# Patient Record
Sex: Male | Born: 1940 | Race: White | Hispanic: No | Smoking: Current every day smoker
Health system: Southern US, Community
[De-identification: ages and names within clinical notes are randomized; demographics above are authoritative.]

## PROBLEM LIST (undated history)

## (undated) DIAGNOSIS — Z9289 Personal history of other medical treatment: Secondary | ICD-10-CM

## (undated) DIAGNOSIS — G43909 Migraine, unspecified, not intractable, without status migrainosus: Secondary | ICD-10-CM

## (undated) DIAGNOSIS — Z9981 Dependence on supplemental oxygen: Secondary | ICD-10-CM

## (undated) DIAGNOSIS — I219 Acute myocardial infarction, unspecified: Secondary | ICD-10-CM

## (undated) DIAGNOSIS — G709 Myoneural disorder, unspecified: Secondary | ICD-10-CM

## (undated) DIAGNOSIS — G8929 Other chronic pain: Secondary | ICD-10-CM

## (undated) DIAGNOSIS — J189 Pneumonia, unspecified organism: Secondary | ICD-10-CM

## (undated) DIAGNOSIS — I10 Essential (primary) hypertension: Secondary | ICD-10-CM

## (undated) DIAGNOSIS — M545 Low back pain, unspecified: Secondary | ICD-10-CM

## (undated) DIAGNOSIS — I2510 Atherosclerotic heart disease of native coronary artery without angina pectoris: Secondary | ICD-10-CM

## (undated) DIAGNOSIS — T63691A Toxic effect of contact with other venomous marine animals, accidental (unintentional), initial encounter: Secondary | ICD-10-CM

## (undated) DIAGNOSIS — Z720 Tobacco use: Secondary | ICD-10-CM

## (undated) DIAGNOSIS — Z87442 Personal history of urinary calculi: Secondary | ICD-10-CM

## (undated) DIAGNOSIS — E7800 Pure hypercholesterolemia, unspecified: Secondary | ICD-10-CM

## (undated) DIAGNOSIS — J449 Chronic obstructive pulmonary disease, unspecified: Secondary | ICD-10-CM

## (undated) DIAGNOSIS — R918 Other nonspecific abnormal finding of lung field: Secondary | ICD-10-CM

## (undated) DIAGNOSIS — R0600 Dyspnea, unspecified: Secondary | ICD-10-CM

## (undated) DIAGNOSIS — C801 Malignant (primary) neoplasm, unspecified: Secondary | ICD-10-CM

## (undated) HISTORY — PX: COLONOSCOPY: SHX174

## (undated) HISTORY — PX: APPENDECTOMY: SHX54

## (undated) HISTORY — PX: CARDIAC CATHETERIZATION: SHX172

## (undated) HISTORY — PX: TOOTH EXTRACTION: SHX859

## (undated) HISTORY — PX: JOINT REPLACEMENT: SHX530

## (undated) HISTORY — PX: CATARACT EXTRACTION, BILATERAL: SHX1313

## (undated) HISTORY — PX: TOTAL KNEE ARTHROPLASTY: SHX125

## (undated) HISTORY — PX: TONSILLECTOMY: SUR1361

---
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
## 2015-05-21 ENCOUNTER — Ambulatory Visit

## 2015-05-21 NOTE — Telephone Encounter (Signed)
Phone Note -     Patient    Call back at [name] [phone]  Initial call taken by: [name],  May 21, 2015 10:09 AM  Initial Details of Call:  Patient called asking for a refill of his oxycodone. He would like to pick up the prescription on Thursday 04/27. PMP has been printed and giving to Dr. Vianne Bulls  The [place] on-line prescription monitoring program was reviewed prior to refilling this prescription and no inappropriate controlled subtances were dispensed.      Previous Appointment:  03/16/2015  Rusty Aus MD (BP ck)    02/28/2015  Rusty Aus MD (BP ck)    02/27/2015  Onnie Graham MD (.HPI)    10/23/2014  Onnie Graham MD (.HPI)        Next Appointment:  06/19/2015  [facility]  2:00 PM  06/29/2015  [name]  8:15 AM      Last BP:  128/68   (03/16/2015)    Last HGBA1C:      ()    BMP/CMP  Last BUN:     21    (02/27/2015),                Last EGFR:     65    (02/27/2015)  Last Creatinine:     1.1    (02/27/2015),      Last Sodium:   142    (02/27/2015)       Last Potassium:     4.5    (02/27/2015)    Last  Cholesterol:     141    (02/27/2015)     Last  LDL:     77    (02/27/2015),    Last  LDL Direct:     ()    Last  SGPT ALT:     16    (02/27/2015),  Last Albumin:     4.2    (02/27/2015)    CBC  Last WBC:     13.5    (12/05/2014)  Last Hgb:     14.6    (12/05/2014)  Last Platelet Count:     467    (12/05/2014)    Last Digoxin Level:        ()    Last TSH:       (),  Last TSH Ultra:   2.42    (06/25/2012)      Last Urine Drug Screen: on 02/27/2015.  Narcotic Medications:  OXYCODONE-ACETAMINOPHEN 7.5-325 MG TABS (OXYCODONE-ACETAMINOPHEN)  - Started: 01/24/2005    OXYCODONE-ACETAMINOPHEN 7.5-325 MG TABS (OXYCODONE-ACETAMINOPHEN)  - Started: 09/21/2006    OXYCODONE-ACETAMINOPHEN 7.5-325 MG TABS (OXYCODONE-ACETAMINOPHEN)  - Started: 07/06/2009    OXYCODONE-ACETAMINOPHEN 7.5-325 MG TABS (OXYCODONE-ACETAMINOPHEN)  -  Started: 09/12/2009    OXYCODONE-ACETAMINOPHEN 7.5-325 MG TABS (OXYCODONE-ACETAMINOPHEN)  - Started: 09/12/2009  - Stopped: 10/13/2013   OXYCODONE HCL 10 MG TABS (OXYCODONE HCL)  - Started: 10/13/2013    OXYCODONE HCL 10 MG TABS (OXYCODONE HCL)  - Started: 10/13/2013        Directives/Controlled Substance Agreement 03/25/2013 PATIENT WISHES TO BE DNR  06/24/2013 HEALTH CARE PROXY IS FRIEND SUSAN MACNULTY - NO PAPERWORK ON FILE  10/13/2013 CONTROLLED SUBSTANCE AGREEMENT ON FILE KD    PMP Appropriate __YES

## 2015-05-22 ENCOUNTER — Ambulatory Visit

## 2015-05-24 ENCOUNTER — Ambulatory Visit: Admitting: Internal Medicine

## 2015-05-28 ENCOUNTER — Ambulatory Visit

## 2015-06-12 ENCOUNTER — Ambulatory Visit

## 2015-06-18 ENCOUNTER — Ambulatory Visit

## 2015-06-18 NOTE — Telephone Encounter (Signed)
Phone Note -     Patient    Initial call taken by: Arther Dames,  Jun 18, 2015 10:52 AM  Initial Details of Call:  Patient calling to request refill on oxycodone-acetaminophen and is asking to pick up prescription on Thursday.      Follow-up #1  Details: The [place] on-line prescription monitoring program was reviewed prior to refilling this prescription and no inappropriate controlled subtances were dispensed.    By: Luvenia Redden LPN ~ Jun 18, 2015 11:21 AM

## 2015-06-19 ENCOUNTER — Ambulatory Visit

## 2015-06-19 NOTE — Telephone Encounter (Signed)
Phone Note -     Outgoing Call    Initial call taken by: Jacolyn Reedy,  Jun 19, 2015 2:48 PM  Call placed to: Patient  Detail: MISED APPT  Summary of Call: Pt had an appt today 06/19/15 with Dr. Janeann Forehand and did not show .[name] I callled and left a message and will follow up with a letter

## 2015-06-19 NOTE — Progress Notes (Signed)
[facility]   [address]   [place], [place] [address]  Office: [phone] Fax: [phone]                    06/19/2015    Isaiah White  [address]   [place], [place]  [address]      Dear [name]:    We had an appointment reserved for you today, and we were sorry  not to see you.    Since the doctor felt it was important to see you, please call our office  as soon as it is convenient so we may reschedule your appointment.      Sincerely,  Scientist, research (physical sciences) [facility]

## 2015-06-21 ENCOUNTER — Ambulatory Visit

## 2015-06-21 ENCOUNTER — Ambulatory Visit: Admitting: Internal Medicine

## 2015-06-28 ENCOUNTER — Ambulatory Visit

## 2015-06-28 ENCOUNTER — Ambulatory Visit: Admitting: Internal Medicine

## 2015-06-28 NOTE — Progress Notes (Signed)
he had cardiology consult for increase chest pain episodes  neg stress test and echo  i increase his cardizem for bp  Did you self refer to any specialists?    Age: 75 Years Old  Last Secure Message:  07/09/2011 - Secure Messaging: Email: [facility] OFFIE NOTE  Last PIN change:           Labs   No eye exam in record! LDL 77 on 01/31/2017HGBA1C =       Most Recent Visits     (Last rx/phone contact: 06/28/2015 )        03/16/2015  Rusty Aus MD (BP ck)    02/28/2015  Rusty Aus MD (BP ck)    02/27/2015  Onnie Graham MD (.HPI)    10/23/2014  Onnie Graham MD (.HPI)                                      Next Appointment(s) 06/29/2015  [facility]  8:15 AM      Immunizations  No flu vaccine this season      PCV13    03/25/2013      Td Booster 03/20/2008 Adacel/TDAP 10/25/2012  Last Flu  10/23/2014    Pneumovax  09/28/1995  12/18/2011    TD:   Adacel on 10/25/2012           Bone Density:  Date of Exam:    Colonoscopy: divertics. Date of Exam:  12/17/2006.  FIT Test:  Date:  .  Hemoccult: Negative Date:  06/24/2013.    Protocols Due  ZOSTAVAX.           Directives 03/25/2013 PATIENT WISHES TO BE DNR  06/24/2013 HEALTH CARE PROXY IS FRIEND SUSAN MACNULTY - NO PAPERWORK ON FILE  10/13/2013 CONTROLLED SUBSTANCE AGREEMENT ON FILE KD    The above represents the pre-visit preparations conducted for this patient.

## 2015-06-29 ENCOUNTER — Ambulatory Visit

## 2015-06-29 ENCOUNTER — Ambulatory Visit: Admitting: Internal Medicine

## 2015-06-29 LAB — HX COMPREHENSIVE METABOLIC PANELX
HX ALBUMIN: 3.9 g/dL (ref 3.2–4.9)
HX ALKALINE PHOSPHATASE: 130 U/L — ABNORMAL HIGH (ref 30.0–117.0)
HX ALT: 14 U/L (ref 0.0–40.0)
HX ANION GAP: 15 mmol/L (ref 9.0–19.0)
HX AST: 15 U/L (ref 0.0–37.0)
HX BICARBONATE: 24 mmol/L (ref 23.0–31.0)
HX BUN/CREAT RATIO: 13 (ref 12.0–20.0)
HX BUN: 14 mg/dL (ref 8.0–23.0)
HX CALCIUM: 9.3 mg/dL (ref 8.5–10.5)
HX CHLORIDE: 101 mmol/L (ref 98.0–110.0)
HX CREATININE: 1.1 mg/dL (ref 0.4–1.2)
HX GLOMERULAR FILTRATION RATE: 65
HX GLUCOSE: 110 mg/dL — ABNORMAL HIGH (ref 70.0–100.0)
HX HEMOLYSIS INDEX: 1 mg/dL (ref 0.0–50.0)
HX ICTERIC INDEX: 1 (ref 0.0–2.0)
HX LIPEMIC INDEX: 7 mg/dL (ref 0.0–40.0)
HX POTASSIUM: 4.8 mmol/L (ref 3.6–5.3)
HX SODIUM: 140 mmol/L (ref 137.0–146.0)
HX TOTAL BILIRUBIN: 0.4 mg/dL (ref 0.2–1.2)
HX TOTAL PROTEIN: 7.3 g/dL (ref 6.5–8.4)

## 2015-06-29 LAB — HX CBC W/DIFFX
HX ABSOLUTE BASOPHILS COUNT: 0.07 10*3/uL (ref 0.0–0.33)
HX ABSOLUTE EOSINOPHIL COUNT: 0.25 10*3/uL (ref 0.0–0.88)
HX ABSOLUTE LYMPHS COUNT: 3.04 10*3/uL (ref 0.99–4.84)
HX ABSOLUTE MONOCYTES COUNT: 0.76 10*3/uL (ref 0.18–1.21)
HX ABSOLUTE NEUTROPHIL CNT: 7.72 10*3/uL — ABNORMAL HIGH (ref 1.8–7.7)
HX BASOS: 0.6 %
HX EOS: 2.1 %
HX HEMATOCRIT: 43.4 % (ref 41.0–53.0)
HX HEMOGLOBIN: 13.8 g/dL (ref 13.5–17.5)
HX IMMATURE GRANULOCYTES: 0.5 % (ref 0.0–1.0)
HX LYMPHS: 25.5 %
HX MEAN CORP.HEMO.CONC.: 31.8 g/dL (ref 31.0–37.0)
HX MEAN CORPUSCULAR HEMOGLOBIN: 29.9 pg (ref 26.0–34.0)
HX MEAN CORPUSCULAR VOLUME: 93.9 fL (ref 80.0–100.0)
HX MEAN PLATELET VOLUME: 11 fL (ref 9.4–12.4)
HX MONOS: 6.4 %
HX PLATELET COUNT: 526 10*3/uL — ABNORMAL HIGH (ref 150.0–400.0)
HX POLYS: 64.9
HX RED BLOOD COUNT: 4.6 M/uL (ref 4.5–5.9)
HX RED CELL DISTRIBUTION WIDTH SD: 51.1 fL — ABNORMAL HIGH (ref 35.0–51.0)
HX WHITE BLOOD COUNT: 11.9 10*3/uL — ABNORMAL HIGH (ref 4.5–11.0)

## 2015-06-29 LAB — HX RTN. URINE WITH REFLEX
HX ASCORBIC ACID URINE: NEGATIVE
HX URINE BILE: NEGATIVE
HX URINE BLOOD: NEGATIVE
HX URINE ESTERASE: NEGATIVE
HX URINE GLUCOSE: NEGATIVE
HX URINE KETONES: NEGATIVE
HX URINE NITRITE: NEGATIVE
HX URINE PH: 6 (ref 5.0–8.0)
HX URINE PROTEIN: NEGATIVE
HX URINE SPECIFIC GRAVITY: 1.019 (ref 1.003–1.03)

## 2015-06-29 LAB — HX LIPID PANEL FASTINGX
HX CHD RISK ASSESMENT FACTORX: 3.1
HX CHOLESTEROL (LIPR): 134 mg/dL (ref <200)
HX HDL CHOLESTEROLX: 43 mg/dL (ref 35.0–55.0)
HX LDL CHOLESTEROLX: 73 mg/dL (ref <130)
HX NON HDL CHOLESTEROLX: 91 mg/dL (ref <130)
HX TRIGLYCERIDES: 92 mg/dL (ref <150)

## 2015-06-29 LAB — HX TOTAL IRON BINDING CAPACITYX
HX % SATURATION: 25 % (ref 20.0–50.0)
HX IRONX: 62 ug/dL (ref 45.0–160.0)
HX TOTAL IBC: 250 ug/dL (ref 228.0–428.0)
HX UNBOUND IRON BINDING CAPACITY: 188 ug/dL (ref 112.0–347.0)

## 2015-06-29 LAB — HX FERRITIN: HX FERRITIN: 87 ng/mL (ref 30.0–400.0)

## 2015-06-29 LAB — HX SED RATE: HX SED RATE: 61 mm/h — ABNORMAL HIGH (ref 0.0–20.0)

## 2015-06-29 NOTE — Progress Notes (Signed)
Primary Provider:  Onnie Graham MD      History of Present Illness:  the patient is here for annual comprehensive visit, including management and coordination of care of the problems below:    we reviewed self referrals to any new specialists: none.    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  so he reduced his cardizem back to 240mg .      Current Allergies:  1)  TAGAMET (Severe)  2)  * SPIRIVA (Moderate)  3)  * LOVASTATIN (Moderate)    Current Meds:    CARDIZEM CD 240 MG XR24H-CAP (DILTIAZEM HCL COATED BEADS) 1 tab q am for bp  METOPROLOL SUCCINATE 25 MG XR24H-TAB (METOPROLOL SUCCINATE) take one by mouth each day  SIMVASTATIN 10 MG TABS (SIMVASTATIN) 1 po once daily in evening for cholesterol  FERROUS SULFATE 325 MG TABS (FERROUS SULFATE) 1 by mouth daily;   buy "over the counter"  OXYCODONE HCL 10 MG TABS (OXYCODONE HCL) 1 tab every 8 hours for severe pain. partial fill is available.  GABAPENTIN 100 MG CAPS (GABAPENTIN) 2 tab two times daily to reduce pain  ISOSORBIDE MONONITRATE 20 MG TABS (ISOSORBIDE MONONITRATE) 1/2 tab in am and repeat after 8 hours two times daily  METOCLOPRAMIDE HCL 5 MG  TABS (METOCLOPRAMIDE HCL) 1 tab  before meals and at bedtime  OMEPRAZOLE 20 MG CPDR (OMEPRAZOLE) 1 tab two times daily for acid OTC  NORTRIPTYLINE HCL 10 MG CAPS (NORTRIPTYLINE HCL) 3 tab hs to reduce nerve pain from back  ASPIRIN 81 MG TBEC (ASPIRIN) take one by mouth daily  ONE-A-DAY MENS TABS (MULTIPLE VITAMIN)   NITROGLYCERIN 0.3 MG SUBL (NITROGLYCERIN) 1 po prn chest pain, repeat q5 minutes x2 for total of 3 doses prn  * CONTROLLED SUBSTANCE AGREEMENT ON FILE   FLUOCINONIDE 0.05 % CREA (FLUOCINONIDE) apply to rash on arms two times daily for 10 days    he drives does some errands most days.  he might be walking around in each store for ~ 15 minutes.  for total of 45 minutes of shopping  no other sustained walking.  limited by back pain and then short of breath.    no special diet.      Current Directives- Reviewed during today's visit  PATIENT WISHES TO BE DNR  HEALTH CARE PROXY IS FRIEND SUSAN MACNULTY - NO PAPERWORK ON FILE  CONTROLLED SUBSTANCE AGREEMENT ON FILE KD    Past Medical History  atypical chest pain most likely due to esophageal spasm.  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      Surgical History  bilat cataracts: 8/14  arthroscopic surgery left knee- dr Frederick Peers 12/09, 9/10, 9/11, 1/13  tonsils and appendix  several fatty growths removed from back and left arm by dr Freida Busman  4 epidural steroid injections 2006/2007  Social History  retired from remodeling carpentry  lives alone in apartment.   he has a lady friend at the lodge  rustproofer of autos.  no special diet  wife died unexpectedly Jan 26, 2023  he is estranged from his son  active at the [employer]    Risk Factors  Smoking Status:current daily smoker  Type: cigarettes   Smoking Comments: tobacco consumption depends on how much pain or how nervous he is  Amount: 1/2 pack a day  Counseled to quit/cut down: Yes  Passive smoke exposure:  Yes    Drug use: no  Alcohol use: previous  Alcohol Use Comments: he remains alcohol free since 1995    HIV high risk behavior: no  Family History MI in male age < 78: yes  Last Colonoscopy:   divertics. (12/17/2006 7:50:18 AM)        Review of Systems   General: rates energy as 8/10.  knee pain sometimes wakes him at night when he rolls over.    Eyes: Denies visual change or blurring, double vision. last eye exam < 2 years.    Ears/Nose/Throat: Denies ringing in the ears, earache.   Cardiovascular: Complains of shortness of breath. Denies palpitations. he gets chest pain with 20 minutes of activity.  he typically rests and goes away in 20-30 minutes.  rarely using NTG  no edema  Respiratory: Denies wheezing. morning cough with clear phlegm  Gastrointestinal: Denies nausea, vomiting, diarrhea, abdominal pain, abdominal bloating, constipation, mucous or blood in stools.   Genitourinary: Denies decreased urinary stream, urinary hesitancy. nocturia x 2  Musculoskeletal: knee brace helps him when he is walking.  due to instability  Skin: Denies rash.   Neurologic: Complains of parasthesias. Denies headaches, seizures, transient weakness, general weakness. numbness along outter aspect of left knee  he had 3 near falls in may due to knee instability.    Psychiatric: Denies anxiety, depression.   Endocrine: Denies flushing, polyuria, polydipsia.   Heme/Lymphatic: Denies easy bruising, unusual bleeding.     All other pertinent systems were reviewed and are negative   Vital Signs     Weight: 169.20 lb. Height: 72  in.    BMI: 23.03  BSA: 1.99    Wt chg: -8.80  Weight: 178 lbs   BMI: 24.23 on 03/16/2015  Temperature: 97.81 deg F.     Temp Site: oral    Pulse rate: 84    Pulse rhythm: regular  Pulse Ox (SpO2): 96  BP: 120/84, sitting left arm      Medications and Allergies Reviewed    Signed: Kristen D'Apolito LPN....June 29, 2015 8:07 AM  PHQ 2    Over the last 2 weeks, how often have you been bothered by any of the  following problems?  1. Little interest or pleasure in doing things:  0   - Not at all  2. Feeling down, depressed, or hopeless:  0   - Not at all        Physical Exam    General:      well developed, well nourished, in no acute distress.    Additional Exam:      Skin- some actinic changes on the forehead and hands.  TMs-normal bilaterally  Oropharynx-mild posterior injection without lesion, erythema or exudate  Neck-no bruit, adenopathy, or palpable thyroid abnormality.  Lungs-decreased breath sounds throughout, without wheeze, rales, or signs of consolidation.  Cor- heart rhythm is regular.  no  click, murmur, S3, or S4 appreciated.  Abdomen-normal bowel sounds present.  No mass, tenderness, or bruit appreciated.  Rectal-without palpable mass.  Stool is guaiac-negative.  Prostate-with no palpable enlargement.  There are no palpable masses.  Genital-there is no scrotal mass or hernia.  Extremities-there is no peripheral edema.  There is no discoloration.  Joints-there is mild deformity of both knees.  Neuro-there are 1+ DTRs in the upper extremities and I could not elicit DTRs in the lower extremities.  Patient is oriented x 3. there is some short-term memory deficits.  Psych-patient has appropriate mood and affect.  Patient answers all questions appropriately.    Test Results:     WBC:     13.5    (12/05/2014)  Hemoglobin:    14.6    (12/05/2014)  Hematocrit:    43.3    (12/05/2014)  PLatelets:    467    (12/05/2014)  TIBC:     252    (12/05/2014)  Ferritin:    96    (12/05/2014)  AST:     18    (02/27/2015)  ALT:     16    (02/27/2015)  LDL:     77    (02/27/2015)  HDL:     46    (02/27/2015)  Glucose:    97    (02/27/2015)  BUN:     21    (02/27/2015)  Creatinine:    1.1    (02/27/2015)  PSA:     0.56    (06/24/2013)       Assessment and Plan:        ~ CHEST PAIN, ATYPICAL (R07.89)   COPD (J44.9)   HIGH CHOLESTEROL (E78.70)   SYSTOLIC HYPERTENSION (I10)   THROMBOCYTOSIS (D47.3)   ABUSE, ALCOHOL, IN REMISSION  (F10.21)    Patient had workup several months ago with cardiology for an escalating pattern of exertional chest discomfort.  Pharmacologic stress test was unremarkable, and echocardiogram was normal.  His systolic blood pressure was elevated so cardiologist put him on a low dose of metoprolol succinate and after that he was noticing that he was feeling dizzy about an hour after his morning blood pressure meds so he self selected bback to Cardizem 240 mg a day.  Blood pressure is under good control at his visit today.  Lipids have been at goal with normal liver blood work.  Activities are limited first by back pain and then by some dyspnea on exertion.  He has some chronic thrombocytosis and had borderline iron stores so he has been on iron supplement with iron levels monitored every 6 months.  He remains on oxycodone for long-term management of chronic degenerative disc disease pain in the back and chronic pain in the left knee.  Alcohol abuse remains in remission.  No progress in smoking cessation or reduction and no motivation towards this goal.    He is requesting some help at home to manage his housekeeping as he did not meet some standard within the housing for cleanliness.  He is asking for a letter to say that he has medical conditions that limited his ability to keep his house kept up.  I am noticing a little bit of short-term memory issue having very little recollection her detail about visits to specialists including the cardiologist, ophthalmologist and hematologist.  I'm suspecting that there is some early dementia here.we contacted both his eye Dr. Larena Sox is dermatologist to try to get him updated appointments but apparently he  is in collections for nonpayment.  He promises to resolve this and schedule these appointments.  no exacerbations of COPD over this interval.    His lab work is up-to-date.  I made no specific changes to his regimen today  patient does not require any additional self managment  tools to manage chronic health conditions.  I would see him routinely in 3 months.    Problems Reviewed  Med Compliance and SE's: Pt is compliant with meds with no side effects   Patient/Caregiver understand instructions and plan.    Medications Removed Today:   CARDIZEM CD 360 MG XR24H-CAP (DILTIAZEM HCL COATED BEADS) 1 tab q am for bp    New/Revised  Medications Today:   CARDIZEM CD 240 MG XR24H-CAP (DILTIAZEM HCL COATED BEADS) 1 tab q am for bp        Patient Instructions    Return for follow-up in 3 month(s) follow-up 30.      THE STAFF WILL BE MAKING SOME APPOINTMENTS FOR YOU:  eye exam   [facility] dermatology- actinic keratoses    THE STAFF WILL BE BOOKING SOME TESTING FOR YOU:  labs today    WE HAVE MADE NO CHANGES IN YOUR MEDICATIONS:    WE DISCUSSED SOME STRATEGIES AND GOALS TO IMPROVE OR MAINTAIN YOUR OVERALL HEALTH:    The goals for lowering blood pressure are meant to reduce your future risk for stroke and heart disease. Your personal blood pressure goal is less than: 138/88.  is this goal being met: yes    the goal of treating cholesterol is to reduce your LDL or bad cholesterol to a level that has been shown to reduce risk of heart disease and stroke.   your LDL goal is < 100 mg/dl.  is this goal being met: yes        We have discussed some possible factors that may interfere with your full success with your goals: continued smoking

## 2015-06-29 NOTE — Telephone Encounter (Signed)
Phone Note -     Outgoing Call    Initial call taken by: Onnie Graham MD,  June 29, 2015 3:08 PM  Summary of Call: please call the patient.  His platelet count is starting to go up and I think that his iron levels may be going down.  I would like him to bring his bottle of iron pills into the office on Monday or tuesday to check and make sure that he is taking the appropriate dosing.    the rest of his lab work looks stable.      Follow-up #1  Details: Called and spoke to patient. He states since his last appt he has NOT taken any iron supplement. He states he could not find it in the store and has been trying to just eat food high in iron.  By: Luvenia Redden LPN ~ July 01, 1608 9:58 AM    Details: Patient walked into the office. He thought he was suppose to come into the office but I explained he was but to show me the iron bottle and he didnt have one to show me so he didnt need to come in. I wrote down on a piece of paper what he is suppose to take for iron and gave it to him. I asked if he was going to purchase it and he said he was. I took patient aside in a private area and asked if he was having trouble paying for his medications and he said he was. He states his prescriptions are very inexpensive through [employer] but the OTC's are adding up and he continues to be on more and more OTC's. Patient already has MVES in the home. He gets one day of cleaning. He states the housing dept is trying to get him more services. I asked patient to call and let me know if he couldnt afford the iron so we are aware and can see if there are any resources to help him. Patient verbalized understanding.  By: Luvenia Redden LPN ~ July 03, 9602 10:14 AM    Follow-up #2  Details: i have tried sending it as a RX to see if his insurance will cover it.    By: Onnie Graham MD ~ July 03, 2015 5:26 PM    Details: Left a message for patient to call the office. I called [facility] and the prescription will be $1.80 for him to  pick up using the pharmacy discount card.  By: Luvenia Redden LPN ~ July 05, 5407 9:26 AM    Follow-up #3  Details: Left 2nd message for patient to call me back.  By: Luvenia Redden LPN ~ July 05, 8117 9:05 AM    Details: Spoke with patient. He was able to pick up the iron. Patient also let me know he is moving to [place] with his daughter, He cannot afford to live here on his own anymore. Patient will come in for his last pain medication pick up on 07/19/15 and is officially moving on June 30th.  By: Luvenia Redden LPN ~ July 05, 1476 9:44 AM    Follow-up #4  Details: noted  By: Onnie Graham MD ~ July 06, 2015 12:46 PM    Prescriptions:  FERROUS SULFATE 325 MG TABS (FERROUS SULFATE) 1 tab q day  #30 x 12   Entered and Authorized by: Onnie Graham MD   Signed by: Onnie Graham MD on 07/03/2015   Method used: Electronically  to      [id number] (retail)     [address] [place], [place]  [address]     Ph: [phone]     Fax: [phone]   RxID: [id number]        Medications:  Changed medication from FERROUS SULFATE 325 MG TABS (FERROUS SULFATE) 1 by mouth daily;   buy "over the counter" to FERROUS SULFATE 325 MG TABS (FERROUS SULFATE) 1 tab q day - Signed  Rx of FERROUS SULFATE 325 MG TABS (FERROUS SULFATE) 1 tab q day;  #30 x 12;  Signed;  Entered by: Onnie Graham MD;  Authorized by: Onnie Graham MD;  Method used: Electronically to [id number] [address] [place], [place], [place]  [address], Ph: [phone], Fax: [phone]

## 2015-06-29 NOTE — Progress Notes (Signed)
Unable to schedule appointment at [facility]. Patient is in collections and will need to contact Lupita Leash before appointment can be scheduled.  Unable to schedule appointment at [facility]. Patient is in collections and will need to contact Marcelino Duster at [phone] before appointment can be scheduled.  Patient is aware and was provided with this contact information.

## 2015-07-02 ENCOUNTER — Ambulatory Visit: Admitting: Internal Medicine

## 2015-07-02 NOTE — Progress Notes (Signed)
[facility]   [address]  [place], [place] [address]  Office: [phone] Fax: [phone]              07/02/2015    JJ:OACZY JUDEA RICHES  [place], [place]  [address]  September 19, 1940    To Whom It May Concern,    Mr. Isaiah White has several several medical problems that make it difficult for him to perform his own housekeeping.  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      Thank you for your kind attention to this situation.        Sincerely,          Onnie Graham MD

## 2015-07-13 ENCOUNTER — Ambulatory Visit

## 2015-07-16 ENCOUNTER — Ambulatory Visit

## 2015-07-16 NOTE — Telephone Encounter (Signed)
Phone Note -       Initial call taken by: Arther Dames,  July 16, 2015 10:05 AM  Initial Details of Call:  Patient calling to request refill on Percocet and is asking to pick up prescription on Thursday morning.      Follow-up #1  Details: The [place] on-line prescription monitoring program was reviewed prior to refilling this prescription and no inappropriate controlled subtances were dispensed.    By: Luvenia Redden LPN ~ July 16, 2015 10:31 AM    Prescriptions:  OXYCODONE HCL 10 MG TABS (OXYCODONE HCL) 1 tab every 8 hours for severe pain. partial fill is available.  #84 x 0   Entered and Authorized by: Onnie Graham MD   Signed by: Onnie Graham MD on 07/19/2015   Method used: Print then Give to Patient   RxID: [id number]        Medications:  Rx of OXYCODONE HCL 10 MG TABS (OXYCODONE HCL) 1 tab every 8 hours for severe pain. partial fill is available.;  #84 x 0;  Signed;  Entered by: Onnie Graham MD;  Authorized by: Onnie Graham MD;  Method used: Print then Give to Patient

## 2015-08-20 ENCOUNTER — Ambulatory Visit

## 2016-02-19 DIAGNOSIS — M545 Low back pain: Secondary | ICD-10-CM | POA: Diagnosis not present

## 2016-02-19 DIAGNOSIS — G8929 Other chronic pain: Secondary | ICD-10-CM | POA: Diagnosis not present

## 2016-02-19 DIAGNOSIS — R252 Cramp and spasm: Secondary | ICD-10-CM | POA: Diagnosis not present

## 2016-02-19 DIAGNOSIS — M25562 Pain in left knee: Secondary | ICD-10-CM | POA: Diagnosis not present

## 2016-03-05 ENCOUNTER — Encounter (HOSPITAL_COMMUNITY): Payer: Self-pay | Admitting: *Deleted

## 2016-03-05 ENCOUNTER — Emergency Department (HOSPITAL_COMMUNITY)
Admission: EM | Admit: 2016-03-05 | Discharge: 2016-03-05 | Disposition: A | Discharge status: Home / Self Care | Payer: Medicare HMO | Attending: Emergency Medicine | Admitting: Emergency Medicine

## 2016-03-05 DIAGNOSIS — R040 Epistaxis: Secondary | ICD-10-CM

## 2016-03-05 DIAGNOSIS — F17200 Nicotine dependence, unspecified, uncomplicated: Secondary | ICD-10-CM | POA: Diagnosis not present

## 2016-03-05 DIAGNOSIS — Z7982 Long term (current) use of aspirin: Secondary | ICD-10-CM | POA: Insufficient documentation

## 2016-03-05 DIAGNOSIS — I2510 Atherosclerotic heart disease of native coronary artery without angina pectoris: Secondary | ICD-10-CM | POA: Insufficient documentation

## 2016-03-05 DIAGNOSIS — R69 Illness, unspecified: Secondary | ICD-10-CM | POA: Diagnosis not present

## 2016-03-05 DIAGNOSIS — Z79899 Other long term (current) drug therapy: Secondary | ICD-10-CM | POA: Diagnosis not present

## 2016-03-05 HISTORY — DX: Atherosclerotic heart disease of native coronary artery without angina pectoris: I25.10

## 2016-03-05 MED ORDER — SILVER NITRATE-POT NITRATE 75-25 % EX MISC
CUTANEOUS | Status: AC
  Filled 2016-03-05: qty 2

## 2016-03-05 MED ORDER — OXYMETAZOLINE HCL 0.05 % NA SOLN
1.0000 | Freq: Once | NASAL | Status: AC
  Administered 2016-03-05: 05:00:00 via NASAL

## 2016-03-05 MED ORDER — OXYMETAZOLINE HCL 0.05 % NA SOLN
NASAL | Status: AC
  Filled 2016-03-05: qty 15

## 2016-03-05 NOTE — ED Triage Notes (Signed)
Pt states he sneezed & nose started bleeding. Bleeding from right nare.

## 2016-03-05 NOTE — ED Notes (Signed)
Pt ambulatory to waiting room. Pt verbalized understanding of discharge instructions.   

## 2016-03-05 NOTE — ED Provider Notes (Signed)
[facility] DEPT Provider Note   CSN: [id number] Arrival date & time: 03/05/16  0441  Time seen 05:00 AM   History   Chief Complaint Chief Complaint  Patient presents with  . Epistaxis    HPI Bradley [name] is a 76 y.o. male.  HPI  patient states about 2 nights ago he sneezed and he had a small blood clot come out of his nostril. He had no more bleeding. He states he's been having some mild URI symptoms however he's been treating himself with over-the-counter cough medications. He states at 4 AM this morning he sneezed and felt something running and we went to the bathroom his nose was bleeding from his right nostril. He states he was unable to get the bleeding controlled. He has not had trouble with nosebleeds before that. He states he only takes a baby aspirin 81 mg a day. Patient states he has central heat with vents in the ceiling.  PCP [facility]  Past Medical History:  Diagnosis Date  . Coronary artery disease     There are no active problems to display for this patient.   Past Surgical History:  Procedure Laterality Date  . APPENDECTOMY    . TONSILLECTOMY    . TOOTH EXTRACTION         Home Medications    He does not know his medications, states his list is at [facility]  Prior to Admission medications   Medication Sig Start Date End Date Taking? Authorizing Provider  aspirin EC 81 MG tablet Take 81 mg by mouth daily.   Yes Historical Provider, MD  oxymorphone (OPANA ER) 10 MG 12 hr tablet Take 10 mg by mouth every 12 (twelve) hours.   Yes Historical Provider, MD    Family History No family history on file.  Social History Social History  Substance Use Topics  . Smoking status: Current Every Day Smoker  . Smokeless tobacco: Never Used  . Alcohol use No  lives with daughter Smokes 1/2-1 ppd   Allergies   Patient has no known allergies.   Review of Systems Review of Systems  All other systems reviewed and are negative.    Physical  Exam Updated Vital Signs BP 156/92 (BP Location: Right Arm)   Pulse 85   Temp 97.7 F (36.5 C) (Oral)   Resp 18   Ht 6' (1.829 m)   Wt 170 lb (77.1 kg)   SpO2 97%   BMI 23.06 kg/m   Vital signs normal except for hypertension   Physical Exam  Constitutional: He is oriented to person, place, and time. He appears well-developed and well-nourished.  Non-toxic appearance. He does not appear ill. No distress.  HENT:  Head: Normocephalic and atraumatic.  Right Ear: External ear normal.  Left Ear: External ear normal.  Mouth/Throat: Oropharynx is clear and moist and mucous membranes are normal.  Patient has some mild diffuse erythema of his nasal septum on the right. He does have a small amount of dried blood in his left nares. However patient states the nosebleed was from the right.  Eyes: Conjunctivae and EOM are normal.  Neck: Normal range of motion and full passive range of motion without pain.  Cardiovascular: Normal rate.   Pulmonary/Chest: Effort normal. No respiratory distress. He has no rhonchi. He exhibits no crepitus.  Abdominal: Normal appearance.  Musculoskeletal: Normal range of motion.  Moves all extremities well.   Neurological: He is alert and oriented to person, place, and time. He has normal strength.  No cranial nerve deficit.  Skin: Skin is warm, dry and intact. No rash noted. No erythema. No pallor.  Psychiatric: He has a normal mood and affect. His speech is normal and behavior is normal. His mood appears not anxious.  Nursing note and vitals reviewed.    ED Treatments / Results    Procedures Procedures (including critical care time)  Medications Ordered in ED Medications  silver nitrate applicators 35-59 % applicator (not administered)  oxymetazoline (AFRIN) 0.05 % nasal spray 1 spray ( Each Nare Given 03/05/16 0453)     Initial Impression / Assessment and Plan / ED Course  I have reviewed the triage vital signs and the nursing notes.  Pertinent labs  & imaging results that were available during my care of the patient were reviewed by me and considered in my medical decision making (see chart for details).  After my exam I placed a Afrin nasal pledget in his right nostril. At the time of my exam the right nostril was no longer bleeding.  06:00 AM Pt had no more bleeding, ambulated without rebleed.   06:20 AM I removed the pledget, there is a streak of blood on the end. I reinspected the nasal septum and see an area in the superior anterior septum with some punctate lesions suspicious for the bleeding source. Silver nitrate was applied, patient immediately sneezed about 4 times without rebleeding. An afrin pledget was replaced.  We discussed what to do if he rebleeds at home. Afrin pledget, pressure like an clothes pin and return if still bleeding.   Final Clinical Impressions(s) / ED Diagnoses   Final diagnoses:  Right-sided epistaxis    New Prescriptions Afrin nasal spray  Plan discharge  Rolland Porter, MD, Barbette Or, MD 03/05/16 [phone]

## 2016-03-05 NOTE — Discharge Instructions (Signed)
Take the cotton ball out in a few hours. If you start bleeding again, spray the afrin on a cotton ball and replace in your nostril and hold pressure on the end of your nose as we discussed. Return to the ED if that doesn't stop the bleeding.

## 2016-03-08 ENCOUNTER — Emergency Department (HOSPITAL_COMMUNITY)
Admission: EM | Admit: 2016-03-08 | Discharge: 2016-03-08 | Disposition: A | Discharge status: Home / Self Care | Payer: Medicare HMO | Attending: Emergency Medicine | Admitting: Emergency Medicine

## 2016-03-08 ENCOUNTER — Encounter (HOSPITAL_COMMUNITY): Payer: Self-pay | Admitting: Emergency Medicine

## 2016-03-08 DIAGNOSIS — R69 Illness, unspecified: Secondary | ICD-10-CM | POA: Diagnosis not present

## 2016-03-08 DIAGNOSIS — R040 Epistaxis: Secondary | ICD-10-CM | POA: Insufficient documentation

## 2016-03-08 DIAGNOSIS — I2510 Atherosclerotic heart disease of native coronary artery without angina pectoris: Secondary | ICD-10-CM | POA: Diagnosis not present

## 2016-03-08 DIAGNOSIS — F17200 Nicotine dependence, unspecified, uncomplicated: Secondary | ICD-10-CM | POA: Insufficient documentation

## 2016-03-08 DIAGNOSIS — Z7982 Long term (current) use of aspirin: Secondary | ICD-10-CM | POA: Diagnosis not present

## 2016-03-08 MED ORDER — OXYMETAZOLINE HCL 0.05 % NA SOLN
1.0000 | Freq: Once | NASAL | Status: AC
  Administered 2016-03-08: 1 via NASAL
  Filled 2016-03-08 (×2): qty 15

## 2016-03-08 NOTE — ED Notes (Signed)
Pt had to spit out some blood that has clotted in throat.

## 2016-03-08 NOTE — ED Notes (Signed)
Pt with cold wash cloth with pressure to nose at present.

## 2016-03-08 NOTE — ED Notes (Signed)
Pt with nose bleed for 30 minutes.  Pt states that he takes baby ASA everyday.  Pt states that he had a nosebleed 2 days ago and bleed for 30 minutes then as well.

## 2016-03-08 NOTE — ED Triage Notes (Signed)
Nose bleed that started 30 minutes PTA, on blood thinner

## 2016-03-08 NOTE — ED Notes (Signed)
ED Provider at bedside. 

## 2016-03-08 NOTE — Discharge Instructions (Signed)
Medications: Afrin  Treatment: If your nose begins to bleed again, apply pressure for 20-30 minutes. Blow your nose and blow out clots. At that time use as spray of Afrin in each nostril.  Follow-up: Please follow-up with your primary care provider for further evaluation and treatment of your nosebleeds.

## 2016-03-09 ENCOUNTER — Encounter (HOSPITAL_COMMUNITY): Payer: Self-pay | Admitting: Emergency Medicine

## 2016-03-09 ENCOUNTER — Emergency Department (HOSPITAL_COMMUNITY)
Admission: EM | Admit: 2016-03-09 | Discharge: 2016-03-10 | Disposition: A | Discharge status: Other Acute Inpatient Hospital | Payer: Medicare HMO | Source: Home / Self Care | Attending: Emergency Medicine | Admitting: Emergency Medicine

## 2016-03-09 DIAGNOSIS — I2510 Atherosclerotic heart disease of native coronary artery without angina pectoris: Secondary | ICD-10-CM | POA: Diagnosis not present

## 2016-03-09 DIAGNOSIS — Z7982 Long term (current) use of aspirin: Secondary | ICD-10-CM | POA: Diagnosis not present

## 2016-03-09 DIAGNOSIS — F17200 Nicotine dependence, unspecified, uncomplicated: Secondary | ICD-10-CM

## 2016-03-09 DIAGNOSIS — R040 Epistaxis: Secondary | ICD-10-CM

## 2016-03-09 DIAGNOSIS — R69 Illness, unspecified: Secondary | ICD-10-CM | POA: Diagnosis not present

## 2016-03-09 LAB — CBC WITH DIFFERENTIAL/PLATELET
Basophils Absolute: 0 10*3/uL (ref 0.0–0.1)
Basophils Relative: 0 %
EOS PCT: 1 %
Eosinophils Absolute: 0.1 10*3/uL (ref 0.0–0.7)
HCT: 39 % (ref 39.0–52.0)
Hemoglobin: 13 g/dL (ref 13.0–17.0)
LYMPHS PCT: 26 %
Lymphs Abs: 3.7 10*3/uL (ref 0.7–4.0)
MCH: 31 pg (ref 26.0–34.0)
MCHC: 33.3 g/dL (ref 30.0–36.0)
MCV: 93.1 fL (ref 78.0–100.0)
MONO ABS: 1 10*3/uL (ref 0.1–1.0)
Monocytes Relative: 7 %
Neutro Abs: 9.5 10*3/uL — ABNORMAL HIGH (ref 1.7–7.7)
Neutrophils Relative %: 66 %
PLATELETS: 466 10*3/uL — AB (ref 150–400)
RBC: 4.19 MIL/uL — ABNORMAL LOW (ref 4.22–5.81)
RDW: 14.7 % (ref 11.5–15.5)
WBC: 14.4 10*3/uL — ABNORMAL HIGH (ref 4.0–10.5)

## 2016-03-09 LAB — BASIC METABOLIC PANEL
Anion gap: 9 (ref 5–15)
BUN: 32 mg/dL — ABNORMAL HIGH (ref 6–20)
CO2: 24 mmol/L (ref 22–32)
Calcium: 9 mg/dL (ref 8.9–10.3)
Chloride: 104 mmol/L (ref 101–111)
Creatinine, Ser: 0.84 mg/dL (ref 0.61–1.24)
GFR calc Af Amer: >60 mL/min (ref >60)
GLUCOSE: 87 mg/dL (ref 65–99)
POTASSIUM: 4 mmol/L (ref 3.5–5.1)
Sodium: 137 mmol/L (ref 135–145)

## 2016-03-09 MED ORDER — COCAINE HCL 4 % EX SOLN
4.0000 mL | Freq: Once | CUTANEOUS | Status: AC
Start: 2016-03-09 — End: 2016-03-09
  Administered 2016-03-09: 4 mL via TOPICAL
  Filled 2016-03-09: qty 4

## 2016-03-09 MED ORDER — BACITRACIN ZINC 500 UNIT/GM EX OINT
TOPICAL_OINTMENT | CUTANEOUS | Status: AC
  Administered 2016-03-09: 3
  Filled 2016-03-09: qty 2.7

## 2016-03-09 MED ORDER — ACETAMINOPHEN 500 MG PO TABS
1000.0000 mg | ORAL_TABLET | Freq: Once | ORAL | Status: AC
  Administered 2016-03-09: 1000 mg via ORAL
  Filled 2016-03-09: qty 2

## 2016-03-09 MED ORDER — OXYMETAZOLINE HCL 0.05 % NA SOLN
1.0000 | Freq: Once | NASAL | Status: AC
  Administered 2016-03-09: 1 via NASAL
  Filled 2016-03-09: qty 15

## 2016-03-09 MED ORDER — ONDANSETRON HCL 4 MG/2ML IJ SOLN
4.0000 mg | Freq: Once | INTRAMUSCULAR | Status: AC
  Administered 2016-03-09: 4 mg via INTRAVENOUS
  Filled 2016-03-09: qty 2

## 2016-03-09 NOTE — ED Notes (Signed)
[facility] here to transport pt,  

## 2016-03-09 NOTE — ED Triage Notes (Signed)
Patient presents with nose bleed. Was seen yesterday for the same. Was sent home but bleeding started again this morning around 5 am and wont stop.

## 2016-03-09 NOTE — ED Provider Notes (Signed)
[facility] DEPT Provider Note   CSN: [id number] Arrival date & time: 03/08/16  2042     History   Chief Complaint Chief Complaint  Patient presents with  . Epistaxis    HPI Bradley [name] is a 76 y.o. male with history of CAD who presents with epistaxis. Patient was seen 2 days ago for another episode of epistaxis. Patient states his episode today lasted around 30 minutes despite pressure and Afrin use. Patient's bleeding has ceased since arrival to the ED. Patient has been coughing up and blowing out blood clots. Patient takes aspirin, but no other anticoagulants. Patient states he sleeps and heated room with central heat. Patient states he is in the process of trying to find humidifier to help with his symptoms. Patient denies any pain. He denies any chest pain, shortness of breath, abdominal pain, nausea, vomiting.  HPI  Past Medical History:  Diagnosis Date  . Coronary artery disease     There are no active problems to display for this patient.   Past Surgical History:  Procedure Laterality Date  . APPENDECTOMY    . TONSILLECTOMY    . TOOTH EXTRACTION         Home Medications    Prior to Admission medications   Medication Sig Start Date End Date Taking? Authorizing Provider  aspirin EC 81 MG tablet Take 81 mg by mouth daily.    Historical Provider, MD  oxymorphone (OPANA ER) 10 MG 12 hr tablet Take 10 mg by mouth every 12 (twelve) hours.    Historical Provider, MD    Family History History reviewed. No pertinent family history.  Social History Social History  Substance Use Topics  . Smoking status: Current Every Day Smoker  . Smokeless tobacco: Never Used  . Alcohol use No     Allergies   Patient has no known allergies.   Review of Systems Review of Systems  Constitutional: Negative for chills and fever.  HENT: Positive for nosebleeds. Negative for facial swelling and sore throat.   Respiratory: Negative for shortness of breath.     Cardiovascular: Negative for chest pain.  Gastrointestinal: Negative for abdominal pain, nausea and vomiting.  Skin: Negative for rash and wound.  Neurological: Negative for headaches.  Psychiatric/Behavioral: The patient is not nervous/anxious.      Physical Exam Updated Vital Signs BP 134/81 (BP Location: Right Arm)   Pulse 68   Temp 97.3 F (36.3 C) (Oral)   Resp 16   Ht 6' (1.829 m)   Wt 77.1 kg   SpO2 97%   BMI 23.06 kg/m   Physical Exam  Constitutional: He appears well-developed and well-nourished. No distress.  HENT:  Head: Normocephalic and atraumatic.  Mouth/Throat: Oropharynx is clear and moist. No oropharyngeal exudate.  No active bleeding or sites of bleeding visualized; blood coating the posterior pharynx  Eyes: Conjunctivae are normal. Pupils are equal, round, and reactive to light. Right eye exhibits no discharge. Left eye exhibits no discharge. No scleral icterus.  Neck: Normal range of motion. Neck supple. No thyromegaly present.  Cardiovascular: Normal rate, regular rhythm, normal heart sounds and intact distal pulses.  Exam reveals no gallop and no friction rub.   No murmur heard. Pulmonary/Chest: Effort normal and breath sounds normal. No stridor. No respiratory distress. He has no wheezes. He has no rales.  Abdominal: Soft. Bowel sounds are normal. He exhibits no distension. There is no tenderness. There is no rebound and no guarding.  Musculoskeletal: He exhibits no edema.  Lymphadenopathy:  He has no cervical adenopathy.  Neurological: He is alert. Coordination normal.  Skin: Skin is warm and dry. No rash noted. He is not diaphoretic. No pallor.  Psychiatric: He has a normal mood and affect.  Nursing note and vitals reviewed.    ED Treatments / Results  Labs (all labs ordered are listed, but only abnormal results are displayed) Labs Reviewed - No data to display  EKG  EKG Interpretation None       Radiology No results  found.  Procedures Procedures (including critical care time)  Medications Ordered in ED Medications  oxymetazoline (AFRIN) 0.05 % nasal spray 1 spray (1 spray Each Nare Given 03/08/16 2236)     Initial Impression / Assessment and Plan / ED Course  I have reviewed the triage vital signs and the nursing notes.  Pertinent labs & imaging results that were available during my care of the patient were reviewed by me and considered in my medical decision making (see chart for details).     Patient's episode axis stopped in the ED with pressure with a wet washcloth. Patient was able to blow out many clots in the ED. Patient given Afrin. Patient no longer bleeding. Patient to follow-up with primary care provider for further evaluation of recurrent nosebleeds. Encouraged patient to increase humidity in the room and the proper way to use Afrin at home and stop nosebleeds in the future. Patient has no other complaints and is otherwise well-appearing. Return precautions discussed. Patient understands and agrees with plan. Patient vitals stable throughout ED course and discharged in satisfactory condition. I discussed patient case with Dr. Rogene Houston who is in agreement with plan.  Final Clinical Impressions(s) / ED Diagnoses   Final diagnoses:  Epistaxis    New Prescriptions Discharge Medication List as of 03/08/2016 11:27 PM       Frederica Kuster, PA-C 03/09/16 9971    Fredia Sorrow, MD 03/10/16 0021

## 2016-03-09 NOTE — ED Notes (Signed)
Pt continues to have bleeding from right nare, Dr Rogene Houston notified,

## 2016-03-09 NOTE — ED Provider Notes (Signed)
[facility] DEPT Provider Note   CSN: [id number] Arrival date & time: 03/09/16  1353     History   Chief Complaint Chief Complaint  Patient presents with  . Epistaxis    HPI Bradley [name] is a 76 y.o. male.  Patient seen yesterday in fast track for right-sided nosebleed. That was stopped with pressure and using Afrin. Patient continued to have difficulties with intermittent bleeding patient used Afrin twice today has had dull pressure to the nose 3 times. With stop temporarily then would restart. Patient is not on any blood thinners. Patient is not hypertensive. Patient does not have difficulties with nosebleeds in the past. No history of any injury or trauma.      Past Medical History:  Diagnosis Date  . Coronary artery disease     There are no active problems to display for this patient.   Past Surgical History:  Procedure Laterality Date  . APPENDECTOMY    . TONSILLECTOMY    . TOOTH EXTRACTION         Home Medications    Prior to Admission medications   Medication Sig Start Date End Date Taking? Authorizing Provider  ADVAIR HFA 115-21 MCG/ACT inhaler Inhale 2 puffs into the lungs every 12 (twelve) hours. 01/29/16  Yes Historical Provider, MD  aspirin EC 81 MG tablet Take 81 mg by mouth daily.   Yes Historical Provider, MD  diltiazem (CARDIZEM CD) 240 MG 24 hr capsule Take 1 capsule by mouth daily. 12/23/15  Yes Historical Provider, MD  ferrous sulfate 325 (65 FE) MG tablet Take 325 mg by mouth daily. 12/10/15  Yes Historical Provider, MD  isosorbide mononitrate (ISMO,MONOKET) 20 MG tablet Take 1 tablet by mouth 2 (two) times daily. 12/18/15  Yes Historical Provider, MD  metoCLOPramide (REGLAN) 5 MG tablet Take 1 tablet by mouth 4 (four) times daily -  before meals and at bedtime. 01/25/16  Yes Historical Provider, MD  metoprolol succinate (TOPROL-XL) 25 MG 24 hr tablet Take 1 tablet by mouth daily. 02/19/16  Yes Historical Provider, MD  nortriptyline (PAMELOR)  10 MG capsule Take 3 capsules by mouth at bedtime. 02/10/16  Yes Historical Provider, MD  omeprazole (PRILOSEC) 20 MG capsule Take 1 capsule by mouth 2 (two) times daily. 02/08/16  Yes Historical Provider, MD  Oxycodone HCl 10 MG TABS Take 1 tablet by mouth 3 (three) times daily. 02/28/16  Yes Historical Provider, MD  oxymorphone (OPANA ER) 10 MG 12 hr tablet Take 10 mg by mouth every 12 (twelve) hours.   Yes Historical Provider, MD  simvastatin (ZOCOR) 10 MG tablet Take 10 mg by mouth daily. 12/23/15  Yes Historical Provider, MD  VENTOLIN HFA 108 (90 Base) MCG/ACT inhaler Inhale 2 puffs into the lungs every 4 (four) hours as needed. 12/11/15  Yes Historical Provider, MD    Family History No family history on file.  Social History Social History  Substance Use Topics  . Smoking status: Current Every Day Smoker  . Smokeless tobacco: Never Used  . Alcohol use No     Allergies   Patient has no known allergies.   Review of Systems Review of Systems  Constitutional: Negative for fever.  HENT: Positive for nosebleeds. Negative for congestion and trouble swallowing.   Eyes: Negative for redness.  Respiratory: Negative for shortness of breath.   Cardiovascular: Negative for chest pain.  Gastrointestinal: Negative for abdominal pain.  Musculoskeletal: Negative for neck pain.  Neurological: Negative for headaches.  Hematological: Does not bruise/bleed easily.  Psychiatric/Behavioral:  Negative for confusion.     Physical Exam Updated Vital Signs BP 128/78 (BP Location: Left Arm)   Pulse 68   Temp 97.8 F (36.6 C) (Oral)   Resp 17   Ht 6' (1.829 m)   Wt 77.1 kg   SpO2 99%   BMI 23.06 kg/m   Physical Exam  Constitutional: He is oriented to person, place, and time. He appears well-developed and well-nourished. No distress.  HENT:  Head: Normocephalic and atraumatic.  Down the back of the throat. Bleeding from the right nares appears to be anterior.  Eyes: EOM are normal. Pupils  are equal, round, and reactive to light.  Neck: Normal range of motion. Neck supple.  Cardiovascular: Normal rate and regular rhythm.   Pulmonary/Chest: Effort normal and breath sounds normal. No respiratory distress.  Abdominal: Soft. Bowel sounds are normal.  Musculoskeletal: Normal range of motion.  Neurological: He is alert and oriented to person, place, and time. No cranial nerve deficit. He exhibits normal muscle tone. Coordination normal.  Skin: Skin is warm.  Nursing note and vitals reviewed.    ED Treatments / Results  Labs (all labs ordered are listed, but only abnormal results are displayed) Labs Reviewed  BASIC METABOLIC PANEL - Abnormal; Notable for the following:       Result Value   BUN 32 (*)    All other components within normal limits  CBC WITH DIFFERENTIAL/PLATELET - Abnormal; Notable for the following:    WBC 14.4 (*)    RBC 4.19 (*)    Platelets 466 (*)    Neutro Abs 9.5 (*)    All other components within normal limits   Results for orders placed or performed during the hospital encounter of 32/35/57  Basic metabolic panel  Result Value Ref Range   Sodium 137 135 - 145 mmol/L   Potassium 4.0 3.5 - 5.1 mmol/L   Chloride 104 101 - 111 mmol/L   CO2 24 22 - 32 mmol/L   Glucose, Bld 87 65 - 99 mg/dL   BUN 32 (H) 6 - 20 mg/dL   Creatinine, Ser 0.84 0.61 - 1.24 mg/dL   Calcium 9.0 8.9 - 10.3 mg/dL   GFR calc non Af Amer >60 >60 mL/min   GFR calc Af Amer >60 >60 mL/min   Anion gap 9 5 - 15  CBC with Differential/Platelet  Result Value Ref Range   WBC 14.4 (H) 4.0 - 10.5 K/uL   RBC 4.19 (L) 4.22 - 5.81 MIL/uL   Hemoglobin 13.0 13.0 - 17.0 g/dL   HCT 39.0 39.0 - 52.0 %   MCV 93.1 78.0 - 100.0 fL   MCH 31.0 26.0 - 34.0 pg   MCHC 33.3 30.0 - 36.0 g/dL   RDW 14.7 11.5 - 15.5 %   Platelets 466 (H) 150 - 400 K/uL   Neutrophils Relative % 66 %   Neutro Abs 9.5 (H) 1.7 - 7.7 K/uL   Lymphocytes Relative 26 %   Lymphs Abs 3.7 0.7 - 4.0 K/uL   Monocytes  Relative 7 %   Monocytes Absolute 1.0 0.1 - 1.0 K/uL   Eosinophils Relative 1 %   Eosinophils Absolute 0.1 0.0 - 0.7 K/uL   Basophils Relative 0 %   Basophils Absolute 0.0 0.0 - 0.1 K/uL     EKG  EKG Interpretation None       Radiology No results found.  Procedures Procedures (including critical care time)  Right nares with placement of 5.5 cm Rhino Rocket was  greased with bacitracin ointment had a time. Balloon blown up. Did not have a longer [employer] available unfortunately. This initially seemed to help the bleeding but then bleeding has persisted. Patient tolerated the procedure well.  Medications Ordered in ED Medications  bacitracin 500 UNIT/GM ointment (3 application  Given 07/20/74 1847)     Initial Impression / Assessment and Plan / ED Course  I have reviewed the triage vital signs and the nursing notes.  Pertinent labs & imaging results that were available during my care of the patient were reviewed by me and considered in my medical decision making (see chart for details).     Patient with persistent right-sided nosebleed despite the [employer] placement. Unfortunately only had a length of 5.5. Nothing longer rest of number of the kit is empty. Discussed with ear nose and throat on call at [facility] Dr. Iran Planas. Also discussed with the ED physician Dr.Knot, patient will be transferred to the ED at [facility] and they will contact her nose and throat if bleeding is persisted.  Final Clinical Impressions(s) / ED Diagnoses   Final diagnoses:  Epistaxis  Right-sided epistaxis    New Prescriptions New Prescriptions   No medications on file     Fredia Sorrow, MD 03/09/16 2012

## 2016-03-09 NOTE — ED Notes (Addendum)
Pt resting quietly in bed. Pt reports nose is still bleeding. MD aware.

## 2016-03-09 NOTE — ED Provider Notes (Signed)
[facility] DEPT Provider Note   CSN: [id number] Arrival date & time: 03/09/16  1353     History   Chief Complaint Chief Complaint  Patient presents with  . Epistaxis    HPI Bradley [name] is a 76 y.o. male.  The history is provided by the patient.  Epistaxis   This is a new problem. The current episode started 12 to 24 hours ago. The problem occurs constantly. The problem has not changed since onset.The problem is associated with an unknown factor. The bleeding has been from the right nare. He has tried applying pressure, a nasal tampon and vasoconstrictors for the symptoms. The treatment provided no relief.    Past Medical History:  Diagnosis Date  . Coronary artery disease     There are no active problems to display for this patient.   Past Surgical History:  Procedure Laterality Date  . APPENDECTOMY    . TONSILLECTOMY    . TOOTH EXTRACTION         Home Medications    Prior to Admission medications   Medication Sig Start Date End Date Taking? Authorizing Provider  ADVAIR HFA 115-21 MCG/ACT inhaler Inhale 2 puffs into the lungs every 12 (twelve) hours. 01/29/16  Yes Historical Provider, MD  aspirin EC 81 MG tablet Take 81 mg by mouth daily.   Yes Historical Provider, MD  diltiazem (CARDIZEM CD) 240 MG 24 hr capsule Take 1 capsule by mouth daily. 12/23/15  Yes Historical Provider, MD  ferrous sulfate 325 (65 FE) MG tablet Take 325 mg by mouth daily. 12/10/15  Yes Historical Provider, MD  isosorbide mononitrate (ISMO,MONOKET) 20 MG tablet Take 1 tablet by mouth 2 (two) times daily. 12/18/15  Yes Historical Provider, MD  metoCLOPramide (REGLAN) 5 MG tablet Take 1 tablet by mouth 4 (four) times daily -  before meals and at bedtime. 01/25/16  Yes Historical Provider, MD  metoprolol succinate (TOPROL-XL) 25 MG 24 hr tablet Take 1 tablet by mouth daily. 02/19/16  Yes Historical Provider, MD  nortriptyline (PAMELOR) 10 MG capsule Take 3 capsules by mouth at bedtime.  02/10/16  Yes Historical Provider, MD  omeprazole (PRILOSEC) 20 MG capsule Take 1 capsule by mouth 2 (two) times daily. 02/08/16  Yes Historical Provider, MD  Oxycodone HCl 10 MG TABS Take 1 tablet by mouth 3 (three) times daily. 02/28/16  Yes Historical Provider, MD  oxymorphone (OPANA ER) 10 MG 12 hr tablet Take 10 mg by mouth every 12 (twelve) hours.   Yes Historical Provider, MD  simvastatin (ZOCOR) 10 MG tablet Take 10 mg by mouth daily. 12/23/15  Yes Historical Provider, MD  VENTOLIN HFA 108 (90 Base) MCG/ACT inhaler Inhale 2 puffs into the lungs every 4 (four) hours as needed. 12/11/15  Yes Historical Provider, MD    Family History No family history on file.  Social History Social History  Substance Use Topics  . Smoking status: Current Every Day Smoker  . Smokeless tobacco: Never Used  . Alcohol use No     Allergies   Patient has no known allergies.   Review of Systems Review of Systems  HENT: Positive for nosebleeds.   All other systems reviewed and are negative.    Physical Exam Updated Vital Signs BP 162/82   Pulse 75   Temp 97.8 F (36.6 C) (Axillary)   Resp 18   Ht 6' (1.829 m)   Wt 170 lb (77.1 kg)   SpO2 100%   BMI 23.06 kg/m   Physical Exam  Constitutional: He is oriented to person, place, and time. He appears well-developed and well-nourished. No distress.  HENT:  Head: Normocephalic and atraumatic.  Nose: Epistaxis (on right with clots and oozing blood) is observed.  Eyes: Conjunctivae are normal.  Neck: Neck supple. No tracheal deviation present.  Cardiovascular: Normal rate and regular rhythm.   Pulmonary/Chest: Effort normal. No respiratory distress.  Abdominal: Soft. He exhibits no distension.  Neurological: He is alert and oriented to person, place, and time.  Skin: Skin is warm and dry.  Psychiatric: He has a normal mood and affect.     ED Treatments / Results  Labs (all labs ordered are listed, but only abnormal results are  displayed) Labs Reviewed  BASIC METABOLIC PANEL - Abnormal; Notable for the following:       Result Value   BUN 32 (*)    All other components within normal limits  CBC WITH DIFFERENTIAL/PLATELET - Abnormal; Notable for the following:    WBC 14.4 (*)    RBC 4.19 (*)    Platelets 466 (*)    Neutro Abs 9.5 (*)    All other components within normal limits    EKG  EKG Interpretation None       Radiology No results found.  Procedures Procedures (including critical care time)  Medications Ordered in ED Medications  bacitracin 500 UNIT/GM ointment (3 application  Given 08/15/92 1847)  ondansetron (ZOFRAN) injection 4 mg (4 mg Intravenous Given 03/09/16 2026)  oxymetazoline (AFRIN) 0.05 % nasal spray 1 spray (1 spray Each Nare Given 03/09/16 2148)  acetaminophen (TYLENOL) tablet 1,000 mg (1,000 mg Oral Given 03/09/16 2225)  cocaine 4 % topical solution 4 mL (4 mLs Topical Given 03/09/16 2332)  cephALEXin (KEFLEX) capsule 500 mg (500 mg Oral Given 03/10/16 0013)     Initial Impression / Assessment and Plan / ED Course  I have reviewed the triage vital signs and the nursing notes.  Pertinent labs & imaging results that were available during my care of the patient were reviewed by me and considered in my medical decision making (see chart for details).     76 y.o. male presents with ongoing epistaxis refractory to 5 cm balloon packing and afrin from [employer]. Inserted 7cm balloon here with relief but re-bleeding occurred. Pt was packed temporarily with cotton soaked in cocaine solution which slowed bleeding and second balloon packing was performed with posterior balloon inflation and larger device which achieved hemostasis. Discussed with Dr Janace Hoard who will see Pt in clinic for removal in 3-5 days. Keflex for infection ppx. Return precautions discussed for worsening or new concerning symptoms.   Final Clinical Impressions(s) / ED Diagnoses   Final diagnoses:  Epistaxis  Right-sided  epistaxis    New Prescriptions New Prescriptions   CEPHALEXIN (KEFLEX) 500 MG CAPSULE    Take 1 capsule (500 mg total) by mouth 2 (two) times daily.     Leo Grosser, MD 03/10/16 [phone]

## 2016-03-09 NOTE — ED Notes (Signed)
Report given to charge rn at [facility],

## 2016-03-09 NOTE — ED Notes (Signed)
Report given to [facility] 

## 2016-03-09 NOTE — ED Notes (Signed)
Pt continues to bleed from right nare, Dr Rogene Houston at bedside,

## 2016-03-09 NOTE — ED Notes (Signed)
Pt says epistaxis has lessened and only show spotting on tissue.  Pt says the central air at his facility is very dry.

## 2016-03-09 NOTE — ED Notes (Signed)
Pt reports that his nose is still bleeding. MD aware.

## 2016-03-10 ENCOUNTER — Encounter (HOSPITAL_COMMUNITY): Payer: Self-pay

## 2016-03-10 ENCOUNTER — Emergency Department (HOSPITAL_COMMUNITY)
Admission: EM | Admit: 2016-03-10 | Discharge: 2016-03-10 | Disposition: A | Discharge status: Home / Self Care | Payer: Medicare HMO | Attending: Emergency Medicine | Admitting: Emergency Medicine

## 2016-03-10 DIAGNOSIS — F17200 Nicotine dependence, unspecified, uncomplicated: Secondary | ICD-10-CM | POA: Insufficient documentation

## 2016-03-10 DIAGNOSIS — R040 Epistaxis: Secondary | ICD-10-CM | POA: Diagnosis not present

## 2016-03-10 DIAGNOSIS — Z7982 Long term (current) use of aspirin: Secondary | ICD-10-CM | POA: Insufficient documentation

## 2016-03-10 DIAGNOSIS — R030 Elevated blood-pressure reading, without diagnosis of hypertension: Secondary | ICD-10-CM | POA: Diagnosis not present

## 2016-03-10 DIAGNOSIS — I2510 Atherosclerotic heart disease of native coronary artery without angina pectoris: Secondary | ICD-10-CM | POA: Insufficient documentation

## 2016-03-10 DIAGNOSIS — R69 Illness, unspecified: Secondary | ICD-10-CM | POA: Diagnosis not present

## 2016-03-10 LAB — CBG MONITORING, ED: GLUCOSE-CAPILLARY: 98 mg/dL (ref 65–99)

## 2016-03-10 MED ORDER — CEPHALEXIN 250 MG PO CAPS
500.0000 mg | ORAL_CAPSULE | Freq: Once | ORAL | Status: AC
  Administered 2016-03-10: 500 mg via ORAL
  Filled 2016-03-10: qty 2

## 2016-03-10 MED ORDER — ONDANSETRON 4 MG PO TBDP
4.0000 mg | ORAL_TABLET | Freq: Once | ORAL | Status: AC
  Administered 2016-03-10: 4 mg via ORAL
  Filled 2016-03-10: qty 1

## 2016-03-10 MED ORDER — CEPHALEXIN 500 MG PO CAPS: 500.0000 mg | ORAL_CAPSULE | Freq: Two times a day (BID) | ORAL | 0 refills | Status: DC

## 2016-03-10 NOTE — ED Notes (Signed)
Pt did not need anything at this time  

## 2016-03-10 NOTE — ED Notes (Signed)
Pt stable, ambulatory, states understanding of discharge instructions 

## 2016-03-10 NOTE — ED Triage Notes (Signed)
Per EMS - pt seen yesterday for nosebleed, rhino rocket placed. Pt states nose started bleeding around 2pm today. Nose still slowly bleeding at this time. VSS.

## 2016-03-10 NOTE — ED Provider Notes (Signed)
[facility] DEPT Provider Note   CSN: [id number] Arrival date & time: 03/10/16  1650     History   Chief Complaint Chief Complaint  Patient presents with  . Epistaxis    HPI Bradley [name] is a 76 y.o. male.  Patient is a 76 year old male returning for recurrent nosebleed. He has been seen multiple times in the last 7 days and most recently last night. Patient had refractory nosebleed requiring removal of a 5 cm packing and placement of 7 cm. He continued to bleed and that was removed, a cocaine pledget was placed and then a posterior packing was placed. Patient did well until 2 PM this afternoon when he started having bleeding from the right nare again as well as some bleeding from the left. Patient did not have a syringe to inflate balloons any larger but did put his head down and apply pressure. Now the bleeding has stopped.  Patient has not eaten for several days due to ongoing bleeding and is complaining of some mild nausea. He had blood work done yesterday which was reassuring. He takes no anticoagulation and has taken his blood pressure medication today.   The history is provided by the patient.  Epistaxis      Past Medical History:  Diagnosis Date  . Coronary artery disease     There are no active problems to display for this patient.   Past Surgical History:  Procedure Laterality Date  . APPENDECTOMY    . TONSILLECTOMY    . TOOTH EXTRACTION         Home Medications    Prior to Admission medications   Medication Sig Start Date End Date Taking? Authorizing Provider  ADVAIR HFA 115-21 MCG/ACT inhaler Inhale 2 puffs into the lungs every 12 (twelve) hours. 01/29/16   Historical Provider, MD  aspirin EC 81 MG tablet Take 81 mg by mouth daily.    Historical Provider, MD  cephALEXin (KEFLEX) 500 MG capsule Take 1 capsule (500 mg total) by mouth 2 (two) times daily. 03/10/16 03/15/16  Leo Grosser, MD  diltiazem (CARDIZEM CD) 240 MG 24 hr capsule Take 1 capsule by  mouth daily. 12/23/15   Historical Provider, MD  ferrous sulfate 325 (65 FE) MG tablet Take 325 mg by mouth daily. 12/10/15   Historical Provider, MD  isosorbide mononitrate (ISMO,MONOKET) 20 MG tablet Take 1 tablet by mouth 2 (two) times daily. 12/18/15   Historical Provider, MD  metoCLOPramide (REGLAN) 5 MG tablet Take 1 tablet by mouth 4 (four) times daily -  before meals and at bedtime. 01/25/16   Historical Provider, MD  metoprolol succinate (TOPROL-XL) 25 MG 24 hr tablet Take 1 tablet by mouth daily. 02/19/16   Historical Provider, MD  nortriptyline (PAMELOR) 10 MG capsule Take 3 capsules by mouth at bedtime. 02/10/16   Historical Provider, MD  omeprazole (PRILOSEC) 20 MG capsule Take 1 capsule by mouth 2 (two) times daily. 02/08/16   Historical Provider, MD  Oxycodone HCl 10 MG TABS Take 1 tablet by mouth 3 (three) times daily. 02/28/16   Historical Provider, MD  oxymorphone (OPANA ER) 10 MG 12 hr tablet Take 10 mg by mouth every 12 (twelve) hours.    Historical Provider, MD  simvastatin (ZOCOR) 10 MG tablet Take 10 mg by mouth daily. 12/23/15   Historical Provider, MD  VENTOLIN HFA 108 (90 Base) MCG/ACT inhaler Inhale 2 puffs into the lungs every 4 (four) hours as needed. 12/11/15   Historical Provider, MD    Family History  History reviewed. No pertinent family history.  Social History Social History  Substance Use Topics  . Smoking status: Current Every Day Smoker  . Smokeless tobacco: Never Used  . Alcohol use No     Allergies   Patient has no known allergies.   Review of Systems Review of Systems  HENT: Positive for nosebleeds.   All other systems reviewed and are negative.    Physical Exam Updated Vital Signs BP 165/85   Pulse 87   Temp 98.1 F (36.7 C) (Oral)   Resp 16   SpO2 98%   Physical Exam  Constitutional: He is oriented to person, place, and time. He appears well-developed and well-nourished. No distress.  HENT:  Head: Normocephalic and atraumatic.    Mouth/Throat: Oropharynx is clear and moist.  Dried blood in the posterior pharynx.  Posterior packing placed without acute bleeding.  No bleeding from the left nare or blood clot present  Eyes: Conjunctivae and EOM are normal. Pupils are equal, round, and reactive to light.  Neck: Normal range of motion. Neck supple.  Cardiovascular: Normal rate, regular rhythm and intact distal pulses.   No murmur heard. Pulmonary/Chest: Effort normal and breath sounds normal. No respiratory distress. He has no wheezes. He has no rales.  Abdominal: Soft. He exhibits no distension. There is no tenderness. There is no rebound and no guarding.  Musculoskeletal: Normal range of motion. He exhibits no edema or tenderness.  Neurological: He is alert and oriented to person, place, and time.  Skin: Skin is warm and dry. No rash noted. No erythema.  Psychiatric: He has a normal mood and affect. His behavior is normal.  Nursing note and vitals reviewed.    ED Treatments / Results  Labs (all labs ordered are listed, but only abnormal results are displayed) Labs Reviewed  CBG MONITORING, ED    EKG  EKG Interpretation None       Radiology No results found.  Procedures Procedures (including critical care time)  Medications Ordered in ED Medications  ondansetron (ZOFRAN-ODT) disintegrating tablet 4 mg (not administered)     Initial Impression / Assessment and Plan / ED Course  I have reviewed the triage vital signs and the nursing notes.  Pertinent labs & imaging results that were available during my care of the patient were reviewed by me and considered in my medical decision making (see chart for details).    Patient is a 76 year old male returning for recurrent nosebleed. Patient has been seen multiple times this week for refractory nosebleed. He was seen last night with a posterior packing placed. He been hemostatic until 2 PM this afternoon when he spontaneously started bleeding initially  out of the right [place] Adam and then coming out of the left. He applied pressure and upon arrival here bleeding has stopped. He has no active bleeding in the posterior pharynx or left Carlton Adam. Feel most likely this still coming from the right nasal passage. Balloons inflated slightly larger. Will observe. Patient's blood pressure is 545 systolically and he has taken his blood pressure medication today. Patient is complaining of some nausea but states he has not eaten in the last few days due to the nosebleed. Will give Zofran and if patient does not bleed will feed him. He had lab work done yesterday with a reassuring hemoglobin. As within normal limits.  12:31 AM Patient feeling better and has had no further bleeding. Patient was discharged home with packing to remain in place. Final Clinical Impressions(s) / ED Diagnoses  Final diagnoses:  Epistaxis, recurrent    New Prescriptions New Prescriptions   No medications on file     Blanchie Dessert, MD 03/11/16 [phone]

## 2016-03-10 NOTE — ED Notes (Signed)
Cab voucher approved by [employer]. Patient requires transport home which is in [place]. Arrived here via [facility] as ED-to-ED transfer from [facility].

## 2016-03-13 ENCOUNTER — Encounter (HOSPITAL_COMMUNITY): Payer: Self-pay

## 2016-03-13 ENCOUNTER — Emergency Department (HOSPITAL_COMMUNITY)
Admission: EM | Admit: 2016-03-13 | Discharge: 2016-03-13 | Disposition: A | Discharge status: Home / Self Care | Payer: Medicare HMO | Attending: Emergency Medicine | Admitting: Emergency Medicine

## 2016-03-13 DIAGNOSIS — D62 Acute posthemorrhagic anemia: Secondary | ICD-10-CM

## 2016-03-13 DIAGNOSIS — Z79899 Other long term (current) drug therapy: Secondary | ICD-10-CM | POA: Insufficient documentation

## 2016-03-13 DIAGNOSIS — R69 Illness, unspecified: Secondary | ICD-10-CM | POA: Diagnosis not present

## 2016-03-13 DIAGNOSIS — F17200 Nicotine dependence, unspecified, uncomplicated: Secondary | ICD-10-CM | POA: Insufficient documentation

## 2016-03-13 DIAGNOSIS — R040 Epistaxis: Secondary | ICD-10-CM | POA: Insufficient documentation

## 2016-03-13 DIAGNOSIS — D500 Iron deficiency anemia secondary to blood loss (chronic): Secondary | ICD-10-CM | POA: Insufficient documentation

## 2016-03-13 LAB — PROTIME-INR
INR: 1
PROTHROMBIN TIME: 13.2 s (ref 11.4–15.2)

## 2016-03-13 LAB — CBC WITH DIFFERENTIAL/PLATELET
BASOS ABS: 0 10*3/uL (ref 0.0–0.1)
Basophils Relative: 0 %
EOS ABS: 0.1 10*3/uL (ref 0.0–0.7)
EOS PCT: 1 %
HCT: 30.6 % — ABNORMAL LOW (ref 39.0–52.0)
Hemoglobin: 10.4 g/dL — ABNORMAL LOW (ref 13.0–17.0)
LYMPHS PCT: 16 %
Lymphs Abs: 2.5 10*3/uL (ref 0.7–4.0)
MCH: 31.6 pg (ref 26.0–34.0)
MCHC: 34 g/dL (ref 30.0–36.0)
MCV: 93 fL (ref 78.0–100.0)
MONO ABS: 1.2 10*3/uL — AB (ref 0.1–1.0)
Monocytes Relative: 8 %
Neutro Abs: 12 10*3/uL — ABNORMAL HIGH (ref 1.7–7.7)
Neutrophils Relative %: 75 %
PLATELETS: 469 10*3/uL — AB (ref 150–400)
RBC: 3.29 MIL/uL — ABNORMAL LOW (ref 4.22–5.81)
RDW: 14.7 % (ref 11.5–15.5)
WBC: 15.9 10*3/uL — AB (ref 4.0–10.5)

## 2016-03-13 LAB — BASIC METABOLIC PANEL
Anion gap: 8 (ref 5–15)
BUN: 26 mg/dL — AB (ref 6–20)
CO2: 28 mmol/L (ref 22–32)
CREATININE: 1.03 mg/dL (ref 0.61–1.24)
Calcium: 8.9 mg/dL (ref 8.9–10.3)
Chloride: 102 mmol/L (ref 101–111)
GFR calc Af Amer: >60 mL/min (ref >60)
GFR calc non Af Amer: >60 mL/min (ref >60)
GLUCOSE: 119 mg/dL — AB (ref 65–99)
POTASSIUM: 3.6 mmol/L (ref 3.5–5.1)
SODIUM: 138 mmol/L (ref 135–145)

## 2016-03-13 LAB — APTT: APTT: 34 s (ref 24–36)

## 2016-03-13 MED ORDER — OXYCODONE-ACETAMINOPHEN 5-325 MG PO TABS
1.0000 | ORAL_TABLET | Freq: Once | ORAL | Status: AC
  Administered 2016-03-13: 1 via ORAL
  Filled 2016-03-13: qty 1

## 2016-03-13 NOTE — ED Provider Notes (Signed)
[facility] DEPT Provider Note   CSN: [id number] Arrival date & time: 03/13/16  0602     History   Chief Complaint Chief Complaint  Patient presents with  . Epistaxis    HPI Bradley [name] is a 76 y.o. male.  He comes in because of recurrent epistaxis. He has been having problems with nosebleeds over the last week, and has been to the ED for other times. He currently has a nasal pack in the right nostril. 5 AM, he noticed bleeding going down the back of his throat. He tried to treated with head positioning and has slowed it down. Also, his daughter increased the air in 2 balloons in the nasal pack. It feels like the bleeding has stopped at this point. He is scheduled to see an ear, nose, throat doctor tomorrow. He does take low-dose aspirin but is on no other anticoagulants. He is a cigarette smoker. He is requesting medication for pain.   The history is provided by the patient.    Past Medical History:  Diagnosis Date  . Coronary artery disease     There are no active problems to display for this patient.   Past Surgical History:  Procedure Laterality Date  . APPENDECTOMY    . TONSILLECTOMY    . TOOTH EXTRACTION         Home Medications    Prior to Admission medications   Medication Sig Start Date End Date Taking? Authorizing Provider  ADVAIR HFA 115-21 MCG/ACT inhaler Inhale 2 puffs into the lungs every 12 (twelve) hours. 01/29/16   Historical Provider, MD  aspirin EC 81 MG tablet Take 81 mg by mouth daily.    Historical Provider, MD  cephALEXin (KEFLEX) 500 MG capsule Take 1 capsule (500 mg total) by mouth 2 (two) times daily. 03/10/16 03/15/16  Leo Grosser, MD  diltiazem (CARDIZEM CD) 240 MG 24 hr capsule Take 1 capsule by mouth daily. 12/23/15   Historical Provider, MD  ferrous sulfate 325 (65 FE) MG tablet Take 325 mg by mouth daily. 12/10/15   Historical Provider, MD  isosorbide mononitrate (ISMO,MONOKET) 20 MG tablet Take 1 tablet by mouth 2 (two) times  daily. 12/18/15   Historical Provider, MD  metoCLOPramide (REGLAN) 5 MG tablet Take 1 tablet by mouth 4 (four) times daily -  before meals and at bedtime. 01/25/16   Historical Provider, MD  metoprolol succinate (TOPROL-XL) 25 MG 24 hr tablet Take 1 tablet by mouth daily. 02/19/16   Historical Provider, MD  nortriptyline (PAMELOR) 10 MG capsule Take 3 capsules by mouth at bedtime. 02/10/16   Historical Provider, MD  omeprazole (PRILOSEC) 20 MG capsule Take 1 capsule by mouth 2 (two) times daily. 02/08/16   Historical Provider, MD  Oxycodone HCl 10 MG TABS Take 1 tablet by mouth 3 (three) times daily. 02/28/16   Historical Provider, MD  oxymorphone (OPANA ER) 10 MG 12 hr tablet Take 10 mg by mouth every 12 (twelve) hours.    Historical Provider, MD  simvastatin (ZOCOR) 10 MG tablet Take 10 mg by mouth daily. 12/23/15   Historical Provider, MD  VENTOLIN HFA 108 (90 Base) MCG/ACT inhaler Inhale 2 puffs into the lungs every 4 (four) hours as needed. 12/11/15   Historical Provider, MD    Family History No family history on file.  Social History Social History  Substance Use Topics  . Smoking status: Current Every Day Smoker  . Smokeless tobacco: Never Used  . Alcohol use No     Allergies  Patient has no known allergies.   Review of Systems Review of Systems  All other systems reviewed and are negative.    Physical Exam Updated Vital Signs BP 141/77 (BP Location: Left Arm)   Pulse 78   Temp 98.1 F (36.7 C) (Oral)   Resp 22   Ht 6' (1.829 m)   Wt 168 lb (76.2 kg)   SpO2 99%   BMI 22.78 kg/m   Physical Exam  Nursing note and vitals reviewed.  76 year old male, resting comfortably and in no acute distress. Vital signs are significant for mild tachypnea and borderline systolic hypertension. Oxygen saturation is 99%, which is normal. Head is normocephalic and atraumatic. PERRLA, EOMI. Oropharynx is shows a single string of that present on the right side. 2 balloon Rapid Rhino is  present on the right nostril with some dried blood on it. No bleeding is seen in the left nostril. Neck is nontender and supple without adenopathy or JVD. Back is nontender and there is no CVA tenderness. Lungs are clear without rales, wheezes, or rhonchi. Chest is nontender. Heart has regular rate and rhythm without murmur. Abdomen is soft, flat, nontender without masses or hepatosplenomegaly and peristalsis is normoactive. Extremities have no cyanosis or edema, full range of motion is present. Skin is warm and dry without rash. Neurologic: Mental status is normal, cranial nerves are intact, there are no motor or sensory deficits. Voice is gravelly, but patient states that that has only been the case since the nosebleed.  ED Treatments / Results  Labs (all labs ordered are listed, but only abnormal results are displayed) Labs Reviewed  BASIC METABOLIC PANEL - Abnormal; Notable for the following:       Result Value   Glucose, Bld 119 (*)    BUN 26 (*)    All other components within normal limits  CBC WITH DIFFERENTIAL/PLATELET - Abnormal; Notable for the following:    WBC 15.9 (*)    RBC 3.29 (*)    Hemoglobin 10.4 (*)    HCT 30.6 (*)    Platelets 469 (*)    Neutro Abs 12.0 (*)    Monocytes Absolute 1.2 (*)    All other components within normal limits  PROTIME-INR  APTT    Procedures Procedures (including critical care time)  Medications Ordered in ED Medications  oxyCODONE-acetaminophen (PERCOCET/ROXICET) 5-325 MG per tablet 1 tablet (not administered)     Initial Impression / Assessment and Plan / ED Course  I have reviewed the triage vital signs and the nursing notes.  Pertinent labs & imaging results that were available during my care of the patient were reviewed by me and considered in my medical decision making (see chart for details).  Persistent and recurrent right-sided epistaxis. Old records are reviewed, and he has 4 prior ED visits. Epistaxis is been treated  with cautery, 5 cm Rapid Rhino, and current treatment of 7 cm 2 balloon Rapid Rhino. No active bleeding currently. Bleeding site is clearly not in the anterior portion of the nose, and also not in any area which is getting compression from the Rapid Rhino. He will need fiberoptic nasopharyngoscopy to identify the leading site. We'll check hemoglobin to ensure that he has not had significant blood loss. Will also check coagulation studies because of recurrent epistaxis resistant to usual treatment. I will try to get him into see his ENT physician today instead of waiting until tomorrow.  He has had no recurrence of bleeding while in the ED. Hemoglobin  has dropped to 10.4 from 13.0 4 dayys ago. Coagulation studies are normal. Case is discussed with Dr. Redmond Baseman of ENT service who agrees to see the patient in the office today. Patient is to call for a same day appointment.  Final Clinical Impressions(s) / ED Diagnoses   Final diagnoses:  Epistaxis  Anemia due to acute blood loss    New Prescriptions New Prescriptions   No medications on file     Delora Fuel, MD 74/08/14 4818

## 2016-03-13 NOTE — Discharge Instructions (Signed)
See the ENT doctor today - call the office for a same-day appointment, and tell them that we spoke with Dr. Redmond Baseman.

## 2016-03-13 NOTE — ED Triage Notes (Signed)
Pt has a rhino rocket in place since Monday, states early this am his nose started bleeding.  Pt's daughter attempted to re-inflate the balloon as she was supposedly instructed if bleeding reoccurred.  Pt states he has bled enough to soak a bath towel.  No bleeding at this time.

## 2016-03-14 DIAGNOSIS — R69 Illness, unspecified: Secondary | ICD-10-CM | POA: Diagnosis not present

## 2016-03-14 DIAGNOSIS — I252 Old myocardial infarction: Secondary | ICD-10-CM | POA: Diagnosis not present

## 2016-03-14 DIAGNOSIS — R040 Epistaxis: Secondary | ICD-10-CM | POA: Diagnosis not present

## 2016-03-14 DIAGNOSIS — J342 Deviated nasal septum: Secondary | ICD-10-CM | POA: Diagnosis not present

## 2016-03-14 DIAGNOSIS — Z7982 Long term (current) use of aspirin: Secondary | ICD-10-CM | POA: Diagnosis not present

## 2016-03-15 ENCOUNTER — Emergency Department (HOSPITAL_COMMUNITY): Payer: Medicare HMO | Admitting: Certified Registered Nurse Anesthetist

## 2016-03-15 ENCOUNTER — Encounter (HOSPITAL_COMMUNITY): Payer: Self-pay | Admitting: Emergency Medicine

## 2016-03-15 ENCOUNTER — Emergency Department (HOSPITAL_COMMUNITY): Payer: Medicare HMO

## 2016-03-15 ENCOUNTER — Observation Stay (HOSPITAL_COMMUNITY)
Admission: EM | Admit: 2016-03-15 | Discharge: 2016-03-16 | Disposition: A | Discharge status: Home / Self Care | Payer: Medicare HMO | Attending: Otolaryngology | Admitting: Otolaryngology

## 2016-03-15 ENCOUNTER — Encounter (HOSPITAL_COMMUNITY)
Admission: EM | Disposition: A | Discharge status: Home / Self Care | Payer: Self-pay | Source: Home / Self Care | Attending: Emergency Medicine

## 2016-03-15 DIAGNOSIS — F17210 Nicotine dependence, cigarettes, uncomplicated: Secondary | ICD-10-CM | POA: Insufficient documentation

## 2016-03-15 DIAGNOSIS — J342 Deviated nasal septum: Secondary | ICD-10-CM | POA: Diagnosis not present

## 2016-03-15 DIAGNOSIS — I2510 Atherosclerotic heart disease of native coronary artery without angina pectoris: Secondary | ICD-10-CM | POA: Insufficient documentation

## 2016-03-15 DIAGNOSIS — R040 Epistaxis: Secondary | ICD-10-CM | POA: Diagnosis not present

## 2016-03-15 DIAGNOSIS — Z7982 Long term (current) use of aspirin: Secondary | ICD-10-CM | POA: Diagnosis not present

## 2016-03-15 DIAGNOSIS — I959 Hypotension, unspecified: Secondary | ICD-10-CM | POA: Diagnosis not present

## 2016-03-15 DIAGNOSIS — R69 Illness, unspecified: Secondary | ICD-10-CM | POA: Diagnosis not present

## 2016-03-15 HISTORY — PX: SINUS EXPLORATION: SHX5214

## 2016-03-15 HISTORY — PX: NASAL ENDOSCOPY WITH EPISTAXIS CONTROL: SHX5664

## 2016-03-15 HISTORY — PX: SEPTOPLASTY: SHX2393

## 2016-03-15 HISTORY — DX: Acute myocardial infarction, unspecified: I21.9

## 2016-03-15 LAB — I-STAT CHEM 8, ED
BUN: 22 mg/dL — ABNORMAL HIGH (ref 6–20)
CHLORIDE: 106 mmol/L (ref 101–111)
Calcium, Ion: 1.08 mmol/L — ABNORMAL LOW (ref 1.15–1.40)
Creatinine, Ser: 1 mg/dL (ref 0.61–1.24)
GLUCOSE: 116 mg/dL — AB (ref 65–99)
HCT: 29 % — ABNORMAL LOW (ref 39.0–52.0)
Hemoglobin: 9.9 g/dL — ABNORMAL LOW (ref 13.0–17.0)
POTASSIUM: 3.6 mmol/L (ref 3.5–5.1)
Sodium: 142 mmol/L (ref 135–145)
TCO2: 25 mmol/L (ref 0–100)

## 2016-03-15 SURGERY — CONTROL OF EPISTAXIS, ENDOSCOPIC
Anesthesia: General | Site: Nose | Laterality: Right

## 2016-03-15 MED ORDER — FENTANYL CITRATE (PF) 100 MCG/2ML IJ SOLN
INTRAMUSCULAR | Status: DC | PRN
  Administered 2016-03-15: 25 ug via INTRAVENOUS
  Administered 2016-03-15: 50 ug via INTRAVENOUS
  Administered 2016-03-15: 100 ug via INTRAVENOUS
  Administered 2016-03-15: 25 ug via INTRAVENOUS

## 2016-03-15 MED ORDER — HYDROCODONE-ACETAMINOPHEN 5-325 MG PO TABS
1.0000 | ORAL_TABLET | ORAL | Status: DC | PRN
  Administered 2016-03-15 – 2016-03-16 (×2): 2 via ORAL
  Filled 2016-03-15 (×2): qty 2

## 2016-03-15 MED ORDER — ONDANSETRON HCL 4 MG/2ML IJ SOLN: 4.0000 mg | INTRAMUSCULAR | Status: DC | PRN

## 2016-03-15 MED ORDER — PANTOPRAZOLE SODIUM 40 MG PO TBEC
40.0000 mg | DELAYED_RELEASE_TABLET | Freq: Every day | ORAL | Status: DC
  Administered 2016-03-15 – 2016-03-16 (×2): 40 mg via ORAL
  Filled 2016-03-15 (×2): qty 1

## 2016-03-15 MED ORDER — PROPOFOL 10 MG/ML IV BOLUS
INTRAVENOUS | Status: DC | PRN
  Administered 2016-03-15: 130 mg via INTRAVENOUS

## 2016-03-15 MED ORDER — LACTATED RINGERS IV SOLN
INTRAVENOUS | Status: DC | PRN
  Administered 2016-03-15 (×2): via INTRAVENOUS

## 2016-03-15 MED ORDER — NICOTINE 14 MG/24HR TD PT24
14.0000 mg | MEDICATED_PATCH | TRANSDERMAL | Status: DC
  Administered 2016-03-15: 14 mg via TRANSDERMAL
  Filled 2016-03-15: qty 1

## 2016-03-15 MED ORDER — MOMETASONE FURO-FORMOTEROL FUM 200-5 MCG/ACT IN AERO
2.0000 | INHALATION_SPRAY | Freq: Two times a day (BID) | RESPIRATORY_TRACT | Status: DC
  Administered 2016-03-15 – 2016-03-16 (×2): 2 via RESPIRATORY_TRACT
  Filled 2016-03-15 (×2): qty 8.8

## 2016-03-15 MED ORDER — IBUPROFEN 100 MG/5ML PO SUSP
400.0000 mg | Freq: Four times a day (QID) | ORAL | Status: DC | PRN
  Filled 2016-03-15: qty 20

## 2016-03-15 MED ORDER — DEXTROSE-NACL 5-0.45 % IV SOLN
INTRAVENOUS | Status: DC
  Administered 2016-03-15: 10:00:00 via INTRAVENOUS

## 2016-03-15 MED ORDER — STERILE WATER FOR IRRIGATION IR SOLN
Status: DC | PRN
  Administered 2016-03-15: 1000 mL

## 2016-03-15 MED ORDER — DEXTROSE-NACL 5-0.45 % IV SOLN
INTRAVENOUS | Status: DC
  Administered 2016-03-15 – 2016-03-16 (×2): via INTRAVENOUS

## 2016-03-15 MED ORDER — ISOSORBIDE MONONITRATE 20 MG PO TABS
20.0000 mg | ORAL_TABLET | Freq: Two times a day (BID) | ORAL | Status: DC
  Administered 2016-03-15 – 2016-03-16 (×2): 20 mg via ORAL
  Filled 2016-03-15 (×2): qty 1

## 2016-03-15 MED ORDER — OXYMETAZOLINE HCL 0.05 % NA SOLN
NASAL | Status: DC | PRN
  Administered 2016-03-15 (×2): 1

## 2016-03-15 MED ORDER — METOPROLOL SUCCINATE ER 25 MG PO TB24
25.0000 mg | ORAL_TABLET | Freq: Every day | ORAL | Status: DC
  Administered 2016-03-15 – 2016-03-16 (×2): 25 mg via ORAL
  Filled 2016-03-15 (×2): qty 1

## 2016-03-15 MED ORDER — FENTANYL CITRATE (PF) 100 MCG/2ML IJ SOLN
25.0000 ug | INTRAMUSCULAR | Status: DC | PRN
  Administered 2016-03-15: 25 ug via INTRAVENOUS
  Administered 2016-03-15: 50 ug via INTRAVENOUS
  Administered 2016-03-15: 25 ug via INTRAVENOUS

## 2016-03-15 MED ORDER — NORTRIPTYLINE HCL 10 MG PO CAPS
30.0000 mg | ORAL_CAPSULE | Freq: Every day | ORAL | Status: DC
  Administered 2016-03-15: 30 mg via ORAL
  Filled 2016-03-15: qty 3

## 2016-03-15 MED ORDER — EPHEDRINE SULFATE 50 MG/ML IJ SOLN
INTRAMUSCULAR | Status: DC | PRN
  Administered 2016-03-15 (×3): 5 mg via INTRAVENOUS

## 2016-03-15 MED ORDER — BACITRACIN ZINC 500 UNIT/GM EX OINT
TOPICAL_OINTMENT | CUTANEOUS | Status: DC | PRN
  Administered 2016-03-15: 1 via TOPICAL

## 2016-03-15 MED ORDER — ALBUTEROL SULFATE (2.5 MG/3ML) 0.083% IN NEBU: 3.0000 mL | INHALATION_SOLUTION | RESPIRATORY_TRACT | Status: DC | PRN

## 2016-03-15 MED ORDER — PHENYLEPHRINE HCL 10 MG/ML IJ SOLN
INTRAMUSCULAR | Status: DC | PRN
  Administered 2016-03-15 (×2): 80 ug via INTRAVENOUS
  Administered 2016-03-15 (×6): 40 ug via INTRAVENOUS

## 2016-03-15 MED ORDER — BACITRACIN ZINC 500 UNIT/GM EX OINT
TOPICAL_OINTMENT | CUTANEOUS | Status: AC
  Filled 2016-03-15: qty 28.35

## 2016-03-15 MED ORDER — OXYMETAZOLINE HCL 0.05 % NA SOLN
2.0000 | NASAL | Status: DC | PRN
  Filled 2016-03-15: qty 15

## 2016-03-15 MED ORDER — SUGAMMADEX SODIUM 200 MG/2ML IV SOLN
INTRAVENOUS | Status: DC | PRN
  Administered 2016-03-15: 150 mg via INTRAVENOUS

## 2016-03-15 MED ORDER — METOCLOPRAMIDE HCL 5 MG PO TABS
5.0000 mg | ORAL_TABLET | Freq: Three times a day (TID) | ORAL | Status: DC
  Administered 2016-03-15 – 2016-03-16 (×4): 5 mg via ORAL
  Filled 2016-03-15 (×4): qty 1

## 2016-03-15 MED ORDER — OXYCODONE HCL 5 MG PO TABS
10.0000 mg | ORAL_TABLET | Freq: Three times a day (TID) | ORAL | Status: DC
  Administered 2016-03-15 – 2016-03-16 (×2): 10 mg via ORAL
  Filled 2016-03-15 (×2): qty 2

## 2016-03-15 MED ORDER — CEPHALEXIN 500 MG PO CAPS: 500.0000 mg | ORAL_CAPSULE | Freq: Two times a day (BID) | ORAL | Status: DC

## 2016-03-15 MED ORDER — LIDOCAINE-EPINEPHRINE (PF) 1 %-1:200000 IJ SOLN
INTRAMUSCULAR | Status: AC
  Filled 2016-03-15: qty 30

## 2016-03-15 MED ORDER — CEFAZOLIN SODIUM-DEXTROSE 2-4 GM/100ML-% IV SOLN
2.0000 g | INTRAVENOUS | Status: AC
  Administered 2016-03-15: 2 g via INTRAVENOUS
  Filled 2016-03-15: qty 100

## 2016-03-15 MED ORDER — LIDOCAINE HCL (CARDIAC) 20 MG/ML IV SOLN
INTRAVENOUS | Status: DC | PRN
  Administered 2016-03-15: 60 mg via INTRAVENOUS

## 2016-03-15 MED ORDER — ONDANSETRON HCL 4 MG PO TABS
4.0000 mg | ORAL_TABLET | ORAL | Status: DC | PRN
  Administered 2016-03-16: 4 mg via ORAL
  Filled 2016-03-15: qty 1

## 2016-03-15 MED ORDER — CEPHALEXIN 500 MG PO CAPS
500.0000 mg | ORAL_CAPSULE | Freq: Four times a day (QID) | ORAL | Status: DC
  Administered 2016-03-15 – 2016-03-16 (×4): 500 mg via ORAL
  Filled 2016-03-15 (×5): qty 1

## 2016-03-15 MED ORDER — ROCURONIUM BROMIDE 100 MG/10ML IV SOLN
INTRAVENOUS | Status: DC | PRN
  Administered 2016-03-15: 30 mg via INTRAVENOUS

## 2016-03-15 MED ORDER — FENTANYL CITRATE (PF) 100 MCG/2ML IJ SOLN
INTRAMUSCULAR | Status: AC
  Filled 2016-03-15: qty 2

## 2016-03-15 MED ORDER — PROPOFOL 10 MG/ML IV BOLUS
INTRAVENOUS | Status: AC
  Filled 2016-03-15: qty 20

## 2016-03-15 MED ORDER — MORPHINE SULFATE ER 15 MG PO TBCR
30.0000 mg | EXTENDED_RELEASE_TABLET | Freq: Two times a day (BID) | ORAL | Status: DC
  Administered 2016-03-15 – 2016-03-16 (×2): 30 mg via ORAL
  Filled 2016-03-15 (×2): qty 2

## 2016-03-15 MED ORDER — DILTIAZEM HCL ER COATED BEADS 240 MG PO CP24
240.0000 mg | ORAL_CAPSULE | Freq: Every day | ORAL | Status: DC
  Administered 2016-03-15 – 2016-03-16 (×2): 240 mg via ORAL
  Filled 2016-03-15 (×2): qty 1

## 2016-03-15 MED ORDER — FENTANYL CITRATE (PF) 100 MCG/2ML IJ SOLN
INTRAMUSCULAR | Status: AC
  Administered 2016-03-15: 25 ug via INTRAVENOUS
  Filled 2016-03-15: qty 2

## 2016-03-15 MED ORDER — OXYMETAZOLINE HCL 0.05 % NA SOLN
NASAL | Status: AC
  Filled 2016-03-15: qty 15

## 2016-03-15 MED ORDER — PROMETHAZINE HCL 25 MG/ML IJ SOLN: 6.2500 mg | INTRAMUSCULAR | Status: DC | PRN

## 2016-03-15 MED ORDER — ONDANSETRON HCL 4 MG/2ML IJ SOLN
INTRAMUSCULAR | Status: DC | PRN
  Administered 2016-03-15: 4 mg via INTRAVENOUS

## 2016-03-15 MED ORDER — 0.9 % SODIUM CHLORIDE (POUR BTL) OPTIME
TOPICAL | Status: DC | PRN
  Administered 2016-03-15: 1000 mL

## 2016-03-15 MED ORDER — SIMVASTATIN 10 MG PO TABS
10.0000 mg | ORAL_TABLET | Freq: Every day | ORAL | Status: DC
  Administered 2016-03-15: 10 mg via ORAL
  Filled 2016-03-15: qty 1

## 2016-03-15 MED ORDER — SUCCINYLCHOLINE CHLORIDE 20 MG/ML IJ SOLN
INTRAMUSCULAR | Status: DC | PRN
  Administered 2016-03-15: 130 mg via INTRAVENOUS

## 2016-03-15 SURGICAL SUPPLY — 37 items
BLADE SURG 15 STRL LF DISP TIS (BLADE) ×1 IMPLANT
BLADE SURG 15 STRL SS (BLADE) ×1
CANISTER SUCTION 2500CC (MISCELLANEOUS) ×2 IMPLANT
COAGULATOR SUCT SWTCH 10FR 6 (ELECTROSURGICAL) ×2 IMPLANT
COVER MAYO STAND STRL (DRAPES) ×2 IMPLANT
COVER SURGICAL LIGHT HANDLE (MISCELLANEOUS) IMPLANT
COVER TABLE BACK 60X90 (DRAPES) ×2 IMPLANT
CRADLE DONUT ADULT HEAD (MISCELLANEOUS) ×2 IMPLANT
DECANTER SPIKE VIAL GLASS SM (MISCELLANEOUS) IMPLANT
DRAPE PROXIMA HALF (DRAPES) ×2 IMPLANT
DRSG NASOPORE 8CM (GAUZE/BANDAGES/DRESSINGS) ×2 IMPLANT
DRSG TELFA 3X8 NADH (GAUZE/BANDAGES/DRESSINGS) ×2 IMPLANT
ELECT REM PT RETURN 9FT ADLT (ELECTROSURGICAL) ×2
ELECTRODE REM PT RTRN 9FT ADLT (ELECTROSURGICAL) ×1 IMPLANT
GAUZE PACKING FOLDED 2  STR (GAUZE/BANDAGES/DRESSINGS) ×2
GAUZE PACKING FOLDED 2 STR (GAUZE/BANDAGES/DRESSINGS) ×2 IMPLANT
GAUZE SPONGE 2X2 8PLY STRL LF (GAUZE/BANDAGES/DRESSINGS) ×1 IMPLANT
GAUZE SPONGE 4X4 16PLY XRAY LF (GAUZE/BANDAGES/DRESSINGS) ×2 IMPLANT
GAUZE VASELINE FOILPK 1/2 X 72 (GAUZE/BANDAGES/DRESSINGS) IMPLANT
GLOVE SS BIOGEL STRL SZ 7.5 (GLOVE) ×1 IMPLANT
GLOVE SUPERSENSE BIOGEL SZ 7.5 (GLOVE) ×1
GOWN STRL REUS W/ TWL LRG LVL3 (GOWN DISPOSABLE) ×2 IMPLANT
GOWN STRL REUS W/TWL LRG LVL3 (GOWN DISPOSABLE) ×2
KIT BASIN OR (CUSTOM PROCEDURE TRAY) ×2 IMPLANT
KIT ROOM TURNOVER OR (KITS) ×2 IMPLANT
NEEDLE HYPO 25GX1X1/2 BEV (NEEDLE) IMPLANT
NS IRRIG 1000ML POUR BTL (IV SOLUTION) ×2 IMPLANT
PAD ARMBOARD 7.5X6 YLW CONV (MISCELLANEOUS) ×4 IMPLANT
PATTIES SURGICAL .5 X3 (DISPOSABLE) ×2 IMPLANT
SHEET SIL 040 (INSTRUMENTS) ×2 IMPLANT
SPONGE GAUZE 2X2 STER 10/PKG (GAUZE/BANDAGES/DRESSINGS) ×1
SUT ETHILON 3 0 PS 1 (SUTURE) ×2 IMPLANT
SYR CONTROL 10ML LL (SYRINGE) IMPLANT
TAPE PAPER 2X10 WHT MICROPORE (GAUZE/BANDAGES/DRESSINGS) ×2 IMPLANT
TOWEL OR 17X24 6PK STRL BLUE (TOWEL DISPOSABLE) ×4 IMPLANT
TUBE CONNECTING 12X1/4 (SUCTIONS) ×2 IMPLANT
WATER STERILE IRR 1000ML POUR (IV SOLUTION) ×2 IMPLANT

## 2016-03-15 NOTE — ED Notes (Signed)
Pt. Wallet, pocket watch, and shirt taken home by daughter. Pants, underwear, belt, and belt buckle kept with patient in patient belongings bag.

## 2016-03-15 NOTE — Anesthesia Preprocedure Evaluation (Addendum)
Anesthesia Evaluation  Patient identified by MRN, date of birth, ID band Patient awake    Reviewed: Allergy & Precautions, NPO status , Patient's Chart, lab work & pertinent test results  Airway Mallampati: II  TM Distance: >3 FB Neck ROM: Full    Dental  (+) Edentulous Upper, Edentulous Lower   Pulmonary Current Smoker,    breath sounds clear to auscultation       Cardiovascular + CAD   Rhythm:Regular Rate:Normal     Neuro/Psych negative neurological ROS     GI/Hepatic negative GI ROS, Neg liver ROS,   Endo/Other  negative endocrine ROS  Renal/GU negative Renal ROS     Musculoskeletal   Abdominal   Peds  Hematology  (+) anemia ,   Anesthesia Other Findings   Reproductive/Obstetrics                            Anesthesia Physical Anesthesia Plan  ASA: III and emergent  Anesthesia Plan: General   Post-op Pain Management:    Induction: Intravenous and Rapid sequence  Airway Management Planned: Oral ETT  Additional Equipment:   Intra-op Plan:   Post-operative Plan: Extubation in OR  Informed Consent: I have reviewed the patients History and Physical, chart, labs and discussed the procedure including the risks, benefits and alternatives for the proposed anesthesia with the patient or authorized representative who has indicated his/her understanding and acceptance.   Dental advisory given  Plan Discussed with: CRNA  Anesthesia Plan Comments:         Anesthesia Quick Evaluation

## 2016-03-15 NOTE — ED Provider Notes (Signed)
[place] DEPT Provider Note   CSN: [id number] Arrival date & time: 03/15/16  7169     History   Chief Complaint Chief Complaint  Patient presents with  . Epistaxis    HPI Bradley [name] is a 76 y.o. male presenting w/ epistaxis.  Patient states bleeding began around 02:15 this morning.  He had his nose cauterized and packe at the ENT office yesterday (2/16).  He states that before 2 wks ago he "hadn't had a nosebleed since [he] was 9".  He says his nosebleeds will last "a few hours and then go away for about 22-23 hours and then comeback".  Bleeding currently ceased.  He reports right-sided nasal and maxillary sinus pain, right-sided frontotemporal headache, dizziness, and lightheadedness. Patient currently does not have nasal packing and states that he "does not know what happened to it."  Patient takes 81 mg ASA and  was instructed by "the nose doc" to d/c ASA medication today.  Last dose of ASA was yesterday AM.  Denies being on any blood thinners.  Denies fever, recent illness, referred pain, visual changes, dysphagia, N/V, change in bowel or urine.  Currently smokes  pack per day.    HPI  Past Medical History:  Diagnosis Date  . Coronary artery disease     There are no active problems to display for this patient.   Past Surgical History:  Procedure Laterality Date  . APPENDECTOMY    . TONSILLECTOMY    . TOOTH EXTRACTION         Home Medications    Prior to Admission medications   Medication Sig Start Date End Date Taking? Authorizing Provider  ADVAIR HFA 115-21 MCG/ACT inhaler Inhale 2 puffs into the lungs every 12 (twelve) hours. 01/29/16  Yes Historical Provider, MD  aspirin EC 81 MG tablet Take 81 mg by mouth daily.   Yes Historical Provider, MD  cephALEXin (KEFLEX) 500 MG capsule Take 1 capsule (500 mg total) by mouth 2 (two) times daily. 03/10/16 03/15/16 Yes Leo Grosser, MD  diltiazem (CARDIZEM CD) 240 MG 24 hr capsule Take 1 capsule by mouth daily.  12/23/15  Yes Historical Provider, MD  ferrous sulfate 325 (65 FE) MG tablet Take 325 mg by mouth daily. 12/10/15  Yes Historical Provider, MD  isosorbide mononitrate (ISMO,MONOKET) 20 MG tablet Take 1 tablet by mouth 2 (two) times daily. 12/18/15  Yes Historical Provider, MD  metoCLOPramide (REGLAN) 5 MG tablet Take 1 tablet by mouth 4 (four) times daily -  before meals and at bedtime. 01/25/16  Yes Historical Provider, MD  metoprolol succinate (TOPROL-XL) 25 MG 24 hr tablet Take 1 tablet by mouth daily. 02/19/16  Yes Historical Provider, MD  nortriptyline (PAMELOR) 10 MG capsule Take 3 capsules by mouth at bedtime. 02/10/16  Yes Historical Provider, MD  omeprazole (PRILOSEC) 20 MG capsule Take 1 capsule by mouth 2 (two) times daily. 02/08/16  Yes Historical Provider, MD  Oxycodone HCl 10 MG TABS Take 1 tablet by mouth 3 (three) times daily. 02/28/16  Yes Historical Provider, MD  oxymorphone (OPANA ER) 10 MG 12 hr tablet Take 10 mg by mouth every 12 (twelve) hours.   Yes Historical Provider, MD  simvastatin (ZOCOR) 10 MG tablet Take 10 mg by mouth daily. 12/23/15  Yes Historical Provider, MD  VENTOLIN HFA 108 (90 Base) MCG/ACT inhaler Inhale 2 puffs into the lungs every 4 (four) hours as needed for wheezing or shortness of breath.  12/11/15  Yes Historical Provider, MD    Family  History No family history on file.  Social History Social History  Substance Use Topics  . Smoking status: Current Every Day Smoker  . Smokeless tobacco: Never Used  . Alcohol use No     Allergies   Patient has no known allergies.   Review of Systems Review of Systems Ten systems reviewed and are negative for acute change, except as noted in the HPI.    Physical Exam Updated Vital Signs BP 124/76   Pulse 72   Temp 97.6 F (36.4 C) (Axillary)   SpO2 98%   Physical Exam  Constitutional: He is oriented to person, place, and time. He appears well-developed and well-nourished. No distress.  Smells of  cigarettes   HENT:  Head: Normocephalic and atraumatic.  Nose: Epistaxis is observed.    Eyes: Conjunctivae and EOM are normal. Pupils are equal, round, and reactive to light. No scleral icterus.  Neck: Normal range of motion. Neck supple.  Cardiovascular: Normal rate, regular rhythm and normal heart sounds.   Pulmonary/Chest: Effort normal and breath sounds normal. No respiratory distress.  Abdominal: Soft. There is no tenderness.  Musculoskeletal: He exhibits no edema.  Neurological: He is alert and oriented to person, place, and time.  Skin: Skin is warm and dry. He is not diaphoretic.  Psychiatric: His behavior is normal.  Nursing note and vitals reviewed.    ED Treatments / Results  Labs (all labs ordered are listed, but only abnormal results are displayed) Labs Reviewed  I-STAT CHEM 8, ED - Abnormal; Notable for the following:       Result Value   BUN 22 (*)    Glucose, Bld 116 (*)    Calcium, Ion 1.08 (*)    Hemoglobin 9.9 (*)    HCT 29.0 (*)    All other components within normal limits    EKG  EKG Interpretation None       Radiology No results found.  Procedures Procedures (including critical care time)  Medications Ordered in ED Medications - No data to display   Initial Impression / Assessment and Plan / ED Course  I have reviewed the triage vital signs and the nursing notes.  Pertinent labs & imaging results that were available during my care of the patient were reviewed by me and considered in my medical decision making (see chart for details).      Patient Taken to the OR For definitive management of his epistaxis By Dr. Warren Danes  Final Clinical Impressions(s) / ED Diagnoses   Final diagnoses:  Epistaxis    New Prescriptions New Prescriptions   No medications on file     Margarita Mail, PA-C 03/15/16 [place], DO 03/16/16 1443

## 2016-03-15 NOTE — ED Notes (Signed)
ENT at bedside

## 2016-03-15 NOTE — Anesthesia Procedure Notes (Signed)
Procedure Name: Intubation Date/Time: 03/15/2016 12:49 PM Performed by: Shirlyn Goltz Pre-anesthesia Checklist: Patient identified, Suction available, Emergency Drugs available and Patient being monitored Patient Re-evaluated:Patient Re-evaluated prior to inductionOxygen Delivery Method: Circle system utilized Preoxygenation: Pre-oxygenation with 100% oxygen Intubation Type: IV induction, Rapid sequence and Cricoid Pressure applied Laryngoscope Size: Mac and 3 Grade View: Grade I Tube type: Oral Tube size: 7.0 mm Number of attempts: 1 Airway Equipment and Method: Stylet Placement Confirmation: ETT inserted through vocal cords under direct vision,  positive ETCO2 and breath sounds checked- equal and bilateral Secured at: 21 cm Tube secured with: Tape Dental Injury: Teeth and Oropharynx as per pre-operative assessment

## 2016-03-15 NOTE — ED Notes (Signed)
Patient transported to CT 

## 2016-03-15 NOTE — Consult Note (Signed)
[place], [place] 76 y.o., male [id number]     Chief Complaint: RIGHT epistaxis  HPI: 76 yo wm, RIGHT profuse episodic epistaxis over the last week.  Packed several times in various ED's.  Seen by Dr. Redmond Baseman 15 FEB, then by me 41 FEB.  Polypoid changes in RIGHT middle meatus.  Active site not identified.  Fibrillar surgicel packing placed.  Onset bleeding again 0200 this AM, stopped spont after ~4 hrs.  On baby ASA daily.   No other known bleeding issues.    PMH: Past Medical History:  Diagnosis Date  . Coronary artery disease     Surg Hx: Past Surgical History:  Procedure Laterality Date  . APPENDECTOMY    . TONSILLECTOMY    . TOOTH EXTRACTION      FHx:  No family history on file. SocHx:  reports that he has been smoking.  He has never used smokeless tobacco. He reports that he does not drink alcohol or use drugs.  ALLERGIES: No Known Allergies   (Not in a hospital admission)  Results for orders placed or performed during the hospital encounter of 03/15/16 (from the past 48 hour(s))  I-stat chem 8, ed     Status: Abnormal   Collection Time: 03/15/16  7:47 AM  Result Value Ref Range   Sodium 142 135 - 145 mmol/L   Potassium 3.6 3.5 - 5.1 mmol/L   Chloride 106 101 - 111 mmol/L   BUN 22 (H) 6 - 20 mg/dL   Creatinine, Ser 1.00 0.61 - 1.24 mg/dL   Glucose, Bld 116 (H) 65 - 99 mg/dL   Calcium, Ion 1.08 (L) 1.15 - 1.40 mmol/L   TCO2 25 0 - 100 mmol/L   Hemoglobin 9.9 (L) 13.0 - 17.0 g/dL   HCT 29.0 (L) 39.0 - 52.0 %   No results found.  ROS:  No chest pain or SOB.     Blood pressure 117/78, pulse 66, temperature 97.6 F (36.4 C), temperature source Axillary, resp. rate 16, SpO2 99 %.  PHYSICAL EXAM: Overall appearance:  pale, weak, smells of tobacco smoke.  No active bleeding Head:  NCAT Ears:  clear Nose: old blood RIGHT nose. Oral Cavity:  Old blood in pharynx Oral Pharynx/Hypopharynx/Larynx: not examined Neuro:  Grossly intact Neck: prominent carotid  bulbs  Studies Reviewed:  CT sinus pending    Assessment/Plan RIGHT epistaxis.  ASA.    Plan:  To OR for endoscopic eval.  and control.   Discussed with family.  Informed consent obtained.   Will begin IV fluids.    Jodi Marble 4/62/7035, 9:47 AM

## 2016-03-15 NOTE — ED Notes (Signed)
RN contacted CT about when pt. Would be taken. CT notified they would get him as soon as they could.

## 2016-03-15 NOTE — ED Triage Notes (Addendum)
Pt from home by EMS. Pt was seen by ENT and had surgery on nose yesterday for recurrent nosebleeds. Pt reports the bleeding woke him up this morning at 0215 and was bleeding for 4 hours. Pt's VS WNL. Pt reports slight lightheadedness.

## 2016-03-15 NOTE — Anesthesia Postprocedure Evaluation (Signed)
Anesthesia Post Note  Patient: Auryn Paige  Procedure(s) Performed: Procedure(s) (LRB): NASAL ENDOSCOPY WITH EPISTAXIS CONTROL (Right) SINUS EXPLORATION (Right) SEPTOPLASTY (Right)  Patient location during evaluation: PACU Anesthesia Type: General Level of consciousness: awake and alert Pain management: pain level controlled Vital Signs Assessment: post-procedure vital signs reviewed and stable Respiratory status: spontaneous breathing, nonlabored ventilation, respiratory function stable and patient connected to nasal cannula oxygen Cardiovascular status: blood pressure returned to baseline and stable Postop Assessment: no signs of nausea or vomiting Anesthetic complications: no       Last Vitals:  Vitals:   03/15/16 1529 03/15/16 1545  BP: (!) 149/71 (!) 152/84  Pulse: 66 68  Resp: (!) 26 (!) 22  Temp:      Last Pain:  Vitals:   03/15/16 1515  TempSrc:   PainSc: 4                  Tiajuana Amass

## 2016-03-15 NOTE — ED Notes (Signed)
Contacted Dr. Erik Obey that patient back from CT scan.

## 2016-03-15 NOTE — ED Notes (Signed)
Pt returns from ct scan. 

## 2016-03-15 NOTE — ED Notes (Signed)
Pt states he is having chest pain. EKG preformed and given to Dr.Floyd.

## 2016-03-15 NOTE — ED Notes (Signed)
Pt transported to OR holding with no issues.

## 2016-03-15 NOTE — Op Note (Signed)
03/15/2016  3:06 PM    Bradley [name]  [id number]   Pre-Op Dx:  Deviated Nasal Septum, RIGHT epistaxis  Post-op Dx: Same  Proc: Nasal Septoplasty, RIGHT endoscopic cautery epistaxis  Surg:  Jodi Marble T MD  Anes:  GOT  EBL:  50  Comp:  none  Findings:  RIGHTward septal deviation with RIGHT chondroethmoid spur.  Oozing from multiple sites but no major site identified.  LEFT nose clear. Mild bleeding from vault of LEFT of nose.  Mild bleeding in RIGHT middle meatus.  No bleeding in RIGHT inferior meatus.    Procedure:     With the patient in a comfortable supine position,  general orotracheal anesthesia was induced without difficulty.     The patient received preoperative Afrin spray for topical decongestion and vasoconstriction.  Intravenous prophylactic antibiotics were administered.  A routine surgical time out was obtained.  At an appropriate level, the patient was placed in a semi-sitting position.  A saline moistened throat pack was placed.  Nasal vibrissae were trimmed.   Afrin  solution was applied on 0.5" x 3" cottonoids to both sides of the septal mucosa.   1% Xylocaine with 1:100,000 epinephrine, 8 cc's, was infiltrated into the anterior floor of the nose, into the nasal spine region, into the membranous columella, and finally into the submucoperichondrial plane of the septum on both sides.  Several minutes were allowed for this to take effect.  A sterile preparation and draping of the midface was accomplished in the standard fashion.  The nose an nasopharynx were fully inspected on both sides using the  0 degree and 30 degree nasal endoscopes.  Old cautery sites were identified.  The pharynx was suctioned clear.    Election was made for septoplasty to allow visualization of the high posterior nose.  A LEFT hemitransfixion incision was sharply executed and carried down to the caudal edge of the quadrangular cartilage and continued to a floor incision.  An  opposite small floor incision was sharply executed as well.   Floor tunnels were elevated on both sides, carried posteriorly, then medially, then brought forward along the vomer and maxillary crest.  The submucoperichondrial plane of the  LEFT septum was dissected up to the dorsum of the nose, back onto the perpendicular plate, and brought down and communicated with a floor tunnel and then forward along the maxillary crest.  The flap was generated intact.  The chondroethmoid junction was identified and opened with a Psychologist, educational.  The opposite submucoperiosteal plane of the perpendicular plate of the ethmoid  was elevated and carried down to the floor tunnel posteriorly.  The superior perpendicular plate was lysed with an open Jansen-Middleton forceps.  The inferior portion was dissected from the maxillary crest and vomer with a Cottle elevator.  The midportion was rocked free with a closed KeySpan forceps and then delivered.    The posterior inferior corner of the quadrangular cartilage was submucosally resected, including a cartilaginous tail up along the vomer.   The RIGHT septal flap was generated intact.  The nasal endscopes were used once again to examine the RIGHT nose.  Several oozing sites were electrocauterized.    After mobilizing the septum adequately, and straightening it in the standard fashion,   A good straight midline configuration of the septum with good dorsal support was generated.  The septal tunnel was suctioned clear.  Hemostasis was observed.  The flaps were laid back down.  The incisions were closed with interrupted 4-0 chromic suture.  0.040" reinforced Silastic splints were fashioned, placed against the nasal septum for support, and secured thereto with a 3-0 Ethilon stitch.  Nasapore packing was placed into the RIGHT middle meatus, between the middle turbinate and the septums, and into the high anterior vault of the nose.    Telfa packs impregnated with  bacitracin ointment were placed between the septum and the inferior turbinates, one on each side, for hemostasis and support.     The patient was observed about 15 minutes with no further active bleeding noted.     At this point the procedure was completed.  The pharynx was suctioned free and the throat pack was removed.   The patient was returned to anesthesia, awakened, extubated, and transferred to recovery in stable condition.  Dispo:   PACU to 23 hr observation  Plan: Ice, elevation, narcotic analgesia, prophylactic antibiotics for the duration of indwelling nasal foreign bodies.  We will remove the nasal packing In one day, the septal splints in 10 days.  Return to work or school in 10 days, strenuous activities in two weeks.  Tyson Alias MD

## 2016-03-15 NOTE — Discharge Instructions (Signed)
Keep head elevated x 3-4 nights No nose blowing Change drip pad as needed Rinse throat with cool dilute salt water to clear old blood and thick phlegm Hold on baby aspirin for one week Recheck my office 9 days for internal splint removal.  [phone] for an appointment.

## 2016-03-15 NOTE — Transfer of Care (Signed)
Immediate Anesthesia Transfer of Care Note  Patient: Bradley [name]  Procedure(s) Performed: Procedure(s): NASAL ENDOSCOPY WITH EPISTAXIS CONTROL (Right) SINUS EXPLORATION (Right) SEPTOPLASTY (Right)  Patient Location: PACU  Anesthesia Type:General  Level of Consciousness: awake and alert   Airway & Oxygen Therapy: Patient Spontanous Breathing  Post-op Assessment: Report given to RN and Post -op Vital signs reviewed and stable  Post vital signs: Reviewed and stable  Last Vitals:  Vitals:   03/15/16 1130 03/15/16 1145  BP: 132/69 135/72  Pulse: 65 63  Resp: 25 18  Temp:      Last Pain:  Vitals:   03/15/16 0915  TempSrc:   PainSc: 8          Complications: No apparent anesthesia complications

## 2016-03-16 ENCOUNTER — Encounter (HOSPITAL_COMMUNITY): Payer: Self-pay | Admitting: Otolaryngology

## 2016-03-16 NOTE — Progress Notes (Signed)
Discharge instructions gone over with patient and family. Home medications discussed. Next due time for medications was written on paper. Follow up appointment is to be made. Prescription was given. Diet, activity and reasons to call the doctor discussed. Patient verbalized understanding of instructions.

## 2016-03-16 NOTE — Discharge Summary (Signed)
03/16/2016 11:29 AM  [name] [id number]  Post-Op Day 1    Temp:  [97.5 F (36.4 C)-98.5 F (36.9 C)] 98.2 F (36.8 C) (02/18 0614) Pulse Rate:  [63-78] 73 (02/18 0614) Resp:  [17-28] 17 (02/18 0614) BP: (104-156)/(55-84) 109/59 (02/18 0614) SpO2:  [90 %-100 %] 90 % (02/18 1000) Weight:  [168 lb (76.2 kg)] 168 lb (76.2 kg) (02/17 1630),     Intake/Output Summary (Last 24 hours) at 03/16/16 1129 Last data filed at 03/16/16 1039  Gross per 24 hour  Intake             2740 ml  Output              550 ml  Net             2190 ml    No results found for this or any previous visit (from the past 24 hour(s)).  SUBJECTIVE:  Nose tender.  No SOB or chest pain  OBJECTIVE:  Color/energy OK.  Nasal packs removed with no bleeding.  IMPRESSION:  Satisfactory check  PLAN:  Discharge to home and care of family  Admit:17 FEB Discharge:  18 FEB Final Diagnosis:  Nasal septal deviation.  RIGHT epistaxis Proc:  Nasal septoplasty, endoscopic control of epistaxis Comp:  None Cond:  Ambulatory .  Breathing well. Eating and drinking.  Voiding.   Recheck:  My office 9 days Rx's: pt has analgesics and antibiotics at home Instructions written and given  Hosp Course:  Underwent thorough eval in my office day before admission. Recurrent bleeding.  Taken to OR.  Septum straightened for access.  No obvious active bleeding sites noted.  Cautery and packing performed.    No post op issues including nasal bleeding. Packs removed and discharged to home and care of family.  Will remove septal splints in 9 days.    Jodi Marble

## 2016-03-21 DIAGNOSIS — R69 Illness, unspecified: Secondary | ICD-10-CM | POA: Diagnosis not present

## 2016-03-21 DIAGNOSIS — R040 Epistaxis: Secondary | ICD-10-CM | POA: Diagnosis not present

## 2016-03-21 DIAGNOSIS — M545 Low back pain: Secondary | ICD-10-CM | POA: Diagnosis not present

## 2016-04-12 DIAGNOSIS — M545 Low back pain: Secondary | ICD-10-CM | POA: Diagnosis not present

## 2016-04-12 DIAGNOSIS — J449 Chronic obstructive pulmonary disease, unspecified: Secondary | ICD-10-CM | POA: Diagnosis not present

## 2016-04-12 DIAGNOSIS — R69 Illness, unspecified: Secondary | ICD-10-CM | POA: Diagnosis not present

## 2016-04-12 DIAGNOSIS — Z79899 Other long term (current) drug therapy: Secondary | ICD-10-CM | POA: Diagnosis not present

## 2016-04-12 DIAGNOSIS — D473 Essential (hemorrhagic) thrombocythemia: Secondary | ICD-10-CM | POA: Diagnosis not present

## 2016-04-12 DIAGNOSIS — E785 Hyperlipidemia, unspecified: Secondary | ICD-10-CM | POA: Diagnosis not present

## 2016-05-07 DIAGNOSIS — E785 Hyperlipidemia, unspecified: Secondary | ICD-10-CM | POA: Diagnosis not present

## 2016-05-07 DIAGNOSIS — J449 Chronic obstructive pulmonary disease, unspecified: Secondary | ICD-10-CM | POA: Diagnosis not present

## 2016-05-07 DIAGNOSIS — R69 Illness, unspecified: Secondary | ICD-10-CM | POA: Diagnosis not present

## 2016-05-07 DIAGNOSIS — M545 Low back pain: Secondary | ICD-10-CM | POA: Diagnosis not present

## 2016-05-08 ENCOUNTER — Ambulatory Visit

## 2016-05-09 DIAGNOSIS — M545 Low back pain: Secondary | ICD-10-CM | POA: Diagnosis not present

## 2016-05-09 DIAGNOSIS — Z79899 Other long term (current) drug therapy: Secondary | ICD-10-CM | POA: Diagnosis not present

## 2016-05-09 DIAGNOSIS — R69 Illness, unspecified: Secondary | ICD-10-CM | POA: Diagnosis not present

## 2016-05-09 DIAGNOSIS — E559 Vitamin D deficiency, unspecified: Secondary | ICD-10-CM | POA: Diagnosis not present

## 2016-05-09 DIAGNOSIS — I10 Essential (primary) hypertension: Secondary | ICD-10-CM | POA: Diagnosis not present

## 2016-06-03 DIAGNOSIS — R05 Cough: Secondary | ICD-10-CM | POA: Diagnosis not present

## 2016-06-03 DIAGNOSIS — Z79899 Other long term (current) drug therapy: Secondary | ICD-10-CM | POA: Diagnosis not present

## 2016-06-03 DIAGNOSIS — M545 Low back pain: Secondary | ICD-10-CM | POA: Diagnosis not present

## 2016-06-03 DIAGNOSIS — R17 Unspecified jaundice: Secondary | ICD-10-CM | POA: Diagnosis not present

## 2016-06-03 DIAGNOSIS — J449 Chronic obstructive pulmonary disease, unspecified: Secondary | ICD-10-CM | POA: Diagnosis not present

## 2016-06-20 DIAGNOSIS — Z79899 Other long term (current) drug therapy: Secondary | ICD-10-CM | POA: Diagnosis not present

## 2016-06-20 DIAGNOSIS — J449 Chronic obstructive pulmonary disease, unspecified: Secondary | ICD-10-CM | POA: Diagnosis not present

## 2016-06-20 DIAGNOSIS — R69 Illness, unspecified: Secondary | ICD-10-CM | POA: Diagnosis not present

## 2016-06-20 DIAGNOSIS — M545 Low back pain: Secondary | ICD-10-CM | POA: Diagnosis not present

## 2016-06-20 DIAGNOSIS — E785 Hyperlipidemia, unspecified: Secondary | ICD-10-CM | POA: Diagnosis not present

## 2016-07-18 DIAGNOSIS — Z79899 Other long term (current) drug therapy: Secondary | ICD-10-CM | POA: Diagnosis not present

## 2016-07-18 DIAGNOSIS — M25562 Pain in left knee: Secondary | ICD-10-CM | POA: Diagnosis not present

## 2016-07-18 DIAGNOSIS — M545 Low back pain: Secondary | ICD-10-CM | POA: Diagnosis not present

## 2016-07-18 DIAGNOSIS — I10 Essential (primary) hypertension: Secondary | ICD-10-CM | POA: Diagnosis not present

## 2016-07-18 DIAGNOSIS — J449 Chronic obstructive pulmonary disease, unspecified: Secondary | ICD-10-CM | POA: Diagnosis not present

## 2016-07-19 DIAGNOSIS — E785 Hyperlipidemia, unspecified: Secondary | ICD-10-CM | POA: Diagnosis not present

## 2016-07-19 DIAGNOSIS — Z0000 Encounter for general adult medical examination without abnormal findings: Secondary | ICD-10-CM | POA: Diagnosis not present

## 2016-07-19 DIAGNOSIS — Z6821 Body mass index (BMI) 21.0-21.9, adult: Secondary | ICD-10-CM | POA: Diagnosis not present

## 2016-07-19 DIAGNOSIS — Z7982 Long term (current) use of aspirin: Secondary | ICD-10-CM | POA: Diagnosis not present

## 2016-07-19 DIAGNOSIS — G3184 Mild cognitive impairment, so stated: Secondary | ICD-10-CM | POA: Diagnosis not present

## 2016-07-19 DIAGNOSIS — H9113 Presbycusis, bilateral: Secondary | ICD-10-CM | POA: Diagnosis not present

## 2016-07-19 DIAGNOSIS — K649 Unspecified hemorrhoids: Secondary | ICD-10-CM | POA: Diagnosis not present

## 2016-07-19 DIAGNOSIS — M545 Low back pain: Secondary | ICD-10-CM | POA: Diagnosis not present

## 2016-07-19 DIAGNOSIS — I209 Angina pectoris, unspecified: Secondary | ICD-10-CM | POA: Diagnosis not present

## 2016-07-19 DIAGNOSIS — M130 Polyarthritis, unspecified: Secondary | ICD-10-CM | POA: Diagnosis not present

## 2016-07-19 DIAGNOSIS — I10 Essential (primary) hypertension: Secondary | ICD-10-CM | POA: Diagnosis not present

## 2016-07-19 DIAGNOSIS — R233 Spontaneous ecchymoses: Secondary | ICD-10-CM | POA: Diagnosis not present

## 2016-07-19 DIAGNOSIS — G9009 Other idiopathic peripheral autonomic neuropathy: Secondary | ICD-10-CM | POA: Diagnosis not present

## 2016-07-19 DIAGNOSIS — D509 Iron deficiency anemia, unspecified: Secondary | ICD-10-CM | POA: Diagnosis not present

## 2016-07-19 DIAGNOSIS — R69 Illness, unspecified: Secondary | ICD-10-CM | POA: Diagnosis not present

## 2016-07-19 DIAGNOSIS — I252 Old myocardial infarction: Secondary | ICD-10-CM | POA: Diagnosis not present

## 2016-08-15 DIAGNOSIS — I10 Essential (primary) hypertension: Secondary | ICD-10-CM | POA: Diagnosis not present

## 2016-08-15 DIAGNOSIS — E785 Hyperlipidemia, unspecified: Secondary | ICD-10-CM | POA: Diagnosis not present

## 2016-08-15 DIAGNOSIS — Z79899 Other long term (current) drug therapy: Secondary | ICD-10-CM | POA: Diagnosis not present

## 2016-08-15 DIAGNOSIS — R69 Illness, unspecified: Secondary | ICD-10-CM | POA: Diagnosis not present

## 2016-08-15 DIAGNOSIS — M545 Low back pain: Secondary | ICD-10-CM | POA: Diagnosis not present

## 2016-08-19 DIAGNOSIS — M25562 Pain in left knee: Secondary | ICD-10-CM | POA: Diagnosis not present

## 2016-08-19 DIAGNOSIS — I10 Essential (primary) hypertension: Secondary | ICD-10-CM | POA: Diagnosis not present

## 2016-08-19 DIAGNOSIS — Z79899 Other long term (current) drug therapy: Secondary | ICD-10-CM | POA: Diagnosis not present

## 2016-08-19 DIAGNOSIS — E785 Hyperlipidemia, unspecified: Secondary | ICD-10-CM | POA: Diagnosis not present

## 2016-08-19 DIAGNOSIS — J449 Chronic obstructive pulmonary disease, unspecified: Secondary | ICD-10-CM | POA: Diagnosis not present

## 2016-08-19 DIAGNOSIS — M545 Low back pain: Secondary | ICD-10-CM | POA: Diagnosis not present

## 2016-09-02 DIAGNOSIS — Z79899 Other long term (current) drug therapy: Secondary | ICD-10-CM | POA: Diagnosis not present

## 2016-09-02 DIAGNOSIS — J449 Chronic obstructive pulmonary disease, unspecified: Secondary | ICD-10-CM | POA: Diagnosis not present

## 2016-09-02 DIAGNOSIS — I10 Essential (primary) hypertension: Secondary | ICD-10-CM | POA: Diagnosis not present

## 2016-09-02 DIAGNOSIS — M545 Low back pain: Secondary | ICD-10-CM | POA: Diagnosis not present

## 2016-09-09 DIAGNOSIS — Z79899 Other long term (current) drug therapy: Secondary | ICD-10-CM | POA: Diagnosis not present

## 2016-09-09 DIAGNOSIS — M545 Low back pain: Secondary | ICD-10-CM | POA: Diagnosis not present

## 2016-09-09 DIAGNOSIS — E785 Hyperlipidemia, unspecified: Secondary | ICD-10-CM | POA: Diagnosis not present

## 2016-09-09 DIAGNOSIS — I10 Essential (primary) hypertension: Secondary | ICD-10-CM | POA: Diagnosis not present

## 2016-09-09 DIAGNOSIS — R69 Illness, unspecified: Secondary | ICD-10-CM | POA: Diagnosis not present

## 2016-09-09 DIAGNOSIS — M25562 Pain in left knee: Secondary | ICD-10-CM | POA: Diagnosis not present

## 2016-09-11 ENCOUNTER — Encounter (HOSPITAL_COMMUNITY): Payer: Self-pay | Admitting: Emergency Medicine

## 2016-09-11 ENCOUNTER — Observation Stay (HOSPITAL_COMMUNITY)
Admission: EM | Admit: 2016-09-11 | Discharge: 2016-09-12 | Disposition: A | Discharge status: Home / Self Care | Payer: Medicare HMO | Attending: Cardiology | Admitting: Cardiology

## 2016-09-11 ENCOUNTER — Emergency Department (HOSPITAL_COMMUNITY): Payer: Medicare HMO

## 2016-09-11 DIAGNOSIS — R9389 Abnormal findings on diagnostic imaging of other specified body structures: Secondary | ICD-10-CM

## 2016-09-11 DIAGNOSIS — Z79899 Other long term (current) drug therapy: Secondary | ICD-10-CM | POA: Insufficient documentation

## 2016-09-11 DIAGNOSIS — J438 Other emphysema: Secondary | ICD-10-CM

## 2016-09-11 DIAGNOSIS — Z720 Tobacco use: Secondary | ICD-10-CM

## 2016-09-11 DIAGNOSIS — R69 Illness, unspecified: Secondary | ICD-10-CM | POA: Diagnosis not present

## 2016-09-11 DIAGNOSIS — I200 Unstable angina: Secondary | ICD-10-CM

## 2016-09-11 DIAGNOSIS — I10 Essential (primary) hypertension: Secondary | ICD-10-CM | POA: Diagnosis not present

## 2016-09-11 DIAGNOSIS — G894 Chronic pain syndrome: Secondary | ICD-10-CM

## 2016-09-11 DIAGNOSIS — I25110 Atherosclerotic heart disease of native coronary artery with unstable angina pectoris: Principal | ICD-10-CM | POA: Insufficient documentation

## 2016-09-11 DIAGNOSIS — E876 Hypokalemia: Secondary | ICD-10-CM

## 2016-09-11 DIAGNOSIS — J449 Chronic obstructive pulmonary disease, unspecified: Secondary | ICD-10-CM

## 2016-09-11 DIAGNOSIS — I2510 Atherosclerotic heart disease of native coronary artery without angina pectoris: Secondary | ICD-10-CM

## 2016-09-11 DIAGNOSIS — Z23 Encounter for immunization: Secondary | ICD-10-CM | POA: Insufficient documentation

## 2016-09-11 DIAGNOSIS — E785 Hyperlipidemia, unspecified: Secondary | ICD-10-CM | POA: Insufficient documentation

## 2016-09-11 DIAGNOSIS — Z79891 Long term (current) use of opiate analgesic: Secondary | ICD-10-CM | POA: Diagnosis not present

## 2016-09-11 DIAGNOSIS — R079 Chest pain, unspecified: Secondary | ICD-10-CM | POA: Diagnosis not present

## 2016-09-11 DIAGNOSIS — F17210 Nicotine dependence, cigarettes, uncomplicated: Secondary | ICD-10-CM | POA: Insufficient documentation

## 2016-09-11 DIAGNOSIS — I252 Old myocardial infarction: Secondary | ICD-10-CM | POA: Insufficient documentation

## 2016-09-11 DIAGNOSIS — R072 Precordial pain: Secondary | ICD-10-CM | POA: Insufficient documentation

## 2016-09-11 DIAGNOSIS — I429 Cardiomyopathy, unspecified: Secondary | ICD-10-CM | POA: Insufficient documentation

## 2016-09-11 DIAGNOSIS — J439 Emphysema, unspecified: Secondary | ICD-10-CM | POA: Insufficient documentation

## 2016-09-11 DIAGNOSIS — D72829 Elevated white blood cell count, unspecified: Secondary | ICD-10-CM

## 2016-09-11 HISTORY — DX: Pneumonia, unspecified organism: J18.9

## 2016-09-11 HISTORY — DX: Essential (primary) hypertension: I10

## 2016-09-11 HISTORY — DX: Migraine, unspecified, not intractable, without status migrainosus: G43.909

## 2016-09-11 HISTORY — DX: Pure hypercholesterolemia, unspecified: E78.00

## 2016-09-11 HISTORY — DX: Low back pain: M54.5

## 2016-09-11 HISTORY — DX: Personal history of other medical treatment: Z92.89

## 2016-09-11 HISTORY — DX: Low back pain, unspecified: M54.50

## 2016-09-11 HISTORY — DX: Toxic effect of contact with other venomous marine animals, accidental (unintentional), initial encounter: T63.691A

## 2016-09-11 HISTORY — DX: Other chronic pain: G89.29

## 2016-09-11 LAB — CBC WITH DIFFERENTIAL/PLATELET
Basophils Absolute: 0 10*3/uL (ref 0.0–0.1)
Basophils Relative: 0 %
EOS ABS: 0.1 10*3/uL (ref 0.0–0.7)
EOS PCT: 1 %
HCT: 43.6 % (ref 39.0–52.0)
Hemoglobin: 14.6 g/dL (ref 13.0–17.0)
LYMPHS ABS: 2.1 10*3/uL (ref 0.7–4.0)
Lymphocytes Relative: 19 %
MCH: 29.4 pg (ref 26.0–34.0)
MCHC: 33.5 g/dL (ref 30.0–36.0)
MCV: 87.7 fL (ref 78.0–100.0)
MONO ABS: 0.6 10*3/uL (ref 0.1–1.0)
MONOS PCT: 6 %
Neutro Abs: 8.2 10*3/uL — ABNORMAL HIGH (ref 1.7–7.7)
Neutrophils Relative %: 74 %
PLATELETS: 458 10*3/uL — AB (ref 150–400)
RBC: 4.97 MIL/uL (ref 4.22–5.81)
RDW: 17.2 % — AB (ref 11.5–15.5)
WBC: 10.9 10*3/uL — AB (ref 4.0–10.5)

## 2016-09-11 LAB — COMPREHENSIVE METABOLIC PANEL
ALBUMIN: 3.4 g/dL — AB (ref 3.5–5.0)
ALK PHOS: 109 U/L (ref 38–126)
ALT: 14 U/L — AB (ref 17–63)
ANION GAP: 11 (ref 5–15)
AST: 17 U/L (ref 15–41)
BILIRUBIN TOTAL: 1.5 mg/dL — AB (ref 0.3–1.2)
BUN: 20 mg/dL (ref 6–20)
CALCIUM: 9.1 mg/dL (ref 8.9–10.3)
CO2: 24 mmol/L (ref 22–32)
CREATININE: 0.96 mg/dL (ref 0.61–1.24)
Chloride: 104 mmol/L (ref 101–111)
GFR calc Af Amer: >60 mL/min (ref >60)
GFR calc non Af Amer: >60 mL/min (ref >60)
GLUCOSE: 116 mg/dL — AB (ref 65–99)
Potassium: 3.4 mmol/L — ABNORMAL LOW (ref 3.5–5.1)
Sodium: 139 mmol/L (ref 135–145)
TOTAL PROTEIN: 7.6 g/dL (ref 6.5–8.1)

## 2016-09-11 LAB — PROTIME-INR
INR: 0.97
PROTHROMBIN TIME: 12.9 s (ref 11.4–15.2)

## 2016-09-11 LAB — TROPONIN I
Troponin I: <0.03 ng/mL (ref <0.03)
Troponin I: <0.03 ng/mL (ref <0.03)

## 2016-09-11 LAB — HEPARIN LEVEL (UNFRACTIONATED): Heparin Unfractionated: 0.25 IU/mL — ABNORMAL LOW (ref 0.30–0.70)

## 2016-09-11 LAB — LIPASE, BLOOD: Lipase: 42 U/L (ref 11–51)

## 2016-09-11 MED ORDER — HEPARIN (PORCINE) IN NACL 100-0.45 UNIT/ML-% IJ SOLN
1200.0000 [IU]/h | INTRAMUSCULAR | Status: DC
  Administered 2016-09-11: 850 [IU]/h via INTRAVENOUS
  Filled 2016-09-11: qty 250

## 2016-09-11 MED ORDER — NITROGLYCERIN IN D5W 200-5 MCG/ML-% IV SOLN: 5.0000 ug/min | Freq: Once | INTRAVENOUS | Status: DC

## 2016-09-11 MED ORDER — ALPRAZOLAM 0.25 MG PO TABS: 0.2500 mg | ORAL_TABLET | Freq: Two times a day (BID) | ORAL | Status: DC | PRN

## 2016-09-11 MED ORDER — SIMVASTATIN 20 MG PO TABS
10.0000 mg | ORAL_TABLET | Freq: Every day | ORAL | Status: DC
Start: 2016-09-12 — End: 2016-09-12
  Administered 2016-09-12: 10 mg via ORAL
  Filled 2016-09-11: qty 1

## 2016-09-11 MED ORDER — FERROUS SULFATE 325 (65 FE) MG PO TABS: 325.0000 mg | ORAL_TABLET | Freq: Every day | ORAL | Status: DC

## 2016-09-11 MED ORDER — ASPIRIN 81 MG PO CHEW
81.0000 mg | CHEWABLE_TABLET | ORAL | Status: AC
  Administered 2016-09-12: 08:00:00 81 mg via ORAL
  Filled 2016-09-11: qty 1

## 2016-09-11 MED ORDER — METOPROLOL SUCCINATE ER 25 MG PO TB24
25.0000 mg | ORAL_TABLET | Freq: Every day | ORAL | Status: DC
Start: 2016-09-12 — End: 2016-09-12
  Administered 2016-09-12: 25 mg via ORAL
  Filled 2016-09-11: qty 1

## 2016-09-11 MED ORDER — ONDANSETRON HCL 4 MG/2ML IJ SOLN: 4.0000 mg | Freq: Four times a day (QID) | INTRAMUSCULAR | Status: DC | PRN

## 2016-09-11 MED ORDER — ASPIRIN 81 MG PO CHEW
324.0000 mg | CHEWABLE_TABLET | Freq: Once | ORAL | Status: AC
  Administered 2016-09-11: 324 mg via ORAL
  Filled 2016-09-11: qty 4

## 2016-09-11 MED ORDER — ONDANSETRON HCL 4 MG PO TABS
4.0000 mg | ORAL_TABLET | Freq: Four times a day (QID) | ORAL | Status: DC | PRN
Start: 2016-09-11 — End: 2016-09-12

## 2016-09-11 MED ORDER — SODIUM CHLORIDE 0.9 % WEIGHT BASED INFUSION
1.0000 mL/kg/h | INTRAVENOUS | Status: DC
  Administered 2016-09-11: 21:00:00 1 mL/kg/h via INTRAVENOUS

## 2016-09-11 MED ORDER — ZOLPIDEM TARTRATE 5 MG PO TABS
5.0000 mg | ORAL_TABLET | Freq: Every evening | ORAL | Status: DC | PRN
  Administered 2016-09-11: 5 mg via ORAL
  Filled 2016-09-11: qty 1

## 2016-09-11 MED ORDER — METOCLOPRAMIDE HCL 5 MG PO TABS
5.0000 mg | ORAL_TABLET | Freq: Three times a day (TID) | ORAL | Status: DC
  Administered 2016-09-11 – 2016-09-12 (×3): 5 mg via ORAL
  Filled 2016-09-11 (×4): qty 1

## 2016-09-11 MED ORDER — NITROGLYCERIN 0.4 MG SL SUBL: 0.4000 mg | SUBLINGUAL_TABLET | SUBLINGUAL | Status: DC | PRN

## 2016-09-11 MED ORDER — HEPARIN BOLUS VIA INFUSION
4000.0000 [IU] | Freq: Once | INTRAVENOUS | Status: AC
  Administered 2016-09-11: 4000 [IU] via INTRAVENOUS

## 2016-09-11 MED ORDER — PANTOPRAZOLE SODIUM 40 MG PO TBEC
40.0000 mg | DELAYED_RELEASE_TABLET | Freq: Every day | ORAL | Status: DC
  Administered 2016-09-12: 11:00:00 40 mg via ORAL
  Filled 2016-09-11 (×2): qty 1

## 2016-09-11 MED ORDER — SODIUM CHLORIDE 0.9 % IV SOLN: 250.0000 mL | INTRAVENOUS | Status: DC | PRN

## 2016-09-11 MED ORDER — PNEUMOCOCCAL VAC POLYVALENT 25 MCG/0.5ML IJ INJ
0.5000 mL | INJECTION | INTRAMUSCULAR | Status: AC
  Administered 2016-09-12: 0.5 mL via INTRAMUSCULAR
  Filled 2016-09-11: qty 0.5

## 2016-09-11 MED ORDER — DILTIAZEM HCL ER COATED BEADS 240 MG PO CP24
240.0000 mg | ORAL_CAPSULE | Freq: Every day | ORAL | Status: DC
  Administered 2016-09-12: 240 mg via ORAL
  Filled 2016-09-11: qty 1

## 2016-09-11 MED ORDER — NITROGLYCERIN IN D5W 200-5 MCG/ML-% IV SOLN
5.0000 ug/min | INTRAVENOUS | Status: DC
  Administered 2016-09-11: 21:00:00 30 ug/min via INTRAVENOUS

## 2016-09-11 MED ORDER — ACETAMINOPHEN 325 MG PO TABS: 650.0000 mg | ORAL_TABLET | Freq: Four times a day (QID) | ORAL | Status: DC | PRN

## 2016-09-11 MED ORDER — NORTRIPTYLINE HCL 10 MG PO CAPS
30.0000 mg | ORAL_CAPSULE | Freq: Every day | ORAL | Status: DC
  Administered 2016-09-11: 30 mg via ORAL
  Filled 2016-09-11: qty 3

## 2016-09-11 MED ORDER — FERROUS SULFATE 325 (65 FE) MG PO TABS
325.0000 mg | ORAL_TABLET | Freq: Every day | ORAL | Status: DC
  Administered 2016-09-12: 08:00:00 325 mg via ORAL
  Filled 2016-09-11: qty 1

## 2016-09-11 MED ORDER — GABAPENTIN 300 MG PO CAPS
300.0000 mg | ORAL_CAPSULE | Freq: Three times a day (TID) | ORAL | Status: DC
  Administered 2016-09-11 – 2016-09-12 (×2): 300 mg via ORAL
  Filled 2016-09-11 (×2): qty 1

## 2016-09-11 MED ORDER — ACETAMINOPHEN 650 MG RE SUPP: 650.0000 mg | Freq: Four times a day (QID) | RECTAL | Status: DC | PRN

## 2016-09-11 MED ORDER — NITROGLYCERIN IN D5W 200-5 MCG/ML-% IV SOLN
5.0000 ug/min | Freq: Once | INTRAVENOUS | Status: AC
  Administered 2016-09-11: 5 ug/min via INTRAVENOUS
  Filled 2016-09-11: qty 250

## 2016-09-11 MED ORDER — OXYCODONE HCL 5 MG PO TABS
10.0000 mg | ORAL_TABLET | Freq: Three times a day (TID) | ORAL | Status: DC
  Administered 2016-09-11 – 2016-09-12 (×2): 10 mg via ORAL
  Filled 2016-09-11 (×2): qty 2

## 2016-09-11 MED ORDER — MOMETASONE FURO-FORMOTEROL FUM 200-5 MCG/ACT IN AERO
2.0000 | INHALATION_SPRAY | Freq: Two times a day (BID) | RESPIRATORY_TRACT | Status: DC
  Administered 2016-09-12: 2 via RESPIRATORY_TRACT
  Filled 2016-09-11: qty 8.8

## 2016-09-11 MED ORDER — SODIUM CHLORIDE 0.9% FLUSH
3.0000 mL | Freq: Two times a day (BID) | INTRAVENOUS | Status: DC
  Administered 2016-09-11 – 2016-09-12 (×2): 3 mL via INTRAVENOUS

## 2016-09-11 MED ORDER — ACETAMINOPHEN 325 MG PO TABS
650.0000 mg | ORAL_TABLET | ORAL | Status: DC | PRN
Start: 2016-09-11 — End: 2016-09-11

## 2016-09-11 MED ORDER — ASPIRIN EC 325 MG PO TBEC
325.0000 mg | DELAYED_RELEASE_TABLET | Freq: Every day | ORAL | Status: DC
  Administered 2016-09-12: 325 mg via ORAL

## 2016-09-11 NOTE — H&P (Signed)
History and Physical  Bradley [name] JOI:[id number] DOB: [birth year] DOA: 09/11/2016   PCP: Jani Gravel, MD   Patient coming from: Home  Chief Complaint: chest pain  HPI:  Bradley [name] is a 76 y.o. male with medical history ofwith a history of COPD, hyperlipidemia, hypertension, coronary artery disease with history of MI, and chronic pain presents with 3-4 day history of chest pain. The patient states that his chest pain is sharp in nature and substernal and has been constant for the past 3-4 days. He states that movement as well as exertion has made it worse. The patient took one sublingual nitroglycerin which did not completely eliminate his pain, but improved his pain. He denies any dizziness, nausea, vomiting, diarrhea, fevers, chills. He says that his shortness breath is about the same as usual. Because of persistent chest pain and increasing lethargy, the patient presented to the emergency department.  In the emergency department, the patient was afebrile and hemodynamically stable saturating 96% on room air. Potassium was 3.4. Otherwise BMP and LFTs were unremarkable. WBC was 10.9, hemoglobin was 14.6, platelets 458,000. EKG shows sinus rhythm with nonspecific T-wave changes. Troponin was negative initially. Chest x-ray showed interstitial prominence. The patient was started on intravenous heparin and intravenous nitroglycerin with improvement of his chest pain.  Assessment/Plan: Unstable angina -Continue IV nitroglycerin and IV heparin -I have consulted cardiology -Cycle troponins -Echocardiogram -Continue metoprolol succinate -Holding home dose imdur while on IV nitro  Coronary artery disease with history of MI -Continue aspirin  -I have consulted cardiology -EKG shows sinus rhythm with nonspecific T wave change -Check lipids -Check hemoglobin A1c  COPD/tobacco abuse -Patient continues to smoke -He has a 70-pack-year history -Tobacco cessation discussed -Presently  stable on room air -Continue LABA -Duonebs q 8  Chronic pain syndrome -Continue home dose oxycodone -Continue gabapentin -Continue nortriptyline -no longer takes Opana  Essential hypertension -Continue diltiazem and metoprolol succinate  GERD -Continue Protonix         Past Medical History:  Diagnosis Date  . Coronary artery disease   . High cholesterol   . Hypertension   . Myocardial infarction [facility])    Past Surgical History:  Procedure Laterality Date  . APPENDECTOMY    . NASAL ENDOSCOPY WITH EPISTAXIS CONTROL Right 03/15/2016   Procedure: NASAL ENDOSCOPY WITH EPISTAXIS CONTROL;  Surgeon: Jodi Marble, MD;  Location: [place];  Service: ENT;  Laterality: Right;  . SEPTOPLASTY Right 03/15/2016   Procedure: SEPTOPLASTY;  Surgeon: Jodi Marble, MD;  Location: [place];  Service: ENT;  Laterality: Right;  . SINUS EXPLORATION Right 03/15/2016   Procedure: SINUS EXPLORATION;  Surgeon: Jodi Marble, MD;  Location: [place];  Service: ENT;  Laterality: Right;  . TONSILLECTOMY    . TOOTH EXTRACTION     Social History:  reports that he has been smoking.  He has never used smokeless tobacco. He reports that he does not drink alcohol or use drugs. He has a 70-pack-year history of tobacco   Family history: family history reviewed--no pertinent family history  No Known Allergies   Prior to Admission medications   Medication Sig Start Date End Date Taking? Authorizing Provider  ADVAIR HFA 115-21 MCG/ACT inhaler Inhale 2 puffs into the lungs every 12 (twelve) hours. 01/29/16   [provider]  diltiazem (CARDIZEM CD) 240 MG 24 hr capsule Take 1 capsule by mouth daily. 12/23/15   [provider]  ferrous sulfate 325 (65 FE) MG tablet Take 325 mg by mouth  daily. 12/10/15   [provider]  isosorbide mononitrate (ISMO,MONOKET) 20 MG tablet Take 1 tablet by mouth 2 (two) times daily. 12/18/15   [provider]  metoCLOPramide (REGLAN) 5 MG tablet Take 1  tablet by mouth 4 (four) times daily -  before meals and at bedtime. 01/25/16   [provider]  metoprolol succinate (TOPROL-XL) 25 MG 24 hr tablet Take 1 tablet by mouth daily. 02/19/16   [provider]  nortriptyline (PAMELOR) 10 MG capsule Take 3 capsules by mouth at bedtime. 02/10/16   [provider]  omeprazole (PRILOSEC) 20 MG capsule Take 1 capsule by mouth 2 (two) times daily. 02/08/16   [provider]  Oxycodone HCl 10 MG TABS Take 1 tablet by mouth 3 (three) times daily. 02/28/16   [provider]  oxymorphone (OPANA ER) 10 MG 12 hr tablet Take 10 mg by mouth every 12 (twelve) hours.    [provider]  simvastatin (ZOCOR) 10 MG tablet Take 10 mg by mouth daily. 12/23/15   [provider]  VENTOLIN HFA 108 (90 Base) MCG/ACT inhaler Inhale 2 puffs into the lungs every 4 (four) hours as needed for wheezing or shortness of breath.  12/11/15   [provider]    Review of Systems:  Constitutional:  No weight loss, night sweats, Fevers, chills, fatigue.  Head&Eyes: No headache.  No vision loss.  No eye pain or scotoma ENT:  No Difficulty swallowing,Tooth/dental problems,Sore throat,  No ear ache, post nasal drip,  Cardio-vascular:  No  Orthopnea, PND, swelling in lower extremities,  dizziness, palpitations  GI:  No  abdominal pain, nausea, vomiting, diarrhea, loss of appetite, hematochezia, melena, heartburn, indigestion, Resp:  He has some shortness of breath with exertion unchanged from baseline . No cough. No coughing up of blood .No wheezing.No chest wall deformity  Skin:  no rash or lesions.  GU:  no dysuria, change in color of urine, no urgency or frequency. No flank pain.  Musculoskeletal:  No joint pain or swelling. No decreased range of motion. No back pain.  Psych:  No change in mood or affect. No depression or anxiety. Neurologic: No headache, no dysesthesia, no focal weakness, no vision loss. No  syncope  Physical Exam: Vitals:   09/11/16 1030 09/11/16 1100 09/11/16 1115 09/11/16 1130  BP: 130/85 130/88  (!) 151/98  Pulse: 74 71 71 70  Resp: 14 (!) 21 20 18   Temp:      SpO2: 97% 96% 96% 95%  Weight:      Height:       General:  A&O x 3, NAD, nontoxic, pleasant/cooperative Head/Eye: No conjunctival hemorrhage, no icterus, [place]/AT, No nystagmus ENT:  No icterus,  No thrush, good dentition, no pharyngeal exudate Neck:  No masses, no lymphadenpathy, no bruits CV:  RRR, no rub, no gallop, no S3 Lung:  Diminished breath sounds bilateral without wheezing.  Abdomen: soft/NT, +BS, nondistended, no peritoneal signs Ext: No cyanosis, No rashes, No petechiae, No lymphangitis, No edema Neuro: CNII-XII intact, strength 4/5 in bilateral upper and lower extremities, no dysmetria  Labs on Admission:  Basic Metabolic Panel:  Recent Labs Lab 09/11/16 1013  NA 139  K 3.4*  CL 104  CO2 24  GLUCOSE 116*  BUN 20  CREATININE 0.96  CALCIUM 9.1   Liver Function Tests:  Recent Labs Lab 09/11/16 1013  AST 17  ALT 14*  ALKPHOS 109  BILITOT 1.5*  PROT 7.6  ALBUMIN 3.4*  Recent Labs Lab 09/11/16 1013  LIPASE 42   No results for input(s): AMMONIA in the last 168 hours. CBC:  Recent Labs Lab 09/11/16 1013  WBC 10.9*  NEUTROABS 8.2*  HGB 14.6  HCT 43.6  MCV 87.7  PLT 458*   Coagulation Profile: No results for input(s): INR, PROTIME in the last 168 hours. Cardiac Enzymes:  Recent Labs Lab 09/11/16 1013  TROPONINI <0.03   BNP: Invalid input(s): POCBNP CBG: No results for input(s): GLUCAP in the last 168 hours. Urine analysis: No results found for: COLORURINE, APPEARANCEUR, LABSPEC, PHURINE, GLUCOSEU, HGBUR, BILIRUBINUR, KETONESUR, PROTEINUR, UROBILINOGEN, NITRITE, LEUKOCYTESUR Sepsis Labs: @LABRCNTIP (procalcitonin:4,lacticidven:4) )No results found for this or any previous visit (from the past 240 hour(s)).   Radiological Exams on Admission: Dg Chest 2  View  Result Date: 09/11/2016 CLINICAL DATA:  Chest pain. EXAM: CHEST  2 VIEW COMPARISON:  No recent prior. FINDINGS: Mediastinum and hilar structures normal. Lungs are clear of acute infiltrates. Heart size normal. Mild bilateral pulmonary interstitial prominence noted. This is unchanged and consistent with chronic interstitial lung disease. No acute bony abnormality. IMPRESSION: Mild bilateral from interstitial prominence noted consistent chronic interstitial lung disease. No acute infiltrate noted. Electronically Signed   By: Marcello Moores  Register   On: 09/11/2016 11:17    EKG: Independently reviewed. Sinus rhythm, nonspecific T-wave change    Time spent:60 minutes Code Status:   FULL Family Communication:  No Family at bedside Disposition Plan: expect 2-3 day hospitalization Consults called: cardiology DVT Prophylaxis: IV heparin  Lavina Resor, DO  [facility] Pager [phone]  If 7PM-7AM, please contact night-coverage [email or web address] Password [email or web address] 09/11/2016, 12:11 PM

## 2016-09-11 NOTE — Progress Notes (Signed)
ANTICOAGULATION CONSULT NOTE - Follow Up Consult  Pharmacy Consult for heparin Indication: chest pain/ACS  No Known Allergies  Patient Measurements: Height: 6' (182.9 cm) Weight: 155 lb (70.3 kg) IBW/kg (Calculated) : 77.6 Heparin Dosing Weight: 70.3 kg  Vital Signs: Temp: 97 F (36.1 C) (08/16 1600) Temp Source: Oral (08/16 1600) BP: 143/90 (08/16 1600) Pulse Rate: 64 (08/16 1600)  Labs:  Recent Labs  09/11/16 1013 09/11/16 1017 09/11/16 1743  HGB 14.6  --   --   HCT 43.6  --   --   PLT 458*  --   --   LABPROT  --  12.9  --   INR  --  0.97  --   HEPARINUNFRC  --   --  0.25*  CREATININE 0.96  --   --   TROPONINI <0.03  --   --     Estimated Creatinine Clearance: 66.1 mL/min (by C-G formula based on SCr of 0.96 mg/dL).   Medications:  Scheduled:  . [START ON 09/12/2016] aspirin  81 mg Oral Pre-Cath  . aspirin EC  325 mg Oral Daily  . [START ON 09/12/2016] diltiazem  240 mg Oral Daily  . [START ON 09/12/2016] ferrous sulfate  325 mg Oral Q breakfast  . gabapentin  300 mg Oral TID  . metoCLOPramide  5 mg Oral TID AC & HS  . [START ON 09/12/2016] metoprolol succinate  25 mg Oral Daily  . mometasone-formoterol  2 puff Inhalation BID  . nortriptyline  30 mg Oral QHS  . oxyCODONE  10 mg Oral TID  . [START ON 09/12/2016] pantoprazole  40 mg Oral Daily  . [START ON 09/12/2016] simvastatin  10 mg Oral Daily  . sodium chloride flush  3 mL Intravenous Q12H   Infusions:  . sodium chloride    . sodium chloride    . heparin 850 Units/hr (09/11/16 1151)  . nitroGLYCERIN      Assessment: Bradley [name] is a 71 YOM with a PMH of CAD and MI who presented to the ED at [facility] with unstable angina. He was started on IV heparin 850 units/hr for his chest pain. He was transferred to [facility] for a left heart catheterization scheduled for tomorrow. His most recent Anti Xa level was subtherapeutic at 0.25 IU/mL. Plan to adjust his heparin dose accordingly.  Goal of Therapy:   Heparin level 0.3-0.7 units/ml Monitor platelets by anticoagulation protocol: Yes   Plan:  Increase heparin infusion to 1000 units/hr. Recheck Anti Xa level in 6 hours.  Bradley [name] 09/11/2016,7:16 PM

## 2016-09-11 NOTE — ED Triage Notes (Signed)
Cp x 3 days. N/sweating/dizziness/weakness. Pt took 1 sl nitro pta.
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
## 2016-09-11 NOTE — Progress Notes (Signed)
ANTICOAGULATION CONSULT NOTE - Initial Consult  Pharmacy Consult for Heparin Indication: chest pain/ACS  No Known Allergies  Patient Measurements: Height: 6' (182.9 cm) Weight: 155 lb (70.3 kg) IBW/kg (Calculated) : 77.6  HEPARIN DW (KG): 70.3  Vital Signs: Temp: 97.5 F (36.4 C) (08/16 0939) BP: 151/98 (08/16 1130) Pulse Rate: 70 (08/16 1130)  Labs:  Recent Labs  09/11/16 1013  HGB 14.6  HCT 43.6  PLT 458*  CREATININE 0.96  TROPONINI <0.03    Estimated Creatinine Clearance: 66.1 mL/min (by C-G formula based on SCr of 0.96 mg/dL).   Medical History: Past Medical History:  Diagnosis Date  . Coronary artery disease   . High cholesterol   . Hypertension   . Myocardial infarction [facility])     Medications:  See med rec  Assessment: 76 yo male present with chest pain for 3 days. Pharmacy asked to start heparin.  Goal of Therapy:  Heparin level 0.3-0.7 units/ml Monitor platelets by anticoagulation protocol: Yes   Plan:  Give 4000 units bolus x 1 Start heparin infusion at 850 units/hr Check anti-Xa level in 6-8 hours and daily while on heparin Continue to monitor H&H and platelets  Isac Sarna, BS Vena [place], BCPS Clinical Pharmacist Pager [phone] 09/11/2016,11:37 AM

## 2016-09-11 NOTE — Consult Note (Signed)
Cardiology Consultation:   Patient ID: Bradley [name]; [id number]; 04-Dec-1940   Admit date: 09/11/2016 Date of Consult: 09/11/2016  Primary Care Provider: Jani Gravel, MD Primary Cardiologist: Bronson Ing (new)   Patient Profile:   Bradley [name] is a 76 y.o. male with a hx of CAD and MI who is being seen today for the evaluation of chest pain at the request of Dr. Carles Collet.  History of Present Illness:   Mr. Pooley is a 76 year old male with a history of coronary artery disease and MI. He said his first myocardial infarction was in December 1994 and a second one was in February 1995. He denies undergoing coronary angiography at that time but does report cardiac catheterizations being performed in 1996 and 2002. No PCI was done on either occasion. These were done in [place], [place]. He is originally from [place] but left in the early 1960s and moved back one year ago.  He has been experiencing progressive exertional chest pain which he describes as a "bandlike vice sensation"accompanied by exertional dyspnea and alleviated with rest. He has felt progressively fatigued over the past one month. About 3 days ago he had significant worsening of chest pain to the point where he was lying in bed. His daughter encouraged him to be evaluated in the ED.  He is now on IV heparin. His chest pain had been rated at 9/10 and is now down to 1/10 with 20 mcg/min of IV nitroglycerin.  He has smoked since the age of 51. He used to smoke 4 packs of cigarettes daily and is now down to a half pack of cigarettes daily.  Initial troponin is normal. He is mildly hypokalemic, potassium 3.4. He has a leukocytosis, WBC count 10.9. INR is normal.  Lipid panel and HbA1c have been ordered.  Chest x-ray shows mild bilateral interstitial prominence consistent with chronic interstitial lung disease. There were no acute infiltrates and no evidence of pulmonary edema.  ECG which I personally interpreted  demonstrated sinus rhythm with no acute ischemic changes nor any arrhythmias.     Past Medical History:  Diagnosis Date  . Coronary artery disease   . High cholesterol   . Hypertension   . Myocardial infarction [facility])     Past Surgical History:  Procedure Laterality Date  . APPENDECTOMY    . NASAL ENDOSCOPY WITH EPISTAXIS CONTROL Right 03/15/2016   Procedure: NASAL ENDOSCOPY WITH EPISTAXIS CONTROL;  Surgeon: Jodi Marble, MD;  Location: [place];  Service: ENT;  Laterality: Right;  . SEPTOPLASTY Right 03/15/2016   Procedure: SEPTOPLASTY;  Surgeon: Jodi Marble, MD;  Location: [place];  Service: ENT;  Laterality: Right;  . SINUS EXPLORATION Right 03/15/2016   Procedure: SINUS EXPLORATION;  Surgeon: Jodi Marble, MD;  Location: [place];  Service: ENT;  Laterality: Right;  . TONSILLECTOMY    . TOOTH EXTRACTION       Inpatient Medications: Scheduled Meds:  Continuous Infusions: . heparin 850 Units/hr (09/11/16 1151)   PRN Meds:   Allergies:   No Known Allergies  Social History:   Social History   Social History  . Marital status: Widowed    Spouse name: N/A  . Number of children: N/A  . Years of education: N/A   Occupational History  . Not on file.   Social History Main Topics  . Smoking status: Current Every Day Smoker  . Smokeless tobacco: Never Used  . Alcohol use No  . Drug use: No  . Sexual activity: Not on file   Other  Topics Concern  . Not on file   Social History Narrative  . No narrative on file    Family History:   No premature CAD.  ROS:  Please see the history of present illness.  ROS  All other ROS reviewed and negative.     Physical Exam/Data:   Vitals:   09/11/16 1100 09/11/16 1115 09/11/16 1130 09/11/16 1230  BP: 130/88  (!) 151/98 (!) 159/86  Pulse: 71 71 70 69  Resp: (!) 21 20 18 20   Temp:      SpO2: 96% 96% 95% 96%  Weight:      Height:       No intake or output data in the 24 hours ending 09/11/16 1257 Filed Weights    09/11/16 0939  Weight: 155 lb (70.3 kg)   Body mass index is 21.02 kg/m.  General:  Well nourished, well developed, in no acute distress HEENT: normal Lymph: no adenopathy Neck: no JVD Endocrine:  No thryomegaly Vascular: No carotid bruits Cardiac:  normal S1, S2; RRR; no murmur  Lungs:  Diminished throughout, no crackles or wheezes.  Abd: soft, nontender, no hepatomegaly  Ext: no edema Musculoskeletal:  No deformities, BUE and BLE strength normal and equal Skin: warm and dry  Neuro:  CNs 2-12 intact, no focal abnormalities noted Psych:  Normal affect    Relevant CV Studies: None  Laboratory Data:  Chemistry Recent Labs Lab 09/11/16 1013  NA 139  K 3.4*  CL 104  CO2 24  GLUCOSE 116*  BUN 20  CREATININE 0.96  CALCIUM 9.1  GFRNONAA >60  GFRAA >60  ANIONGAP 11     Recent Labs Lab 09/11/16 1013  PROT 7.6  ALBUMIN 3.4*  AST 17  ALT 14*  ALKPHOS 109  BILITOT 1.5*   Hematology Recent Labs Lab 09/11/16 1013  WBC 10.9*  RBC 4.97  HGB 14.6  HCT 43.6  MCV 87.7  MCH 29.4  MCHC 33.5  RDW 17.2*  PLT 458*   Cardiac Enzymes Recent Labs Lab 09/11/16 1013  TROPONINI <0.03   No results for input(s): TROPIPOC in the last 168 hours.  BNPNo results for input(s): BNP, PROBNP in the last 168 hours.  DDimer No results for input(s): DDIMER in the last 168 hours.  Radiology/Studies:  Dg Chest 2 View  Result Date: 09/11/2016 CLINICAL DATA:  Chest pain. EXAM: CHEST  2 VIEW COMPARISON:  No recent prior. FINDINGS: Mediastinum and hilar structures normal. Lungs are clear of acute infiltrates. Heart size normal. Mild bilateral pulmonary interstitial prominence noted. This is unchanged and consistent with chronic interstitial lung disease. No acute bony abnormality. IMPRESSION: Mild bilateral from interstitial prominence noted consistent chronic interstitial lung disease. No acute infiltrate noted. Electronically Signed   By: Sims   On: 09/11/2016 11:17     Assessment and Plan:   1. Unstable angina: Given his history of prior MIs and long-standing tobacco abuse, and the need for 20 mcg/m of IV nitroglycerin to keep his chest pain under control, I think the best course of action is coronary angiography. He is currently on aspirin, beta blocker, statin, and IV heparin. Risks and benefits of cardiac catheterization have been discussed with the patient.  These include bleeding, infection, kidney damage, stroke, heart attack, death.  The patient understands these risks and is willing to proceed. We will arrange for transfer to [facility].  2. Hypertension: Blood pressure is currently elevated. He may require additional antihypertensive titration.  3. Hyperlipidemia: Currently on low-dose  statin. Would recommend switching to high intensity statin therapy.  4. Tobacco abuse disorder.   Signed, Kate Sable, MD  09/11/2016 12:57 PM

## 2016-09-12 ENCOUNTER — Other Ambulatory Visit: Payer: Self-pay | Admitting: Physician Assistant

## 2016-09-12 ENCOUNTER — Other Ambulatory Visit (HOSPITAL_COMMUNITY): Payer: Medicare HMO

## 2016-09-12 ENCOUNTER — Encounter (HOSPITAL_COMMUNITY)
Admission: EM | Disposition: A | Discharge status: Home / Self Care | Payer: Self-pay | Source: Home / Self Care | Attending: Emergency Medicine

## 2016-09-12 ENCOUNTER — Encounter (HOSPITAL_COMMUNITY): Payer: Self-pay | Admitting: Cardiovascular Disease

## 2016-09-12 DIAGNOSIS — D72829 Elevated white blood cell count, unspecified: Secondary | ICD-10-CM

## 2016-09-12 DIAGNOSIS — I429 Cardiomyopathy, unspecified: Secondary | ICD-10-CM | POA: Diagnosis not present

## 2016-09-12 DIAGNOSIS — E785 Hyperlipidemia, unspecified: Secondary | ICD-10-CM

## 2016-09-12 DIAGNOSIS — G894 Chronic pain syndrome: Secondary | ICD-10-CM | POA: Diagnosis not present

## 2016-09-12 DIAGNOSIS — F17210 Nicotine dependence, cigarettes, uncomplicated: Secondary | ICD-10-CM | POA: Diagnosis not present

## 2016-09-12 DIAGNOSIS — R072 Precordial pain: Secondary | ICD-10-CM

## 2016-09-12 DIAGNOSIS — I426 Alcoholic cardiomyopathy: Secondary | ICD-10-CM

## 2016-09-12 DIAGNOSIS — E876 Hypokalemia: Secondary | ICD-10-CM | POA: Diagnosis not present

## 2016-09-12 DIAGNOSIS — J439 Emphysema, unspecified: Secondary | ICD-10-CM | POA: Diagnosis not present

## 2016-09-12 DIAGNOSIS — R9389 Abnormal findings on diagnostic imaging of other specified body structures: Secondary | ICD-10-CM

## 2016-09-12 DIAGNOSIS — I252 Old myocardial infarction: Secondary | ICD-10-CM | POA: Diagnosis not present

## 2016-09-12 DIAGNOSIS — I25110 Atherosclerotic heart disease of native coronary artery with unstable angina pectoris: Secondary | ICD-10-CM | POA: Diagnosis not present

## 2016-09-12 DIAGNOSIS — I10 Essential (primary) hypertension: Secondary | ICD-10-CM | POA: Diagnosis not present

## 2016-09-12 DIAGNOSIS — R079 Chest pain, unspecified: Secondary | ICD-10-CM | POA: Diagnosis present

## 2016-09-12 DIAGNOSIS — R69 Illness, unspecified: Secondary | ICD-10-CM | POA: Diagnosis not present

## 2016-09-12 HISTORY — PX: LEFT HEART CATH AND CORONARY ANGIOGRAPHY: CATH118249

## 2016-09-12 LAB — BASIC METABOLIC PANEL
Anion gap: 8 (ref 5–15)
BUN: 20 mg/dL (ref 6–20)
CALCIUM: 8.5 mg/dL — AB (ref 8.9–10.3)
CO2: 26 mmol/L (ref 22–32)
CREATININE: 0.96 mg/dL (ref 0.61–1.24)
Chloride: 106 mmol/L (ref 101–111)
GFR calc non Af Amer: >60 mL/min (ref >60)
Glucose, Bld: 98 mg/dL (ref 65–99)
Potassium: 3.2 mmol/L — ABNORMAL LOW (ref 3.5–5.1)
SODIUM: 140 mmol/L (ref 135–145)

## 2016-09-12 LAB — CBC
HEMATOCRIT: 39.2 % (ref 39.0–52.0)
HEMOGLOBIN: 12.8 g/dL — AB (ref 13.0–17.0)
MCH: 28.8 pg (ref 26.0–34.0)
MCHC: 32.7 g/dL (ref 30.0–36.0)
MCV: 88.3 fL (ref 78.0–100.0)
Platelets: 449 10*3/uL — ABNORMAL HIGH (ref 150–400)
RBC: 4.44 MIL/uL (ref 4.22–5.81)
RDW: 17.2 % — AB (ref 11.5–15.5)
WBC: 13.7 10*3/uL — ABNORMAL HIGH (ref 4.0–10.5)

## 2016-09-12 LAB — PROTIME-INR
INR: 1.05
Prothrombin Time: 13.7 seconds (ref 11.4–15.2)

## 2016-09-12 LAB — LIPID PANEL
Cholesterol: 107 mg/dL (ref 0–200)
HDL: 36 mg/dL — ABNORMAL LOW (ref >40)
LDL Cholesterol: 54 mg/dL (ref 0–99)
Total CHOL/HDL Ratio: 3 RATIO
Triglycerides: 87 mg/dL (ref <150)
VLDL: 17 mg/dL (ref 0–40)

## 2016-09-12 LAB — HEMOGLOBIN A1C
Hgb A1c MFr Bld: 5.4 % (ref 4.8–5.6)
Mean Plasma Glucose: 108 mg/dL

## 2016-09-12 LAB — HEPARIN LEVEL (UNFRACTIONATED): Heparin Unfractionated: 0.2 IU/mL — ABNORMAL LOW (ref 0.30–0.70)

## 2016-09-12 LAB — TROPONIN I: Troponin I: <0.03 ng/mL (ref <0.03)

## 2016-09-12 SURGERY — LEFT HEART CATH AND CORONARY ANGIOGRAPHY
Anesthesia: LOCAL

## 2016-09-12 MED ORDER — HEPARIN (PORCINE) IN NACL 2-0.9 UNIT/ML-% IJ SOLN
INTRAMUSCULAR | Status: AC
  Filled 2016-09-12: qty 500

## 2016-09-12 MED ORDER — HEPARIN SODIUM (PORCINE) 1000 UNIT/ML IJ SOLN
INTRAMUSCULAR | Status: DC | PRN
  Administered 2016-09-12: 3500 [IU] via INTRAVENOUS

## 2016-09-12 MED ORDER — SODIUM CHLORIDE 0.9% FLUSH: 3.0000 mL | INTRAVENOUS | Status: DC | PRN

## 2016-09-12 MED ORDER — SODIUM CHLORIDE 0.9% FLUSH
3.0000 mL | Freq: Two times a day (BID) | INTRAVENOUS | Status: DC
  Administered 2016-09-12: 11:00:00 3 mL via INTRAVENOUS

## 2016-09-12 MED ORDER — AMLODIPINE BESYLATE 5 MG PO TABS: 5.0000 mg | ORAL_TABLET | Freq: Every day | ORAL | Status: DC

## 2016-09-12 MED ORDER — LIDOCAINE HCL (PF) 1 % IJ SOLN
INTRAMUSCULAR | Status: AC
  Filled 2016-09-12: qty 30

## 2016-09-12 MED ORDER — FENTANYL CITRATE (PF) 100 MCG/2ML IJ SOLN
INTRAMUSCULAR | Status: AC
  Filled 2016-09-12: qty 2

## 2016-09-12 MED ORDER — HEPARIN SODIUM (PORCINE) 1000 UNIT/ML IJ SOLN
INTRAMUSCULAR | Status: AC
  Filled 2016-09-12: qty 1

## 2016-09-12 MED ORDER — HEPARIN (PORCINE) IN NACL 2-0.9 UNIT/ML-% IJ SOLN
INTRAMUSCULAR | Status: AC | PRN
  Administered 2016-09-12: 1000 mL

## 2016-09-12 MED ORDER — SODIUM CHLORIDE 0.9 % IV SOLN
250.0000 mL | INTRAVENOUS | Status: DC | PRN
  Administered 2016-09-12: 75 mL via INTRAVENOUS

## 2016-09-12 MED ORDER — MIDAZOLAM HCL 2 MG/2ML IJ SOLN
INTRAMUSCULAR | Status: DC | PRN
  Administered 2016-09-12 (×2): 1 mg via INTRAVENOUS

## 2016-09-12 MED ORDER — MIDAZOLAM HCL 2 MG/2ML IJ SOLN
INTRAMUSCULAR | Status: AC
  Filled 2016-09-12: qty 2

## 2016-09-12 MED ORDER — LIDOCAINE HCL (PF) 1 % IJ SOLN
INTRAMUSCULAR | Status: DC | PRN
  Administered 2016-09-12: 2 mL

## 2016-09-12 MED ORDER — IOPAMIDOL (ISOVUE-370) INJECTION 76%
INTRAVENOUS | Status: AC
  Filled 2016-09-12: qty 100

## 2016-09-12 MED ORDER — POTASSIUM CHLORIDE CRYS ER 20 MEQ PO TBCR
40.0000 meq | EXTENDED_RELEASE_TABLET | Freq: Once | ORAL | Status: AC
  Administered 2016-09-12: 09:00:00 40 meq via ORAL
  Filled 2016-09-12: qty 2

## 2016-09-12 MED ORDER — LISINOPRIL 10 MG PO TABS: 10.0000 mg | ORAL_TABLET | Freq: Every day | ORAL | 2 refills | Status: DC

## 2016-09-12 MED ORDER — FENTANYL CITRATE (PF) 100 MCG/2ML IJ SOLN
INTRAMUSCULAR | Status: DC | PRN
  Administered 2016-09-12 (×2): 25 ug via INTRAVENOUS

## 2016-09-12 MED ORDER — SODIUM CHLORIDE 0.9 % IV SOLN: INTRAVENOUS | Status: AC

## 2016-09-12 MED ORDER — AMLODIPINE BESYLATE 5 MG PO TABS: 5.0000 mg | ORAL_TABLET | Freq: Every day | ORAL | 2 refills | Status: DC

## 2016-09-12 MED ORDER — POTASSIUM CHLORIDE CRYS ER 20 MEQ PO TBCR
20.0000 meq | EXTENDED_RELEASE_TABLET | Freq: Once | ORAL | Status: AC
  Administered 2016-09-12: 12:00:00 20 meq via ORAL
  Filled 2016-09-12: qty 1

## 2016-09-12 MED ORDER — VERAPAMIL HCL 2.5 MG/ML IV SOLN
INTRAVENOUS | Status: DC | PRN
  Administered 2016-09-12: 10 mL via INTRA_ARTERIAL

## 2016-09-12 MED ORDER — VERAPAMIL HCL 2.5 MG/ML IV SOLN
INTRAVENOUS | Status: AC
  Filled 2016-09-12: qty 2

## 2016-09-12 MED ORDER — POTASSIUM CHLORIDE ER 20 MEQ PO TBCR: EXTENDED_RELEASE_TABLET | ORAL | 3 refills | Status: DC

## 2016-09-12 MED ORDER — IOPAMIDOL (ISOVUE-370) INJECTION 76%
INTRAVENOUS | Status: DC | PRN
  Administered 2016-09-12: 95 mL via INTRA_ARTERIAL

## 2016-09-12 MED ORDER — LISINOPRIL 10 MG PO TABS
10.0000 mg | ORAL_TABLET | Freq: Every day | ORAL | Status: DC
  Administered 2016-09-12: 14:00:00 10 mg via ORAL
  Filled 2016-09-12: qty 1

## 2016-09-12 SURGICAL SUPPLY — 10 items

## 2016-09-12 NOTE — Discharge Summary (Signed)
Discharge Summary    Patient ID: Bradley [name],  MRN: [id number], DOB/AGE: 1940/02/19 76 y.o.  Admit date: 09/11/2016 Discharge date: 09/12/2016  Primary Care Provider: Jani Gravel Primary Cardiologist: Dr. Bronson Ing  Discharge Diagnoses    Principal Problem:   Chest pain Active Problems:   CAD (coronary artery disease), native coronary artery   Precordial pain   Tobacco abuse   Emphysema, unspecified (HCC)   Chronic pain syndrome   Essential hypertension   Leukocytosis   Abnormal chest x-ray   Hyperlipidemia    Diagnostic Studies/Procedures    LHC 09/12/16 Conclusion    2nd Mrg lesion, 30 %stenosed.  The left ventricular systolic function is normal.  LV end diastolic pressure is normal.  The left ventricular ejection fraction is 45-50% by visual estimate.  There is no mitral valve regurgitation.   1. Mild non-obstructive CAD 2. Low normal LV systolic function with mild hypokinesis of the apical wall.  3. Normal filling pressures  Recommendations: Medical management of mild CAD with ASA and statin. Smoking cessation. He will need an echo before discharge to get a better assessment of his wall motion abnormality. This was ordered at [facility] but I cannot see that it was completed there.     _____________     History of Present Illness     Bradley [name] is a 76 y.o. male with history of CAD (first [place], second MI in 26), tobacco abuse since age 35, HTN, HLD who presented to [facility] upon transfer from [facility] for chest pain. He said his first myocardial infarction was in December 1994 and a second one was in February 1995. He denies undergoing coronary angiography at that time but did report cardiac catheterizations being performed in 1996 and 2002. No PCI was done on either occasion. These were done in [place], [place]. He is originally from [place] but left in the early 1960s and moved back one year ago. He presented to the hospital complaining of  progressive exertional chest pain, dyspnea, and progressive fatigue x 1 month. He was admitted for further evaluation. He was placed on IV heparin and IV NTG.  Hospital Course    Troponins remained negative. Lipase was normal. CXR showed mild bilateral interstitial prominence consistent with chronic interstitial lung disease. Labs were notable for mild leukocytosis which appeared consistent with prior abnormal values in February of this year. LDL was 54. Due to concern for unstable angina, he was transferred to [facility] in anticipation of heart cath which was performed today demonstrating: 1. Mild non-obstructive CAD 2. Low normal LV systolic function with mild hypokinesis of the apical wall, EF 45-50%. 3. Normal filling pressures  Etiology of his cardiomyopathy was not entirely clear, possibly related to HTN as BP were borderline uncontrolled in the hospital. He was on Imdur as outpatient for unclear reason. Given lack of significant coronary disease and that this is not an ideal BP med, Dr. Gwenlyn Found recommended to d/c this and start lisinopril 10mg  daily. He did not feel statin titration was needed at present time. He also recommended to switch diltiazem to amlodipine. The patient also had hypokalemia for unclear reasons down to 3.2, hence not started on a diuretic (was not on one prior to admission either). Potassium supplement was started. Have arranged BMET in [redacted] week along with outpatient echo. The patient feels well today. He was advised to discuss findings of abnormal CBC and abnormal CXR with PCP as these could be contributing to recent symptoms. Dr. Gwenlyn Found has seen  and examined the patient today and feels he is stable for discharge. He ambulated without difficulty. _____________  Discharge Vitals  Vital Signs. BP (!) 162/81   Pulse 63   Temp 97.6 F (36.4 C) (Oral)   Resp 14   Ht 6' (1.829 m)   Wt 160 lb (72.6 kg)   SpO2 99%   BMI 21.70 kg/m  General: Well developed, well  nourished WM, in no acute distress. Head: Normocephalic, atraumatic, sclera non-icteric, no xanthomas, nares are without discharge. Neck: Negative for carotid bruits. JVP not elevated. Lungs: Clear bilaterally to auscultation without wheezes, rales, or rhonchi. Breathing is unlabored. Heart: RRR S1 S2 without murmurs, rubs, or gallops.  Abdomen: Soft, non-tender, non-distended with normoactive bowel sounds. No rebound/guarding. Extremities: No clubbing or cyanosis. No edema. Distal pedal pulses are 2+ and equal bilaterally. Right radial cath site without hematoma or ecchymosis; good pulse. Neuro: Alert and oriented X 3. Moves all extremities spontaneously. Psych:  Responds to questions appropriately with a normal affect. Filed Weights   09/11/16 0939 09/12/16 0042  Weight: 155 lb (70.3 kg) 160 lb (72.6 kg)    Labs & Radiologic Studies    CBC  Recent Labs  09/11/16 1013 09/12/16 0412  WBC 10.9* 13.7*  NEUTROABS 8.2*  --   HGB 14.6 12.8*  HCT 43.6 39.2  MCV 87.7 88.3  PLT 458* 734*   Basic Metabolic Panel  Recent Labs  09/11/16 1013 09/12/16 0412  NA 139 140  K 3.4* 3.2*  CL 104 106  CO2 24 26  GLUCOSE 116* 98  BUN 20 20  CREATININE 0.96 0.96  CALCIUM 9.1 8.5*   Liver Function Tests  Recent Labs  09/11/16 1013  AST 17  ALT 14*  ALKPHOS 109  BILITOT 1.5*  PROT 7.6  ALBUMIN 3.4*    Recent Labs  09/11/16 1013  LIPASE 42   Cardiac Enzymes  Recent Labs  09/11/16 1013 09/11/16 1848 09/12/16 0037  TROPONINI <0.03 <0.03 <0.03   Hemoglobin A1C  Recent Labs  09/11/16 1848  HGBA1C 5.4   Fasting Lipid Panel  Recent Labs  09/12/16 0412  CHOL 107  HDL 36*  LDLCALC 54  TRIG 87  CHOLHDL 3.0  _____________  Dg Chest 2 View  Result Date: 09/11/2016 CLINICAL DATA:  Chest pain. EXAM: CHEST  2 VIEW COMPARISON:  No recent prior. FINDINGS: Mediastinum and hilar structures normal. Lungs are clear of acute infiltrates. Heart size normal. Mild bilateral  pulmonary interstitial prominence noted. This is unchanged and consistent with chronic interstitial lung disease. No acute bony abnormality. IMPRESSION: Mild bilateral from interstitial prominence noted consistent chronic interstitial lung disease. No acute infiltrate noted. Electronically Signed   By: Marcello Moores  Register   On: 09/11/2016 11:17   Disposition   Pt is being discharged home today in good condition.  Follow-up Plans & Appointments    Follow-up Information    [facility] Follow up.   Why:  For HEART ULTRASOUND and LABS -Come to the main entrance at [facility] on 09/19/16 - arrive at [place] will have heart ultrasound at 10:15am. Please let them know you also need bloodwork to have potassium rechecked. Contact information: [address] [phone]       Lendon Colonel, NP Follow up.   Specialties:  Nurse Practitioner, Radiology, Cardiology Why:  [facility] - see below. 09/25/16 at 3pm - arrive at 2:45pm to get checked in. Curt Bears is one of  the nurse practitioners with our [facility] team. Contact information: [place] [address] [phone]        Jani Gravel, MD Follow up.   Specialty:  Internal Medicine Why:  Please follow up with primary care provider to discuss your abnormal blood count and chest x-ray findings as this could be contributing to your recent shortness of breath. Contact information: [place] [address] [phone]          Discharge Instructions    Diet - low sodium heart healthy    Complete by:  As directed    Increase activity slowly    Complete by:  As directed    Your diltiazem and isosorbide were stopped. You were started on amlodipine and lisinopril for your blood pressure. You were started on potassium due to low potassium level.   No driving for 2 days. No lifting over 5 lbs for 1 week. No sexual activity for  1 week. Keep procedure site clean & dry. If you notice increased pain, swelling, bleeding or pus, call/return!  You may shower, but no soaking baths/hot tubs/pools for 1 week.   It is very important to quit smoking.      Discharge Medications   Allergies as of 09/12/2016   No Known Allergies     Medication List    STOP taking these medications   diltiazem 240 MG 24 hr capsule Commonly known as:  CARDIZEM CD   isosorbide mononitrate 20 MG tablet Commonly known as:  ISMO,MONOKET     TAKE these medications   ADVAIR HFA 115-21 MCG/ACT inhaler Generic drug:  fluticasone-salmeterol Inhale 2 puffs into the lungs every 12 (twelve) hours.   amLODipine 5 MG tablet Commonly known as:  NORVASC Take 1 tablet (5 mg total) by mouth daily.   aspirin EC 81 MG tablet Take 1 tablet by mouth daily.   celecoxib 100 MG capsule Commonly known as:  CELEBREX Take 1 capsule by mouth 2 (two) times daily.   gabapentin 300 MG capsule Commonly known as:  NEURONTIN Take 1 capsule by mouth 3 (three) times daily.   Iron 325 (65 Fe) MG Tabs Take 1 tablet by mouth daily.   lisinopril 10 MG tablet Commonly known as:  PRINIVIL,ZESTRIL Take 1 tablet (10 mg total) by mouth daily.   metoCLOPramide 5 MG tablet Commonly known as:  REGLAN Take 1 tablet by mouth 4 (four) times daily -  before meals and at bedtime.   metoprolol succinate 25 MG 24 hr tablet Commonly known as:  TOPROL-XL Take 1 tablet by mouth daily.   nitroGLYCERIN 0.4 MG SL tablet Commonly known as:  NITROSTAT Place 0.4 mg under the tongue every 5 (five) minutes as needed for chest pain.   omeprazole 20 MG capsule Commonly known as:  PRILOSEC Take 20 mg by mouth 2 (two) times daily.   Oxycodone HCl 10 MG Tabs Take 1 tablet by mouth 3 (three) times daily.   Potassium Chloride ER 20 MEQ Tbcr Take 1 tablet (20 mEq) by mouth daily.   simvastatin 10 MG tablet Commonly known as:  ZOCOR Take 10 mg by mouth daily.   VENTOLIN HFA  108 (90 Base) MCG/ACT inhaler Generic drug:  albuterol Inhale 2 puffs into the lungs every 4 (four) hours as needed for wheezing or shortness of breath.        Allergies:  No Known Allergies   Outstanding Labs/Studies   N/A  Duration of Discharge Encounter  Greater than 30 minutes including physician time.  Signed, Jhoana Upham N Jawanda Passey PA-C 09/12/2016, 1:10 PM

## 2016-09-12 NOTE — Progress Notes (Signed)
Pt up walking in halls with cane and without sob or cp.  Rt radial site is level 0.

## 2016-09-12 NOTE — Progress Notes (Signed)
R radial and R groin shaved and prepped. Consent obtained. Pt NPO since midnight. @ IV sites intact and patent. NSS infusing at 70.3cc's /hr. Heparin infusing at 1200 units/hr NTG at 60mcgs/hr.

## 2016-09-12 NOTE — Interval H&P Note (Signed)
History and Physical Interval Note:  09/12/2016 8:43 AM  Bradley [name]  has presented today for cardiac cath with the diagnosis of unstable angina. The various methods of treatment have been discussed with the patient and family. After consideration of risks, benefits and other options for treatment, the patient has consented to  Procedure(s): LEFT HEART CATH AND CORONARY ANGIOGRAPHY (N/A) as a surgical intervention .  The patient's history has been reviewed, patient examined, no change in status, stable for surgery.  I have reviewed the patient's chart and labs.  Questions were answered to the patient's satisfaction.    Cath Lab Visit (complete for each Cath Lab visit)  Clinical Evaluation Leading to the Procedure:   ACS: No.  Non-ACS:    Anginal Classification: CCS IV  Anti-ischemic medical therapy: Maximal Therapy (2 or more classes of medications)  Non-Invasive Test Results: No non-invasive testing performed  Prior CABG: No previous CABG         Bradley [name]

## 2016-09-12 NOTE — Progress Notes (Signed)
Pt's family member has arrived and pt is discharged without chest pain or sob and rt radial site is level 0. Pt nor family member has any new questions.

## 2016-09-12 NOTE — Progress Notes (Signed)
Pt given all discharge instructions and verbalized understanding of all.  Pt is aware of new meds and where to pick up prescriptions. Pt is not having any chest pain and rt radial is level 0. Pt has all belongings and will be discharged home with daughter via wc.  Awaiting daughter's arrival.

## 2016-09-12 NOTE — Progress Notes (Signed)
Spoke to care management who is aware of potential code 72, they are working to adjust status to observation which is appropriate.

## 2016-09-12 NOTE — Progress Notes (Signed)
Right radial tr band deflated as per protocol and tr band removed at 1315.  Clean dry drsg applied. Site upon arrival to unit was level 0 and remains a level 0 now. CSM wnl. 2+ palpable rt radial and rt ulnar.  Post tr band instructions given to pt with verbalization of understanding. No hematoma or bleeding noted.

## 2016-09-12 NOTE — Care Management Obs Status (Signed)
[facility] NOTIFICATION   Patient Details  Name: Bradley [name] MRN: [id number] Date of Birth: [birth year]   Medicare Observation Status Notification Given:  Yes    Bethena Roys, RN 09/12/2016, 12:57 PM

## 2016-09-12 NOTE — H&P (View-Only) (Signed)
Cardiology Consultation:   Patient ID: Bradley [name]; [id number]; 04/20/1940   Admit date: 09/11/2016 Date of Consult: 09/11/2016  Primary Care Provider: Jani Gravel, MD Primary Cardiologist: Bronson Ing (new)   Patient Profile:   Bradley [name] is a 76 y.o. male with a hx of CAD and MI who is being seen today for the evaluation of chest pain at the request of Dr. Carles Collet.  History of Present Illness:   Bradley [name] is a 76 year old male with a history of coronary artery disease and MI. He said his first myocardial infarction was in December 1994 and a second one was in February 1995. He denies undergoing coronary angiography at that time but does report cardiac catheterizations being performed in 1996 and 2002. No PCI was done on either occasion. These were done in [place], [place]. He is originally from [place] but left in the early 1960s and moved back one year ago.  He has been experiencing progressive exertional chest pain which he describes as a "bandlike vice sensation"accompanied by exertional dyspnea and alleviated with rest. He has felt progressively fatigued over the past one month. About 3 days ago he had significant worsening of chest pain to the point where he was lying in bed. His daughter encouraged him to be evaluated in the ED.  He is now on IV heparin. His chest pain had been rated at 9/10 and is now down to 1/10 with 20 mcg/min of IV nitroglycerin.  He has smoked since the age of 79. He used to smoke 4 packs of cigarettes daily and is now down to a half pack of cigarettes daily.  Initial troponin is normal. He is mildly hypokalemic, potassium 3.4. He has a leukocytosis, WBC count 10.9. INR is normal.  Lipid panel and HbA1c have been ordered.  Chest x-ray shows mild bilateral interstitial prominence consistent with chronic interstitial lung disease. There were no acute infiltrates and no evidence of pulmonary edema.  ECG which I personally interpreted  demonstrated sinus rhythm with no acute ischemic changes nor any arrhythmias.     Past Medical History:  Diagnosis Date  . Coronary artery disease   . High cholesterol   . Hypertension   . Myocardial infarction [facility])     Past Surgical History:  Procedure Laterality Date  . APPENDECTOMY    . NASAL ENDOSCOPY WITH EPISTAXIS CONTROL Right 03/15/2016   Procedure: NASAL ENDOSCOPY WITH EPISTAXIS CONTROL;  Surgeon: Jodi Marble, MD;  Location: [facility];  Service: ENT;  Laterality: Right;  . SEPTOPLASTY Right 03/15/2016   Procedure: SEPTOPLASTY;  Surgeon: Jodi Marble, MD;  Location: [facility];  Service: ENT;  Laterality: Right;  . SINUS EXPLORATION Right 03/15/2016   Procedure: SINUS EXPLORATION;  Surgeon: Jodi Marble, MD;  Location: [facility];  Service: ENT;  Laterality: Right;  . TONSILLECTOMY    . TOOTH EXTRACTION       Inpatient Medications: Scheduled Meds:  Continuous Infusions: . heparin 850 Units/hr (09/11/16 1151)   PRN Meds:   Allergies:   No Known Allergies  Social History:   Social History   Social History  . Marital status: Widowed    Spouse name: N/A  . Number of children: N/A  . Years of education: N/A   Occupational History  . Not on file.   Social History Main Topics  . Smoking status: Current Every Day Smoker  . Smokeless tobacco: Never Used  . Alcohol use No  . Drug use: No  . Sexual activity: Not on file   Other  Topics Concern  . Not on file   Social History Narrative  . No narrative on file    Family History:   No premature CAD.  ROS:  Please see the history of present illness.  ROS  All other ROS reviewed and negative.     Physical Exam/Data:   Vitals:   09/11/16 1100 09/11/16 1115 09/11/16 1130 09/11/16 1230  BP: 130/88  (!) 151/98 (!) 159/86  Pulse: 71 71 70 69  Resp: (!) 21 20 18 20   Temp:      SpO2: 96% 96% 95% 96%  Weight:      Height:       No intake or output data in the 24 hours ending 09/11/16 1257 Filed Weights    09/11/16 0939  Weight: 155 lb (70.3 kg)   Body mass index is 21.02 kg/m.  General:  Well nourished, well developed, in no acute distress HEENT: normal Lymph: no adenopathy Neck: no JVD Endocrine:  No thryomegaly Vascular: No carotid bruits Cardiac:  normal S1, S2; RRR; no murmur  Lungs:  Diminished throughout, no crackles or wheezes.  Abd: soft, nontender, no hepatomegaly  Ext: no edema Musculoskeletal:  No deformities, BUE and BLE strength normal and equal Skin: warm and dry  Neuro:  CNs 2-12 intact, no focal abnormalities noted Psych:  Normal affect    Relevant CV Studies: None  Laboratory Data:  Chemistry Recent Labs Lab 09/11/16 1013  NA 139  K 3.4*  CL 104  CO2 24  GLUCOSE 116*  BUN 20  CREATININE 0.96  CALCIUM 9.1  GFRNONAA >60  GFRAA >60  ANIONGAP 11     Recent Labs Lab 09/11/16 1013  PROT 7.6  ALBUMIN 3.4*  AST 17  ALT 14*  ALKPHOS 109  BILITOT 1.5*   Hematology Recent Labs Lab 09/11/16 1013  WBC 10.9*  RBC 4.97  HGB 14.6  HCT 43.6  MCV 87.7  MCH 29.4  MCHC 33.5  RDW 17.2*  PLT 458*   Cardiac Enzymes Recent Labs Lab 09/11/16 1013  TROPONINI <0.03   No results for input(s): TROPIPOC in the last 168 hours.  BNPNo results for input(s): BNP, PROBNP in the last 168 hours.  DDimer No results for input(s): DDIMER in the last 168 hours.  Radiology/Studies:  Dg Chest 2 View  Result Date: 09/11/2016 CLINICAL DATA:  Chest pain. EXAM: CHEST  2 VIEW COMPARISON:  No recent prior. FINDINGS: Mediastinum and hilar structures normal. Lungs are clear of acute infiltrates. Heart size normal. Mild bilateral pulmonary interstitial prominence noted. This is unchanged and consistent with chronic interstitial lung disease. No acute bony abnormality. IMPRESSION: Mild bilateral from interstitial prominence noted consistent chronic interstitial lung disease. No acute infiltrate noted. Electronically Signed   By: [place]   On: 09/11/2016 11:17     Assessment and Plan:   1. Unstable angina: Given his history of prior MIs and long-standing tobacco abuse, and the need for 20 mcg/m of IV nitroglycerin to keep his chest pain under control, I think the best course of action is coronary angiography. He is currently on aspirin, beta blocker, statin, and IV heparin. Risks and benefits of cardiac catheterization have been discussed with the patient.  These include bleeding, infection, kidney damage, stroke, heart attack, death.  The patient understands these risks and is willing to proceed. We will arrange for transfer to [facility].  2. Hypertension: Blood pressure is currently elevated. He may require additional antihypertensive titration.  3. Hyperlipidemia: Currently on low-dose  statin. Would recommend switching to high intensity statin therapy.  4. Tobacco abuse disorder.   Signed, Kate Sable, MD  09/11/2016 12:57 PM

## 2016-09-12 NOTE — Progress Notes (Signed)
ANTICOAGULATION CONSULT NOTE - Follow Up Consult  Pharmacy Consult for Heparin  Indication: chest pain/ACS  No Known Allergies  Patient Measurements: Height: 6' (182.9 cm) Weight: 160 lb (72.6 kg) IBW/kg (Calculated) : 77.6  Vital Signs: Temp: 97.7 F (36.5 C) (08/17 0042) Temp Source: Oral (08/17 0042) BP: 132/70 (08/17 0042) Pulse Rate: 65 (08/17 0042)  Labs:  Recent Labs  09/11/16 1013 09/11/16 1017 09/11/16 1743 09/11/16 1848 09/12/16 0037 09/12/16 0412  HGB 14.6  --   --   --   --  12.8*  HCT 43.6  --   --   --   --  39.2  PLT 458*  --   --   --   --  449*  LABPROT  --  12.9  --   --   --  13.7  INR  --  0.97  --   --   --  1.05  HEPARINUNFRC  --   --  0.25*  --   --  0.20*  CREATININE 0.96  --   --   --   --  0.96  TROPONINI <0.03  --   --  <0.03 <0.03  --     Estimated Creatinine Clearance: 68.3 mL/min (by C-G formula based on SCr of 0.96 mg/dL).   Assessment: Heparin for CP, plans for cath, heparin level low despite rate increase, no issues per RN.   Goal of Therapy:  Heparin level 0.3-0.7 units/ml Monitor platelets by anticoagulation protocol: Yes   Plan:  -Inc heparin to 1200 units/hr -1400 HL  Bradley [name] 09/12/2016,6:10 AM

## 2016-09-12 NOTE — Progress Notes (Signed)
K 3.2 today. Was 3.4 yesterday. Not on any diuretics or K supp. Will give 36meq now, 60meq in 4 hours then start 57meq daily.

## 2016-09-12 NOTE — Care Management CC44 (Signed)
Condition Code 44 Documentation Completed  Patient Details  Name: Bradley [name] MRN: [id number] Date of Birth: [birth year]   Condition Code 44 given:  Yes Patient signature on Condition Code 44 notice:  Yes Documentation of 2 MD's agreement:  Yes Code 44 added to claim:  Yes    Bethena Roys, RN 09/12/2016, 12:57 PM

## 2016-09-19 ENCOUNTER — Ambulatory Visit (HOSPITAL_COMMUNITY): Payer: Medicare HMO | Attending: Physician Assistant

## 2016-09-25 ENCOUNTER — Encounter: Payer: Self-pay | Admitting: Adult Health

## 2016-09-25 ENCOUNTER — Ambulatory Visit (INDEPENDENT_AMBULATORY_CARE_PROVIDER_SITE_OTHER): Payer: Medicare HMO | Admitting: Adult Health

## 2016-09-25 VITALS — BP 108/58 | HR 60 | Ht 72.0 in | Wt 152.0 lb

## 2016-09-25 DIAGNOSIS — Z720 Tobacco use: Secondary | ICD-10-CM

## 2016-09-25 DIAGNOSIS — I10 Essential (primary) hypertension: Secondary | ICD-10-CM | POA: Diagnosis not present

## 2016-09-25 DIAGNOSIS — I2510 Atherosclerotic heart disease of native coronary artery without angina pectoris: Secondary | ICD-10-CM | POA: Diagnosis not present

## 2016-09-25 NOTE — Patient Instructions (Addendum)
Medication Instructions:  Your physician recommends that you continue on your current medications as directed. Please refer to the Current Medication list given to you today. Stop taking Diltiazem   Labwork: NONE  Testing/Procedures: NONE  Follow-Up: Your physician recommends that you schedule a follow-up appointment in: 3 Month with Dr. Bronson Ing   Any Other Special Instructions Will Be Listed Below (If Applicable).  Your physician has requested that you regularly monitor and record your blood pressure readings at home. Please use the same machine at the same time of day to check your readings and record them for 2 weeks and drop them off at the office.   You have been given a Rx for a BP cuff.    If you need a refill on your cardiac medications before your next appointment, please call your pharmacy.  Thank you for choosing [facility]!

## 2016-09-25 NOTE — Progress Notes (Signed)
Cardiology Office Note   Date:  09/25/2016   ID:  Bradley [name], DOB 12/20/1940, MRN [id number]  PCP:  Jani Gravel, MD  Cardiologist:  Bronson Ing Chief Complaint  Patient presents with  . Chest Pain  . Hypertension      History of Present Illness: Bradley [name] is a 76 y.o. male who presents for ongoing assessment and management post hospitalization after admission for chest pain requiring cardiac catheterization. The patient had mild nonobstructive CAD with low normal LV systolic function and mild hypokinesis of a apical wall. EF was 45% to 50% by visual estimate. The patient was to manage medically.  Other history includes CAD (apparently had first MI in 20 and a second MI in 1995). These events occurred in [facility]. During hospitalization his reduced ejection fraction/cardiomyopathy was not found to be entirely clear but possibly related to hypertension. The patient was placed on antihypertensive medications, with central 10 mg daily, and due to decreased EF he was switched from diltiazem to amlodipine. He was also started on potassium supplement. He was to have an outpatient echo. Isosorbide was discontinued.  He comes today feeling weak and tired. After reviewing his medications, the patient did not stop taking diltiazem as directed, and is also been taking amlodipine. The patient blood pressure is low at 108/58. He unfortunately continues to smoke. He rolls his own cigarettes from high tobacco. He has chronic tach pain.  Past Medical History:  Diagnosis Date  . Chronic lower back pain   . Contact with stonefish as cause of accidental injury 1954   "stung across my left hand in [place]; LUE weaker since"  . Coronary artery disease   . High cholesterol   . History of blood transfusion 1943  . Hypertension   . Migraine    "none since the 1990s" (09/11/2016)  . Myocardial infarction ([facility]) 01/20/1993; 02/28/1993  . Pneumonia    "several times" (09/11/2016)    Past  Surgical History:  Procedure Laterality Date  . APPENDECTOMY    . CARDIAC CATHETERIZATION    . CATARACT EXTRACTION, BILATERAL Bilateral   . JOINT REPLACEMENT    . LEFT HEART CATH AND CORONARY ANGIOGRAPHY N/A 09/12/2016   Procedure: LEFT HEART CATH AND CORONARY ANGIOGRAPHY;  Surgeon: Burnell Blanks, MD;  Location: [facility] CV LAB;  Service: Cardiovascular;  Laterality: N/A;  . NASAL ENDOSCOPY WITH EPISTAXIS CONTROL Right 03/15/2016   Procedure: NASAL ENDOSCOPY WITH EPISTAXIS CONTROL;  Surgeon: Jodi Marble, MD;  Location: [facility];  Service: ENT;  Laterality: Right;  . SEPTOPLASTY Right 03/15/2016   Procedure: SEPTOPLASTY;  Surgeon: Jodi Marble, MD;  Location: [facility];  Service: ENT;  Laterality: Right;  . SINUS EXPLORATION Right 03/15/2016   Procedure: SINUS EXPLORATION;  Surgeon: Jodi Marble, MD;  Location: [facility];  Service: ENT;  Laterality: Right;  . TONSILLECTOMY    . TOOTH EXTRACTION    . TOTAL KNEE ARTHROPLASTY Left      Current Outpatient Prescriptions  Medication Sig Dispense Refill  . ADVAIR HFA 115-21 MCG/ACT inhaler Inhale 2 puffs into the lungs every 12 (twelve) hours.  4  . amLODipine (NORVASC) 5 MG tablet Take 1 tablet (5 mg total) by mouth daily. 30 tablet 2  . aspirin EC 81 MG tablet Take 1 tablet by mouth daily.    . celecoxib (CELEBREX) 100 MG capsule Take 1 capsule by mouth 2 (two) times daily.    . Ferrous Sulfate (IRON) 325 (65 Fe) MG TABS Take 1 tablet by mouth daily.    [name]  gabapentin (NEURONTIN) 300 MG capsule Take 1 capsule by mouth 3 (three) times daily.  2  . lisinopril (PRINIVIL,ZESTRIL) 10 MG tablet Take 1 tablet (10 mg total) by mouth daily. 30 tablet 2  . metoCLOPramide (REGLAN) 5 MG tablet Take 1 tablet by mouth 4 (four) times daily -  before meals and at bedtime.  5  . metoprolol succinate (TOPROL-XL) 25 MG 24 hr tablet Take 1 tablet by mouth daily.    . nitroGLYCERIN (NITROSTAT) 0.4 MG SL tablet Place 0.4 mg under the tongue every 5 (five) minutes  as needed for chest pain.    [name] omeprazole (PRILOSEC) 20 MG capsule Take 20 mg by mouth 2 (two) times daily.    . Oxycodone HCl 10 MG TABS Take 1 tablet by mouth 3 (three) times daily.    . potassium chloride 20 MEQ TBCR Take 1 tablet (20 mEq) by mouth daily. 30 tablet 3  . simvastatin (ZOCOR) 10 MG tablet Take 10 mg by mouth daily.  3  . VENTOLIN HFA 108 (90 Base) MCG/ACT inhaler Inhale 2 puffs into the lungs every 4 (four) hours as needed for wheezing or shortness of breath.   3   No current facility-administered medications for this visit.     Allergies:   Patient has no known allergies.    Social History:  The patient  reports that he has been smoking Cigarettes.  He has a 68.00 pack-year smoking history. He has never used smokeless tobacco. He reports that he does not drink alcohol or use drugs.   Family History:  The patient's family history is not on file.    ROS: All other systems are reviewed and negative. Unless otherwise mentioned in H&P    PHYSICAL EXAM: VS:  BP (!) 108/58   Pulse 60   Ht 6' (1.829 m)   Wt 152 lb (68.9 kg)   SpO2 95%   BMI 20.61 kg/m  , BMI Body mass index is 20.61 kg/m. GEN: Well nourished, well developed, in no acute distress  HEENT: normal  Neck: no JVD, carotid bruits, or masses Cardiac: RRR; no murmurs, rubs, or gallops,no edema  Respiratory: Inspiratory crackles, not wheezes or coughing.  GI:  soft, nontender, nondistended, + BS MS: no deformity or atrophy  Skin: warm and dry, no rash Neuro:  Strength and sensation are intact Psych: euthymic mood, full affect  Recent Labs: 09/11/2016: ALT 14 09/12/2016: BUN 20; Creatinine, Ser 0.96; Hemoglobin 12.8; Platelets 449; Potassium 3.2; Sodium 140    Lipid Panel    Component Value Date/Time   CHOL 107 09/12/2016 0412   TRIG 87 09/12/2016 0412   HDL 36 (L) 09/12/2016 0412   CHOLHDL 3.0 09/12/2016 0412   VLDL 17 09/12/2016 0412   LDLCALC 54 09/12/2016 0412      Wt Readings from Last 3  Encounters:  09/25/16 152 lb (68.9 kg)  09/12/16 160 lb (72.6 kg)  03/15/16 168 lb (76.2 kg)    ASSESSMENT AND PLAN:  1. Coronary artery disease: Status post cardiac catheterization revealing minimal coronary artery disease 30% stenosis in the second marginal. LVEF of 45-50% by visual estimate. The patient will continue secondary prevention with statin therapy, beta blocker, and ACE inhibitor. He will continue aspirin therapy.  2. Hypertension: The patient was taken off the diltiazem and begun on amlodipine 5 mg daily. Unfortunately he continued to take the diltiazem along with the amlodipine. His blood pressure is soft today and he is feeling fatigued. I've written down his medication "  diltiazem" to remind him which medication to put away. He is advised to get a blood pressure cuff and record his blood pressure daily for the next couple of weeks. He is advised to use an arm cuff and stent of a wrist cuff for better accuracy.  3. Ongoing tobacco abuse: The patient smokes his own rolled cigarettes. He is found to have some lung disease per chest x-ray and remains on Advair Diskus.   Current medicines are reviewed at length with the patient today.    Labs/ tests ordered today include:  Phill Myron. West Pugh, ANP, AACC   09/25/2016 3:34 PM    [facility] [address], [place], [place] [address] Phone: [phone]; Fax: [phone]

## 2016-10-07 DIAGNOSIS — I2510 Atherosclerotic heart disease of native coronary artery without angina pectoris: Secondary | ICD-10-CM | POA: Diagnosis not present

## 2016-10-07 DIAGNOSIS — M545 Low back pain: Secondary | ICD-10-CM | POA: Diagnosis not present

## 2016-10-07 DIAGNOSIS — Z79899 Other long term (current) drug therapy: Secondary | ICD-10-CM | POA: Diagnosis not present

## 2016-10-07 DIAGNOSIS — J449 Chronic obstructive pulmonary disease, unspecified: Secondary | ICD-10-CM | POA: Diagnosis not present

## 2016-10-07 DIAGNOSIS — R69 Illness, unspecified: Secondary | ICD-10-CM | POA: Diagnosis not present

## 2016-11-04 DIAGNOSIS — E785 Hyperlipidemia, unspecified: Secondary | ICD-10-CM | POA: Diagnosis not present

## 2016-11-04 DIAGNOSIS — R69 Illness, unspecified: Secondary | ICD-10-CM | POA: Diagnosis not present

## 2016-11-04 DIAGNOSIS — I10 Essential (primary) hypertension: Secondary | ICD-10-CM | POA: Diagnosis not present

## 2016-11-04 DIAGNOSIS — Z79899 Other long term (current) drug therapy: Secondary | ICD-10-CM | POA: Diagnosis not present

## 2016-11-04 DIAGNOSIS — M545 Low back pain: Secondary | ICD-10-CM | POA: Diagnosis not present

## 2016-11-14 DIAGNOSIS — R69 Illness, unspecified: Secondary | ICD-10-CM | POA: Diagnosis not present

## 2016-12-02 DIAGNOSIS — M545 Low back pain: Secondary | ICD-10-CM | POA: Diagnosis not present

## 2016-12-02 DIAGNOSIS — R69 Illness, unspecified: Secondary | ICD-10-CM | POA: Diagnosis not present

## 2016-12-02 DIAGNOSIS — J449 Chronic obstructive pulmonary disease, unspecified: Secondary | ICD-10-CM | POA: Diagnosis not present

## 2016-12-02 DIAGNOSIS — Z79899 Other long term (current) drug therapy: Secondary | ICD-10-CM | POA: Diagnosis not present

## 2016-12-02 DIAGNOSIS — I2510 Atherosclerotic heart disease of native coronary artery without angina pectoris: Secondary | ICD-10-CM | POA: Diagnosis not present

## 2016-12-19 ENCOUNTER — Emergency Department (HOSPITAL_COMMUNITY): Payer: Medicare HMO

## 2016-12-19 ENCOUNTER — Encounter (HOSPITAL_COMMUNITY): Payer: Self-pay | Admitting: Emergency Medicine

## 2016-12-19 ENCOUNTER — Other Ambulatory Visit: Payer: Self-pay

## 2016-12-19 ENCOUNTER — Inpatient Hospital Stay (HOSPITAL_COMMUNITY)
Admission: EM | Admit: 2016-12-19 | Discharge: 2016-12-23 | DRG: 470 | Disposition: A | Discharge status: Home / Self Care | Payer: Medicare HMO | Attending: Internal Medicine | Admitting: Internal Medicine

## 2016-12-19 DIAGNOSIS — I252 Old myocardial infarction: Secondary | ICD-10-CM

## 2016-12-19 DIAGNOSIS — I110 Hypertensive heart disease with heart failure: Secondary | ICD-10-CM | POA: Diagnosis not present

## 2016-12-19 DIAGNOSIS — W010XXA Fall on same level from slipping, tripping and stumbling without subsequent striking against object, initial encounter: Secondary | ICD-10-CM | POA: Diagnosis not present

## 2016-12-19 DIAGNOSIS — Z8701 Personal history of pneumonia (recurrent): Secondary | ICD-10-CM

## 2016-12-19 DIAGNOSIS — J438 Other emphysema: Secondary | ICD-10-CM | POA: Diagnosis not present

## 2016-12-19 DIAGNOSIS — R0600 Dyspnea, unspecified: Secondary | ICD-10-CM

## 2016-12-19 DIAGNOSIS — J439 Emphysema, unspecified: Secondary | ICD-10-CM | POA: Diagnosis not present

## 2016-12-19 DIAGNOSIS — I700 Atherosclerosis of aorta: Secondary | ICD-10-CM | POA: Diagnosis present

## 2016-12-19 DIAGNOSIS — Z9841 Cataract extraction status, right eye: Secondary | ICD-10-CM | POA: Diagnosis not present

## 2016-12-19 DIAGNOSIS — I878 Other specified disorders of veins: Secondary | ICD-10-CM | POA: Diagnosis present

## 2016-12-19 DIAGNOSIS — F17200 Nicotine dependence, unspecified, uncomplicated: Secondary | ICD-10-CM | POA: Diagnosis present

## 2016-12-19 DIAGNOSIS — Z8249 Family history of ischemic heart disease and other diseases of the circulatory system: Secondary | ICD-10-CM | POA: Diagnosis not present

## 2016-12-19 DIAGNOSIS — S72001A Fracture of unspecified part of neck of right femur, initial encounter for closed fracture: Secondary | ICD-10-CM

## 2016-12-19 DIAGNOSIS — Z79891 Long term (current) use of opiate analgesic: Secondary | ICD-10-CM

## 2016-12-19 DIAGNOSIS — R0602 Shortness of breath: Secondary | ICD-10-CM | POA: Diagnosis not present

## 2016-12-19 DIAGNOSIS — I25119 Atherosclerotic heart disease of native coronary artery with unspecified angina pectoris: Secondary | ICD-10-CM | POA: Diagnosis not present

## 2016-12-19 DIAGNOSIS — I5022 Chronic systolic (congestive) heart failure: Secondary | ICD-10-CM | POA: Diagnosis present

## 2016-12-19 DIAGNOSIS — I2510 Atherosclerotic heart disease of native coronary artery without angina pectoris: Secondary | ICD-10-CM | POA: Diagnosis present

## 2016-12-19 DIAGNOSIS — Z9089 Acquired absence of other organs: Secondary | ICD-10-CM | POA: Diagnosis not present

## 2016-12-19 DIAGNOSIS — J449 Chronic obstructive pulmonary disease, unspecified: Secondary | ICD-10-CM | POA: Diagnosis present

## 2016-12-19 DIAGNOSIS — I10 Essential (primary) hypertension: Secondary | ICD-10-CM

## 2016-12-19 DIAGNOSIS — I959 Hypotension, unspecified: Secondary | ICD-10-CM | POA: Diagnosis not present

## 2016-12-19 DIAGNOSIS — Z471 Aftercare following joint replacement surgery: Secondary | ICD-10-CM | POA: Diagnosis not present

## 2016-12-19 DIAGNOSIS — Z96641 Presence of right artificial hip joint: Secondary | ICD-10-CM | POA: Diagnosis not present

## 2016-12-19 DIAGNOSIS — Z9842 Cataract extraction status, left eye: Secondary | ICD-10-CM | POA: Diagnosis not present

## 2016-12-19 DIAGNOSIS — D72829 Elevated white blood cell count, unspecified: Secondary | ICD-10-CM | POA: Diagnosis not present

## 2016-12-19 DIAGNOSIS — G8929 Other chronic pain: Secondary | ICD-10-CM | POA: Diagnosis present

## 2016-12-19 DIAGNOSIS — Z7982 Long term (current) use of aspirin: Secondary | ICD-10-CM | POA: Diagnosis not present

## 2016-12-19 DIAGNOSIS — M545 Low back pain: Secondary | ICD-10-CM | POA: Diagnosis not present

## 2016-12-19 DIAGNOSIS — S72009A Fracture of unspecified part of neck of unspecified femur, initial encounter for closed fracture: Secondary | ICD-10-CM | POA: Diagnosis present

## 2016-12-19 DIAGNOSIS — E785 Hyperlipidemia, unspecified: Secondary | ICD-10-CM | POA: Diagnosis present

## 2016-12-19 DIAGNOSIS — T148XXA Other injury of unspecified body region, initial encounter: Secondary | ICD-10-CM | POA: Diagnosis not present

## 2016-12-19 DIAGNOSIS — G894 Chronic pain syndrome: Secondary | ICD-10-CM | POA: Diagnosis present

## 2016-12-19 DIAGNOSIS — J8410 Pulmonary fibrosis, unspecified: Secondary | ICD-10-CM | POA: Diagnosis present

## 2016-12-19 DIAGNOSIS — Z09 Encounter for follow-up examination after completed treatment for conditions other than malignant neoplasm: Secondary | ICD-10-CM

## 2016-12-19 DIAGNOSIS — Z720 Tobacco use: Secondary | ICD-10-CM | POA: Diagnosis present

## 2016-12-19 DIAGNOSIS — S72041A Displaced fracture of base of neck of right femur, initial encounter for closed fracture: Principal | ICD-10-CM | POA: Diagnosis present

## 2016-12-19 DIAGNOSIS — Y92019 Unspecified place in single-family (private) house as the place of occurrence of the external cause: Secondary | ICD-10-CM | POA: Diagnosis not present

## 2016-12-19 DIAGNOSIS — Y9389 Activity, other specified: Secondary | ICD-10-CM

## 2016-12-19 DIAGNOSIS — R55 Syncope and collapse: Secondary | ICD-10-CM | POA: Diagnosis not present

## 2016-12-19 DIAGNOSIS — Z96652 Presence of left artificial knee joint: Secondary | ICD-10-CM | POA: Diagnosis present

## 2016-12-19 DIAGNOSIS — Z79899 Other long term (current) drug therapy: Secondary | ICD-10-CM | POA: Diagnosis not present

## 2016-12-19 DIAGNOSIS — M25551 Pain in right hip: Secondary | ICD-10-CM | POA: Diagnosis not present

## 2016-12-19 DIAGNOSIS — S299XXA Unspecified injury of thorax, initial encounter: Secondary | ICD-10-CM | POA: Diagnosis not present

## 2016-12-19 LAB — PROTIME-INR
INR: 0.95
Prothrombin Time: 12.6 seconds (ref 11.4–15.2)

## 2016-12-19 LAB — CBC WITH DIFFERENTIAL/PLATELET
BASOS ABS: 0 10*3/uL (ref 0.0–0.1)
Basophils Relative: 0 %
Eosinophils Absolute: 0.1 10*3/uL (ref 0.0–0.7)
Eosinophils Relative: 0 %
HEMATOCRIT: 50.1 % (ref 39.0–52.0)
Hemoglobin: 16.3 g/dL (ref 13.0–17.0)
LYMPHS PCT: 21 %
Lymphs Abs: 3.4 10*3/uL (ref 0.7–4.0)
MCH: 30.4 pg (ref 26.0–34.0)
MCHC: 32.5 g/dL (ref 30.0–36.0)
MCV: 93.3 fL (ref 78.0–100.0)
Monocytes Absolute: 1 10*3/uL (ref 0.1–1.0)
Monocytes Relative: 6 %
Neutro Abs: 11.5 10*3/uL — ABNORMAL HIGH (ref 1.7–7.7)
Neutrophils Relative %: 73 %
Platelets: 426 10*3/uL — ABNORMAL HIGH (ref 150–400)
RBC: 5.37 MIL/uL (ref 4.22–5.81)
RDW: 15.6 % — ABNORMAL HIGH (ref 11.5–15.5)
WBC: 16 10*3/uL — AB (ref 4.0–10.5)

## 2016-12-19 LAB — BASIC METABOLIC PANEL
Anion gap: 13 (ref 5–15)
BUN: 15 mg/dL (ref 6–20)
CALCIUM: 9.6 mg/dL (ref 8.9–10.3)
CO2: 23 mmol/L (ref 22–32)
CREATININE: 0.87 mg/dL (ref 0.61–1.24)
Chloride: 97 mmol/L — ABNORMAL LOW (ref 101–111)
Glucose, Bld: 107 mg/dL — ABNORMAL HIGH (ref 65–99)
Potassium: 4 mmol/L (ref 3.5–5.1)
SODIUM: 133 mmol/L — AB (ref 135–145)

## 2016-12-19 LAB — URINALYSIS, ROUTINE W REFLEX MICROSCOPIC
Bilirubin Urine: NEGATIVE
Glucose, UA: NEGATIVE mg/dL
Hgb urine dipstick: NEGATIVE
Ketones, ur: 5 mg/dL — AB
Leukocytes, UA: NEGATIVE
NITRITE: NEGATIVE
Protein, ur: NEGATIVE mg/dL
SPECIFIC GRAVITY, URINE: 1.006 (ref 1.005–1.030)
pH: 7 (ref 5.0–8.0)

## 2016-12-19 LAB — TROPONIN I

## 2016-12-19 MED ORDER — ACETAMINOPHEN 650 MG RE SUPP: 650.0000 mg | Freq: Four times a day (QID) | RECTAL | Status: DC | PRN

## 2016-12-19 MED ORDER — AMLODIPINE BESYLATE 10 MG PO TABS
10.0000 mg | ORAL_TABLET | Freq: Every day | ORAL | Status: DC
  Filled 2016-12-19 (×2): qty 1

## 2016-12-19 MED ORDER — IPRATROPIUM-ALBUTEROL 0.5-2.5 (3) MG/3ML IN SOLN
3.0000 mL | Freq: Four times a day (QID) | RESPIRATORY_TRACT | Status: DC
  Administered 2016-12-19 (×2): 3 mL via RESPIRATORY_TRACT
  Filled 2016-12-19 (×3): qty 3

## 2016-12-19 MED ORDER — GABAPENTIN 300 MG PO CAPS
300.0000 mg | ORAL_CAPSULE | Freq: Three times a day (TID) | ORAL | Status: DC
  Administered 2016-12-19 – 2016-12-23 (×9): 300 mg via ORAL
  Filled 2016-12-19 (×10): qty 1

## 2016-12-19 MED ORDER — ONDANSETRON HCL 4 MG/2ML IJ SOLN: 4.0000 mg | Freq: Four times a day (QID) | INTRAMUSCULAR | Status: DC | PRN

## 2016-12-19 MED ORDER — ALBUTEROL SULFATE (2.5 MG/3ML) 0.083% IN NEBU: 2.5000 mg | INHALATION_SOLUTION | RESPIRATORY_TRACT | Status: DC | PRN

## 2016-12-19 MED ORDER — ASPIRIN EC 81 MG PO TBEC
81.0000 mg | DELAYED_RELEASE_TABLET | Freq: Every day | ORAL | Status: DC
  Administered 2016-12-21 – 2016-12-23 (×3): 81 mg via ORAL
  Filled 2016-12-19 (×3): qty 1

## 2016-12-19 MED ORDER — OXYCODONE HCL 10 MG PO TABS: 10.0000 mg | ORAL_TABLET | Freq: Four times a day (QID) | ORAL | Status: DC | PRN

## 2016-12-19 MED ORDER — HYDROMORPHONE HCL 1 MG/ML IJ SOLN
0.5000 mg | INTRAMUSCULAR | Status: AC | PRN
  Administered 2016-12-19 (×2): 0.5 mg via INTRAVENOUS
  Filled 2016-12-19 (×2): qty 1

## 2016-12-19 MED ORDER — FERROUS SULFATE 325 (65 FE) MG PO TABS
325.0000 mg | ORAL_TABLET | Freq: Every day | ORAL | Status: DC
  Administered 2016-12-21 – 2016-12-23 (×3): 325 mg via ORAL
  Filled 2016-12-19 (×3): qty 1

## 2016-12-19 MED ORDER — ALBUTEROL SULFATE HFA 108 (90 BASE) MCG/ACT IN AERS: 2.0000 | INHALATION_SPRAY | RESPIRATORY_TRACT | Status: DC | PRN

## 2016-12-19 MED ORDER — MORPHINE SULFATE (PF) 4 MG/ML IV SOLN
4.0000 mg | INTRAVENOUS | Status: AC | PRN
  Administered 2016-12-19 (×2): 4 mg via INTRAVENOUS
  Filled 2016-12-19 (×2): qty 1

## 2016-12-19 MED ORDER — FOLIC ACID 1 MG PO TABS
1.0000 mg | ORAL_TABLET | Freq: Every day | ORAL | Status: DC
  Administered 2016-12-19 – 2016-12-23 (×4): 1 mg via ORAL
  Filled 2016-12-19 (×4): qty 1

## 2016-12-19 MED ORDER — SODIUM CHLORIDE 0.9% FLUSH
3.0000 mL | Freq: Two times a day (BID) | INTRAVENOUS | Status: DC
  Administered 2016-12-19 – 2016-12-23 (×6): 3 mL via INTRAVENOUS

## 2016-12-19 MED ORDER — METOPROLOL SUCCINATE ER 25 MG PO TB24
25.0000 mg | ORAL_TABLET | Freq: Every day | ORAL | Status: DC
  Filled 2016-12-19 (×2): qty 1

## 2016-12-19 MED ORDER — ACETAMINOPHEN 325 MG PO TABS: 650.0000 mg | ORAL_TABLET | Freq: Four times a day (QID) | ORAL | Status: DC | PRN

## 2016-12-19 MED ORDER — SODIUM CHLORIDE 0.9% FLUSH: 3.0000 mL | INTRAVENOUS | Status: DC | PRN

## 2016-12-19 MED ORDER — METOCLOPRAMIDE HCL 5 MG PO TABS
5.0000 mg | ORAL_TABLET | Freq: Three times a day (TID) | ORAL | Status: DC
  Administered 2016-12-19 – 2016-12-23 (×11): 5 mg via ORAL
  Filled 2016-12-19 (×12): qty 1

## 2016-12-19 MED ORDER — OXYCODONE HCL 5 MG PO TABS
10.0000 mg | ORAL_TABLET | Freq: Four times a day (QID) | ORAL | Status: DC | PRN
Start: 2016-12-19 — End: 2016-12-20
  Administered 2016-12-19 – 2016-12-20 (×2): 10 mg via ORAL
  Filled 2016-12-19 (×2): qty 2

## 2016-12-19 MED ORDER — HYDRALAZINE HCL 20 MG/ML IJ SOLN
10.0000 mg | Freq: Four times a day (QID) | INTRAMUSCULAR | Status: DC | PRN
Start: 2016-12-19 — End: 2016-12-23

## 2016-12-19 MED ORDER — SODIUM CHLORIDE 0.9 % IV SOLN
INTRAVENOUS | Status: DC
  Administered 2016-12-19 – 2016-12-20 (×2): via INTRAVENOUS

## 2016-12-19 MED ORDER — ONDANSETRON HCL 4 MG PO TABS: 4.0000 mg | ORAL_TABLET | Freq: Four times a day (QID) | ORAL | Status: DC | PRN

## 2016-12-19 MED ORDER — VITAMIN B-1 100 MG PO TABS
100.0000 mg | ORAL_TABLET | Freq: Every day | ORAL | Status: DC
  Administered 2016-12-19 – 2016-12-23 (×4): 100 mg via ORAL
  Filled 2016-12-19 (×3): qty 1

## 2016-12-19 MED ORDER — SIMVASTATIN 10 MG PO TABS
10.0000 mg | ORAL_TABLET | Freq: Every day | ORAL | Status: DC
  Administered 2016-12-21 – 2016-12-23 (×3): 10 mg via ORAL
  Filled 2016-12-19 (×3): qty 1

## 2016-12-19 MED ORDER — GUAIFENESIN ER 600 MG PO TB12
600.0000 mg | ORAL_TABLET | Freq: Two times a day (BID) | ORAL | Status: DC
  Administered 2016-12-19 – 2016-12-23 (×6): 600 mg via ORAL
  Filled 2016-12-19 (×13): qty 1

## 2016-12-19 MED ORDER — ADULT MULTIVITAMIN W/MINERALS CH
1.0000 | ORAL_TABLET | Freq: Every day | ORAL | Status: DC
  Administered 2016-12-19 – 2016-12-23 (×4): 1 via ORAL
  Filled 2016-12-19 (×4): qty 1

## 2016-12-19 MED ORDER — HEPARIN SODIUM (PORCINE) 5000 UNIT/ML IJ SOLN: 5000.0000 [IU] | Freq: Three times a day (TID) | INTRAMUSCULAR | Status: DC

## 2016-12-19 MED ORDER — LISINOPRIL 20 MG PO TABS
20.0000 mg | ORAL_TABLET | Freq: Every day | ORAL | Status: DC
  Filled 2016-12-19 (×2): qty 1

## 2016-12-19 MED ORDER — PANTOPRAZOLE SODIUM 40 MG PO TBEC
40.0000 mg | DELAYED_RELEASE_TABLET | Freq: Every day | ORAL | Status: DC
  Administered 2016-12-21 – 2016-12-23 (×3): 40 mg via ORAL
  Filled 2016-12-19 (×3): qty 1

## 2016-12-19 MED ORDER — POLYETHYLENE GLYCOL 3350 17 G PO PACK: 17.0000 g | PACK | Freq: Every day | ORAL | Status: DC | PRN

## 2016-12-19 MED ORDER — TRAZODONE HCL 50 MG PO TABS: 50.0000 mg | ORAL_TABLET | Freq: Every evening | ORAL | Status: DC | PRN

## 2016-12-19 MED ORDER — NICOTINE 21 MG/24HR TD PT24
21.0000 mg | MEDICATED_PATCH | Freq: Every day | TRANSDERMAL | Status: DC
  Administered 2016-12-21 – 2016-12-23 (×3): 21 mg via TRANSDERMAL
  Filled 2016-12-19 (×3): qty 1

## 2016-12-19 MED ORDER — NITROGLYCERIN 0.4 MG SL SUBL: 0.4000 mg | SUBLINGUAL_TABLET | SUBLINGUAL | Status: DC | PRN

## 2016-12-19 MED ORDER — SODIUM CHLORIDE 0.9 % IV SOLN: 250.0000 mL | INTRAVENOUS | Status: DC | PRN

## 2016-12-19 MED ORDER — MOMETASONE FURO-FORMOTEROL FUM 200-5 MCG/ACT IN AERO
2.0000 | INHALATION_SPRAY | Freq: Two times a day (BID) | RESPIRATORY_TRACT | Status: DC
  Administered 2016-12-19 – 2016-12-23 (×7): 2 via RESPIRATORY_TRACT
  Filled 2016-12-19: qty 8.8

## 2016-12-19 MED ORDER — SENNA 8.6 MG PO TABS
1.0000 | ORAL_TABLET | Freq: Two times a day (BID) | ORAL | Status: DC
  Administered 2016-12-20 – 2016-12-22 (×4): 8.6 mg via ORAL
  Filled 2016-12-19 (×7): qty 1

## 2016-12-19 MED ORDER — IRON 325 (65 FE) MG PO TABS: 1.0000 | ORAL_TABLET | Freq: Every day | ORAL | Status: DC

## 2016-12-19 MED ORDER — LORAZEPAM 2 MG/ML IJ SOLN: 1.0000 mg | Freq: Four times a day (QID) | INTRAMUSCULAR | Status: DC | PRN

## 2016-12-19 NOTE — H&P (Signed)
Patient Demographics:    Bradley [name], is a 76 y.o. male  MRN: [id number]   DOB - 1940/03/20  Admit Date - 12/19/2016  Outpatient Primary MD for the patient is Jani Gravel, MD   Assessment & Plan:    Principal Problem:   Rt Hip fracture [facility]) Active Problems:   Tobacco abuse   Emphysema, unspecified ([place])   CAD (coronary artery disease), native coronary artery   Essential hypertension   Leukocytosis    LHC 09/12/2016   2nd Mrg lesion, 30 %stenosed.  The left ventricular systolic function is normal.  LV end diastolic pressure is normal.  The left ventricular ejection fraction is 45-50% by visual estimate.  There is no mitral valve regurgitation.   1. Mild non-obstructive CAD 2. Low normal LV systolic function with mild hypokinesis of the apical wall.  3. Normal filling pressures  Recommendations: Medical management of mild CAD with ASA and statin. Smoking cessation. He will need an echo before discharge to get a better assessment of his wall motion abnormality   1)Rt Hip Fx-appears to be mechanical fall with right hip fracture, orthopedic Surgeon at [facility] ([place]) requested transferred Transfer to [facility] as no anesthesiologist is available attending pain this weekend (they have a nurse Anesthetist), Dr Griffin Basil (orthopedic surgeon at [facility] is a septic this patient for transfer), possible ORIF of right hip fracture on 12/20/2016.  Pain control as ordered  2)COPD-patient continues to smoke, no acute COPD exacerbation at this time, continue bronchodilators, chest x-ray without acute cardio-pulmonary findings at this time, no hypoxia, O2 sat on room air 98%.   3)HFrEF-history of chronic systolic dysfunction CHF without acute exacerbation at this time, continue lisinopril 20 mg daily,  be judicious with IV fluids to avoid volume overload  4)H/o Mild nonobstructive CAD-please see left heart catheterization report from 09/12/2016 above, medical management advised at the time, patient continues to smoke, He is not ready to quit, continue aspirin 81 mg daily, Toprol-XL 25 mg daily and simvastatin 10 mg daily as ordered  5)H/o HTN-BP is not at goal, could restart amlodipine 10 mg daily, Toprol-XL 25 mg daily, and lisinopril 20 mg daily,   may use IV Hydralazine 10 mg  Every 4 hours Prn for systolic blood pressure over 160 mmhg  6)Tobacco Abuse-patient used to smoke 4 packs a day,  he is down to just over 2 pack a day now, nicotine patch as ordered, patient is not ready to quit smoking, encourage incentive spirometry and bronchodilators  7)Leukocytosis-leukocytosis may be reactive in the setting of acute right hip fracture, chest x-ray without acute infectious process, UA not suggestive of UTI, blood cultures are pending, patient has no fevers or chills or other evidence of systemic infection at this time  With History of - Reviewed by me  Past Medical History:  Diagnosis Date  . Chronic lower back pain   . Contact with stonefish as cause of accidental injury  1954   "stung across my left hand in [place]; LUE weaker since"  . Coronary artery disease   . High cholesterol   . History of blood transfusion 1943  . Hypertension   . Migraine    "none since the 1990s" (09/11/2016)  . Myocardial infarction ([place]) 01/20/1993; 02/28/1993  . Pneumonia    "several times" (09/11/2016)      Past Surgical History:  Procedure Laterality Date  . APPENDECTOMY    . CARDIAC CATHETERIZATION    . CATARACT EXTRACTION, BILATERAL Bilateral   . JOINT REPLACEMENT    . LEFT HEART CATH AND CORONARY ANGIOGRAPHY N/A 09/12/2016   Procedure: LEFT HEART CATH AND CORONARY ANGIOGRAPHY;  Surgeon: Burnell Blanks, MD;  Location: [place] [facility];  Service: Cardiovascular;  Laterality: N/A;  . NASAL  ENDOSCOPY WITH EPISTAXIS CONTROL Right 03/15/2016   Procedure: NASAL ENDOSCOPY WITH EPISTAXIS CONTROL;  Surgeon: Jodi Marble, MD;  Location: [place];  Service: ENT;  Laterality: Right;  . SEPTOPLASTY Right 03/15/2016   Procedure: SEPTOPLASTY;  Surgeon: Jodi Marble, MD;  Location: [place];  Service: ENT;  Laterality: Right;  . SINUS EXPLORATION Right 03/15/2016   Procedure: SINUS EXPLORATION;  Surgeon: Jodi Marble, MD;  Location: [place];  Service: ENT;  Laterality: Right;  . TONSILLECTOMY    . TOOTH EXTRACTION    . TOTAL KNEE ARTHROPLASTY Left      Chief Complaint  Patient presents with  . Fall    right hip injury      HPI:    Bradley [name]  is a 76 y.o. male past medical history relevant for COPD, nonobstructive COPD, hypertension and tobacco abuse who presents to the ED with right hip pain that started after a fall today Per EMS and pt report, c/o sudden onset and persistence of constant right hip "pain" that began earlier today. Pt states he tripped and fell, landing directly onto his right hip. Pt states he was able to crawl to his bed, but unable to stand/ambulate due to right hip pain. EMS gave IV morphine en route. Pt denies syncope, no LOC, no AMS, no head injury, no neck or back pain, no abd pain, no N/V/D, no CP/SOB, no focal motor weakness, no tingling/numbness in extremities.   Portions of the history of present illness (HPI) was obtained from ED team/records, reasonable attempt to verify accuracy of information taken by me  Additional history obtained from family members at bedside   ED provider contacted local orthopedic surgeon  orthopedic Surgeon at [facility] ([place]) requested transferred Transfer to [facility] as no anesthesiologist is available attending pain this weekend (they have a nurse Anesthetist), Dr Griffin Basil (orthopedic surgeon at [facility] is a septic this patient for transfer), possible ORIF of right hip fracture on 12/20/2016.           Review of  systems:    In addition to the HPI above,   A full 12 point Review of 10 Systems was done, except as stated above, all other Review of 10 Systems were negative.    Social History:  Reviewed by me    Social History   Tobacco Use  . Smoking status: Current Every Day Smoker    Packs/day: 1.00    Years: 68.00    Pack years: 68.00    Types: Cigarettes  . Smokeless tobacco: Never Used  Substance Use Topics  . Alcohol use: No    Comment: 09/11/2016 "drank from the age of 5 til 80"  Family History :  Reviewed by me   History reviewed. No pertinent family history. H/o HTN   Home Medications:   Prior to Admission medications   Medication Sig Start Date End Date Taking? Authorizing Provider  ADVAIR HFA 115-21 MCG/ACT inhaler Inhale 2 puffs into the lungs every 12 (twelve) hours. 01/29/16  Yes [provider]  amLODipine (NORVASC) 5 MG tablet Take 1 tablet (5 mg total) by mouth daily. 09/13/16  Yes Dunn, Nedra Hai, PA-C  aspirin EC 81 MG tablet Take 1 tablet by mouth daily.   Yes [provider]  celecoxib (CELEBREX) 100 MG capsule Take 1 capsule by mouth 2 (two) times daily. 09/11/16  Yes [provider]  Ferrous Sulfate (IRON) 325 (65 Fe) MG TABS Take 1 tablet by mouth daily. 12/10/15  Yes [provider]  gabapentin (NEURONTIN) 300 MG capsule Take 1 capsule by mouth 3 (three) times daily. 08/15/16  Yes [provider]  lisinopril (PRINIVIL,ZESTRIL) 10 MG tablet Take 1 tablet (10 mg total) by mouth daily. 09/12/16  Yes Dunn, Dayna N, PA-C  metoCLOPramide (REGLAN) 5 MG tablet Take 1 tablet by mouth 4 (four) times daily -  before meals and at bedtime. 01/25/16  Yes [provider]  metoprolol succinate (TOPROL-XL) 25 MG 24 hr tablet Take 1 tablet by mouth daily. 02/19/16  Yes [provider]  nitroGLYCERIN (NITROSTAT) 0.4 MG SL tablet Place 0.4 mg under the tongue every 5 (five) minutes as needed for chest pain.   Yes  [provider]  omeprazole (PRILOSEC) 20 MG capsule Take 40 mg by mouth daily.    Yes [provider]  Oxycodone HCl 10 MG TABS Take 1 tablet by mouth 3 (three) times daily. 02/28/16  Yes [provider]  potassium chloride 20 MEQ TBCR Take 1 tablet (20 mEq) by mouth daily. 09/12/16  Yes Dunn, Dayna N, PA-C  simvastatin (ZOCOR) 10 MG tablet Take 10 mg by mouth daily. 12/23/15  Yes [provider]  VENTOLIN HFA 108 (90 Base) MCG/ACT inhaler Inhale 2 puffs into the lungs every 4 (four) hours as needed for wheezing or shortness of breath.  12/11/15   [provider]     Allergies:    No Known Allergies   Physical Exam:   Vitals  Blood pressure (!) 164/72, pulse 73, temperature 97.9 F (36.6 C), temperature source Oral, resp. rate 16, height 6' (1.829 m), weight 65.8 kg (145 lb), SpO2 93 %.  Physical Examination: General appearance - alert, well appearing, and in no distress Mental status - alert, oriented to person, place, and time,  Eyes - sclera anicteric Neck - supple, no JVD elevation , Chest - clear  to auscultation bilaterally, symmetrical air movement,  Heart - S1 and S2 normal,  Abdomen - soft, nontender, nondistended, no masses or organomegaly Neurological - screening mental status exam normal, neck supple without rigidity, cranial nerves II through XII intact, DTR's normal and symmetric Extremities - no pedal edema noted, intact peripheral pulses , right lower extremity is shortened and rotated, point tenderness over the right hip area Noted Skin - warm, dry    Data Review:    CBC Recent Labs  Lab 12/19/16 1332  WBC 16.0*  HGB 16.3  HCT 50.1  PLT 426*  MCV 93.3  MCH 30.4  MCHC 32.5  RDW 15.6*  LYMPHSABS 3.4  MONOABS 1.0  EOSABS 0.1  BASOSABS 0.0   ------------------------------------------------------------------------------------------------------------------  Chemistries  Recent Labs  Lab 12/19/16 1332  NA  133*  K 4.0  CL 97*  CO2 23  GLUCOSE 107*  BUN 15  CREATININE 0.87  CALCIUM 9.6   ------------------------------------------------------------------------------------------------------------------ estimated creatinine clearance is 67.2 mL/min (by C-G formula based on SCr of 0.87 mg/dL). ------------------------------------------------------------------------------------------------------------------ No results for input(s): TSH, T4TOTAL, T3FREE, THYROIDAB in the last 72 hours.  Invalid input(s): FREET3   Coagulation profile Recent Labs  Lab 12/19/16 1332  INR 0.95   ------------------------------------------------------------------------------------------------------------------- No results for input(s): DDIMER in the last 72 hours. -------------------------------------------------------------------------------------------------------------------  Cardiac Enzymes Recent Labs  Lab 12/19/16 1332  TROPONINI <0.03   ------------------------------------------------------------------------------------------------------------------ No results found for: BNP   ---------------------------------------------------------------------------------------------------------------  Urinalysis    Component Value Date/Time   COLORURINE YELLOW 12/19/2016 [place] 12/19/2016 1505   LABSPEC 1.006 12/19/2016 1505   PHURINE 7.0 12/19/2016 1505   GLUCOSEU NEGATIVE 12/19/2016 1505   HGBUR NEGATIVE 12/19/2016 1505   BILIRUBINUR NEGATIVE 12/19/2016 1505   KETONESUR 5 (A) 12/19/2016 1505   PROTEINUR NEGATIVE 12/19/2016 1505   NITRITE NEGATIVE 12/19/2016 1505   LEUKOCYTESUR NEGATIVE 12/19/2016 1505    ----------------------------------------------------------------------------------------------------------------   Imaging Results:    Dg Chest 1 View  Result Date: 12/19/2016 CLINICAL DATA:  Fall this morning.  Hip fracture. EXAM: CHEST 1 VIEW COMPARISON:  Chest x-ray  dated 09/11/2016. FINDINGS: Heart size and mediastinal contours are normal. Atherosclerotic changes noted at the aortic arch. Coarse lung markings again noted bilaterally indicating chronic interstitial lung disease/ fibrosis. No confluent opacity to suggest a developing pneumonia. No pleural effusion or pneumothorax seen. No acute or suspicious osseous finding. IMPRESSION: 1. No active disease.  No evidence of pneumonia or pulmonary edema. 2. Chronic interstitial lung disease/fibrosis. 3. Aortic atherosclerosis. Electronically Signed   By: Franki Cabot M.D.   On: 12/19/2016 13:30   Dg Hip Unilat W Or Wo Pelvis 2-3 Views Right  Result Date: 12/19/2016 CLINICAL DATA:  Right hip pain after fall. EXAM: DG HIP (WITH OR WITHOUT PELVIS) 2-3V RIGHT COMPARISON:  None. FINDINGS: Moderately displaced and possibly comminuted fracture is seen involving the proximal right femoral neck. Left hip appears normal. IMPRESSION: Moderately displaced and possibly comminuted proximal right femoral neck fracture. Electronically Signed   By: Marijo Conception, M.D.   On: 12/19/2016 13:30    Radiological Exams on Admission: Dg Chest 1 View  Result Date: 12/19/2016 CLINICAL DATA:  Fall this morning.  Hip fracture. EXAM: CHEST 1 VIEW COMPARISON:  Chest x-ray dated 09/11/2016. FINDINGS: Heart size and mediastinal contours are normal. Atherosclerotic changes noted at the aortic arch. Coarse lung markings again noted bilaterally indicating chronic interstitial lung disease/ fibrosis. No confluent opacity to suggest a developing pneumonia. No pleural effusion or pneumothorax seen. No acute or suspicious osseous finding. IMPRESSION: 1. No active disease.  No evidence of pneumonia or pulmonary edema. 2. Chronic interstitial lung disease/fibrosis. 3. Aortic atherosclerosis. Electronically Signed   By: Franki Cabot M.D.   On: 12/19/2016 13:30   Dg Hip Unilat W Or Wo Pelvis 2-3 Views Right  Result Date: 12/19/2016 CLINICAL DATA:   Right hip pain after fall. EXAM: DG HIP (WITH OR WITHOUT PELVIS) 2-3V RIGHT COMPARISON:  None. FINDINGS: Moderately displaced and possibly comminuted fracture is seen involving the proximal right femoral neck. Left hip appears normal. IMPRESSION: Moderately displaced and possibly comminuted proximal right femoral neck fracture. Electronically Signed   By: Marijo Conception, M.D.   On: 12/19/2016 13:30    DVT Prophylaxis -SCD   AM Labs Ordered, also please review Full Orders  Family Communication: Admission, patients condition and plan of care including tests being ordered have been discussed with the patient and family members at who indicate understanding and agree with the plan   Code Status - Full Code  Likely DC to  To SNF post hip surgery  Condition   Stable  Roxan Hockey M.D on 12/19/2016 at 10:31 PM   Between 7am to 7pm - Pager - [phone] After 7pm go to [email or web address] - password [email or web address]  [facility] - Office  [phone]  Voice Recognition Viviann Spare dictation system was used to create this note, attempts have been made to correct errors. Please contact the author with questions and/or clarifications.

## 2016-12-19 NOTE — ED Notes (Signed)
Patient transported to X-ray 

## 2016-12-19 NOTE — ED Notes (Signed)
About to pull apresoline for bp but last sbp at 148.  Apresoline not given due to bp.

## 2016-12-19 NOTE — ED Triage Notes (Signed)
Pt tripped avoiding his granddaughter and fell on right hip.  Pt was able to get back into bed but has been unable to ambulate.  Given Morphine 6mg  en route by ems.

## 2016-12-19 NOTE — Progress Notes (Signed)
Xcover: Pt has had recent cardiac catheterization 09/12/2016, => EF 45-50%, nonobstructive CAD2nd mrg lesion 30% stenosis ([place])  CXR => no active disease, no evidence of pneumonia or pulmonary edema, chronic ILD Pox 98% on RA  Pt is medically cleared to have surgery Discontinue [place] heparin per orthopedic request.  [facility] prefers Lovenox for DVT prophylaxis, please d/w them tomorrow Orthopedics would like to take patient to OR in am

## 2016-12-19 NOTE — ED Provider Notes (Signed)
[facility] Provider Note   CSN: [id number] Arrival date & time: 12/19/16  1242     History   Chief Complaint Chief Complaint  Patient presents with  . Fall    right hip injury    HPI Bradley [name] is a 76 y.o. male.  HPI Pt was seen at 1325. Per EMS and pt report, c/o sudden onset and persistence of constant right hip "pain" that began earlier today. Pt states he tripped and fell, landing directly onto his right hip. Pt states he was able to crawl to his bed, but unable to stand/ambulate due to right hip pain. EMS gave IV morphine en route. Pt denies syncope, no LOC, no AMS, no head injury, no neck or back pain, no abd pain, no N/V/D, no CP/SOB, no focal motor weakness, no tingling/numbness in extremities.   Past Medical History:  Diagnosis Date  . Chronic lower back pain   . Contact with stonefish as cause of accidental injury 1954   "stung across my left hand in [place]; LUE weaker since"  . Coronary artery disease   . High cholesterol   . History of blood transfusion 1943  . Hypertension   . Migraine    "none since the 1990s" (09/11/2016)  . Myocardial infarction ([place]) 01/20/1993; 02/28/1993  . Pneumonia    "several times" (09/11/2016)    Patient Active Problem List   Diagnosis Date Noted  . Chest pain 09/12/2016  . Leukocytosis 09/12/2016  . Abnormal chest x-ray 09/12/2016  . Hyperlipidemia 09/12/2016  . Hypokalemia 09/12/2016  . Precordial pain   . Tobacco abuse 09/11/2016  . Emphysema, unspecified ([place]) 09/11/2016  . CAD (coronary artery disease), native coronary artery 09/11/2016  . Chronic pain syndrome 09/11/2016  . Essential hypertension 09/11/2016  . Right-sided epistaxis 03/15/2016    Past Surgical History:  Procedure Laterality Date  . APPENDECTOMY    . CARDIAC CATHETERIZATION    . CATARACT EXTRACTION, BILATERAL Bilateral   . JOINT REPLACEMENT    . LEFT HEART CATH AND CORONARY ANGIOGRAPHY N/A 09/12/2016   Procedure: LEFT HEART  CATH AND CORONARY ANGIOGRAPHY;  Surgeon: Burnell Blanks, MD;  Location: [facility];  Service: Cardiovascular;  Laterality: N/A;  . NASAL ENDOSCOPY WITH EPISTAXIS CONTROL Right 03/15/2016   Procedure: NASAL ENDOSCOPY WITH EPISTAXIS CONTROL;  Surgeon: Jodi Marble, MD;  Location: [facility];  Service: ENT;  Laterality: Right;  . SEPTOPLASTY Right 03/15/2016   Procedure: SEPTOPLASTY;  Surgeon: Jodi Marble, MD;  Location: [facility];  Service: ENT;  Laterality: Right;  . SINUS EXPLORATION Right 03/15/2016   Procedure: SINUS EXPLORATION;  Surgeon: Jodi Marble, MD;  Location: [facility];  Service: ENT;  Laterality: Right;  . TONSILLECTOMY    . TOOTH EXTRACTION    . TOTAL KNEE ARTHROPLASTY Left        Home Medications    Prior to Admission medications   Medication Sig Start Date End Date Taking? Authorizing Provider  ADVAIR HFA 115-21 MCG/ACT inhaler Inhale 2 puffs into the lungs every 12 (twelve) hours. 01/29/16   [provider]  amLODipine (NORVASC) 5 MG tablet Take 1 tablet (5 mg total) by mouth daily. 09/13/16   Charlie Pitter, PA-C  aspirin EC 81 MG tablet Take 1 tablet by mouth daily.    [provider]  celecoxib (CELEBREX) 100 MG capsule Take 1 capsule by mouth 2 (two) times daily. 09/11/16   [provider]  Ferrous Sulfate (IRON) 325 (65 Fe) MG TABS Take  1 tablet by mouth daily. 12/10/15   [provider]  gabapentin (NEURONTIN) 300 MG capsule Take 1 capsule by mouth 3 (three) times daily. 08/15/16   [provider]  lisinopril (PRINIVIL,ZESTRIL) 10 MG tablet Take 1 tablet (10 mg total) by mouth daily. 09/12/16   Dunn, Nedra Hai, PA-C  metoCLOPramide (REGLAN) 5 MG tablet Take 1 tablet by mouth 4 (four) times daily -  before meals and at bedtime. 01/25/16   [provider]  metoprolol succinate (TOPROL-XL) 25 MG 24 hr tablet Take 1 tablet by mouth daily. 02/19/16   [provider]  nitroGLYCERIN (NITROSTAT) 0.4 MG SL tablet Place  0.4 mg under the tongue every 5 (five) minutes as needed for chest pain.    [provider]  omeprazole (PRILOSEC) 20 MG capsule Take 20 mg by mouth 2 (two) times daily.    [provider]  Oxycodone HCl 10 MG TABS Take 1 tablet by mouth 3 (three) times daily. 02/28/16   [provider]  potassium chloride 20 MEQ TBCR Take 1 tablet (20 mEq) by mouth daily. 09/12/16   Dunn, Nedra Hai, PA-C  simvastatin (ZOCOR) 10 MG tablet Take 10 mg by mouth daily. 12/23/15   [provider]  VENTOLIN HFA 108 (90 Base) MCG/ACT inhaler Inhale 2 puffs into the lungs every 4 (four) hours as needed for wheezing or shortness of breath.  12/11/15   [provider]    Family History History reviewed. No pertinent family history.  Social History Social History   Tobacco Use  . Smoking status: Current Every Day Smoker    Packs/day: 1.00    Years: 68.00    Pack years: 68.00    Types: Cigarettes  . Smokeless tobacco: Never Used  Substance Use Topics  . Alcohol use: No    Comment: 09/11/2016 "drank from the age of 68 til 23"  . Drug use: No     Allergies   Patient has no known allergies.   Review of Systems Review of Systems ROS: Statement: All systems negative except as marked or noted in the HPI; Constitutional: Negative for fever and chills. ; ; Eyes: Negative for eye pain, redness and discharge. ; ; ENMT: Negative for ear pain, hoarseness, nasal congestion, sinus pressure and sore throat. ; ; Cardiovascular: Negative for chest pain, palpitations, diaphoresis, dyspnea and peripheral edema. ; ; Respiratory: Negative for cough, wheezing and stridor. ; ; Gastrointestinal: Negative for nausea, vomiting, diarrhea, abdominal pain, blood in stool, hematemesis, jaundice and rectal bleeding. . ; ; Genitourinary: Negative for dysuria, flank pain and hematuria. ; ; Musculoskeletal: Negative for back pain and neck pain. +right hip pain; ; Skin: Negative for pruritus, rash,  abrasions, blisters, bruising and skin lesion.; ; Neuro: Negative for headache, lightheadedness and neck stiffness. Negative for weakness, altered level of consciousness, altered mental status, extremity weakness, paresthesias, involuntary movement, seizure and syncope.       Physical Exam Updated Vital Signs BP (!) 164/94 (BP Location: Left Arm)   Pulse 63   Temp 97.6 F (36.4 C) (Oral)   Resp 18   Ht 6' (1.829 m)   Wt 65.8 kg (145 lb)   SpO2 100%   BMI 19.67 kg/m   Physical Exam 1330: Physical examination:  Nursing notes reviewed; Vital signs and O2 SAT reviewed;  Constitutional: Well developed, Well nourished, Well hydrated, In no acute distress; Head:  Normocephalic, atraumatic; Eyes: EOMI, PERRL, No scleral icterus; ENMT: Mouth and pharynx normal, Mucous membranes moist; Neck:  Supple, Full range of motion, No lymphadenopathy; Cardiovascular: Regular rate and rhythm, No gallop; Respiratory: Breath sounds clear & equal bilaterally, No wheezes.  Speaking full sentences with ease, Normal respiratory effort/excursion; Chest: Nontender, Movement normal; Abdomen: Soft, Nontender, Nondistended, Normal bowel sounds; Genitourinary: No CVA tenderness; Spine:  No midline CS, TS, LS tenderness.;;  Extremities: Pulses normal, Pelvis stable. +TTP right hip. NT right knee/ankle/foot. No edema, No calf tenderness, edema or asymmetry.; Neuro: AA&Ox3, Major CN grossly intact. No facial droop. Speech clear. No gross focal motor or sensory deficits in extremities.; Skin: Color normal, Warm, Dry.   ED Treatments / Results  Labs (all labs ordered are listed, but only abnormal results are displayed)   EKG  EKG Interpretation  Date/Time:  Friday December 19 2016 15:12:22 EST Ventricular Rate:  64 PR Interval:    QRS Duration: 93 QT Interval:  421 QTC Calculation: 435 R Axis:   18 Text Interpretation:  Sinus rhythm When compared with ECG of 09/12/2016 No significant change was found Confirmed by  Francine Graven [phone]) on 12/19/2016 3:14:52 PM       Radiology   Procedures Procedures (including critical care time)  Medications Ordered in ED Medications  morphine 4 MG/ML injection 4 mg (4 mg Intravenous Given 12/19/16 1400)     Initial Impression / Assessment and Plan / ED Course  I have reviewed the triage vital signs and the nursing notes.  Pertinent labs & imaging results that were available during my care of the patient were reviewed by me and considered in my medical decision making (see chart for details).  MDM Reviewed: previous chart, nursing note and vitals Reviewed previous: labs and ECG Interpretation: labs, ECG and x-ray   Results for orders placed or performed during the hospital encounter of 60/45/40  Basic metabolic panel  Result Value Ref Range   Sodium 133 (L) 135 - 145 mmol/L   Potassium 4.0 3.5 - 5.1 mmol/L   Chloride 97 (L) 101 - 111 mmol/L   CO2 23 22 - 32 mmol/L   Glucose, Bld 107 (H) 65 - 99 mg/dL   BUN 15 6 - 20 mg/dL   Creatinine, Ser 0.87 0.61 - 1.24 mg/dL   Calcium 9.6 8.9 - 10.3 mg/dL   GFR calc non Af Amer >60 >60 mL/min   GFR calc Af Amer >60 >60 mL/min   Anion gap 13 5 - 15  Troponin I  Result Value Ref Range   Troponin I <0.03 <0.03 ng/mL  CBC with Differential  Result Value Ref Range   WBC 16.0 (H) 4.0 - 10.5 K/uL   RBC 5.37 4.22 - 5.81 MIL/uL   Hemoglobin 16.3 13.0 - 17.0 g/dL   HCT 50.1 39.0 - 52.0 %   MCV 93.3 78.0 - 100.0 fL   MCH 30.4 26.0 - 34.0 pg   MCHC 32.5 30.0 - 36.0 g/dL   RDW 15.6 (H) 11.5 - 15.5 %   Platelets 426 (H) 150 - 400 K/uL   Neutrophils Relative % 73 %   Neutro Abs 11.5 (H) 1.7 - 7.7 K/uL   Lymphocytes Relative 21 %   Lymphs Abs 3.4 0.7 - 4.0 K/uL   Monocytes Relative 6 %   Monocytes Absolute 1.0 0.1 - 1.0 K/uL   Eosinophils Relative 0 %   Eosinophils Absolute 0.1 0.0 - 0.7 K/uL   Basophils Relative 0 %   Basophils Absolute 0.0 0.0 - 0.1 K/uL  Protime-INR  Result Value Ref Range    Prothrombin Time 12.6  11.4 - 15.2 seconds   INR 0.95      Dg Chest 1 View Result Date: 12/19/2016 CLINICAL DATA:  Fall this morning.  Hip fracture. EXAM: CHEST 1 VIEW COMPARISON:  Chest x-ray dated 09/11/2016. FINDINGS: Heart size and mediastinal contours are normal. Atherosclerotic changes noted at the aortic arch. Coarse lung markings again noted bilaterally indicating chronic interstitial lung disease/ fibrosis. No confluent opacity to suggest a developing pneumonia. No pleural effusion or pneumothorax seen. No acute or suspicious osseous finding. IMPRESSION: 1. No active disease.  No evidence of pneumonia or pulmonary edema. 2. Chronic interstitial lung disease/fibrosis. 3. Aortic atherosclerosis. Electronically Signed   By: Franki Cabot M.D.   On: 12/19/2016 13:30   Dg Hip Unilat W Or Wo Pelvis 2-3 Views Right Result Date: 12/19/2016 CLINICAL DATA:  Right hip pain after fall. EXAM: DG HIP (WITH OR WITHOUT PELVIS) 2-3V RIGHT COMPARISON:  None. FINDINGS: Moderately displaced and possibly comminuted fracture is seen involving the proximal right femoral neck. Left hip appears normal. IMPRESSION: Moderately displaced and possibly comminuted proximal right femoral neck fracture. Electronically Signed   By: Marijo Conception, M.D.   On: 12/19/2016 13:30     1350:  XR as above. T/C to [facility] Dr. Aline Brochure, case discussed, including:  HPI, pertinent PM/SHx, VS/PE, dx testing, ED course and treatment:  No Anesthesiologist on holiday/weekend, requests to transfer to [facility].  1400:  T/C to [facility] Dr. Griffin Basil, case discussed, including:  HPI, pertinent PM/SHx, VS/PE, dx testing, ED course and treatment:  Agreeable to consult, likely OR tomorrow, requests to transfer/admit under [facility] service.   1520:  T/C to [facility] Dr. Denton Brick, case discussed, including:  HPI, pertinent PM/SHx, VS/PE, dx testing, ED course and treatment:  Agreeable to facilitate transfer to [facility] for admit.     Final Clinical  Impressions(s) / ED Diagnoses   Final diagnoses:  Hip fx [facility])    ED Discharge Orders    None       Francine Graven, DO 12/24/16 1834

## 2016-12-19 NOTE — Anesthesia Preprocedure Evaluation (Addendum)
Anesthesia Evaluation  Patient identified by MRN, date of birth, ID band Patient awake    Reviewed: Allergy & Precautions, NPO status , Patient's Chart, lab work & pertinent test results  Airway Mallampati: III  TM Distance: >3 FB Neck ROM: Full    Dental  (+) Edentulous Upper, Edentulous Lower   Pulmonary COPD,  COPD inhaler, Current Smoker,    breath sounds clear to auscultation       Cardiovascular hypertension, Pt. on medications and Pt. on home beta blockers + angina + CAD and + Past MI   Rhythm:Regular Rate:Normal  Sees cardiologist  ECG: SR, rate 64  1. Mild non-obstructive CAD 2. Low normal LV systolic function with mild hypokinesis of the apical wall.  3. Normal filling pressures   Neuro/Psych negative neurological ROS     GI/Hepatic negative GI ROS, Neg liver ROS,   Endo/Other  negative endocrine ROS  Renal/GU negative Renal ROS     Musculoskeletal Chronic lower back pain   Abdominal   Peds  Hematology  (+) anemia ,   Anesthesia Other Findings   Reproductive/Obstetrics                            Anesthesia Physical  Anesthesia Plan  ASA: IV  Anesthesia Plan: General   Post-op Pain Management:    Induction: Intravenous  PONV Risk Score and Plan: 3 and Ondansetron and Dexamethasone  Airway Management Planned: Oral ETT  Additional Equipment:   Intra-op Plan:   Post-operative Plan: Extubation in OR and Possible Post-op intubation/ventilation  Informed Consent: I have reviewed the patients History and Physical, chart, labs and discussed the procedure including the risks, benefits and alternatives for the proposed anesthesia with the patient or authorized representative who has indicated his/her understanding and acceptance.   Dental advisory given  Plan Discussed with: CRNA  Anesthesia Plan Comments:        Anesthesia Quick Evaluation

## 2016-12-19 NOTE — Consult Note (Signed)
Formal consult note to follow.    Called about patient from [facility], local orthopedist not comfortable proceeding due to patient medical comorbidities and specifically heart health as anesthesia staff reportedly not available on weekend.    Patient will need urgent medical clearance, NPO status and hold anticoagulants to hopefully have surgery tomorrow 12/20/16 if possible.  If clear will plan on Right hip hemiarthroplasty tomorrow.

## 2016-12-20 ENCOUNTER — Inpatient Hospital Stay (HOSPITAL_COMMUNITY): Payer: Medicare HMO

## 2016-12-20 ENCOUNTER — Encounter (HOSPITAL_COMMUNITY)
Admission: EM | Disposition: A | Discharge status: Home / Self Care | Payer: Self-pay | Source: Home / Self Care | Attending: Internal Medicine

## 2016-12-20 ENCOUNTER — Inpatient Hospital Stay (HOSPITAL_COMMUNITY): Payer: Medicare HMO | Admitting: Anesthesiology

## 2016-12-20 ENCOUNTER — Encounter (HOSPITAL_COMMUNITY): Payer: Self-pay | Admitting: Certified Registered"

## 2016-12-20 DIAGNOSIS — R55 Syncope and collapse: Secondary | ICD-10-CM

## 2016-12-20 DIAGNOSIS — S72001A Fracture of unspecified part of neck of right femur, initial encounter for closed fracture: Secondary | ICD-10-CM

## 2016-12-20 HISTORY — PX: HIP ARTHROPLASTY: SHX981

## 2016-12-20 LAB — BASIC METABOLIC PANEL
ANION GAP: 8 (ref 5–15)
BUN: 14 mg/dL (ref 6–20)
CHLORIDE: 103 mmol/L (ref 101–111)
CO2: 24 mmol/L (ref 22–32)
Calcium: 8.5 mg/dL — ABNORMAL LOW (ref 8.9–10.3)
Creatinine, Ser: 0.88 mg/dL (ref 0.61–1.24)
GFR calc non Af Amer: >60 mL/min (ref >60)
GLUCOSE: 116 mg/dL — AB (ref 65–99)
POTASSIUM: 3.9 mmol/L (ref 3.5–5.1)
Sodium: 135 mmol/L (ref 135–145)

## 2016-12-20 LAB — CBC
HEMATOCRIT: 36.2 % — AB (ref 39.0–52.0)
HEMOGLOBIN: 12.2 g/dL — AB (ref 13.0–17.0)
MCH: 31.4 pg (ref 26.0–34.0)
MCHC: 33.7 g/dL (ref 30.0–36.0)
MCV: 93.3 fL (ref 78.0–100.0)
Platelets: 148 10*3/uL — ABNORMAL LOW (ref 150–400)
RBC: 3.88 MIL/uL — AB (ref 4.22–5.81)
RDW: 14.2 % (ref 11.5–15.5)
WBC: 7.4 10*3/uL (ref 4.0–10.5)

## 2016-12-20 LAB — ECHOCARDIOGRAM COMPLETE
Height: 72 in
Weight: 2320 oz

## 2016-12-20 LAB — SURGICAL PCR SCREEN
MRSA, PCR: POSITIVE — AB
STAPHYLOCOCCUS AUREUS: POSITIVE — AB

## 2016-12-20 SURGERY — HEMIARTHROPLASTY, HIP, DIRECT ANTERIOR APPROACH, FOR FRACTURE
Anesthesia: General | Site: Hip | Laterality: Right

## 2016-12-20 MED ORDER — LACTATED RINGERS IV SOLN
INTRAVENOUS | Status: DC
  Administered 2016-12-20 – 2016-12-22 (×2): via INTRAVENOUS

## 2016-12-20 MED ORDER — OXYCODONE HCL 5 MG PO TABS
5.0000 mg | ORAL_TABLET | ORAL | Status: DC | PRN
  Administered 2016-12-20 – 2016-12-23 (×12): 10 mg via ORAL
  Filled 2016-12-20 (×13): qty 2

## 2016-12-20 MED ORDER — MUPIROCIN 2 % EX OINT
1.0000 "application " | TOPICAL_OINTMENT | Freq: Two times a day (BID) | CUTANEOUS | Status: DC
  Administered 2016-12-20 – 2016-12-23 (×7): 1 via NASAL
  Filled 2016-12-20: qty 22

## 2016-12-20 MED ORDER — METOCLOPRAMIDE HCL 5 MG PO TABS
5.0000 mg | ORAL_TABLET | Freq: Three times a day (TID) | ORAL | Status: DC | PRN
  Administered 2016-12-23: 10 mg via ORAL
  Filled 2016-12-20: qty 2

## 2016-12-20 MED ORDER — SODIUM CHLORIDE 0.9 % IR SOLN
Status: DC | PRN
  Administered 2016-12-20: 3000 mL

## 2016-12-20 MED ORDER — ONDANSETRON HCL 4 MG/2ML IJ SOLN: 4.0000 mg | Freq: Four times a day (QID) | INTRAMUSCULAR | Status: DC | PRN

## 2016-12-20 MED ORDER — PHENYLEPHRINE 40 MCG/ML (10ML) SYRINGE FOR IV PUSH (FOR BLOOD PRESSURE SUPPORT)
PREFILLED_SYRINGE | INTRAVENOUS | Status: DC | PRN
  Administered 2016-12-20: 80 ug via INTRAVENOUS

## 2016-12-20 MED ORDER — ROCURONIUM BROMIDE 10 MG/ML (PF) SYRINGE
PREFILLED_SYRINGE | INTRAVENOUS | Status: DC | PRN
  Administered 2016-12-20: 50 mg via INTRAVENOUS

## 2016-12-20 MED ORDER — CEFAZOLIN SODIUM-DEXTROSE 2-4 GM/100ML-% IV SOLN
2.0000 g | Freq: Four times a day (QID) | INTRAVENOUS | Status: AC
  Administered 2016-12-20 (×2): 2 g via INTRAVENOUS
  Filled 2016-12-20 (×2): qty 100

## 2016-12-20 MED ORDER — BUPIVACAINE HCL 0.25 % IJ SOLN
INTRAMUSCULAR | Status: DC | PRN
  Administered 2016-12-20: 10 mL

## 2016-12-20 MED ORDER — MORPHINE SULFATE (PF) 4 MG/ML IV SOLN
1.0000 mg | Freq: Once | INTRAVENOUS | Status: AC | PRN
  Administered 2016-12-20: 1 mg via INTRAVENOUS
  Filled 2016-12-20: qty 1

## 2016-12-20 MED ORDER — OXYCODONE HCL 5 MG PO TABS: 5.0000 mg | ORAL_TABLET | Freq: Once | ORAL | Status: DC | PRN

## 2016-12-20 MED ORDER — ONDANSETRON HCL 4 MG PO TABS: 4.0000 mg | ORAL_TABLET | Freq: Four times a day (QID) | ORAL | Status: DC | PRN

## 2016-12-20 MED ORDER — SODIUM CHLORIDE 0.9 % IV SOLN
1000.0000 mg | INTRAVENOUS | Status: DC
  Filled 2016-12-20: qty 10

## 2016-12-20 MED ORDER — PROPOFOL 10 MG/ML IV BOLUS
INTRAVENOUS | Status: AC
  Filled 2016-12-20: qty 20

## 2016-12-20 MED ORDER — METOCLOPRAMIDE HCL 5 MG/ML IJ SOLN
5.0000 mg | Freq: Three times a day (TID) | INTRAMUSCULAR | Status: DC | PRN
  Administered 2016-12-21: 10 mg via INTRAVENOUS
  Filled 2016-12-20 (×2): qty 2

## 2016-12-20 MED ORDER — DEXAMETHASONE SODIUM PHOSPHATE 10 MG/ML IJ SOLN
INTRAMUSCULAR | Status: DC | PRN
  Administered 2016-12-20: 10 mg via INTRAVENOUS

## 2016-12-20 MED ORDER — FENTANYL CITRATE (PF) 250 MCG/5ML IJ SOLN
INTRAMUSCULAR | Status: AC
  Filled 2016-12-20: qty 5

## 2016-12-20 MED ORDER — LACTATED RINGERS IV SOLN
INTRAVENOUS | Status: DC | PRN
  Administered 2016-12-20: 07:00:00 via INTRAVENOUS

## 2016-12-20 MED ORDER — ENOXAPARIN SODIUM 40 MG/0.4ML ~~LOC~~ SOLN
40.0000 mg | SUBCUTANEOUS | Status: DC
  Administered 2016-12-21 – 2016-12-23 (×3): 40 mg via SUBCUTANEOUS
  Filled 2016-12-20 (×3): qty 0.4

## 2016-12-20 MED ORDER — OXYCODONE HCL 5 MG PO TABS
ORAL_TABLET | ORAL | Status: AC
  Filled 2016-12-20: qty 1

## 2016-12-20 MED ORDER — MENTHOL 3 MG MT LOZG: 1.0000 | LOZENGE | OROMUCOSAL | Status: DC | PRN

## 2016-12-20 MED ORDER — PROPOFOL 10 MG/ML IV BOLUS
INTRAVENOUS | Status: DC | PRN
  Administered 2016-12-20: 40 mg via INTRAVENOUS
  Administered 2016-12-20: 50 mg via INTRAVENOUS
  Administered 2016-12-20: 100 mg via INTRAVENOUS

## 2016-12-20 MED ORDER — PHENYLEPHRINE HCL 10 MG/ML IJ SOLN
INTRAMUSCULAR | Status: DC | PRN
  Administered 2016-12-20: 10 ug/min via INTRAVENOUS

## 2016-12-20 MED ORDER — PHENOL 1.4 % MT LIQD: 1.0000 | OROMUCOSAL | Status: DC | PRN

## 2016-12-20 MED ORDER — HYDROMORPHONE HCL 1 MG/ML IJ SOLN
0.2500 mg | INTRAMUSCULAR | Status: DC | PRN
  Administered 2016-12-20 (×2): 0.25 mg via INTRAVENOUS

## 2016-12-20 MED ORDER — FENTANYL CITRATE (PF) 100 MCG/2ML IJ SOLN
INTRAMUSCULAR | Status: DC | PRN
  Administered 2016-12-20: 100 ug via INTRAVENOUS
  Administered 2016-12-20: 50 ug via INTRAVENOUS

## 2016-12-20 MED ORDER — CHLORHEXIDINE GLUCONATE CLOTH 2 % EX PADS
6.0000 | MEDICATED_PAD | Freq: Every day | CUTANEOUS | Status: DC
  Administered 2016-12-20 – 2016-12-23 (×4): 6 via TOPICAL

## 2016-12-20 MED ORDER — IPRATROPIUM-ALBUTEROL 0.5-2.5 (3) MG/3ML IN SOLN
3.0000 mL | Freq: Two times a day (BID) | RESPIRATORY_TRACT | Status: DC
  Administered 2016-12-20 – 2016-12-21 (×3): 3 mL via RESPIRATORY_TRACT
  Filled 2016-12-20 (×3): qty 3

## 2016-12-20 MED ORDER — CHLORHEXIDINE GLUCONATE 4 % EX LIQD
60.0000 mL | Freq: Once | CUTANEOUS | Status: AC
  Administered 2016-12-20: 4 via TOPICAL

## 2016-12-20 MED ORDER — CELECOXIB 200 MG PO CAPS
200.0000 mg | ORAL_CAPSULE | Freq: Every day | ORAL | Status: DC
  Administered 2016-12-20 – 2016-12-23 (×4): 200 mg via ORAL
  Filled 2016-12-20 (×4): qty 1

## 2016-12-20 MED ORDER — ONDANSETRON HCL 4 MG/2ML IJ SOLN
INTRAMUSCULAR | Status: DC | PRN
  Administered 2016-12-20: 4 mg via INTRAVENOUS

## 2016-12-20 MED ORDER — PROMETHAZINE HCL 25 MG/ML IJ SOLN: 6.2500 mg | INTRAMUSCULAR | Status: DC | PRN

## 2016-12-20 MED ORDER — LIDOCAINE 2% (20 MG/ML) 5 ML SYRINGE
INTRAMUSCULAR | Status: DC | PRN
  Administered 2016-12-20 (×2): 60 mg via INTRAVENOUS

## 2016-12-20 MED ORDER — OXYCODONE HCL 5 MG/5ML PO SOLN: 5.0000 mg | Freq: Once | ORAL | Status: DC | PRN

## 2016-12-20 MED ORDER — MORPHINE SULFATE (PF) 4 MG/ML IV SOLN
0.5000 mg | INTRAVENOUS | Status: DC | PRN
  Administered 2016-12-20 – 2016-12-21 (×3): 0.52 mg via INTRAVENOUS
  Filled 2016-12-20 (×3): qty 1

## 2016-12-20 MED ORDER — BUPIVACAINE HCL (PF) 0.25 % IJ SOLN
INTRAMUSCULAR | Status: AC
Start: 2016-12-20 — End: 2016-12-20
  Filled 2016-12-20: qty 30

## 2016-12-20 MED ORDER — CEFAZOLIN SODIUM-DEXTROSE 2-4 GM/100ML-% IV SOLN
2.0000 g | INTRAVENOUS | Status: AC
  Administered 2016-12-20: 2 g via INTRAVENOUS
  Filled 2016-12-20: qty 100

## 2016-12-20 MED ORDER — ACETAMINOPHEN 500 MG PO TABS
1000.0000 mg | ORAL_TABLET | Freq: Three times a day (TID) | ORAL | Status: DC
  Administered 2016-12-20 – 2016-12-23 (×5): 1000 mg via ORAL
  Filled 2016-12-20 (×10): qty 2

## 2016-12-20 MED ORDER — HYDROMORPHONE HCL 1 MG/ML IJ SOLN
INTRAMUSCULAR | Status: AC
  Administered 2016-12-20: 0.25 mg via INTRAVENOUS
  Filled 2016-12-20: qty 1

## 2016-12-20 MED ORDER — SUGAMMADEX SODIUM 200 MG/2ML IV SOLN
INTRAVENOUS | Status: DC | PRN
  Administered 2016-12-20: 120 mg via INTRAVENOUS

## 2016-12-20 MED ORDER — 0.9 % SODIUM CHLORIDE (POUR BTL) OPTIME
TOPICAL | Status: DC | PRN
  Administered 2016-12-20: 1000 mL

## 2016-12-20 SURGICAL SUPPLY — 52 items
BIT DRILL 7/64X5 DISP (BIT) ×2 IMPLANT
BLADE SAGITTAL 25.0X1.27X90 (BLADE) ×2 IMPLANT
CAPT HIP HEMI 2B ×2 IMPLANT
CHLORAPREP W/TINT 26ML (MISCELLANEOUS) ×4 IMPLANT
CLSR STERI-STRIP ANTIMIC 1/2X4 (GAUZE/BANDAGES/DRESSINGS) ×2 IMPLANT
COVER SURGICAL LIGHT HANDLE (MISCELLANEOUS) ×2 IMPLANT
DRAPE ORTHO SPLIT 77X108 STRL (DRAPES) ×2
DRAPE SURG ORHT 6 SPLT 77X108 (DRAPES) ×2 IMPLANT
DRAPE U-SHAPE 47X51 STRL (DRAPES) ×2 IMPLANT
DRSG AQUACEL AG ADV 3.5X10 (GAUZE/BANDAGES/DRESSINGS) ×2 IMPLANT
DRSG MEPILEX BORDER 4X8 (GAUZE/BANDAGES/DRESSINGS) ×2 IMPLANT
ELECT BLADE 4.0 EZ CLEAN MEGAD (MISCELLANEOUS) ×2
ELECT CAUTERY BLADE 6.4 (BLADE) ×2 IMPLANT
ELECT REM PT RETURN 9FT ADLT (ELECTROSURGICAL) ×2
ELECTRODE BLDE 4.0 EZ CLN MEGD (MISCELLANEOUS) ×1 IMPLANT
ELECTRODE REM PT RTRN 9FT ADLT (ELECTROSURGICAL) ×1 IMPLANT
GLOVE BIO SURGEON STRL SZ8 (GLOVE) ×4 IMPLANT
GLOVE BIOGEL PI IND STRL 8 (GLOVE) ×1 IMPLANT
GLOVE BIOGEL PI INDICATOR 8 (GLOVE) ×1
GOWN STRL REUS W/ TWL LRG LVL3 (GOWN DISPOSABLE) ×1 IMPLANT
GOWN STRL REUS W/ TWL XL LVL3 (GOWN DISPOSABLE) ×2 IMPLANT
GOWN STRL REUS W/TWL LRG LVL3 (GOWN DISPOSABLE) ×1
GOWN STRL REUS W/TWL XL LVL3 (GOWN DISPOSABLE) ×2
HIP CAPITATED HEMI 2B ×1 IMPLANT
KIT BASIN OR (CUSTOM PROCEDURE TRAY) ×2 IMPLANT
KIT ROOM TURNOVER OR (KITS) ×2 IMPLANT
MANIFOLD NEPTUNE II (INSTRUMENTS) ×2 IMPLANT
NEEDLE HYPO 25GX1X1/2 BEV (NEEDLE) ×2 IMPLANT
NEEDLE MAYO TROCAR (NEEDLE) ×2 IMPLANT
PACK TOTAL JOINT (CUSTOM PROCEDURE TRAY) ×2 IMPLANT
PAD ARMBOARD 7.5X6 YLW CONV (MISCELLANEOUS) ×8 IMPLANT
PILLOW ABDUCTION HIP (SOFTGOODS) ×2 IMPLANT
RETRIEVER SUT HEWSON (MISCELLANEOUS) ×2 IMPLANT
STAPLER VISISTAT 35W (STAPLE) ×2 IMPLANT
STRIP CLOSURE SKIN 1/2X4 (GAUZE/BANDAGES/DRESSINGS) ×2 IMPLANT
SUT ETHIBOND 2 V 37 (SUTURE) IMPLANT
SUT ETHIBOND 5 LR DA (SUTURE) IMPLANT
SUT FIBERWIRE #2 26 STR GREEN (SUTURE) ×2
SUT FIBERWIRE #5 38 CONV NDL (SUTURE) ×10
SUT MON AB 3-0 SH 27 (SUTURE)
SUT MON AB 3-0 SH27 (SUTURE) IMPLANT
SUT VIC AB 0 CT1 27 (SUTURE) ×1
SUT VIC AB 0 CT1 27XBRD ANBCTR (SUTURE) ×1 IMPLANT
SUT VIC AB 1 CT1 27 (SUTURE) ×1
SUT VIC AB 1 CT1 27XBRD ANBCTR (SUTURE) ×1 IMPLANT
SUT VIC AB 2-0 CT1 27 (SUTURE)
SUT VIC AB 2-0 CT1 TAPERPNT 27 (SUTURE) IMPLANT
SUT VIC AB 3-0 SH 18 (SUTURE) ×2 IMPLANT
SUTURE FIBERWR #2 26 STR GREEN (SUTURE) ×1 IMPLANT
SUTURE FIBERWR #5 38 CONV NDL (SUTURE) ×5 IMPLANT
SYR CONTROL 10ML LL (SYRINGE) ×2 IMPLANT
TRAY FOLEY CATH SILVER 14FR (SET/KITS/TRAYS/PACK) IMPLANT

## 2016-12-20 NOTE — Interval H&P Note (Signed)
History and Physical Interval Note:  12/20/2016 7:06 AM  Bradley [name]  has presented today for surgery, with the diagnosis of Right femoral neck fracture  The various methods of treatment have been discussed with the patient and family. After consideration of risks, benefits and other options for treatment, the patient has consented to  Procedure(s): ARTHROPLASTY BIPOLAR HIP (HEMIARTHROPLASTY) (Right) as a surgical intervention .  The patient's history has been reviewed, patient examined, no change in status, stable for surgery.  I have reviewed the patient's chart and labs.  Questions were answered to the patient's satisfaction.     [name]

## 2016-12-20 NOTE — Plan of Care (Signed)
  Clinical Measurements: Ability to maintain clinical measurements within normal limits will improve 12/20/2016 1144 - Progressing by Williams Che, RN   Nutrition: Adequate nutrition will be maintained 12/20/2016 1144 - Progressing by Williams Che, RN   Coping: Level of anxiety will decrease 12/20/2016 1144 - Progressing by Williams Che, RN   Pain Managment: General experience of comfort will improve 12/20/2016 1144 - Progressing by Williams Che, RN   Safety: Ability to remain free from injury will improve 12/20/2016 1144 - Progressing by Williams Che, RN

## 2016-12-20 NOTE — Progress Notes (Signed)
  Echocardiogram 2D Echocardiogram has been performed.  Johny Chess 12/20/2016, 1:52 PM

## 2016-12-20 NOTE — Progress Notes (Signed)
Patient taken off unit via bed and transport to OR all preop orders completed.

## 2016-12-20 NOTE — Op Note (Signed)
Orthopaedic Surgery Operative Note (CSN: [id number])  [name]  11/02/1940 Date of Surgery: 12/19/2016 - 12/20/2016   Diagnoses:  Right femoral neck fracture  Procedure:    ARTHROPLASTY BIPOLAR HIP (HEMIARTHROPLASTY) CPT(R) Code:  94174 - PR PARTIAL HIP REPLACEMENT    Operative Finding Successful completion of planned procedure.  Stable to 90 flexion, 70 IR, no shuck.  Press fit appropriate, great capsular and Ex rotator closure  Post-operative plan: The patient will be WBAT.  The patient will be admitted to medicine.  DVT prophylaxis Lovenox 40mg  qd POD1.  Pain control with PRN pain medication preferring oral medicines.  Follow up plan will be scheduled in approximately 10-14 days for wound check.  Posterior hip precautions.  Post-Op Diagnosis: Same Surgeons:Primary: Bradley Gash, MD Assistants:Brandon Leslee Home OPA Location: [facility] OR ROOM 05 Anesthesia: General Antibiotics: Ancef 2g preop Tourniquet time: None Estimated Blood Loss: 081 Complications: None Specimens: None Implants: Implant Name Type Inv. Item Serial No. Manufacturer Lot No. LRB No. Used Action  SLEEVE UNITRAX V40 - [id number] Orthopedic Implant SLEEVE UNITRAX V40  [employer] [id number] Right 1 Implanted  HEAD MODULAR ENDO - [id number] Orthopedic Implant HEAD MODULAR ENDO  [employer] [id number] Right 1 Implanted  accolade 2  132 degree angle hip stem Stem   [employer] [id number] Right 1 Implanted    Indications for Surgery:   Bradley [name] is a 76 y.o. male with fall resulting in displaced comminuted femoral neck fracture on Right.  Benefits and risks of operative and nonoperative management were discussed prior to surgery with patient/guardian(s) and informed consent form was completed.  Specific risks including infection, need for additional surgery, dislocation, pain.   Procedure:   The patient was identified in the preoperative holding area where the surgical site was marked. The patient  was taken to the OR where a procedural timeout was called and the above noted anesthesia was induced.  The patient was positioned lateral with Mark 2 positioner.  Preoperative antibiotics were dosed.  The patient's right hip was prepped and draped in the usual sterile fashion.  A second preoperative timeout was called.     We made an incision centered over the greater trochanter with a scalpel. We used the scalpel to continue to dissect to the fascia. The fascia was pierced with Bovie electrocautery. Mayo scissors were used to cut the fascia in a longitudinal fashion. The gluteus maximus fibers were bluntly split in line with their fibers. The femur was slowly internally rotated, putting tension on the posterior structures. Bovie electrocautery was used to dissect the short external rotators off of the insertion onto the femur. After the short external rotators were transected, we visualized the femoral neck fracture. We identified the sciatic nerve by palpation and verified that it was not in danger from dissection. We made a T-shaped capsulotomy. We made a neck cut approximately 1 cm above the lesser trochanter with a a reciprocating saw. We removed the femoral head. We used the box cutter to cut away some of the greater trochanter for ease of insertion of the stem. We then used the canal finder to locate the femoral canal. Using the angle of the femoral neck as our guide for version, we broached sequentially. We trialed components and found appropriate fit and stability.  The patient would come to full extension of the hip. The hip was stable at flexion 90, IR 70 with adduction.  We then removed the trials as well as the broach. We ensured there were no foreign  bodies or bone within the acetabulum. We copiously irrigated the wound.  We then carefully placed our stem, size listed above before assembling the head and neck together onto the stem. We then reduced the hip. Again, that was stable in the previously  mentioned manipulations. We repaired the posterior capsule. Short external rotators repaired to the greater trochanter. We closed the fascia of the iliotibial band and gluteus maximus with running and intterupted Vicryl sutures. We then closed Scarpa's fascia with running Vicryl sutures. Skin was closed with absobable suture.   The patient was awoken from general anesthesia and taken to the PACU in stable condition without complication.

## 2016-12-20 NOTE — Anesthesia Postprocedure Evaluation (Signed)
Anesthesia Post Note  Patient: Bradley [name]  Procedure(s) Performed: ARTHROPLASTY BIPOLAR HIP (HEMIARTHROPLASTY) (Right Hip)     Patient location during evaluation: PACU Anesthesia Type: General Level of consciousness: awake and alert Pain management: pain level controlled Vital Signs Assessment: post-procedure vital signs reviewed and stable Respiratory status: spontaneous breathing, nonlabored ventilation, respiratory function stable and patient connected to nasal cannula oxygen Cardiovascular status: blood pressure returned to baseline and stable Postop Assessment: no apparent nausea or vomiting Anesthetic complications: no    Last Vitals:  Vitals:   12/20/16 1948 12/20/16 2022  BP: 116/66   Pulse: 74   Resp:    Temp: 36.8 C   SpO2: 94% 94%    Last Pain:  Vitals:   12/20/16 2049  TempSrc:   PainSc: 7                  Emmauel Hallums P Lindsie Simar

## 2016-12-20 NOTE — Progress Notes (Signed)
PROGRESS NOTE    Bradley [name]  TMA:[id number] DOB: August 11, 1940 DOA: 12/19/2016 PCP: Jani Gravel, MD     Brief Narrative:  Bradley [name] is a 76 yo male with past medical history of COPD, hypertension, tobacco abuse who presented to the emergency department after a fall.  He states that he was at home, was playing with his granddaughter when he tripped and fell onto his right hip.  He was able to crawl to the bed, but unable to stand or ambulate due to right hip pain.  EMS was called.  Workup revealed right hip fracture.  He was transferred to [facility] and underwent right hip hemiarthroplasty on 11/24 with Dr. Griffin Basil.  Assessment & Plan:   Principal Problem:   Rt Hip fracture ([place]) Active Problems:   Tobacco abuse   Emphysema, unspecified ([place])   CAD (coronary artery disease), native coronary artery   Essential hypertension   Leukocytosis   Right hip fracture after mechanical fall -S/p right hemiarthroplasty 11/24 with Dr. Griffin Basil -PT OT  -Pain control  -WBAT -DVT ppx lovenox 40mg  daily -Follow up in 10-14 days for wound check with Dr. Griffin Basil   COPD -Without acute exacerbation   Chronic systolic dysfunction CHF  -Without acute exacerbation  CAD -Continue aspirin, Toprol-XL and simvastatin   Essential HTN -Continue amlodipine, Toprol-XL, and lisinopril -BP better today   Tobacco Abuse -Encourage cessation  -Nicotine patch    DVT prophylaxis: lovenox Code Status: full Family Communication: no family at bedside Disposition Plan: pending PT OT    Consultants:   Orthopedic surgery   Procedures:   Right hip hemiarthroplasty 11/24 with Dr. Griffin Basil   Antimicrobials:  Anti-infectives (From admission, onward)   Start     Dose/Rate Route Frequency Ordered Stop   12/20/16 1030  ceFAZolin (ANCEF) IVPB 2g/100 mL premix     2 g 200 mL/hr over 30 Minutes Intravenous Every 6 hours 12/20/16 1021 12/20/16 2229   12/20/16 0645  ceFAZolin (ANCEF) IVPB  2g/100 mL premix     2 g 200 mL/hr over 30 Minutes Intravenous On call to O.R. 12/20/16 2563 12/20/16 0802       Subjective: Feeling well post-operatively. Pain well controlled, but starting to come back. No chest pain or shortness of breath. Ate lunch. No nausea, vomiting   Objective: Vitals:   12/20/16 0949 12/20/16 1000 12/20/16 1004 12/20/16 1300  BP: (!) 136/52  (!) 154/70 130/77  Pulse: 73 71 70 72  Resp: (!) 8 14 15 16   Temp:   (!) 97.3 F (36.3 C) 98 F (36.7 C)  TempSrc:    Oral  SpO2: 96% 93% 93% 98%  Weight:      Height:        Intake/Output Summary (Last 24 hours) at 12/20/2016 1437 Last data filed at 12/20/2016 0902 Gross per 24 hour  Intake 600 ml  Output 800 ml  Net -200 ml   Filed Weights   12/19/16 1245  Weight: 65.8 kg (145 lb)    Examination:  General exam: Appears calm and comfortable  Respiratory system: Clear to auscultation, diminished.  Lungs are hyperinflated. Respiratory effort normal. Cardiovascular system: S1 & S2 heard, RRR. No JVD, murmurs, rubs, gallops or clicks. No pedal edema. Gastrointestinal system: Abdomen is nondistended, soft and nontender. No organomegaly or masses felt. Normal bowel sounds heard. Central nervous system: Alert and oriented. No focal neurological deficits. Skin: No rashes, lesions or ulcers Psychiatry: Judgement and insight appear normal. Mood & affect appropriate.  Data Reviewed: I have personally reviewed following labs and imaging studies  CBC: Recent Labs  Lab 12/19/16 1332 12/20/16 1036  WBC 16.0* 7.4  NEUTROABS 11.5*  --   HGB 16.3 12.2*  HCT 50.1 36.2*  MCV 93.3 93.3  PLT 426* 315*   Basic Metabolic Panel: Recent Labs  Lab 12/19/16 1332 12/20/16 1036  NA 133* 135  K 4.0 3.9  CL 97* 103  CO2 23 24  GLUCOSE 107* 116*  BUN 15 14  CREATININE 0.87 0.88  CALCIUM 9.6 8.5*   GFR: Estimated Creatinine Clearance: 66.5 mL/min (by C-G formula based on SCr of 0.88 mg/dL). Liver Function  Tests: No results for input(s): AST, ALT, ALKPHOS, BILITOT, PROT, ALBUMIN in the last 168 hours. No results for input(s): LIPASE, AMYLASE in the last 168 hours. No results for input(s): AMMONIA in the last 168 hours. Coagulation Profile: Recent Labs  Lab 12/19/16 1332  INR 0.95   Cardiac Enzymes: Recent Labs  Lab 12/19/16 1332  TROPONINI <0.03   BNP (last 3 results) No results for input(s): PROBNP in the last 8760 hours. HbA1C: No results for input(s): HGBA1C in the last 72 hours. CBG: No results for input(s): GLUCAP in the last 168 hours. Lipid Profile: No results for input(s): CHOL, HDL, LDLCALC, TRIG, CHOLHDL, LDLDIRECT in the last 72 hours. Thyroid Function Tests: No results for input(s): TSH, T4TOTAL, FREET4, T3FREE, THYROIDAB in the last 72 hours. Anemia Panel: No results for input(s): VITAMINB12, FOLATE, FERRITIN, TIBC, IRON, RETICCTPCT in the last 72 hours. Sepsis Labs: No results for input(s): PROCALCITON, LATICACIDVEN in the last 168 hours.  Recent Results (from the past 240 hour(s))  Surgical PCR screen     Status: Abnormal   Collection Time: 12/19/16 10:42 AM  Result Value Ref Range Status   MRSA, PCR POSITIVE (A) NEGATIVE Final   Staphylococcus aureus POSITIVE (A) NEGATIVE Final    Comment: RESULT CALLED TO, READ BACK BY AND VERIFIED WITH: Waylan Rocher AT 4008 12/20/16 BY L BENFIELD (NOTE) The Xpert SA Assay (FDA approved for NASAL specimens in patients 71 years of age and older), is one component of a comprehensive surveillance program. It is not intended to diagnose infection nor to guide or monitor treatment.   Culture, blood (Routine X 2) w Reflex to ID Panel     Status: None (Preliminary result)   Collection Time: 12/19/16  4:38 PM  Result Value Ref Range Status   Specimen Description LEFT ANTECUBITAL  Final   Special Requests   Final    BOTTLES DRAWN AEROBIC AND ANAEROBIC Blood Culture adequate volume   Culture NO GROWTH < 24 HOURS  Final   Report  Status PENDING  Incomplete  Culture, blood (Routine X 2) w Reflex to ID Panel     Status: None (Preliminary result)   Collection Time: 12/19/16  4:38 PM  Result Value Ref Range Status   Specimen Description BLOOD LEFT HAND  Final   Special Requests   Final    BOTTLES DRAWN AEROBIC AND ANAEROBIC Blood Culture adequate volume   Culture NO GROWTH < 24 HOURS  Final   Report Status PENDING  Incomplete       Radiology Studies: Dg Chest 1 View  Result Date: 12/19/2016 CLINICAL DATA:  Fall this morning.  Hip fracture. EXAM: CHEST 1 VIEW COMPARISON:  Chest x-ray dated 09/11/2016. FINDINGS: Heart size and mediastinal contours are normal. Atherosclerotic changes noted at the aortic arch. Coarse lung markings again noted bilaterally indicating chronic interstitial lung disease/  fibrosis. No confluent opacity to suggest a developing pneumonia. No pleural effusion or pneumothorax seen. No acute or suspicious osseous finding. IMPRESSION: 1. No active disease.  No evidence of pneumonia or pulmonary edema. 2. Chronic interstitial lung disease/fibrosis. 3. Aortic atherosclerosis. Electronically Signed   By: Franki Cabot M.D.   On: 12/19/2016 13:30   Pelvis Portable  Result Date: 12/20/2016 CLINICAL DATA:  Postop right hip replacement. EXAM: PORTABLE PELVIS 1-2 VIEWS COMPARISON:  12/19/2016 FINDINGS: Sequelae of interval right hip bipolar hemiarthroplasty are identified with postoperative gas in the surrounding soft tissues. There is no evidence of acute fracture or dislocation on this single AP image. Small calcifications in the pelvis likely represent phleboliths. IMPRESSION: Interval right hip hemiarthroplasty without evidence of acute complication. Electronically Signed   By: Logan Bores M.D.   On: 12/20/2016 10:20   Dg Hip Unilat W Or Wo Pelvis 2-3 Views Right  Result Date: 12/19/2016 CLINICAL DATA:  Right hip pain after fall. EXAM: DG HIP (WITH OR WITHOUT PELVIS) 2-3V RIGHT COMPARISON:  None.  FINDINGS: Moderately displaced and possibly comminuted fracture is seen involving the proximal right femoral neck. Left hip appears normal. IMPRESSION: Moderately displaced and possibly comminuted proximal right femoral neck fracture. Electronically Signed   By: Marijo Conception, M.D.   On: 12/19/2016 13:30      Scheduled Meds: . acetaminophen  1,000 mg Oral Q8H  . amLODipine  10 mg Oral Daily  . aspirin EC  81 mg Oral Daily  . celecoxib  200 mg Oral Daily  . Chlorhexidine Gluconate Cloth  6 each Topical Q0600  . [START ON 12/21/2016] enoxaparin (LOVENOX) injection  40 mg Subcutaneous Q24H  . ferrous sulfate  325 mg Oral Q breakfast  . folic acid  1 mg Oral Daily  . gabapentin  300 mg Oral TID  . guaiFENesin  600 mg Oral BID  . ipratropium-albuterol  3 mL Nebulization BID  . lisinopril  20 mg Oral Daily  . metoCLOPramide  5 mg Oral TID AC & HS  . metoprolol succinate  25 mg Oral Daily  . mometasone-formoterol  2 puff Inhalation BID  . multivitamin with minerals  1 tablet Oral Daily  . mupirocin ointment  1 application Nasal BID  . nicotine  21 mg Transdermal Daily  . pantoprazole  40 mg Oral Daily  . senna  1 tablet Oral BID  . simvastatin  10 mg Oral Daily  . sodium chloride flush  3 mL Intravenous Q12H  . thiamine  100 mg Oral Daily   Continuous Infusions: . sodium chloride    .  ceFAZolin (ANCEF) IV 2 g (12/20/16 1208)  . lactated ringers 10 mL/hr at 12/20/16 0651     LOS: 1 day    Time spent: 40 minutes   Dessa Phi, DO [facility] [email or web address] Password [facility] 12/20/2016, 2:37 PM

## 2016-12-20 NOTE — Care Management Note (Signed)
Case Management Note  Patient Details  Name: Bradley [name] MRN: [id number] Date of Birth: 22-Oct-1940  Subjective/Objective:              Pt from home with hip fracture and ORIF today.  Pt lives at home with daughter who is there for supervision 24/7.  Pt has cane but no other DME.  Pt states he has PMD Dr. Maudie Mercury in [place] and get medications from [facility] in [place] without problem.  Pt states he drives and daughter does not.  If he is unable to drive, son-in-law can drive.  Pt agreeable to [facility] PT/OT.     Action/Plan: List of [facility] agencies given for pt to consider.  Pt states neither he or his daughter read and will ask a neighbor to help them choose.  Will discuss needs further closer to d/c.CM will continue to follow.  Expected Discharge Date:                  Expected Discharge Plan:  [place]  In-House Referral:  NA  Discharge planning Services  CM Consult  Post Acute Care Choice:    Choice offered to:     DME Arranged:    DME Agency:     HH Arranged:    HH Agency:     Status of Service:  In process, will continue to follow  If discussed at Long Length of Stay Meetings, dates discussed:    Additional Comments:  Arley Phenix, RN 12/20/2016, 2:36 PM
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
## 2016-12-20 NOTE — H&P (View-Only) (Signed)
ORTHOPAEDIC CONSULTATION  REQUESTING PHYSICIAN: Dessa Phi, DO  Chief Complaint: R hip pain  HPI: Bradley [name] is a 76 y.o. male who complains of  Right hip pain after fall.  Patient is a Furniture conservator/restorer with device.  No LOC.    Patient was attempting to dodge his granchild at the time of the fall.  No other areas of pain.  No fevers, chills, no numbness or tingling in extremity.  Had a total knee on the Left but uneventful recovery.  Past Medical History:  Diagnosis Date  . Chronic lower back pain   . Contact with stonefish as cause of accidental injury 1954   "stung across my left hand in [place]; LUE weaker since"  . Coronary artery disease   . High cholesterol   . History of blood transfusion 1943  . Hypertension   . Migraine    "none since the 1990s" (09/11/2016)  . Myocardial infarction ([facility]) 01/20/1993; 02/28/1993  . Pneumonia    "several times" (09/11/2016)   Past Surgical History:  Procedure Laterality Date  . APPENDECTOMY    . CARDIAC CATHETERIZATION    . CATARACT EXTRACTION, BILATERAL Bilateral   . JOINT REPLACEMENT    . LEFT HEART CATH AND CORONARY ANGIOGRAPHY N/A 09/12/2016   Procedure: LEFT HEART CATH AND CORONARY ANGIOGRAPHY;  Surgeon: Burnell Blanks, MD;  Location: [facility];  Service: Cardiovascular;  Laterality: N/A;  . NASAL ENDOSCOPY WITH EPISTAXIS CONTROL Right 03/15/2016   Procedure: NASAL ENDOSCOPY WITH EPISTAXIS CONTROL;  Surgeon: Jodi Marble, MD;  Location: [facility];  Service: ENT;  Laterality: Right;  . SEPTOPLASTY Right 03/15/2016   Procedure: SEPTOPLASTY;  Surgeon: Jodi Marble, MD;  Location: [facility];  Service: ENT;  Laterality: Right;  . SINUS EXPLORATION Right 03/15/2016   Procedure: SINUS EXPLORATION;  Surgeon: Jodi Marble, MD;  Location: [facility];  Service: ENT;  Laterality: Right;  . TONSILLECTOMY    . TOOTH EXTRACTION    . TOTAL KNEE ARTHROPLASTY Left    Social History   Socioeconomic History  . Marital status:  Widowed    Spouse name: None  . Number of children: None  . Years of education: None  . Highest education level: None  Social Needs  . Financial resource strain: None  . Food insecurity - worry: None  . Food insecurity - inability: None  . Transportation needs - medical: None  . Transportation needs - non-medical: None  Occupational History  . None  Tobacco Use  . Smoking status: Current Every Day Smoker    Packs/day: 1.00    Years: 68.00    Pack years: 68.00    Types: Cigarettes  . Smokeless tobacco: Never Used  Substance and Sexual Activity  . Alcohol use: No    Comment: 09/11/2016 "drank from the age of 19 til 20"  . Drug use: No  . Sexual activity: Not Currently  Other Topics Concern  . None  Social History Narrative  . None   History reviewed. No pertinent family history. No Known Allergies Prior to Admission medications   Medication Sig Start Date End Date Taking? Authorizing Provider  ADVAIR HFA 115-21 MCG/ACT inhaler Inhale 2 puffs into the lungs every 12 (twelve) hours. 01/29/16  Yes [provider]  amLODipine (NORVASC) 5 MG tablet Take 1 tablet (5 mg total) by mouth daily. 09/13/16  Yes Dunn, Nedra Hai, PA-C  aspirin EC 81 MG tablet Take 1 tablet by mouth daily.   Yes [provider]  celecoxib (CELEBREX) 100 MG capsule Take 1 capsule by mouth 2 (two) times daily. 09/11/16  Yes [provider]  Ferrous Sulfate (IRON) 325 (65 Fe) MG TABS Take 1 tablet by mouth daily. 12/10/15  Yes [provider]  gabapentin (NEURONTIN) 300 MG capsule Take 1 capsule by mouth 3 (three) times daily. 08/15/16  Yes [provider]  lisinopril (PRINIVIL,ZESTRIL) 10 MG tablet Take 1 tablet (10 mg total) by mouth daily. 09/12/16  Yes Dunn, Dayna N, PA-C  metoCLOPramide (REGLAN) 5 MG tablet Take 1 tablet by mouth 4 (four) times daily -  before meals and at bedtime. 01/25/16  Yes [provider]  metoprolol succinate (TOPROL-XL) 25 MG 24 hr  tablet Take 1 tablet by mouth daily. 02/19/16  Yes [provider]  nitroGLYCERIN (NITROSTAT) 0.4 MG SL tablet Place 0.4 mg under the tongue every 5 (five) minutes as needed for chest pain.   Yes [provider]  omeprazole (PRILOSEC) 20 MG capsule Take 40 mg by mouth daily.    Yes [provider]  Oxycodone HCl 10 MG TABS Take 1 tablet by mouth 3 (three) times daily. 02/28/16  Yes [provider]  potassium chloride 20 MEQ TBCR Take 1 tablet (20 mEq) by mouth daily. 09/12/16  Yes Dunn, Dayna N, PA-C  simvastatin (ZOCOR) 10 MG tablet Take 10 mg by mouth daily. 12/23/15  Yes [provider]  VENTOLIN HFA 108 (90 Base) MCG/ACT inhaler Inhale 2 puffs into the lungs every 4 (four) hours as needed for wheezing or shortness of breath.  12/11/15   [provider]   Dg Chest 1 View  Result Date: 12/19/2016 CLINICAL DATA:  Fall this morning.  Hip fracture. EXAM: CHEST 1 VIEW COMPARISON:  Chest x-ray dated 09/11/2016. FINDINGS: Heart size and mediastinal contours are normal. Atherosclerotic changes noted at the aortic arch. Coarse lung markings again noted bilaterally indicating chronic interstitial lung disease/ fibrosis. No confluent opacity to suggest a developing pneumonia. No pleural effusion or pneumothorax seen. No acute or suspicious osseous finding. IMPRESSION: 1. No active disease.  No evidence of pneumonia or pulmonary edema. 2. Chronic interstitial lung disease/fibrosis. 3. Aortic atherosclerosis. Electronically Signed   By: Franki Cabot M.D.   On: 12/19/2016 13:30   Dg Hip Unilat W Or Wo Pelvis 2-3 Views Right  Result Date: 12/19/2016 CLINICAL DATA:  Right hip pain after fall. EXAM: DG HIP (WITH OR WITHOUT PELVIS) 2-3V RIGHT COMPARISON:  None. FINDINGS: Moderately displaced and possibly comminuted fracture is seen involving the proximal right femoral neck. Left hip appears normal. IMPRESSION: Moderately displaced and possibly comminuted proximal  right femoral neck fracture. Electronically Signed   By: Marijo Conception, M.D.   On: 12/19/2016 13:30   Family History Reviewed and non-contributory, no pertinent history of problems with bleeding or anesthesia      Review of Systems 14 system ROS conducted and negative except for that noted in HPI   OBJECTIVE  Vitals: Patient Vitals for the past 8 hrs:  BP Temp Temp src Pulse Resp SpO2  12/20/16 0457 (!) 137/45 98.2 F (36.8 C) Oral 82 13 92 %   General: Alert, no acute distress Cardiovascular: No pedal edema Respiratory: No cyanosis, no use of accessory musculature GI: No organomegaly, abdomen is soft and non-tender Skin: No lesions in the area of chief complaint other than those listed below in MSK exam.  Neurologic: sensation intact distally save for the below mentioned MSK exam Psychiatric: Patient is competent for consent with  normal mood and affect Lymphatic: No axillary or cervical lymphadenopathy Extremities   FBP:ZWCHENIDP and externally rotated.  ROM deferred. + GS/TA/EHL. Sensation intact in DP/SP/S/S/P distributions. 2+ DP pulse with warm and well perfused digits. Compartments soft and compressible, with no pain on passive stretch.  Rest of the extremities are without swelling, deformity, or effusion. Skin intact.  Nontender to palpation, with full and painless ROM throughout. Neurovascularly intact in all distributions. Compartments soft and compressible.     Test Results Imaging R hip demonstrates displaced femoral neck fracture  Labs cbc Recent Labs    12/19/16 1332  WBC 16.0*  HGB 16.3  HCT 50.1  PLT 426*    Labs inflam No results for input(s): CRP in the last 72 hours.  Invalid input(s): ESR  Labs coag Recent Labs    12/19/16 1332  INR 0.95    Recent Labs    12/19/16 1332  NA 133*  K 4.0  CL 97*  CO2 23  GLUCOSE 107*  BUN 15  CREATININE 0.87  CALCIUM 9.6     ASSESSMENT AND PLAN: 76 y.o. male with the following: R displaced  femoral neck fracture.    Recommend operative management with right hip hemiarthroplasty.  The risks benefits and alternatives were discussed with the patient including but not limited to the risks of nonoperative treatment, versus surgical intervention including infection, bleeding, nerve injury, periprosthetic fracture, the need for revision surgery, dislocation, leg length discrepancy, gait change, blood clots, cardiopulmonary complications, morbidity, mortality, among others, and they were willing to proceed.    - Weight Bearing Status/Activity: NWB  - Additional recommended labs/tests: None  -VTE Prophylaxis: Lovenox postop 53m qd  - Pain control: standing APAP + oxycodone  - Follow-up plan: 2 week followup postop  -Procedures: R hip hemiarthroplasty planned

## 2016-12-20 NOTE — Progress Notes (Signed)
Per Dr. Roanna Banning patient given incentitaive spirometer and used in short stay.

## 2016-12-20 NOTE — Transfer of Care (Signed)
Immediate Anesthesia Transfer of Care Note  Patient: Bradley [name]  Procedure(s) Performed: ARTHROPLASTY BIPOLAR HIP (HEMIARTHROPLASTY) (Right Hip)  Patient Location: PACU  Anesthesia Type:General  Level of Consciousness: drowsy  Airway & Oxygen Therapy: Patient Spontanous Breathing and Patient connected to nasal cannula oxygen  Post-op Assessment: Report given to RN and Post -op Vital signs reviewed and stable  Post vital signs: Reviewed and stable  Last Vitals:  Vitals:   12/20/16 0457 12/20/16 0919  BP: (!) 137/45 (!) 126/59  Pulse: 82 67  Resp: 13 19  Temp: 36.8 C 36.7 C  SpO2: 92% 97%    Last Pain:  Vitals:   12/20/16 0552  TempSrc:   PainSc: Asleep      Patients Stated Pain Goal: 3 (07/22/92 8546)  Complications: No apparent anesthesia complications

## 2016-12-20 NOTE — Anesthesia Procedure Notes (Signed)
Procedure Name: Intubation Date/Time: 12/20/2016 7:46 AM Performed by: Imagene Riches, CRNA Pre-anesthesia Checklist: Patient identified, Emergency Drugs available, Suction available and Patient being monitored Patient Re-evaluated:Patient Re-evaluated prior to induction Oxygen Delivery Method: Circle System Utilized Preoxygenation: Pre-oxygenation with 100% oxygen Induction Type: IV induction Ventilation: Mask ventilation without difficulty Laryngoscope Size: Miller and 2 Grade View: Grade I Tube type: Oral Tube size: 7.5 mm Number of attempts: 1 Airway Equipment and Method: Stylet and Oral airway Placement Confirmation: ETT inserted through vocal cords under direct vision,  positive ETCO2 and breath sounds checked- equal and bilateral Secured at: 23 cm Tube secured with: Tape Dental Injury: Teeth and Oropharynx as per pre-operative assessment

## 2016-12-21 LAB — BLOOD CULTURE ID PANEL (REFLEXED)
Acinetobacter baumannii: NOT DETECTED
CANDIDA ALBICANS: NOT DETECTED
Candida glabrata: NOT DETECTED
Candida krusei: NOT DETECTED
Candida parapsilosis: NOT DETECTED
Candida tropicalis: NOT DETECTED
ENTEROBACTERIACEAE SPECIES: NOT DETECTED
ENTEROCOCCUS SPECIES: NOT DETECTED
Enterobacter cloacae complex: NOT DETECTED
Escherichia coli: NOT DETECTED
HAEMOPHILUS INFLUENZAE: NOT DETECTED
KLEBSIELLA OXYTOCA: NOT DETECTED
Klebsiella pneumoniae: NOT DETECTED
LISTERIA MONOCYTOGENES: NOT DETECTED
NEISSERIA MENINGITIDIS: NOT DETECTED
PSEUDOMONAS AERUGINOSA: NOT DETECTED
Proteus species: NOT DETECTED
STAPHYLOCOCCUS AUREUS BCID: NOT DETECTED
STREPTOCOCCUS AGALACTIAE: NOT DETECTED
STREPTOCOCCUS PNEUMONIAE: NOT DETECTED
STREPTOCOCCUS PYOGENES: NOT DETECTED
STREPTOCOCCUS SPECIES: NOT DETECTED
Serratia marcescens: NOT DETECTED
Staphylococcus species: NOT DETECTED

## 2016-12-21 LAB — CBC
HCT: 38.2 % — ABNORMAL LOW (ref 39.0–52.0)
Hemoglobin: 12.5 g/dL — ABNORMAL LOW (ref 13.0–17.0)
MCH: 30.2 pg (ref 26.0–34.0)
MCHC: 32.7 g/dL (ref 30.0–36.0)
MCV: 92.3 fL (ref 78.0–100.0)
PLATELETS: 340 10*3/uL (ref 150–400)
RBC: 4.14 MIL/uL — AB (ref 4.22–5.81)
RDW: 15.8 % — ABNORMAL HIGH (ref 11.5–15.5)
WBC: 20 10*3/uL — AB (ref 4.0–10.5)

## 2016-12-21 LAB — BASIC METABOLIC PANEL
ANION GAP: 8 (ref 5–15)
BUN: 25 mg/dL — ABNORMAL HIGH (ref 6–20)
CO2: 24 mmol/L (ref 22–32)
Calcium: 8.4 mg/dL — ABNORMAL LOW (ref 8.9–10.3)
Chloride: 101 mmol/L (ref 101–111)
Creatinine, Ser: 1.18 mg/dL (ref 0.61–1.24)
GFR, EST NON AFRICAN AMERICAN: 58 mL/min — AB (ref >60)
Glucose, Bld: 127 mg/dL — ABNORMAL HIGH (ref 65–99)
POTASSIUM: 4.1 mmol/L (ref 3.5–5.1)
SODIUM: 133 mmol/L — AB (ref 135–145)

## 2016-12-21 MED ORDER — OXYCODONE HCL 5 MG PO TABS: ORAL_TABLET | ORAL | 0 refills | Status: AC

## 2016-12-21 MED ORDER — PROMETHAZINE HCL 25 MG/ML IJ SOLN
12.5000 mg | Freq: Four times a day (QID) | INTRAMUSCULAR | Status: AC | PRN
  Administered 2016-12-21 – 2016-12-23 (×2): 12.5 mg via INTRAVENOUS
  Filled 2016-12-21 (×2): qty 1

## 2016-12-21 MED ORDER — CELECOXIB 200 MG PO CAPS: 200.0000 mg | ORAL_CAPSULE | Freq: Every day | ORAL | 1 refills | Status: DC

## 2016-12-21 MED ORDER — ENOXAPARIN SODIUM 40 MG/0.4ML ~~LOC~~ SOLN: 40.0000 mg | SUBCUTANEOUS | 1 refills | Status: DC

## 2016-12-21 MED ORDER — ACETAMINOPHEN 500 MG PO TABS: 1000.0000 mg | ORAL_TABLET | Freq: Three times a day (TID) | ORAL | 0 refills | Status: AC

## 2016-12-21 NOTE — Progress Notes (Signed)
DME order for hospital bed, diagnosis of right hip fracture s/p right hemiarthroplasty 11/24, COPD, CHF, CAD. Above medical conditions require patient requires ability to reposition frequently. Head of bed elevated > 30 degrees. Bed type semi-electric.  Dessa Phi, DO [facility] 12/21/2016, 4:46 PM

## 2016-12-21 NOTE — Plan of Care (Signed)
  Nutrition: Adequate nutrition will be maintained 12/21/2016 0947 - Progressing by Williams Che, RN   Elimination: Will not experience complications related to bowel motility 12/21/2016 0947 - Progressing by Williams Che, RN   Pain Managment: General experience of comfort will improve 12/21/2016 0947 - Progressing by Williams Che, RN   Safety: Ability to remain free from injury will improve 12/21/2016 0947 - Progressing by Williams Che, RN   Skin Integrity: Risk for impaired skin integrity will decrease 12/21/2016 0947 - Progressing by Williams Che, RN

## 2016-12-21 NOTE — Progress Notes (Signed)
Pt stated that he requested to be discharged to SNF prior to going home since his daughter will not be able to take care him. Called and left message to case manager for follow-up.

## 2016-12-21 NOTE — Social Work (Signed)
CSW received call from RN on floor requesting CSW assistance.  CSW reviewed chart and patient has not been recommended for SNF.  RN should f/u with RN Case Manager for [facility] needs.  CSW will sign off for now as social work intervention is no longer needed. Please consult [place] again if new need arises.  Elissa Hefty, LCSW Clinical Social Worker [phone]

## 2016-12-21 NOTE — Care Management Note (Signed)
Case Management Note  Patient Details  Name: Bradley [name] MRN: [id number] Date of Birth: 02/11/1940  Subjective/Objective:  Received call from Bedside RN that pt was confused about his post hospitalization plans. Originally it was felt he needed SNF, he now ambulates 100'. CM ordered DME from [facility] and spoke to daughter who has chosen [facility] to provide HHPT/OT. They are moving and will be located at [place], [place]. She believes he will need hospital bed.  Not Ind pta.                  Action/Plan: CM will sign off for now but will be available should additional discharge needs arise or disposition change.    Expected Discharge Date:                  Expected Discharge Plan:  [facility] [place]  In-House Referral:  Clinical Social Work  Discharge planning Services  CM Consult  Post Acute Care Choice:  Durable Medical Equipment, Home Health Choice offered to:  Adult Children  DME Arranged:  3-N-1, Shower stool, Hospital bed, Walker rolling DME Agency:  [facility] Arranged:  PT, OT [facility] Agency:  [facility]  Status of Service:  In process, will continue to follow  If discussed at Long Length of Stay Meetings, dates discussed:    Additional Comments:  Delrae Sawyers, RN 12/21/2016, 2:51 PM

## 2016-12-21 NOTE — Progress Notes (Signed)
Physical Therapy Treatment Patient Details Name: Bradley [name] MRN: [id number] DOB: 1940/03/31 Today's Date: 12/21/2016    History of Present Illness 76 y.o. s/p Rt hip hemiarthroplasty. PMH includes CAD, HTN, migraine, MI, pneumonia, chronic lower back pain,     PT Comments    Patient progressing with mobility.  Did not do as well this pm, but feeling slightly nauseous.  Will continue PT.     Follow Up Recommendations  DC plan and follow up therapy as arranged by surgeon     Equipment Recommendations  Rolling walker with 5" wheels    Recommendations for Other Services       Precautions / Restrictions Precautions Precautions: Posterior Hip Precaution Comments: educated on hip precautions Required Braces or Orthoses: Other Brace/Splint Other Brace/Splint: hip abductor pillow    Mobility  Bed Mobility Overal bed mobility: Needs Assistance Bed Mobility: Sit to Supine;Supine to Sit     Supine to sit: Min assist Sit to supine: Min assist   General bed mobility comments: cueing for hip precautions, assistance for RLE to bed  Transfers Overall transfer level: Needs assistance Equipment used: Rolling walker (2 wheeled) Transfers: Sit to/from Stand Sit to Stand: Min guard         General transfer comment: cues for technique  Ambulation/Gait Ambulation/Gait assistance: Min guard Ambulation Distance (Feet): 50 Feet Assistive device: Rolling walker (2 wheeled) Gait Pattern/deviations: Step-through pattern;Antalgic;Decreased stance time - right Gait velocity: decreased       Stairs            Wheelchair Mobility    Modified Rankin (Stroke Patients Only)       Balance Overall balance assessment: Needs assistance Sitting-balance support: No upper extremity supported;Feet supported Sitting balance-Leahy Scale: Good     Standing balance support: Bilateral upper extremity supported;During functional activity   Standing balance comment: reliant on RW                              Cognition Arousal/Alertness: Awake/alert Behavior During Therapy: WFL for tasks assessed/performed Overall Cognitive Status: Within Functional Limits for tasks assessed                                        Exercises Total Joint Exercises Ankle Circles/Pumps: AROM;10 reps;Both;Supine Quad Sets: AROM;Both;Supine;10 reps Short Arc Quad: AROM;Right;Supine;10 reps Heel Slides: AAROM;Right;Supine;10 reps    General Comments        Pertinent Vitals/Pain Pain Assessment: 0-10 Pain Score: 4  Pain Location: left hip Pain Descriptors / Indicators: Aching;Discomfort;Sore Pain Intervention(s): Limited activity within patient's tolerance;Monitored during session    Home Living                      Prior Function            PT Goals (current goals can now be found in the care plan section) Progress towards PT goals: Progressing toward goals    Frequency    7X/week      PT Plan Current plan remains appropriate    Co-evaluation              AM-PAC PT "6 Clicks" Daily Activity  Outcome Measure  Difficulty turning over in bed (including adjusting bedclothes, sheets and blankets)?: A Little Difficulty moving from lying on back to sitting on the side of the bed? : A  Little Difficulty sitting down on and standing up from a chair with arms (e.g., wheelchair, bedside commode, etc,.)?: A Little Help needed moving to and from a bed to chair (including a wheelchair)?: A Little Help needed walking in hospital room?: A Little Help needed climbing 3-5 steps with a railing? : A Little 6 Click Score: 18    End of Session Equipment Utilized During Treatment: Gait belt Activity Tolerance: Patient tolerated treatment well Patient left: in bed;with call bell/phone within reach   PT Visit Diagnosis: Difficulty in walking, not elsewhere classified (R26.2)     Time: 3244-0102 PT Time Calculation (min) (ACUTE ONLY):  732 min  Charges:  $Gait Training: 8-22 mins                    G Codes:       2016/12/31 [place], PT [place], [place] 2016-12-31, 3:35 PM

## 2016-12-21 NOTE — Evaluation (Signed)
Physical Therapy Evaluation Patient Details Name: Bradley [name] MRN: [id number] DOB: Sep 18, 1940 Today's Date: 12/21/2016   History of Present Illness  76 y.o. s/p Rt hip hemiarthroplasty. PMH includes CAD, HTN, migraine, MI, pneumonia, chronic lower back pain,   Clinical Impression  Patient presents s/p THR with dependencies in gait and mobility.  Patient did well for first time up and anticipate he will progress steadily for anticipated d/c home on Monday.  Patient will benefit from PT to continue to progress mobility and gait.    Follow Up Recommendations DC plan and follow up therapy as arranged by surgeon    Equipment Recommendations  Rolling walker with 5" wheels    Recommendations for Other Services       Precautions / Restrictions Precautions Precautions: Posterior Hip Precaution Booklet Issued: No Precaution Comments: educated on hip precautions Required Braces or Orthoses: Other Brace/Splint Other Brace/Splint: hip abductor pillow Restrictions Weight Bearing Restrictions: Yes RLE Weight Bearing: Weight bearing as tolerated LLE Weight Bearing: Weight bearing as tolerated      Mobility  Bed Mobility Overal bed mobility: Needs Assistance Bed Mobility: Sit to Supine     Supine to sit: Supervision Sit to supine: Min assist   General bed mobility comments: cueing for hip precautions, assistance to raise RLE to bed  Transfers Overall transfer level: Needs assistance Equipment used: Rolling walker (2 wheeled) Transfers: Sit to/from Stand Sit to Stand: Supervision         General transfer comment: cues for technique  Ambulation/Gait Ambulation/Gait assistance: Supervision Ambulation Distance (Feet): 100 Feet Assistive device: Rolling walker (2 wheeled) Gait Pattern/deviations: Step-through pattern;Antalgic;Decreased stance time - right Gait velocity: decreased      Stairs            Wheelchair Mobility    Modified Rankin (Stroke Patients  Only)       Balance Overall balance assessment: Needs assistance Sitting-balance support: No upper extremity supported;Feet supported Sitting balance-Leahy Scale: Good     Standing balance support: Bilateral upper extremity supported;During functional activity Standing balance-Leahy Scale: Poor Standing balance comment: reliant on RW                              Pertinent Vitals/Pain Pain Assessment: 0-10 Pain Score: 7  Pain Location: left hip Pain Descriptors / Indicators: Aching;Discomfort;Sore Pain Intervention(s): Limited activity within patient's tolerance;Monitored during session;Patient requesting pain meds-RN notified    Home Living Family/patient expects to be discharged to:: Private residence Living Arrangements: Children Available Help at Discharge: Family;Available 24 hours/day Type of Home: House Home Access: Stairs to enter Entrance Stairs-Rails: None Entrance Stairs-Number of Steps: 1 Home Layout: One level Home Equipment: Cane - single point      Prior Function Level of Independence: Independent with assistive device(s)         Comments: used cane     Hand Dominance        Extremity/Trunk Assessment   Upper Extremity Assessment Upper Extremity Assessment: LUE deficits/detail LUE Deficits / Details: stung by fish and has residual issues with Lt hand LUE Coordination: decreased fine motor    Lower Extremity Assessment Lower Extremity Assessment: Overall WFL for tasks assessed(right hip ROM/strength not tested)    Cervical / Trunk Assessment Cervical / Trunk Assessment: Normal  Communication   Communication: No difficulties  Cognition Arousal/Alertness: Awake/alert Behavior During Therapy: WFL for tasks assessed/performed Overall Cognitive Status: Within Functional Limits for tasks assessed  General Comments      Exercises Total Joint Exercises Ankle Circles/Pumps:  AROM;10 reps;Both;Supine Quad Sets: AROM;Both;Supine;10 reps Short Arc Quad: AROM;Right;Supine;10 reps Hip ABduction/ADduction: AAROM;Right;10 reps;Supine Long Arc Quad: AROM;Right;Seated;10 reps   Assessment/Plan    PT Assessment Patient needs continued PT services  PT Problem List Decreased strength;Decreased activity tolerance;Decreased balance;Decreased mobility;Decreased knowledge of use of DME;Pain       PT Treatment Interventions DME instruction;Gait training;Stair training;Functional mobility training;Balance training;Therapeutic exercise;Therapeutic activities;Patient/family education    PT Goals (Current goals can be found in the Care Plan section)  Acute Rehab PT Goals Patient Stated Goal: get back home; drive by Dec 4 PT Goal Formulation: With patient Time For Goal Achievement: 12/28/16 Potential to Achieve Goals: Good    Frequency 7X/week   Barriers to discharge        Co-evaluation               AM-PAC PT "6 Clicks" Daily Activity  Outcome Measure Difficulty turning over in bed (including adjusting bedclothes, sheets and blankets)?: A Little Difficulty moving from lying on back to sitting on the side of the bed? : A Little Difficulty sitting down on and standing up from a chair with arms (e.g., wheelchair, bedside commode, etc,.)?: A Little Help needed moving to and from a bed to chair (including a wheelchair)?: A Little Help needed walking in hospital room?: A Little Help needed climbing 3-5 steps with a railing? : A Little 6 Click Score: 18    End of Session Equipment Utilized During Treatment: Gait belt Activity Tolerance: Patient tolerated treatment well Patient left: in bed;with call bell/phone within reach(hip abduction pillow in place) Nurse Communication: Mobility status PT Visit Diagnosis: Difficulty in walking, not elsewhere classified (R26.2)    Time: 1010-1028 PT Time Calculation (min) (ACUTE ONLY): 18 min   Charges:   PT  Evaluation $PT Eval Moderate Complexity: 1 Mod     PT G Codes:        Dec 22, 2016 [facility], PT [phone]    Shanna Cisco Dec 22, 2016, 10:38 AM

## 2016-12-21 NOTE — Progress Notes (Signed)
Got a call from Lab about patientt blood cultures drew on 12/19/16 at ED  tested positive for rod, attending doctor notify ,will continue to monitor.

## 2016-12-21 NOTE — Progress Notes (Signed)
PHARMACY - PHYSICIAN COMMUNICATION CRITICAL VALUE ALERT - BLOOD CULTURE IDENTIFICATION (BCID)  Results for orders placed or performed during the hospital encounter of 12/19/16  Blood Culture ID Panel (Reflexed) (Collected: 12/19/2016  4:38 PM)  Result Value Ref Range   Enterococcus species NOT DETECTED NOT DETECTED   Listeria monocytogenes NOT DETECTED NOT DETECTED   Staphylococcus species NOT DETECTED NOT DETECTED   Staphylococcus aureus NOT DETECTED NOT DETECTED   Streptococcus species NOT DETECTED NOT DETECTED   Streptococcus agalactiae NOT DETECTED NOT DETECTED   Streptococcus pneumoniae NOT DETECTED NOT DETECTED   Streptococcus pyogenes NOT DETECTED NOT DETECTED   Acinetobacter baumannii NOT DETECTED NOT DETECTED   Enterobacteriaceae species NOT DETECTED NOT DETECTED   Enterobacter cloacae complex NOT DETECTED NOT DETECTED   Escherichia coli NOT DETECTED NOT DETECTED   Klebsiella oxytoca NOT DETECTED NOT DETECTED   Klebsiella pneumoniae NOT DETECTED NOT DETECTED   Proteus species NOT DETECTED NOT DETECTED   Serratia marcescens NOT DETECTED NOT DETECTED   Haemophilus influenzae NOT DETECTED NOT DETECTED   Neisseria meningitidis NOT DETECTED NOT DETECTED   Pseudomonas aeruginosa NOT DETECTED NOT DETECTED   Candida albicans NOT DETECTED NOT DETECTED   Candida glabrata NOT DETECTED NOT DETECTED   Candida krusei NOT DETECTED NOT DETECTED   Candida parapsilosis NOT DETECTED NOT DETECTED   Candida tropicalis NOT DETECTED NOT DETECTED    Gm positive rods in the aerobic bottle of 1/2 blood cultures. BCID did not identify an organism.  Name of physician (or Provider) Contacted: J. Choi  Changes to prescribed antibiotics required: Currently not on antibiotics. Patient has been afebrile with no signs of infection. Will monitor off antibiotics.   Susa Raring, PharmD, BCPS PGY2 Infectious Diseases Pharmacy Resident Phone: Rhunette Croft [phone] 12/21/2016  9:23 AM

## 2016-12-21 NOTE — Evaluation (Signed)
Occupational Therapy Evaluation Patient Details Name: Bradley [name] MRN: [id number] DOB: December 22, 1940 Today's Date: 12/21/2016    History of Present Illness 76 y.o. s/p Rt hip hemiarthroplasty. PMH includes CAD, HTN, migraine, MI, pneumonia, chronic lower back pain,    Clinical Impression   Pt s/p above. Pt independent with ADLs, PTA. Feel pt will benefit from acute OT to increase independence prior to d/c.     Follow Up Recommendations  No OT follow up;Supervision - Intermittent    Equipment Recommendations  3 in 1 bedside commode;Other (comment);Tub/shower bench(AE and RW with two wheels)    Recommendations for Other Services       Precautions / Restrictions Precautions Precautions: Posterior Hip Precaution Booklet Issued: No Precaution Comments: educated on hip precautions Required Braces or Orthoses: Other Brace/Splint(hip abductor pillow) Restrictions Weight Bearing Restrictions: Yes RLE Weight Bearing: Weight bearing as tolerated      Mobility Bed Mobility Overal bed mobility: Needs Assistance Bed Mobility: Supine to Sit     Supine to sit: Supervision     General bed mobility comments: cues not to break hip precautions  Transfers Overall transfer level: Needs assistance Equipment used: Rolling walker (2 wheeled) Transfers: Sit to/from Stand Sit to Stand: Min guard         General transfer comment: cues for technique    Balance      Used RW for ambulation.                                      ADL either performed or assessed with clinical judgement   ADL Overall ADL's : Needs assistance/impaired                     Lower Body Dressing: Moderate assistance;Sit to/from stand   Toilet Transfer: Min guard;RW;Ambulation(sit to stand from bed)           Functional mobility during ADLs: Min guard;Rolling walker General ADL Comments: Mentioned that there is AE available for LB ADLs.      Vision         Perception      Praxis      Pertinent Vitals/Pain Pain Assessment: 0-10 Pain Score: 5  Pain Location: left hip Pain Descriptors / Indicators: Sore Pain Intervention(s): Monitored during session;Repositioned     Hand Dominance     Extremity/Trunk Assessment Upper Extremity Assessment Upper Extremity Assessment: LUE deficits/detail LUE Deficits / Details: stung by fish and has residual issues with Lt hand LUE Coordination: decreased fine motor   Lower Extremity Assessment Lower Extremity Assessment: Defer to PT evaluation       Communication Communication Communication: No difficulties   Cognition Arousal/Alertness: Awake/alert Behavior During Therapy: WFL for tasks assessed/performed Overall Cognitive Status: Within Functional Limits for tasks assessed                                     General Comments       Exercises     Shoulder Instructions      Home Living Family/patient expects to be discharged to:: Private residence Living Arrangements: Children Available Help at Discharge: Family;Available 24 hours/day Type of Home: House Home Access: Stairs to enter [employer] of Steps: 1 Entrance Stairs-Rails: None Home Layout: One level     Bathroom Shower/Tub: Tub only   Biochemist, clinical: Standard  Home Equipment: Cane - single point          Prior Functioning/Environment Level of Independence: Independent with assistive device(s)        Comments: used cane        OT Problem List: Decreased range of motion;Decreased knowledge of use of DME or AE;Decreased knowledge of precautions;Pain;Decreased coordination;Decreased strength      OT Treatment/Interventions: Self-care/ADL training;DME and/or AE instruction;Therapeutic activities;Patient/family education;Balance training    OT Goals(Current goals can be found in the care plan section) Acute Rehab OT Goals Patient Stated Goal: to drive OT Goal Formulation: With patient Time For  Goal Achievement: 12/28/16 Potential to Achieve Goals: Good ADL Goals Pt Will Perform Lower Body Dressing: with set-up;with supervision;with adaptive equipment;sit to/from stand Pt Will Transfer to Toilet: with supervision;ambulating;with set-up Pt Will Perform Toileting - Clothing Manipulation and hygiene: with supervision;sit to/from stand Pt Will Perform Tub/Shower Transfer: Tub transfer;ambulating;rolling walker;tub bench;with min guard assist Additional ADL Goal #1: Pt will independently verbalize 3/3 hip precautions.  OT Frequency: Min 2X/week   Barriers to D/C:            Co-evaluation              AM-PAC PT "6 Clicks" Daily Activity     Outcome Measure Help from another person eating meals?: None Help from another person taking care of personal grooming?: A Little Help from another person toileting, which includes using toliet, bedpan, or urinal?: A Little Help from another person bathing (including washing, rinsing, drying)?: A Little Help from another person to put on and taking off regular upper body clothing?: A Little Help from another person to put on and taking off regular lower body clothing?: A Lot 6 Click Score: 18   End of Session Equipment Utilized During Treatment: Gait belt;Rolling walker;Other (comment)(hip abductor pillow)  Activity Tolerance: Patient tolerated treatment well Patient left: with call bell/phone within reach;in chair  OT Visit Diagnosis: Pain Pain - Right/Left: Right Pain - part of body: Hip                Time: 1610-9604 OT Time Calculation (min): 19 min Charges:  OT General Charges $OT Visit: 1 Visit OT Evaluation $OT Eval Moderate Complexity: 1 Mod G-Codes:       Amariyon Maynes L Paige Vanderwoude OTR/L 12/21/2016, 9:48 AM

## 2016-12-21 NOTE — Progress Notes (Signed)
ORTHOPAEDIC PROGRESS NOTE  s/p Procedure(s): ARTHROPLASTY BIPOLAR HIP (HEMIARTHROPLASTY)  SUBJECTIVE: Reports mild pain about operative site. No chest pain. No SOB. No nausea/vomiting. No other complaints.  OBJECTIVE: PE:  EHM:[id number] CDI, abduction pillow in place, leg lengths equal, warm well perfused foot, intact EHL/TA/GSC   Vitals:   12/21/16 0840 12/21/16 0841  BP:    Pulse:    Resp:    Temp:    SpO2: 100% 100%     ASSESSMENT: Bradley [name] is a 76 y.o. male doing well postoperatively.  PLAN: Weightbearing: WBAT RLE posterior hip precautions Insicional and dressing care: OK to remove dressings 7 days postop and leave open to air with dry gauze PRN Orthopedic device(s):Abduction pillow while in bed until followup Showering: ok to shower with aquacel on. VTE prophylaxis: Lovenox 40mg  qd 2 weeks, transition to aspirin likely at that point. Pain control: Scheduled APAP, prn oxycodone Follow - up plan: 2 weeks with Ophelia Charter at [place] information:  EZMOQHUT 8-5 Ophelia Charter MD [phone], After hours and holidays please check [email or web address] for group call information for [facility]

## 2016-12-21 NOTE — Progress Notes (Signed)
PROGRESS NOTE    Bradley [name]  POE:[id number] DOB: 1940/10/30 DOA: 12/19/2016 PCP: Jani Gravel, MD     Brief Narrative:  Bradley [name] is a 77 yo male with past medical history of COPD, hypertension, tobacco abuse who presented to the emergency department after a fall.  He states that he was at home, was playing with his granddaughter when he tripped and fell onto his right hip.  He was able to crawl to the bed, but unable to stand or ambulate due to right hip pain.  EMS was called.  Workup revealed right hip fracture.  He was transferred to [facility] and underwent right hip hemiarthroplasty on 11/24 with Dr. Griffin Basil.  Assessment & Plan:   Principal Problem:   Rt Hip fracture ([place]) Active Problems:   Tobacco abuse   Emphysema, unspecified ([place])   CAD (coronary artery disease), native coronary artery   Essential hypertension   Leukocytosis   Right hip fracture after mechanical fall -S/p right hemiarthroplasty 11/24 with Dr. Griffin Basil -PT OT --> DC home  -Pain control  -WBAT -DVT ppx lovenox 40mg  daily -Follow up in 10-14 days for wound check with Dr. Griffin Basil   Leukocytosis -Likely reactive. No fever or signs of infection. Repeat CBC in AM  Gram positive rod bacteremia -BCID negative, second set blood culture negative to date -Likely contaminant  COPD -Without acute exacerbation   Chronic systolic dysfunction CHF  -Without acute exacerbation  CAD -Continue aspirin, Toprol-XL and simvastatin   Essential HTN -Continue amlodipine, Toprol-XL, and lisinopril -BP better today   Tobacco Abuse -Encourage cessation  -Nicotine patch    DVT prophylaxis: lovenox Code Status: full Family Communication: no family at bedside Disposition Plan: home tomorrow if stable    Consultants:   Orthopedic surgery   Procedures:   Right hip hemiarthroplasty 11/24 with Dr. Griffin Basil   Antimicrobials:  Anti-infectives (From admission, onward)   Start     Dose/Rate  Route Frequency Ordered Stop   12/20/16 1030  ceFAZolin (ANCEF) IVPB 2g/100 mL premix     2 g 200 mL/hr over 30 Minutes Intravenous Every 6 hours 12/20/16 1021 12/20/16 1659   12/20/16 0645  ceFAZolin (ANCEF) IVPB 2g/100 mL premix     2 g 200 mL/hr over 30 Minutes Intravenous On call to O.R. 12/20/16 3154 12/20/16 0802       Subjective: He has no acute complaints today.  Feeling well.  Denies chest pain or shortness of breath.  Pain is well controlled.  Denies fevers or chills.  No cough.  Objective: Vitals:   12/21/16 0754 12/21/16 0840 12/21/16 0841 12/21/16 1251  BP: 132/63   (!) 112/58  Pulse: 67   77  Resp: 18   16  Temp: 98.2 F (36.8 C)   97.9 F (36.6 C)  TempSrc: Oral   Oral  SpO2: 93% 100% 100% 94%  Weight:      Height:        Intake/Output Summary (Last 24 hours) at 12/21/2016 1334 Last data filed at 12/21/2016 1252 Gross per 24 hour  Intake 1040 ml  Output 655 ml  Net 385 ml   Filed Weights   12/19/16 1245  Weight: 65.8 kg (145 lb)    Examination:  General exam: Appears calm and comfortable  Respiratory system: Clear to auscultation, diminished.  Lungs are hyperinflated. Respiratory effort normal. Cardiovascular system: S1 & S2 heard, RRR. No JVD, murmurs, rubs, gallops or clicks. No pedal edema. Gastrointestinal system: Abdomen is nondistended, soft and nontender.  No organomegaly or masses felt. Normal bowel sounds heard. Central nervous system: Alert and oriented. No focal neurological deficits. Skin: No rashes, lesions or ulcers Psychiatry: Judgement and insight appear normal. Mood & affect appropriate.   Data Reviewed: I have personally reviewed following labs and imaging studies  CBC: Recent Labs  Lab 12/19/16 1332 12/20/16 1036 12/21/16 0358  WBC 16.0* 7.4 20.0*  NEUTROABS 11.5*  --   --   HGB 16.3 12.2* 12.5*  HCT 50.1 36.2* 38.2*  MCV 93.3 93.3 92.3  PLT 426* 148* 203   Basic Metabolic Panel: Recent Labs  Lab 12/19/16 1332  12/20/16 1036 12/21/16 0358  NA 133* 135 133*  K 4.0 3.9 4.1  CL 97* 103 101  CO2 23 24 24   GLUCOSE 107* 116* 127*  BUN 15 14 25*  CREATININE 0.87 0.88 1.18  CALCIUM 9.6 8.5* 8.4*   GFR: Estimated Creatinine Clearance: 49.6 mL/min (by C-G formula based on SCr of 1.18 mg/dL). Liver Function Tests: No results for input(s): AST, ALT, ALKPHOS, BILITOT, PROT, ALBUMIN in the last 168 hours. No results for input(s): LIPASE, AMYLASE in the last 168 hours. No results for input(s): AMMONIA in the last 168 hours. Coagulation Profile: Recent Labs  Lab 12/19/16 1332  INR 0.95   Cardiac Enzymes: Recent Labs  Lab 12/19/16 1332  TROPONINI <0.03   BNP (last 3 results) No results for input(s): PROBNP in the last 8760 hours. HbA1C: No results for input(s): HGBA1C in the last 72 hours. CBG: No results for input(s): GLUCAP in the last 168 hours. Lipid Profile: No results for input(s): CHOL, HDL, LDLCALC, TRIG, CHOLHDL, LDLDIRECT in the last 72 hours. Thyroid Function Tests: No results for input(s): TSH, T4TOTAL, FREET4, T3FREE, THYROIDAB in the last 72 hours. Anemia Panel: No results for input(s): VITAMINB12, FOLATE, FERRITIN, TIBC, IRON, RETICCTPCT in the last 72 hours. Sepsis Labs: No results for input(s): PROCALCITON, LATICACIDVEN in the last 168 hours.  Recent Results (from the past 240 hour(s))  Surgical PCR screen     Status: Abnormal   Collection Time: 12/19/16 10:42 AM  Result Value Ref Range Status   MRSA, PCR POSITIVE (A) NEGATIVE Final   Staphylococcus aureus POSITIVE (A) NEGATIVE Final    Comment: RESULT CALLED TO, READ BACK BY AND VERIFIED WITH: Waylan Rocher AT 5597 12/20/16 BY L BENFIELD (NOTE) The Xpert SA Assay (FDA approved for NASAL specimens in patients 76 years of age and older), is one component of a comprehensive surveillance program. It is not intended to diagnose infection nor to guide or monitor treatment.   Culture, blood (Routine X 2) w Reflex to ID  Panel     Status: None (Preliminary result)   Collection Time: 12/19/16  4:38 PM  Result Value Ref Range Status   Specimen Description LEFT ANTECUBITAL  Final   Special Requests   Final    BOTTLES DRAWN AEROBIC AND ANAEROBIC Blood Culture adequate volume   Culture NO GROWTH < 24 HOURS  Final   Report Status PENDING  Incomplete  Culture, blood (Routine X 2) w Reflex to ID Panel     Status: None (Preliminary result)   Collection Time: 12/19/16  4:38 PM  Result Value Ref Range Status   Specimen Description BLOOD LEFT HAND  Final   Special Requests   Final    BOTTLES DRAWN AEROBIC AND ANAEROBIC Blood Culture adequate volume   Culture  Setup Time   Final    GRAM POSITIVE RODS Gram Stain Report Called to,Read Back  By and Verified With: NOTIFIED NURSE JOYCE AT 6269 ON 12/21/2016 BY EVA AEROBIC BOTTLE ONLY Performed at [facility], READ BACK BY AND VERIFIED WITH: E SINCLAIR,PHARMD AT 4854 12/21/16 BY L BENFIELD Performed at [facility], [place] [address] [place], [place] [address]    Culture GRAM POSITIVE RODS  Final   Report Status PENDING  Incomplete  Blood Culture ID Panel (Reflexed)     Status: None   Collection Time: 12/19/16  4:38 PM  Result Value Ref Range Status   Enterococcus species NOT DETECTED NOT DETECTED Final   Listeria monocytogenes NOT DETECTED NOT DETECTED Final   Staphylococcus species NOT DETECTED NOT DETECTED Final   Staphylococcus aureus NOT DETECTED NOT DETECTED Final   Streptococcus species NOT DETECTED NOT DETECTED Final   Streptococcus agalactiae NOT DETECTED NOT DETECTED Final   Streptococcus pneumoniae NOT DETECTED NOT DETECTED Final   Streptococcus pyogenes NOT DETECTED NOT DETECTED Final   Acinetobacter baumannii NOT DETECTED NOT DETECTED Final   Enterobacteriaceae species NOT DETECTED NOT DETECTED Final   Enterobacter cloacae complex NOT DETECTED NOT DETECTED Final   Escherichia coli NOT DETECTED NOT DETECTED Final    Klebsiella oxytoca NOT DETECTED NOT DETECTED Final   Klebsiella pneumoniae NOT DETECTED NOT DETECTED Final   Proteus species NOT DETECTED NOT DETECTED Final   Serratia marcescens NOT DETECTED NOT DETECTED Final   Haemophilus influenzae NOT DETECTED NOT DETECTED Final   Neisseria meningitidis NOT DETECTED NOT DETECTED Final   Pseudomonas aeruginosa NOT DETECTED NOT DETECTED Final   Candida albicans NOT DETECTED NOT DETECTED Final   Candida glabrata NOT DETECTED NOT DETECTED Final   Candida krusei NOT DETECTED NOT DETECTED Final   Candida parapsilosis NOT DETECTED NOT DETECTED Final   Candida tropicalis NOT DETECTED NOT DETECTED Final    Comment: Performed at [facility], [place]. [address], [place], [place] [address]       Radiology Studies: Pelvis Portable  Result Date: 12/20/2016 CLINICAL DATA:  Postop right hip replacement. EXAM: PORTABLE PELVIS 1-2 VIEWS COMPARISON:  12/19/2016 FINDINGS: Sequelae of interval right hip bipolar hemiarthroplasty are identified with postoperative gas in the surrounding soft tissues. There is no evidence of acute fracture or dislocation on this single AP image. Small calcifications in the pelvis likely represent phleboliths. IMPRESSION: Interval right hip hemiarthroplasty without evidence of acute complication. Electronically Signed   By: Logan Bores M.D.   On: 12/20/2016 10:20      Scheduled Meds: . acetaminophen  1,000 mg Oral Q8H  . amLODipine  10 mg Oral Daily  . aspirin EC  81 mg Oral Daily  . celecoxib  200 mg Oral Daily  . Chlorhexidine Gluconate Cloth  6 each Topical Q0600  . enoxaparin (LOVENOX) injection  40 mg Subcutaneous Q24H  . ferrous sulfate  325 mg Oral Q breakfast  . folic acid  1 mg Oral Daily  . gabapentin  300 mg Oral TID  . guaiFENesin  600 mg Oral BID  . ipratropium-albuterol  3 mL Nebulization BID  . lisinopril  20 mg Oral Daily  . metoCLOPramide  5 mg Oral TID AC & HS  . metoprolol succinate  25 mg Oral Daily  .  mometasone-formoterol  2 puff Inhalation BID  . multivitamin with minerals  1 tablet Oral Daily  . mupirocin ointment  1 application Nasal BID  . nicotine  21 mg Transdermal Daily  . pantoprazole  40 mg Oral Daily  . senna  1 tablet Oral BID  . simvastatin  10 mg Oral Daily  . sodium chloride flush  3 mL Intravenous Q12H  . thiamine  100 mg Oral Daily   Continuous Infusions: . sodium chloride    . lactated ringers 10 mL/hr at 12/20/16 0651     LOS: 2 days    Time spent: 30 minutes   Dessa Phi, DO [facility] [email or web address] Password [email or web address] 12/21/2016, 1:34 PM

## 2016-12-22 ENCOUNTER — Encounter (HOSPITAL_COMMUNITY): Payer: Self-pay | Admitting: Orthopaedic Surgery

## 2016-12-22 ENCOUNTER — Inpatient Hospital Stay (HOSPITAL_COMMUNITY): Payer: Medicare HMO

## 2016-12-22 DIAGNOSIS — S72009A Fracture of unspecified part of neck of unspecified femur, initial encounter for closed fracture: Secondary | ICD-10-CM | POA: Diagnosis not present

## 2016-12-22 LAB — BASIC METABOLIC PANEL
Anion gap: 4 — ABNORMAL LOW (ref 5–15)
Anion gap: 8 (ref 5–15)
BUN: 24 mg/dL — ABNORMAL HIGH (ref 6–20)
BUN: 27 mg/dL — ABNORMAL HIGH (ref 6–20)
CALCIUM: 8.4 mg/dL — AB (ref 8.9–10.3)
CHLORIDE: 103 mmol/L (ref 101–111)
CHLORIDE: 102 mmol/L (ref 101–111)
CO2: 29 mmol/L (ref 22–32)
CO2: 27 mmol/L (ref 22–32)
CREATININE: 1.13 mg/dL (ref 0.61–1.24)
Calcium: 8.5 mg/dL — ABNORMAL LOW (ref 8.9–10.3)
Creatinine, Ser: 0.89 mg/dL (ref 0.61–1.24)
GFR calc non Af Amer: >60 mL/min (ref >60)
GLUCOSE: 100 mg/dL — AB (ref 65–99)
Glucose, Bld: 105 mg/dL — ABNORMAL HIGH (ref 65–99)
POTASSIUM: 3.9 mmol/L (ref 3.5–5.1)
Potassium: 4 mmol/L (ref 3.5–5.1)
SODIUM: 136 mmol/L (ref 135–145)
Sodium: 137 mmol/L (ref 135–145)

## 2016-12-22 LAB — CBC
HCT: 39.1 % (ref 39.0–52.0)
HEMATOCRIT: 40.3 % (ref 39.0–52.0)
HEMOGLOBIN: 12.8 g/dL — AB (ref 13.0–17.0)
HEMOGLOBIN: 13.1 g/dL (ref 13.0–17.0)
MCH: 30 pg (ref 26.0–34.0)
MCH: 30.2 pg (ref 26.0–34.0)
MCHC: 32.7 g/dL (ref 30.0–36.0)
MCHC: 32.5 g/dL (ref 30.0–36.0)
MCV: 91.6 fL (ref 78.0–100.0)
MCV: 92.9 fL (ref 78.0–100.0)
PLATELETS: 344 10*3/uL (ref 150–400)
Platelets: 324 10*3/uL (ref 150–400)
RBC: 4.27 MIL/uL (ref 4.22–5.81)
RBC: 4.34 MIL/uL (ref 4.22–5.81)
RDW: 15.8 % — ABNORMAL HIGH (ref 11.5–15.5)
RDW: 15.9 % — AB (ref 11.5–15.5)
WBC: 15.3 10*3/uL — AB (ref 4.0–10.5)
WBC: 21 10*3/uL — ABNORMAL HIGH (ref 4.0–10.5)

## 2016-12-22 MED ORDER — SODIUM CHLORIDE 0.9 % IV BOLUS (SEPSIS)
1000.0000 mL | Freq: Once | INTRAVENOUS | Status: AC
  Administered 2016-12-22: 1000 mL via INTRAVENOUS

## 2016-12-22 NOTE — Progress Notes (Addendum)
PROGRESS NOTE    Bradley [name]  IEP:[id number] DOB: 1940/11/22 DOA: 12/19/2016 PCP: Jani Gravel, MD     Brief Narrative:  Bradley [name] is a 76 yo male with past medical history of COPD, hypertension, tobacco abuse who presented to the emergency department after a fall.  He states that he was at home, was playing with his granddaughter when he tripped and fell onto his right hip.  He was able to crawl to the bed, but unable to stand or ambulate due to right hip pain.  EMS was called.  Workup revealed right hip fracture.  He was transferred to [facility] and underwent right hip hemiarthroplasty on 11/24 with Dr. Griffin Basil.  Assessment & Plan:   Principal Problem:   Rt Hip fracture ([place]) Active Problems:   Tobacco abuse   Emphysema, unspecified ([place])   CAD (coronary artery disease), native coronary artery   Essential hypertension   Leukocytosis   Right hip fracture after mechanical fall -S/p right hemiarthroplasty 11/24 with Dr. Griffin Basil -PT OT --[facility] home with home health. Equipment has been ordered  -Pain control  -WBAT -DVT ppx lovenox 40mg  daily -Follow up in 10-14 days for wound check with Dr. Griffin Basil   Hypotension -Called by RN this morning due to patient blood pressure 73/46 with complaints of dizziness and sweating.  I evaluated patient at bedside.  Normal saline bolus given, held a.m. blood pressure medications, EKG obtained which was reviewed independently which showed normal sinus rhythm without any ST changes, stat labs ordered and chest x-ray ordered.  Chest x-ray was reviewed independently which showed hyperinflated lungs without acute pulmonary process or consolidation. -Spoke with daughter, patient apparently has low blood pressures intermittently at home. -Hold blood pressure medications for now  Leukocytosis -Likely reactive. No fever or signs of infection. Repeat CBC in AM  Gram positive rod bacteremia -BCID negative, second set blood culture negative to  date -Likely contaminant  COPD -Without acute exacerbation   Chronic systolic dysfunction CHF  -Without acute exacerbation  CAD -Continue aspirin and simvastatin   Tobacco Abuse -Encourage cessation  -Nicotine patch    DVT prophylaxis: lovenox Code Status: full Family Communication: no family at bedside, spoke with daughter over the phone 11/26 Disposition Plan: home tomorrow if stable    Consultants:   Orthopedic surgery   Procedures:   Right hip hemiarthroplasty 11/24 with Dr. Griffin Basil   Antimicrobials:  Anti-infectives (From admission, onward)   Start     Dose/Rate Route Frequency Ordered Stop   12/20/16 1030  ceFAZolin (ANCEF) IVPB 2g/100 mL premix     2 g 200 mL/hr over 30 Minutes Intravenous Every 6 hours 12/20/16 1021 12/20/16 1659   12/20/16 0645  ceFAZolin (ANCEF) IVPB 2g/100 mL premix     2 g 200 mL/hr over 30 Minutes Intravenous On call to O.R. 12/20/16 6606 12/20/16 0802       Subjective:  Called by RN this morning due to patient blood pressure 73/46 with complaints of dizziness and sweating.  On examination, patient complains of dizziness, diaphoresis but without any shortness of breath or chest pain.  Symptoms slowly improving with IV fluid bolus.  He did work with physical therapy earlier this morning without issue.  Objective: Vitals:   12/22/16 0750 12/22/16 0842 12/22/16 0900 12/22/16 0935  BP: (!) 73/46  (!) 83/46 (!) 103/50  Pulse: 87  69 66  Resp: 17  17 18   Temp:      TempSrc:      SpO2:  98% 94% 95%   Weight:      Height:       No intake or output data in the 24 hours ending 12/22/16 1417 Filed Weights   12/19/16 1245  Weight: 65.8 kg (145 lb)    Examination:  General exam: Appears calm and comfortable  Respiratory system: Clear to auscultation, diminished.  Lungs are hyperinflated. Respiratory effort normal. Cardiovascular system: S1 & S2 heard, RRR. No JVD, murmurs, rubs, gallops or clicks. No pedal edema. Gastrointestinal  system: Abdomen is nondistended, soft and nontender. No organomegaly or masses felt. Normal bowel sounds heard. Central nervous system: Alert and oriented. No focal neurological deficits. Skin: No rashes, lesions or ulcers Psychiatry: Judgement and insight appear normal. Mood & affect appropriate.   Data Reviewed: I have personally reviewed following labs and imaging studies  CBC: Recent Labs  Lab 12/19/16 1332 12/20/16 1036 12/21/16 0358 12/22/16 0431 12/22/16 1020  WBC 16.0* 7.4 20.0* 15.3* 21.0*  NEUTROABS 11.5*  --   --   --   --   HGB 16.3 12.2* 12.5* 12.8* 13.1  HCT 50.1 36.2* 38.2* 39.1 40.3  MCV 93.3 93.3 92.3 91.6 92.9  PLT 426* 148* 340 344 638   Basic Metabolic Panel: Recent Labs  Lab 12/19/16 1332 12/20/16 1036 12/21/16 0358 12/22/16 0431 12/22/16 1020  NA 133* 135 133* 136 137  K 4.0 3.9 4.1 3.9 4.0  CL 97* 103 101 103 102  CO2 23 24 24 29 27   GLUCOSE 107* 116* 127* 105* 100*  BUN 15 14 25* 24* 27*  CREATININE 0.87 0.88 1.18 0.89 1.13  CALCIUM 9.6 8.5* 8.4* 8.5* 8.4*   GFR: Estimated Creatinine Clearance: 51.8 mL/min (by C-G formula based on SCr of 1.13 mg/dL). Liver Function Tests: No results for input(s): AST, ALT, ALKPHOS, BILITOT, PROT, ALBUMIN in the last 168 hours. No results for input(s): LIPASE, AMYLASE in the last 168 hours. No results for input(s): AMMONIA in the last 168 hours. Coagulation Profile: Recent Labs  Lab 12/19/16 1332  INR 0.95   Cardiac Enzymes: Recent Labs  Lab 12/19/16 1332  TROPONINI <0.03   BNP (last 3 results) No results for input(s): PROBNP in the last 8760 hours. HbA1C: No results for input(s): HGBA1C in the last 72 hours. CBG: No results for input(s): GLUCAP in the last 168 hours. Lipid Profile: No results for input(s): CHOL, HDL, LDLCALC, TRIG, CHOLHDL, LDLDIRECT in the last 72 hours. Thyroid Function Tests: No results for input(s): TSH, T4TOTAL, FREET4, T3FREE, THYROIDAB in the last 72 hours. Anemia  Panel: No results for input(s): VITAMINB12, FOLATE, FERRITIN, TIBC, IRON, RETICCTPCT in the last 72 hours. Sepsis Labs: No results for input(s): PROCALCITON, LATICACIDVEN in the last 168 hours.  Recent Results (from the past 240 hour(s))  Surgical PCR screen     Status: Abnormal   Collection Time: 12/19/16 10:42 AM  Result Value Ref Range Status   MRSA, PCR POSITIVE (A) NEGATIVE Final   Staphylococcus aureus POSITIVE (A) NEGATIVE Final    Comment: RESULT CALLED TO, READ BACK BY AND VERIFIED WITH: Waylan Rocher AT 4536 12/20/16 BY L BENFIELD (NOTE) The Xpert SA Assay (FDA approved for NASAL specimens in patients 65 years of age and older), is one component of a comprehensive surveillance program. It is not intended to diagnose infection nor to guide or monitor treatment.   Culture, blood (Routine X 2) w Reflex to ID Panel     Status: None (Preliminary result)   Collection Time: 12/19/16  4:38 PM  Result Value Ref Range Status   Specimen Description LEFT ANTECUBITAL  Final   Special Requests   Final    BOTTLES DRAWN AEROBIC AND ANAEROBIC Blood Culture adequate volume   Culture NO GROWTH 3 DAYS  Final   Report Status PENDING  Incomplete  Culture, blood (Routine X 2) w Reflex to ID Panel     Status: None (Preliminary result)   Collection Time: 12/19/16  4:38 PM  Result Value Ref Range Status   Specimen Description BLOOD LEFT HAND  Final   Special Requests   Final    BOTTLES DRAWN AEROBIC AND ANAEROBIC Blood Culture adequate volume   Culture  Setup Time   Final    GRAM POSITIVE RODS Gram Stain Report Called to,Read Back By and Verified With: NOTIFIED NURSE JOYCE AT 6295 ON 12/21/2016 BY EVA AEROBIC BOTTLE ONLY Performed at [place], READ BACK BY AND VERIFIED WITH: E SINCLAIR,PHARMD AT 2841 12/21/16 BY L BENFIELD Performed at [facility], [place] [address] [place], [place] [address]    Culture GRAM POSITIVE RODS  Final   Report Status PENDING   Incomplete  Blood Culture ID Panel (Reflexed)     Status: None   Collection Time: 12/19/16  4:38 PM  Result Value Ref Range Status   Enterococcus species NOT DETECTED NOT DETECTED Final   Listeria monocytogenes NOT DETECTED NOT DETECTED Final   Staphylococcus species NOT DETECTED NOT DETECTED Final   Staphylococcus aureus NOT DETECTED NOT DETECTED Final   Streptococcus species NOT DETECTED NOT DETECTED Final   Streptococcus agalactiae NOT DETECTED NOT DETECTED Final   Streptococcus pneumoniae NOT DETECTED NOT DETECTED Final   Streptococcus pyogenes NOT DETECTED NOT DETECTED Final   Acinetobacter baumannii NOT DETECTED NOT DETECTED Final   Enterobacteriaceae species NOT DETECTED NOT DETECTED Final   Enterobacter cloacae complex NOT DETECTED NOT DETECTED Final   Escherichia coli NOT DETECTED NOT DETECTED Final   Klebsiella oxytoca NOT DETECTED NOT DETECTED Final   Klebsiella pneumoniae NOT DETECTED NOT DETECTED Final   Proteus species NOT DETECTED NOT DETECTED Final   Serratia marcescens NOT DETECTED NOT DETECTED Final   Haemophilus influenzae NOT DETECTED NOT DETECTED Final   Neisseria meningitidis NOT DETECTED NOT DETECTED Final   Pseudomonas aeruginosa NOT DETECTED NOT DETECTED Final   Candida albicans NOT DETECTED NOT DETECTED Final   Candida glabrata NOT DETECTED NOT DETECTED Final   Candida krusei NOT DETECTED NOT DETECTED Final   Candida parapsilosis NOT DETECTED NOT DETECTED Final   Candida tropicalis NOT DETECTED NOT DETECTED Final    Comment: Performed at [facility], [place]. [address], [place], [place] [address]       Radiology Studies: Dg Chest Port 1 View  Result Date: 12/22/2016 CLINICAL DATA:  Shortness of breath, dizziness and diaphoresis. Hypotension. Hip surgery. EXAM: PORTABLE CHEST 1 VIEW COMPARISON:  12/19/2016. FINDINGS: Trachea is midline. Heart size normal. Thoracic aorta is calcified and somewhat tortuous. Lungs may be mildly hyperinflated. No airspace  consolidation or pleural fluid. IMPRESSION: 1. No acute findings. 2.  Aortic atherosclerosis (ICD10-170.0). Electronically Signed   By: Lorin Picket M.D.   On: 12/22/2016 10:33      Scheduled Meds: . acetaminophen  1,000 mg Oral Q8H  . aspirin EC  81 mg Oral Daily  . celecoxib  200 mg Oral Daily  . Chlorhexidine Gluconate Cloth  6 each Topical Q0600  . enoxaparin (LOVENOX) injection  40 mg Subcutaneous Q24H  . ferrous  sulfate  325 mg Oral Q breakfast  . folic acid  1 mg Oral Daily  . gabapentin  300 mg Oral TID  . guaiFENesin  600 mg Oral BID  . metoCLOPramide  5 mg Oral TID AC & HS  . mometasone-formoterol  2 puff Inhalation BID  . multivitamin with minerals  1 tablet Oral Daily  . mupirocin ointment  1 application Nasal BID  . nicotine  21 mg Transdermal Daily  . pantoprazole  40 mg Oral Daily  . senna  1 tablet Oral BID  . simvastatin  10 mg Oral Daily  . sodium chloride flush  3 mL Intravenous Q12H  . thiamine  100 mg Oral Daily   Continuous Infusions: . sodium chloride    . lactated ringers 10 mL/hr at 12/22/16 1336     LOS: 3 days    Time spent: 40 minutes   Dessa Phi, DO [facility] [email or web address] Password [email or web address] 12/22/2016, 2:17 PM

## 2016-12-22 NOTE — Progress Notes (Signed)
Orthopedic Tech Progress Note Patient Details:  Bradley [name] [birth year] [id number]  Patient ID: [name], male   DOB: [birth year], 76 y.o.   MRN: [id number] Pt cant have ohf due to age restrictions  Karolee Stamps 12/22/2016, 11:13 PM

## 2016-12-22 NOTE — Progress Notes (Signed)
Physical Therapy Treatment Patient Details Name: Bradley [name] MRN: [id number] DOB: 08/18/1940 Today's Date: 12/22/2016    History of Present Illness 76 y.o. s/p Rt hip hemiarthroplasty. PMH includes CAD, HTN, migraine, MI, pneumonia, chronic lower back pain,     PT Comments    Pt progressing towards physical therapy goals. Was able to perform transfers and ambulation with gross min guard assist for safety. Pt somewhat self-limiting throughout session however overall mobilized well when he attempted. Increased encouragement was provided for participation. Will continue to follow and progress as able per POC.    Follow Up Recommendations  DC plan and follow up therapy as arranged by surgeon     Equipment Recommendations  Rolling walker with 5" wheels    Recommendations for Other Services       Precautions / Restrictions Precautions Precautions: Posterior Hip Precaution Booklet Issued: No Precaution Comments: educated on 3/3 hip precautions. Pt able to recall 2/3 at beginning of session.  Required Braces or Orthoses: Other Brace/Splint Other Brace/Splint: hip abductor pillow Restrictions Weight Bearing Restrictions: Yes RLE Weight Bearing: Weight bearing as tolerated LLE Weight Bearing: Weight bearing as tolerated    Mobility  Bed Mobility Overal bed mobility: Needs Assistance Bed Mobility: Supine to Sit     Supine to sit: Min guard     General bed mobility comments: VC's for maintenance of hip precautions. Pt was able to advance RLE to EOB without assistance, however increased time required and appeared very effortful with use of rails.   Transfers Overall transfer level: Needs assistance Equipment used: Rolling walker (2 wheeled) Transfers: Sit to/from Stand Sit to Stand: Min guard         General transfer comment: Pt demonstrated proper hand placement on seated surface for safety.   Ambulation/Gait Ambulation/Gait assistance: Min guard Ambulation Distance  (Feet): 250 Feet Assistive device: Rolling walker (2 wheeled) Gait Pattern/deviations: Step-through pattern;Antalgic;Decreased stance time - right;Trunk flexed Gait velocity: decreased Gait velocity interpretation: Below normal speed for age/gender General Gait Details: VC's for improved posture and bilateral shoulder depression (elevated due to tension throughout upper body). Pt was able to ambulate fairly well with RW with no assist required. Close guard provided throughout gait training for safety.    Stairs Stairs: Yes   Stair Management: No rails;Step to pattern;Forwards;Backwards;With walker Number of Stairs: 1(x3 trials) General stair comments: VC's for sequencing and general safety. Practiced with low step to simulate home environment. Pt was able to ascend forwards and descend backwards.   Wheelchair Mobility    Modified Rankin (Stroke Patients Only)       Balance Overall balance assessment: Needs assistance Sitting-balance support: No upper extremity supported;Feet supported Sitting balance-Leahy Scale: Good     Standing balance support: Bilateral upper extremity supported;During functional activity Standing balance-Leahy Scale: Poor Standing balance comment: reliant on RW                             Cognition Arousal/Alertness: Awake/alert Behavior During Therapy: WFL for tasks assessed/performed Overall Cognitive Status: Within Functional Limits for tasks assessed                                 General Comments: Low literacy      Exercises General Exercises - Lower Extremity Quad Sets: 10 reps;Seated Long Arc Quad: 10 reps;Seated Heel Slides: 5 reps;Supine Hip ABduction/ADduction: 10 reps;Seated    General Comments  Pertinent Vitals/Pain Pain Assessment: 0-10 Pain Score: 6  Pain Location: left hip Pain Descriptors / Indicators: Aching;Discomfort;Sore Pain Intervention(s): Monitored during session;Limited activity  within patient's tolerance;Repositioned;Premedicated before session    Home Living                      Prior Function            PT Goals (current goals can now be found in the care plan section) Acute Rehab PT Goals Patient Stated Goal: get back home; drive by Dec 4 PT Goal Formulation: With patient Time For Goal Achievement: 12/28/16 Potential to Achieve Goals: Good Progress towards PT goals: Progressing toward goals    Frequency    7X/week      PT Plan Current plan remains appropriate    Co-evaluation              AM-PAC PT "6 Clicks" Daily Activity  Outcome Measure  Difficulty turning over in bed (including adjusting bedclothes, sheets and blankets)?: A Little Difficulty moving from lying on back to sitting on the side of the bed? : A Little Difficulty sitting down on and standing up from a chair with arms (e.g., wheelchair, bedside commode, etc,.)?: A Little Help needed moving to and from a bed to chair (including a wheelchair)?: A Little Help needed walking in hospital room?: A Little Help needed climbing 3-5 steps with a railing? : A Little 6 Click Score: 18    End of Session Equipment Utilized During Treatment: Gait belt Activity Tolerance: Patient tolerated treatment well Patient left: in bed;with call bell/phone within reach Nurse Communication: Mobility status PT Visit Diagnosis: Difficulty in walking, not elsewhere classified (R26.2)     Time: 9532-0233 PT Time Calculation (min) (ACUTE ONLY): 33 min  Charges:  $Gait Training: 8-22 mins $Therapeutic Exercise: 8-22 mins                    G Codes:       Rolinda Roan, PT, DPT Acute Rehabilitation Services Pager: [place] 12/22/2016, 9:25 AM

## 2016-12-22 NOTE — Progress Notes (Signed)
Pt c/o dizziness, and sweating. Pt BP 73/46 at 7:50am. Dr. Maylene Roes was notified of situation. MD ordered STAT EKG, NS bolus of 1057mL.   Bolus was administered, pt feeling better, BP 103/50. Will continue to monitor.

## 2016-12-22 NOTE — Progress Notes (Signed)
ORTHOPAEDIC PROGRESS NOTE  s/p Procedure(s): ARTHROPLASTY BIPOLAR HIP (HEMIARTHROPLASTY)  SUBJECTIVE: Reports mild pain about operative site. No chest pain. No SOB. No nausea/vomiting. No other complaints.  OBJECTIVE: PE:  [id number] CDI, abduction pillow in place, leg lengths equal, warm well perfused foot, intact EHL/TA/GSC   Vitals:   12/21/16 2023 12/22/16 0415  BP:  114/62  Pulse:  87  Resp:  17  Temp:  98.2 F (36.8 C)  SpO2: 96% 93%     ASSESSMENT: Bradley [name] is a 76 y.o. male doing well postoperatively.  PLAN: Weightbearing: WBAT RLE posterior hip precautions Insicional and dressing care: OK to remove dressings 7 days postop and leave open to air with dry gauze PRN Orthopedic device(s):Abduction pillow while in bed until followup Showering: ok to shower with aquacel on. VTE prophylaxis: Lovenox 40mg  qd 2 weeks, transition to aspirin likely at that point. Pain control: Scheduled APAP, prn oxycodone Follow - up plan: 2 weeks with Ophelia Charter at [facility] information:  [id number] 8-5 Ophelia Charter MD [phone], After hours and holidays please check [email or web address] for group call information for [facility]

## 2016-12-22 NOTE — Progress Notes (Signed)
Occupational Therapy Treatment Patient Details Name: Bradley [name] MRN: [id number] DOB: 12-Jan-1941 Today's Date: 12/22/2016    History of present illness 76 y.o. s/p Rt hip hemiarthroplasty. PMH includes CAD, HTN, migraine, MI, pneumonia, chronic lower back pain,    OT comments  Pt making progress with functional goals. OT will continue to follow acutely  Follow Up Recommendations  No OT follow up;Supervision - Intermittent    Equipment Recommendations  3 in 1 bedside commode;Other (comment);Tub/shower bench(ADL A/E )    Recommendations for Other Services      Precautions / Restrictions Precautions Precautions: Posterior Hip Precaution Booklet Issued: No Precaution Comments:  Pt able to recall 2/3 at beginning of session. Reviewed all hip recautions with pt Required Braces or Orthoses: Other Brace/Splint Other Brace/Splint: hip abductor pillow Restrictions Weight Bearing Restrictions: Yes RLE Weight Bearing: Weight bearing as tolerated LLE Weight Bearing: Weight bearing as tolerated       Mobility Bed Mobility Overal bed mobility: Needs Assistance Bed Mobility: Supine to Sit     Supine to sit: Min guard Sit to supine: Min assist   General bed mobility comments: A with R LE back onto bed  Transfers Overall transfer level: Needs assistance Equipment used: Rolling walker (2 wheeled) Transfers: Sit to/from Stand Sit to Stand: Min guard         General transfer comment: Pt demonstrated proper hand placement on seated surface for safety.     Balance Overall balance assessment: Needs assistance Sitting-balance support: No upper extremity supported;Feet supported Sitting balance-Leahy Scale: Good     Standing balance support: Bilateral upper extremity supported;During functional activity Standing balance-Leahy Scale: Poor Standing balance comment: reliant on RW                            ADL either performed or assessed with clinical judgement    ADL Overall ADL's : Needs assistance/impaired     Grooming: Wash/dry hands;Wash/dry face;Standing;Min guard       Lower Body Bathing: Sitting/lateral leans;Moderate assistance       Lower Body Dressing: Moderate assistance;Sit to/from stand   Toilet Transfer: Min guard;RW;Ambulation   Toileting- Water quality scientist and Hygiene: Minimal assistance;Sit to/from stand   Tub/ Shower Transfer: Min guard;3 in [place] walker;Ambulation     General ADL Comments: pt educated on ADL A/E for LB dressing, handout provided     Vision Patient Visual Report: No change from baseline     Perception     Praxis      Cognition Arousal/Alertness: Awake/alert Behavior During Therapy: WFL for tasks assessed/performed Overall Cognitive Status: Within Functional Limits for tasks assessed                                 General Comments: Low literacy        Exercises    Shoulder Instructions       General Comments  pt very pleasant and cooperative    Pertinent Vitals/ Pain       Pain Assessment: 0-10 Pain Score: 4  Pain Location: R hip Pain Descriptors / Indicators: Aching;Discomfort;Sore Pain Intervention(s): Monitored during session;Repositioned;Premedicated before session  Home Living                                          Prior Functioning/Environment  Frequency  Min 2X/week        Progress Toward Goals  OT Goals(current goals can now be found in the care plan section)  Progress towards OT goals: Progressing toward goals  Acute Rehab OT Goals Patient Stated Goal: get back home; drive by Dec 4  Plan      Co-evaluation                 AM-PAC PT "6 Clicks" Daily Activity     Outcome Measure   Help from another person eating meals?: None Help from another person taking care of personal grooming?: A Little Help from another person toileting, which includes using toliet, bedpan, or urinal?: A  Little Help from another person bathing (including washing, rinsing, drying)?: A Little Help from another person to put on and taking off regular upper body clothing?: A Little Help from another person to put on and taking off regular lower body clothing?: A Lot 6 Click Score: 18    End of Session Equipment Utilized During Treatment: Gait belt;Rolling walker;Other (comment)(3 in 1)  OT Visit Diagnosis: Pain;Unsteadiness on feet (R26.81);Other abnormalities of gait and mobility (R26.89) Pain - Right/Left: Right Pain - part of body: Hip   Activity Tolerance Patient tolerated treatment well   Patient Left with call bell/phone within reach;in chair   Nurse Communication      Functional Assessment Tool Used: AM-PAC 6 Clicks Daily Activity   Time: 0240-9735 OT Time Calculation (min): 34 min  Charges: OT G-codes **NOT FOR INPATIENT CLASS** Functional Assessment Tool Used: AM-PAC 6 Clicks Daily Activity OT General Charges $OT Visit: 1 Visit OT Treatments $Self Care/Home Management : 8-22 mins $Therapeutic Activity: 8-22 mins     Britt Bottom 12/22/2016, 1:05 PM

## 2016-12-23 LAB — CBC
HCT: 39.6 % (ref 39.0–52.0)
HEMOGLOBIN: 12.8 g/dL — AB (ref 13.0–17.0)
MCH: 30 pg (ref 26.0–34.0)
MCHC: 32.3 g/dL (ref 30.0–36.0)
MCV: 93 fL (ref 78.0–100.0)
Platelets: 332 10*3/uL (ref 150–400)
RBC: 4.26 MIL/uL (ref 4.22–5.81)
RDW: 16.1 % — ABNORMAL HIGH (ref 11.5–15.5)
WBC: 16.8 10*3/uL — ABNORMAL HIGH (ref 4.0–10.5)

## 2016-12-23 LAB — CULTURE, BLOOD (ROUTINE X 2): SPECIAL REQUESTS: ADEQUATE

## 2016-12-23 LAB — BASIC METABOLIC PANEL
Anion gap: 8 (ref 5–15)
BUN: 32 mg/dL — AB (ref 6–20)
CHLORIDE: 102 mmol/L (ref 101–111)
CO2: 26 mmol/L (ref 22–32)
CREATININE: 0.94 mg/dL (ref 0.61–1.24)
Calcium: 8.3 mg/dL — ABNORMAL LOW (ref 8.9–10.3)
GFR calc Af Amer: >60 mL/min (ref >60)
GFR calc non Af Amer: >60 mL/min (ref >60)
Glucose, Bld: 100 mg/dL — ABNORMAL HIGH (ref 65–99)
POTASSIUM: 3.7 mmol/L (ref 3.5–5.1)
Sodium: 136 mmol/L (ref 135–145)

## 2016-12-23 MED ORDER — POLYETHYLENE GLYCOL 3350 17 G PO PACK: 17.0000 g | PACK | Freq: Every day | ORAL | 0 refills | Status: DC | PRN

## 2016-12-23 NOTE — Progress Notes (Signed)
Bradley [name] to be D/C'd Home per MD order.  Discussed prescriptions and follow up appointments with the patient. Prescriptions given to patient, medication list explained in detail. Pt verbalized understanding.  Allergies as of 12/23/2016   No Known Allergies     Medication List    STOP taking these medications   metoprolol succinate 25 MG 24 hr tablet Commonly known as:  TOPROL-XL   Potassium Chloride ER 20 MEQ Tbcr     TAKE these medications   acetaminophen 500 MG tablet Commonly known as:  TYLENOL Take 2 tablets (1,000 mg total) by mouth every 8 (eight) hours for 14 days.   ADVAIR HFA 115-21 MCG/ACT inhaler Generic drug:  fluticasone-salmeterol Inhale 2 puffs into the lungs every 12 (twelve) hours.   amLODipine 5 MG tablet Commonly known as:  NORVASC Take 1 tablet (5 mg total) by mouth daily.   aspirin EC 81 MG tablet Take 1 tablet by mouth daily.   celecoxib 200 MG capsule Commonly known as:  CELEBREX Take 1 capsule (200 mg total) by mouth daily. What changed:    medication strength  how much to take  when to take this   enoxaparin 40 MG/0.4ML injection Commonly known as:  LOVENOX Inject 0.4 mLs (40 mg total) into the skin daily.   gabapentin 300 MG capsule Commonly known as:  NEURONTIN Take 1 capsule by mouth 3 (three) times daily.   Iron 325 (65 Fe) MG Tabs Take 1 tablet by mouth daily.   lisinopril 10 MG tablet Commonly known as:  PRINIVIL,ZESTRIL Take 1 tablet (10 mg total) by mouth daily.   metoCLOPramide 5 MG tablet Commonly known as:  REGLAN Take 1 tablet by mouth 4 (four) times daily -  before meals and at bedtime.   nitroGLYCERIN 0.4 MG SL tablet Commonly known as:  NITROSTAT Place 0.4 mg under the tongue every 5 (five) minutes as needed for chest pain.   omeprazole 20 MG capsule Commonly known as:  PRILOSEC Take 40 mg by mouth daily.   oxyCODONE 5 MG immediate release tablet Commonly known as:  Oxy IR/ROXICODONE Take 1-2 pills  every 6 hrs as needed for pain What changed:    medication strength  how much to take  how to take this  when to take this  additional instructions   polyethylene glycol packet Commonly known as:  MIRALAX / GLYCOLAX Take 17 g by mouth daily as needed for mild constipation.   simvastatin 10 MG tablet Commonly known as:  ZOCOR Take 10 mg by mouth daily.   VENTOLIN HFA 108 (90 Base) MCG/ACT inhaler Generic drug:  albuterol Inhale 2 puffs into the lungs every 4 (four) hours as needed for wheezing or shortness of breath.            Durable Medical Equipment  (From admission, onward)        Start     Ordered   12/21/16 1519  For home use only DME Hospital bed  Once    Question Answer Comment  Patient has (list medical condition): right hip fracture, COPD, CHF, CAD   The above medical condition requires: Patient requires the ability to reposition frequently   Head must be elevated greater than: 30 degrees   Bed type Semi-electric      12/21/16 1518   12/21/16 1447  For home use only DME 3 n 1  Once     12/21/16 1448   12/21/16 1447  For home use only DME Walker rolling  Once    Question:  Patient needs a walker to treat with the following condition  Answer:  History of right hip hemiarthroplasty   12/21/16 1448   12/21/16 1447  For home use only DME Tub bench  Once     12/21/16 1448      Vitals:   12/23/16 0800 12/23/16 1502  BP: (!) 144/61 128/66  Pulse: 86 88  Resp: 18 18  Temp:  98 F (36.7 C)  SpO2: 99% 100%    Skin clean, dry and intact without evidence of skin break down, no evidence of skin tears noted, Aquacel dressing intacted on left hip, clean, and dry. IV catheter discontinued intact. Site without signs and symptoms of complications. Dressing and pressure applied. Pt denies pain at this time. No complaints noted.  An After Visit Summary and prescriptions were printed and given to the patient. Patient escorted via [place], and D/C home via private  auto.  [place] RN

## 2016-12-23 NOTE — Discharge Summary (Signed)
Physician Discharge Summary  Bradley [name] ZOX:[id number] DOB: 02-25-1940 DOA: 12/19/2016  PCP: Jani Gravel, MD  Admit date: 12/19/2016 Discharge date: 12/23/2016  Admitted From: Home Disposition:  Home  Recommendations for Outpatient Follow-up:  1. Follow up with PCP in 1 week 2. Follow up with Dr. Griffin Basil in 10-14 days  3. DVT ppx lovenox 40mg  daily for 2 weeks 4. Follow up with CBC as outpatient in 1 week   Weightbearing: WBAT RLE posterior hip precautions Insicional and dressing care: OK to remove dressings 7 days postop and leave open to air with dry gauze PRN Orthopedic device(s):Abduction pillow while in bed until followup Showering: ok to shower with aquacel on.   Home Health: PT OT RN Aide   Equipment/Devices: 3 n 1, hospital bed, tub bench, rolling walker    Discharge Condition: stable CODE STATUS: full  Diet recommendation: heart healthy   Brief/Interim Summary: Bradley [name] is a 76 yo male with past medical history of COPD, hypertension, tobacco abuse who presented to the emergency department after a fall.  He states that he was at home, was playing with his granddaughter when he tripped and fell onto his right hip.  He was able to crawl to the bed, but unable to stand or ambulate due to right hip pain.  EMS was called.  Workup revealed right hip fracture.  He was transferred to [facility] and underwent right hip hemiarthroplasty on 11/24 with Dr. Griffin Basil.  Discharge Diagnoses:  Principal Problem:   Rt Hip fracture ([place]) Active Problems:   Tobacco abuse   Emphysema, unspecified ([place])   CAD (coronary artery disease), native coronary artery   Essential hypertension   Leukocytosis  Right hip fracture after mechanical fall -S/p right hemiarthroplasty 11/24 with Dr. Griffin Basil -PT OT --[place] home with home health. Equipment has been ordered  -Pain control  -WBAT RLE posterior hip precautions -DVT ppx lovenox 40mg  daily -Follow up in 2 weeks for wound check  with Dr. Griffin Basil   Hypotension -Episode of hypotension on 11/26. Resolved with IVF and holding home antihypertensive -Resume home amlodipine and lisinopril. Hold toprol due to hypotension. Daughter reports that he gets hypotensive at home intermittently   Leukocytosis -Likely reactive. No fever or signs of infection. Repeat CBC as outpatient   Diphtheroids bacteremia -BCID negative, second set blood culture negative to date. Likely contaminant. Afebrile.   COPD -Without acute exacerbation   Chronic systolic dysfunction CHF  -Without acute exacerbation  CAD -Continue aspirin and simvastatin   Tobacco Abuse -Encourage cessation  -Nicotine patch     Discharge Instructions  Discharge Instructions    Call MD for:  difficulty breathing, headache or visual disturbances   Complete by:  As directed    Call MD for:  extreme fatigue   Complete by:  As directed    Call MD for:  persistant dizziness or light-headedness   Complete by:  As directed    Call MD for:  persistant nausea and vomiting   Complete by:  As directed    Call MD for:  redness, tenderness, or signs of infection (pain, swelling, redness, odor or green/yellow discharge around incision site)   Complete by:  As directed    Call MD for:  severe uncontrolled pain   Complete by:  As directed    Call MD for:  temperature >100.4   Complete by:  As directed    Diet - low sodium heart healthy   Complete by:  As directed    Discharge  instructions   Complete by:  As directed    You were cared for by a hospitalist during your hospital stay. If you have any questions about your discharge medications or the care you received while you were in the hospital after you are discharged, you can call the unit and asked to speak with the hospitalist on call if the hospitalist that took care of you is not available. Once you are discharged, your primary care physician will handle any further medical issues. Please note that NO  REFILLS for any discharge medications will be authorized once you are discharged, as it is imperative that you return to your primary care physician (or establish a relationship with a primary care physician if you do not have one) for your aftercare needs so that they can reassess your need for medications and monitor your lab values.   Increase activity slowly   Complete by:  As directed      Allergies as of 12/23/2016   No Known Allergies     Medication List    STOP taking these medications   metoprolol succinate 25 MG 24 hr tablet Commonly known as:  TOPROL-XL   Potassium Chloride ER 20 MEQ Tbcr     TAKE these medications   acetaminophen 500 MG tablet Commonly known as:  TYLENOL Take 2 tablets (1,000 mg total) by mouth every 8 (eight) hours for 14 days.   ADVAIR HFA 115-21 MCG/ACT inhaler Generic drug:  fluticasone-salmeterol Inhale 2 puffs into the lungs every 12 (twelve) hours.   amLODipine 5 MG tablet Commonly known as:  NORVASC Take 1 tablet (5 mg total) by mouth daily.   aspirin EC 81 MG tablet Take 1 tablet by mouth daily.   celecoxib 200 MG capsule Commonly known as:  CELEBREX Take 1 capsule (200 mg total) by mouth daily. What changed:    medication strength  how much to take  when to take this   enoxaparin 40 MG/0.4ML injection Commonly known as:  LOVENOX Inject 0.4 mLs (40 mg total) into the skin daily.   gabapentin 300 MG capsule Commonly known as:  NEURONTIN Take 1 capsule by mouth 3 (three) times daily.   Iron 325 (65 Fe) MG Tabs Take 1 tablet by mouth daily.   lisinopril 10 MG tablet Commonly known as:  PRINIVIL,ZESTRIL Take 1 tablet (10 mg total) by mouth daily.   metoCLOPramide 5 MG tablet Commonly known as:  REGLAN Take 1 tablet by mouth 4 (four) times daily -  before meals and at bedtime.   nitroGLYCERIN 0.4 MG SL tablet Commonly known as:  NITROSTAT Place 0.4 mg under the tongue every 5 (five) minutes as needed for chest pain.    omeprazole 20 MG capsule Commonly known as:  PRILOSEC Take 40 mg by mouth daily.   oxyCODONE 5 MG immediate release tablet Commonly known as:  Oxy IR/ROXICODONE Take 1-2 pills every 6 hrs as needed for pain What changed:    medication strength  how much to take  how to take this  when to take this  additional instructions   polyethylene glycol packet Commonly known as:  MIRALAX / GLYCOLAX Take 17 g by mouth daily as needed for mild constipation.   simvastatin 10 MG tablet Commonly known as:  ZOCOR Take 10 mg by mouth daily.   VENTOLIN HFA 108 (90 Base) MCG/ACT inhaler Generic drug:  albuterol Inhale 2 puffs into the lungs every 4 (four) hours as needed for wheezing or shortness of breath.  Durable Medical Equipment  (From admission, onward)        Start     Ordered   12/21/16 1519  For home use only DME Hospital bed  Once    Question Answer Comment  Patient has (list medical condition): right hip fracture, COPD, CHF, CAD   The above medical condition requires: Patient requires the ability to reposition frequently   Head must be elevated greater than: 30 degrees   Bed type Semi-electric      12/21/16 1518   12/21/16 1447  For home use only DME 3 n 1  Once     12/21/16 1448   12/21/16 1447  For home use only DME Walker rolling  Once    Question:  Patient needs a walker to treat with the following condition  Answer:  History of right hip hemiarthroplasty   12/21/16 1448   12/21/16 1447  For home use only DME Tub bench  Once     12/21/16 [place] Follow up.   Why:  RW, 3n1 and shower chair has been ordered by your MD and will be delivered to your room prior to discharge Contact information: [address] [place] [address] [phone]        [facility] Follow [facility].   Specialty:  Home Health Services Why:  HHPT and OT will be provided by [facility] and a  representative from this agency will be in touch within 24 hours of discharge to arrange initial visit.  Contact information: [address] [place] [address] [phone]        Jani Gravel, MD. Schedule an appointment as soon as possible for a visit in 1 week(s).   Specialty:  Internal Medicine Contact information: [address] [place] [address] [phone]        [name], MD. Schedule an appointment as soon as possible for a visit in 10 day(s).   Specialty:  Orthopedic Surgery Contact information: [address] [place] [address] [phone]          No Known Allergies  Consultations:  Orthopedic surgery    Procedures/Studies: Dg Chest 1 View  Result Date: 12/19/2016 CLINICAL DATA:  Fall this morning.  Hip fracture. EXAM: CHEST 1 VIEW COMPARISON:  Chest x-ray dated 09/11/2016. FINDINGS: Heart size and mediastinal contours are normal. Atherosclerotic changes noted at the aortic arch. Coarse lung markings again noted bilaterally indicating chronic interstitial lung disease/ fibrosis. No confluent opacity to suggest a developing pneumonia. No pleural effusion or pneumothorax seen. No acute or suspicious osseous finding. IMPRESSION: 1. No active disease.  No evidence of pneumonia or pulmonary edema. 2. Chronic interstitial lung disease/fibrosis. 3. Aortic atherosclerosis. Electronically Signed   By: Franki Cabot M.D.   On: 12/19/2016 13:30   Pelvis Portable  Result Date: 12/20/2016 CLINICAL DATA:  Postop right hip replacement. EXAM: PORTABLE PELVIS 1-2 VIEWS COMPARISON:  12/19/2016 FINDINGS: Sequelae of interval right hip bipolar hemiarthroplasty are identified with postoperative gas in the surrounding soft tissues. There is no evidence of acute fracture or dislocation on this single AP image. Small calcifications in the pelvis likely represent phleboliths. IMPRESSION: Interval right hip hemiarthroplasty without  evidence of acute complication. Electronically Signed   By: Logan Bores M.D.   On: 12/20/2016 10:20   Dg Chest Port 1 View  Result Date: 12/22/2016 CLINICAL DATA:  Shortness of breath,  dizziness and diaphoresis. Hypotension. Hip surgery. EXAM: PORTABLE CHEST 1 VIEW COMPARISON:  12/19/2016. FINDINGS: Trachea is midline. Heart size normal. Thoracic aorta is calcified and somewhat tortuous. Lungs may be mildly hyperinflated. No airspace consolidation or pleural fluid. IMPRESSION: 1. No acute findings. 2.  Aortic atherosclerosis (ICD10-170.0). Electronically Signed   By: Lorin Picket M.D.   On: 12/22/2016 10:33   Dg Hip Unilat W Or Wo Pelvis 2-3 Views Right  Result Date: 12/19/2016 CLINICAL DATA:  Right hip pain after fall. EXAM: DG HIP (WITH OR WITHOUT PELVIS) 2-3V RIGHT COMPARISON:  None. FINDINGS: Moderately displaced and possibly comminuted fracture is seen involving the proximal right femoral neck. Left hip appears normal. IMPRESSION: Moderately displaced and possibly comminuted proximal right femoral neck fracture. Electronically Signed   By: Marijo Conception, M.D.   On: 12/19/2016 13:30    Echo 11/24  Study Conclusions  - Left ventricle: The cavity size was normal. Wall thickness was   normal. Systolic function was normal. The estimated ejection   fraction was in the range of 60% to 65%. Wall motion was normal;   there were no regional wall motion abnormalities. Doppler   parameters are consistent with abnormal left ventricular   relaxation (grade 1 diastolic dysfunction). - Left atrium: The atrium was mildly dilated. - Atrial septum: There was increased thickness of the septum,   consistent with lipomatous hypertrophy. - Pulmonary arteries: PA peak pressure: 31 mm Hg (S).    Discharge Exam: Vitals:   12/23/16 0534 12/23/16 0800  BP: 132/62 (!) 144/61  Pulse: 77 86  Resp: 16 18  Temp: 98.9 F (37.2 C)   SpO2: 95% 99%   Vitals:   12/22/16 1416 12/22/16 2119 12/23/16 0534  12/23/16 0800  BP: 131/74 117/71 132/62 (!) 144/61  Pulse: 91 83 77 86  Resp:  15 16 18   Temp: 99.1 F (37.3 C) 98.6 F (37 C) 98.9 F (37.2 C)   TempSrc: Oral Oral Oral   SpO2: 95% 96% 95% 99%  Weight:      Height:        General: Pt is alert, awake, not in acute distress Cardiovascular: RRR, S1/S2 +, no rubs, no gallops Respiratory: CTA bilaterally, no wheezing, no rhonchi Abdominal: Soft, NT, ND, bowel sounds + Extremities: no edema, no cyanosis    The results of significant diagnostics from this hospitalization (including imaging, microbiology, ancillary and laboratory) are listed below for reference.     Microbiology: Recent Results (from the past 240 hour(s))  Surgical PCR screen     Status: Abnormal   Collection Time: 12/19/16 10:42 AM  Result Value Ref Range Status   MRSA, PCR POSITIVE (A) NEGATIVE Final   Staphylococcus aureus POSITIVE (A) NEGATIVE Final    Comment: RESULT CALLED TO, READ BACK BY AND VERIFIED WITH: Waylan Rocher AT 4627 12/20/16 BY L BENFIELD (NOTE) The Xpert SA Assay (FDA approved for NASAL specimens in patients 38 years of age and older), is one component of a comprehensive surveillance program. It is not intended to diagnose infection nor to guide or monitor treatment.   Culture, blood (Routine X 2) w Reflex to ID Panel     Status: None (Preliminary result)   Collection Time: 12/19/16  4:38 PM  Result Value Ref Range Status   Specimen Description LEFT ANTECUBITAL  Final   Special Requests   Final    BOTTLES DRAWN AEROBIC AND ANAEROBIC Blood Culture adequate volume   Culture NO GROWTH 4 DAYS  Final  Report Status PENDING  Incomplete  Culture, blood (Routine X 2) w Reflex to ID Panel     Status: Abnormal   Collection Time: 12/19/16  4:38 PM  Result Value Ref Range Status   Specimen Description BLOOD LEFT HAND  Final   Special Requests   Final    BOTTLES DRAWN AEROBIC AND ANAEROBIC Blood Culture adequate volume   Culture  Setup Time    Final    GRAM POSITIVE RODS Gram Stain Report Called to,Read Back By and Verified With: NOTIFIED NURSE JOYCE AT 2542 ON 12/21/2016 BY EVA AEROBIC BOTTLE ONLY Performed at [facility], READ BACK BY AND VERIFIED WITH: E SINCLAIR,PHARMD AT 0922 12/21/16 BY L BENFIELD    Culture (A)  Final    DIPHTHEROIDS(CORYNEBACTERIUM SPECIES) Standardized susceptibility testing for this organism is not available. Performed at [facility], [place] [address], [place], [place] [address]    Report Status 12/23/2016 FINAL  Final  Blood Culture ID Panel (Reflexed)     Status: None   Collection Time: 12/19/16  4:38 PM  Result Value Ref Range Status   Enterococcus species NOT DETECTED NOT DETECTED Final   Listeria monocytogenes NOT DETECTED NOT DETECTED Final   Staphylococcus species NOT DETECTED NOT DETECTED Final   Staphylococcus aureus NOT DETECTED NOT DETECTED Final   Streptococcus species NOT DETECTED NOT DETECTED Final   Streptococcus agalactiae NOT DETECTED NOT DETECTED Final   Streptococcus pneumoniae NOT DETECTED NOT DETECTED Final   Streptococcus pyogenes NOT DETECTED NOT DETECTED Final   Acinetobacter baumannii NOT DETECTED NOT DETECTED Final   Enterobacteriaceae species NOT DETECTED NOT DETECTED Final   Enterobacter cloacae complex NOT DETECTED NOT DETECTED Final   Escherichia coli NOT DETECTED NOT DETECTED Final   Klebsiella oxytoca NOT DETECTED NOT DETECTED Final   Klebsiella pneumoniae NOT DETECTED NOT DETECTED Final   Proteus species NOT DETECTED NOT DETECTED Final   Serratia marcescens NOT DETECTED NOT DETECTED Final   Haemophilus influenzae NOT DETECTED NOT DETECTED Final   Neisseria meningitidis NOT DETECTED NOT DETECTED Final   Pseudomonas aeruginosa NOT DETECTED NOT DETECTED Final   Candida albicans NOT DETECTED NOT DETECTED Final   Candida glabrata NOT DETECTED NOT DETECTED Final   Candida krusei NOT DETECTED NOT DETECTED Final   Candida  parapsilosis NOT DETECTED NOT DETECTED Final   Candida tropicalis NOT DETECTED NOT DETECTED Final    Comment: Performed at [facility], [place] [address] [place] [address], [place], [place] [address]     Labs: BNP (last 3 results) No results for input(s): BNP in the last 8760 hours. Basic Metabolic Panel: Recent Labs  Lab 12/20/16 1036 12/21/16 0358 12/22/16 0431 12/22/16 1020 12/23/16 0617  NA 135 133* 136 137 136  K 3.9 4.1 3.9 4.0 3.7  CL 103 101 103 102 102  CO2 24 24 29 27 26   GLUCOSE 116* 127* 105* 100* 100*  BUN 14 25* 24* 27* 32*  CREATININE 0.88 1.18 0.89 1.13 0.94  CALCIUM 8.5* 8.4* 8.5* 8.4* 8.3*   Liver Function Tests: No results for input(s): AST, ALT, ALKPHOS, BILITOT, PROT, ALBUMIN in the last 168 hours. No results for input(s): LIPASE, AMYLASE in the last 168 hours. No results for input(s): AMMONIA in the last 168 hours. CBC: Recent Labs  Lab 12/19/16 1332 12/20/16 1036 12/21/16 0358 12/22/16 0431 12/22/16 1020 12/23/16 0617  WBC 16.0* 7.4 20.0* 15.3* 21.0* 16.8*  NEUTROABS 11.5*  --   --   --   --   --  HGB 16.3 12.2* 12.5* 12.8* 13.1 12.8*  HCT 50.1 36.2* 38.2* 39.1 40.3 39.6  MCV 93.3 93.3 92.3 91.6 92.9 93.0  PLT 426* 148* 340 344 324 332   Cardiac Enzymes: Recent Labs  Lab 12/19/16 1332  TROPONINI <0.03   BNP: Invalid input(s): POCBNP CBG: No results for input(s): GLUCAP in the last 168 hours. D-Dimer No results for input(s): DDIMER in the last 72 hours. Hgb A1c No results for input(s): HGBA1C in the last 72 hours. Lipid Profile No results for input(s): CHOL, HDL, LDLCALC, TRIG, CHOLHDL, LDLDIRECT in the last 72 hours. Thyroid function studies No results for input(s): TSH, T4TOTAL, T3FREE, THYROIDAB in the last 72 hours.  Invalid input(s): FREET3 Anemia work up No results for input(s): VITAMINB12, FOLATE, FERRITIN, TIBC, IRON, RETICCTPCT in the last 72 hours. Urinalysis    Component Value Date/Time   COLORURINE YELLOW 12/19/2016 1505    APPEARANCEUR CLEAR 12/19/2016 1505   LABSPEC 1.006 12/19/2016 1505   PHURINE 7.0 12/19/2016 1505   GLUCOSEU NEGATIVE 12/19/2016 1505   HGBUR NEGATIVE 12/19/2016 1505   BILIRUBINUR NEGATIVE 12/19/2016 1505   KETONESUR 5 (A) 12/19/2016 1505   PROTEINUR NEGATIVE 12/19/2016 1505   NITRITE NEGATIVE 12/19/2016 1505   LEUKOCYTESUR NEGATIVE 12/19/2016 1505   Sepsis Labs Invalid input(s): PROCALCITONIN,  WBC,  LACTICIDVEN Microbiology Recent Results (from the past 240 hour(s))  Surgical PCR screen     Status: Abnormal   Collection Time: 12/19/16 10:42 AM  Result Value Ref Range Status   MRSA, PCR POSITIVE (A) NEGATIVE Final   Staphylococcus aureus POSITIVE (A) NEGATIVE Final    Comment: RESULT CALLED TO, READ BACK BY AND VERIFIED WITH: Waylan Rocher AT 8676 12/20/16 BY L BENFIELD (NOTE) The Xpert SA Assay (FDA approved for NASAL specimens in patients 22 years of age and older), is one component of a comprehensive surveillance program. It is not intended to diagnose infection nor to guide or monitor treatment.   Culture, blood (Routine X 2) w Reflex to ID Panel     Status: None (Preliminary result)   Collection Time: 12/19/16  4:38 PM  Result Value Ref Range Status   Specimen Description LEFT ANTECUBITAL  Final   Special Requests   Final    BOTTLES DRAWN AEROBIC AND ANAEROBIC Blood Culture adequate volume   Culture NO GROWTH 4 DAYS  Final   Report Status PENDING  Incomplete  Culture, blood (Routine X 2) w Reflex to ID Panel     Status: Abnormal   Collection Time: 12/19/16  4:38 PM  Result Value Ref Range Status   Specimen Description BLOOD LEFT HAND  Final   Special Requests   Final    BOTTLES DRAWN AEROBIC AND ANAEROBIC Blood Culture adequate volume   Culture  Setup Time   Final    GRAM POSITIVE RODS Gram Stain Report Called to,Read Back By and Verified With: NOTIFIED NURSE JOYCE AT 7209 ON 12/21/2016 BY EVA AEROBIC BOTTLE ONLY Performed at [facility], READ BACK BY AND VERIFIED WITH: E SINCLAIR,PHARMD AT 0922 12/21/16 BY L BENFIELD    Culture (A)  Final    DIPHTHEROIDS(CORYNEBACTERIUM SPECIES) Standardized susceptibility testing for this organism is not available. Performed at [facility], [place] [address] [place], [place] [address]    Report Status 12/23/2016 FINAL  Final  Blood Culture ID Panel (Reflexed)     Status: None   Collection Time: 12/19/16  4:38 PM  Result Value Ref Range Status  Enterococcus species NOT DETECTED NOT DETECTED Final   Listeria monocytogenes NOT DETECTED NOT DETECTED Final   Staphylococcus species NOT DETECTED NOT DETECTED Final   Staphylococcus aureus NOT DETECTED NOT DETECTED Final   Streptococcus species NOT DETECTED NOT DETECTED Final   Streptococcus agalactiae NOT DETECTED NOT DETECTED Final   Streptococcus pneumoniae NOT DETECTED NOT DETECTED Final   Streptococcus pyogenes NOT DETECTED NOT DETECTED Final   Acinetobacter baumannii NOT DETECTED NOT DETECTED Final   Enterobacteriaceae species NOT DETECTED NOT DETECTED Final   Enterobacter cloacae complex NOT DETECTED NOT DETECTED Final   Escherichia coli NOT DETECTED NOT DETECTED Final   Klebsiella oxytoca NOT DETECTED NOT DETECTED Final   Klebsiella pneumoniae NOT DETECTED NOT DETECTED Final   Proteus species NOT DETECTED NOT DETECTED Final   Serratia marcescens NOT DETECTED NOT DETECTED Final   Haemophilus influenzae NOT DETECTED NOT DETECTED Final   Neisseria meningitidis NOT DETECTED NOT DETECTED Final   Pseudomonas aeruginosa NOT DETECTED NOT DETECTED Final   Candida albicans NOT DETECTED NOT DETECTED Final   Candida glabrata NOT DETECTED NOT DETECTED Final   Candida krusei NOT DETECTED NOT DETECTED Final   Candida parapsilosis NOT DETECTED NOT DETECTED Final   Candida tropicalis NOT DETECTED NOT DETECTED Final    Comment: Performed at [facility], [address], [place], [place] [address]     Time coordinating discharge:  40 minutes  SIGNED:  Dessa Phi, DO [facility] Pager [phone]  If 7PM-7AM, please contact night-coverage [email or web address] Password [facility] 12/23/2016, 2:14 PM

## 2016-12-23 NOTE — Progress Notes (Signed)
Physical Therapy Treatment Patient Details Name: Bradley [name] MRN: [id number] DOB: 23-May-1940 Today's Date: 12/23/2016    History of Present Illness 76 y.o. s/p Rt hip hemiarthroplasty. PMH includes CAD, HTN, migraine, MI, pneumonia, chronic lower back pain,     PT Comments    Pt progressing towards physical therapy goals. Was able to ambulate with close guard for safety, however somewhat self limiting with distance. Max encouragement was provided for pt to achieve the distance he did last session. Pt was educated on general safety and recommendations for activity progression at home. Encouraged pt to be as independent as possible and not rely on others to move his surgical leg for him during transfers at home. Will continue to benefit from HHPT follow up.  Follow Up Recommendations  DC plan and follow up therapy as arranged by surgeon     Equipment Recommendations  Rolling walker with 5" wheels    Recommendations for Other Services       Precautions / Restrictions Precautions Precautions: Posterior Hip Precaution Booklet Issued: No Precaution Comments: Reviewed precautions verbally with pt Required Braces or Orthoses: Other Brace/Splint Other Brace/Splint: hip abductor pillow Restrictions Weight Bearing Restrictions: Yes RLE Weight Bearing: Weight bearing as tolerated    Mobility  Bed Mobility Overal bed mobility: Needs Assistance Bed Mobility: Supine to Sit     Supine to sit: Supervision     General bed mobility comments: Pt was able to transition to EOB without assistance. Min use of rails for support.   Transfers Overall transfer level: Needs assistance Equipment used: Rolling walker (2 wheeled) Transfers: Sit to/from Stand Sit to Stand: Min guard         General transfer comment: Pt demonstrated proper hand placement on seated surface for safety. No assist to power-up to full standing position.   Ambulation/Gait Ambulation/Gait assistance: Min  guard Ambulation Distance (Feet): 250 Feet Assistive device: Rolling walker (2 wheeled) Gait Pattern/deviations: Step-through pattern;Antalgic;Decreased stance time - right;Trunk flexed Gait velocity: decreased Gait velocity interpretation: Below normal speed for age/gender General Gait Details: VC's for improved posture and bilateral shoulder depression (elevated due to tension throughout upper body). Pt was able to ambulate fairly well with RW with no assist required. Close guard provided throughout gait training for safety.    Stairs            Wheelchair Mobility    Modified Rankin (Stroke Patients Only)       Balance Overall balance assessment: Needs assistance Sitting-balance support: No upper extremity supported;Feet supported Sitting balance-Leahy Scale: Good     Standing balance support: Bilateral upper extremity supported;During functional activity Standing balance-Leahy Scale: Poor Standing balance comment: reliant on RW                             Cognition Arousal/Alertness: Awake/alert Behavior During Therapy: WFL for tasks assessed/performed Overall Cognitive Status: Within Functional Limits for tasks assessed                                 General Comments: Low literacy      Exercises General Exercises - Lower Extremity Ankle Circles/Pumps: 15 reps Quad Sets: 10 reps;Seated Long Arc Quad: 10 reps;Seated Hip ABduction/ADduction: 10 reps;Seated    General Comments        Pertinent Vitals/Pain Pain Assessment: Faces Faces Pain Scale: Hurts little more Pain Location: R hip Pain Descriptors / Indicators:  Aching;Discomfort;Sore Pain Intervention(s): Limited activity within patient's tolerance;Monitored during session;Repositioned    Home Living                      Prior Function            PT Goals (current goals can now be found in the care plan section) Acute Rehab PT Goals Patient Stated Goal: get  back home; drive by Dec 4 PT Goal Formulation: With patient Time For Goal Achievement: 12/28/16 Potential to Achieve Goals: Good Progress towards PT goals: Progressing toward goals    Frequency    7X/week      PT Plan Current plan remains appropriate    Co-evaluation              AM-PAC PT "6 Clicks" Daily Activity  Outcome Measure  Difficulty turning over in bed (including adjusting bedclothes, sheets and blankets)?: A Little Difficulty moving from lying on back to sitting on the side of the bed? : A Little Difficulty sitting down on and standing up from a chair with arms (e.g., wheelchair, bedside commode, etc,.)?: A Little Help needed moving to and from a bed to chair (including a wheelchair)?: A Little Help needed walking in hospital room?: A Little Help needed climbing 3-5 steps with a railing? : A Little 6 Click Score: 18    End of Session Equipment Utilized During Treatment: Gait belt Activity Tolerance: Patient tolerated treatment well Patient left: in bed;with call bell/phone within reach Nurse Communication: Mobility status PT Visit Diagnosis: Difficulty in walking, not elsewhere classified (R26.2)     Time: 2248-2500 PT Time Calculation (min) (ACUTE ONLY): 26 min  Charges:  $Gait Training: 23-37 mins                    G Codes:       Rolinda Roan, PT, DPT Acute Rehabilitation Services Pager: [place] 12/23/2016, 9:59 AM

## 2016-12-23 NOTE — Progress Notes (Signed)
ORTHOPAEDIC PROGRESS NOTE  s/p Procedure(s): ARTHROPLASTY BIPOLAR HIP (HEMIARTHROPLASTY)  SUBJECTIVE: Nausea overnight, hip feels ok.  OBJECTIVE: PE:  [id number] CDI, abduction pillow in place, leg lengths equal, warm well perfused foot, intact EHL/TA/GSC   Vitals:   12/23/16 0534 12/23/16 0800  BP: 132/62 (!) 144/61  Pulse: 77 86  Resp: 16 18  Temp: 98.9 F (37.2 C)   SpO2: 95% 99%     ASSESSMENT: Bradley [name] is a 76 y.o. male doing well postoperatively.  PLAN: Weightbearing: WBAT RLE posterior hip precautions Insicional and dressing care: OK to remove dressings 7 days postop and leave open to air with dry gauze PRN Orthopedic device(s):Abduction pillow while in bed until followup Showering: ok to shower with aquacel on. VTE prophylaxis: Lovenox 40mg  qd 2 weeks, transition to aspirin likely at that point. Pain control: Scheduled APAP, prn oxycodone Follow - up plan: 2 weeks with Ophelia Charter at [facility] information:  ZPHXTAVW 8-5 Ophelia Charter MD [phone], After hours and holidays please check [email or web address] for group call information for [facility]

## 2016-12-23 NOTE — Discharge Instructions (Signed)
Insicional and dressing care: OK to remove dressings 7 days postop and leave open to air with dry gauze PRN Orthopedic device(s):Abduction pillow while in bed until followup Showering: ok to shower with aquacel on.

## 2016-12-24 DIAGNOSIS — Z7951 Long term (current) use of inhaled steroids: Secondary | ICD-10-CM | POA: Diagnosis not present

## 2016-12-24 DIAGNOSIS — I2510 Atherosclerotic heart disease of native coronary artery without angina pectoris: Secondary | ICD-10-CM | POA: Diagnosis not present

## 2016-12-24 DIAGNOSIS — Z791 Long term (current) use of non-steroidal anti-inflammatories (NSAID): Secondary | ICD-10-CM | POA: Diagnosis not present

## 2016-12-24 DIAGNOSIS — I252 Old myocardial infarction: Secondary | ICD-10-CM | POA: Diagnosis not present

## 2016-12-24 DIAGNOSIS — I5022 Chronic systolic (congestive) heart failure: Secondary | ICD-10-CM | POA: Diagnosis not present

## 2016-12-24 DIAGNOSIS — R69 Illness, unspecified: Secondary | ICD-10-CM | POA: Diagnosis not present

## 2016-12-24 DIAGNOSIS — S72001D Fracture of unspecified part of neck of right femur, subsequent encounter for closed fracture with routine healing: Secondary | ICD-10-CM | POA: Diagnosis not present

## 2016-12-24 DIAGNOSIS — Z7982 Long term (current) use of aspirin: Secondary | ICD-10-CM | POA: Diagnosis not present

## 2016-12-24 DIAGNOSIS — I110 Hypertensive heart disease with heart failure: Secondary | ICD-10-CM | POA: Diagnosis not present

## 2016-12-24 DIAGNOSIS — J439 Emphysema, unspecified: Secondary | ICD-10-CM | POA: Diagnosis not present

## 2016-12-24 LAB — CULTURE, BLOOD (ROUTINE X 2)
CULTURE: NO GROWTH
Special Requests: ADEQUATE

## 2016-12-25 DIAGNOSIS — Z7951 Long term (current) use of inhaled steroids: Secondary | ICD-10-CM | POA: Diagnosis not present

## 2016-12-25 DIAGNOSIS — R69 Illness, unspecified: Secondary | ICD-10-CM | POA: Diagnosis not present

## 2016-12-25 DIAGNOSIS — S72001D Fracture of unspecified part of neck of right femur, subsequent encounter for closed fracture with routine healing: Secondary | ICD-10-CM | POA: Diagnosis not present

## 2016-12-25 DIAGNOSIS — J439 Emphysema, unspecified: Secondary | ICD-10-CM | POA: Diagnosis not present

## 2016-12-25 DIAGNOSIS — I110 Hypertensive heart disease with heart failure: Secondary | ICD-10-CM | POA: Diagnosis not present

## 2016-12-25 DIAGNOSIS — Z791 Long term (current) use of non-steroidal anti-inflammatories (NSAID): Secondary | ICD-10-CM | POA: Diagnosis not present

## 2016-12-25 DIAGNOSIS — I5022 Chronic systolic (congestive) heart failure: Secondary | ICD-10-CM | POA: Diagnosis not present

## 2016-12-25 DIAGNOSIS — Z7982 Long term (current) use of aspirin: Secondary | ICD-10-CM | POA: Diagnosis not present

## 2016-12-25 DIAGNOSIS — I2510 Atherosclerotic heart disease of native coronary artery without angina pectoris: Secondary | ICD-10-CM | POA: Diagnosis not present

## 2016-12-25 DIAGNOSIS — I252 Old myocardial infarction: Secondary | ICD-10-CM | POA: Diagnosis not present

## 2016-12-25 NOTE — Progress Notes (Signed)
Cardiology Office Note   Date:  12/26/2016   ID:  Tc Kapusta, DOB 01/22/1941, MRN [id number]  PCP:  Jani Gravel, MD  Cardiologist:  Kate Sable, MD  Chief Complaint  Patient presents with  . Coronary Artery Disease  . Congestive Heart Failure     History of Present Illness: Bradley [name] is a 76 y.o. male who presents for ongoing assessment and management of coronary artery disease status post hospitalization for chest pain where we saw him on consultation August 2018.  The patient was transferred to [facility] for cardiac catheterization . Other history includes systolic dysfunction, CHF, and tobacco abuse.   Conclusion     2nd Mrg lesion, 30 %stenosed.  The left ventricular systolic function is normal.  LV end diastolic pressure is normal.  The left ventricular ejection fraction is 45-50% by visual estimate.  There is no mitral valve regurgitation.   1. Mild non-obstructive CAD 2. Low normal LV systolic function with mild hypokinesis of the apical wall.  3. Normal filling pressures   Was advised on smoking cessation, and continued risk factor modification.  Since being seen last, the patient has fallen and broken his right hip.  He did this by tripping when he reached to pick up a cup for his granddaughter.  He was seen by Dr. Griffin Basil, and had arthroplasty and bipolar hip humeral arthroplasty, dated 12/20/2016.  Continues to use a walker for ambulation.  He goes to physical rehab.  Past Medical History:  Diagnosis Date  . Chronic lower back pain   . Contact with stonefish as cause of accidental injury 1954   "stung across my left hand in [place]; LUE weaker since"  . Coronary artery disease   . High cholesterol   . History of blood transfusion 1943  . Hypertension   . Migraine    "none since the 1990s" (09/11/2016)  . Myocardial infarction ([place]) 01/20/1993; 02/28/1993  . Pneumonia    "several times" (09/11/2016)    Past Surgical History:  Procedure  Laterality Date  . APPENDECTOMY    . CARDIAC CATHETERIZATION    . CATARACT EXTRACTION, BILATERAL Bilateral   . HIP ARTHROPLASTY Right 12/20/2016   Procedure: ARTHROPLASTY BIPOLAR HIP (HEMIARTHROPLASTY);  Surgeon: [name], MD;  Location: [facility];  Service: Orthopedics;  Laterality: Right;  . JOINT REPLACEMENT    . LEFT HEART CATH AND CORONARY ANGIOGRAPHY N/A 09/12/2016   Procedure: LEFT HEART CATH AND CORONARY ANGIOGRAPHY;  Surgeon: Burnell Blanks, MD;  Location: [facility];  Service: Cardiovascular;  Laterality: N/A;  . NASAL ENDOSCOPY WITH EPISTAXIS CONTROL Right 03/15/2016   Procedure: NASAL ENDOSCOPY WITH EPISTAXIS CONTROL;  Surgeon: Jodi Marble, MD;  Location: [facility];  Service: ENT;  Laterality: Right;  . SEPTOPLASTY Right 03/15/2016   Procedure: SEPTOPLASTY;  Surgeon: Jodi Marble, MD;  Location: [facility];  Service: ENT;  Laterality: Right;  . SINUS EXPLORATION Right 03/15/2016   Procedure: SINUS EXPLORATION;  Surgeon: Jodi Marble, MD;  Location: [facility];  Service: ENT;  Laterality: Right;  . TONSILLECTOMY    . TOOTH EXTRACTION    . TOTAL KNEE ARTHROPLASTY Left      Current Outpatient Medications  Medication Sig Dispense Refill  . acetaminophen (TYLENOL) 500 MG tablet Take 2 tablets (1,000 mg total) by mouth every 8 (eight) hours for 14 days. 84 tablet 0  . ADVAIR HFA 115-21 MCG/ACT inhaler Inhale 2 puffs into the lungs every 12 (twelve) hours.  4  . amLODipine (NORVASC) 5 MG  tablet Take 1 tablet (5 mg total) by mouth daily. 30 tablet 2  . aspirin EC 81 MG tablet Take 1 tablet by mouth daily.    . celecoxib (CELEBREX) 200 MG capsule Take 1 capsule (200 mg total) by mouth daily. 30 capsule 1  . enoxaparin (LOVENOX) 40 MG/0.4ML injection Inject 0.4 mLs (40 mg total) into the skin daily. 14 Syringe 1  . Ferrous Sulfate (IRON) 325 (65 Fe) MG TABS Take 1 tablet by mouth daily.    [name] gabapentin (NEURONTIN) 300 MG capsule Take 1 capsule by mouth 3 (three) times daily.  2    . lisinopril (PRINIVIL,ZESTRIL) 10 MG tablet Take 1 tablet (10 mg total) by mouth daily. 30 tablet 2  . metoCLOPramide (REGLAN) 5 MG tablet Take 1 tablet by mouth 4 (four) times daily -  before meals and at bedtime.  5  . nitroGLYCERIN (NITROSTAT) 0.4 MG SL tablet Place 0.4 mg under the tongue every 5 (five) minutes as needed for chest pain.    [name] omeprazole (PRILOSEC) 20 MG capsule Take 40 mg by mouth daily.     [name] oxyCODONE (OXY IR/ROXICODONE) 5 MG immediate release tablet Take 1-2 pills every 6 hrs as needed for pain 30 tablet 0  . polyethylene glycol (MIRALAX / GLYCOLAX) packet Take 17 g by mouth daily as needed for mild constipation. 14 each 0  . simvastatin (ZOCOR) 10 MG tablet Take 10 mg by mouth daily.  3  . VENTOLIN HFA 108 (90 Base) MCG/ACT inhaler Inhale 2 puffs into the lungs every 4 (four) hours as needed for wheezing or shortness of breath.   3   No current facility-administered medications for this visit.     Allergies:   Patient has no known allergies.    Social History:  The patient  reports that he has been smoking cigarettes.  He has a 68.00 pack-year smoking history. he has never used smokeless tobacco. He reports that he does not drink alcohol or use drugs.   Family History:  The patient's family history includes Cancer in his mother; Obesity in his father. He was adopted.    ROS: All other systems are reviewed and negative. Unless otherwise mentioned in H&P    PHYSICAL EXAM: VS:  BP 132/74   Pulse 86   Wt 147 lb (66.7 kg)   SpO2 98%   BMI 19.94 kg/m  , BMI Body mass index is 19.94 kg/m. GEN: Well nourished, well developed, in no acute distress heavy tobacco odor. HEENT: normal  Neck: no JVD, carotid bruits, or masses Cardiac: RRR; no murmurs, rubs, or gallops,no edema  Respiratory:  clear to auscultation bilaterally, normal work of breathing GI: soft, nontender, nondistended, + BS MS: no deformity or atrophy ambulates with walker mild limp on the  right. Skin: warm and dry, no rash Neuro:  Strength and sensation are intact Psych: euthymic mood, full affect   Recent Labs: 09/11/2016: ALT 14 12/23/2016: BUN 32; Creatinine, Ser 0.94; Hemoglobin 12.8; Platelets 332; Potassium 3.7; Sodium 136    Lipid Panel    Component Value Date/Time   CHOL 107 09/12/2016 0412   TRIG 87 09/12/2016 0412   HDL 36 (L) 09/12/2016 0412   CHOLHDL 3.0 09/12/2016 0412   VLDL 17 09/12/2016 0412   LDLCALC 54 09/12/2016 0412      Wt Readings from Last 3 Encounters:  12/26/16 147 lb (66.7 kg)  12/19/16 145 lb (65.8 kg)  09/25/16 152 lb (68.9 kg)      Other  studies Reviewed:  Echocardiogram 12/20/2016 Left ventricle: The cavity size was normal. Wall thickness was   normal. Systolic function was normal. The estimated ejection   fraction was in the range of 60% to 65%. Wall motion was normal;   there were no regional wall motion abnormalities. Doppler   parameters are consistent with abnormal left ventricular   relaxation (grade 1 diastolic dysfunction). - Left atrium: The atrium was mildly dilated. - Atrial septum: There was increased thickness of the septum,   consistent with lipomatous hypertrophy. - Pulmonary arteries: PA peak pressure: 31 mm Hg (S).   ASSESSMENT AND PLAN:  1.  Chest pain: None limiting.  Not found to be related to ischemia.  Cardiac catheterization as above.  Patient will continue current medication regimen.  We will not make any changes at this time.  He is due for follow-up with PCP next week.  Lab labs for ongoing health management will be drawn at that time.  2.  Coronary artery disease: Nonobstructive for cardiac catheterization.  She will continue secondary prevention with statin therapy.  He is also been advised on smoking cessation.  3.  Hypertension: Remains on lisinopril 10 mg daily, amlodipine 5 mg daily, and is well controlled currently.  4.  Recent right femoral neck fracture: Mechanical fall with surgical  repair on 12/20/2016.  He continues to use a walker for ambulation.  He has physical therapy.  He states he is "getting along better"  5.  Ongoing tobacco abuse: I have talked to the patient about cessation.  He states he has dropped from 4 packs a day to 1 pack a day.  He rolls his own cigarettes using pipe tobacco.  States he will probably never completely quit.  He understands the risks.  Current medicines are reviewed at length with the patient today.    Labs/ tests ordered today include:  Phill Myron. [place] Pugh, ANP, AACC   12/26/2016 1:23 PM    [facility] [address] [place], [place] [address] Phone: [phone]; Fax: [phone]

## 2016-12-26 ENCOUNTER — Ambulatory Visit (INDEPENDENT_AMBULATORY_CARE_PROVIDER_SITE_OTHER): Payer: Medicare HMO | Admitting: Adult Health

## 2016-12-26 ENCOUNTER — Encounter: Payer: Self-pay | Admitting: Adult Health

## 2016-12-26 VITALS — BP 132/74 | HR 86 | Wt 147.0 lb

## 2016-12-26 DIAGNOSIS — Z720 Tobacco use: Secondary | ICD-10-CM | POA: Diagnosis not present

## 2016-12-26 DIAGNOSIS — I2510 Atherosclerotic heart disease of native coronary artery without angina pectoris: Secondary | ICD-10-CM

## 2016-12-26 DIAGNOSIS — I10 Essential (primary) hypertension: Secondary | ICD-10-CM

## 2016-12-26 NOTE — Patient Instructions (Signed)
Medication Instructions:  Your physician recommends that you continue on your current medications as directed. Please refer to the Current Medication list given to you today.   Labwork: NONE   Testing/Procedures: NONE   Follow-Up: Your physician wants you to follow-up in: 6 Months.  You will receive a reminder letter in the mail two months in advance. If you don't receive a letter, please call our office to schedule the follow-up appointment.   Any Other Special Instructions Will Be Listed Below (If Applicable).     If you need a refill on your cardiac medications before your next appointment, please call your pharmacy. Thank you for choosing [facility]!

## 2016-12-29 DIAGNOSIS — I252 Old myocardial infarction: Secondary | ICD-10-CM | POA: Diagnosis not present

## 2016-12-29 DIAGNOSIS — J439 Emphysema, unspecified: Secondary | ICD-10-CM | POA: Diagnosis not present

## 2016-12-29 DIAGNOSIS — I110 Hypertensive heart disease with heart failure: Secondary | ICD-10-CM | POA: Diagnosis not present

## 2016-12-29 DIAGNOSIS — I2510 Atherosclerotic heart disease of native coronary artery without angina pectoris: Secondary | ICD-10-CM | POA: Diagnosis not present

## 2016-12-29 DIAGNOSIS — S72001D Fracture of unspecified part of neck of right femur, subsequent encounter for closed fracture with routine healing: Secondary | ICD-10-CM | POA: Diagnosis not present

## 2016-12-29 DIAGNOSIS — Z7951 Long term (current) use of inhaled steroids: Secondary | ICD-10-CM | POA: Diagnosis not present

## 2016-12-29 DIAGNOSIS — I5022 Chronic systolic (congestive) heart failure: Secondary | ICD-10-CM | POA: Diagnosis not present

## 2016-12-29 DIAGNOSIS — Z7982 Long term (current) use of aspirin: Secondary | ICD-10-CM | POA: Diagnosis not present

## 2016-12-29 DIAGNOSIS — Z791 Long term (current) use of non-steroidal anti-inflammatories (NSAID): Secondary | ICD-10-CM | POA: Diagnosis not present

## 2016-12-29 DIAGNOSIS — R69 Illness, unspecified: Secondary | ICD-10-CM | POA: Diagnosis not present

## 2016-12-30 DIAGNOSIS — Z79899 Other long term (current) drug therapy: Secondary | ICD-10-CM | POA: Diagnosis not present

## 2016-12-30 DIAGNOSIS — I2510 Atherosclerotic heart disease of native coronary artery without angina pectoris: Secondary | ICD-10-CM | POA: Diagnosis not present

## 2016-12-30 DIAGNOSIS — M545 Low back pain: Secondary | ICD-10-CM | POA: Diagnosis not present

## 2016-12-30 DIAGNOSIS — I252 Old myocardial infarction: Secondary | ICD-10-CM | POA: Diagnosis not present

## 2016-12-30 DIAGNOSIS — Z7982 Long term (current) use of aspirin: Secondary | ICD-10-CM | POA: Diagnosis not present

## 2016-12-30 DIAGNOSIS — Z791 Long term (current) use of non-steroidal anti-inflammatories (NSAID): Secondary | ICD-10-CM | POA: Diagnosis not present

## 2016-12-30 DIAGNOSIS — I5022 Chronic systolic (congestive) heart failure: Secondary | ICD-10-CM | POA: Diagnosis not present

## 2016-12-30 DIAGNOSIS — M25551 Pain in right hip: Secondary | ICD-10-CM | POA: Diagnosis not present

## 2016-12-30 DIAGNOSIS — I110 Hypertensive heart disease with heart failure: Secondary | ICD-10-CM | POA: Diagnosis not present

## 2016-12-30 DIAGNOSIS — Z7951 Long term (current) use of inhaled steroids: Secondary | ICD-10-CM | POA: Diagnosis not present

## 2016-12-30 DIAGNOSIS — R69 Illness, unspecified: Secondary | ICD-10-CM | POA: Diagnosis not present

## 2016-12-30 DIAGNOSIS — S72001D Fracture of unspecified part of neck of right femur, subsequent encounter for closed fracture with routine healing: Secondary | ICD-10-CM | POA: Diagnosis not present

## 2016-12-30 DIAGNOSIS — J439 Emphysema, unspecified: Secondary | ICD-10-CM | POA: Diagnosis not present

## 2016-12-31 DIAGNOSIS — Z7951 Long term (current) use of inhaled steroids: Secondary | ICD-10-CM | POA: Diagnosis not present

## 2016-12-31 DIAGNOSIS — Z7982 Long term (current) use of aspirin: Secondary | ICD-10-CM | POA: Diagnosis not present

## 2016-12-31 DIAGNOSIS — S72001D Fracture of unspecified part of neck of right femur, subsequent encounter for closed fracture with routine healing: Secondary | ICD-10-CM | POA: Diagnosis not present

## 2016-12-31 DIAGNOSIS — I2510 Atherosclerotic heart disease of native coronary artery without angina pectoris: Secondary | ICD-10-CM | POA: Diagnosis not present

## 2016-12-31 DIAGNOSIS — J439 Emphysema, unspecified: Secondary | ICD-10-CM | POA: Diagnosis not present

## 2016-12-31 DIAGNOSIS — R69 Illness, unspecified: Secondary | ICD-10-CM | POA: Diagnosis not present

## 2016-12-31 DIAGNOSIS — Z791 Long term (current) use of non-steroidal anti-inflammatories (NSAID): Secondary | ICD-10-CM | POA: Diagnosis not present

## 2016-12-31 DIAGNOSIS — I110 Hypertensive heart disease with heart failure: Secondary | ICD-10-CM | POA: Diagnosis not present

## 2016-12-31 DIAGNOSIS — I252 Old myocardial infarction: Secondary | ICD-10-CM | POA: Diagnosis not present

## 2016-12-31 DIAGNOSIS — I5022 Chronic systolic (congestive) heart failure: Secondary | ICD-10-CM | POA: Diagnosis not present

## 2017-01-01 DIAGNOSIS — S72001D Fracture of unspecified part of neck of right femur, subsequent encounter for closed fracture with routine healing: Secondary | ICD-10-CM | POA: Diagnosis not present

## 2017-01-07 ENCOUNTER — Other Ambulatory Visit: Payer: Self-pay | Admitting: Physician Assistant

## 2017-01-07 DIAGNOSIS — Z791 Long term (current) use of non-steroidal anti-inflammatories (NSAID): Secondary | ICD-10-CM | POA: Diagnosis not present

## 2017-01-07 DIAGNOSIS — R69 Illness, unspecified: Secondary | ICD-10-CM | POA: Diagnosis not present

## 2017-01-07 DIAGNOSIS — I110 Hypertensive heart disease with heart failure: Secondary | ICD-10-CM | POA: Diagnosis not present

## 2017-01-07 DIAGNOSIS — I5022 Chronic systolic (congestive) heart failure: Secondary | ICD-10-CM | POA: Diagnosis not present

## 2017-01-07 DIAGNOSIS — Z7982 Long term (current) use of aspirin: Secondary | ICD-10-CM | POA: Diagnosis not present

## 2017-01-07 DIAGNOSIS — I2510 Atherosclerotic heart disease of native coronary artery without angina pectoris: Secondary | ICD-10-CM | POA: Diagnosis not present

## 2017-01-07 DIAGNOSIS — Z7951 Long term (current) use of inhaled steroids: Secondary | ICD-10-CM | POA: Diagnosis not present

## 2017-01-07 DIAGNOSIS — S72001D Fracture of unspecified part of neck of right femur, subsequent encounter for closed fracture with routine healing: Secondary | ICD-10-CM | POA: Diagnosis not present

## 2017-01-07 DIAGNOSIS — J439 Emphysema, unspecified: Secondary | ICD-10-CM | POA: Diagnosis not present

## 2017-01-07 DIAGNOSIS — I252 Old myocardial infarction: Secondary | ICD-10-CM | POA: Diagnosis not present

## 2017-01-21 DIAGNOSIS — S72009A Fracture of unspecified part of neck of unspecified femur, initial encounter for closed fracture: Secondary | ICD-10-CM | POA: Diagnosis not present

## 2017-01-30 DIAGNOSIS — S72001D Fracture of unspecified part of neck of right femur, subsequent encounter for closed fracture with routine healing: Secondary | ICD-10-CM | POA: Diagnosis not present

## 2017-02-03 DIAGNOSIS — M25551 Pain in right hip: Secondary | ICD-10-CM | POA: Diagnosis not present

## 2017-02-03 DIAGNOSIS — M545 Low back pain: Secondary | ICD-10-CM | POA: Diagnosis not present

## 2017-02-03 DIAGNOSIS — R202 Paresthesia of skin: Secondary | ICD-10-CM | POA: Diagnosis not present

## 2017-02-03 DIAGNOSIS — Z79899 Other long term (current) drug therapy: Secondary | ICD-10-CM | POA: Diagnosis not present

## 2017-02-03 DIAGNOSIS — Z79891 Long term (current) use of opiate analgesic: Secondary | ICD-10-CM | POA: Diagnosis not present

## 2017-02-21 DIAGNOSIS — S72009A Fracture of unspecified part of neck of unspecified femur, initial encounter for closed fracture: Secondary | ICD-10-CM | POA: Diagnosis not present

## 2017-03-03 DIAGNOSIS — Z125 Encounter for screening for malignant neoplasm of prostate: Secondary | ICD-10-CM | POA: Diagnosis not present

## 2017-03-03 DIAGNOSIS — Z79899 Other long term (current) drug therapy: Secondary | ICD-10-CM | POA: Diagnosis not present

## 2017-03-03 DIAGNOSIS — E559 Vitamin D deficiency, unspecified: Secondary | ICD-10-CM | POA: Diagnosis not present

## 2017-03-03 DIAGNOSIS — Z13228 Encounter for screening for other metabolic disorders: Secondary | ICD-10-CM | POA: Diagnosis not present

## 2017-03-03 DIAGNOSIS — E785 Hyperlipidemia, unspecified: Secondary | ICD-10-CM | POA: Diagnosis not present

## 2017-03-03 DIAGNOSIS — Z9189 Other specified personal risk factors, not elsewhere classified: Secondary | ICD-10-CM | POA: Diagnosis not present

## 2017-03-03 DIAGNOSIS — I10 Essential (primary) hypertension: Secondary | ICD-10-CM | POA: Diagnosis not present

## 2017-03-06 DIAGNOSIS — Z0001 Encounter for general adult medical examination with abnormal findings: Secondary | ICD-10-CM | POA: Diagnosis not present

## 2017-03-06 DIAGNOSIS — I10 Essential (primary) hypertension: Secondary | ICD-10-CM | POA: Diagnosis not present

## 2017-03-06 DIAGNOSIS — K219 Gastro-esophageal reflux disease without esophagitis: Secondary | ICD-10-CM | POA: Diagnosis not present

## 2017-03-06 DIAGNOSIS — R69 Illness, unspecified: Secondary | ICD-10-CM | POA: Diagnosis not present

## 2017-03-06 DIAGNOSIS — E785 Hyperlipidemia, unspecified: Secondary | ICD-10-CM | POA: Diagnosis not present

## 2017-03-06 DIAGNOSIS — R202 Paresthesia of skin: Secondary | ICD-10-CM | POA: Diagnosis not present

## 2017-03-06 DIAGNOSIS — E559 Vitamin D deficiency, unspecified: Secondary | ICD-10-CM | POA: Diagnosis not present

## 2017-03-06 DIAGNOSIS — M545 Low back pain: Secondary | ICD-10-CM | POA: Diagnosis not present

## 2017-03-06 DIAGNOSIS — J441 Chronic obstructive pulmonary disease with (acute) exacerbation: Secondary | ICD-10-CM | POA: Diagnosis not present

## 2017-03-15 ENCOUNTER — Other Ambulatory Visit: Payer: Self-pay | Admitting: Physician Assistant

## 2017-03-24 DIAGNOSIS — S72009A Fracture of unspecified part of neck of unspecified femur, initial encounter for closed fracture: Secondary | ICD-10-CM | POA: Diagnosis not present

## 2017-04-03 DIAGNOSIS — M25562 Pain in left knee: Secondary | ICD-10-CM | POA: Diagnosis not present

## 2017-04-03 DIAGNOSIS — M545 Low back pain: Secondary | ICD-10-CM | POA: Diagnosis not present

## 2017-04-03 DIAGNOSIS — Z79891 Long term (current) use of opiate analgesic: Secondary | ICD-10-CM | POA: Diagnosis not present

## 2017-04-03 DIAGNOSIS — Z79899 Other long term (current) drug therapy: Secondary | ICD-10-CM | POA: Diagnosis not present

## 2017-04-03 DIAGNOSIS — M25551 Pain in right hip: Secondary | ICD-10-CM | POA: Diagnosis not present

## 2017-04-03 DIAGNOSIS — R69 Illness, unspecified: Secondary | ICD-10-CM | POA: Diagnosis not present

## 2017-04-05 ENCOUNTER — Other Ambulatory Visit: Payer: Self-pay | Admitting: Physician Assistant

## 2017-04-09 ENCOUNTER — Other Ambulatory Visit: Payer: Self-pay | Admitting: Physician Assistant

## 2017-04-15 ENCOUNTER — Other Ambulatory Visit: Payer: Self-pay

## 2017-04-15 MED ORDER — AMLODIPINE BESYLATE 5 MG PO TABS: 5.0000 mg | ORAL_TABLET | Freq: Every day | ORAL | 1 refills | Status: DC

## 2017-04-15 MED ORDER — LISINOPRIL 10 MG PO TABS: 10.0000 mg | ORAL_TABLET | Freq: Every day | ORAL | 3 refills | Status: DC

## 2017-04-19 ENCOUNTER — Other Ambulatory Visit: Payer: Self-pay | Admitting: Physician Assistant

## 2017-04-21 DIAGNOSIS — S72009A Fracture of unspecified part of neck of unspecified femur, initial encounter for closed fracture: Secondary | ICD-10-CM | POA: Diagnosis not present

## 2017-05-04 ENCOUNTER — Ambulatory Visit (HOSPITAL_COMMUNITY)
Admission: RE | Admit: 2017-05-04 | Discharge: 2017-05-04 | Disposition: A | Discharge status: Home / Self Care | Payer: Medicare HMO | Source: Ambulatory Visit | Attending: Family Medicine | Admitting: Family Medicine

## 2017-05-04 ENCOUNTER — Other Ambulatory Visit (HOSPITAL_COMMUNITY): Payer: Self-pay | Admitting: Family Medicine

## 2017-05-04 DIAGNOSIS — M5136 Other intervertebral disc degeneration, lumbar region: Secondary | ICD-10-CM | POA: Insufficient documentation

## 2017-05-04 DIAGNOSIS — M4854XA Collapsed vertebra, not elsewhere classified, thoracic region, initial encounter for fracture: Secondary | ICD-10-CM | POA: Insufficient documentation

## 2017-05-04 DIAGNOSIS — R52 Pain, unspecified: Secondary | ICD-10-CM

## 2017-05-04 DIAGNOSIS — M8588 Other specified disorders of bone density and structure, other site: Secondary | ICD-10-CM | POA: Diagnosis not present

## 2017-05-04 DIAGNOSIS — M545 Low back pain: Secondary | ICD-10-CM | POA: Diagnosis present

## 2017-05-04 DIAGNOSIS — Z79899 Other long term (current) drug therapy: Secondary | ICD-10-CM | POA: Diagnosis not present

## 2017-05-04 DIAGNOSIS — M25551 Pain in right hip: Secondary | ICD-10-CM | POA: Diagnosis not present

## 2017-05-04 DIAGNOSIS — Z79891 Long term (current) use of opiate analgesic: Secondary | ICD-10-CM | POA: Diagnosis not present

## 2017-05-04 DIAGNOSIS — M25562 Pain in left knee: Secondary | ICD-10-CM | POA: Diagnosis not present

## 2017-05-14 ENCOUNTER — Other Ambulatory Visit: Payer: Self-pay

## 2017-05-14 ENCOUNTER — Encounter (HOSPITAL_COMMUNITY): Payer: Self-pay

## 2017-05-14 ENCOUNTER — Emergency Department (HOSPITAL_COMMUNITY)
Admission: EM | Admit: 2017-05-14 | Discharge: 2017-05-15 | Disposition: A | Discharge status: Home / Self Care | Payer: Medicare HMO | Attending: Emergency Medicine | Admitting: Emergency Medicine

## 2017-05-14 DIAGNOSIS — Z7982 Long term (current) use of aspirin: Secondary | ICD-10-CM | POA: Insufficient documentation

## 2017-05-14 DIAGNOSIS — Z79899 Other long term (current) drug therapy: Secondary | ICD-10-CM | POA: Insufficient documentation

## 2017-05-14 DIAGNOSIS — R040 Epistaxis: Secondary | ICD-10-CM | POA: Diagnosis present

## 2017-05-14 DIAGNOSIS — F17210 Nicotine dependence, cigarettes, uncomplicated: Secondary | ICD-10-CM | POA: Insufficient documentation

## 2017-05-14 DIAGNOSIS — Z96652 Presence of left artificial knee joint: Secondary | ICD-10-CM | POA: Insufficient documentation

## 2017-05-14 DIAGNOSIS — Z96641 Presence of right artificial hip joint: Secondary | ICD-10-CM | POA: Insufficient documentation

## 2017-05-14 DIAGNOSIS — I2510 Atherosclerotic heart disease of native coronary artery without angina pectoris: Secondary | ICD-10-CM | POA: Diagnosis not present

## 2017-05-14 DIAGNOSIS — I10 Essential (primary) hypertension: Secondary | ICD-10-CM | POA: Diagnosis not present

## 2017-05-14 DIAGNOSIS — R69 Illness, unspecified: Secondary | ICD-10-CM | POA: Diagnosis not present

## 2017-05-14 NOTE — ED Notes (Signed)
Pt denies nausea at this time.  Pt given cup of water

## 2017-05-14 NOTE — ED Triage Notes (Signed)
Pt arrives via [facility] with nosebleed x 30 minutes

## 2017-05-15 DIAGNOSIS — R69 Illness, unspecified: Secondary | ICD-10-CM | POA: Diagnosis not present

## 2017-05-15 DIAGNOSIS — Z96641 Presence of right artificial hip joint: Secondary | ICD-10-CM | POA: Diagnosis not present

## 2017-05-15 DIAGNOSIS — I10 Essential (primary) hypertension: Secondary | ICD-10-CM | POA: Diagnosis not present

## 2017-05-15 DIAGNOSIS — Z96652 Presence of left artificial knee joint: Secondary | ICD-10-CM | POA: Diagnosis not present

## 2017-05-15 DIAGNOSIS — R040 Epistaxis: Secondary | ICD-10-CM | POA: Diagnosis not present

## 2017-05-15 DIAGNOSIS — Z79899 Other long term (current) drug therapy: Secondary | ICD-10-CM | POA: Diagnosis not present

## 2017-05-15 DIAGNOSIS — I2510 Atherosclerotic heart disease of native coronary artery without angina pectoris: Secondary | ICD-10-CM | POA: Diagnosis not present

## 2017-05-15 DIAGNOSIS — Z7982 Long term (current) use of aspirin: Secondary | ICD-10-CM | POA: Diagnosis not present

## 2017-05-15 MED ORDER — BACITRACIN ZINC 500 UNIT/GM EX OINT
TOPICAL_OINTMENT | CUTANEOUS | Status: AC
  Administered 2017-05-15
  Filled 2017-05-15: qty 0.9

## 2017-05-15 NOTE — Discharge Instructions (Addendum)
Apply ice pack, pressure, antibiotic ointment, saline drops, vaporizer in your bedroom.  If bleeding returns, apply firm pressure.  No sneezing or bending over.  Follow-up with your ENT doctor.

## 2017-05-16 ENCOUNTER — Encounter (HOSPITAL_COMMUNITY): Payer: Self-pay

## 2017-05-16 ENCOUNTER — Emergency Department (HOSPITAL_COMMUNITY)
Admission: EM | Admit: 2017-05-16 | Discharge: 2017-05-16 | Disposition: A | Discharge status: Home / Self Care | Payer: Medicare HMO | Attending: Emergency Medicine | Admitting: Emergency Medicine

## 2017-05-16 ENCOUNTER — Other Ambulatory Visit: Payer: Self-pay

## 2017-05-16 DIAGNOSIS — F17210 Nicotine dependence, cigarettes, uncomplicated: Secondary | ICD-10-CM | POA: Diagnosis not present

## 2017-05-16 DIAGNOSIS — R69 Illness, unspecified: Secondary | ICD-10-CM | POA: Diagnosis not present

## 2017-05-16 DIAGNOSIS — R42 Dizziness and giddiness: Secondary | ICD-10-CM | POA: Diagnosis not present

## 2017-05-16 DIAGNOSIS — Z79899 Other long term (current) drug therapy: Secondary | ICD-10-CM | POA: Insufficient documentation

## 2017-05-16 DIAGNOSIS — M545 Low back pain: Secondary | ICD-10-CM | POA: Diagnosis not present

## 2017-05-16 DIAGNOSIS — G8929 Other chronic pain: Secondary | ICD-10-CM | POA: Insufficient documentation

## 2017-05-16 DIAGNOSIS — R51 Headache: Secondary | ICD-10-CM | POA: Diagnosis not present

## 2017-05-16 DIAGNOSIS — I2510 Atherosclerotic heart disease of native coronary artery without angina pectoris: Secondary | ICD-10-CM | POA: Insufficient documentation

## 2017-05-16 DIAGNOSIS — R040 Epistaxis: Secondary | ICD-10-CM | POA: Diagnosis present

## 2017-05-16 DIAGNOSIS — I10 Essential (primary) hypertension: Secondary | ICD-10-CM | POA: Insufficient documentation

## 2017-05-16 DIAGNOSIS — Z7982 Long term (current) use of aspirin: Secondary | ICD-10-CM | POA: Diagnosis not present

## 2017-05-16 LAB — CBC
HCT: 40.6 % (ref 39.0–52.0)
HEMOGLOBIN: 12.9 g/dL — AB (ref 13.0–17.0)
MCH: 28.5 pg (ref 26.0–34.0)
MCHC: 31.8 g/dL (ref 30.0–36.0)
MCV: 89.6 fL (ref 78.0–100.0)
Platelets: 536 10*3/uL — ABNORMAL HIGH (ref 150–400)
RBC: 4.53 MIL/uL (ref 4.22–5.81)
RDW: 17 % — ABNORMAL HIGH (ref 11.5–15.5)
WBC: 10.1 10*3/uL (ref 4.0–10.5)

## 2017-05-16 MED ORDER — TRANEXAMIC ACID 1000 MG/10ML IV SOLN
500.0000 mg | Freq: Once | INTRAVENOUS | Status: AC
  Administered 2017-05-16: 500 mg via TOPICAL
  Filled 2017-05-16: qty 10

## 2017-05-16 MED ORDER — SILVER NITRATE-POT NITRATE 75-25 % EX MISC
1.0000 | Freq: Once | CUTANEOUS | Status: AC
Start: 2017-05-16 — End: 2017-05-16
  Administered 2017-05-16: 1 via TOPICAL
  Filled 2017-05-16: qty 1

## 2017-05-16 NOTE — ED Provider Notes (Signed)
[facility] EMERGENCY DEPARTMENT Provider Note   CSN: [id number] Arrival date & time: 05/16/17  1050     History   Chief Complaint Chief Complaint  Patient presents with  . Epistaxis    HPI Bradley [name] is a 77 y.o. male.  HPI Bradley [name] is a 77 y.o. male on aspirin, presents to emergency department complaining of nosebleeds.  Patient states that he has had on and off bleed in his right nostril for the last 2 days.  He states he has lost a large amount of blood.  He states he would wake up in the morning in the pool of blood.  He states that he normally pinches his nose tightly and applies ice pack to stop the bleeding.  History of the same a year ago, states he had to go to the OR.  Records reviewed, patient did go to the OR, however no active bleeding was seen, he was cauterized, and his nose was packed, no other surgical treatment was provided.  Patient was seen at any pain emergency department 2 days ago for the same, no treatment was provided because his bleeding has resolved at that time.  He was given bacitracin ointment to apply in his nostril, which he is doing with a Q-tip.  He does report weakness and dizziness, states he thinks he lost too much blood.  Past Medical History:  Diagnosis Date  . Chronic lower back pain   . Contact with stonefish as cause of accidental injury 1954   "stung across my left hand in [place]; LUE weaker since"  . Coronary artery disease   . High cholesterol   . History of blood transfusion 1943  . Hypertension   . Migraine    "none since the 1990s" (09/11/2016)  . Myocardial infarction ([place]) 01/20/1993; 02/28/1993  . Pneumonia    "several times" (09/11/2016)    Patient Active Problem List   Diagnosis Date Noted  . Rt Hip fracture ([place]) 12/19/2016  . Chest pain 09/12/2016  . Leukocytosis 09/12/2016  . Abnormal chest x-ray 09/12/2016  . Hyperlipidemia 09/12/2016  . Hypokalemia 09/12/2016  . Precordial pain   . Tobacco  abuse 09/11/2016  . Emphysema, unspecified ([place]) 09/11/2016  . CAD (coronary artery disease), native coronary artery 09/11/2016  . Chronic pain syndrome 09/11/2016  . Essential hypertension 09/11/2016  . Right-sided epistaxis 03/15/2016    Past Surgical History:  Procedure Laterality Date  . APPENDECTOMY    . CARDIAC CATHETERIZATION    . CATARACT EXTRACTION, BILATERAL Bilateral   . HIP ARTHROPLASTY Right 12/20/2016   Procedure: ARTHROPLASTY BIPOLAR HIP (HEMIARTHROPLASTY);  Surgeon: [name], MD;  Location: [facility];  Service: Orthopedics;  Laterality: Right;  . JOINT REPLACEMENT    . LEFT HEART CATH AND CORONARY ANGIOGRAPHY N/A 09/12/2016   Procedure: LEFT HEART CATH AND CORONARY ANGIOGRAPHY;  Surgeon: Burnell Blanks, MD;  Location: [facility];  Service: Cardiovascular;  Laterality: N/A;  . NASAL ENDOSCOPY WITH EPISTAXIS CONTROL Right 03/15/2016   Procedure: NASAL ENDOSCOPY WITH EPISTAXIS CONTROL;  Surgeon: Jodi Marble, MD;  Location: [facility];  Service: ENT;  Laterality: Right;  . SEPTOPLASTY Right 03/15/2016   Procedure: SEPTOPLASTY;  Surgeon: Jodi Marble, MD;  Location: [facility];  Service: ENT;  Laterality: Right;  . SINUS EXPLORATION Right 03/15/2016   Procedure: SINUS EXPLORATION;  Surgeon: Jodi Marble, MD;  Location: [facility];  Service: ENT;  Laterality: Right;  . TONSILLECTOMY    . TOOTH EXTRACTION    .  TOTAL KNEE ARTHROPLASTY Left         Home Medications    Prior to Admission medications   Medication Sig Start Date End Date Taking? Authorizing Provider  ADVAIR HFA 115-21 MCG/ACT inhaler Inhale 2 puffs into the lungs every 12 (twelve) hours. 01/29/16   [provider]  amLODipine (NORVASC) 5 MG tablet Take 1 tablet (5 mg total) by mouth daily. 04/15/17   Lendon Colonel, NP  aspirin EC 81 MG tablet Take 1 tablet by mouth daily.    [provider]  celecoxib (CELEBREX) 200 MG capsule Take 1 capsule (200 mg total) by mouth daily. 12/22/16    [name], MD  cyclobenzaprine (FLEXERIL) 5 MG tablet Take 5 mg by mouth 3 (three) times daily as needed for muscle spasms.    [provider]  Ferrous Sulfate (IRON) 325 (65 Fe) MG TABS Take 1 tablet by mouth daily. 12/10/15   [provider]  gabapentin (NEURONTIN) 300 MG capsule Take 1 capsule by mouth 3 (three) times daily. 08/15/16   [provider]  KLOR-CON M20 20 MEQ tablet TAKE 1 TABLET BY MOUTH EVERY DAY Patient not taking: Reported on 05/14/2017 04/20/17   Lendon Colonel, NP  lisinopril (PRINIVIL,ZESTRIL) 10 MG tablet Take 1 tablet (10 mg total) by mouth daily. 04/15/17   Lendon Colonel, NP  metoCLOPramide (REGLAN) 5 MG tablet Take 1 tablet by mouth 4 (four) times daily -  before meals and at bedtime. 01/25/16   [provider]  metoprolol succinate (TOPROL-XL) 25 MG 24 hr tablet Take 25 mg by mouth daily.    [provider]  nitroGLYCERIN (NITROSTAT) 0.4 MG SL tablet Place 0.4 mg under the tongue every 5 (five) minutes as needed for chest pain.    [provider]  omeprazole (PRILOSEC) 40 MG capsule Take 40 mg by mouth daily.     [provider]  Oxycodone HCl 10 MG TABS Take 10 mg by mouth 3 (three) times daily.    [provider]  potassium gluconate (HM POTASSIUM) 595 (99 K) MG TABS tablet Take 595 mg by mouth daily.    [provider]  simvastatin (ZOCOR) 10 MG tablet Take 10 mg by mouth daily. 12/23/15   [provider]  VENTOLIN HFA 108 (90 Base) MCG/ACT inhaler Inhale 2 puffs into the lungs every 4 (four) hours as needed for wheezing or shortness of breath.  12/11/15   [provider]    Family History Family History  Adopted: Yes  Problem Relation Age of Onset  . Cancer Mother   . Obesity Father     Social History Social History   Tobacco Use  . Smoking status: Current Every Day Smoker    Packs/day: 1.00    Years: 68.00    Pack years: 68.00    Types:  Cigarettes  . Smokeless tobacco: Never Used  Substance Use Topics  . Alcohol use: No    Comment: 09/11/2016 "drank from the age of 7 til 89"  . Drug use: No     Allergies   Patient has no known allergies.   Review of Systems Review of Systems  Constitutional: Negative for chills and fever.  HENT: Positive for nosebleeds.   Respiratory: Negative for cough, chest tightness and shortness of breath.   Cardiovascular: Negative for chest pain, palpitations and leg swelling.  Musculoskeletal: Negative for arthralgias, myalgias, neck pain and neck stiffness.  Skin: Negative for rash.  Allergic/Immunologic: Negative for immunocompromised  state.  Neurological: Positive for dizziness, light-headedness and headaches. Negative for weakness.  All other systems reviewed and are negative.    Physical Exam Updated Vital Signs BP 124/86 (BP Location: Right Arm)   Pulse 64   Temp 97.6 F (36.4 C) (Oral)   Resp 18   Ht 6' (1.829 m)   Wt 63.5 kg (140 lb)   SpO2 99%   BMI 18.99 kg/m   Physical Exam  Constitutional: He appears well-developed and well-nourished. No distress.  HENT:  Dried up blood and small bright red blood in the right nostril.  No active bleeding.  Normal oropharynx, absent uvula.  Eyes: Conjunctivae are normal.  Neck: Neck supple.  Cardiovascular: Normal rate.  Pulmonary/Chest: No respiratory distress.  Abdominal: He exhibits no distension.  Skin: Skin is warm and dry.  Nursing note and vitals reviewed.    ED Treatments / Results  Labs (all labs ordered are listed, but only abnormal results are displayed) Labs Reviewed  CBC    EKG None  Radiology No results found.  Procedures .Epistaxis Management Date/Time: 05/16/2017 3:14 PM Performed by: Jeannett Senior, PA-C Authorized by: Jeannett Senior, PA-C   Consent:    Consent obtained:  Verbal   Consent given by:  Patient   Risks discussed:  Bleeding, infection, nasal injury and pain    Alternatives discussed:  No treatment Anesthesia (see MAR for exact dosages):    Anesthesia method:  None Procedure details:    Treatment site:  R anterior   Treatment method:  Silver nitrate   Treatment episode: initial   Post-procedure details:    Assessment:  Bleeding stopped   Patient tolerance of procedure:  Tolerated well, no immediate complications   (including critical care time)  Medications Ordered in ED Medications  silver nitrate applicators applicator 1 Stick (has no administration in time range)  tranexamic acid (CYKLOKAPRON) injection 500 mg (has no administration in time range)     Initial Impression / Assessment and Plan / ED Course  I have reviewed the triage vital signs and the nursing notes.  Pertinent labs & imaging results that were available during my care of the patient were reviewed by me and considered in my medical decision making (see chart for details).     Patient in emergency department with right nostril bleed for 2 days, on and off.  Will check CBC especially because patient states he lost a large amount of blood and feels dizzy.  Bleeding has subsided at this time, was able to see an area of the bleeding cauterized with silver nitrate.  We will try TXA as well and monitor.  3:15 PM  Patient has been monitored for about 4 hours, he has not had any recurrent bleeding.  At this time, patient is stable for discharge home.  We discussed not placing any objects inside nostril, not blowing it strenuously.  We will have him follow-up with ear nose throat.  Return precautions discussed.  CBC shows stable hemoglobin.  Vitals:   05/16/17 1123 05/16/17 1127 05/16/17 1330  BP: 124/86  (!) 165/87  Pulse: 64  64  Resp: 18  18  Temp: 97.6 F (36.4 C)    TempSrc: Oral    SpO2: 99%  99%  Weight:  63.5 kg (140 lb)   Height:  6' (1.829 m)      Final Clinical Impressions(s) / ED Diagnoses   Final diagnoses:  Anterior epistaxis    ED Discharge Orders      None  Jeannett Senior, PA-C 05/16/17 1518    Tegeler, Gwenyth Allegra, MD 05/16/17 [phone]

## 2017-05-16 NOTE — ED Triage Notes (Signed)
Pt c/o nosebleed X2 days. Pt reports he can get it stopped for about 2 hours and it will restart. Pt reports having surgery in the past r/t same.

## 2017-05-16 NOTE — Discharge Instructions (Addendum)
Your blood counts are normal/at your baseline. If bleeding starts again, use afrin nasal spray, you can get it any pharmacy, and pinch nose tightly for 10 min. Follow up with Ear Nose Throat on Monday. Return if worsening.

## 2017-05-16 NOTE — ED Provider Notes (Signed)
[facility] EMERGENCY DEPARTMENT Provider Note   CSN: [id number] Arrival date & time: 05/14/17  2021     History   Chief Complaint Chief Complaint  Patient presents with  . Epistaxis    HPI Bradley [name] is a 77 y.o. male.  Patient presents with left greater than right nosebleed for the past 30 to 60 minutes.  This happens on a rare occasion.  No other complaints.  Bleeding has settled down dramatically.  He is on aspirin and Celebrex.  No other blood thinners.  Severity of symptoms is mild.  He required an otolaryngology procedure a couple years ago for persistent bleeding.     Past Medical History:  Diagnosis Date  . Chronic lower back pain   . Contact with stonefish as cause of accidental injury 1954   "stung across my left hand in [place]; LUE weaker since"  . Coronary artery disease   . High cholesterol   . History of blood transfusion 1943  . Hypertension   . Migraine    "none since the 1990s" (09/11/2016)  . Myocardial infarction ([place]) 01/20/1993; 02/28/1993  . Pneumonia    "several times" (09/11/2016)    Patient Active Problem List   Diagnosis Date Noted  . Rt Hip fracture ([place]) 12/19/2016  . Chest pain 09/12/2016  . Leukocytosis 09/12/2016  . Abnormal chest x-ray 09/12/2016  . Hyperlipidemia 09/12/2016  . Hypokalemia 09/12/2016  . Precordial pain   . Tobacco abuse 09/11/2016  . Emphysema, unspecified ([place]) 09/11/2016  . CAD (coronary artery disease), native coronary artery 09/11/2016  . Chronic pain syndrome 09/11/2016  . Essential hypertension 09/11/2016  . Right-sided epistaxis 03/15/2016    Past Surgical History:  Procedure Laterality Date  . APPENDECTOMY    . CARDIAC CATHETERIZATION    . CATARACT EXTRACTION, BILATERAL Bilateral   . HIP ARTHROPLASTY Right 12/20/2016   Procedure: ARTHROPLASTY BIPOLAR HIP (HEMIARTHROPLASTY);  Surgeon: [name], MD;  Location: [facility];  Service: Orthopedics;  Laterality: Right;  . JOINT REPLACEMENT    . LEFT  HEART CATH AND CORONARY ANGIOGRAPHY N/A 09/12/2016   Procedure: LEFT HEART CATH AND CORONARY ANGIOGRAPHY;  Surgeon: Burnell Blanks, MD;  Location: [facility];  Service: Cardiovascular;  Laterality: N/A;  . NASAL ENDOSCOPY WITH EPISTAXIS CONTROL Right 03/15/2016   Procedure: NASAL ENDOSCOPY WITH EPISTAXIS CONTROL;  Surgeon: Jodi Marble, MD;  Location: [facility];  Service: ENT;  Laterality: Right;  . SEPTOPLASTY Right 03/15/2016   Procedure: SEPTOPLASTY;  Surgeon: Jodi Marble, MD;  Location: [facility];  Service: ENT;  Laterality: Right;  . SINUS EXPLORATION Right 03/15/2016   Procedure: SINUS EXPLORATION;  Surgeon: Jodi Marble, MD;  Location: [facility];  Service: ENT;  Laterality: Right;  . TONSILLECTOMY    . TOOTH EXTRACTION    . TOTAL KNEE ARTHROPLASTY Left         Home Medications    Prior to Admission medications   Medication Sig Start Date End Date Taking? Authorizing Provider  ADVAIR HFA 115-21 MCG/ACT inhaler Inhale 2 puffs into the lungs every 12 (twelve) hours. 01/29/16  Yes [provider]  amLODipine (NORVASC) 5 MG tablet Take 1 tablet (5 mg total) by mouth daily. 04/15/17  Yes Lendon Colonel, NP  aspirin EC 81 MG tablet Take 1 tablet by mouth daily.   Yes [provider]  celecoxib (CELEBREX) 200 MG capsule Take 1 capsule (200 mg total) by mouth daily. 12/22/16  Yes [name], MD  cyclobenzaprine (FLEXERIL) 5 MG  tablet Take 5 mg by mouth 3 (three) times daily as needed for muscle spasms.   Yes [provider]  Ferrous Sulfate (IRON) 325 (65 Fe) MG TABS Take 1 tablet by mouth daily. 12/10/15  Yes [provider]  gabapentin (NEURONTIN) 300 MG capsule Take 1 capsule by mouth 3 (three) times daily. 08/15/16  Yes [provider]  lisinopril (PRINIVIL,ZESTRIL) 10 MG tablet Take 1 tablet (10 mg total) by mouth daily. 04/15/17  Yes Lendon Colonel, NP  metoCLOPramide (REGLAN) 5 MG tablet Take 1 tablet by mouth 4 (four) times  daily -  before meals and at bedtime. 01/25/16  Yes [provider]  metoprolol succinate (TOPROL-XL) 25 MG 24 hr tablet Take 25 mg by mouth daily.   Yes [provider]  nitroGLYCERIN (NITROSTAT) 0.4 MG SL tablet Place 0.4 mg under the tongue every 5 (five) minutes as needed for chest pain.   Yes [provider]  omeprazole (PRILOSEC) 40 MG capsule Take 40 mg by mouth daily.    Yes [provider]  Oxycodone HCl 10 MG TABS Take 10 mg by mouth 3 (three) times daily.   Yes [provider]  potassium gluconate (HM POTASSIUM) 595 (99 K) MG TABS tablet Take 595 mg by mouth daily.   Yes [provider]  simvastatin (ZOCOR) 10 MG tablet Take 10 mg by mouth daily. 12/23/15  Yes [provider]  VENTOLIN HFA 108 (90 Base) MCG/ACT inhaler Inhale 2 puffs into the lungs every 4 (four) hours as needed for wheezing or shortness of breath.  12/11/15  Yes [provider]  KLOR-CON M20 20 MEQ tablet TAKE 1 TABLET BY MOUTH EVERY DAY Patient not taking: Reported on 05/14/2017 04/20/17   Lendon Colonel, NP    Family History Family History  Adopted: Yes  Problem Relation Age of Onset  . Cancer Mother   . Obesity Father     Social History Social History   Tobacco Use  . Smoking status: Current Every Day Smoker    Packs/day: 1.00    Years: 68.00    Pack years: 68.00    Types: Cigarettes  . Smokeless tobacco: Never Used  Substance Use Topics  . Alcohol use: No    Comment: 09/11/2016 "drank from the age of 64 til 29"  . Drug use: No     Allergies   Patient has no known allergies.   Review of Systems Review of Systems  All other systems reviewed and are negative.    Physical Exam Updated Vital Signs BP 139/75   Pulse 65   Temp (!) 97.4 F (36.3 C) (Oral)   Resp 18   Ht 6' (1.829 m)   Wt 63.5 kg (140 lb)   SpO2 98%   BMI 18.99 kg/m   Physical Exam  Constitutional: He is oriented to person, place, and time.  He appears well-developed and well-nourished.  HENT:  Head: Normocephalic and atraumatic.  Both nares examined: No obvious bleeding source.  Eyes: Conjunctivae are normal.  Neck: Neck supple.  Cardiovascular: Normal rate and regular rhythm.  Pulmonary/Chest: Effort normal and breath sounds normal.  Abdominal: Soft. Bowel sounds are normal.  Musculoskeletal: Normal range of motion.  Neurological: He is alert and oriented to person, place, and time.  Skin: Skin is warm and dry.  Psychiatric: He has a normal mood and affect. His behavior is normal.  Nursing note and vitals reviewed.    ED Treatments / Results  Labs (all labs  ordered are listed, but only abnormal results are displayed) Labs Reviewed - No data to display  EKG None  Radiology No results found.  Procedures Procedures (including critical care time)  Medications Ordered in ED Medications  bacitracin 500 UNIT/GM ointment (  Given 05/15/17 0017)     Initial Impression / Assessment and Plan / ED Course  I have reviewed the triage vital signs and the nursing notes.  Pertinent labs & imaging results that were available during my care of the patient were reviewed by me and considered in my medical decision making (see chart for details).     Patient was observed in the emergency department for approximately 2 hours.  He had 2 to 3 drops of blood.  He was stable at discharge.  We discussed options for treating his nosebleed.  He will return if worse.  Final Clinical Impressions(s) / ED Diagnoses   Final diagnoses:  Epistaxis    ED Discharge Orders    None       Nat Christen, MD 05/16/17 [phone]

## 2017-05-22 DIAGNOSIS — S72009A Fracture of unspecified part of neck of unspecified femur, initial encounter for closed fracture: Secondary | ICD-10-CM | POA: Diagnosis not present

## 2017-05-26 DIAGNOSIS — M545 Low back pain: Secondary | ICD-10-CM | POA: Diagnosis not present

## 2017-06-16 DIAGNOSIS — S72001D Fracture of unspecified part of neck of right femur, subsequent encounter for closed fracture with routine healing: Secondary | ICD-10-CM | POA: Diagnosis not present

## 2017-06-21 DIAGNOSIS — S72009A Fracture of unspecified part of neck of unspecified femur, initial encounter for closed fracture: Secondary | ICD-10-CM | POA: Diagnosis not present

## 2017-06-23 DIAGNOSIS — Z79899 Other long term (current) drug therapy: Secondary | ICD-10-CM | POA: Diagnosis not present

## 2017-06-23 DIAGNOSIS — Z79891 Long term (current) use of opiate analgesic: Secondary | ICD-10-CM | POA: Diagnosis not present

## 2017-06-23 DIAGNOSIS — M545 Low back pain: Secondary | ICD-10-CM | POA: Diagnosis not present

## 2017-06-30 DIAGNOSIS — M545 Low back pain: Secondary | ICD-10-CM | POA: Diagnosis not present

## 2017-07-07 DIAGNOSIS — M545 Low back pain: Secondary | ICD-10-CM | POA: Diagnosis not present

## 2017-07-07 DIAGNOSIS — Z79891 Long term (current) use of opiate analgesic: Secondary | ICD-10-CM | POA: Diagnosis not present

## 2017-07-07 DIAGNOSIS — I10 Essential (primary) hypertension: Secondary | ICD-10-CM | POA: Diagnosis not present

## 2017-07-07 DIAGNOSIS — Z79899 Other long term (current) drug therapy: Secondary | ICD-10-CM | POA: Diagnosis not present

## 2017-07-07 DIAGNOSIS — E785 Hyperlipidemia, unspecified: Secondary | ICD-10-CM | POA: Diagnosis not present

## 2017-07-22 DIAGNOSIS — S72009A Fracture of unspecified part of neck of unspecified femur, initial encounter for closed fracture: Secondary | ICD-10-CM | POA: Diagnosis not present

## 2017-07-28 DIAGNOSIS — M545 Low back pain: Secondary | ICD-10-CM | POA: Diagnosis not present

## 2017-07-28 DIAGNOSIS — D519 Vitamin B12 deficiency anemia, unspecified: Secondary | ICD-10-CM | POA: Diagnosis not present

## 2017-07-28 DIAGNOSIS — Z79899 Other long term (current) drug therapy: Secondary | ICD-10-CM | POA: Diagnosis not present

## 2017-08-01 ENCOUNTER — Emergency Department (HOSPITAL_COMMUNITY): Payer: Medicare HMO

## 2017-08-01 ENCOUNTER — Emergency Department (HOSPITAL_COMMUNITY)
Admission: EM | Admit: 2017-08-01 | Discharge: 2017-08-01 | Disposition: A | Discharge status: Home / Self Care | Payer: Medicare HMO | Attending: Emergency Medicine | Admitting: Emergency Medicine

## 2017-08-01 ENCOUNTER — Encounter (HOSPITAL_COMMUNITY): Payer: Self-pay | Admitting: Emergency Medicine

## 2017-08-01 DIAGNOSIS — S8991XA Unspecified injury of right lower leg, initial encounter: Secondary | ICD-10-CM | POA: Diagnosis present

## 2017-08-01 DIAGNOSIS — R69 Illness, unspecified: Secondary | ICD-10-CM | POA: Diagnosis not present

## 2017-08-01 DIAGNOSIS — Z79899 Other long term (current) drug therapy: Secondary | ICD-10-CM | POA: Insufficient documentation

## 2017-08-01 DIAGNOSIS — T148XXA Other injury of unspecified body region, initial encounter: Secondary | ICD-10-CM | POA: Diagnosis not present

## 2017-08-01 DIAGNOSIS — Y9301 Activity, walking, marching and hiking: Secondary | ICD-10-CM | POA: Diagnosis not present

## 2017-08-01 DIAGNOSIS — Y92008 Other place in unspecified non-institutional (private) residence as the place of occurrence of the external cause: Secondary | ICD-10-CM | POA: Diagnosis not present

## 2017-08-01 DIAGNOSIS — Z7982 Long term (current) use of aspirin: Secondary | ICD-10-CM | POA: Diagnosis not present

## 2017-08-01 DIAGNOSIS — S82141A Displaced bicondylar fracture of right tibia, initial encounter for closed fracture: Secondary | ICD-10-CM | POA: Insufficient documentation

## 2017-08-01 DIAGNOSIS — I2510 Atherosclerotic heart disease of native coronary artery without angina pectoris: Secondary | ICD-10-CM | POA: Insufficient documentation

## 2017-08-01 DIAGNOSIS — I10 Essential (primary) hypertension: Secondary | ICD-10-CM | POA: Diagnosis not present

## 2017-08-01 DIAGNOSIS — F17210 Nicotine dependence, cigarettes, uncomplicated: Secondary | ICD-10-CM | POA: Diagnosis not present

## 2017-08-01 DIAGNOSIS — R609 Edema, unspecified: Secondary | ICD-10-CM | POA: Diagnosis not present

## 2017-08-01 DIAGNOSIS — Y998 Other external cause status: Secondary | ICD-10-CM | POA: Insufficient documentation

## 2017-08-01 DIAGNOSIS — W172XXA Fall into hole, initial encounter: Secondary | ICD-10-CM | POA: Diagnosis not present

## 2017-08-01 DIAGNOSIS — M79604 Pain in right leg: Secondary | ICD-10-CM | POA: Diagnosis not present

## 2017-08-01 DIAGNOSIS — S82121A Displaced fracture of lateral condyle of right tibia, initial encounter for closed fracture: Secondary | ICD-10-CM | POA: Diagnosis not present

## 2017-08-01 LAB — BASIC METABOLIC PANEL
ANION GAP: 10 (ref 5–15)
BUN: 18 mg/dL (ref 8–23)
CALCIUM: 8.9 mg/dL (ref 8.9–10.3)
CO2: 26 mmol/L (ref 22–32)
CREATININE: 0.86 mg/dL (ref 0.61–1.24)
Chloride: 102 mmol/L (ref 98–111)
Glucose, Bld: 90 mg/dL (ref 70–99)
Potassium: 4 mmol/L (ref 3.5–5.1)
Sodium: 138 mmol/L (ref 135–145)

## 2017-08-01 LAB — CBC
HEMATOCRIT: 44.6 % (ref 39.0–52.0)
Hemoglobin: 14.8 g/dL (ref 13.0–17.0)
MCH: 29.6 pg (ref 26.0–34.0)
MCHC: 33.2 g/dL (ref 30.0–36.0)
MCV: 89.2 fL (ref 78.0–100.0)
PLATELETS: 424 10*3/uL — AB (ref 150–400)
RBC: 5 MIL/uL (ref 4.22–5.81)
RDW: 17 % — AB (ref 11.5–15.5)
WBC: 16.3 10*3/uL — AB (ref 4.0–10.5)

## 2017-08-01 MED ORDER — HYDROCODONE-ACETAMINOPHEN 5-325 MG PO TABS: 1.0000 | ORAL_TABLET | Freq: Four times a day (QID) | ORAL | 0 refills | Status: DC | PRN

## 2017-08-01 MED ORDER — MORPHINE SULFATE (PF) 4 MG/ML IV SOLN
4.0000 mg | Freq: Once | INTRAVENOUS | Status: AC
Start: 2017-08-01 — End: 2017-08-01
  Administered 2017-08-01: 4 mg via INTRAVENOUS
  Filled 2017-08-01: qty 1

## 2017-08-01 MED ORDER — MORPHINE SULFATE (PF) 4 MG/ML IV SOLN
4.0000 mg | Freq: Once | INTRAVENOUS | Status: AC
  Administered 2017-08-01: 4 mg via INTRAVENOUS
  Filled 2017-08-01: qty 1

## 2017-08-01 MED ORDER — OXYCODONE-ACETAMINOPHEN 5-325 MG PO TABS
1.0000 | ORAL_TABLET | Freq: Once | ORAL | Status: AC
  Administered 2017-08-01: 1 via ORAL
  Filled 2017-08-01 (×2): qty 1

## 2017-08-01 NOTE — ED Triage Notes (Signed)
Pt reports they were remodeling his porch and he went outside and fell through the hole in the wood.  C/o right knee pain.

## 2017-08-01 NOTE — Discharge Instructions (Signed)
Do not bear any weight on your right leg, keep your leg elevated, apply ice to help with the swelling, follow-up with Dr. Ninfa Linden next week, return as needed for worsening symptoms

## 2017-08-01 NOTE — ED Provider Notes (Signed)
[facility] EMERGENCY DEPARTMENT Provider Note   CSN: [id number] Arrival date & time: 08/01/17  1019     History   Chief Complaint Chief Complaint  Patient presents with  . Knee Pain    HPI [name] is a 77 y.o. male.  HPI The patient presents to the emergency room for evaluation of right knee pain.  Patient states he was having some work done at his house.  They were remodeling his porch.  Patient forgot as he walked outside and fell through a hole in the porch.  Patient is complaining of severe pain in his right knee.  He denies any other injuries.  No chest pain or headache.  No loss of consciousness.  No upper extremity pain.  No numbness or weakness. Past Medical History:  Diagnosis Date  . Chronic lower back pain   . Contact with stonefish as cause of accidental injury 1954   "stung across my left hand in [place]; LUE weaker since"  . Coronary artery disease   . High cholesterol   . History of blood transfusion 1943  . Hypertension   . Migraine    "none since the 1990s" (09/11/2016)  . Myocardial infarction ([place]) 01/20/1993; 02/28/1993  . Pneumonia    "several times" (09/11/2016)    Patient Active Problem List   Diagnosis Date Noted  . Rt Hip fracture ([place]) 12/19/2016  . Chest pain 09/12/2016  . Leukocytosis 09/12/2016  . Abnormal chest x-ray 09/12/2016  . Hyperlipidemia 09/12/2016  . Hypokalemia 09/12/2016  . Precordial pain   . Tobacco abuse 09/11/2016  . Emphysema, unspecified ([place]) 09/11/2016  . CAD (coronary artery disease), native coronary artery 09/11/2016  . Chronic pain syndrome 09/11/2016  . Essential hypertension 09/11/2016  . Right-sided epistaxis 03/15/2016    Past Surgical History:  Procedure Laterality Date  . APPENDECTOMY    . CARDIAC CATHETERIZATION    . CATARACT EXTRACTION, BILATERAL Bilateral   . HIP ARTHROPLASTY Right 12/20/2016   Procedure: ARTHROPLASTY BIPOLAR HIP (HEMIARTHROPLASTY);  Surgeon: [name], MD;  Location: [place];   Service: Orthopedics;  Laterality: Right;  . JOINT REPLACEMENT    . LEFT HEART CATH AND CORONARY ANGIOGRAPHY N/A 09/12/2016   Procedure: LEFT HEART CATH AND CORONARY ANGIOGRAPHY;  Surgeon: Burnell Blanks, MD;  Location: [place] [facility];  Service: Cardiovascular;  Laterality: N/A;  . NASAL ENDOSCOPY WITH EPISTAXIS CONTROL Right 03/15/2016   Procedure: NASAL ENDOSCOPY WITH EPISTAXIS CONTROL;  Surgeon: Jodi Marble, MD;  Location: [place];  Service: ENT;  Laterality: Right;  . SEPTOPLASTY Right 03/15/2016   Procedure: SEPTOPLASTY;  Surgeon: Jodi Marble, MD;  Location: [place];  Service: ENT;  Laterality: Right;  . SINUS EXPLORATION Right 03/15/2016   Procedure: SINUS EXPLORATION;  Surgeon: Jodi Marble, MD;  Location: [place];  Service: ENT;  Laterality: Right;  . TONSILLECTOMY    . TOOTH EXTRACTION    . TOTAL KNEE ARTHROPLASTY Left         Home Medications    Prior to Admission medications   Medication Sig Start Date End Date Taking? Authorizing Provider  ADVAIR HFA 115-21 MCG/ACT inhaler Inhale 2 puffs into the lungs every 12 (twelve) hours. 01/29/16   [provider]  amLODipine (NORVASC) 5 MG tablet Take 1 tablet (5 mg total) by mouth daily. 04/15/17   Lendon Colonel, NP  aspirin EC 81 MG tablet Take 1 tablet by mouth daily.    [provider]  celecoxib (CELEBREX) 200 MG capsule Take 1  capsule (200 mg total) by mouth daily. 12/22/16   [name], MD  cyclobenzaprine (FLEXERIL) 5 MG tablet Take 5 mg by mouth 3 (three) times daily as needed for muscle spasms.    [provider]  Ferrous Sulfate (IRON) 325 (65 Fe) MG TABS Take 1 tablet by mouth daily. 12/10/15   [provider]  gabapentin (NEURONTIN) 300 MG capsule Take 1 capsule by mouth 3 (three) times daily. 08/15/16   [provider]  HYDROcodone-acetaminophen (NORCO/VICODIN) 5-325 MG tablet Take 1 tablet by mouth every 6 (six) hours as needed. 08/01/17   Dorie Rank, MD  KLOR-CON  M20 20 MEQ tablet TAKE 1 TABLET BY MOUTH EVERY DAY Patient not taking: Reported on 05/14/2017 04/20/17   Lendon Colonel, NP  lisinopril (PRINIVIL,ZESTRIL) 10 MG tablet Take 1 tablet (10 mg total) by mouth daily. 04/15/17   Lendon Colonel, NP  metoCLOPramide (REGLAN) 5 MG tablet Take 1 tablet by mouth 4 (four) times daily -  before meals and at bedtime. 01/25/16   [provider]  metoprolol succinate (TOPROL-XL) 25 MG 24 hr tablet Take 25 mg by mouth daily.    [provider]  nitroGLYCERIN (NITROSTAT) 0.4 MG SL tablet Place 0.4 mg under the tongue every 5 (five) minutes as needed for chest pain.    [provider]  omeprazole (PRILOSEC) 40 MG capsule Take 40 mg by mouth daily.     [provider]  Oxycodone HCl 10 MG TABS Take 10 mg by mouth 3 (three) times daily.    [provider]  potassium gluconate (HM POTASSIUM) 595 (99 K) MG TABS tablet Take 595 mg by mouth daily.    [provider]  simvastatin (ZOCOR) 10 MG tablet Take 10 mg by mouth daily. 12/23/15   [provider]  VENTOLIN HFA 108 (90 Base) MCG/ACT inhaler Inhale 2 puffs into the lungs every 4 (four) hours as needed for wheezing or shortness of breath.  12/11/15   [provider]    Family History Family History  Adopted: Yes  Problem Relation Age of Onset  . Cancer Mother   . Obesity Father     Social History Social History   Tobacco Use  . Smoking status: Current Every Day Smoker    Packs/day: 1.00    Years: 68.00    Pack years: 68.00    Types: Cigarettes  . Smokeless tobacco: Never Used  Substance Use Topics  . Alcohol use: No    Comment: 09/11/2016 "drank from the age of 44 til 5"  . Drug use: No     Allergies   Patient has no known allergies.   Review of Systems Review of Systems  All other systems reviewed and are negative.    Physical Exam Updated Vital Signs BP (!) 157/71   Pulse 63   Temp 97.8 F (36.6 C) (Oral)    Ht 1.829 m (6')   Wt 67.1 kg (148 lb)   SpO2 98%   BMI 20.07 kg/m   Physical Exam  Constitutional: No distress.  HENT:  Head: Normocephalic and atraumatic.  Right Ear: External ear normal.  Left Ear: External ear normal.  Eyes: Conjunctivae are normal. Right eye exhibits no discharge. Left eye exhibits no discharge. No scleral icterus.  Neck: Neck supple. No tracheal deviation present.  Cardiovascular: Normal rate.  Pulmonary/Chest: Effort normal. No stridor. No respiratory distress.  Abdominal: He exhibits no distension.  Musculoskeletal: He exhibits edema and tenderness.  Right shoulder: He exhibits no tenderness, no bony tenderness and no swelling.       Left shoulder: He exhibits no tenderness, no bony tenderness and no swelling.       Right wrist: He exhibits no tenderness, no bony tenderness and no swelling.       Left wrist: He exhibits no tenderness, no bony tenderness and no swelling.       Right hip: He exhibits normal range of motion, no tenderness, no bony tenderness and no swelling.       Left hip: He exhibits normal range of motion, no tenderness and no bony tenderness.       Right knee: He exhibits swelling. Tenderness found.       Right ankle: He exhibits no swelling. No tenderness.       Left ankle: He exhibits no swelling. No tenderness.       Cervical back: He exhibits no tenderness, no bony tenderness and no swelling.       Thoracic back: He exhibits no tenderness, no bony tenderness and no swelling.       Lumbar back: He exhibits no tenderness, no bony tenderness and no swelling.  Right lower leg Compartments are soft,  Neurological: He is alert. Cranial nerve deficit: no gross deficits.  Skin: Skin is warm and dry. No rash noted. He is not diaphoretic.  Psychiatric: He has a normal mood and affect.  Nursing note and vitals reviewed.    ED Treatments / Results  Labs (all labs ordered are listed, but only abnormal results are displayed) Labs  Reviewed  CBC - Abnormal; Notable for the following components:      Result Value   WBC 16.3 (*)    RDW 17.0 (*)    Platelets 424 (*)    All other components within normal limits  BASIC METABOLIC PANEL    EKG None  Radiology Ct Knee Right Wo Contrast  Result Date: 08/01/2017 CLINICAL DATA:  Right knee pain since the patient suffered a fall through a hole in a porch today. Tibial plateau fracture by plain films this same day. Initial encounter. EXAM: CT OF THE RIGHT KNEE WITHOUT CONTRAST TECHNIQUE: Multidetector CT imaging of the right knee was performed according to the standard protocol. Multiplanar CT image reconstructions were also generated. COMPARISON:  Plain films right knee this same day. FINDINGS: Bones/Joint/Cartilage The patient has a fracture of the lateral tibial plateau. The posterior 2.5 cm of the lateral plateau are inferiorly angulated up to 25 degrees with depression of approximately 0.6 cm at the posterior joint line of the lateral compartment. Lateral component of the fracture exits the posterior tibial metaphysis approximately 4 cm below the joint line. Nondisplaced component of the fracture extends subjacent to the tibial eminences. There is also a nondisplaced fracture line through the posterior aspect of the medial tibial plateau. No other fracture is identified. Ligaments Suboptimally assessed by CT. The cruciate and collateral ligaments appear intact. Muscles and Tendons Intact. Soft tissues Lipohemarthrosis noted. IMPRESSION: Proximal tibial fracture as described above. The posterior 2.5 cm of the lateral plateau are inferiorly angulated approximately 25 degrees with depression of 0.6 cm at the posterior joint line. Nondisplaced components of the fracture extend subjacent to the tibial eminences and into the medial plateau. No other fracture is identified. Lipohemarthrosis is noted. Electronically Signed   By: Inge Rise M.D.   On: 08/01/2017 13:04   Dg Knee Complete  4 Views Right  Result Date: 08/01/2017 CLINICAL DATA:  Fall today, pain and swelling above patella, cannot bear weight. EXAM: RIGHT KNEE - COMPLETE 4+ VIEW COMPARISON:  None. FINDINGS: Displaced fracture within the central aspects of the tibial plateau. Probable nondisplaced fracture extension to the lateral aspects of the tibial metaphysis. Associated joint effusion. Proximal fibula appears intact and normally aligned. Distal femur appears intact and normally aligned. IMPRESSION: 1. Displaced fracture within the central aspects of the RIGHT tibial plateau. 2. Additional nondisplaced fracture extension to the lateral aspects of the proximal tibial metaphysis. 3. Associated joint effusion. Electronically Signed   By: Franki Cabot M.D.   On: 08/01/2017 11:14    Procedures Procedures (including critical care time)  Medications Ordered in ED Medications  oxyCODONE-acetaminophen (PERCOCET/ROXICET) 5-325 MG per tablet 1 tablet (1 tablet Oral Given 08/01/17 1113)  morphine 4 MG/ML injection 4 mg (4 mg Intravenous Given 08/01/17 1150)     Initial Impression / Assessment and Plan / ED Course  I have reviewed the triage vital signs and the nursing notes.  Pertinent labs & imaging results that were available during my care of the patient were reviewed by me and considered in my medical decision making (see chart for details).  Clinical Course as of Aug 02 1322  Sat Aug 01, 2017  1202 Discussed case with Dr. Ninfa Linden.  He reviewed the films.  Patient will not require any emergent intervention.  We will get a CT scan.  Ice, elevate, knee immobilizer and plan on outpatient follow-up   [JK]    Clinical Course User Index [JK] Dorie Rank, MD    Patient presented to the emergency room for evaluation of right knee pain after a fall.  X-rays and CT scan confirmed a tibial plateau fracture.  Patient is neurovascularly intact.  I discussed the case with Dr. Ninfa Linden.  He recommends outpatient follow-up in his  office.  Patient was given a knee immobilizer.  He also requested crutches and he does have a walker at home.  Patient instructed to ice and elevate his leg.  Return to the ED as needed for worsening symptoms  Final Clinical Impressions(s) / ED Diagnoses   Final diagnoses:  Closed fracture of right tibial plateau, initial encounter    ED Discharge Orders        Ordered    HYDROcodone-acetaminophen (NORCO/VICODIN) 5-325 MG tablet  Every 6 hours PRN     08/01/17 1322       Dorie Rank, MD 08/01/17 1325

## 2017-08-08 ENCOUNTER — Other Ambulatory Visit: Payer: Self-pay

## 2017-08-08 ENCOUNTER — Encounter (HOSPITAL_COMMUNITY): Payer: Self-pay | Admitting: *Deleted

## 2017-08-08 ENCOUNTER — Emergency Department (HOSPITAL_COMMUNITY): Payer: Medicare HMO

## 2017-08-08 ENCOUNTER — Emergency Department (HOSPITAL_COMMUNITY)
Admission: EM | Admit: 2017-08-08 | Discharge: 2017-08-09 | Disposition: A | Discharge status: Home / Self Care | Payer: Medicare HMO | Attending: Emergency Medicine | Admitting: Emergency Medicine

## 2017-08-08 DIAGNOSIS — Y999 Unspecified external cause status: Secondary | ICD-10-CM | POA: Insufficient documentation

## 2017-08-08 DIAGNOSIS — S20309A Unspecified superficial injuries of unspecified front wall of thorax, initial encounter: Secondary | ICD-10-CM | POA: Diagnosis present

## 2017-08-08 DIAGNOSIS — W010XXA Fall on same level from slipping, tripping and stumbling without subsequent striking against object, initial encounter: Secondary | ICD-10-CM | POA: Insufficient documentation

## 2017-08-08 DIAGNOSIS — I2510 Atherosclerotic heart disease of native coronary artery without angina pectoris: Secondary | ICD-10-CM | POA: Diagnosis not present

## 2017-08-08 DIAGNOSIS — Y939 Activity, unspecified: Secondary | ICD-10-CM | POA: Insufficient documentation

## 2017-08-08 DIAGNOSIS — F17210 Nicotine dependence, cigarettes, uncomplicated: Secondary | ICD-10-CM | POA: Diagnosis not present

## 2017-08-08 DIAGNOSIS — Z79899 Other long term (current) drug therapy: Secondary | ICD-10-CM | POA: Insufficient documentation

## 2017-08-08 DIAGNOSIS — R69 Illness, unspecified: Secondary | ICD-10-CM | POA: Diagnosis not present

## 2017-08-08 DIAGNOSIS — S20211A Contusion of right front wall of thorax, initial encounter: Secondary | ICD-10-CM | POA: Diagnosis not present

## 2017-08-08 DIAGNOSIS — S20221A Contusion of right back wall of thorax, initial encounter: Secondary | ICD-10-CM | POA: Diagnosis not present

## 2017-08-08 DIAGNOSIS — R1084 Generalized abdominal pain: Secondary | ICD-10-CM | POA: Insufficient documentation

## 2017-08-08 DIAGNOSIS — I119 Hypertensive heart disease without heart failure: Secondary | ICD-10-CM | POA: Insufficient documentation

## 2017-08-08 DIAGNOSIS — Z7982 Long term (current) use of aspirin: Secondary | ICD-10-CM | POA: Diagnosis not present

## 2017-08-08 DIAGNOSIS — Y929 Unspecified place or not applicable: Secondary | ICD-10-CM | POA: Diagnosis not present

## 2017-08-08 DIAGNOSIS — J811 Chronic pulmonary edema: Secondary | ICD-10-CM | POA: Diagnosis not present

## 2017-08-08 LAB — URINALYSIS, ROUTINE W REFLEX MICROSCOPIC
Bilirubin Urine: NEGATIVE
GLUCOSE, UA: NEGATIVE mg/dL
KETONES UR: NEGATIVE mg/dL
Nitrite: NEGATIVE
PROTEIN: NEGATIVE mg/dL
Specific Gravity, Urine: 1.016 (ref 1.005–1.030)
pH: 5 (ref 5.0–8.0)

## 2017-08-08 NOTE — ED Provider Notes (Signed)
[name]ERGENC[facility] Provider Note   CSN: [id number] Arrival date & time: 08/08/17  2146     History   Chief Complaint Chief Complaint  Patient presents with  . Flank Pain    HPI [name] is a 77 y.o. male.  HPI   He presents for evaluation of right flank pain.  Pain started a day after his fall 1 week ago when he fractured his right proximal tibia.  He is using chronic pain medicine with partial relief.  He denies shortness of breath, abdominal pain, nausea, vomiting, weakness or dizziness.  Pain in the right chest is worse with eating.  There are no other known modifying factors.  Past Medical History:  Diagnosis Date  . Chronic lower back pain   . Contact with stonefish as cause of accidental injury 1954   "stung across my left hand in [place]; LUE weaker since"  . Coronary artery disease   . High cholesterol   . History of blood transfusion 1943  . Hypertension   . Migraine    "none since the 1990s" (09/11/2016)  . Myocardial infarction ([place]) 01/20/1993; 02/28/1993  . Pneumonia    "several times" (09/11/2016)    Patient Active Problem List   Diagnosis Date Noted  . Rt Hip fracture ([place]) 12/19/2016  . Chest pain 09/12/2016  . Leukocytosis 09/12/2016  . Abnormal chest x-ray 09/12/2016  . Hyperlipidemia 09/12/2016  . Hypokalemia 09/12/2016  . Precordial pain   . Tobacco abuse 09/11/2016  . Emphysema, unspecified ([place]) 09/11/2016  . CAD (coronary artery disease), native coronary artery 09/11/2016  . Chronic pain syndrome 09/11/2016  . Essential hypertension 09/11/2016  . Right-sided epistaxis 03/15/2016    Past Surgical History:  Procedure Laterality Date  . APPENDECTOMY    . CARDIAC CATHETERIZATION    . CATARACT EXTRACTION, BILATERAL Bilateral   . HIP ARTHROPLASTY Right 12/20/2016   Procedure: ARTHROPLASTY BIPOLAR HIP (HEMIARTHROPLASTY);  Surgeon: [name], MD;  Location: [place];  Service: Orthopedics;  Laterality: Right;  . JOINT  REPLACEMENT    . LEFT HEART CATH AND CORONARY ANGIOGRAPHY N/A 09/12/2016   Procedure: LEFT HEART CATH AND CORONARY ANGIOGRAPHY;  Surgeon: Burnell Blanks, MD;  Location: [place] [facility];  Service: Cardiovascular;  Laterality: N/A;  . NASAL ENDOSCOPY WITH EPISTAXIS CONTROL Right 03/15/2016   Procedure: NASAL ENDOSCOPY WITH EPISTAXIS CONTROL;  Surgeon: Jodi Marble, MD;  Location: [place];  Service: ENT;  Laterality: Right;  . SEPTOPLASTY Right 03/15/2016   Procedure: SEPTOPLASTY;  Surgeon: Jodi Marble, MD;  Location: [place];  Service: ENT;  Laterality: Right;  . SINUS EXPLORATION Right 03/15/2016   Procedure: SINUS EXPLORATION;  Surgeon: Jodi Marble, MD;  Location: [place];  Service: ENT;  Laterality: Right;  . TONSILLECTOMY    . TOOTH EXTRACTION    . TOTAL KNEE ARTHROPLASTY Left         Home Medications    Prior to Admission medications   Medication Sig Start Date End Date Taking? Authorizing Provider  ADVAIR HFA 115-21 MCG/ACT inhaler Inhale 2 puffs into the lungs every 12 (twelve) hours. 01/29/16  Yes [provider]  amLODipine (NORVASC) 5 MG tablet Take 1 tablet (5 mg total) by mouth daily. 04/15/17  Yes Lendon Colonel, NP  aspirin EC 81 MG tablet Take 1 tablet by mouth daily.   Yes [provider]  celecoxib (CELEBREX) 200 MG capsule Take 1 capsule (200 mg total) by mouth daily. 12/22/16  Yes [name], MD  cyclobenzaprine (FLEXERIL) 5 MG tablet Take 5 mg by mouth 3 (three) times daily as needed for muscle spasms.   Yes [provider]  Ferrous Sulfate (IRON) 325 (65 Fe) MG TABS Take 1 tablet by mouth daily. 12/10/15  Yes [provider]  gabapentin (NEURONTIN) 300 MG capsule Take 1 capsule by mouth 3 (three) times daily. 08/15/16  Yes [provider]  KLOR-CON M20 20 MEQ tablet TAKE 1 TABLET BY MOUTH EVERY DAY 04/20/17  Yes Lendon Colonel, NP  lisinopril (PRINIVIL,ZESTRIL) 10 MG tablet Take 1 tablet (10 mg total) by mouth  daily. 04/15/17  Yes Lendon Colonel, NP  metoCLOPramide (REGLAN) 5 MG tablet Take 1 tablet by mouth 4 (four) times daily -  before meals and at bedtime. 01/25/16  Yes [provider]  metoprolol succinate (TOPROL-XL) 25 MG 24 hr tablet Take 25 mg by mouth daily.   Yes [provider]  omeprazole (PRILOSEC) 40 MG capsule Take 40 mg by mouth daily.    Yes [provider]  Oxycodone HCl 10 MG TABS Take 10 mg by mouth 3 (three) times daily.   Yes [provider]  potassium gluconate (HM POTASSIUM) 595 (99 K) MG TABS tablet Take 595 mg by mouth daily.   Yes [provider]  simvastatin (ZOCOR) 10 MG tablet Take 10 mg by mouth daily. 12/23/15  Yes [provider]  nitroGLYCERIN (NITROSTAT) 0.4 MG SL tablet Place 0.4 mg under the tongue every 5 (five) minutes as needed for chest pain.    [provider]  VENTOLIN HFA 108 (90 Base) MCG/ACT inhaler Inhale 2 puffs into the lungs every 4 (four) hours as needed for wheezing or shortness of breath.  12/11/15   [provider]    Family History Family History  Adopted: Yes  Problem Relation Age of Onset  . Cancer Mother   . Obesity Father     Social History Social History   Tobacco Use  . Smoking status: Current Every Day Smoker    Packs/day: 1.00    Years: 68.00    Pack years: 68.00    Types: Cigarettes  . Smokeless tobacco: Never Used  Substance Use Topics  . Alcohol use: No    Comment: 09/11/2016 "drank from the age of 61 til 21"  . Drug use: No     Allergies   Patient has no known allergies.   Review of Systems Review of Systems  All other systems reviewed and are negative.    Physical Exam Updated Vital Signs BP (!) 122/93 (BP Location: Right Arm)   Pulse 82   Temp 97.9 F (36.6 C) (Oral)   Resp 18   Ht 6' (1.829 m)   Wt 68 kg (150 lb)   SpO2 94%   BMI 20.34 kg/m   Physical Exam  Constitutional: He is oriented to person, place, and time. He  appears well-developed and well-nourished.  HENT:  Head: Normocephalic and atraumatic.  Right Ear: External ear normal.  Left Ear: External ear normal.  Eyes: Pupils are equal, round, and reactive to light. Conjunctivae and EOM are normal.  Neck: Normal range of motion and phonation normal. Neck supple.  Cardiovascular: Normal rate.  Pulmonary/Chest: Effort normal. He exhibits tenderness (Right posterior lateral chest wall tenderness without crepitation or deformity.). He exhibits no bony tenderness.  Abdominal: Soft. There is no tenderness.  Musculoskeletal: Normal range of motion.  Neurological: He is alert and oriented to person, place, and time. No cranial nerve  deficit or sensory deficit. He exhibits normal muscle tone. Coordination normal.  Skin: Skin is warm, dry and intact.  Psychiatric: He has a normal mood and affect. His behavior is normal. Judgment and thought content normal.  Nursing note and vitals reviewed.    ED Treatments / Results  Labs (all labs ordered are listed, but only abnormal results are displayed) Labs Reviewed  URINALYSIS, ROUTINE W REFLEX MICROSCOPIC - Abnormal; Notable for the following components:      Result Value   APPearance HAZY (*)    Hgb urine dipstick SMALL (*)    Leukocytes, UA SMALL (*)    Bacteria, UA RARE (*)    All other components within normal limits  URINE CULTURE    EKG None  Radiology Dg Ribs Unilateral W/chest Right  Result Date: 08/08/2017 CLINICAL DATA:  Right flank pain EXAM: RIGHT RIBS AND CHEST - 3+ VIEW COMPARISON:  12/22/2016 FINDINGS: Diffuse interstitial opacity, increased from the prior study, most suggestive of mild interstitial pulmonary edema. Cardiomediastinal contours are normal. There is atherosclerotic calcification of the aortic arch. No rib fracture. IMPRESSION: Mild interstitial pulmonary edema.  No rib fracture. Electronically Signed   By: Ulyses Jarred M.D.   On: 08/08/2017 23:01    Procedures Procedures  (including critical care time)  Medications Ordered in ED Medications - No data to display   Initial Impression / Assessment and Plan / ED Course  I have reviewed the triage vital signs and the nursing notes.  Pertinent labs & imaging results that were available during my care of the patient were reviewed by me and considered in my medical decision making (see chart for details).  Clinical Course as of Aug 09 2343  Sat Aug 08, 2017  2236 DG Ribs Unilateral W/Chest Right [EW]  2342 Normal except mild elevation WBC and rare bacteria.  This is not diagnostic for UTI.  Patient does not have symptoms of UTI.  Urinalysis, Routine w reflex microscopic- may I&O cath if menses(!) [EW]  2343 No fracture, or pulmonary contusion, images reviewed by me  DG Ribs Unilateral W/Chest Right [EW]    Clinical Course User Index [EW] Daleen Bo, MD     Patient Vitals for the past 24 hrs:  BP Temp Temp src Pulse Resp SpO2 Height Weight  08/08/17 2151 (!) 122/93 97.9 F (36.6 C) Oral 82 18 94 % 6' (1.829 m) 68 kg (150 lb)    11:43 PM Reevaluation with update and discussion. After initial assessment and treatment, an updated evaluation reveals patient is comfortable now has no further complaints.  Findings discussed with patient and grandson, all questions answered. Daleen Bo   Medical Decision Making: Evaluation consistent with contusion right chest wall without fracture.  Doubt visceral injury.  Doubt pulmonary injury.  Patient is currently receiving treatment from orthopedics and plans on seeing them in follow-up in 2 days time.  No indication for further evaluation and treatment here in the ED setting.  CRITICAL CARE-no Performed by: Daleen Bo   Nursing Notes Reviewed/ Care Coordinated Applicable Imaging Reviewed Interpretation of Laboratory Data incorporated into ED treatment  The patient appears reasonably screened and/or stabilized for discharge and I doubt any other medical  condition or other [facility] requiring further screening, evaluation, or treatment in the ED at this time prior to discharge.  Plan: Home Medications-continue usual medications; Home Treatments-rest, fluids; return here if the recommended treatment, does not improve the symptoms; Recommended follow up-PCP, as needed.  Orthopedic follow-up on 08/11/2017 as scheduled  Final Clinical Impressions(s) / ED Diagnoses   Final diagnoses:  Contusion of right chest wall, initial encounter    ED Discharge Orders    None       Daleen Bo, MD 08/08/17 2345

## 2017-08-08 NOTE — ED Triage Notes (Signed)
Right flank pain.

## 2017-08-08 NOTE — Discharge Instructions (Addendum)
There is no sign of serious injury, or fracture, of your chest.  Follow-up with your orthopedic doctor on Monday, as scheduled.

## 2017-08-08 NOTE — ED Notes (Signed)
Pt has not removed knee immobilizer X1 week, is still wearing paper scrubs that he left with 1 week ago. Denies bathing in 1 week as well. Paper scrubs cut off pt and placed into gown. Pt educated on removing brace for changing clothing and bathing.

## 2017-08-10 ENCOUNTER — Ambulatory Visit (INDEPENDENT_AMBULATORY_CARE_PROVIDER_SITE_OTHER): Payer: Medicare HMO | Admitting: Orthopaedic Surgery

## 2017-08-10 ENCOUNTER — Other Ambulatory Visit (INDEPENDENT_AMBULATORY_CARE_PROVIDER_SITE_OTHER): Payer: Self-pay

## 2017-08-10 ENCOUNTER — Encounter (INDEPENDENT_AMBULATORY_CARE_PROVIDER_SITE_OTHER): Payer: Self-pay | Admitting: Orthopaedic Surgery

## 2017-08-10 VITALS — Ht 72.0 in | Wt 150.0 lb

## 2017-08-10 DIAGNOSIS — S82141A Displaced bicondylar fracture of right tibia, initial encounter for closed fracture: Secondary | ICD-10-CM

## 2017-08-10 DIAGNOSIS — S82121A Displaced fracture of lateral condyle of right tibia, initial encounter for closed fracture: Secondary | ICD-10-CM

## 2017-08-10 NOTE — Progress Notes (Signed)
The patient is a 77 year old gentleman referred from the [facility] emergency room from the weekend before last when he sustained a mechanical fall injuring his right knee.  He was found to have sustained a nondisplaced tibial plateau fracture.  This was seen on plain films and on the CT scan.  He has been nonweightbearing and in the immobilizer.  He does have a history of a left knee replacement and a right hip hemiarthroplasty from a broken right hip from last year.  He says he is doing well overall and has been compliant with nonweightbearing.  He does report moderate right knee pain.  On examination the right knee has pain across the lateral joint line but clinically it is straight.  There is surprisingly not a lot of swelling at all.  His calf is soft.  I went over his plain films and CT scan with him.  This is definitely a fracture that can be treated nonoperatively with time and gradual weightbearing.  With that being said he would like to follow-up in [place] because he cannot get a ride to [place] anymore he states.  We can send him as a referral to either Dr. Aline Brochure or Dr. Luna Glasgow up there to follow him nonoperatively.  I am fine with them charging the fracture code as well for fracture follow-up.  All questions and concerns were answered and addressed.

## 2017-08-11 LAB — URINE CULTURE: Culture: >=100000 — AB

## 2017-08-12 ENCOUNTER — Telehealth: Payer: Self-pay | Admitting: *Deleted

## 2017-08-12 NOTE — Progress Notes (Signed)
ED Antimicrobial Stewardship Positive Culture Follow Up   Amante Fomby is an 77 y.o. male who presented to [facility] on 08/08/2017 with a chief complaint of  Chief Complaint  Patient presents with  . Flank Pain    Recent Results (from the past 720 hour(s))  Urine C&S     Status: Abnormal   Collection Time: 08/08/17 10:05 PM  Result Value Ref Range Status   Specimen Description   Final    URINE, CLEAN CATCH Performed at [facility], [address] [place], [place] [address]    Special Requests   Final    NONE Performed at [facility], [address] [place],  [address]    Culture >=100,000 COLONIES/mL ESCHERICHIA COLI (A)  Final   Report Status 08/11/2017 FINAL  Final   Organism ID, Bacteria ESCHERICHIA COLI (A)  Final      Susceptibility   Escherichia coli - MIC*    AMPICILLIN >=32 RESISTANT Resistant     CEFAZOLIN <=4 SENSITIVE Sensitive     CEFTRIAXONE <=1 SENSITIVE Sensitive     CIPROFLOXACIN <=0.25 SENSITIVE Sensitive     GENTAMICIN <=1 SENSITIVE Sensitive     IMIPENEM <=0.25 SENSITIVE Sensitive     NITROFURANTOIN <=16 SENSITIVE Sensitive     TRIMETH/SULFA >=320 RESISTANT Resistant     AMPICILLIN/SULBACTAM 16 INTERMEDIATE Intermediate     PIP/TAZO <=4 SENSITIVE Sensitive     Extended ESBL NEGATIVE Sensitive     * >=100,000 COLONIES/mL ESCHERICHIA COLI    []  Treated with N/A, organism resistant to prescribed antimicrobial [x]  Patient discharged originally without antimicrobial agent and treatment is now indicated  New antibiotic prescription: If symptomatic, cephalexin 500mg  PO BID x 7 days  ED Provider: Maury Dus, PA   Kathleene Bergemann, Rande Lawman 08/12/2017, 9:13 AM Clinical Pharmacist Monday - Friday phone -  [phone] Saturday - Sunday phone - [phone]

## 2017-08-12 NOTE — Telephone Encounter (Signed)
Post ED Visit - Positive Culture Follow-up  Culture report reviewed by antimicrobial stewardship pharmacist:  []  Elenor Quinones, Pharm.D. []  Heide Guile, Pharm.D., BCPS AQ-ID []  Parks Neptune, Pharm.D., BCPS []  Alycia Rossetti, Pharm.D., BCPS []  [place], Pharm.D., BCPS, AAHIVP []  Legrand Como, Pharm.D., BCPS, AAHIVP [x]  Salome Arnt, PharmD, BCPS []  Johnnette Gourd, PharmD, BCPS []  Hughes Better, PharmD, BCPS []  Leeroy Cha, PharmD  Positive urine culture Contacted patient for symptom check.  States he is feeling much better and no further patient follow-up is required at this time.  Harlon Flor [facility] 08/12/2017, 10:13 AM

## 2017-08-22 ENCOUNTER — Other Ambulatory Visit: Payer: Self-pay | Admitting: Adult Health

## 2017-08-25 DIAGNOSIS — N401 Enlarged prostate with lower urinary tract symptoms: Secondary | ICD-10-CM | POA: Diagnosis not present

## 2017-08-25 DIAGNOSIS — M25551 Pain in right hip: Secondary | ICD-10-CM | POA: Diagnosis not present

## 2017-08-25 DIAGNOSIS — M545 Low back pain: Secondary | ICD-10-CM | POA: Diagnosis not present

## 2017-08-25 DIAGNOSIS — N3281 Overactive bladder: Secondary | ICD-10-CM | POA: Diagnosis not present

## 2017-08-25 DIAGNOSIS — Z79891 Long term (current) use of opiate analgesic: Secondary | ICD-10-CM | POA: Diagnosis not present

## 2017-08-25 DIAGNOSIS — S82121D Displaced fracture of lateral condyle of right tibia, subsequent encounter for closed fracture with routine healing: Secondary | ICD-10-CM | POA: Diagnosis not present

## 2017-09-14 ENCOUNTER — Encounter (INDEPENDENT_AMBULATORY_CARE_PROVIDER_SITE_OTHER): Payer: Self-pay | Admitting: Orthopaedic Surgery

## 2017-09-14 ENCOUNTER — Ambulatory Visit (INDEPENDENT_AMBULATORY_CARE_PROVIDER_SITE_OTHER): Payer: Medicare Other | Admitting: Orthopaedic Surgery

## 2017-09-14 ENCOUNTER — Ambulatory Visit (INDEPENDENT_AMBULATORY_CARE_PROVIDER_SITE_OTHER): Payer: Self-pay

## 2017-09-14 DIAGNOSIS — S82121D Displaced fracture of lateral condyle of right tibia, subsequent encounter for closed fracture with routine healing: Secondary | ICD-10-CM

## 2017-09-14 NOTE — Progress Notes (Signed)
Patient is a very pleasant 77 year old gentleman who is 7 weeks status post a nondisplaced right knee tibial plateau fracture.  He is from [place] and we try to get him follow-up with Dr. Aline Brochure or Dr. Luna Glasgow up there.  According to the patient though he said he called that office and they said they had no access to our records which I find suspect since were all in the same computerized medical record system.  The patient needed follow-up there because he has a very hard time with getting a ride down here and I have dictated my last note that that office could charge a fracture code.  However they did not see him and he is down here today.  His knee is doing much better overall.  He is been in a knee immobilizer.  On exam of the knee immobilizer he does flex and extend his knee much easier than he did before.  His swelling is minimal.  He has minimal pain when I stretch his knee or when I flex and extend it.  More of his pain is in his right hip.  He does have a hemiarthroplasty on that side.  An AP and lateral of the right knee are obtained and show that the tibial plateau fracture is healed.  His knee joint is well-maintained and space is well-maintained.  At this point we will try a hinged knee brace just for comfort and support.  He can come out of this as comfort allows.  All question concerns were answered and addressed.  Follow-up will be as needed.

## 2017-09-22 DIAGNOSIS — M545 Low back pain: Secondary | ICD-10-CM | POA: Diagnosis not present

## 2017-09-22 DIAGNOSIS — Z79899 Other long term (current) drug therapy: Secondary | ICD-10-CM | POA: Diagnosis not present

## 2017-09-22 DIAGNOSIS — J449 Chronic obstructive pulmonary disease, unspecified: Secondary | ICD-10-CM | POA: Diagnosis not present

## 2017-09-24 DIAGNOSIS — H90A31 Mixed conductive and sensorineural hearing loss, unilateral, right ear with restricted hearing on the contralateral side: Secondary | ICD-10-CM | POA: Diagnosis not present

## 2017-09-24 DIAGNOSIS — H90A22 Sensorineural hearing loss, unilateral, left ear, with restricted hearing on the contralateral side: Secondary | ICD-10-CM | POA: Diagnosis not present

## 2017-10-27 DIAGNOSIS — M25551 Pain in right hip: Secondary | ICD-10-CM | POA: Diagnosis not present

## 2017-10-27 DIAGNOSIS — Z79899 Other long term (current) drug therapy: Secondary | ICD-10-CM | POA: Diagnosis not present

## 2017-10-27 DIAGNOSIS — M545 Low back pain: Secondary | ICD-10-CM | POA: Diagnosis not present

## 2017-10-27 DIAGNOSIS — M25562 Pain in left knee: Secondary | ICD-10-CM | POA: Diagnosis not present

## 2017-10-27 DIAGNOSIS — I10 Essential (primary) hypertension: Secondary | ICD-10-CM | POA: Diagnosis not present

## 2017-10-27 DIAGNOSIS — J449 Chronic obstructive pulmonary disease, unspecified: Secondary | ICD-10-CM | POA: Diagnosis not present

## 2017-11-25 ENCOUNTER — Observation Stay (HOSPITAL_COMMUNITY)
Admission: EM | Admit: 2017-11-25 | Discharge: 2017-11-28 | Disposition: A | Discharge status: Home / Self Care | Payer: Medicare Other | Attending: Internal Medicine | Admitting: Internal Medicine

## 2017-11-25 ENCOUNTER — Emergency Department (HOSPITAL_COMMUNITY): Payer: Medicare Other

## 2017-11-25 ENCOUNTER — Encounter (HOSPITAL_COMMUNITY): Payer: Self-pay

## 2017-11-25 ENCOUNTER — Other Ambulatory Visit: Payer: Self-pay

## 2017-11-25 DIAGNOSIS — F17210 Nicotine dependence, cigarettes, uncomplicated: Secondary | ICD-10-CM | POA: Diagnosis not present

## 2017-11-25 DIAGNOSIS — Z96652 Presence of left artificial knee joint: Secondary | ICD-10-CM | POA: Diagnosis not present

## 2017-11-25 DIAGNOSIS — R918 Other nonspecific abnormal finding of lung field: Secondary | ICD-10-CM | POA: Diagnosis not present

## 2017-11-25 DIAGNOSIS — Z79891 Long term (current) use of opiate analgesic: Secondary | ICD-10-CM | POA: Insufficient documentation

## 2017-11-25 DIAGNOSIS — N3000 Acute cystitis without hematuria: Secondary | ICD-10-CM | POA: Insufficient documentation

## 2017-11-25 DIAGNOSIS — K219 Gastro-esophageal reflux disease without esophagitis: Secondary | ICD-10-CM | POA: Diagnosis not present

## 2017-11-25 DIAGNOSIS — I700 Atherosclerosis of aorta: Secondary | ICD-10-CM | POA: Diagnosis not present

## 2017-11-25 DIAGNOSIS — Z79899 Other long term (current) drug therapy: Secondary | ICD-10-CM | POA: Diagnosis not present

## 2017-11-25 DIAGNOSIS — I252 Old myocardial infarction: Secondary | ICD-10-CM | POA: Diagnosis not present

## 2017-11-25 DIAGNOSIS — B9620 Unspecified Escherichia coli [E. coli] as the cause of diseases classified elsewhere: Secondary | ICD-10-CM | POA: Insufficient documentation

## 2017-11-25 DIAGNOSIS — G8929 Other chronic pain: Secondary | ICD-10-CM | POA: Diagnosis not present

## 2017-11-25 DIAGNOSIS — R55 Syncope and collapse: Secondary | ICD-10-CM | POA: Diagnosis not present

## 2017-11-25 DIAGNOSIS — I959 Hypotension, unspecified: Secondary | ICD-10-CM | POA: Insufficient documentation

## 2017-11-25 DIAGNOSIS — D72829 Elevated white blood cell count, unspecified: Secondary | ICD-10-CM

## 2017-11-25 DIAGNOSIS — I951 Orthostatic hypotension: Secondary | ICD-10-CM

## 2017-11-25 DIAGNOSIS — J329 Chronic sinusitis, unspecified: Secondary | ICD-10-CM | POA: Diagnosis not present

## 2017-11-25 DIAGNOSIS — Z96641 Presence of right artificial hip joint: Secondary | ICD-10-CM | POA: Diagnosis not present

## 2017-11-25 DIAGNOSIS — Z9842 Cataract extraction status, left eye: Secondary | ICD-10-CM | POA: Diagnosis not present

## 2017-11-25 DIAGNOSIS — I10 Essential (primary) hypertension: Secondary | ICD-10-CM | POA: Diagnosis not present

## 2017-11-25 DIAGNOSIS — Z8673 Personal history of transient ischemic attack (TIA), and cerebral infarction without residual deficits: Secondary | ICD-10-CM | POA: Insufficient documentation

## 2017-11-25 DIAGNOSIS — E785 Hyperlipidemia, unspecified: Secondary | ICD-10-CM | POA: Diagnosis not present

## 2017-11-25 DIAGNOSIS — Z9841 Cataract extraction status, right eye: Secondary | ICD-10-CM | POA: Diagnosis not present

## 2017-11-25 DIAGNOSIS — E7800 Pure hypercholesterolemia, unspecified: Secondary | ICD-10-CM | POA: Insufficient documentation

## 2017-11-25 DIAGNOSIS — R0789 Other chest pain: Secondary | ICD-10-CM | POA: Diagnosis not present

## 2017-11-25 DIAGNOSIS — R079 Chest pain, unspecified: Secondary | ICD-10-CM | POA: Diagnosis not present

## 2017-11-25 DIAGNOSIS — I2510 Atherosclerotic heart disease of native coronary artery without angina pectoris: Secondary | ICD-10-CM | POA: Diagnosis present

## 2017-11-25 DIAGNOSIS — Z886 Allergy status to analgesic agent status: Secondary | ICD-10-CM | POA: Insufficient documentation

## 2017-11-25 DIAGNOSIS — J982 Interstitial emphysema: Secondary | ICD-10-CM | POA: Diagnosis not present

## 2017-11-25 DIAGNOSIS — J439 Emphysema, unspecified: Secondary | ICD-10-CM

## 2017-11-25 DIAGNOSIS — N390 Urinary tract infection, site not specified: Secondary | ICD-10-CM

## 2017-11-25 DIAGNOSIS — J449 Chronic obstructive pulmonary disease, unspecified: Secondary | ICD-10-CM | POA: Diagnosis present

## 2017-11-25 DIAGNOSIS — Z720 Tobacco use: Secondary | ICD-10-CM | POA: Diagnosis present

## 2017-11-25 HISTORY — DX: Tobacco use: Z72.0

## 2017-11-25 LAB — CBC WITH DIFFERENTIAL/PLATELET
Abs Immature Granulocytes: 0.17 10*3/uL — ABNORMAL HIGH (ref 0.00–0.07)
BASOS ABS: 0.1 10*3/uL (ref 0.0–0.1)
BASOS PCT: 0 %
EOS ABS: 0 10*3/uL (ref 0.0–0.5)
Eosinophils Relative: 0 %
HCT: 44.1 % (ref 39.0–52.0)
HEMOGLOBIN: 14.2 g/dL (ref 13.0–17.0)
IMMATURE GRANULOCYTES: 1 %
LYMPHS ABS: 3.5 10*3/uL (ref 0.7–4.0)
Lymphocytes Relative: 13 %
MCH: 30.4 pg (ref 26.0–34.0)
MCHC: 32.2 g/dL (ref 30.0–36.0)
MCV: 94.4 fL (ref 80.0–100.0)
Monocytes Absolute: 1.2 10*3/uL — ABNORMAL HIGH (ref 0.1–1.0)
Monocytes Relative: 4 %
NEUTROS PCT: 82 %
NRBC: 0 % (ref 0.0–0.2)
Neutro Abs: 22.6 10*3/uL — ABNORMAL HIGH (ref 1.7–7.7)
Platelets: 412 10*3/uL — ABNORMAL HIGH (ref 150–400)
RBC: 4.67 MIL/uL (ref 4.22–5.81)
RDW: 16.8 % — ABNORMAL HIGH (ref 11.5–15.5)
WBC: 27.5 10*3/uL — ABNORMAL HIGH (ref 4.0–10.5)

## 2017-11-25 LAB — BASIC METABOLIC PANEL
Anion gap: 9 (ref 5–15)
BUN: 24 mg/dL — AB (ref 8–23)
CHLORIDE: 105 mmol/L (ref 98–111)
CO2: 24 mmol/L (ref 22–32)
Calcium: 8.7 mg/dL — ABNORMAL LOW (ref 8.9–10.3)
Creatinine, Ser: 1.04 mg/dL (ref 0.61–1.24)
GFR calc Af Amer: >60 mL/min (ref >60)
GFR calc non Af Amer: >60 mL/min (ref >60)
Glucose, Bld: 118 mg/dL — ABNORMAL HIGH (ref 70–99)
POTASSIUM: 3.8 mmol/L (ref 3.5–5.1)
SODIUM: 138 mmol/L (ref 135–145)

## 2017-11-25 LAB — URINALYSIS, ROUTINE W REFLEX MICROSCOPIC
GLUCOSE, UA: NEGATIVE mg/dL
Hgb urine dipstick: NEGATIVE
Ketones, ur: NEGATIVE mg/dL
Nitrite: NEGATIVE
PROTEIN: 30 mg/dL — AB
Specific Gravity, Urine: 1.021 (ref 1.005–1.030)
pH: 5 (ref 5.0–8.0)

## 2017-11-25 LAB — INFLUENZA PANEL BY PCR (TYPE A & B)
Influenza A By PCR: NEGATIVE
Influenza B By PCR: NEGATIVE

## 2017-11-25 LAB — TROPONIN I

## 2017-11-25 LAB — I-STAT CG4 LACTIC ACID, ED: LACTIC ACID, VENOUS: 0.96 mmol/L (ref 0.5–1.9)

## 2017-11-25 LAB — I-STAT TROPONIN, ED: TROPONIN I, POC: 0.01 ng/mL (ref 0.00–0.08)

## 2017-11-25 LAB — CK: CK TOTAL: 42 U/L — AB (ref 49–397)

## 2017-11-25 MED ORDER — SODIUM CHLORIDE 0.9 % IV SOLN
INTRAVENOUS | Status: DC
  Administered 2017-11-25: 17:00:00 via INTRAVENOUS

## 2017-11-25 MED ORDER — MOMETASONE FURO-FORMOTEROL FUM 200-5 MCG/ACT IN AERO
2.0000 | INHALATION_SPRAY | Freq: Two times a day (BID) | RESPIRATORY_TRACT | Status: DC
  Administered 2017-11-26 – 2017-11-28 (×5): 2 via RESPIRATORY_TRACT
  Filled 2017-11-25 (×2): qty 8.8

## 2017-11-25 MED ORDER — SODIUM CHLORIDE 0.9 % IV SOLN: 1.0000 g | INTRAVENOUS | Status: DC

## 2017-11-25 MED ORDER — ALBUTEROL SULFATE HFA 108 (90 BASE) MCG/ACT IN AERS
2.0000 | INHALATION_SPRAY | RESPIRATORY_TRACT | Status: DC | PRN
  Filled 2017-11-25: qty 6.7

## 2017-11-25 MED ORDER — NITROGLYCERIN 0.4 MG SL SUBL: 0.4000 mg | SUBLINGUAL_TABLET | SUBLINGUAL | Status: DC | PRN

## 2017-11-25 MED ORDER — POLYETHYLENE GLYCOL 3350 17 G PO PACK: 17.0000 g | PACK | Freq: Every day | ORAL | Status: DC | PRN

## 2017-11-25 MED ORDER — SODIUM CHLORIDE 0.9 % IV SOLN
1.0000 g | INTRAVENOUS | Status: DC
  Administered 2017-11-26 – 2017-11-27 (×2): 1 g via INTRAVENOUS
  Filled 2017-11-25 (×3): qty 10

## 2017-11-25 MED ORDER — MORPHINE SULFATE (PF) 4 MG/ML IV SOLN
4.0000 mg | INTRAVENOUS | Status: DC | PRN
  Administered 2017-11-25: 4 mg via INTRAVENOUS
  Filled 2017-11-25: qty 1

## 2017-11-25 MED ORDER — SODIUM CHLORIDE 0.9 % IV BOLUS
1000.0000 mL | Freq: Once | INTRAVENOUS | Status: AC
  Administered 2017-11-25: 1000 mL via INTRAVENOUS

## 2017-11-25 MED ORDER — SODIUM CHLORIDE 0.9 % IV SOLN
1.0000 g | Freq: Once | INTRAVENOUS | Status: AC
  Administered 2017-11-25: 1 g via INTRAVENOUS
  Filled 2017-11-25: qty 10

## 2017-11-25 MED ORDER — ACETAMINOPHEN 650 MG RE SUPP: 650.0000 mg | Freq: Four times a day (QID) | RECTAL | Status: DC | PRN

## 2017-11-25 MED ORDER — ACETAMINOPHEN 325 MG PO TABS
650.0000 mg | ORAL_TABLET | Freq: Four times a day (QID) | ORAL | Status: DC | PRN
  Administered 2017-11-27: 650 mg via ORAL
  Filled 2017-11-25: qty 2

## 2017-11-25 MED ORDER — IOPAMIDOL (ISOVUE-370) INJECTION 76%
100.0000 mL | Freq: Once | INTRAVENOUS | Status: AC | PRN
  Administered 2017-11-25: 100 mL via INTRAVENOUS

## 2017-11-25 MED ORDER — ONDANSETRON HCL 4 MG PO TABS: 4.0000 mg | ORAL_TABLET | Freq: Four times a day (QID) | ORAL | Status: DC | PRN

## 2017-11-25 MED ORDER — POTASSIUM CHLORIDE IN NACL 20-0.9 MEQ/L-% IV SOLN
INTRAVENOUS | Status: DC
  Administered 2017-11-25: 23:00:00 via INTRAVENOUS
  Filled 2017-11-25: qty 1000

## 2017-11-25 MED ORDER — ONDANSETRON HCL 4 MG/2ML IJ SOLN: 4.0000 mg | Freq: Four times a day (QID) | INTRAMUSCULAR | Status: DC | PRN

## 2017-11-25 NOTE — ED Provider Notes (Signed)
[facility] EMERGENCY DEPARTMENT Provider Note   CSN: [id number] Arrival date & time: 11/25/17  1511     History   Chief Complaint Chief Complaint  Patient presents with  . Chest Pain    HPI Bradley [name] is a 77 y.o. male.  HPI Pt was seen at 1550. Per pt and his daughter, c/o gradual onset and persistence of constant "memory issues" for the past several months. Pt's daughter states she "has been telling his doctor but they're not doing anything." Pt also c/o multiple episodes of mid-sternal chest "pains" for the past 3 days. Pt states the CP occurs on exertion, has been associated with SOB. Pt states his symptoms last for approximately 1 to 2 hours before resolving after he takes his SL ntg. Pt also c/o recurrent lightheadedness and several syncopal episodes over the past 3 days. Pt states the CP/SOB and lightheadedness do not occur together. EMS gave ASA and SL ntg with partial improvement of his CP. Pt denies any new meds. Denies palpitations, no cough, no abd pain, no N/V/D, no fevers, no rash, no falls, no back pain, no headache, no visual changes, no focal motor weakness, no tingling/numbness in extremities, no ataxia, no slurred speech, no facial droop.     Past Medical History:  Diagnosis Date  . Chronic lower back pain   . Contact with stonefish as cause of accidental injury 1954   "stung across my left hand in [place]; LUE weaker since"  . Coronary artery disease   . High cholesterol   . History of blood transfusion 1943  . Hypertension   . Migraine    "none since the 1990s" (09/11/2016)  . Myocardial infarction ([place]) 01/20/1993; 02/28/1993  . Pneumonia    "several times" (09/11/2016)    Patient Active Problem List   Diagnosis Date Noted  . Rt Hip fracture ([place]) 12/19/2016  . Chest pain 09/12/2016  . Leukocytosis 09/12/2016  . Abnormal chest x-ray 09/12/2016  . Hyperlipidemia 09/12/2016  . Hypokalemia 09/12/2016  . Precordial pain   . Tobacco abuse 09/11/2016    . Emphysema, unspecified ([place]) 09/11/2016  . CAD (coronary artery disease), native coronary artery 09/11/2016  . Chronic pain syndrome 09/11/2016  . Essential hypertension 09/11/2016  . Right-sided epistaxis 03/15/2016    Past Surgical History:  Procedure Laterality Date  . APPENDECTOMY    . CARDIAC CATHETERIZATION    . CATARACT EXTRACTION, BILATERAL Bilateral   . HIP ARTHROPLASTY Right 12/20/2016   Procedure: ARTHROPLASTY BIPOLAR HIP (HEMIARTHROPLASTY);  Surgeon: [name], MD;  Location: [facility];  Service: Orthopedics;  Laterality: Right;  . JOINT REPLACEMENT    . LEFT HEART CATH AND CORONARY ANGIOGRAPHY N/A 09/12/2016   Procedure: LEFT HEART CATH AND CORONARY ANGIOGRAPHY;  Surgeon: Burnell Blanks, MD;  Location: [facility];  Service: Cardiovascular;  Laterality: N/A;  . NASAL ENDOSCOPY WITH EPISTAXIS CONTROL Right 03/15/2016   Procedure: NASAL ENDOSCOPY WITH EPISTAXIS CONTROL;  Surgeon: Jodi Marble, MD;  Location: [facility];  Service: ENT;  Laterality: Right;  . SEPTOPLASTY Right 03/15/2016   Procedure: SEPTOPLASTY;  Surgeon: Jodi Marble, MD;  Location: [facility];  Service: ENT;  Laterality: Right;  . SINUS EXPLORATION Right 03/15/2016   Procedure: SINUS EXPLORATION;  Surgeon: Jodi Marble, MD;  Location: [facility];  Service: ENT;  Laterality: Right;  . TONSILLECTOMY    . TOOTH EXTRACTION    . TOTAL KNEE ARTHROPLASTY Left         Home Medications    Prior  to Admission medications   Medication Sig Start Date End Date Taking? Authorizing Provider  ADVAIR HFA 115-21 MCG/ACT inhaler Inhale 2 puffs into the lungs every 12 (twelve) hours. 01/29/16   [provider]  amLODipine (NORVASC) 5 MG tablet TAKE 1 TABLET BY MOUTH EVERY DAY 08/24/17   Lendon Colonel, NP  aspirin EC 81 MG tablet Take 1 tablet by mouth daily.    [provider]  celecoxib (CELEBREX) 200 MG capsule Take 1 capsule (200 mg total) by mouth daily. 12/22/16   [name], MD   cyclobenzaprine (FLEXERIL) 5 MG tablet Take 5 mg by mouth 3 (three) times daily as needed for muscle spasms.    [provider]  Ferrous Sulfate (IRON) 325 (65 Fe) MG TABS Take 1 tablet by mouth daily. 12/10/15   [provider]  gabapentin (NEURONTIN) 300 MG capsule Take 1 capsule by mouth 3 (three) times daily. 08/15/16   [provider]  KLOR-CON M20 20 MEQ tablet TAKE 1 TABLET BY MOUTH EVERY DAY 04/20/17   Lendon Colonel, NP  lisinopril (PRINIVIL,ZESTRIL) 10 MG tablet TAKE 1 TABLET BY MOUTH EVERY DAY 08/24/17   Lendon Colonel, NP  metoCLOPramide (REGLAN) 5 MG tablet Take 1 tablet by mouth 4 (four) times daily -  before meals and at bedtime. 01/25/16   [provider]  metoprolol succinate (TOPROL-XL) 25 MG 24 hr tablet Take 25 mg by mouth daily.    [provider]  nitroGLYCERIN (NITROSTAT) 0.4 MG SL tablet Place 0.4 mg under the tongue every 5 (five) minutes as needed for chest pain.    [provider]  omeprazole (PRILOSEC) 40 MG capsule Take 40 mg by mouth daily.     [provider]  Oxycodone HCl 10 MG TABS Take 10 mg by mouth 3 (three) times daily.    [provider]  potassium gluconate (HM POTASSIUM) 595 (99 K) MG TABS tablet Take 595 mg by mouth daily.    [provider]  simvastatin (ZOCOR) 10 MG tablet Take 10 mg by mouth daily. 12/23/15   [provider]  VENTOLIN HFA 108 (90 Base) MCG/ACT inhaler Inhale 2 puffs into the lungs every 4 (four) hours as needed for wheezing or shortness of breath.  12/11/15   [provider]    Family History Family History  Adopted: Yes  Problem Relation Age of Onset  . Cancer Mother   . Obesity Father     Social History Social History   Tobacco Use  . Smoking status: Current Every Day Smoker    Packs/day: 1.00    Years: 68.00    Pack years: 68.00    Types: Cigarettes  . Smokeless tobacco: Never Used  Substance Use Topics  .  Alcohol use: No    Comment: 09/11/2016 "drank from the age of 89 til 34"  . Drug use: No     Allergies   Aspirin   Review of Systems Review of Systems ROS: Statement: All systems negative except as marked or noted in the HPI; Constitutional: Negative for fever and chills. ; ; Eyes: Negative for eye pain, redness and discharge. ; ; ENMT: Negative for ear pain, hoarseness, nasal congestion, sinus pressure and sore throat. ; ; Cardiovascular: +CP, SOB. Negative for palpitations, diaphoresis, and peripheral edema. ; ; Respiratory: Negative for cough, wheezing and stridor. ; ; Gastrointestinal: Negative for nausea, vomiting, diarrhea, abdominal pain, blood in stool, hematemesis, jaundice and rectal bleeding. . ; ; Genitourinary: Negative  for dysuria, flank pain and hematuria. ; ; Musculoskeletal: Negative for back pain and neck pain. Negative for swelling and trauma.; ; Skin: Negative for pruritus, rash, abrasions, blisters, bruising and skin lesion.; ; Neuro: +lightheadedness, "memory issues."  Negative for headache and neck stiffness. Negative for weakness, altered level of consciousness, altered mental status, extremity weakness, paresthesias, involuntary movement, seizure and syncope.       Physical Exam Updated Vital Signs BP 101/65   Pulse 84   Temp 98.3 F (36.8 C) (Oral)   Resp (!) 23   Ht 6' (1.829 m)   Wt 68 kg   SpO2 93%   BMI 20.34 kg/m    Patient Vitals for the past 24 hrs:  BP Temp Temp src Pulse Resp SpO2 Height Weight  11/25/17 1830 109/76 - - 74 17 92 % - -  11/25/17 1730 124/88 - - 72 15 94 % - -  11/25/17 1700 138/63 - - 75 17 91 % - -  11/25/17 1645 - - - 77 20 95 % - -  11/25/17 1630 124/66 - - 75 16 92 % - -  11/25/17 1600 101/65 - - - (!) 23 - - -  11/25/17 1530 95/78 - - 84 19 93 % - -  11/25/17 1525 (!) 88/55 98.3 F (36.8 C) Oral 85 16 91 % - -  11/25/17 1514 - - - - - - 6' (1.829 m) 68 kg   17:16 Orthostatic Vital Signs LN  Orthostatic Lying   BP-  Lying: 137/63   Pulse- Lying: 74       Orthostatic Sitting  BP- Sitting: 128/71   Pulse- Sitting: 76       Orthostatic Standing at 0 minutes  BP- Standing at 0 minutes: 105/63   Pulse- Standing at 0 minutes: 80      Physical Exam 1555: Physical examination:  Nursing notes reviewed; Vital signs and O2 SAT reviewed;  Constitutional: Well developed, Well nourished, In no acute distress; Head:  Normocephalic, atraumatic; Eyes: EOMI, PERRL, No scleral icterus; ENMT: Mouth and pharynx normal, Mucous membranes dry; Neck: Supple, Full range of motion, No lymphadenopathy; Cardiovascular: Regular rate and rhythm, No gallop; Respiratory: Breath sounds clear & equal bilaterally, No wheezes.  Speaking full sentences with ease, Normal respiratory effort/excursion; Chest: Nontender, Movement normal; Abdomen: Soft, Nontender, Nondistended, Normal bowel sounds; Genitourinary: No CVA tenderness; Extremities: Peripheral pulses normal, No tenderness, No edema, No calf edema or asymmetry.; Neuro: AA&Ox3, Major CN grossly intact. No facial droop. Speech clear. Grips equal. No gross focal motor or sensory deficits in extremities.; Skin: Color normal, Warm, Dry.   ED Treatments / Results  Labs (all labs ordered are listed, but only abnormal results are displayed)   EKG EKG Interpretation  Date/Time:  Wednesday November 25 2017 15:23:52 EDT Ventricular Rate:  85 PR Interval:    QRS Duration: 95 QT Interval:  374 QTC Calculation: 445 R Axis:   -38 Text Interpretation:  Sinus rhythm Left axis deviation Probable anteroseptal infarct, old When compared with ECG of 12/22/2016 No significant change was found Confirmed by Francine Graven [phone]) on 11/25/2017 4:03:55 PM   Radiology   Procedures Procedures (including critical care time)  Medications Ordered in ED Medications  sodium chloride 0.9 % bolus 1,000 mL (1,000 mLs Intravenous New Bag/Given 11/25/17 1609)  nitroGLYCERIN (NITROSTAT) SL tablet  0.4 mg (has no administration in time range)  morphine 4 MG/ML injection 4 mg (4 mg Intravenous Given 11/25/17 1609)     Initial  Impression / Assessment and Plan / ED Course  I have reviewed the triage vital signs and the nursing notes.  Pertinent labs & imaging results that were available during my care of the patient were reviewed by me and considered in my medical decision making (see chart for details).  MDM Reviewed: previous chart, nursing note and vitals Reviewed previous: labs and ECG Interpretation: labs, ECG, x-ray and CT scan Total time providing critical care: 30-74 minutes. This excludes time spent performing separately reportable procedures and services. Consults: admitting MD   CRITICAL CARE Performed by: Francine Graven Total critical care time: 35 minutes Critical care time was exclusive of separately billable procedures and treating other patients. Critical care was necessary to treat or prevent imminent or life-threatening deterioration. Critical care was time spent personally by me on the following activities: development of treatment plan with patient and/or surrogate as well as nursing, discussions with consultants, evaluation of patient's response to treatment, examination of patient, obtaining history from patient or surrogate, ordering and performing treatments and interventions, ordering and review of laboratory studies, ordering and review of radiographic studies, pulse oximetry and re-evaluation of patient's condition.   Results for orders placed or performed during the hospital encounter of 09/32/67  Basic metabolic panel  Result Value Ref Range   Sodium 138 135 - 145 mmol/L   Potassium 3.8 3.5 - 5.1 mmol/L   Chloride 105 98 - 111 mmol/L   CO2 24 22 - 32 mmol/L   Glucose, Bld 118 (H) 70 - 99 mg/dL   BUN 24 (H) 8 - 23 mg/dL   Creatinine, Ser 1.04 0.61 - 1.24 mg/dL   Calcium 8.7 (L) 8.9 - 10.3 mg/dL   GFR calc non Af Amer >60 >60 mL/min   GFR calc Af  Amer >60 >60 mL/min   Anion gap 9 5 - 15  CBC with Differential  Result Value Ref Range   WBC 27.5 (H) 4.0 - 10.5 K/uL   RBC 4.67 4.22 - 5.81 MIL/uL   Hemoglobin 14.2 13.0 - 17.0 g/dL   HCT 44.1 39.0 - 52.0 %   MCV 94.4 80.0 - 100.0 fL   MCH 30.4 26.0 - 34.0 pg   MCHC 32.2 30.0 - 36.0 g/dL   RDW 16.8 (H) 11.5 - 15.5 %   Platelets 412 (H) 150 - 400 K/uL   nRBC 0.0 0.0 - 0.2 %   Neutrophils Relative % 82 %   Neutro Abs 22.6 (H) 1.7 - 7.7 K/uL   Lymphocytes Relative 13 %   Lymphs Abs 3.5 0.7 - 4.0 K/uL   Monocytes Relative 4 %   Monocytes Absolute 1.2 (H) 0.1 - 1.0 K/uL   Eosinophils Relative 0 %   Eosinophils Absolute 0.0 0.0 - 0.5 K/uL   Basophils Relative 0 %   Basophils Absolute 0.1 0.0 - 0.1 K/uL   Immature Granulocytes 1 %   Abs Immature Granulocytes 0.17 (H) 0.00 - 0.07 K/uL  Urinalysis, Routine w reflex microscopic  Result Value Ref Range   Color, Urine AMBER (A) YELLOW   APPearance CLOUDY (A) CLEAR   Specific Gravity, Urine 1.021 1.005 - 1.030   pH 5.0 5.0 - 8.0   Glucose, UA NEGATIVE NEGATIVE mg/dL   Hgb urine dipstick NEGATIVE NEGATIVE   Bilirubin Urine SMALL (A) NEGATIVE   Ketones, ur NEGATIVE NEGATIVE mg/dL   Protein, ur 30 (A) NEGATIVE mg/dL   Nitrite NEGATIVE NEGATIVE   Leukocytes, UA MODERATE (A) NEGATIVE   RBC / HPF 0-5 0 -  5 RBC/hpf   WBC, UA >50 (H) 0 - 5 WBC/hpf   Bacteria, UA FEW (A) NONE SEEN   Squamous Epithelial / LPF 0-5 0 - 5   Mucus PRESENT   Influenza panel by PCR (type A & B)  Result Value Ref Range   Influenza A By PCR NEGATIVE NEGATIVE   Influenza B By PCR NEGATIVE NEGATIVE  I-stat troponin, ED  Result Value Ref Range   Troponin i, poc 0.01 0.00 - 0.08 ng/mL   Comment 3          I-Stat CG4 Lactic Acid, ED  Result Value Ref Range   Lactic Acid, Venous 0.96 0.5 - 1.9 mmol/L   Dg Chest 2 View Result Date: 11/25/2017 CLINICAL DATA:  Chest pain EXAM: CHEST - 2 VIEW COMPARISON:  08/08/2017 FINDINGS: Chronic interstitial opacities  throughout the lungs, likely chronic lung disease/fibrosis. Heart is normal size. No effusions or acute bony abnormality. 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 Electronically Signed   By: Rolm Baptise M.D.   On: 11/25/2017 15:58   Ct Head Wo Contrast Result Date: 11/25/2017 CLINICAL DATA:  Chest pain for the past 3 days. Altered mental status. Recurrent syncope. EXAM: CT HEAD WITHOUT CONTRAST TECHNIQUE: Contiguous axial images were obtained from the base of the skull through the vertex without intravenous contrast. COMPARISON:  Maxillofacial CT dated 03/15/2016. FINDINGS: Brain: Old right frontal lobe infarct. Mild-to-moderate diffuse enlargement of the ventricles and subarachnoid spaces. Moderate patchy white matter low density in both cerebral hemispheres. No intracranial hemorrhage, mass lesion or CT evidence of acute infarction. Vascular: No hyperdense vessel or unexpected calcification. Skull: Normal. Negative for fracture or focal lesion. Sinuses/Orbits: Status post bilateral cataract extraction. Mucosal thickening and fluid in the right maxillary sinus with mild improvement. Resolved opacification of the right sphenoid sinus. Significantly improved right ethmoid sinus mucosal thickening. Other: None. IMPRESSION: 1. No acute abnormality. 2. Old right frontal lobe infarct. 3. Mild to moderate diffuse cerebral and cerebellar atrophy. 4. Moderate chronic small vessel white matter ischemic changes in both cerebral hemispheres. 5. Improved sinusitis. Electronically Signed   By: Claudie Revering M.D.   On: 11/25/2017 17:16    1555:  Pt and his daughter proceed to tell me about pt's ongoing "memory issues" for the past several months. Pt states he "doesn't even remember the past few days" but then proceeds to tell me in detail the events of the past several days. Pt's neuro exam intact otherwise in the ED; no focal deficits. Doubt need  for emergent MRI at this time. Will dose IVF bolus for hypotension.   1850:  BP improving with IVF bolus. Remains orthostatic on VS. Leukocytosis on WBC count, but lactic acid normal and pt remains afebrile. +UTI, UC pending; IV rocephin given. Dx and testing d/w pt and family.  Questions answered.  Verb understanding, agreeable to admit.  T/C returned from [facility]. Denton Brick, case discussed, including:  HPI, pertinent PM/SHx, VS/PE, dx testing, ED course and treatment:  Agreeable to admit.     Final Clinical Impressions(s) / ED Diagnoses   Final diagnoses:  None    ED Discharge Orders    None       Francine Graven, DO 11/26/17 1335

## 2017-11-25 NOTE — ED Notes (Signed)
Hospitalist at bedside 

## 2017-11-25 NOTE — H&P (Signed)
History and Physical    Bradley [name] PFX:[id number] DOB: November 23, 1940 DOA: 11/25/2017  PCP: Jani Gravel, MD   Patient coming from: Home  Chief Complaint: Chest PAin  HPI: Bradley [name] is a 77 y.o. male with medical history significant for CAD, HTN, alcohol abuse who presented to the ED with complaints of chest pain x 3 days, with associated lightheadedness and shortness of breath.  Chest pain is unrelated to activity or meals.  Patient reports 3 days ago on Sunday he was lying in bed sitting up getting ready to go to church, passed out and he woke up today.  He says he really has no recollection of what has happened in the past 3 days, so he has not eaten or drank in the past 3 days but he says he has had chest pain for 3 days.  Patient smokes 1 pack/day.  Reports also dysuria over the past few days, and chronic increased urination.  His daughter reported to ED provider that she is concerned about patient's memory.  ED Course: HR hypotension systolic 32/99 improved with 1 L fluid bolus.  Heart rate 70s to 80s.  O2 sats greater than 91% on room air.  WBC marked elevation at 27, increased absolute neutrophil count.  Unremarkable BMP.  UA moderate leukocytes.  Normal lactic acid 0.96.  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  EKG sinus rhythm insignificant Q waves 1 aVL V6.  Normal intervals.  Troponin unremarkable.  Hospitalist was called to admit for chest pain, UTI.   Review of Systems: As per HPI all other systems reviewed and negative  Past Medical History:  Diagnosis Date  . Chronic lower back pain   . Contact with stonefish as cause of accidental injury 1954   "stung across my left hand in [place]; LUE weaker since"  . Coronary artery disease   . High cholesterol   . History of blood transfusion 1943  . Hypertension   . Migraine    "none since the 1990s" (09/11/2016)  . Myocardial infarction ([place]) 01/20/1993; 02/28/1993  . Pneumonia    "several times"  (09/11/2016)    Past Surgical History:  Procedure Laterality Date  . APPENDECTOMY    . CARDIAC CATHETERIZATION    . CATARACT EXTRACTION, BILATERAL Bilateral   . HIP ARTHROPLASTY Right 12/20/2016   Procedure: ARTHROPLASTY BIPOLAR HIP (HEMIARTHROPLASTY);  Surgeon: [name], MD;  Location: [place];  Service: Orthopedics;  Laterality: Right;  . JOINT REPLACEMENT    . LEFT HEART CATH AND CORONARY ANGIOGRAPHY N/A 09/12/2016   Procedure: LEFT HEART CATH AND CORONARY ANGIOGRAPHY;  Surgeon: Burnell Blanks, MD;  Location: [facility];  Service: Cardiovascular;  Laterality: N/A;  . NASAL ENDOSCOPY WITH EPISTAXIS CONTROL Right 03/15/2016   Procedure: NASAL ENDOSCOPY WITH EPISTAXIS CONTROL;  Surgeon: Jodi Marble, MD;  Location: [facility];  Service: ENT;  Laterality: Right;  . SEPTOPLASTY Right 03/15/2016   Procedure: SEPTOPLASTY;  Surgeon: Jodi Marble, MD;  Location: [place];  Service: ENT;  Laterality: Right;  . SINUS EXPLORATION Right 03/15/2016   Procedure: SINUS EXPLORATION;  Surgeon: Jodi Marble, MD;  Location: [place];  Service: ENT;  Laterality: Right;  . TONSILLECTOMY    . TOOTH EXTRACTION    . TOTAL KNEE ARTHROPLASTY Left      reports that he has been smoking cigarettes. He has a 68.00 pack-year smoking history. He has never used smokeless tobacco. He reports that he does not drink alcohol or use drugs.  Allergies  Allergen  Reactions  . Aspirin     Regular asa     Family History  Adopted: Yes  Problem Relation Age of Onset  . Cancer Mother   . Obesity Father     Prior to Admission medications   Medication Sig Start Date End Date Taking? Authorizing Provider  ADVAIR HFA 115-21 MCG/ACT inhaler Inhale 2 puffs into the lungs every 12 (twelve) hours. 01/29/16  Yes [provider]  amLODipine (NORVASC) 5 MG tablet TAKE 1 TABLET BY MOUTH EVERY DAY Patient taking differently: Take 5 mg by mouth daily.  08/24/17  Yes Lendon Colonel, NP  Ascorbic Acid (VITAMIN C)  500 MG CAPS Take 500 mg by mouth daily.   Yes [provider]  aspirin EC 81 MG tablet Take 1 tablet by mouth daily.   Yes [provider]  celecoxib (CELEBREX) 200 MG capsule Take 1 capsule (200 mg total) by mouth daily. 12/22/16  Yes [name], MD  cyclobenzaprine (FLEXERIL) 5 MG tablet Take 5 mg by mouth 2 (two) times daily.    Yes [provider]  Ferrous Sulfate (IRON) 325 (65 Fe) MG TABS Take 1 tablet by mouth daily. 12/10/15  Yes [provider]  gabapentin (NEURONTIN) 300 MG capsule Take 1 capsule by mouth 3 (three) times daily. 08/15/16  Yes [provider]  lisinopril (PRINIVIL,ZESTRIL) 10 MG tablet TAKE 1 TABLET BY MOUTH EVERY DAY Patient taking differently: Take 10 mg by mouth daily.  08/24/17  Yes Lendon Colonel, NP  metoCLOPramide (REGLAN) 5 MG tablet Take 1 tablet by mouth 4 (four) times daily -  before meals and at bedtime. 01/25/16  Yes [provider]  metoprolol succinate (TOPROL-XL) 25 MG 24 hr tablet Take 25 mg by mouth daily.   Yes [provider]  mirabegron ER (MYRBETRIQ) 25 MG TB24 tablet Take 25 mg by mouth daily.   Yes [provider]  nitroGLYCERIN (NITROSTAT) 0.4 MG SL tablet Place 0.4 mg under the tongue every 5 (five) minutes as needed for chest pain.   Yes [provider]  omeprazole (PRILOSEC) 40 MG capsule Take 40 mg by mouth daily.    Yes [provider]  Oxycodone HCl 10 MG TABS Take 10 mg by mouth 4 (four) times daily.    Yes [provider]  potassium gluconate (HM POTASSIUM) 595 (99 K) MG TABS tablet Take 595 mg by mouth daily.   Yes [provider]  simvastatin (ZOCOR) 10 MG tablet Take 10 mg by mouth daily. 12/23/15  Yes [provider]  VENTOLIN HFA 108 (90 Base) MCG/ACT inhaler Inhale 2 puffs into the lungs every 4 (four) hours as needed for wheezing or shortness of breath.  12/11/15  Yes [provider]  KLOR-CON M20 20 MEQ  tablet TAKE 1 TABLET BY MOUTH EVERY DAY Patient not taking: Reported on 11/25/2017 04/20/17   Lendon Colonel, NP    Physical Exam: Vitals:   11/25/17 1730 11/25/17 1830 11/25/17 1900 11/25/17 1930  BP: 124/88 109/76 (!) 141/115 (!) 154/89  Pulse: 72 74 74 80  Resp: 15 17 15 14   Temp:      TempSrc:      SpO2: 94% 92% 93% 93%  Weight:      Height:        Constitutional: NAD, calm, comfortable Vitals:   11/25/17 1730 11/25/17 1830 11/25/17 1900 11/25/17 1930  BP: 124/88 109/76 (!) 141/115 (!) 154/89  Pulse: 72 74 74 80  Resp: 15  17 15 14   Temp:      TempSrc:      SpO2: 94% 92% 93% 93%  Weight:      Height:       Eyes: PERRL, lids and conjunctivae normal ENMT: Mucous membranes are  dry. Posterior pharynx clear of any exudate or lesions.  Neck: normal, supple, no masses, no thyromegaly Respiratory: clear to auscultation bilaterally, no wheezing, no crackles. Normal respiratory effort. No accessory muscle use.  Cardiovascular: Regular rate and rhythm, no murmurs / rubs / gallops. No extremity edema. 2+ pedal pulses.   Abdomen: no tenderness, no masses palpated. No hepatosplenomegaly. Bowel sounds positive.  Musculoskeletal: no clubbing / cyanosis. No joint deformity upper and lower extremities. Good ROM, no contractures. Normal muscle tone.  Skin: no rashes, lesions, ulcers. No induration Neurologic: CN 2-12 grossly intact.Strength 4/5 in all 4. Reports chronic lower extremity weakness from knee and hip surgeries, and reports left UE weakness with numbness- chronic. Psychiatric: Normal judgment and insight. Alert and oriented x 3. Normal mood.   Labs on Admission: I have personally reviewed following labs and imaging studies  CBC: Recent Labs  Lab 11/25/17 1530  WBC 27.5*  NEUTROABS 22.6*  HGB 14.2  HCT 44.1  MCV 94.4  PLT 003*   Basic Metabolic Panel: Recent Labs  Lab 11/25/17 1530  NA 138  K 3.8  CL 105  CO2 24  GLUCOSE 118*  BUN 24*  CREATININE 1.04    CALCIUM 8.7*   Urine analysis:    Component Value Date/Time   COLORURINE AMBER (A) 11/25/2017 1632   APPEARANCEUR CLOUDY (A) 11/25/2017 1632   LABSPEC 1.021 11/25/2017 1632   PHURINE 5.0 11/25/2017 1632   GLUCOSEU NEGATIVE 11/25/2017 1632   HGBUR NEGATIVE 11/25/2017 1632   BILIRUBINUR SMALL (A) 11/25/2017 1632   [place] 11/25/2017 1632   PROTEINUR 30 (A) 11/25/2017 1632   NITRITE NEGATIVE 11/25/2017 1632   LEUKOCYTESUR MODERATE (A) 11/25/2017 1632    Radiological Exams on Admission: Dg Chest 2 View  Result Date: 11/25/2017 CLINICAL DATA:  Chest pain EXAM: CHEST - 2 VIEW COMPARISON:  08/08/2017 FINDINGS: Chronic interstitial opacities throughout the lungs, likely chronic lung disease/fibrosis. Heart is normal size. No effusions or acute bony abnormality. 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 Electronically Signed   By: Rolm Baptise M.D.   On: 11/25/2017 15:58   Ct Head Wo Contrast  Result Date: 11/25/2017 CLINICAL DATA:  Chest pain for the past 3 days. Altered mental status. Recurrent syncope. EXAM: CT HEAD WITHOUT CONTRAST TECHNIQUE: Contiguous axial images were obtained from the base of the skull through the vertex without intravenous contrast. COMPARISON:  Maxillofacial CT dated 03/15/2016. FINDINGS: Brain: Old right frontal lobe infarct. Mild-to-moderate diffuse enlargement of the ventricles and subarachnoid spaces. Moderate patchy white matter low density in both cerebral hemispheres. No intracranial hemorrhage, mass lesion or CT evidence of acute infarction. Vascular: No hyperdense vessel or unexpected calcification. Skull: Normal. Negative for fracture or focal lesion. Sinuses/Orbits: Status post bilateral cataract extraction. Mucosal thickening and fluid in the right maxillary sinus with mild improvement. Resolved opacification of the right sphenoid sinus. Significantly improved right ethmoid  sinus mucosal thickening. Other: None. IMPRESSION: 1. No acute abnormality. 2. Old right frontal lobe infarct. 3. Mild to moderate diffuse cerebral and cerebellar atrophy. 4. Moderate chronic small vessel white matter ischemic changes in both cerebral hemispheres. 5. Improved sinusitis. Electronically Signed   By: Claudie Revering M.D.   On: 11/25/2017  17:16    EKG: Independently reviewed.  EKG sinus rhythm insignificant Q waves 1 aVL V6.  Normal intervals  Assessment/Plan Principal Problem:   Chest pain Active Problems:   Tobacco abuse   Emphysema, unspecified (HCC)   CAD (coronary artery disease), native coronary artery   Essential hypertension   Hyperlipidemia    Chest pain with shortness of breath -related to activity.  ?syncope. Troponin EKG unremarkable.  Two-view chest x-ray interstitial opacities query ILD/fibrosis.  -Trops X3 - EKG a.m -Echo cardiogram -Stat CTA chest- No PE, left lower lobe peribronchial mass abutting the posterior aspect of the left lower lobe bronchus and encasing segmental branches of the pulmonary arteries. Ephysema/ILD.  Lung mass - left lower lobe peribronchial mass abutting the posterior aspect of the left lower lobe bronchus and encasing segmental branches of the pulmonary arteries. Ephysema/ILD. -Will transfer to [facility] for Oncology and likely pulmonology evaluation, ??biopsy may be obtained through bronchoscopy.  Pulmonology not available at [place], pending eval  Hypotension-systolic 16/10 on admission improved with 1 L IV fluids.  Likely from poor p.o. intake over the past 3 days.  Creatinine at baseline 1.04. Negative influenza -Hydrate n/s + 20 KCL 100cc/hr X  -F/u  blood cultures drawn in ED - CK - 42.  UTI-reports dysuria.  WBC- 27 (but recent prior elevations to 16) with increased absolute neutrophil count.. Normal lactic acid. -IV ceftriaxone 1 g daily started in ED -Urine cultures - CBC a.m   DVT prophylaxis: Scds  for now, pending eval Code Status: Full Family Communication: none at bedside Disposition Plan: per rounding team Consults called: Please Oncology consult in a.m, and likely pulmonology Admission status: Obs, tele   Bethena Roys MD [facility] Pager [phone] From 3PM-11PM.  Otherwise please contact night-coverage [email or web address] Password [email or web address]  11/25/2017, 9:25 PM

## 2017-11-25 NOTE — ED Triage Notes (Addendum)
EMS reports pt c/o chest pain since Sunday, worse today.  Also reports feels like his "memory is off."  Pt reports feeling disoriented today.  Stroke screen negative per ems.  Pt took 4baby asa pta and ems gave 1 nitro.  Reports helped "a little."  REports feels lightheaded and sob

## 2017-11-26 ENCOUNTER — Encounter (HOSPITAL_COMMUNITY): Payer: Self-pay | Admitting: General Practice

## 2017-11-26 ENCOUNTER — Observation Stay (HOSPITAL_BASED_OUTPATIENT_CLINIC_OR_DEPARTMENT_OTHER): Payer: Medicare Other

## 2017-11-26 DIAGNOSIS — I252 Old myocardial infarction: Secondary | ICD-10-CM | POA: Diagnosis not present

## 2017-11-26 DIAGNOSIS — I2510 Atherosclerotic heart disease of native coronary artery without angina pectoris: Secondary | ICD-10-CM | POA: Diagnosis not present

## 2017-11-26 DIAGNOSIS — N390 Urinary tract infection, site not specified: Secondary | ICD-10-CM | POA: Diagnosis present

## 2017-11-26 DIAGNOSIS — E7800 Pure hypercholesterolemia, unspecified: Secondary | ICD-10-CM | POA: Diagnosis not present

## 2017-11-26 DIAGNOSIS — R0789 Other chest pain: Secondary | ICD-10-CM | POA: Diagnosis not present

## 2017-11-26 DIAGNOSIS — J432 Centrilobular emphysema: Secondary | ICD-10-CM | POA: Diagnosis not present

## 2017-11-26 DIAGNOSIS — R55 Syncope and collapse: Secondary | ICD-10-CM | POA: Diagnosis present

## 2017-11-26 DIAGNOSIS — R918 Other nonspecific abnormal finding of lung field: Secondary | ICD-10-CM | POA: Diagnosis not present

## 2017-11-26 DIAGNOSIS — K219 Gastro-esophageal reflux disease without esophagitis: Secondary | ICD-10-CM | POA: Diagnosis not present

## 2017-11-26 DIAGNOSIS — Z9842 Cataract extraction status, left eye: Secondary | ICD-10-CM | POA: Diagnosis not present

## 2017-11-26 DIAGNOSIS — Z96652 Presence of left artificial knee joint: Secondary | ICD-10-CM | POA: Diagnosis not present

## 2017-11-26 DIAGNOSIS — I700 Atherosclerosis of aorta: Secondary | ICD-10-CM | POA: Diagnosis not present

## 2017-11-26 DIAGNOSIS — E785 Hyperlipidemia, unspecified: Secondary | ICD-10-CM | POA: Diagnosis not present

## 2017-11-26 DIAGNOSIS — I5030 Unspecified diastolic (congestive) heart failure: Secondary | ICD-10-CM

## 2017-11-26 DIAGNOSIS — B9620 Unspecified Escherichia coli [E. coli] as the cause of diseases classified elsewhere: Secondary | ICD-10-CM | POA: Diagnosis not present

## 2017-11-26 DIAGNOSIS — J982 Interstitial emphysema: Secondary | ICD-10-CM | POA: Diagnosis not present

## 2017-11-26 DIAGNOSIS — I10 Essential (primary) hypertension: Secondary | ICD-10-CM | POA: Diagnosis not present

## 2017-11-26 DIAGNOSIS — R079 Chest pain, unspecified: Secondary | ICD-10-CM

## 2017-11-26 DIAGNOSIS — Z886 Allergy status to analgesic agent status: Secondary | ICD-10-CM | POA: Diagnosis not present

## 2017-11-26 DIAGNOSIS — Z79899 Other long term (current) drug therapy: Secondary | ICD-10-CM | POA: Diagnosis not present

## 2017-11-26 DIAGNOSIS — Z79891 Long term (current) use of opiate analgesic: Secondary | ICD-10-CM | POA: Diagnosis not present

## 2017-11-26 DIAGNOSIS — F17210 Nicotine dependence, cigarettes, uncomplicated: Secondary | ICD-10-CM | POA: Diagnosis not present

## 2017-11-26 DIAGNOSIS — I951 Orthostatic hypotension: Secondary | ICD-10-CM | POA: Diagnosis present

## 2017-11-26 DIAGNOSIS — Z96641 Presence of right artificial hip joint: Secondary | ICD-10-CM | POA: Diagnosis not present

## 2017-11-26 DIAGNOSIS — Z8673 Personal history of transient ischemic attack (TIA), and cerebral infarction without residual deficits: Secondary | ICD-10-CM | POA: Diagnosis not present

## 2017-11-26 DIAGNOSIS — G8929 Other chronic pain: Secondary | ICD-10-CM | POA: Diagnosis not present

## 2017-11-26 DIAGNOSIS — N3000 Acute cystitis without hematuria: Secondary | ICD-10-CM | POA: Diagnosis not present

## 2017-11-26 DIAGNOSIS — I959 Hypotension, unspecified: Secondary | ICD-10-CM | POA: Diagnosis not present

## 2017-11-26 DIAGNOSIS — J329 Chronic sinusitis, unspecified: Secondary | ICD-10-CM | POA: Diagnosis not present

## 2017-11-26 DIAGNOSIS — Z9841 Cataract extraction status, right eye: Secondary | ICD-10-CM | POA: Diagnosis not present

## 2017-11-26 LAB — BASIC METABOLIC PANEL
Anion gap: 8 (ref 5–15)
BUN: 14 mg/dL (ref 8–23)
CHLORIDE: 105 mmol/L (ref 98–111)
CO2: 26 mmol/L (ref 22–32)
CREATININE: 0.64 mg/dL (ref 0.61–1.24)
Calcium: 8.7 mg/dL — ABNORMAL LOW (ref 8.9–10.3)
GFR calc non Af Amer: >60 mL/min (ref >60)
Glucose, Bld: 76 mg/dL (ref 70–99)
POTASSIUM: 4.1 mmol/L (ref 3.5–5.1)
Sodium: 139 mmol/L (ref 135–145)

## 2017-11-26 LAB — CBC
HCT: 42.8 % (ref 39.0–52.0)
HEMOGLOBIN: 13.5 g/dL (ref 13.0–17.0)
MCH: 29.2 pg (ref 26.0–34.0)
MCHC: 31.5 g/dL (ref 30.0–36.0)
MCV: 92.4 fL (ref 80.0–100.0)
PLATELETS: 381 10*3/uL (ref 150–400)
RBC: 4.63 MIL/uL (ref 4.22–5.81)
RDW: 16.9 % — ABNORMAL HIGH (ref 11.5–15.5)
WBC: 17.4 10*3/uL — ABNORMAL HIGH (ref 4.0–10.5)
nRBC: 0 % (ref 0.0–0.2)

## 2017-11-26 LAB — TROPONIN I
Troponin I: <0.03 ng/mL (ref <0.03)
Troponin I: <0.03 ng/mL (ref <0.03)

## 2017-11-26 LAB — MRSA PCR SCREENING: MRSA by PCR: POSITIVE — AB

## 2017-11-26 LAB — ECHOCARDIOGRAM COMPLETE
Height: 72 in
WEIGHTICAEL: 2400 [oz_av]

## 2017-11-26 MED ORDER — METOPROLOL SUCCINATE ER 25 MG PO TB24
25.0000 mg | ORAL_TABLET | Freq: Every day | ORAL | Status: DC
  Administered 2017-11-26 – 2017-11-27 (×2): 25 mg via ORAL
  Filled 2017-11-26 (×2): qty 1

## 2017-11-26 MED ORDER — ALBUTEROL SULFATE (2.5 MG/3ML) 0.083% IN NEBU: 2.5000 mg | INHALATION_SOLUTION | RESPIRATORY_TRACT | Status: DC | PRN

## 2017-11-26 MED ORDER — AMLODIPINE BESYLATE 5 MG PO TABS
5.0000 mg | ORAL_TABLET | Freq: Every day | ORAL | Status: DC
  Administered 2017-11-26 – 2017-11-27 (×2): 5 mg via ORAL
  Filled 2017-11-26 (×2): qty 1

## 2017-11-26 MED ORDER — CHLORHEXIDINE GLUCONATE CLOTH 2 % EX PADS
6.0000 | MEDICATED_PAD | Freq: Every day | CUTANEOUS | Status: DC
  Administered 2017-11-27 – 2017-11-28 (×2): 6 via TOPICAL

## 2017-11-26 MED ORDER — MUPIROCIN 2 % EX OINT
1.0000 "application " | TOPICAL_OINTMENT | Freq: Two times a day (BID) | CUTANEOUS | Status: DC
  Administered 2017-11-26 – 2017-11-27 (×3): 1 via NASAL
  Filled 2017-11-26 (×3): qty 22

## 2017-11-26 MED ORDER — PANTOPRAZOLE SODIUM 40 MG PO TBEC
40.0000 mg | DELAYED_RELEASE_TABLET | Freq: Every day | ORAL | Status: DC
  Administered 2017-11-26 – 2017-11-27 (×2): 40 mg via ORAL
  Filled 2017-11-26 (×2): qty 1

## 2017-11-26 MED ORDER — SIMVASTATIN 20 MG PO TABS
10.0000 mg | ORAL_TABLET | Freq: Every day | ORAL | Status: DC
  Administered 2017-11-26 – 2017-11-27 (×2): 10 mg via ORAL
  Filled 2017-11-26 (×2): qty 1

## 2017-11-26 MED ORDER — GUAIFENESIN 100 MG/5ML PO SOLN
5.0000 mL | ORAL | Status: DC | PRN
  Administered 2017-11-26: 100 mg via ORAL
  Filled 2017-11-26: qty 5

## 2017-11-26 MED ORDER — LISINOPRIL 10 MG PO TABS
10.0000 mg | ORAL_TABLET | Freq: Every day | ORAL | Status: DC
  Administered 2017-11-26 – 2017-11-27 (×2): 10 mg via ORAL
  Filled 2017-11-26 (×2): qty 1

## 2017-11-26 NOTE — ED Notes (Signed)
Report given to Rod Holler RN [place]

## 2017-11-26 NOTE — ED Notes (Signed)
Sister, Jeanie Sewer contacted concerning transfer, left message.

## 2017-11-26 NOTE — Progress Notes (Signed)
*  PRELIMINARY RESULTS* Echocardiogram 2D Echocardiogram has been performed.  Bradley [name] 11/26/2017, 2:59 PM

## 2017-11-26 NOTE — ED Notes (Signed)
Spoke with [facility] will send a truck to transport pt as soon as one is availible.

## 2017-11-26 NOTE — Progress Notes (Signed)
Waiting on med from pharmacy

## 2017-11-26 NOTE — Progress Notes (Addendum)
Transfer acceptance note  Patient was transferred from [facility] for evaluation of suspected lung mass seen on CT chest by Pulmonology in the context of chest pain.  There was no pulmonologist available at [facility] and hence transferred to [facility].  He was seen by [facility] MD at [facility] prior to transfer.  I briefly interviewed and examined patient.  He reports that he feels much better, central chest discomfort is down to 5-6/10 from 8/10 and mostly worse with deep breathing or coughing.  He reports that on Sunday PTA he felt like he was coming down with a "cold".  He was sitting at the edge of the bed and rolling cigarettes (he makes his own cigarettes), reportedly felt lightheaded and fell back in the bed.  He states that he then does not remember anything or waking up until Wednesday morning.  Appears in no distress.  Vital signs stable. Respiratory system: Slightly harsh breath sounds but no wheezing, rhonchi or crackles.  No increased work of breathing. Cardiovascular system: S1 and S2 heard, RRR.  No JVD, murmurs or pedal edema. CNS: Alert and oriented.  No focal neurological deficits.  TTE: LVEF 55-60% and grade 1 diastolic dysfunction.  CT chest results appreciated.  Assessment and plan:  Left lower lobe lung mass: Noted on CT chest at [facility].  Pulmonology consultation appreciated and I discussed with Dr. Elsworth Soho.  He indicates that the CT is not convincing for an endobronchial lesion.  However this will need further evaluation as outpatient including PET scan, PFTs and based on these to decide further evaluation including bronchoscopy versus EBUS  Severe emphysema: Tobacco cessation counseled.  No clinical bronchospasm.  Syncope: Unclear etiology.  May be related to hypotension on admission.  TTE without acute findings.  Telemetry with sinus rhythm.  Check orthostatics.  Counseled patient that he should not drive for 6 months and he verbalized understanding.  Atypical chest pain: Unclear etiology.  Denies history of  trauma.  Less likely that the lung mass is contributing to his chest pain.  Ruled out for MI or PE.?  Related to acute bronchitis versus musculoskeletal.  Seems to be improving.  Supportive treatment.  Monitor closely.  Vernell Leep, MD, FACP, [facility] Pager [phone]  If 7PM-7AM, please contact night-coverage [email or web address] Password [facility] 11/26/2017, 6:17 PM

## 2017-11-26 NOTE — Progress Notes (Signed)
PROGRESS NOTE    Bradley [name]  PJK:[id number] DOB: 08/21/1940 DOA: 11/25/2017 PCP: Jani Gravel, MD   Brief Narrative:  Per HPI from Dr. Denton Brick on 10/30: Bradley [name] is a 77 y.o. male with medical history significant for CAD, HTN, alcohol abuse who presented to the ED with complaints of chest pain x 3 days, with associated lightheadedness and shortness of breath.  Chest pain is unrelated to activity or meals.  Patient reports 3 days ago on Sunday he was lying in bed sitting up getting ready to go to church, passed out and he woke up today.  He says he really has no recollection of what has happened in the past 3 days, so he has not eaten or drank in the past 3 days but he says he has had chest pain for 3 days.  Patient smokes 1 pack/day.  Reports also dysuria over the past few days, and chronic increased urination.  His daughter reported to ED provider that she is concerned about patient's memory.  Patient has been admitted with chest pain secondary to lung mass that will need further evaluation at [facility] due to had no pulmonology coverage at this facility.  Additionally, patient is noted to have UTI with urine cultures pending and has been started on Rocephin.   Assessment & Plan:   Principal Problem:   Chest pain Active Problems:   Tobacco abuse   Emphysema, unspecified (HCC)   CAD (coronary artery disease), native coronary artery   Essential hypertension   Hyperlipidemia   1. Chest pain likely secondary to left lower lobe peribronchial mass.  Troponins have been within normal limits with no significant changes on EKG noted.  Echocardiogram is pending.  Patient is pending transfer to [facility] for further evaluation via bronchoscopy with pulmonology.  He does have extensive history of tobacco abuse noted since the age of 36.  Continue n.p.o. after midnight pending evaluation which will likely not be done today.  Will start on cardiac prudent diet and hold further IV  fluid. 2. Hypotension- resolved.  Likely secondary to poor oral intake over the past 3 days.  Blood pressures have improved with IV fluid administration and he does not appear to need this any further.  Resume home blood pressure medications this morning. 3. Symptomatic UTI.  Leukocytosis is improving and patient has been started on Rocephin empirically.  Urine cultures and blood cultures pending. 4. Tobacco abuse.  Offered nicotine patch and patient declined. 5. Dyslipidemia.  Continue statin. 6. GERD.  PPI.   DVT prophylaxis: SCDs Code Status: Full Family Communication: None at bedside Disposition Plan: Plan to transfer to [facility] for further evaluation via bronchoscopy by pulmonology.  Please consult pulmonology on arrival.  Continue Rocephin for UTI treatment.   Consultants:   None  Procedures:   None  Antimicrobials:   Rocephin 10/30->   Subjective: Patient seen and evaluated today with no new acute complaints or concerns. No acute concerns or events noted overnight.  He denies any further chest pain or dysuria this morning.  Objective: Vitals:   11/26/17 0651 11/26/17 0700 11/26/17 0715 11/26/17 0800  BP: 127/77 124/75  138/76  Pulse: 72 70 69 68  Resp: (!) 22 15 17    Temp:      TempSrc:      SpO2: 94% 97% 97% 98%  Weight:      Height:        Intake/Output Summary (Last 24 hours) at 11/26/2017 1008 Last data filed at 11/25/2017  2236 Gross per 24 hour  Intake 1583.81 ml  Output -  Net 1583.81 ml   Filed Weights   11/25/17 1514  Weight: 68 kg    Examination:  General exam: Appears calm and comfortable  Respiratory system: Clear to auscultation. Respiratory effort normal. Cardiovascular system: S1 & S2 heard, RRR. No JVD, murmurs, rubs, gallops or clicks. No pedal edema. Gastrointestinal system: Abdomen is nondistended, soft and nontender. No organomegaly or masses felt. Normal bowel sounds heard. Central nervous system: Alert and oriented. No focal  neurological deficits. Extremities: Symmetric 5 x 5 power. Skin: No rashes, lesions or ulcers Psychiatry: Judgement and insight appear normal. Mood & affect appropriate.     Data Reviewed: I have personally reviewed following labs and imaging studies  CBC: Recent Labs  Lab 11/25/17 1530 11/26/17 0220  WBC 27.5* 17.4*  NEUTROABS 22.6*  --   HGB 14.2 13.5  HCT 44.1 42.8  MCV 94.4 92.4  PLT 412* 737   Basic Metabolic Panel: Recent Labs  Lab 11/25/17 1530 11/26/17 0849  NA 138 139  K 3.8 4.1  CL 105 105  CO2 24 26  GLUCOSE 118* 76  BUN 24* 14  CREATININE 1.04 0.64  CALCIUM 8.7* 8.7*   GFR: Estimated Creatinine Clearance: 74.4 mL/min (by C-G formula based on SCr of 0.64 mg/dL). Liver Function Tests: No results for input(s): AST, ALT, ALKPHOS, BILITOT, PROT, ALBUMIN in the last 168 hours. No results for input(s): LIPASE, AMYLASE in the last 168 hours. No results for input(s): AMMONIA in the last 168 hours. Coagulation Profile: No results for input(s): INR, PROTIME in the last 168 hours. Cardiac Enzymes: Recent Labs  Lab 11/25/17 2020 11/26/17 0220 11/26/17 0749  CKTOTAL 42*  --   --   TROPONINI <0.03 <0.03 <0.03   BNP (last 3 results) No results for input(s): PROBNP in the last 8760 hours. HbA1C: No results for input(s): HGBA1C in the last 72 hours. CBG: No results for input(s): GLUCAP in the last 168 hours. Lipid Profile: No results for input(s): CHOL, HDL, LDLCALC, TRIG, CHOLHDL, LDLDIRECT in the last 72 hours. Thyroid Function Tests: No results for input(s): TSH, T4TOTAL, FREET4, T3FREE, THYROIDAB in the last 72 hours. Anemia Panel: No results for input(s): VITAMINB12, FOLATE, FERRITIN, TIBC, IRON, RETICCTPCT in the last 72 hours. Sepsis Labs: Recent Labs  Lab 11/25/17 1703  LATICACIDVEN 0.96    Recent Results (from the past 240 hour(s))  Culture, blood (routine x 2)     Status: None (Preliminary result)   Collection Time: 11/25/17  4:55 PM    Result Value Ref Range Status   Specimen Description RIGHT ANTECUBITAL  Final   Special Requests   Final    BOTTLES DRAWN AEROBIC AND ANAEROBIC Blood Culture results may not be optimal due to an excessive volume of blood received in culture bottles   Culture   Final    NO GROWTH < 24 HOURS Performed at [facility], [address] [place], [place] [address]    Report Status PENDING  Incomplete  Culture, blood (routine x 2)     Status: None (Preliminary result)   Collection Time: 11/25/17  8:15 PM  Result Value Ref Range Status   Specimen Description BLOOD RIGHT HAND  Final   Special Requests   Final    BOTTLES DRAWN AEROBIC AND ANAEROBIC Blood Culture adequate volume   Culture   Final    NO GROWTH < 12 HOURS Performed at [facility], [address] [place], [place]  [address]    Report Status PENDING  Incomplete         Radiology Studies: Dg Chest 2 View  Result Date: 11/25/2017 CLINICAL DATA:  Chest pain EXAM: CHEST - 2 VIEW COMPARISON:  08/08/2017 FINDINGS: Chronic interstitial opacities throughout the lungs, likely chronic lung disease/fibrosis. Heart is normal size. No effusions or acute bony abnormality. 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 Electronically Signed   By: Rolm Baptise M.D.   On: 11/25/2017 15:58   Ct Head Wo Contrast  Result Date: 11/25/2017 CLINICAL DATA:  Chest pain for the past 3 days. Altered mental status. Recurrent syncope. EXAM: CT HEAD WITHOUT CONTRAST TECHNIQUE: Contiguous axial images were obtained from the base of the skull through the vertex without intravenous contrast. COMPARISON:  Maxillofacial CT dated 03/15/2016. FINDINGS: Brain: Old right frontal lobe infarct. Mild-to-moderate diffuse enlargement of the ventricles and subarachnoid spaces. Moderate patchy white matter low density in both cerebral hemispheres. No intracranial hemorrhage, mass lesion or CT evidence of  acute infarction. Vascular: No hyperdense vessel or unexpected calcification. Skull: Normal. Negative for fracture or focal lesion. Sinuses/Orbits: Status post bilateral cataract extraction. Mucosal thickening and fluid in the right maxillary sinus with mild improvement. Resolved opacification of the right sphenoid sinus. Significantly improved right ethmoid sinus mucosal thickening. Other: None. IMPRESSION: 1. No acute abnormality. 2. Old right frontal lobe infarct. 3. Mild to moderate diffuse cerebral and cerebellar atrophy. 4. Moderate chronic small vessel white matter ischemic changes in both cerebral hemispheres. 5. Improved sinusitis. Electronically Signed   By: Claudie Revering M.D.   On: 11/25/2017 17:16   Ct Angio Chest Pe W Or Wo Contrast  Result Date: 11/25/2017 CLINICAL DATA:  Chest pain EXAM: CT ANGIOGRAPHY CHEST WITH CONTRAST TECHNIQUE: Multidetector CT imaging of the chest was performed using the standard protocol during bolus administration of intravenous contrast. Multiplanar CT image reconstructions and MIPs were obtained to evaluate the vascular anatomy. CONTRAST:  132mL ISOVUE-370 IOPAMIDOL (ISOVUE-370) INJECTION 76% COMPARISON:  Chest radiograph 11/25/2017 FINDINGS: Cardiovascular: --Pulmonary arteries: Contrast injection is sufficient to demonstrate satisfactory opacification of the pulmonary arteries to the segmental level. There is no pulmonary embolus. The main pulmonary artery is within normal limits for size. --Aorta: Limited opacification of the aorta due to bolus timing optimization for the pulmonary arteries. Conventional 3 vessel aortic branching pattern. The aortic course and caliber are normal. There is mild aortic atherosclerosis. --Heart: Normal size. No pericardial effusion. Mediastinum/Nodes: No mediastinal, hilar or axillary lymphadenopathy. The visualized thyroid and thoracic esophageal course are unremarkable. Lungs/Pleura: Advanced emphysema. No pleural effusion or  pneumothorax. There is central peribronchovascular thickening. There is a mass along the posterior aspect of the left lower lobe bronchus encasing multiple pulmonary artery branches and measuring approximately 3.8 x 2.9 cm. This is best demonstrated on sagittal image 104 (series 8). There may be an intrabronchial component within the LLL bronchus. Upper Abdomen: Contrast bolus timing is not optimized for evaluation of the abdominal organs. Within this limitation, the visualized organs of the upper abdomen are normal. Musculoskeletal: Old right tenth and eleventh rib fractures. Review of the MIP images confirms the above findings. IMPRESSION: 1. No pulmonary embolus. 2. Left lower lobe peribronchial mass, abutting the posterior aspect of the left lower lobe bronchus and encasing segmental branches of the pulmonary arteries. 3. Extensive emphysema and/or interstitial lung disease. Aortic Atherosclerosis (ICD10-I70.0) and Emphysema (ICD10-J43.9). Electronically Signed   By: Ulyses Jarred M.D.   On: 11/25/2017 20:41  Scheduled Meds: . mometasone-formoterol  2 puff Inhalation BID   Continuous Infusions: . 0.9 % NaCl with KCl 20 mEq / L 100 mL/hr at 11/25/17 2236  . cefTRIAXone (ROCEPHIN)  IV       LOS: 0 days    Time spent: 30 minutes    Homer Miller Darleen Crocker, DO [facility] Pager [phone]  If 7PM-7AM, please contact night-coverage [email or web address] Password [email or web address] 11/26/2017, 10:08 AM

## 2017-11-26 NOTE — Care Management Obs Status (Signed)
[place] NOTIFICATION   Patient Details  Name: Bradley [name] MRN: [id number] Date of Birth: 1940/03/08   Medicare Observation Status Notification Given:  Yes    Midge Minium RN, BSN, NCM-BC, ACM-RN [phone] 11/26/2017, 4:33 PM

## 2017-11-26 NOTE — ED Notes (Signed)
Pt states he was using urinal in bed and "accidentally had an accident in my briefs." RN asked Pt if this happens often and Pt states "yes". "that's why I sit down to pee at home, I never know when Im goin to have a BM." RN provided Pt incontinent brief and hospital scrub bottoms after providing Peri Care and changing Pt bed linens.

## 2017-11-26 NOTE — Consult Note (Signed)
Name: Bradley [name] MRN: [id number] DOB: 1940/02/01    ADMISSION DATE:  11/25/2017 CONSULTATION DATE:  11/26/2017   REFERRING MD :  Sheliah Plane, hongalgi   HISTORY OF PRESENT ILLNESS: 77 year old smoker who was admitted to any pain hospital on 10/30 for chest pain.  CT angiogram showed severe emphysema and left lower lobe mass encasing pulmonary arteries measuring 4 cm x 2.0 cm.  Hence the consult.  He presented with 3 days of substernal dull chest pain, nonradiating, not related to exertion or breathing an episode of near passing out.  Found to be mildly hypotensive in the ED with leukocytosis.  EKG did not show any evidence of ischemia, troponin was negative.  He reported 8 out of 10 chest pain which is now decreased to 6 out of 10. Blood cultures are negative so far and WBC count has decreased, rapid flu screen was negative  Review of systems-he reports dyspnea on walking short distances but unable to quantify, he denies wheezing.  He gets a chest cold about once a year.  He denies weight loss or fevers.  He does admit to dysuria.  He smoked up to 4 packs/day starting at a young age and now has cut down to 1 pack/day.  He rolls his own cigarettes.  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  has a past surgical history that includes Appendectomy; Tonsillectomy; Tooth Extraction; Nasal endoscopy with epistaxis control (Right, 03/15/2016); Sinus exploration (Right, 03/15/2016); Septoplasty (Right, 03/15/2016); Joint replacement; Total knee arthroplasty (Left); Cataract extraction, bilateral (Bilateral); Cardiac catheterization; LEFT HEART CATH AND CORONARY ANGIOGRAPHY  (N/A, 09/12/2016); and Hip Arthroplasty (Right, 12/20/2016). Prior to Admission medications   Medication Sig Start Date End Date Taking? Authorizing Provider  ADVAIR HFA 115-21 MCG/ACT inhaler Inhale 2 puffs into the lungs every 12 (twelve) hours. 01/29/16  Yes [provider]  amLODipine (NORVASC) 5 MG tablet TAKE 1 TABLET BY MOUTH EVERY DAY Patient taking differently: Take 5 mg by mouth daily.  08/24/17  Yes Lendon Colonel, NP  Ascorbic Acid (VITAMIN C) 500 MG CAPS Take 500 mg by mouth daily.   Yes [provider]  aspirin EC 81 MG tablet Take 1 tablet by mouth daily.   Yes [provider]  celecoxib (CELEBREX) 200 MG capsule Take 1 capsule (200 mg total) by mouth daily. 12/22/16  Yes [name], MD  cyclobenzaprine (FLEXERIL) 5 MG tablet Take 5 mg by mouth 2 (two) times daily.    Yes [provider]  Ferrous Sulfate (IRON) 325 (65 Fe) MG TABS Take 1 tablet by mouth daily. 12/10/15  Yes [provider]  gabapentin (NEURONTIN) 300 MG capsule Take 1 capsule by mouth 3 (three) times daily. 08/15/16  Yes [provider]  lisinopril (PRINIVIL,ZESTRIL) 10 MG tablet TAKE 1 TABLET BY MOUTH EVERY DAY Patient taking differently: Take 10 mg by mouth daily.  08/24/17  Yes Lendon Colonel, NP  metoCLOPramide (REGLAN) 5 MG tablet Take 1 tablet by mouth 4 (four) times daily -  before meals and at bedtime. 01/25/16  Yes [provider]  metoprolol succinate (TOPROL-XL) 25 MG 24 hr tablet Take 25 mg by mouth daily.   Yes [provider]  mirabegron  ER (MYRBETRIQ) 25 MG TB24 tablet Take 25 mg by mouth daily.   Yes [provider]  nitroGLYCERIN (NITROSTAT) 0.4 MG SL tablet Place 0.4 mg under the tongue every 5 (five) minutes as needed for chest pain.   Yes [provider]  omeprazole (PRILOSEC) 40 MG capsule Take 40 mg by mouth daily.    Yes [provider]  Oxycodone HCl 10 MG TABS Take 10 mg by mouth 4  (four) times daily.    Yes [provider]  potassium gluconate (HM POTASSIUM) 595 (99 K) MG TABS tablet Take 595 mg by mouth daily.   Yes [provider]  simvastatin (ZOCOR) 10 MG tablet Take 10 mg by mouth daily. 12/23/15  Yes [provider]  VENTOLIN HFA 108 (90 Base) MCG/ACT inhaler Inhale 2 puffs into the lungs every 4 (four) hours as needed for wheezing or shortness of breath.  12/11/15  Yes [provider]  KLOR-CON M20 20 MEQ tablet TAKE 1 TABLET BY MOUTH EVERY DAY Patient not taking: Reported on 11/25/2017 04/20/17   Lendon Colonel, NP   Allergies  Allergen Reactions  . Aspirin     Regular asa     FAMILY HISTORY:  family history includes Cancer in his mother; Obesity in his father. He was adopted. SOCIAL HISTORY:  reports that he has been smoking cigarettes. He has a 68.00 pack-year smoking history. He has never used smokeless tobacco. He reports that he does not drink alcohol or use drugs.  REVIEW OF SYSTEMS:   Constitutional: Negative for fever, chills, weight loss, malaise/fatigue and diaphoresis.  HENT: Negative for hearing loss, ear pain, nosebleeds, congestion, sore throat, neck pain, tinnitus and ear discharge.   Eyes: Negative for blurred vision, double vision, photophobia, pain, discharge and redness.  Respiratory: Negative for  hemoptysis, wheezing and stridor.   Cardiovascular: Negative for  palpitations, orthopnea, claudication, leg swelling and PND.  Gastrointestinal: Negative for heartburn, nausea, vomiting, abdominal pain, diarrhea, constipation, blood in stool and melena.  Genitourinary: Negative for dysuria, urgency, frequency, hematuria and flank pain.  Musculoskeletal: Negative for myalgias, back pain, joint pain and falls.  Skin: Negative for itching and rash.  Neurological: Negative for dizziness, tingling, tremors, sensory change, speech change, focal weakness, seizures, loss of consciousness, weakness and  headaches.  Endo/Heme/Allergies: Negative for environmental allergies and polydipsia. Does not bruise/bleed easily.  SUBJECTIVE:   VITAL SIGNS: Pulse Rate:  [68-80] 72 (10/31 1646) Resp:  [14-24] 18 (10/31 1646) BP: (109-169)/(66-135) 153/73 (10/31 1646) SpO2:  [92 %-100 %] 96 % (10/31 1646)  PHYSICAL EXAMINATION: General: Thin elderly man sitting up in no visible distress Neuro: Alert awake, weakness left arm which is chronic HEENT: No pallor, icterus, no JVD Cardiovascular: S1-S2 normal no murmur Lungs: Decreased breath sounds bilateral, no accessory muscle use Abdomen: Soft and nontender Musculoskeletal: No deformity Skin: No rash  Recent Labs  Lab 11/25/17 1530 11/26/17 0849  NA 138 139  K 3.8 4.1  CL 105 105  CO2 24 26  BUN 24* 14  CREATININE 1.04 0.64  GLUCOSE 118* 76   Recent Labs  Lab 11/25/17 1530 11/26/17 0220  HGB 14.2 13.5  HCT 44.1 42.8  WBC 27.5* 17.4*  PLT 412* 381   Dg Chest 2 View  Result Date: 11/25/2017 CLINICAL DATA:  Chest pain EXAM: CHEST - 2 VIEW COMPARISON:  08/08/2017 FINDINGS: Chronic interstitial opacities throughout the lungs, likely chronic lung disease/fibrosis. Heart is normal size. No effusions or acute bony abnormality. 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 Electronically Signed   By: Rolm Baptise M.D.   On: 11/25/2017 15:58   Ct Head Wo Contrast  Result Date: 11/25/2017 CLINICAL DATA:  Chest pain for the past 3 days. Altered mental status. Recurrent syncope. EXAM: CT HEAD WITHOUT CONTRAST TECHNIQUE: Contiguous axial images were obtained from the base of the skull through the vertex without intravenous contrast. COMPARISON:  Maxillofacial CT dated 03/15/2016. FINDINGS: Brain: Old right frontal lobe infarct. Mild-to-moderate diffuse enlargement of the ventricles and subarachnoid spaces. Moderate patchy white matter low density in both cerebral hemispheres. No  intracranial hemorrhage, mass lesion or CT evidence of acute infarction. Vascular: No hyperdense vessel or unexpected calcification. Skull: Normal. Negative for fracture or focal lesion. Sinuses/Orbits: Status post bilateral cataract extraction. Mucosal thickening and fluid in the right maxillary sinus with mild improvement. Resolved opacification of the right sphenoid sinus. Significantly improved right ethmoid sinus mucosal thickening. Other: None. IMPRESSION: 1. No acute abnormality. 2. Old right frontal lobe infarct. 3. Mild to moderate diffuse cerebral and cerebellar atrophy. 4. Moderate chronic small vessel white matter ischemic changes in both cerebral hemispheres. 5. Improved sinusitis. Electronically Signed   By: Claudie Revering M.D.   On: 11/25/2017 17:16   Ct Angio Chest Pe W Or Wo Contrast  Result Date: 11/25/2017 CLINICAL DATA:  Chest pain EXAM: CT ANGIOGRAPHY CHEST WITH CONTRAST TECHNIQUE: Multidetector CT imaging of the chest was performed using the standard protocol during bolus administration of intravenous contrast. Multiplanar CT image reconstructions and MIPs were obtained to evaluate the vascular anatomy. CONTRAST:  171mL ISOVUE-370 IOPAMIDOL (ISOVUE-370) INJECTION 76% COMPARISON:  Chest radiograph 11/25/2017 FINDINGS: Cardiovascular: --Pulmonary arteries: Contrast injection is sufficient to demonstrate satisfactory opacification of the pulmonary arteries to the segmental level. There is no pulmonary embolus. The main pulmonary artery is within normal limits for size. --Aorta: Limited opacification of the aorta due to bolus timing optimization for the pulmonary arteries. Conventional 3 vessel aortic branching pattern. The aortic course and caliber are normal. There is mild aortic atherosclerosis. --Heart: Normal size. No pericardial effusion. Mediastinum/Nodes: No mediastinal, hilar or axillary lymphadenopathy. The visualized thyroid and thoracic esophageal course are unremarkable.  Lungs/Pleura: Advanced emphysema. No pleural effusion or pneumothorax. There is central peribronchovascular thickening. There is a mass along the posterior aspect of the left lower lobe bronchus encasing multiple pulmonary artery branches and measuring approximately 3.8 x 2.9 cm. This is best demonstrated on sagittal image 104 (series 8). There may be an intrabronchial component within the LLL bronchus. Upper Abdomen: Contrast bolus timing is not optimized for evaluation of the abdominal organs. Within this limitation, the visualized organs of the upper abdomen are normal. Musculoskeletal: Old right tenth and eleventh rib fractures. Review of the MIP images confirms the above findings. IMPRESSION: 1. No pulmonary embolus. 2. Left lower lobe peribronchial mass, abutting the posterior aspect of the left lower lobe bronchus and encasing segmental branches of the pulmonary arteries. 3. Extensive emphysema and/or interstitial lung disease. Aortic Atherosclerosis (ICD10-I70.0) and Emphysema (ICD10-J43.9). Electronically Signed   By: Ulyses Jarred M.D.   On: 11/25/2017 20:41    ASSESSMENT / PLAN:  Severe emphysema on CT Left lower lobe lung mass, CT not convincing for endobronchial extension.  Likely to be lung malignancy Smoker  Recommend- PET scan as outpatient Schedule PFTs as outpatient. Follow-up with [place] as outpatient after these tests Based on above, we can decide on best route of biopsy, bronchoscopy versus EBUS Smoking cessation emphasized Meanwhile can  start on LAMA such as Spiriva Respimat 2 puffs once daily  PCCM available as needed  Kara Mead MD. FCCP. [facility] Pager [phone] If no response call [phone]     11/26/2017, 5:48 PM

## 2017-11-27 ENCOUNTER — Telehealth: Payer: Self-pay | Admitting: Pulmonary Disease

## 2017-11-27 DIAGNOSIS — J439 Emphysema, unspecified: Secondary | ICD-10-CM

## 2017-11-27 DIAGNOSIS — R0789 Other chest pain: Secondary | ICD-10-CM | POA: Diagnosis not present

## 2017-11-27 DIAGNOSIS — E785 Hyperlipidemia, unspecified: Secondary | ICD-10-CM

## 2017-11-27 DIAGNOSIS — N390 Urinary tract infection, site not specified: Secondary | ICD-10-CM

## 2017-11-27 DIAGNOSIS — R079 Chest pain, unspecified: Secondary | ICD-10-CM

## 2017-11-27 DIAGNOSIS — R911 Solitary pulmonary nodule: Secondary | ICD-10-CM

## 2017-11-27 DIAGNOSIS — I10 Essential (primary) hypertension: Secondary | ICD-10-CM

## 2017-11-27 DIAGNOSIS — R55 Syncope and collapse: Secondary | ICD-10-CM | POA: Diagnosis not present

## 2017-11-27 DIAGNOSIS — Z720 Tobacco use: Secondary | ICD-10-CM

## 2017-11-27 DIAGNOSIS — D72829 Elevated white blood cell count, unspecified: Secondary | ICD-10-CM | POA: Diagnosis not present

## 2017-11-27 LAB — CBC
HCT: 45.8 % (ref 39.0–52.0)
HEMOGLOBIN: 15.2 g/dL (ref 13.0–17.0)
MCH: 29.7 pg (ref 26.0–34.0)
MCHC: 33.2 g/dL (ref 30.0–36.0)
MCV: 89.5 fL (ref 80.0–100.0)
Platelets: 399 10*3/uL (ref 150–400)
RBC: 5.12 MIL/uL (ref 4.22–5.81)
RDW: 15.9 % — AB (ref 11.5–15.5)
WBC: 13.6 10*3/uL — ABNORMAL HIGH (ref 4.0–10.5)
nRBC: 0 % (ref 0.0–0.2)

## 2017-11-27 LAB — BASIC METABOLIC PANEL
Anion gap: 12 (ref 5–15)
BUN: 15 mg/dL (ref 8–23)
CALCIUM: 9.3 mg/dL (ref 8.9–10.3)
CHLORIDE: 101 mmol/L (ref 98–111)
CO2: 22 mmol/L (ref 22–32)
CREATININE: 0.79 mg/dL (ref 0.61–1.24)
GFR calc Af Amer: >60 mL/min (ref >60)
GFR calc non Af Amer: >60 mL/min (ref >60)
Glucose, Bld: 107 mg/dL — ABNORMAL HIGH (ref 70–99)
Potassium: 3.7 mmol/L (ref 3.5–5.1)
SODIUM: 135 mmol/L (ref 135–145)

## 2017-11-27 MED ORDER — LISINOPRIL 20 MG PO TABS: 20.0000 mg | ORAL_TABLET | Freq: Every day | ORAL | Status: DC

## 2017-11-27 MED ORDER — LISINOPRIL 20 MG PO TABS: 20.0000 mg | ORAL_TABLET | Freq: Every day | ORAL | 0 refills | Status: DC

## 2017-11-27 MED ORDER — CEPHALEXIN 500 MG PO CAPS: 500.0000 mg | ORAL_CAPSULE | Freq: Two times a day (BID) | ORAL | 0 refills | Status: AC

## 2017-11-27 NOTE — Consult Note (Addendum)
Cardiology Consultation:   Patient ID: Bradley [name]; [id number]; 1940-10-06   Admit date: 11/25/2017 Date of Consult: 11/27/2017  Primary Care Provider: Jani Gravel, MD Primary Cardiologist: Kate Sable, MD Primary Electrophysiologist: None   Patient Profile:   Bradley [name] is a 77 y.o. 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 Algis Liming.  History of Present Illness:   Bradley [name] reported having a cold over the weekend, which he was managing with OTC supportive care (cough suppressants, antihistamines, and decongestants), and felt generally unwell on Sunday morning, 11/22/17. He reports having a fever that morning and was sitting on the edge of his bed rolling cigarettes when he became lightheaded so he laid back in bed. The next thing he remembers is getting dressed, for what he thought was church on Sunday, but his daughter told him it was Wednesday 11/25/17. His daughter told him that he had been eating his meals over the previous few days and acting normal but the patient has no memory of these events. In addition to this, he reports nearly constant chest pain. He states he has had chest pain for decades now which he describes as an ache which varies in intensity form a 5/10 to an 8/10. He denies changes in his chest pain characteristics and has no associated symptoms. He does not recall any change in chest pain quality prior to his presumed loss of consciousness.     He was last seen by cardiology, Jory Sims NP, 11/2016 in follow-up of recent hospitalization for chest pain and was felt to be doing well from a cardiac standpoint. He underwent a cardiac catheterization 08/2016 which showed 30% stenosis of OM2, otherwise normal coronaries. Echo 11/2016 with EF 60-65%, G1DD, and no wall motion abnormalities.   At the time of this evaluation he  reports feeling back to his usual self. He rates his chest pain as a 5/10 at this time. Understands that he will need further work-up for lung mass seen on imaging this admission. He denies orthopnea, PND, LE edema, palpitations, nausea, vomiting, diarrhea, or abdominal pain.   Hospital course: hypertensive, otherwise VSS. Labs notable for electrolytes wnl, Cr 1.04>0.79, WBC 27.5>13.6, Hgb/PLT wnl, trop negative x3, CK wnl, lactate wnl. UCx + for GNR. CXR with chronic interstitial opacities throughout lungs suggestive of chronic interstitial lung disease/fibrosis. CT Head with old right frontal lobe infarct, mild-mod diffuse atrophy, and moderate chronic small vessel ischemic changes. CT chest without PE but revealed LLL mass encasing segmental branches of the pulmonary arteries. Patient was admitted to medicine. Started on ceftriaxone for UTI. Reportedly hypotensive on arrival to [facility] felt to be 2/2 poor po intake which resolved with IVFs. Patient was transferred to [facility] for pulmonary evaluation and recommended for outpatient work-up. Cardiology consulted for chest pain and ?syncope.   Past Medical History:  Diagnosis Date  . Chronic lower back pain   . Contact with stonefish as cause of accidental injury 1954   "stung across my left hand in [place]; LUE weaker since"  . Coronary artery disease   . High cholesterol   . History of blood transfusion 1943  . Hypertension   . Migraine    "none since the 1990s" (09/11/2016)  . Myocardial infarction ([place]) 01/20/1993; 02/28/1993  . Pneumonia    "several times" (09/11/2016)  . Tobacco abuse     Past  Surgical History:  Procedure Laterality Date  . APPENDECTOMY    . CARDIAC CATHETERIZATION    . CATARACT EXTRACTION, BILATERAL Bilateral   . HIP ARTHROPLASTY Right 12/20/2016   Procedure: ARTHROPLASTY BIPOLAR HIP (HEMIARTHROPLASTY);  Surgeon: [name], MD;  Location: [place];  Service: Orthopedics;  Laterality: Right;  . JOINT REPLACEMENT    . LEFT  HEART CATH AND CORONARY ANGIOGRAPHY N/A 09/12/2016   Procedure: LEFT HEART CATH AND CORONARY ANGIOGRAPHY;  Surgeon: Burnell Blanks, MD;  Location: [place] [facility];  Service: Cardiovascular;  Laterality: N/A;  . NASAL ENDOSCOPY WITH EPISTAXIS CONTROL Right 03/15/2016   Procedure: NASAL ENDOSCOPY WITH EPISTAXIS CONTROL;  Surgeon: Jodi Marble, MD;  Location: [place];  Service: ENT;  Laterality: Right;  . SEPTOPLASTY Right 03/15/2016   Procedure: SEPTOPLASTY;  Surgeon: Jodi Marble, MD;  Location: [place];  Service: ENT;  Laterality: Right;  . SINUS EXPLORATION Right 03/15/2016   Procedure: SINUS EXPLORATION;  Surgeon: Jodi Marble, MD;  Location: [place];  Service: ENT;  Laterality: Right;  . TONSILLECTOMY    . TOOTH EXTRACTION    . TOTAL KNEE ARTHROPLASTY Left      Home Medications:  Prior to Admission medications   Medication Sig Start Date End Date Taking? Authorizing Provider  ADVAIR HFA 115-21 MCG/ACT inhaler Inhale 2 puffs into the lungs every 12 (twelve) hours. 01/29/16  Yes [provider]  amLODipine (NORVASC) 5 MG tablet TAKE 1 TABLET BY MOUTH EVERY DAY Patient taking differently: Take 5 mg by mouth daily.  08/24/17  Yes Lendon Colonel, NP  Ascorbic Acid (VITAMIN C) 500 MG CAPS Take 500 mg by mouth daily.   Yes [provider]  aspirin EC 81 MG tablet Take 1 tablet by mouth daily.   Yes [provider]  celecoxib (CELEBREX) 200 MG capsule Take 1 capsule (200 mg total) by mouth daily. 12/22/16  Yes [name], MD  cyclobenzaprine (FLEXERIL) 5 MG tablet Take 5 mg by mouth 2 (two) times daily.    Yes [provider]  Ferrous Sulfate (IRON) 325 (65 Fe) MG TABS Take 1 tablet by mouth daily. 12/10/15  Yes [provider]  gabapentin (NEURONTIN) 300 MG capsule Take 1 capsule by mouth 3 (three) times daily. 08/15/16  Yes [provider]  lisinopril (PRINIVIL,ZESTRIL) 10 MG tablet TAKE 1 TABLET BY MOUTH EVERY DAY Patient taking  differently: Take 10 mg by mouth daily.  08/24/17  Yes Lendon Colonel, NP  metoCLOPramide (REGLAN) 5 MG tablet Take 1 tablet by mouth 4 (four) times daily -  before meals and at bedtime. 01/25/16  Yes [provider]  metoprolol succinate (TOPROL-XL) 25 MG 24 hr tablet Take 25 mg by mouth daily.   Yes [provider]  mirabegron ER (MYRBETRIQ) 25 MG TB24 tablet Take 25 mg by mouth daily.   Yes [provider]  nitroGLYCERIN (NITROSTAT) 0.4 MG SL tablet Place 0.4 mg under the tongue every 5 (five) minutes as needed for chest pain.   Yes [provider]  omeprazole (PRILOSEC) 40 MG capsule Take 40 mg by mouth daily.    Yes [provider]  Oxycodone HCl 10 MG TABS Take 10 mg by mouth 4 (four) times daily.    Yes [provider]  potassium gluconate (HM POTASSIUM) 595 (99 K) MG TABS tablet Take 595 mg by mouth daily.   Yes [provider]  simvastatin (ZOCOR) 10 MG tablet Take 10 mg by mouth daily. 12/23/15  Yes [provider]  VENTOLIN HFA 108 (90 Base) MCG/ACT inhaler Inhale 2 puffs into the lungs every 4 (four) hours as needed for wheezing or shortness of breath.  12/11/15  Yes [provider]  KLOR-CON M20 20 MEQ tablet TAKE 1 TABLET BY MOUTH EVERY DAY Patient not taking: Reported on 11/25/2017 04/20/17   Lendon Colonel, NP    Inpatient Medications: Scheduled Meds: . amLODipine  5 mg Oral Daily  . Chlorhexidine Gluconate Cloth  6 each Topical Q0600  . lisinopril  10 mg Oral Daily  . metoprolol succinate  25 mg Oral Daily  . mometasone-formoterol  2 puff Inhalation BID  . mupirocin ointment  1 application Nasal BID  . pantoprazole  40 mg Oral Daily  . simvastatin  10 mg Oral Daily   Continuous Infusions: . cefTRIAXone (ROCEPHIN)  IV Stopped (11/26/17 2259)   PRN Meds: acetaminophen **OR** acetaminophen, albuterol, guaiFENesin, nitroGLYCERIN, ondansetron **OR** ondansetron (ZOFRAN) IV, polyethylene  glycol  Allergies:    Allergies  Allergen Reactions  . Aspirin     Regular asa     Social History:   Social History   Socioeconomic History  . Marital status: Widowed    Spouse name: Not on file  . Number of children: Not on file  . Years of education: Not on file  . Highest education level: Not on file  Occupational History  . Not on file  Social Needs  . Financial resource strain: Not on file  . Food insecurity:    Worry: Not on file    Inability: Not on file  . Transportation needs:    Medical: Not on file    Non-medical: Not on file  Tobacco Use  . Smoking status: Current Every Day Smoker    Packs/day: 1.00    Years: 70.00    Pack years: 70.00    Types: Cigarettes  . Smokeless tobacco: Never Used  Substance and Sexual Activity  . Alcohol use: No    Comment: 11/26/2017  "drank from the age of 8 til 03/05/1993"  . Drug use: Not Currently    Comment: 11/26/2017 "used some drugs when I was young"  . Sexual activity: Not Currently  Lifestyle  . Physical activity:    Days per week: Not on file    Minutes per session: Not on file  . Stress: Not on file  Relationships  . Social connections:    Talks on phone: Not on file    Gets together: Not on file    Attends religious service: Not on file    Active member of club or organization: Not on file    Attends meetings of clubs or organizations: Not on file    Relationship status: Not on file  . Intimate partner violence:    Fear of current or ex partner: Not on file    Emotionally abused: Not on file    Physically abused: Not on file    Forced sexual activity: Not on file  Other Topics Concern  . Not on file  Social History Narrative  . Not on file    Family History:    Family History  Adopted: Yes  Problem Relation Age of Onset  . Cancer Mother   . Obesity Father      ROS:  Please see the history of present illness.   All other ROS reviewed and negative.     Physical Exam/Data:   Vitals:    11/26/17 1700 11/26/17 2159 11/27/17 0410 11/27/17  0953  BP: (!) 145/65 (!) 150/71 136/86 (!) 150/76  Pulse: 71 78 76 77  Resp: 18 16 18 16   Temp: 97.7 F (36.5 C) 97.8 F (36.6 C) 98.2 F (36.8 C) 98.1 F (36.7 C)  TempSrc: Oral Oral Oral Oral  SpO2: 98% 95% 96% 98%  Weight:      Height:        Intake/Output Summary (Last 24 hours) at 11/27/2017 1226 Last data filed at 11/26/2017 2259 Gross per 24 hour  Intake 500 ml  Output -  Net 500 ml   Filed Weights   11/25/17 1514  Weight: 68 kg   Body mass index is 20.34 kg/m.  General:  Thin appearing elderly gentleman laying in bed in no acute distress HEENT: sclera anicteric  Neck: no JVD Vascular: No carotid bruits; distal pulses 2+ bilaterally Cardiac:  normal S1, S2; RRR; no murmurs, rubs, or gallops Lungs:  clear to auscultation bilaterally, no wheezing, rhonchi or rales  Abd: NABS, soft, nontender, no hepatomegaly Ext: no edema Musculoskeletal:  No deformities, BUE and BLE strength normal and equal Skin: warm and dry  Neuro:  CNs 2-12 intact, no focal abnormalities noted Psych:  Normal affect   EKG:  The EKG was personally reviewed and demonstrates:  Sinus rhythm with LAD but no STE/D, no TWI Telemetry:  Telemetry was personally reviewed and demonstrates:  Sinus rhythm with occasional PVCs.   Relevant CV Studies: Echocardiogram 11/26/17: Study Conclusions  - Left ventricle: The cavity size was normal. Wall thickness was   increased in a pattern of mild LVH. Systolic function was normal.   The estimated ejection fraction was in the range of 55% to 60%.   Wall motion was normal; there were no regional wall motion   abnormalities. Doppler parameters are consistent with abnormal   left ventricular relaxation (grade 1 diastolic dysfunction). - Aortic valve: Moderately calcified annulus. Trileaflet;   moderately thickened leaflets. Valve area (VTI): 2.94 cm^2. Valve   area (Vmax): 2.6 cm^2. Valve area (Vmean): 2.56  cm^2. - Mitral valve: Mildly calcified annulus. Mildly thickened leaflets   . - Left atrium: The atrium was mildly dilated. - Atrial septum: No defect or patent foramen ovale was identified. - Pulmonary arteries: Systolic pressure was mildly increased. PA   peak pressure: 37 mm Hg (S). - Technically adequate study.  Laboratory Data:  Chemistry Recent Labs  Lab 11/25/17 1530 11/26/17 0849 11/27/17 0651  NA 138 139 135  K 3.8 4.1 3.7  CL 105 105 101  CO2 24 26 22   GLUCOSE 118* 76 107*  BUN 24* 14 15  CREATININE 1.04 0.64 0.79  CALCIUM 8.7* 8.7* 9.3  GFRNONAA >60 >60 >60  GFRAA >60 >60 >60  ANIONGAP 9 8 12     No results for input(s): PROT, ALBUMIN, AST, ALT, ALKPHOS, BILITOT in the last 168 hours. Hematology Recent Labs  Lab 11/25/17 1530 11/26/17 0220 11/27/17 0651  WBC 27.5* 17.4* 13.6*  RBC 4.67 4.63 5.12  HGB 14.2 13.5 15.2  HCT 44.1 42.8 45.8  MCV 94.4 92.4 89.5  MCH 30.4 29.2 29.7  MCHC 32.2 31.5 33.2  RDW 16.8* 16.9* 15.9*  PLT 412* 381 399   Cardiac Enzymes Recent Labs  Lab 11/25/17 2020 11/26/17 0220 11/26/17 0749  TROPONINI <0.03 <0.03 <0.03    Recent Labs  Lab 11/25/17 1536  TROPIPOC 0.01    BNPNo results for input(s): BNP, PROBNP in the last 168 hours.  DDimer No results for input(s): DDIMER in the last  168 hours.  Radiology/Studies:  Dg Chest 2 View  Result Date: 11/25/2017 CLINICAL DATA:  Chest pain EXAM: CHEST - 2 VIEW COMPARISON:  08/08/2017 FINDINGS: Chronic interstitial opacities throughout the lungs, likely chronic lung disease/fibrosis. Heart is normal size. No effusions or acute bony abnormality. 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 Electronically Signed   By: Rolm Baptise M.D.   On: 11/25/2017 15:58   Ct Head Wo Contrast  Result Date: 11/25/2017 CLINICAL DATA:  Chest pain for the past 3 days. Altered mental status. Recurrent syncope. EXAM: CT HEAD  WITHOUT CONTRAST TECHNIQUE: Contiguous axial images were obtained from the base of the skull through the vertex without intravenous contrast. COMPARISON:  Maxillofacial CT dated 03/15/2016. FINDINGS: Brain: Old right frontal lobe infarct. Mild-to-moderate diffuse enlargement of the ventricles and subarachnoid spaces. Moderate patchy white matter low density in both cerebral hemispheres. No intracranial hemorrhage, mass lesion or CT evidence of acute infarction. Vascular: No hyperdense vessel or unexpected calcification. Skull: Normal. Negative for fracture or focal lesion. Sinuses/Orbits: Status post bilateral cataract extraction. Mucosal thickening and fluid in the right maxillary sinus with mild improvement. Resolved opacification of the right sphenoid sinus. Significantly improved right ethmoid sinus mucosal thickening. Other: None. IMPRESSION: 1. No acute abnormality. 2. Old right frontal lobe infarct. 3. Mild to moderate diffuse cerebral and cerebellar atrophy. 4. Moderate chronic small vessel white matter ischemic changes in both cerebral hemispheres. 5. Improved sinusitis. Electronically Signed   By: Claudie Revering M.D.   On: 11/25/2017 17:16   Ct Angio Chest Pe W Or Wo Contrast  Result Date: 11/25/2017 CLINICAL DATA:  Chest pain EXAM: CT ANGIOGRAPHY CHEST WITH CONTRAST TECHNIQUE: Multidetector CT imaging of the chest was performed using the standard protocol during bolus administration of intravenous contrast. Multiplanar CT image reconstructions and MIPs were obtained to evaluate the vascular anatomy. CONTRAST:  178mL ISOVUE-370 IOPAMIDOL (ISOVUE-370) INJECTION 76% COMPARISON:  Chest radiograph 11/25/2017 FINDINGS: Cardiovascular: --Pulmonary arteries: Contrast injection is sufficient to demonstrate satisfactory opacification of the pulmonary arteries to the segmental level. There is no pulmonary embolus. The main pulmonary artery is within normal limits for size. --Aorta: Limited opacification of the  aorta due to bolus timing optimization for the pulmonary arteries. Conventional 3 vessel aortic branching pattern. The aortic course and caliber are normal. There is mild aortic atherosclerosis. --Heart: Normal size. No pericardial effusion. Mediastinum/Nodes: No mediastinal, hilar or axillary lymphadenopathy. The visualized thyroid and thoracic esophageal course are unremarkable. Lungs/Pleura: Advanced emphysema. No pleural effusion or pneumothorax. There is central peribronchovascular thickening. There is a mass along the posterior aspect of the left lower lobe bronchus encasing multiple pulmonary artery branches and measuring approximately 3.8 x 2.9 cm. This is best demonstrated on sagittal image 104 (series 8). There may be an intrabronchial component within the LLL bronchus. Upper Abdomen: Contrast bolus timing is not optimized for evaluation of the abdominal organs. Within this limitation, the visualized organs of the upper abdomen are normal. Musculoskeletal: Old right tenth and eleventh rib fractures. Review of the MIP images confirms the above findings. IMPRESSION: 1. No pulmonary embolus. 2. Left lower lobe peribronchial mass, abutting the posterior aspect of the left lower lobe bronchus and encasing segmental branches of the pulmonary arteries. 3. Extensive emphysema and/or interstitial lung disease. Aortic Atherosclerosis (ICD10-I70.0) and Emphysema (ICD10-J43.9). Electronically Signed   By: Ulyses Jarred M.D.   On: 11/25/2017 20:41    Assessment and Plan:   1. Transient amnesia with possible syncope: episode occurred on  Sunday 11/22/17 while sitting on his bed. The next thing he remembers is waking up on Wednesday 11/25/17. He had been managing a cold with OTC medications that weekend and reported having a fever Sunday morning. No complaints of palpitations or changes in chest pain surrounding event. Troponins have been negative. EKG and telemetry without ischemia or arrhythmias. Found to have a  UTI on admission which may have contributed. Presentation not consistent with cardiac etiology.  - No further cardiac work-up at this time   2. Chest pain in patient with mild non-obstructive CAD in OM2: he reports constant chest pain for the past 2 decades. Not exertional or related to food. No changes in the quantity/quality of his chest pain. Troponins negative. EKG non-ischemic. Catheterization 1 year ago with 30% stenosis of OM2, otherwise normal coronaries. Unlikely CAD has progressed.  - No further cardiac work-up at this time. - Could consider titration of amlodipine/metoprolol outpatient  3. HTN: BP initially soft on admission, felt to be 2/2 poor po intake and improved with IVFs. Now with hypertension.  - Will increase lisinopril to 20mg  daily - Continue amlodpine and metoprolol at current dose but could consider uptitration outpatient if BP allows.   4. HLD:  - Continue statin  5. Lung mass: noted on CT chest this admission. Seen by pulmonary who recommends outpatient work-up - Continue management per primary team  6. UTI: on CTX for UCx + for GNR.  - Continue management per primary team.   [facility] will sign off.   Medication Recommendations:  Increase lisinopril to 20mg  daily; continue remainder of other medications Other recommendations (labs, testing, etc):  None Follow up as an outpatient:  Will arrange follow-up with Dr. Adonis Huguenin in 1 month.     For questions or updates, please contact [facility] Please consult [email or web address] for contact info under Cardiology/STEMI.   Signed, Abigail Butts, PA-C  11/27/2017 12:26 PM [phone] ---------------------------------------------------------------------------------------------   History and all data above reviewed.  Patient examined.  I agree with the findings as above.  Bradley [name] feels back to normal and ready to go home to his adopted granddaughter and his dog.  He feels as if he had an upper  respiratory illness that was a driver for many of his recent symptoms.  Constitutional: No acute distress Eyes: pupils equally round and reactive to light, ENMT:  moist mucous membranes Cardiovascular: regular rhythm, normal rate, no murmurs. S1 and S2 normal. Radial pulses normal bilaterally. No jugular venous distention.  Respiratory: clear to auscultation bilaterally GI : normal bowel sounds, soft and nontender. No distention.   MSK: extremities warm, well perfused. No edema.  NEURO: grossly nonfocal exam, moves all extremities. PSYCH: alert and oriented x 3, normal mood and affect.   All available labs, radiology testing, previous records reviewed. Agree with documented assessment and plan of my colleague as stated above with the following additions or changes:  Principal Problem:   Chest pain Active Problems:   Tobacco abuse   Emphysema, unspecified (HCC)   CAD (coronary artery disease), native coronary artery   Essential hypertension   Hyperlipidemia   Chest pain in adult   Syncope and collapse   Plan: It is likely he experienced an episode of transient global amnesia in the setting of multiple medical comorbidities.  It is unclear if his episode truly represents syncope or just amnesia.  His echocardiogram does not show left ventricular outflow tract obstruction or significant aortic stenosis, and there are no other findings to suggest  an etiology for syncope.  His troponins were unremarkable as well as his ECG.  He should continue follow-up as an outpatient for his other comorbidities and should follow-up in cardiology clinic in approximately 1 month's time.  Elouise Munroe, MD [facility] 6:31 PM  11/27/2017

## 2017-11-27 NOTE — Progress Notes (Signed)
Patient interviewed and examined.  Ongoing chronic chest pain that he has had for couple decades.  No new complaints.  Vital signs and physical exam stable and unremarkable.  I consulted cardiology who evaluated and signed off.  I discharged patient but unfortunately too late for him to get a ride home.  Thereby patient will DC home 11/2.  Discharge paperwork has been completed.  DC summary will be completed tomorrow.  Discussed with patient's RN.  Vernell Leep, MD, FACP, [facility]. [facility] Pager [phone]  If 7PM-7AM, please contact night-coverage [email or web address] Password [facility] 11/27/2017, 8:39 PM

## 2017-11-27 NOTE — Telephone Encounter (Signed)
Seen during hospital visit. Needs outpatient PET scan and PFTs followed by office visit in 2 weeks

## 2017-11-27 NOTE — Discharge Summary (Signed)
Physician Discharge Summary  Bradley [name] DUK:[id number] DOB: August 06, 1940  PCP: Jani Gravel, MD  Admit date: 11/25/2017 Discharge date: 11/28/2017  Recommendations for Outpatient Follow-up:  1. Dr. Jani Gravel or Allene Dillon, NP/ PCP in 1 week with repeat labs (CBC & BMP).  PCP to assist patient with outpatient PET scan to evaluate suspected lung mass and Pulmonology follow-up.  Please follow-up final urine culture results that were sent from the hospital. 2. Dr. Kara Mead, Pulmonology for further evaluation of lung mass.  He will need outpatient PFTs.  I have sent an in basket message to Dr. Elsworth Soho to arrange follow-up. 3. Estella Husk, PA-C/Cardiology on 01/04/2018 at 11:30 AM. 4. Consider outpatient Neurology consultation for further evaluation of syncope and need for OP EEG.  Home Health: None Equipment/Devices: None  Discharge Condition: Improved and stable. CODE STATUS: Full Diet recommendation: Heart healthy diet.  Discharge Diagnoses:  Principal Problem:   Chest pain Active Problems:   Tobacco abuse   Emphysema, unspecified (HCC)   CAD (coronary artery disease), native coronary artery   Essential hypertension   Hyperlipidemia   Chest pain in adult   Syncope and collapse   Brief Summary: 77 year old male with PMH of HTN, HLD, mild nonobstructive CAD by cath 2018, ongoing heavy tobacco abuse, chronic pain including chronic chest pain on chronic opioids, alcohol use, presented initially to [facility] on 11/25/2017 with complaints of chest pain, lightheadedness, dyspnea, passing out, urinary frequency and dysuria.  Patient's daughter was concerned about patient's memory.  In the ED, BP 88/55, improved after fluid bolus, leukocytosis/27K, Hospitalist's were called to admit for chest pain and UTI.  CTA chest then was negative for PE but showed left lower lobe peribronchial mass.  There was no Pulmonologist available at [facility] and hence patient was transferred  to [facility] on 10/31 for evaluation by pulmonologist.  As per further history obtained by Cardiology, patient was having a cold over the weekend prior to admission which he was managing with OTC supportive care (cough suppressants, antihistaminics and decongestants) and felt generally unwell on 11/22/2017 morning.  He reported having a fever that morning and was sitting on the edge of his bed rolling cigarettes when he became lightheaded so he laid back in bed.  The next thing he remembers is getting dressed, for what he thought was church on Sunday, but his daughter told him it was Wednesday, 11/25/2017.  His daughter told him that he had been eating his meals over the previous few days and acting normal but the patient had no memory of these events.  Patient reports chronic chest pain for decades which he describes as an ache which varies in intensity from 5/10 to a 8/10 and he also stated that this chest pain was no worse than his chronic pain.  He had undergone cardiac catheterization 08/2016 which showed 30% stenosis of OM 2, otherwise normal coronaries.  TTE 11/2016 showed LVEF 60-65% and grade 1 diastolic dysfunction.   Assessment and plan:  1. Left lower lobe lung mass-principal discharge diagnosis: In a patient with history of heavy tobacco abuse and severe emphysema.  Pulmonology evaluated and indicated that CT was not convincing for an endobronchial extension but it is likely to be lung malignancy.  They recommended PET scan, PFTs as an outpatient and follow-up with them after these tests to decide on best route of biopsy, bronchoscopy versus EBUS.  I discussed with Pulmonology as well as patient's PCP who will coordinate follow-up.  I also discussed  with patient and his daughter to make sure that he follows up on this.  They verbalized understanding. 2. Severe emphysema: Noted on CT.  No clinical bronchospasm.  Tobacco cessation counseled.  Continue prior home medication regimen and outpatient follow-up  with pulmonology. 3. Tobacco abuse: Patient rolls his own cigarettes.  Counseled extensively regarding tobacco cessation. 4. Chronic chest pain: Patient reports chronic chest pain for the past 2 decades and there was no worsening of same.  Cardiology was consulted.  Troponins negative, EKG nonischemic, cardiac catheterization a year ago showed 30% stenosis of OM 2, otherwise normal coronaries.  They did not feel that his CAD had progressed and recommended no further cardiac work-up at this time.  Continue aspirin, metoprolol and statins.  Also on chronic pain medications. 5. Suspected syncope: Possibly from acute illness- UTI, hypotension on admission but history not very clear.  Cardiac work-up negative.  CT head without acute abnormality.  CTA chest showed no PE.  Telemetry showed sinus rhythm without arrhythmias.  Cardiology did not recommend any further work-up.  Patient and daughter were counseled that he should not drive for 6 months or until cleared by his physician during office visit.  They verbalized understanding.  I also discussed with Neuro Hospitalist on 11/1 who indicated that his transient amnesia may be transient global amnesia but may not be reasonable to consider outpatient Neurology consultation and outpatient EEG.  Ambulated without symptoms. 6. Essential hypertension/hypotension: Hypotensive on admission possibly due to acute infection and poor oral intake.  Subsequently hypertensive.  Continue prior home dose of amlodipine and metoprolol.  Lisinopril increased to 20 mg daily.  Outpatient follow-up. 7. Hyperlipidemia: Continue statins. 8. E. coli acute cystitis: Patient completed 3 days of IV ceftriaxone in the hospital.  Treated. 9. GERD: Continue PPI. 10. Chronic pain: Remains on prior home regimen. 11. Leukocytosis: Likely due to UTI and stress response.  Much improved and approaching normal.  Follow CBC as  outpatient.  Consultations:  Pulmonology  Cardiology  Procedures:  None   Discharge Instructions  Discharge Instructions    Call MD for:   Complete by:  As directed    Passing out or feeling like passing out.   Call MD for:  difficulty breathing, headache or visual disturbances   Complete by:  As directed    Call MD for:  extreme fatigue   Complete by:  As directed    Call MD for:  persistant dizziness or light-headedness   Complete by:  As directed    Call MD for:  severe uncontrolled pain   Complete by:  As directed    Diet - low sodium heart healthy   Complete by:  As directed    Driving Restrictions   Complete by:  As directed    Patient has been advised not to drive for 6 months or until cleared by his physician during office visit.   Increase activity slowly   Complete by:  As directed        Medication List    STOP taking these medications   KLOR-CON M20 20 MEQ tablet Generic drug:  potassium chloride SA     TAKE these medications   ADVAIR HFA 115-21 MCG/ACT inhaler Generic drug:  fluticasone-salmeterol Inhale 2 puffs into the lungs every 12 (twelve) hours.   amLODipine 5 MG tablet Commonly known as:  NORVASC TAKE 1 TABLET BY MOUTH EVERY DAY   aspirin EC 81 MG tablet Take 1 tablet by mouth daily.   celecoxib 200 MG  capsule Commonly known as:  CELEBREX Take 1 capsule (200 mg total) by mouth daily.   cephALEXin 500 MG capsule Commonly known as:  KEFLEX Take 1 capsule (500 mg total) by mouth 2 (two) times daily for 1 day.   cyclobenzaprine 5 MG tablet Commonly known as:  FLEXERIL Take 5 mg by mouth 2 (two) times daily.   gabapentin 300 MG capsule Commonly known as:  NEURONTIN Take 1 capsule by mouth 3 (three) times daily.   HM POTASSIUM 595 (99 K) MG Tabs tablet Generic drug:  potassium gluconate Take 595 mg by mouth daily.   Iron 325 (65 Fe) MG Tabs Take 1 tablet by mouth daily.   lisinopril 20 MG tablet Commonly known as:   PRINIVIL,ZESTRIL Take 1 tablet (20 mg total) by mouth daily. What changed:    medication strength  how much to take   metoCLOPramide 5 MG tablet Commonly known as:  REGLAN Take 1 tablet by mouth 4 (four) times daily -  before meals and at bedtime.   metoprolol succinate 25 MG 24 hr tablet Commonly known as:  TOPROL-XL Take 25 mg by mouth daily.   MYRBETRIQ 25 MG Tb24 tablet Generic drug:  mirabegron ER Take 25 mg by mouth daily.   nitroGLYCERIN 0.4 MG SL tablet Commonly known as:  NITROSTAT Place 0.4 mg under the tongue every 5 (five) minutes as needed for chest pain.   omeprazole 40 MG capsule Commonly known as:  PRILOSEC Take 40 mg by mouth daily.   Oxycodone HCl 10 MG Tabs Take 10 mg by mouth 4 (four) times daily.   simvastatin 10 MG tablet Commonly known as:  ZOCOR Take 10 mg by mouth daily.   VENTOLIN HFA 108 (90 Base) MCG/ACT inhaler Generic drug:  albuterol Inhale 2 puffs into the lungs every 4 (four) hours as needed for wheezing or shortness of breath.   Vitamin C 500 MG Caps Take 500 mg by mouth daily.      Follow-up Information    Imogene Burn, PA-C Follow up on 01/04/2018.   Specialty:  Cardiology Why:  Please arrive 15 minutes early for your 11:30am post-hospital cardiology appointment Contact information: [place] [address] [phone]        Rigoberto Noel, MD. Schedule an appointment as soon as possible for a visit.   Specialty:  Pulmonary Disease Why:  Need to follow-up regarding further evaluation of your lung mass. Contact information: [address] [address] [phone]        Jani Gravel, MD. Schedule an appointment as soon as possible for a visit in 1 week(s).   Specialty:  Internal Medicine Why:  Please arrange outpatient PET scan and assist patient with pulmonology follow-up.  To be seen with repeat labs (CBC & BMP). Contact information: [place]  [address] [phone]        Herminio Commons, MD .   Specialty:  Cardiology Contact information: [place] [address] [phone]          Allergies  Allergen Reactions  . Aspirin     Regular asa       Procedures/Studies: Dg Chest 2 View  Result Date: 11/25/2017 CLINICAL DATA:  Chest pain EXAM: CHEST - 2 VIEW COMPARISON:  08/08/2017 FINDINGS: Chronic interstitial opacities throughout the lungs, likely chronic lung disease/fibrosis. Heart is normal size. No effusions or acute bony abnormality. 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 Electronically Signed   By: Rolm Baptise M.D.   On: 11/25/2017 15:58   Ct Head Wo Contrast  Result Date: 11/25/2017 CLINICAL DATA:  Chest pain for the past 3 days. Altered mental status. Recurrent syncope. EXAM: CT HEAD WITHOUT CONTRAST TECHNIQUE: Contiguous axial images were obtained from the base of the skull through the vertex without intravenous contrast. COMPARISON:  Maxillofacial CT dated 03/15/2016. FINDINGS: Brain: Old right frontal lobe infarct. Mild-to-moderate diffuse enlargement of the ventricles and subarachnoid spaces. Moderate patchy white matter low density in both cerebral hemispheres. No intracranial hemorrhage, mass lesion or CT evidence of acute infarction. Vascular: No hyperdense vessel or unexpected calcification. Skull: Normal. Negative for fracture or focal lesion. Sinuses/Orbits: Status post bilateral cataract extraction. Mucosal thickening and fluid in the right maxillary sinus with mild improvement. Resolved opacification of the right sphenoid sinus. Significantly improved right ethmoid sinus mucosal thickening. Other: None. IMPRESSION: 1. No acute abnormality. 2. Old right frontal lobe infarct. 3. Mild to moderate diffuse cerebral and cerebellar atrophy. 4. Moderate chronic small vessel white matter ischemic changes in both cerebral  hemispheres. 5. Improved sinusitis. Electronically Signed   By: Claudie Revering M.D.   On: 11/25/2017 17:16   Ct Angio Chest Pe W Or Wo Contrast  Result Date: 11/25/2017 CLINICAL DATA:  Chest pain EXAM: CT ANGIOGRAPHY CHEST WITH CONTRAST TECHNIQUE: Multidetector CT imaging of the chest was performed using the standard protocol during bolus administration of intravenous contrast. Multiplanar CT image reconstructions and MIPs were obtained to evaluate the vascular anatomy. CONTRAST:  118mL ISOVUE-370 IOPAMIDOL (ISOVUE-370) INJECTION 76% COMPARISON:  Chest radiograph 11/25/2017 FINDINGS: Cardiovascular: --Pulmonary arteries: Contrast injection is sufficient to demonstrate satisfactory opacification of the pulmonary arteries to the segmental level. There is no pulmonary embolus. The main pulmonary artery is within normal limits for size. --Aorta: Limited opacification of the aorta due to bolus timing optimization for the pulmonary arteries. Conventional 3 vessel aortic branching pattern. The aortic course and caliber are normal. There is mild aortic atherosclerosis. --Heart: Normal size. No pericardial effusion. Mediastinum/Nodes: No mediastinal, hilar or axillary lymphadenopathy. The visualized thyroid and thoracic esophageal course are unremarkable. Lungs/Pleura: Advanced emphysema. No pleural effusion or pneumothorax. There is central peribronchovascular thickening. There is a mass along the posterior aspect of the left lower lobe bronchus encasing multiple pulmonary artery branches and measuring approximately 3.8 x 2.9 cm. This is best demonstrated on sagittal image 104 (series 8). There may be an intrabronchial component within the LLL bronchus. Upper Abdomen: Contrast bolus timing is not optimized for evaluation of the abdominal organs. Within this limitation, the visualized organs of the upper abdomen are normal. Musculoskeletal: Old right tenth and eleventh rib fractures. Review of the MIP images confirms the  above findings. IMPRESSION: 1. No pulmonary embolus. 2. Left lower lobe peribronchial mass, abutting the posterior aspect of the left lower lobe bronchus and encasing segmental branches of the pulmonary arteries. 3. Extensive emphysema and/or interstitial lung disease. Aortic Atherosclerosis (ICD10-I70.0) and Emphysema (ICD10-J43.9). Electronically Signed   By: Ulyses Jarred M.D.   On: 11/25/2017 20:41      Subjective: Denies complaints.  No chest pain reported today.  Ambulating to bathroom without dizziness, lightheadedness, palpitations.  As per RN, no acute issues noted.  Discharge Exam:  Vitals:   11/27/17 0953 11/27/17 1636 11/27/17 2342 11/28/17 0532  BP: (!) 150/76 135/64 122/69 (!) 124/92  Pulse: 77 73 70 69  Resp: 16 16  17   Temp: 98.1 F (36.7 C) 98.2  F (36.8 C) 98.4 F (36.9 C) 98.3 F (36.8 C)  TempSrc: Oral Oral Oral Oral  SpO2: 98% 98% 97% 96%  Weight:      Height:        General: Pleasant elderly male, moderately built and nourished lying comfortably propped up in bed. Cardiovascular: S1 & S2 heard, RRR, S1/S2 +. No murmurs, rubs, gallops or clicks. No JVD or pedal edema.  Telemetry personally reviewed: Sinus rhythm. Respiratory: Distant breath sounds but clear to auscultation without wheezing, rhonchi or crackles. No increased work of breathing. Abdominal:  Non distended, non tender & soft. No organomegaly or masses appreciated. Normal bowel sounds heard. CNS: Alert and oriented. No focal deficits. Extremities: no edema, no cyanosis    The results of significant diagnostics from this hospitalization (including imaging, microbiology, ancillary and laboratory) are listed below for reference.     Microbiology: Recent Results (from the past 240 hour(s))  Urine culture     Status: Abnormal (Preliminary result)   Collection Time: 11/25/17  4:32 PM  Result Value Ref Range Status   Specimen Description   Final    URINE, RANDOM Performed at [facility],  [address] [place], [place] [address]    Special Requests   Final    NONE Performed at [facility], [address], [place], [place] [address]    Culture >=100,000 COLONIES/mL GRAM NEGATIVE RODS (A)  Final   Report Status PENDING  Incomplete  Culture, blood (routine x 2)     Status: None (Preliminary result)   Collection Time: 11/25/17  4:55 PM  Result Value Ref Range Status   Specimen Description RIGHT ANTECUBITAL  Final   Special Requests   Final    BOTTLES DRAWN AEROBIC AND ANAEROBIC Blood Culture results may not be optimal due to an excessive volume of blood received in culture bottles   Culture   Final    NO GROWTH 3 DAYS Performed at [facility], [address] [place], [place] [address]    Report Status PENDING  Incomplete  Culture, blood (routine x 2)     Status: None (Preliminary result)   Collection Time: 11/25/17  8:15 PM  Result Value Ref Range Status   Specimen Description BLOOD RIGHT HAND  Final   Special Requests   Final    BOTTLES DRAWN AEROBIC AND ANAEROBIC Blood Culture adequate volume   Culture   Final    NO GROWTH 3 DAYS Performed at [facility], [address] [place], [place] [address]    Report Status PENDING  Incomplete  MRSA PCR Screening     Status: Abnormal   Collection Time: 11/26/17  6:48 PM  Result Value Ref Range Status   MRSA by PCR POSITIVE (A) NEGATIVE Final    Comment:        The GeneXpert MRSA Assay (FDA approved for NASAL specimens only), is one component of a comprehensive MRSA colonization surveillance program. It is not intended to diagnose MRSA infection nor to guide or monitor treatment for MRSA infections. CRITICAL RESULT CALLED TO, READ BACK BY AND VERIFIED WITH: Gareth Eagle RN, AT 2202 11/26/17 BY Rush Landmark Performed at [facility], [place] [address], [place], [place] [address]      Labs: CBC: Recent Labs  Lab 11/25/17 1530 11/26/17 0220 11/27/17 0651  WBC 27.5* 17.4* 13.6*  NEUTROABS 22.6*  --   --   HGB 14.2  13.5 15.2  HCT 44.1 42.8 45.8  MCV 94.4 92.4 89.5  PLT  412* 381 256   Basic Metabolic Panel: Recent Labs  Lab 11/25/17 1530 11/26/17 0849 11/27/17 0651  NA 138 139 135  K 3.8 4.1 3.7  CL 105 105 101  CO2 24 26 22   GLUCOSE 118* 76 107*  BUN 24* 14 15  CREATININE 1.04 0.64 0.79  CALCIUM 8.7* 8.7* 9.3   Cardiac Enzymes: Recent Labs  Lab 11/25/17 2020 11/26/17 0220 11/26/17 0749  CKTOTAL 42*  --   --   TROPONINI <0.03 <0.03 <0.03   Urinalysis    Component Value Date/Time   COLORURINE AMBER (A) 11/25/2017 1632   APPEARANCEUR CLOUDY (A) 11/25/2017 1632   LABSPEC 1.021 11/25/2017 1632   PHURINE 5.0 11/25/2017 1632   GLUCOSEU NEGATIVE 11/25/2017 1632   HGBUR NEGATIVE 11/25/2017 1632   BILIRUBINUR SMALL (A) 11/25/2017 [place] 11/25/2017 1632   PROTEINUR 30 (A) 11/25/2017 1632   NITRITE NEGATIVE 11/25/2017 1632   LEUKOCYTESUR MODERATE (A) 11/25/2017 1632    I discussed in detail with patient's daughter on 11/1, updated care and answered questions.  Time coordinating discharge: 40 minutes  SIGNED:  Vernell Leep, MD, FACP, [facility]. [facility] Pager [phone]  If 7PM-7AM, please contact night-coverage [email or web address] Password [email or web address] 11/28/2017, 8:30 AM

## 2017-11-27 NOTE — Telephone Encounter (Signed)
-----   Message from Modena Jansky, MD sent at 11/27/2017  3:17 PM EDT ----- Dola Factor,  Just a reminder like we discussed> you saw this patient at [facility] on 11/26/2017. He was transferred from [facility] for evaluation of a lung mass. Please arrange OP follow up with you for further work up.  Thank you  Regards Everlene Farrier

## 2017-11-28 DIAGNOSIS — R0789 Other chest pain: Secondary | ICD-10-CM | POA: Diagnosis not present

## 2017-11-28 NOTE — Progress Notes (Signed)
Patient was interviewed and examined this morning.  He had no complaints.  He did not even complain of chest pain.  Vital signs and physical exam were stable without changes.  Patient was discharged late yesterday but did not have a ride home and hence left today.  DC summary completed.  Discussed with RN.  Vernell Leep, MD, FACP, [facility]. [facility] Pager [phone]  If 7PM-7AM, please contact night-coverage [email or web address] Password [facility] 11/28/2017, 4:47 PM

## 2017-11-28 NOTE — Progress Notes (Signed)
Pt taken by wheelchair to [facility] where he is being picked up by a family friend. Pt A&OX4 educated with feedback about picking up new medications and changes made to other medications. NAD. VSS.

## 2017-11-29 LAB — URINE CULTURE: Culture: >=100000 — AB

## 2017-11-30 LAB — CULTURE, BLOOD (ROUTINE X 2)
CULTURE: NO GROWTH
Culture: NO GROWTH
SPECIAL REQUESTS: ADEQUATE

## 2017-12-01 DIAGNOSIS — M545 Low back pain: Secondary | ICD-10-CM | POA: Diagnosis not present

## 2017-12-01 DIAGNOSIS — R918 Other nonspecific abnormal finding of lung field: Secondary | ICD-10-CM | POA: Diagnosis not present

## 2017-12-01 DIAGNOSIS — J439 Emphysema, unspecified: Secondary | ICD-10-CM | POA: Diagnosis not present

## 2017-12-01 DIAGNOSIS — I10 Essential (primary) hypertension: Secondary | ICD-10-CM | POA: Diagnosis not present

## 2017-12-01 DIAGNOSIS — Z79899 Other long term (current) drug therapy: Secondary | ICD-10-CM | POA: Diagnosis not present

## 2017-12-01 DIAGNOSIS — K219 Gastro-esophageal reflux disease without esophagitis: Secondary | ICD-10-CM | POA: Diagnosis not present

## 2017-12-01 NOTE — Telephone Encounter (Signed)
Yes please - after these tests

## 2017-12-01 NOTE — Telephone Encounter (Signed)
Will go ahead and place orders for the test and the schedule OV afterwards.

## 2017-12-01 NOTE — Telephone Encounter (Signed)
Per the schedule, you do not have anything available within the next 2 weeks. Are you ok with me double booking him somewhere?

## 2017-12-07 ENCOUNTER — Inpatient Hospital Stay: Payer: Medicare Other | Admitting: Nurse Practitioner

## 2017-12-08 ENCOUNTER — Ambulatory Visit (HOSPITAL_COMMUNITY)
Admission: RE | Admit: 2017-12-08 | Discharge: 2017-12-08 | Disposition: A | Discharge status: Home / Self Care | Payer: Medicare Other | Source: Ambulatory Visit | Attending: Pulmonary Disease | Admitting: Pulmonary Disease

## 2017-12-08 DIAGNOSIS — R911 Solitary pulmonary nodule: Secondary | ICD-10-CM | POA: Insufficient documentation

## 2017-12-08 LAB — GLUCOSE, CAPILLARY: Glucose-Capillary: 97 mg/dL (ref 70–99)

## 2017-12-08 MED ORDER — FLUDEOXYGLUCOSE F - 18 (FDG) INJECTION
8.4600 | Freq: Once | INTRAVENOUS | Status: AC | PRN
  Administered 2017-12-08: 8.46 via INTRAVENOUS

## 2017-12-09 ENCOUNTER — Ambulatory Visit (HOSPITAL_COMMUNITY)
Admission: RE | Admit: 2017-12-09 | Discharge: 2017-12-09 | Disposition: A | Discharge status: Home / Self Care | Payer: Medicare Other | Source: Ambulatory Visit | Attending: Pulmonary Disease | Admitting: Pulmonary Disease

## 2017-12-09 ENCOUNTER — Ambulatory Visit (INDEPENDENT_AMBULATORY_CARE_PROVIDER_SITE_OTHER): Payer: Medicare Other | Admitting: Nurse Practitioner

## 2017-12-09 ENCOUNTER — Encounter: Payer: Self-pay | Admitting: Nurse Practitioner

## 2017-12-09 VITALS — BP 126/72 | HR 80 | Ht 73.0 in | Wt 153.4 lb

## 2017-12-09 DIAGNOSIS — Z720 Tobacco use: Secondary | ICD-10-CM | POA: Diagnosis not present

## 2017-12-09 DIAGNOSIS — J439 Emphysema, unspecified: Secondary | ICD-10-CM

## 2017-12-09 DIAGNOSIS — R911 Solitary pulmonary nodule: Secondary | ICD-10-CM | POA: Diagnosis not present

## 2017-12-09 DIAGNOSIS — R918 Other nonspecific abnormal finding of lung field: Secondary | ICD-10-CM | POA: Diagnosis not present

## 2017-12-09 LAB — PULMONARY FUNCTION TEST
DL/VA % PRED: 38 %
DL/VA: 1.79 ml/min/mmHg/L
DLCO COR: 10.25 ml/min/mmHg
DLCO cor % pred: 29 %
DLCO unc % pred: 29 %
DLCO unc: 10.42 ml/min/mmHg
FEF 25-75 POST: 1.24 L/s
FEF 25-75 PRE: 1.19 L/s
FEF2575-%CHANGE-POST: 4 %
FEF2575-%PRED-PRE: 51 %
FEF2575-%Pred-Post: 53 %
FEV1-%Change-Post: 3 %
FEV1-%PRED-PRE: 76 %
FEV1-%Pred-Post: 78 %
FEV1-POST: 2.55 L
FEV1-Pre: 2.47 L
FEV1FVC-%CHANGE-POST: 3 %
FEV1FVC-%PRED-PRE: 78 %
FEV6-%CHANGE-POST: -1 %
FEV6-%Pred-Post: 100 %
FEV6-%Pred-Pre: 101 %
FEV6-Post: 4.22 L
FEV6-Pre: 4.27 L
FEV6FVC-%Change-Post: 0 %
FEV6FVC-%Pred-Post: 103 %
FEV6FVC-%Pred-Pre: 103 %
FVC-%Change-Post: 0 %
FVC-%PRED-POST: 96 %
FVC-%PRED-PRE: 97 %
FVC-POST: 4.36 L
FVC-PRE: 4.38 L
POST FEV6/FVC RATIO: 97 %
PRE FEV1/FVC RATIO: 56 %
Post FEV1/FVC ratio: 59 %
Pre FEV6/FVC Ratio: 98 %
RV % pred: 100 %
RV: 2.72 L
TLC % PRED: 93 %
TLC: 6.94 L

## 2017-12-09 MED ORDER — BUPROPION HCL ER (SR) 150 MG PO TB12: 150.0000 mg | ORAL_TABLET | Freq: Two times a day (BID) | ORAL | 0 refills | Status: DC

## 2017-12-09 MED ORDER — ALBUTEROL SULFATE (2.5 MG/3ML) 0.083% IN NEBU
2.5000 mg | INHALATION_SOLUTION | Freq: Once | RESPIRATORY_TRACT | Status: AC
  Administered 2017-12-09: 2.5 mg via RESPIRATORY_TRACT

## 2017-12-09 NOTE — Patient Instructions (Addendum)
Continue Advair Continue pro air as needed for shortness of breath  Plan to quit smoking: Start Wellbutrin at 1 tablet a day for 7 days  Stop smoking after 1 week of treatment Then take 1 tablet of Wellbutrin in the morning and 1 tablet in the afternoon  Discussed PET scan and PFT in office:  EBUS on 12/15/17 at 7:30 am at [facility]  Will order sleep study  Follow up in 2 weeks with Dr. Elsworth Soho

## 2017-12-09 NOTE — Progress Notes (Signed)
@Patient  ID: Bradley [name], male    DOB: 16-Jan-1941, 77 y.o.   MRN: [id number]  Chief Complaint  Patient presents with  . Hospitalization Follow-up    Referring provider: Jani Gravel, MD  HPI 77 year old male current smoker with emphysema and lung mass followed by Dr. Elsworth Soho.   Tests: CT chest 11/25/17 - No pulmonary embolus. Left lower lobe peribronchial mass, abutting the posterior aspect of the left lower lobe bronchus and encasing segmental branches of the pulmonary arteries. Extensive emphysema and/or interstitial lung disease.  PET scan 12/08/17: The central left lower lobe perihilar lung mass exhibits intense FDG uptake and is concerning for primary bronchogenic carcinoma. Mild to moderate uptake is associated with the right paratracheal and right hilar lymph nodes. Cannot rule out metastatic adenopathy to these areas. Since the exam from 11/25/2017 there is been interval development of multifocal patchy nodular densities within the right upper lobe. These exhibit moderate increased radiotracer uptake with SUV max up to 7.55. As these appear new from 11/25/2017 it is likely that these are postinflammatory/infectious in etiology. Unusual, aggressive metastatic disease is considered less favored, nevertheless follow-up imaging is advised to ensure complete resolution to rule out foci of metastasis versus synchronous primary neoplasms.  PFT Results Latest Ref Rng & Units 12/09/2017  FVC-Pre L 4.38  FVC-Predicted Pre % 97  FVC-Post L 4.36  FVC-Predicted Post % 96  Pre FEV1/FVC % % 56  Post FEV1/FCV % % 59  FEV1-Pre L 2.47  FEV1-Predicted Pre % 76  FEV1-Post L 2.55  DLCO UNC% % 29  DLCO COR %Predicted % 38  TLC L 6.94  TLC % Predicted % 93  RV % Predicted % 100   OV 12/10/17 - Hospital follow up 11/25/17 - 11/28/17 Patient presents for hospital follow up. Patient originally presented to [facility] on 11/25/17 with complaints of chest pain, lightheadedness, dyspnea,  passing out, urinary frequency and dysuria. CTA was negative for PE, but showed left lower lobe peribronchial mass. He was then transferred to [facility]. He was treated for UTI with IV antibiotics. He states that he has been doing well since discharge. He had a PET scan and had a PFT done this morning. He denies any shortness of breath, fever, or edema. He is followed by pain management for chronic pain from arthritis and chronic chest pain (has had complete cardiac work-up). He still smokes, but is ready to try to quit. He is requesting to try wellbutrin to try to help him quit.       Allergies  Allergen Reactions  . Aspirin     Regular asa     Immunization History  Administered Date(s) Administered  . Influenza, High Dose Seasonal PF 11/14/2016  . Influenza-Unspecified 11/03/2016  . Pneumococcal Polysaccharide-23 09/12/2016    Past Medical History:  Diagnosis Date  . Chronic lower back pain   . Contact with stonefish as cause of accidental injury 1954   "stung across my left hand in [place]; LUE weaker since"  . Coronary artery disease   . High cholesterol   . History of blood transfusion 1943  . Hypertension   . Migraine    "none since the 1990s" (09/11/2016)  . Myocardial infarction ([place]) 01/20/1993; 02/28/1993  . Pneumonia    "several times" (09/11/2016)  . Tobacco abuse     Tobacco History: Social History   Tobacco Use  Smoking Status Current Every Day Smoker  . Packs/day: 1.00  . Years: 70.00  . Pack years:  70.00  . Types: Cigarettes  Smokeless Tobacco Never Used   Ready to quit: Yes Counseling given: Yes   Outpatient Encounter Medications as of 12/09/2017  Medication Sig  . ADVAIR HFA 115-21 MCG/ACT inhaler Inhale 2 puffs into the lungs every 12 (twelve) hours.  [name] amLODipine (NORVASC) 5 MG tablet TAKE 1 TABLET BY MOUTH EVERY DAY (Patient taking differently: Take 5 mg by mouth daily. )  . Ascorbic Acid (VITAMIN C) 500 MG CAPS Take 500 mg by mouth daily.  [name]  aspirin EC 81 MG tablet Take 1 tablet by mouth daily.  . celecoxib (CELEBREX) 200 MG capsule Take 1 capsule (200 mg total) by mouth daily.  . cyclobenzaprine (FLEXERIL) 5 MG tablet Take 5 mg by mouth 2 (two) times daily.   . Ferrous Sulfate (IRON) 325 (65 Fe) MG TABS Take 1 tablet by mouth daily.  [name] gabapentin (NEURONTIN) 300 MG capsule Take 1 capsule by mouth 3 (three) times daily.  [name] lisinopril (PRINIVIL,ZESTRIL) 20 MG tablet Take 1 tablet (20 mg total) by mouth daily.  . metoCLOPramide (REGLAN) 5 MG tablet Take 1 tablet by mouth 4 (four) times daily -  before meals and at bedtime.  . metoprolol succinate (TOPROL-XL) 25 MG 24 hr tablet Take 25 mg by mouth daily.  . mirabegron ER (MYRBETRIQ) 25 MG TB24 tablet Take 25 mg by mouth daily.  . nitroGLYCERIN (NITROSTAT) 0.4 MG SL tablet Place 0.4 mg under the tongue every 5 (five) minutes as needed for chest pain.  [name] omeprazole (PRILOSEC) 40 MG capsule Take 40 mg by mouth daily.   . Oxycodone HCl 10 MG TABS Take 10 mg by mouth 4 (four) times daily.   . potassium gluconate (HM POTASSIUM) 595 (99 K) MG TABS tablet Take 595 mg by mouth daily.  . simvastatin (ZOCOR) 10 MG tablet Take 10 mg by mouth daily.  [name] triamcinolone cream (KENALOG) 0.5 % APPLY TOPICAL TO AFFECTED AREA 2 TIMES A DAY AS NEEDED FOR RASH  . VENTOLIN HFA 108 (90 Base) MCG/ACT inhaler Inhale 2 puffs into the lungs every 4 (four) hours as needed for wheezing or shortness of breath.   [name] buPROPion (WELLBUTRIN SR) 150 MG 12 hr tablet Take 1 tablet (150 mg total) by mouth 2 (two) times daily. Take 1 tablet daily for 1 week, stop smoking, then take twice daily after first week.   No facility-administered encounter medications on file as of 12/09/2017.      Review of Systems  Review of Systems  Constitutional: Negative.   HENT: Negative.   Respiratory: Negative for cough and shortness of breath.   Cardiovascular: Negative.  Negative for chest pain, palpitations and leg swelling.    Gastrointestinal: Negative.   Musculoskeletal: Positive for arthralgias and myalgias.       Chronic pain  Allergic/Immunologic: Negative.   Neurological: Negative.   Psychiatric/Behavioral: Negative.        Physical Exam  BP 126/72 (BP Location: Left Arm, Patient Position: Sitting, Cuff Size: Normal)   Pulse 80   Ht 6\' 1"  (1.854 m)   Wt 153 lb 6.4 oz (69.6 kg)   SpO2 98%   BMI 20.24 kg/m   Wt Readings from Last 5 Encounters:  12/09/17 153 lb 6.4 oz (69.6 kg)  11/25/17 150 lb (68 kg)  08/10/17 150 lb (68 kg)  08/08/17 150 lb (68 kg)  08/01/17 148 lb (67.1 kg)     Physical Exam  Constitutional: He is oriented to person, place, and time. He appears  well-developed and well-nourished. No distress.  Cardiovascular: Normal rate and regular rhythm.  Pulmonary/Chest: Effort normal and breath sounds normal. No respiratory distress. He has no wheezes.  Musculoskeletal: He exhibits no edema.  Neurological: He is alert and oriented to person, place, and time.  Skin: Skin is warm and dry.  Psychiatric: He has a normal mood and affect.  Nursing note and vitals reviewed.   Imaging: Dg Chest 2 View  Result Date: 11/25/2017 CLINICAL DATA:  Chest pain EXAM: CHEST - 2 VIEW COMPARISON:  08/08/2017 FINDINGS: Chronic interstitial opacities throughout the lungs, likely chronic lung disease/fibrosis. Heart is normal size. No effusions or acute bony abnormality. 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 Electronically Signed   By: Rolm Baptise M.D.   On: 11/25/2017 15:58   Ct Head Wo Contrast  Result Date: 11/25/2017 CLINICAL DATA:  Chest pain for the past 3 days. Altered mental status. Recurrent syncope. EXAM: CT HEAD WITHOUT CONTRAST TECHNIQUE: Contiguous axial images were obtained from the base of the skull through the vertex without intravenous contrast. COMPARISON:  Maxillofacial CT dated 03/15/2016. FINDINGS:  Brain: Old right frontal lobe infarct. Mild-to-moderate diffuse enlargement of the ventricles and subarachnoid spaces. Moderate patchy white matter low density in both cerebral hemispheres. No intracranial hemorrhage, mass lesion or CT evidence of acute infarction. Vascular: No hyperdense vessel or unexpected calcification. Skull: Normal. Negative for fracture or focal lesion. Sinuses/Orbits: Status post bilateral cataract extraction. Mucosal thickening and fluid in the right maxillary sinus with mild improvement. Resolved opacification of the right sphenoid sinus. Significantly improved right ethmoid sinus mucosal thickening. Other: None. IMPRESSION: 1. No acute abnormality. 2. Old right frontal lobe infarct. 3. Mild to moderate diffuse cerebral and cerebellar atrophy. 4. Moderate chronic small vessel white matter ischemic changes in both cerebral hemispheres. 5. Improved sinusitis. Electronically Signed   By: Claudie Revering M.D.   On: 11/25/2017 17:16   Ct Angio Chest Pe W Or Wo Contrast  Result Date: 11/25/2017 CLINICAL DATA:  Chest pain EXAM: CT ANGIOGRAPHY CHEST WITH CONTRAST TECHNIQUE: Multidetector CT imaging of the chest was performed using the standard protocol during bolus administration of intravenous contrast. Multiplanar CT image reconstructions and MIPs were obtained to evaluate the vascular anatomy. CONTRAST:  111mL ISOVUE-370 IOPAMIDOL (ISOVUE-370) INJECTION 76% COMPARISON:  Chest radiograph 11/25/2017 FINDINGS: Cardiovascular: --Pulmonary arteries: Contrast injection is sufficient to demonstrate satisfactory opacification of the pulmonary arteries to the segmental level. There is no pulmonary embolus. The main pulmonary artery is within normal limits for size. --Aorta: Limited opacification of the aorta due to bolus timing optimization for the pulmonary arteries. Conventional 3 vessel aortic branching pattern. The aortic course and caliber are normal. There is mild aortic atherosclerosis.  --Heart: Normal size. No pericardial effusion. Mediastinum/Nodes: No mediastinal, hilar or axillary lymphadenopathy. The visualized thyroid and thoracic esophageal course are unremarkable. Lungs/Pleura: Advanced emphysema. No pleural effusion or pneumothorax. There is central peribronchovascular thickening. There is a mass along the posterior aspect of the left lower lobe bronchus encasing multiple pulmonary artery branches and measuring approximately 3.8 x 2.9 cm. This is best demonstrated on sagittal image 104 (series 8). There may be an intrabronchial component within the LLL bronchus. Upper Abdomen: Contrast bolus timing is not optimized for evaluation of the abdominal organs. Within this limitation, the visualized organs of the upper abdomen are normal. Musculoskeletal: Old right tenth and eleventh rib fractures. Review of the MIP images confirms the above findings. IMPRESSION: 1. No pulmonary embolus. 2. Left lower  lobe peribronchial mass, abutting the posterior aspect of the left lower lobe bronchus and encasing segmental branches of the pulmonary arteries. 3. Extensive emphysema and/or interstitial lung disease. Aortic Atherosclerosis (ICD10-I70.0) and Emphysema (ICD10-J43.9). Electronically Signed   By: Ulyses Jarred M.D.   On: 11/25/2017 20:41   Nm Pet Image Initial (pi) Skull Base To Thigh  Result Date: 12/09/2017 CLINICAL DATA:  Initial treatment strategy for pulmonary nodule. EXAM: NUCLEAR MEDICINE PET SKULL BASE TO THIGH TECHNIQUE: 8.46 mCi F-18 FDG was injected intravenously. Full-ring PET imaging was performed from the skull base to thigh after the radiotracer. CT data was obtained and used for attenuation correction and anatomic localization. Fasting blood glucose: 97 mg/dl COMPARISON:  CT chest 11/25/2017 FINDINGS: Mediastinal blood pool activity: SUV max 1.62 NECK: No hypermetabolic lymph nodes in the neck. Incidental CT findings: none CHEST: The left lower lobe perihilar lung mass is again  noted. This measures approximately 3.6 cm within SUV max of 18.97. Bilateral several small nodules identified within the left upper lobe which are nonspecific and exhibit mild FDG uptake including a 6 mm peribronchovascular nodule within SUV max of 1.94, image 32/8. Also in the left upper lobe is a tiny nodule measuring 3 mm within SUV max of 1.16, image 25/8. 5 mm left upper lobe lung nodule is noted without significant uptake, image 21/8. Since 11/25/2017 there has been interval development of multi focal patchy nodular densities in the right upper lobe: -Within the periphery of the right upper lobe there is a subpleural nodular density measuring 1.6 cm within SUV max of 7.02, image 14/8. This is new when compared with 11/25/2017. -Adjacent nodule, also new measures 1.2 cm and has an SUV max of 4.15, image 14/8. -central right upper lobe nodule measures 2.4 cm and has an SUV max 7.55, image 26/8. -sub solid nodule within the central right upper lobe measures 1.8 cm and has an SUV max of 3.4, image 28/8. Low right paratracheal lymph node measures 1.2 cm and has an SUV max of 5.6. Right hilar lymph node has an SUV max of 4.69. Incidental CT findings: Advanced changes of emphysema. Aortic atherosclerosis noted. ABDOMEN/PELVIS: No abnormal hypermetabolic activity within the liver, pancreas, adrenal glands, or spleen. No hypermetabolic lymph nodes in the abdomen or pelvis. Incidental CT findings: Gallstone noted. Aortic atherosclerosis. Small right renal calculi identified SKELETON: No focal hypermetabolic activity to suggest skeletal metastasis. Incidental CT findings: Previous right hip arthroplasty. IMPRESSION: 1. The central left lower lobe perihilar lung mass exhibits intense FDG uptake and is concerning for primary bronchogenic carcinoma. Mild to moderate uptake is associated with the right paratracheal and right hilar lymph nodes. Cannot rule out metastatic adenopathy to these areas. 2. Since the exam from  11/25/2017 there is been interval development of multifocal patchy nodular densities within the right upper lobe. These exhibit moderate increased radiotracer uptake with SUV max up to 7.55. As these appear new from 11/25/2017 it is likely that these are postinflammatory/infectious in etiology. Unusual, aggressive metastatic disease is considered less favored, nevertheless follow-up imaging is advised to ensure complete resolution to rule out foci of metastasis versus synchronous primary neoplasms. 3. Aortic Atherosclerosis (ICD10-I70.0) and Emphysema (ICD10-J43.9). Electronically Signed   By: Kerby Moors M.D.   On: 12/09/2017 09:04     Assessment & Plan:   Emphysema, unspecified ([place]) Discussed PFT in office today Continue advair and ventolin as previously ordered     Mass of lower lobe of left lung Patient Instructions  Continue Advair Continue  pro air as needed for shortness of breath  Plan to quit smoking: Start Wellbutrin at 1 tablet a day for 7 days  Stop smoking after 1 week of treatment Then take 1 tablet of Wellbutrin in the morning and 1 tablet in the afternoon  Discussed PET scan and PFT in office:  EBUS on 12/15/17 at 7:30 am at [facility]  Will order sleep study  Follow up in 2 weeks with Dr. Elsworth Soho     Tobacco abuse Patient Instructions  Continue Advair Continue pro air as needed for shortness of breath  Plan to quit smoking: Start Wellbutrin at 1 tablet a day for 7 days  Stop smoking after 1 week of treatment Then take 1 tablet of Wellbutrin in the morning and 1 tablet in the afternoon  Discussed PET scan and PFT in office:  EBUS on 12/15/17 at 7:30 am at [facility]  Will order sleep study  Follow up in 2 weeks with Dr. Ane Payment, NP 12/10/2017

## 2017-12-09 NOTE — H&P (View-Only) (Signed)
@Patient  ID: Bradley [name], male    DOB: Mar 27, 1940, 77 y.o.   MRN: [id number]  Chief Complaint  Patient presents with  . Hospitalization Follow-up    Referring provider: Jani Gravel, MD  HPI 77 year old male current smoker with emphysema and lung mass followed by Dr. Elsworth Soho.   Tests: CT chest 11/25/17 - No pulmonary embolus. Left lower lobe peribronchial mass, abutting the posterior aspect of the left lower lobe bronchus and encasing segmental branches of the pulmonary arteries. Extensive emphysema and/or interstitial lung disease.  PET scan 12/08/17: The central left lower lobe perihilar lung mass exhibits intense FDG uptake and is concerning for primary bronchogenic carcinoma. Mild to moderate uptake is associated with the right paratracheal and right hilar lymph nodes. Cannot rule out metastatic adenopathy to these areas. Since the exam from 11/25/2017 there is been interval development of multifocal patchy nodular densities within the right upper lobe. These exhibit moderate increased radiotracer uptake with SUV max up to 7.55. As these appear new from 11/25/2017 it is likely that these are postinflammatory/infectious in etiology. Unusual, aggressive metastatic disease is considered less favored, nevertheless follow-up imaging is advised to ensure complete resolution to rule out foci of metastasis versus synchronous primary neoplasms.  PFT Results Latest Ref Rng & Units 12/09/2017  FVC-Pre L 4.38  FVC-Predicted Pre % 97  FVC-Post L 4.36  FVC-Predicted Post % 96  Pre FEV1/FVC % % 56  Post FEV1/FCV % % 59  FEV1-Pre L 2.47  FEV1-Predicted Pre % 76  FEV1-Post L 2.55  DLCO UNC% % 29  DLCO COR %Predicted % 38  TLC L 6.94  TLC % Predicted % 93  RV % Predicted % 100   OV 12/10/17 - Hospital follow up 11/25/17 - 11/28/17 Patient presents for hospital follow up. Patient originally presented to [facility] on 11/25/17 with complaints of chest pain, lightheadedness, dyspnea,  passing out, urinary frequency and dysuria. CTA was negative for PE, but showed left lower lobe peribronchial mass. He was then transferred to [place]. He was treated for UTI with IV antibiotics. He states that he has been doing well since discharge. He had a PET scan and had a PFT done this morning. He denies any shortness of breath, fever, or edema. He is followed by pain management for chronic pain from arthritis and chronic chest pain (has had complete cardiac work-up). He still smokes, but is ready to try to quit. He is requesting to try wellbutrin to try to help him quit.       Allergies  Allergen Reactions  . Aspirin     Regular asa     Immunization History  Administered Date(s) Administered  . Influenza, High Dose Seasonal PF 11/14/2016  . Influenza-Unspecified 11/03/2016  . Pneumococcal Polysaccharide-23 09/12/2016    Past Medical History:  Diagnosis Date  . Chronic lower back pain   . Contact with stonefish as cause of accidental injury 1954   "stung across my left hand in [place]; LUE weaker since"  . Coronary artery disease   . High cholesterol   . History of blood transfusion 1943  . Hypertension   . Migraine    "none since the 1990s" (09/11/2016)  . Myocardial infarction ([place]) 01/20/1993; 02/28/1993  . Pneumonia    "several times" (09/11/2016)  . Tobacco abuse     Tobacco History: Social History   Tobacco Use  Smoking Status Current Every Day Smoker  . Packs/day: 1.00  . Years: 70.00  . Pack years:  70.00  . Types: Cigarettes  Smokeless Tobacco Never Used   Ready to quit: Yes Counseling given: Yes   Outpatient Encounter Medications as of 12/09/2017  Medication Sig  . ADVAIR HFA 115-21 MCG/ACT inhaler Inhale 2 puffs into the lungs every 12 (twelve) hours.  [name] amLODipine (NORVASC) 5 MG tablet TAKE 1 TABLET BY MOUTH EVERY DAY (Patient taking differently: Take 5 mg by mouth daily. )  . Ascorbic Acid (VITAMIN C) 500 MG CAPS Take 500 mg by mouth daily.  [name]  aspirin EC 81 MG tablet Take 1 tablet by mouth daily.  . celecoxib (CELEBREX) 200 MG capsule Take 1 capsule (200 mg total) by mouth daily.  . cyclobenzaprine (FLEXERIL) 5 MG tablet Take 5 mg by mouth 2 (two) times daily.   . Ferrous Sulfate (IRON) 325 (65 Fe) MG TABS Take 1 tablet by mouth daily.  [name] gabapentin (NEURONTIN) 300 MG capsule Take 1 capsule by mouth 3 (three) times daily.  [name] lisinopril (PRINIVIL,ZESTRIL) 20 MG tablet Take 1 tablet (20 mg total) by mouth daily.  . metoCLOPramide (REGLAN) 5 MG tablet Take 1 tablet by mouth 4 (four) times daily -  before meals and at bedtime.  . metoprolol succinate (TOPROL-XL) 25 MG 24 hr tablet Take 25 mg by mouth daily.  . mirabegron ER (MYRBETRIQ) 25 MG TB24 tablet Take 25 mg by mouth daily.  . nitroGLYCERIN (NITROSTAT) 0.4 MG SL tablet Place 0.4 mg under the tongue every 5 (five) minutes as needed for chest pain.  [name] omeprazole (PRILOSEC) 40 MG capsule Take 40 mg by mouth daily.   . Oxycodone HCl 10 MG TABS Take 10 mg by mouth 4 (four) times daily.   . potassium gluconate (HM POTASSIUM) 595 (99 K) MG TABS tablet Take 595 mg by mouth daily.  . simvastatin (ZOCOR) 10 MG tablet Take 10 mg by mouth daily.  [name] triamcinolone cream (KENALOG) 0.5 % APPLY TOPICAL TO AFFECTED AREA 2 TIMES A DAY AS NEEDED FOR RASH  . VENTOLIN HFA 108 (90 Base) MCG/ACT inhaler Inhale 2 puffs into the lungs every 4 (four) hours as needed for wheezing or shortness of breath.   [name] buPROPion (WELLBUTRIN SR) 150 MG 12 hr tablet Take 1 tablet (150 mg total) by mouth 2 (two) times daily. Take 1 tablet daily for 1 week, stop smoking, then take twice daily after first week.   No facility-administered encounter medications on file as of 12/09/2017.      Review of Systems  Review of Systems  Constitutional: Negative.   HENT: Negative.   Respiratory: Negative for cough and shortness of breath.   Cardiovascular: Negative.  Negative for chest pain, palpitations and leg swelling.    Gastrointestinal: Negative.   Musculoskeletal: Positive for arthralgias and myalgias.       Chronic pain  Allergic/Immunologic: Negative.   Neurological: Negative.   Psychiatric/Behavioral: Negative.        Physical Exam  BP 126/72 (BP Location: Left Arm, Patient Position: Sitting, Cuff Size: Normal)   Pulse 80   Ht 6\' 1"  (1.854 m)   Wt 153 lb 6.4 oz (69.6 kg)   SpO2 98%   BMI 20.24 kg/m   Wt Readings from Last 5 Encounters:  12/09/17 153 lb 6.4 oz (69.6 kg)  11/25/17 150 lb (68 kg)  08/10/17 150 lb (68 kg)  08/08/17 150 lb (68 kg)  08/01/17 148 lb (67.1 kg)     Physical Exam  Constitutional: He is oriented to person, place, and time. He appears  well-developed and well-nourished. No distress.  Cardiovascular: Normal rate and regular rhythm.  Pulmonary/Chest: Effort normal and breath sounds normal. No respiratory distress. He has no wheezes.  Musculoskeletal: He exhibits no edema.  Neurological: He is alert and oriented to person, place, and time.  Skin: Skin is warm and dry.  Psychiatric: He has a normal mood and affect.  Nursing note and vitals reviewed.   Imaging: Dg Chest 2 View  Result Date: 11/25/2017 CLINICAL DATA:  Chest pain EXAM: CHEST - 2 VIEW COMPARISON:  08/08/2017 FINDINGS: Chronic interstitial opacities throughout the lungs, likely chronic lung disease/fibrosis. Heart is normal size. No effusions or acute bony abnormality. 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 Electronically Signed   By: Rolm Baptise M.D.   On: 11/25/2017 15:58   Ct Head Wo Contrast  Result Date: 11/25/2017 CLINICAL DATA:  Chest pain for the past 3 days. Altered mental status. Recurrent syncope. EXAM: CT HEAD WITHOUT CONTRAST TECHNIQUE: Contiguous axial images were obtained from the base of the skull through the vertex without intravenous contrast. COMPARISON:  Maxillofacial CT dated 03/15/2016. FINDINGS:  Brain: Old right frontal lobe infarct. Mild-to-moderate diffuse enlargement of the ventricles and subarachnoid spaces. Moderate patchy white matter low density in both cerebral hemispheres. No intracranial hemorrhage, mass lesion or CT evidence of acute infarction. Vascular: No hyperdense vessel or unexpected calcification. Skull: Normal. Negative for fracture or focal lesion. Sinuses/Orbits: Status post bilateral cataract extraction. Mucosal thickening and fluid in the right maxillary sinus with mild improvement. Resolved opacification of the right sphenoid sinus. Significantly improved right ethmoid sinus mucosal thickening. Other: None. IMPRESSION: 1. No acute abnormality. 2. Old right frontal lobe infarct. 3. Mild to moderate diffuse cerebral and cerebellar atrophy. 4. Moderate chronic small vessel white matter ischemic changes in both cerebral hemispheres. 5. Improved sinusitis. Electronically Signed   By: Claudie Revering M.D.   On: 11/25/2017 17:16   Ct Angio Chest Pe W Or Wo Contrast  Result Date: 11/25/2017 CLINICAL DATA:  Chest pain EXAM: CT ANGIOGRAPHY CHEST WITH CONTRAST TECHNIQUE: Multidetector CT imaging of the chest was performed using the standard protocol during bolus administration of intravenous contrast. Multiplanar CT image reconstructions and MIPs were obtained to evaluate the vascular anatomy. CONTRAST:  179mL ISOVUE-370 IOPAMIDOL (ISOVUE-370) INJECTION 76% COMPARISON:  Chest radiograph 11/25/2017 FINDINGS: Cardiovascular: --Pulmonary arteries: Contrast injection is sufficient to demonstrate satisfactory opacification of the pulmonary arteries to the segmental level. There is no pulmonary embolus. The main pulmonary artery is within normal limits for size. --Aorta: Limited opacification of the aorta due to bolus timing optimization for the pulmonary arteries. Conventional 3 vessel aortic branching pattern. The aortic course and caliber are normal. There is mild aortic atherosclerosis.  --Heart: Normal size. No pericardial effusion. Mediastinum/Nodes: No mediastinal, hilar or axillary lymphadenopathy. The visualized thyroid and thoracic esophageal course are unremarkable. Lungs/Pleura: Advanced emphysema. No pleural effusion or pneumothorax. There is central peribronchovascular thickening. There is a mass along the posterior aspect of the left lower lobe bronchus encasing multiple pulmonary artery branches and measuring approximately 3.8 x 2.9 cm. This is best demonstrated on sagittal image 104 (series 8). There may be an intrabronchial component within the LLL bronchus. Upper Abdomen: Contrast bolus timing is not optimized for evaluation of the abdominal organs. Within this limitation, the visualized organs of the upper abdomen are normal. Musculoskeletal: Old right tenth and eleventh rib fractures. Review of the MIP images confirms the above findings. IMPRESSION: 1. No pulmonary embolus. 2. Left lower  lobe peribronchial mass, abutting the posterior aspect of the left lower lobe bronchus and encasing segmental branches of the pulmonary arteries. 3. Extensive emphysema and/or interstitial lung disease. Aortic Atherosclerosis (ICD10-I70.0) and Emphysema (ICD10-J43.9). Electronically Signed   By: Ulyses Jarred M.D.   On: 11/25/2017 20:41   Nm Pet Image Initial (pi) Skull Base To Thigh  Result Date: 12/09/2017 CLINICAL DATA:  Initial treatment strategy for pulmonary nodule. EXAM: NUCLEAR MEDICINE PET SKULL BASE TO THIGH TECHNIQUE: 8.46 mCi F-18 FDG was injected intravenously. Full-ring PET imaging was performed from the skull base to thigh after the radiotracer. CT data was obtained and used for attenuation correction and anatomic localization. Fasting blood glucose: 97 mg/dl COMPARISON:  CT chest 11/25/2017 FINDINGS: Mediastinal blood pool activity: SUV max 1.62 NECK: No hypermetabolic lymph nodes in the neck. Incidental CT findings: none CHEST: The left lower lobe perihilar lung mass is again  noted. This measures approximately 3.6 cm within SUV max of 18.97. Bilateral several small nodules identified within the left upper lobe which are nonspecific and exhibit mild FDG uptake including a 6 mm peribronchovascular nodule within SUV max of 1.94, image 32/8. Also in the left upper lobe is a tiny nodule measuring 3 mm within SUV max of 1.16, image 25/8. 5 mm left upper lobe lung nodule is noted without significant uptake, image 21/8. Since 11/25/2017 there has been interval development of multi focal patchy nodular densities in the right upper lobe: -Within the periphery of the right upper lobe there is a subpleural nodular density measuring 1.6 cm within SUV max of 7.02, image 14/8. This is new when compared with 11/25/2017. -Adjacent nodule, also new measures 1.2 cm and has an SUV max of 4.15, image 14/8. -central right upper lobe nodule measures 2.4 cm and has an SUV max 7.55, image 26/8. -sub solid nodule within the central right upper lobe measures 1.8 cm and has an SUV max of 3.4, image 28/8. Low right paratracheal lymph node measures 1.2 cm and has an SUV max of 5.6. Right hilar lymph node has an SUV max of 4.69. Incidental CT findings: Advanced changes of emphysema. Aortic atherosclerosis noted. ABDOMEN/PELVIS: No abnormal hypermetabolic activity within the liver, pancreas, adrenal glands, or spleen. No hypermetabolic lymph nodes in the abdomen or pelvis. Incidental CT findings: Gallstone noted. Aortic atherosclerosis. Small right renal calculi identified SKELETON: No focal hypermetabolic activity to suggest skeletal metastasis. Incidental CT findings: Previous right hip arthroplasty. IMPRESSION: 1. The central left lower lobe perihilar lung mass exhibits intense FDG uptake and is concerning for primary bronchogenic carcinoma. Mild to moderate uptake is associated with the right paratracheal and right hilar lymph nodes. Cannot rule out metastatic adenopathy to these areas. 2. Since the exam from  11/25/2017 there is been interval development of multifocal patchy nodular densities within the right upper lobe. These exhibit moderate increased radiotracer uptake with SUV max up to 7.55. As these appear new from 11/25/2017 it is likely that these are postinflammatory/infectious in etiology. Unusual, aggressive metastatic disease is considered less favored, nevertheless follow-up imaging is advised to ensure complete resolution to rule out foci of metastasis versus synchronous primary neoplasms. 3. Aortic Atherosclerosis (ICD10-I70.0) and Emphysema (ICD10-J43.9). Electronically Signed   By: Kerby Moors M.D.   On: 12/09/2017 09:04     Assessment & Plan:   Emphysema, unspecified ([place]) Discussed PFT in office today Continue advair and ventolin as previously ordered     Mass of lower lobe of left lung Patient Instructions  Continue Advair Continue  pro air as needed for shortness of breath  Plan to quit smoking: Start Wellbutrin at 1 tablet a day for 7 days  Stop smoking after 1 week of treatment Then take 1 tablet of Wellbutrin in the morning and 1 tablet in the afternoon  Discussed PET scan and PFT in office:  EBUS on 12/15/17 at 7:30 am at [facility]  Will order sleep study  Follow up in 2 weeks with Dr. Elsworth Soho     Tobacco abuse Patient Instructions  Continue Advair Continue pro air as needed for shortness of breath  Plan to quit smoking: Start Wellbutrin at 1 tablet a day for 7 days  Stop smoking after 1 week of treatment Then take 1 tablet of Wellbutrin in the morning and 1 tablet in the afternoon  Discussed PET scan and PFT in office:  EBUS on 12/15/17 at 7:30 am at [facility]  Will order sleep study  Follow up in 2 weeks with Dr. Ane Payment, NP 12/10/2017

## 2017-12-10 ENCOUNTER — Encounter: Payer: Self-pay | Admitting: Nurse Practitioner

## 2017-12-10 DIAGNOSIS — R918 Other nonspecific abnormal finding of lung field: Secondary | ICD-10-CM | POA: Insufficient documentation

## 2017-12-10 NOTE — Assessment & Plan Note (Signed)
Patient Instructions  Continue Advair Continue pro air as needed for shortness of breath  Plan to quit smoking: Start Wellbutrin at 1 tablet a day for 7 days  Stop smoking after 1 week of treatment Then take 1 tablet of Wellbutrin in the morning and 1 tablet in the afternoon  Discussed PET scan and PFT in office:  EBUS on 12/15/17 at 7:30 am at [facility]  Will order sleep study  Follow up in 2 weeks with Dr. Elsworth Soho

## 2017-12-10 NOTE — Assessment & Plan Note (Signed)
Patient Instructions  Continue Advair Continue pro air as needed for shortness of breath  Plan to quit smoking: Start Wellbutrin at 1 tablet a day for 7 days  Stop smoking after 1 week of treatment Then take 1 tablet of Wellbutrin in the morning and 1 tablet in the afternoon  Discussed PET scan and PFT in office:  EBUS on 12/15/17 at 7:30 am at [facility]  Will order sleep study  Follow up in 2 weeks with Dr. Elsworth Soho

## 2017-12-10 NOTE — Assessment & Plan Note (Signed)
Discussed PFT in office today Continue advair and ventolin as previously ordered

## 2017-12-11 DIAGNOSIS — R05 Cough: Secondary | ICD-10-CM | POA: Diagnosis not present

## 2017-12-11 DIAGNOSIS — J449 Chronic obstructive pulmonary disease, unspecified: Secondary | ICD-10-CM | POA: Diagnosis not present

## 2017-12-11 DIAGNOSIS — J309 Allergic rhinitis, unspecified: Secondary | ICD-10-CM | POA: Diagnosis not present

## 2017-12-14 ENCOUNTER — Encounter (HOSPITAL_COMMUNITY): Payer: Self-pay | Admitting: *Deleted

## 2017-12-14 ENCOUNTER — Other Ambulatory Visit: Payer: Self-pay

## 2017-12-14 NOTE — Progress Notes (Signed)
Bradley [name] denies chest pain or shortness of breath. I instructed patient to hold Celebrax.

## 2017-12-15 ENCOUNTER — Ambulatory Visit (HOSPITAL_COMMUNITY): Payer: Medicare Other | Admitting: Certified Registered"

## 2017-12-15 ENCOUNTER — Encounter (HOSPITAL_COMMUNITY): Payer: Self-pay | Admitting: *Deleted

## 2017-12-15 ENCOUNTER — Encounter (HOSPITAL_COMMUNITY)
Admission: RE | Disposition: A | Discharge status: Home / Self Care | Payer: Self-pay | Source: Ambulatory Visit | Attending: Pulmonary Disease

## 2017-12-15 ENCOUNTER — Ambulatory Visit (HOSPITAL_COMMUNITY)
Admission: RE | Admit: 2017-12-15 | Discharge: 2017-12-15 | Disposition: A | Discharge status: Home / Self Care | Payer: Medicare Other | Source: Ambulatory Visit | Attending: Pulmonary Disease | Admitting: Pulmonary Disease

## 2017-12-15 DIAGNOSIS — Z79899 Other long term (current) drug therapy: Secondary | ICD-10-CM | POA: Insufficient documentation

## 2017-12-15 DIAGNOSIS — J329 Chronic sinusitis, unspecified: Secondary | ICD-10-CM | POA: Insufficient documentation

## 2017-12-15 DIAGNOSIS — I2510 Atherosclerotic heart disease of native coronary artery without angina pectoris: Secondary | ICD-10-CM | POA: Insufficient documentation

## 2017-12-15 DIAGNOSIS — I252 Old myocardial infarction: Secondary | ICD-10-CM | POA: Insufficient documentation

## 2017-12-15 DIAGNOSIS — Z7982 Long term (current) use of aspirin: Secondary | ICD-10-CM | POA: Insufficient documentation

## 2017-12-15 DIAGNOSIS — I10 Essential (primary) hypertension: Secondary | ICD-10-CM | POA: Insufficient documentation

## 2017-12-15 DIAGNOSIS — Z8673 Personal history of transient ischemic attack (TIA), and cerebral infarction without residual deficits: Secondary | ICD-10-CM | POA: Insufficient documentation

## 2017-12-15 DIAGNOSIS — F17210 Nicotine dependence, cigarettes, uncomplicated: Secondary | ICD-10-CM | POA: Diagnosis not present

## 2017-12-15 DIAGNOSIS — E7800 Pure hypercholesterolemia, unspecified: Secondary | ICD-10-CM | POA: Diagnosis not present

## 2017-12-15 DIAGNOSIS — R918 Other nonspecific abnormal finding of lung field: Secondary | ICD-10-CM | POA: Diagnosis not present

## 2017-12-15 DIAGNOSIS — E785 Hyperlipidemia, unspecified: Secondary | ICD-10-CM | POA: Diagnosis not present

## 2017-12-15 DIAGNOSIS — C3432 Malignant neoplasm of lower lobe, left bronchus or lung: Secondary | ICD-10-CM | POA: Diagnosis not present

## 2017-12-15 DIAGNOSIS — Z96641 Presence of right artificial hip joint: Secondary | ICD-10-CM | POA: Diagnosis not present

## 2017-12-15 DIAGNOSIS — J439 Emphysema, unspecified: Secondary | ICD-10-CM | POA: Insufficient documentation

## 2017-12-15 DIAGNOSIS — C771 Secondary and unspecified malignant neoplasm of intrathoracic lymph nodes: Secondary | ICD-10-CM | POA: Diagnosis not present

## 2017-12-15 DIAGNOSIS — Z886 Allergy status to analgesic agent status: Secondary | ICD-10-CM | POA: Insufficient documentation

## 2017-12-15 DIAGNOSIS — I700 Atherosclerosis of aorta: Secondary | ICD-10-CM | POA: Diagnosis not present

## 2017-12-15 HISTORY — DX: Personal history of urinary calculi: Z87.442

## 2017-12-15 HISTORY — DX: Dyspnea, unspecified: R06.00

## 2017-12-15 HISTORY — PX: VIDEO BRONCHOSCOPY WITH ENDOBRONCHIAL ULTRASOUND: SHX6177

## 2017-12-15 HISTORY — DX: Myoneural disorder, unspecified: G70.9

## 2017-12-15 LAB — BASIC METABOLIC PANEL WITH GFR
Anion gap: 9 (ref 5–15)
BUN: 15 mg/dL (ref 8–23)
CO2: 23 mmol/L (ref 22–32)
Calcium: 8.9 mg/dL (ref 8.9–10.3)
Chloride: 106 mmol/L (ref 98–111)
Creatinine, Ser: 0.85 mg/dL (ref 0.61–1.24)
GFR calc Af Amer: >60 mL/min (ref >60)
GFR calc non Af Amer: >60 mL/min (ref >60)
Glucose, Bld: 99 mg/dL (ref 70–99)
Potassium: 3.8 mmol/L (ref 3.5–5.1)
Sodium: 138 mmol/L (ref 135–145)

## 2017-12-15 LAB — CBC
HCT: 46.5 % (ref 39.0–52.0)
Hemoglobin: 14.8 g/dL (ref 13.0–17.0)
MCH: 29.2 pg (ref 26.0–34.0)
MCHC: 31.8 g/dL (ref 30.0–36.0)
MCV: 91.7 fL (ref 80.0–100.0)
Platelets: 545 K/uL — ABNORMAL HIGH (ref 150–400)
RBC: 5.07 MIL/uL (ref 4.22–5.81)
RDW: 16.4 % — ABNORMAL HIGH (ref 11.5–15.5)
WBC: 14.2 K/uL — ABNORMAL HIGH (ref 4.0–10.5)
nRBC: 0 % (ref 0.0–0.2)

## 2017-12-15 SURGERY — BRONCHOSCOPY, WITH EBUS
Anesthesia: General | Site: Chest

## 2017-12-15 MED ORDER — FENTANYL CITRATE (PF) 100 MCG/2ML IJ SOLN
INTRAMUSCULAR | Status: DC | PRN
  Administered 2017-12-15 (×2): 50 ug via INTRAVENOUS

## 2017-12-15 MED ORDER — METOPROLOL SUCCINATE ER 25 MG PO TB24
ORAL_TABLET | ORAL | Status: AC
  Filled 2017-12-15: qty 1

## 2017-12-15 MED ORDER — ONDANSETRON HCL 4 MG/2ML IJ SOLN: 4.0000 mg | Freq: Once | INTRAMUSCULAR | Status: DC | PRN

## 2017-12-15 MED ORDER — LIDOCAINE 2% (20 MG/ML) 5 ML SYRINGE
INTRAMUSCULAR | Status: DC | PRN
  Administered 2017-12-15: 60 mg via INTRAVENOUS

## 2017-12-15 MED ORDER — FENTANYL CITRATE (PF) 100 MCG/2ML IJ SOLN: 25.0000 ug | INTRAMUSCULAR | Status: DC | PRN

## 2017-12-15 MED ORDER — ROCURONIUM BROMIDE 100 MG/10ML IV SOLN
INTRAVENOUS | Status: DC | PRN
  Administered 2017-12-15: 40 mg via INTRAVENOUS

## 2017-12-15 MED ORDER — MIDAZOLAM HCL 2 MG/2ML IJ SOLN
INTRAMUSCULAR | Status: AC
  Filled 2017-12-15: qty 2

## 2017-12-15 MED ORDER — ESMOLOL HCL 100 MG/10ML IV SOLN
INTRAVENOUS | Status: DC | PRN
  Administered 2017-12-15 (×7): 10 mg via INTRAVENOUS

## 2017-12-15 MED ORDER — METOPROLOL SUCCINATE ER 25 MG PO TB24
25.0000 mg | ORAL_TABLET | Freq: Once | ORAL | Status: AC
  Administered 2017-12-15: 25 mg via ORAL

## 2017-12-15 MED ORDER — DEXAMETHASONE SODIUM PHOSPHATE 10 MG/ML IJ SOLN
INTRAMUSCULAR | Status: DC | PRN
  Administered 2017-12-15: 10 mg via INTRAVENOUS

## 2017-12-15 MED ORDER — DEXMEDETOMIDINE HCL 200 MCG/2ML IV SOLN
INTRAVENOUS | Status: DC | PRN
  Administered 2017-12-15 (×2): 4 ug via INTRAVENOUS

## 2017-12-15 MED ORDER — LACTATED RINGERS IV SOLN
INTRAVENOUS | Status: DC
  Administered 2017-12-15: 10:00:00 via INTRAVENOUS

## 2017-12-15 MED ORDER — FENTANYL CITRATE (PF) 250 MCG/5ML IJ SOLN
INTRAMUSCULAR | Status: AC
  Filled 2017-12-15: qty 5

## 2017-12-15 MED ORDER — SUGAMMADEX SODIUM 200 MG/2ML IV SOLN
INTRAVENOUS | Status: DC | PRN
  Administered 2017-12-15: 100 mg via INTRAVENOUS

## 2017-12-15 MED ORDER — 0.9 % SODIUM CHLORIDE (POUR BTL) OPTIME
TOPICAL | Status: DC | PRN
  Administered 2017-12-15: 1000 mL

## 2017-12-15 MED ORDER — PROPOFOL 10 MG/ML IV BOLUS
INTRAVENOUS | Status: DC | PRN
  Administered 2017-12-15: 100 mg via INTRAVENOUS
  Administered 2017-12-15: 10 mg via INTRAVENOUS
  Administered 2017-12-15 (×3): 30 mg via INTRAVENOUS

## 2017-12-15 MED ORDER — ROCURONIUM BROMIDE 50 MG/5ML IV SOSY
PREFILLED_SYRINGE | INTRAVENOUS | Status: AC
  Filled 2017-12-15: qty 5

## 2017-12-15 MED ORDER — ONDANSETRON HCL 4 MG/2ML IJ SOLN
INTRAMUSCULAR | Status: DC | PRN
  Administered 2017-12-15: 4 mg via INTRAVENOUS

## 2017-12-15 MED ORDER — DEXMEDETOMIDINE HCL IN NACL 200 MCG/50ML IV SOLN
INTRAVENOUS | Status: AC
  Filled 2017-12-15: qty 100

## 2017-12-15 MED ORDER — SUGAMMADEX SODIUM 200 MG/2ML IV SOLN
INTRAVENOUS | Status: AC
  Filled 2017-12-15: qty 2

## 2017-12-15 SURGICAL SUPPLY — 29 items
ADAPTER VALVE BIOPSY EBUS (MISCELLANEOUS) IMPLANT
ADPTR VALVE BIOPSY EBUS (MISCELLANEOUS)
BRUSH CYTOL CELLEBRITY 1.5X140 (MISCELLANEOUS) ×2 IMPLANT
CANISTER SUCT 3000ML PPV (MISCELLANEOUS) ×2 IMPLANT
CONT SPEC 4OZ CLIKSEAL STRL BL (MISCELLANEOUS) ×2 IMPLANT
COVER BACK TABLE 60X90IN (DRAPES) ×2 IMPLANT
FORCEPS BIOP RJ4 1.8 (CUTTING FORCEPS) IMPLANT
FORCEPS RADIAL JAW LRG 4 PULM (INSTRUMENTS) ×1 IMPLANT
GAUZE SPONGE 4X4 12PLY STRL (GAUZE/BANDAGES/DRESSINGS) IMPLANT
GLOVE SURG SIGNA 7.5 PF LTX (GLOVE) ×2 IMPLANT
KIT CLEAN ENDO COMPLIANCE (KITS) ×4 IMPLANT
KIT TURNOVER KIT B (KITS) ×2 IMPLANT
MARKER SKIN DUAL TIP RULER LAB (MISCELLANEOUS) ×2 IMPLANT
NEEDLE ASPIRATION VIZISHOT 19G (NEEDLE) IMPLANT
NEEDLE ASPIRATION VIZISHOT 21G (NEEDLE) ×2 IMPLANT
NS IRRIG 1000ML POUR BTL (IV SOLUTION) ×2 IMPLANT
OIL SILICONE PENTAX (PARTS (SERVICE/REPAIRS)) IMPLANT
PAD ARMBOARD 7.5X6 YLW CONV (MISCELLANEOUS) ×4 IMPLANT
RADIAL JAW LRG 4 PULMONARY (INSTRUMENTS) ×1
SYR 20CC LL (SYRINGE) ×2 IMPLANT
SYR 20ML ECCENTRIC (SYRINGE) ×4 IMPLANT
SYR 5ML LUER SLIP (SYRINGE) ×2 IMPLANT
TOWEL OR 17X24 6PK STRL BLUE (TOWEL DISPOSABLE) ×2 IMPLANT
TRAP SPECIMEN MUCOUS 40CC (MISCELLANEOUS) IMPLANT
TUBE CONNECTING 20X1/4 (TUBING) ×2 IMPLANT
VALVE BIOPSY  SINGLE USE (MISCELLANEOUS) ×1
VALVE BIOPSY SINGLE USE (MISCELLANEOUS) ×1 IMPLANT
VALVE SUCTION BRONCHIO DISP (MISCELLANEOUS) ×2 IMPLANT
WATER STERILE IRR 1000ML POUR (IV SOLUTION) ×2 IMPLANT

## 2017-12-15 NOTE — Anesthesia Preprocedure Evaluation (Addendum)
Anesthesia Evaluation  Patient identified by MRN, date of birth, ID band Patient awake    Reviewed: Allergy & Precautions, NPO status , Patient's Chart, lab work & pertinent test results  Airway Mallampati: II  TM Distance: >3 FB Neck ROM: Full    Dental  (+) Edentulous Upper, Edentulous Lower   Pulmonary COPD,  COPD inhaler, Current Smoker,    Pulmonary exam normal breath sounds clear to auscultation       Cardiovascular hypertension, Pt. on medications and Pt. on home beta blockers + CAD and + Past MI  Normal cardiovascular exam Rhythm:Regular Rate:Normal  ECG: SR, LAD, rate 85  ECHO: LV EF: 55% -   60%  Sees cardiologist   Neuro/Psych  Headaches, negative psych ROS   GI/Hepatic negative GI ROS, Neg liver ROS,   Endo/Other  negative endocrine ROS  Renal/GU negative Renal ROS     Musculoskeletal Chronic lower back pain   Abdominal   Peds  Hematology HLD   Anesthesia Other Findings lung mass  Reproductive/Obstetrics                            Anesthesia Physical Anesthesia Plan  ASA: III  Anesthesia Plan: General   Post-op Pain Management:    Induction: Intravenous  PONV Risk Score and Plan: 1 and Ondansetron, Dexamethasone and Treatment may vary due to age or medical condition  Airway Management Planned: Oral ETT  Additional Equipment:   Intra-op Plan:   Post-operative Plan: Extubation in OR  Informed Consent: I have reviewed the patients History and Physical, chart, labs and discussed the procedure including the risks, benefits and alternatives for the proposed anesthesia with the patient or authorized representative who has indicated his/her understanding and acceptance.   Dental advisory given  Plan Discussed with: CRNA  Anesthesia Plan Comments:         Anesthesia Quick Evaluation

## 2017-12-15 NOTE — Op Note (Signed)
  Name:  Soham Hollett MRN:  [id number] DOB:  18-Jul-1940  PROCEDURE NOTE  Procedure(s): Flexible bronchoscopy [phone]) Brushing [phone]) of the LLL Bronchial alveolar lavage (11552) of the LLL Endobronchial biopsy (08022) of the LLL sup segment lesion Endobronchial ultrasound (33612) Transbronchial needle aspiration (24497) of the 10 L station   Indications: Left lung mass  Consent:  Written informed consent was obtained prior to the procedure. The risks of the procedure including coughing, bleeding and the small chance of lung puncture requiring chest tube were discussed in great detail. The benefits & alternatives including serial follow up were also discussed.  Anesthesia:  General endotracheal.  Procedure summary:  Appropriate equipment was assembled.  The patient was  identified as [name]. Interim history obtained and brought to the operating room. Safety timeout was performed. The patient was placed supine on the operating table, airway established and general anesthesia administered by Anesthesia team.   After the appropriate level of anesthesia was assured, flexible video bronchoscope was lubricated and inserted through the endotracheal tube.    Airway examination was performed bilaterally to subsegmental level.  Minimal clear secretions were noted, mucosa appeared normal and  endobronchial lesion identified at superior segmentof left lower lobe.  Endobronchial ultrasound video bronchoscope was then lubricated and inserted through the endotracheal tube. Surveillance of the mediastinal and and bilateral hilar lymph node stations was performed.  Pathologically enlarged lymph nodes/mass was noted at station 10L  Endobronchial ultrasound guided transbronchial needle aspiration of 10 L  (passes x5)  Flexible video bronchoscope was used again to perform brushings & endobronchial mucosal biopsies of lesion at superior segment of LLL   After ensuring hemostasis , the  bronchoscope was withdrawn.  The patient was extubated in operating room and transferred to PACU.   Specimens sent: [place] LLL Bronchial alveolar lavage specimen of the LLL for  microbiology and cytology. Endobronchial biopsy for path TBNA from !0L for cytology  Complications:  No immediate complications were noted.  Hemodynamic parameters and oxygenation remained stable throughout the procedure.  Estimated blood loss:  Less then 5 mL.   Kara Mead MD. [place] Flood. [facility] Pager [phone] If no response call [phone]   12/15/2017 1:46 PM

## 2017-12-15 NOTE — Interval H&P Note (Signed)
History and Physical Interval Note:  12/15/2017 12:11 PM  Bradley [name]  has presented today for surgery, with the diagnosis of lung mass  The various methods of treatment have been discussed with the patient and family. After consideration of risks, benefits and other options for treatment, the patient has consented to  Procedure(s): [place] (N/A) as a surgical intervention .  The patient's history has been reviewed, patient examined, no change in status, stable for surgery.  I have reviewed the patient's chart and labs.  Questions were answered to the patient's satisfaction.     Leanna Sato Elsworth Soho

## 2017-12-15 NOTE — Transfer of Care (Signed)
Immediate Anesthesia Transfer of Care Note  Patient: Bradley [name]  Procedure(s) Performed: VIDEO BRONCHOSCOPY WITH ENDOBRONCHIAL ULTRASOUND (N/A Chest)  Patient Location: PACU  Anesthesia Type:General  Level of Consciousness: drowsy and patient cooperative  Airway & Oxygen Therapy: Patient Spontanous Breathing and Patient connected to face mask oxygen  Post-op Assessment: Report given to RN and Post -op Vital signs reviewed and stable  Post vital signs: Reviewed and stable  Last Vitals:  Vitals Value Taken Time  BP    Temp    Pulse 80 12/15/2017  1:56 PM  Resp 18 12/15/2017  1:56 PM  SpO2 96 % 12/15/2017  1:56 PM  Vitals shown include unvalidated device data.  Last Pain:  Vitals:   12/15/17 0940  PainSc: 6       Patients Stated Pain Goal: 8 (70/92/95 7473)  Complications: No apparent anesthesia complications

## 2017-12-15 NOTE — Anesthesia Postprocedure Evaluation (Signed)
Anesthesia Post Note  Patient: Bradley [name]  Procedure(s) Performed: VIDEO BRONCHOSCOPY WITH ENDOBRONCHIAL ULTRASOUND (N/A Chest)     Patient location during evaluation: PACU Anesthesia Type: General Level of consciousness: awake and alert Pain management: pain level controlled Vital Signs Assessment: post-procedure vital signs reviewed and stable Respiratory status: spontaneous breathing, nonlabored ventilation, respiratory function stable and patient connected to nasal cannula oxygen Cardiovascular status: blood pressure returned to baseline and stable Postop Assessment: no apparent nausea or vomiting Anesthetic complications: no    Last Vitals:  Vitals:   12/15/17 1408 12/15/17 1422  BP: 99/66 107/60  Pulse: 79 79  Resp: (!) 26   Temp:    SpO2: 95% 96%    Last Pain:  Vitals:   12/15/17 1422  PainSc: 0-No pain                  P 

## 2017-12-15 NOTE — Anesthesia Procedure Notes (Signed)
Procedure Name: Intubation Date/Time: 12/15/2017 12:27 PM Performed by: Gwyndolyn Saxon, CRNA Pre-anesthesia Checklist: Patient identified, Emergency Drugs available, Suction available and Patient being monitored Patient Re-evaluated:Patient Re-evaluated prior to induction Oxygen Delivery Method: Circle system utilized Preoxygenation: Pre-oxygenation with 100% oxygen Induction Type: IV induction Ventilation: Mask ventilation without difficulty and Oral airway inserted - appropriate to patient size Laryngoscope Size: Sabra Heck and 2 Grade View: Grade I Tube type: Oral Tube size: 9.0 mm Number of attempts: 1 Airway Equipment and Method: Stylet Placement Confirmation: ETT inserted through vocal cords under direct vision,  positive ETCO2 and breath sounds checked- equal and bilateral Secured at: 22 cm Tube secured with: Tape Dental Injury: Teeth and Oropharynx as per pre-operative assessment  Comments: SRNA attempted DL X1.

## 2017-12-15 NOTE — Progress Notes (Signed)
Independently examined pt, evaluated data & formulated above care plan with NP   I discussed PET scan results with pt & daughter The various options of biopsy including bronchoscopy, CT guided needle aspiration and surgical biopsy were discussed.The risks of each procedure including coughing, bleeding and the  chances of lung puncture requiring chest tube were discussed in great detail. The benefits & alternatives including serial follow up were also discussed. Will proceed with EBUS/ bronchoscopy under general anesthesia  Rakesh V. Elsworth Soho MD

## 2017-12-16 ENCOUNTER — Encounter (HOSPITAL_COMMUNITY): Payer: Self-pay | Admitting: Pulmonary Disease

## 2017-12-16 LAB — ACID FAST SMEAR (AFB): ACID FAST SMEAR - AFSCU2: NEGATIVE

## 2017-12-17 LAB — CULTURE, RESPIRATORY W GRAM STAIN: Culture: NORMAL

## 2017-12-22 ENCOUNTER — Telehealth: Payer: Self-pay | Admitting: Pulmonary Disease

## 2017-12-22 DIAGNOSIS — R918 Other nonspecific abnormal finding of lung field: Secondary | ICD-10-CM

## 2017-12-22 NOTE — Telephone Encounter (Signed)
Spoke with patient. He was requesting the results of his biopsy that was done earlier this month. Advised patient that RA is away from the office and would be back next week to review his results. He verbalized understanding.   RA, please advise on results when you get a chance. Thanks!

## 2017-12-28 NOTE — Telephone Encounter (Signed)
Given biopsy results over the phone to patient and his daughter Proceed with  referral to oncology -Dr. Julien Nordmann

## 2017-12-28 NOTE — Telephone Encounter (Signed)
Patient calling back to see if RA has had a chance to review his biopsy, CB [phone]. States he is very anxious about this.

## 2017-12-28 NOTE — Telephone Encounter (Signed)
Order has been placed for the referral to oncology. Nothing further needed.

## 2017-12-29 ENCOUNTER — Emergency Department (HOSPITAL_COMMUNITY): Payer: Medicare Other

## 2017-12-29 ENCOUNTER — Other Ambulatory Visit: Payer: Self-pay

## 2017-12-29 ENCOUNTER — Inpatient Hospital Stay (HOSPITAL_COMMUNITY): Payer: Medicare Other

## 2017-12-29 ENCOUNTER — Encounter (HOSPITAL_COMMUNITY): Payer: Self-pay

## 2017-12-29 ENCOUNTER — Inpatient Hospital Stay (HOSPITAL_COMMUNITY)
Admission: EM | Admit: 2017-12-29 | Discharge: 2018-01-01 | DRG: 193 | Disposition: A | Discharge status: Skilled Nursing Facility | Payer: Medicare Other | Attending: Family Medicine | Admitting: Family Medicine

## 2017-12-29 DIAGNOSIS — Z7189 Other specified counseling: Secondary | ICD-10-CM

## 2017-12-29 DIAGNOSIS — W19XXXA Unspecified fall, initial encounter: Secondary | ICD-10-CM | POA: Diagnosis not present

## 2017-12-29 DIAGNOSIS — J440 Chronic obstructive pulmonary disease with acute lower respiratory infection: Secondary | ICD-10-CM | POA: Diagnosis not present

## 2017-12-29 DIAGNOSIS — R52 Pain, unspecified: Secondary | ICD-10-CM | POA: Diagnosis not present

## 2017-12-29 DIAGNOSIS — J439 Emphysema, unspecified: Secondary | ICD-10-CM | POA: Diagnosis not present

## 2017-12-29 DIAGNOSIS — E41 Nutritional marasmus: Secondary | ICD-10-CM | POA: Diagnosis not present

## 2017-12-29 DIAGNOSIS — C3490 Malignant neoplasm of unspecified part of unspecified bronchus or lung: Secondary | ICD-10-CM | POA: Diagnosis not present

## 2017-12-29 DIAGNOSIS — E785 Hyperlipidemia, unspecified: Secondary | ICD-10-CM | POA: Diagnosis not present

## 2017-12-29 DIAGNOSIS — D72829 Elevated white blood cell count, unspecified: Secondary | ICD-10-CM

## 2017-12-29 DIAGNOSIS — M6281 Muscle weakness (generalized): Secondary | ICD-10-CM | POA: Diagnosis not present

## 2017-12-29 DIAGNOSIS — Z7951 Long term (current) use of inhaled steroids: Secondary | ICD-10-CM | POA: Diagnosis not present

## 2017-12-29 DIAGNOSIS — Z79891 Long term (current) use of opiate analgesic: Secondary | ICD-10-CM

## 2017-12-29 DIAGNOSIS — F17210 Nicotine dependence, cigarettes, uncomplicated: Secondary | ICD-10-CM | POA: Diagnosis present

## 2017-12-29 DIAGNOSIS — Z7982 Long term (current) use of aspirin: Secondary | ICD-10-CM | POA: Diagnosis not present

## 2017-12-29 DIAGNOSIS — E7800 Pure hypercholesterolemia, unspecified: Secondary | ICD-10-CM | POA: Diagnosis present

## 2017-12-29 DIAGNOSIS — Z886 Allergy status to analgesic agent status: Secondary | ICD-10-CM | POA: Diagnosis not present

## 2017-12-29 DIAGNOSIS — R509 Fever, unspecified: Secondary | ICD-10-CM | POA: Diagnosis not present

## 2017-12-29 DIAGNOSIS — J0100 Acute maxillary sinusitis, unspecified: Secondary | ICD-10-CM | POA: Diagnosis present

## 2017-12-29 DIAGNOSIS — Z8673 Personal history of transient ischemic attack (TIA), and cerebral infarction without residual deficits: Secondary | ICD-10-CM

## 2017-12-29 DIAGNOSIS — Z87442 Personal history of urinary calculi: Secondary | ICD-10-CM

## 2017-12-29 DIAGNOSIS — R918 Other nonspecific abnormal finding of lung field: Secondary | ICD-10-CM

## 2017-12-29 DIAGNOSIS — I6389 Other cerebral infarction: Secondary | ICD-10-CM | POA: Diagnosis not present

## 2017-12-29 DIAGNOSIS — Z720 Tobacco use: Secondary | ICD-10-CM

## 2017-12-29 DIAGNOSIS — J438 Other emphysema: Secondary | ICD-10-CM | POA: Diagnosis not present

## 2017-12-29 DIAGNOSIS — C3492 Malignant neoplasm of unspecified part of left bronchus or lung: Secondary | ICD-10-CM | POA: Diagnosis not present

## 2017-12-29 DIAGNOSIS — I2510 Atherosclerotic heart disease of native coronary artery without angina pectoris: Secondary | ICD-10-CM | POA: Diagnosis not present

## 2017-12-29 DIAGNOSIS — Z515 Encounter for palliative care: Secondary | ICD-10-CM | POA: Diagnosis not present

## 2017-12-29 DIAGNOSIS — Z85118 Personal history of other malignant neoplasm of bronchus and lung: Secondary | ICD-10-CM

## 2017-12-29 DIAGNOSIS — Y95 Nosocomial condition: Secondary | ICD-10-CM | POA: Diagnosis present

## 2017-12-29 DIAGNOSIS — Z66 Do not resuscitate: Secondary | ICD-10-CM | POA: Diagnosis present

## 2017-12-29 DIAGNOSIS — R55 Syncope and collapse: Secondary | ICD-10-CM

## 2017-12-29 DIAGNOSIS — J449 Chronic obstructive pulmonary disease, unspecified: Secondary | ICD-10-CM | POA: Diagnosis present

## 2017-12-29 DIAGNOSIS — S199XXA Unspecified injury of neck, initial encounter: Secondary | ICD-10-CM | POA: Diagnosis not present

## 2017-12-29 DIAGNOSIS — G8929 Other chronic pain: Secondary | ICD-10-CM | POA: Diagnosis present

## 2017-12-29 DIAGNOSIS — R591 Generalized enlarged lymph nodes: Secondary | ICD-10-CM | POA: Diagnosis present

## 2017-12-29 DIAGNOSIS — Z96641 Presence of right artificial hip joint: Secondary | ICD-10-CM | POA: Diagnosis not present

## 2017-12-29 DIAGNOSIS — I252 Old myocardial infarction: Secondary | ICD-10-CM

## 2017-12-29 DIAGNOSIS — R531 Weakness: Secondary | ICD-10-CM | POA: Diagnosis not present

## 2017-12-29 DIAGNOSIS — R29898 Other symptoms and signs involving the musculoskeletal system: Secondary | ICD-10-CM | POA: Diagnosis not present

## 2017-12-29 DIAGNOSIS — Z79899 Other long term (current) drug therapy: Secondary | ICD-10-CM

## 2017-12-29 DIAGNOSIS — G629 Polyneuropathy, unspecified: Secondary | ICD-10-CM | POA: Diagnosis present

## 2017-12-29 DIAGNOSIS — Z96652 Presence of left artificial knee joint: Secondary | ICD-10-CM | POA: Diagnosis not present

## 2017-12-29 DIAGNOSIS — J189 Pneumonia, unspecified organism: Principal | ICD-10-CM | POA: Diagnosis present

## 2017-12-29 DIAGNOSIS — I10 Essential (primary) hypertension: Secondary | ICD-10-CM | POA: Diagnosis present

## 2017-12-29 DIAGNOSIS — C3432 Malignant neoplasm of lower lobe, left bronchus or lung: Secondary | ICD-10-CM | POA: Diagnosis present

## 2017-12-29 DIAGNOSIS — S0990XA Unspecified injury of head, initial encounter: Secondary | ICD-10-CM | POA: Diagnosis not present

## 2017-12-29 DIAGNOSIS — R1310 Dysphagia, unspecified: Secondary | ICD-10-CM | POA: Diagnosis not present

## 2017-12-29 DIAGNOSIS — Z6820 Body mass index (BMI) 20.0-20.9, adult: Secondary | ICD-10-CM

## 2017-12-29 DIAGNOSIS — R0902 Hypoxemia: Secondary | ICD-10-CM | POA: Diagnosis not present

## 2017-12-29 DIAGNOSIS — Z7401 Bed confinement status: Secondary | ICD-10-CM | POA: Diagnosis not present

## 2017-12-29 DIAGNOSIS — R262 Difficulty in walking, not elsewhere classified: Secondary | ICD-10-CM | POA: Diagnosis not present

## 2017-12-29 HISTORY — DX: Other chronic pain: G89.29

## 2017-12-29 HISTORY — DX: Chronic obstructive pulmonary disease, unspecified: J44.9

## 2017-12-29 HISTORY — DX: Other nonspecific abnormal finding of lung field: R91.8

## 2017-12-29 HISTORY — DX: Malignant (primary) neoplasm, unspecified: C80.1

## 2017-12-29 LAB — COMPREHENSIVE METABOLIC PANEL
ALK PHOS: 109 U/L (ref 38–126)
ALT: 12 U/L (ref 0–44)
ANION GAP: 8 (ref 5–15)
AST: 15 U/L (ref 15–41)
Albumin: 3.4 g/dL — ABNORMAL LOW (ref 3.5–5.0)
BUN: 20 mg/dL (ref 8–23)
CALCIUM: 8.6 mg/dL — AB (ref 8.9–10.3)
CO2: 22 mmol/L (ref 22–32)
Chloride: 105 mmol/L (ref 98–111)
Creatinine, Ser: 1.07 mg/dL (ref 0.61–1.24)
GFR calc non Af Amer: >60 mL/min (ref >60)
Glucose, Bld: 116 mg/dL — ABNORMAL HIGH (ref 70–99)
Potassium: 3.9 mmol/L (ref 3.5–5.1)
SODIUM: 135 mmol/L (ref 135–145)
TOTAL PROTEIN: 7.4 g/dL (ref 6.5–8.1)
Total Bilirubin: 1.2 mg/dL (ref 0.3–1.2)

## 2017-12-29 LAB — PROTIME-INR
INR: 1.07
Prothrombin Time: 13.8 seconds (ref 11.4–15.2)

## 2017-12-29 LAB — CBC WITH DIFFERENTIAL/PLATELET
Abs Immature Granulocytes: 0.43 10*3/uL — ABNORMAL HIGH (ref 0.00–0.07)
BASOS ABS: 0.2 10*3/uL — AB (ref 0.0–0.1)
Basophils Relative: 0 %
EOS PCT: 0 %
Eosinophils Absolute: 0 10*3/uL (ref 0.0–0.5)
HCT: 45.8 % (ref 39.0–52.0)
HEMOGLOBIN: 14.7 g/dL (ref 13.0–17.0)
Immature Granulocytes: 1 %
LYMPHS ABS: 2.3 10*3/uL (ref 0.7–4.0)
Lymphocytes Relative: 6 %
MCH: 29.5 pg (ref 26.0–34.0)
MCHC: 32.1 g/dL (ref 30.0–36.0)
MCV: 92 fL (ref 80.0–100.0)
Monocytes Absolute: 1.7 10*3/uL — ABNORMAL HIGH (ref 0.1–1.0)
Monocytes Relative: 4 %
NRBC: 0 % (ref 0.0–0.2)
Neutro Abs: 33.1 10*3/uL — ABNORMAL HIGH (ref 1.7–7.7)
Neutrophils Relative %: 89 %
Platelets: 435 10*3/uL — ABNORMAL HIGH (ref 150–400)
RBC: 4.98 MIL/uL (ref 4.22–5.81)
RDW: 17.1 % — ABNORMAL HIGH (ref 11.5–15.5)
WBC: 37.6 10*3/uL — ABNORMAL HIGH (ref 4.0–10.5)

## 2017-12-29 LAB — I-STAT CG4 LACTIC ACID, ED
Lactic Acid, Venous: 1.24 mmol/L (ref 0.5–1.9)
Lactic Acid, Venous: 0.76 mmol/L (ref 0.5–1.9)

## 2017-12-29 LAB — CK: CK TOTAL: 42 U/L — AB (ref 49–397)

## 2017-12-29 LAB — ETHANOL: Alcohol, Ethyl (B): <10 mg/dL (ref <10)

## 2017-12-29 LAB — TROPONIN I: Troponin I: 0.09 ng/mL (ref <0.03)

## 2017-12-29 MED ORDER — VANCOMYCIN HCL IN DEXTROSE 1-5 GM/200ML-% IV SOLN: 1000.0000 mg | Freq: Once | INTRAVENOUS | Status: DC

## 2017-12-29 MED ORDER — NITROGLYCERIN 0.4 MG SL SUBL: 0.4000 mg | SUBLINGUAL_TABLET | SUBLINGUAL | Status: DC | PRN

## 2017-12-29 MED ORDER — METRONIDAZOLE IN NACL 5-0.79 MG/ML-% IV SOLN
500.0000 mg | Freq: Three times a day (TID) | INTRAVENOUS | Status: DC
  Administered 2017-12-29: 500 mg via INTRAVENOUS
  Filled 2017-12-29: qty 100

## 2017-12-29 MED ORDER — SODIUM CHLORIDE 0.9 % IV BOLUS
1000.0000 mL | Freq: Once | INTRAVENOUS | Status: AC
  Administered 2017-12-29: 1000 mL via INTRAVENOUS

## 2017-12-29 MED ORDER — GABAPENTIN 300 MG PO CAPS
300.0000 mg | ORAL_CAPSULE | Freq: Three times a day (TID) | ORAL | Status: DC
  Administered 2017-12-29 – 2018-01-01 (×10): 300 mg via ORAL
  Filled 2017-12-29 (×10): qty 1

## 2017-12-29 MED ORDER — VITAMIN C 500 MG PO TABS
500.0000 mg | ORAL_TABLET | Freq: Every day | ORAL | Status: DC
  Administered 2017-12-29 – 2018-01-01 (×4): 500 mg via ORAL
  Filled 2017-12-29 (×4): qty 1

## 2017-12-29 MED ORDER — VANCOMYCIN HCL IN DEXTROSE 750-5 MG/150ML-% IV SOLN
750.0000 mg | Freq: Two times a day (BID) | INTRAVENOUS | Status: AC
  Administered 2017-12-29 – 2017-12-31 (×5): 750 mg via INTRAVENOUS
  Filled 2017-12-29 (×6): qty 150

## 2017-12-29 MED ORDER — SODIUM CHLORIDE 0.9 % IV SOLN
1000.0000 mL | INTRAVENOUS | Status: DC
  Administered 2017-12-29: 1000 mL via INTRAVENOUS

## 2017-12-29 MED ORDER — MIRABEGRON ER 25 MG PO TB24
25.0000 mg | ORAL_TABLET | Freq: Every day | ORAL | Status: DC
  Administered 2017-12-29 – 2018-01-01 (×4): 25 mg via ORAL
  Filled 2017-12-29 (×4): qty 1

## 2017-12-29 MED ORDER — SODIUM CHLORIDE 0.9 % IV BOLUS: 1040.0000 mL | Freq: Once | INTRAVENOUS | Status: DC

## 2017-12-29 MED ORDER — ORAL CARE MOUTH RINSE
15.0000 mL | Freq: Two times a day (BID) | OROMUCOSAL | Status: DC
  Administered 2017-12-29 – 2018-01-01 (×5): 15 mL via OROMUCOSAL

## 2017-12-29 MED ORDER — SODIUM CHLORIDE 0.9 % IV SOLN: INTRAVENOUS | Status: DC | PRN

## 2017-12-29 MED ORDER — IPRATROPIUM-ALBUTEROL 0.5-2.5 (3) MG/3ML IN SOLN
3.0000 mL | Freq: Once | RESPIRATORY_TRACT | Status: AC
  Administered 2017-12-29: 3 mL via RESPIRATORY_TRACT
  Filled 2017-12-29: qty 3

## 2017-12-29 MED ORDER — FERROUS SULFATE 325 (65 FE) MG PO TABS
325.0000 mg | ORAL_TABLET | Freq: Every day | ORAL | Status: DC
  Administered 2017-12-29 – 2018-01-01 (×4): 325 mg via ORAL
  Filled 2017-12-29 (×4): qty 1

## 2017-12-29 MED ORDER — SODIUM CHLORIDE 0.9 % IV SOLN
1.0000 g | Freq: Three times a day (TID) | INTRAVENOUS | Status: AC
  Administered 2017-12-29 – 2017-12-31 (×8): 1 g via INTRAVENOUS
  Filled 2017-12-29 (×10): qty 1

## 2017-12-29 MED ORDER — SODIUM CHLORIDE 0.9 % IV SOLN
1000.0000 mL | INTRAVENOUS | Status: DC
  Administered 2017-12-30: 1000 mL via INTRAVENOUS

## 2017-12-29 MED ORDER — METOCLOPRAMIDE HCL 10 MG PO TABS
5.0000 mg | ORAL_TABLET | Freq: Three times a day (TID) | ORAL | Status: DC
  Administered 2017-12-29 – 2018-01-01 (×12): 5 mg via ORAL
  Filled 2017-12-29 (×13): qty 1

## 2017-12-29 MED ORDER — ACETAMINOPHEN 325 MG PO TABS
650.0000 mg | ORAL_TABLET | Freq: Once | ORAL | Status: AC
  Administered 2017-12-29: 650 mg via ORAL
  Filled 2017-12-29: qty 2

## 2017-12-29 MED ORDER — OXYCODONE HCL 5 MG PO TABS
5.0000 mg | ORAL_TABLET | Freq: Four times a day (QID) | ORAL | Status: DC | PRN
  Administered 2017-12-29 – 2018-01-01 (×5): 10 mg via ORAL
  Filled 2017-12-29 (×5): qty 2

## 2017-12-29 MED ORDER — MOMETASONE FURO-FORMOTEROL FUM 200-5 MCG/ACT IN AERO
2.0000 | INHALATION_SPRAY | Freq: Two times a day (BID) | RESPIRATORY_TRACT | Status: DC
  Administered 2017-12-29 – 2017-12-31 (×5): 2 via RESPIRATORY_TRACT
  Filled 2017-12-29 (×2): qty 8.8

## 2017-12-29 MED ORDER — GADOBUTROL 1 MMOL/ML IV SOLN
6.0000 mL | Freq: Once | INTRAVENOUS | Status: AC | PRN
  Administered 2017-12-29: 6 mL via INTRAVENOUS

## 2017-12-29 MED ORDER — GUAIFENESIN ER 600 MG PO TB12
1200.0000 mg | ORAL_TABLET | Freq: Two times a day (BID) | ORAL | Status: DC
  Administered 2017-12-29 – 2018-01-01 (×7): 1200 mg via ORAL
  Filled 2017-12-29 (×7): qty 2

## 2017-12-29 MED ORDER — VANCOMYCIN HCL 10 G IV SOLR
1250.0000 mg | Freq: Once | INTRAVENOUS | Status: AC
  Administered 2017-12-29: 1250 mg via INTRAVENOUS
  Filled 2017-12-29: qty 1250

## 2017-12-29 MED ORDER — CELECOXIB 100 MG PO CAPS
200.0000 mg | ORAL_CAPSULE | Freq: Every day | ORAL | Status: DC
  Administered 2017-12-29 – 2018-01-01 (×4): 200 mg via ORAL
  Filled 2017-12-29 (×4): qty 2

## 2017-12-29 MED ORDER — ENOXAPARIN SODIUM 40 MG/0.4ML ~~LOC~~ SOLN
40.0000 mg | SUBCUTANEOUS | Status: DC
  Administered 2017-12-29 – 2018-01-01 (×4): 40 mg via SUBCUTANEOUS
  Filled 2017-12-29 (×4): qty 0.4

## 2017-12-29 MED ORDER — BUPROPION HCL ER (SR) 150 MG PO TB12
150.0000 mg | ORAL_TABLET | Freq: Two times a day (BID) | ORAL | Status: DC
  Administered 2017-12-29 – 2018-01-01 (×7): 150 mg via ORAL
  Filled 2017-12-29 (×7): qty 1

## 2017-12-29 MED ORDER — BENZONATATE 100 MG PO CAPS
100.0000 mg | ORAL_CAPSULE | Freq: Three times a day (TID) | ORAL | Status: DC
  Administered 2017-12-29 – 2018-01-01 (×10): 100 mg via ORAL
  Filled 2017-12-29 (×10): qty 1

## 2017-12-29 MED ORDER — OXYCODONE HCL 10 MG PO TABS: 10.0000 mg | ORAL_TABLET | Freq: Four times a day (QID) | ORAL | Status: DC

## 2017-12-29 MED ORDER — SIMVASTATIN 10 MG PO TABS
10.0000 mg | ORAL_TABLET | Freq: Every day | ORAL | Status: DC
  Administered 2017-12-30 – 2017-12-31 (×2): 10 mg via ORAL
  Filled 2017-12-29 (×3): qty 1

## 2017-12-29 MED ORDER — SODIUM CHLORIDE 0.9 % IV SOLN
INTRAVENOUS | Status: DC | PRN
  Administered 2017-12-29: 1000 mL via INTRAVENOUS

## 2017-12-29 MED ORDER — METOPROLOL SUCCINATE ER 25 MG PO TB24
25.0000 mg | ORAL_TABLET | Freq: Every day | ORAL | Status: DC
  Administered 2017-12-29 – 2018-01-01 (×4): 25 mg via ORAL
  Filled 2017-12-29 (×4): qty 1

## 2017-12-29 MED ORDER — SODIUM CHLORIDE 0.9 % IV SOLN
2.0000 g | Freq: Once | INTRAVENOUS | Status: AC
  Administered 2017-12-29: 2 g via INTRAVENOUS
  Filled 2017-12-29: qty 2

## 2017-12-29 MED ORDER — ALBUTEROL SULFATE (2.5 MG/3ML) 0.083% IN NEBU
2.5000 mg | INHALATION_SOLUTION | Freq: Once | RESPIRATORY_TRACT | Status: AC
  Administered 2017-12-29: 2.5 mg via RESPIRATORY_TRACT
  Filled 2017-12-29: qty 3

## 2017-12-29 MED ORDER — PANTOPRAZOLE SODIUM 40 MG PO TBEC
40.0000 mg | DELAYED_RELEASE_TABLET | Freq: Every day | ORAL | Status: DC
  Administered 2017-12-29 – 2018-01-01 (×4): 40 mg via ORAL
  Filled 2017-12-29 (×4): qty 1

## 2017-12-29 MED ORDER — OXYCODONE HCL 10 MG PO TABS: 5.0000 mg | ORAL_TABLET | Freq: Four times a day (QID) | ORAL | Status: DC | PRN

## 2017-12-29 MED ORDER — AMLODIPINE BESYLATE 5 MG PO TABS
5.0000 mg | ORAL_TABLET | Freq: Every day | ORAL | Status: DC
  Administered 2017-12-29 – 2018-01-01 (×4): 5 mg via ORAL
  Filled 2017-12-29 (×4): qty 1

## 2017-12-29 MED ORDER — FLUTICASONE PROPIONATE 50 MCG/ACT NA SUSP
2.0000 | Freq: Every day | NASAL | Status: DC
  Administered 2017-12-29 – 2018-01-01 (×5): 2 via NASAL
  Filled 2017-12-29: qty 16

## 2017-12-29 MED ORDER — MONTELUKAST SODIUM 10 MG PO TABS
10.0000 mg | ORAL_TABLET | Freq: Every day | ORAL | Status: DC
  Administered 2017-12-29 – 2017-12-31 (×3): 10 mg via ORAL
  Filled 2017-12-29 (×3): qty 1

## 2017-12-29 MED ORDER — ASPIRIN EC 81 MG PO TBEC
81.0000 mg | DELAYED_RELEASE_TABLET | Freq: Every day | ORAL | Status: DC
  Administered 2017-12-29 – 2018-01-01 (×4): 81 mg via ORAL
  Filled 2017-12-29 (×4): qty 1

## 2017-12-29 MED ORDER — IPRATROPIUM-ALBUTEROL 0.5-2.5 (3) MG/3ML IN SOLN
3.0000 mL | Freq: Four times a day (QID) | RESPIRATORY_TRACT | Status: DC
  Administered 2017-12-29 – 2017-12-30 (×5): 3 mL via RESPIRATORY_TRACT
  Filled 2017-12-29 (×5): qty 3

## 2017-12-29 MED ORDER — CHLORHEXIDINE GLUCONATE 0.12 % MT SOLN
15.0000 mL | Freq: Two times a day (BID) | OROMUCOSAL | Status: DC
  Administered 2017-12-29 – 2018-01-01 (×7): 15 mL via OROMUCOSAL
  Filled 2017-12-29 (×7): qty 15

## 2017-12-29 NOTE — Progress Notes (Deleted)
Cardiology Office Note    Date:  12/29/2017   ID:  Bradley [name], DOB [birth year], MRN [id number]  PCP:  Jani Gravel, MD  Cardiologist: Kate Sable, MD EPS: None  No chief complaint on file.   History of Present Illness:  Bradley [name] is a 77 y.o. 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  Follow-up echo 11/2016 normal LVEF 60 to 65% with grade 1 DD.  Most recent echo 11/26/2017 normal LVEF 55 to 60% normal wall motion grade 1 DD, pulmonary pressures mildly increased.  Patient also has hypertension and tobacco abuse.  Patient admitted to the hospital 12/29/2017 with pneumonia.  He also has newly diagnosed non-small cell lung CA, and not interested in any type of treatment for this.  He also had syncope with collapse felt secondary to pneumonia  Past Medical History:  Diagnosis Date  . Cancer ([place])    lung  . Chronic lower back pain   . Chronic pain    pain management  . Contact with stonefish as cause of accidental injury 1954   "stung across my left hand in [place]; LUE weaker since"  . COPD (chronic obstructive pulmonary disease) ([place])   . Coronary artery disease   . Dyspnea    with exertion  . High cholesterol   . History of blood transfusion 1943  . History of kidney stones    passed  . Hypertension   . Mass of left lung   . Migraine    "none since the 1990s" (09/11/2016)  . Myocardial infarction ([place]) 01/20/1993; 02/28/1993  . Neuromuscular disorder ([facility])    neuropathy left  . Pneumonia    "several times" (09/11/2016)  . Tobacco abuse     Past Surgical History:  Procedure Laterality Date  . APPENDECTOMY    . CARDIAC CATHETERIZATION    . CATARACT EXTRACTION, BILATERAL Bilateral   . COLONOSCOPY    . HIP ARTHROPLASTY Right 12/20/2016   Procedure: ARTHROPLASTY BIPOLAR HIP (HEMIARTHROPLASTY);  Surgeon: [name], MD;  Location: [facility];  Service: Orthopedics;  Laterality: Right;  .  JOINT REPLACEMENT    . LEFT HEART CATH AND CORONARY ANGIOGRAPHY N/A 09/12/2016   Procedure: LEFT HEART CATH AND CORONARY ANGIOGRAPHY;  Surgeon: Burnell Blanks, MD;  Location: [facility];  Service: Cardiovascular;  Laterality: N/A;  . NASAL ENDOSCOPY WITH EPISTAXIS CONTROL Right 03/15/2016   Procedure: NASAL ENDOSCOPY WITH EPISTAXIS CONTROL;  Surgeon: Jodi Marble, MD;  Location: [facility];  Service: ENT;  Laterality: Right;  . SEPTOPLASTY Right 03/15/2016   Procedure: SEPTOPLASTY;  Surgeon: Jodi Marble, MD;  Location: [facility];  Service: ENT;  Laterality: Right;  . SINUS EXPLORATION Right 03/15/2016   Procedure: SINUS EXPLORATION;  Surgeon: Jodi Marble, MD;  Location: [facility];  Service: ENT;  Laterality: Right;  . TONSILLECTOMY    . TOOTH EXTRACTION    . TOTAL KNEE ARTHROPLASTY Left   . VIDEO BRONCHOSCOPY WITH ENDOBRONCHIAL ULTRASOUND N/A 12/15/2017   Procedure: VIDEO BRONCHOSCOPY WITH ENDOBRONCHIAL ULTRASOUND;  Surgeon: Rigoberto Noel, MD;  Location: [facility];  Service: Thoracic;  Laterality: N/A;    Current Medications: No outpatient medications have been marked as taking for the 01/04/18 encounter (Appointment) with Imogene Burn, PA-C.     Allergies:   Aspirin   Social History   Socioeconomic History  . Marital status: Widowed    Spouse name: Not on file  .  Number of children: Not on file  . Years of education: Not on file  . Highest education level: Not on file  Occupational History  . Not on file  Social Needs  . Financial resource strain: Not on file  . Food insecurity:    Worry: Not on file    Inability: Not on file  . Transportation needs:    Medical: Not on file    Non-medical: Not on file  Tobacco Use  . Smoking status: Current Every Day Smoker    Packs/day: 0.40    Years: 70.00    Pack years: 28.00    Types: Cigarettes  . Smokeless tobacco: Never Used  . Tobacco comment: smokes 8 a day  Substance and Sexual Activity  . Alcohol use: No    Comment:  11/26/2017  "drank from the age of 8 til 03/05/1993"  . Drug use: Not Currently    Comment: 11/26/2017 "used some drugs when I was young"  . Sexual activity: Not Currently  Lifestyle  . Physical activity:    Days per week: Not on file    Minutes per session: Not on file  . Stress: Not on file  Relationships  . Social connections:    Talks on phone: Not on file    Gets together: Not on file    Attends religious service: Not on file    Active member of club or organization: Not on file    Attends meetings of clubs or organizations: Not on file    Relationship status: Not on file  Other Topics Concern  . Not on file  Social History Narrative  . Not on file     Family History:  The patient's ***family history includes Cancer in his mother; Obesity in his father. He was adopted.   ROS:   Please see the history of present illness.    ROS All other systems reviewed and are negative.   PHYSICAL EXAM:   VS:  There were no vitals taken for this visit.  Physical Exam  GEN: Well nourished, well developed, in no acute distress  HEENT: normal  Neck: no JVD, carotid bruits, or masses Cardiac:RRR; no murmurs, rubs, or gallops  Respiratory:  clear to auscultation bilaterally, normal work of breathing GI: soft, nontender, nondistended, + BS Ext: without cyanosis, clubbing, or edema, Good distal pulses bilaterally MS: no deformity or atrophy  Skin: warm and dry, no rash Neuro:  Alert and Oriented x 3, Strength and sensation are intact Psych: euthymic mood, full affect  Wt Readings from Last 3 Encounters:  12/29/17 149 lb 14.6 oz (68 kg)  12/15/17 150 lb (68 kg)  12/09/17 153 lb 6.4 oz (69.6 kg)      Studies/Labs Reviewed:   EKG:  EKG is*** ordered today.  The ekg ordered today demonstrates ***  Recent Labs: 12/29/2017: ALT 12; BUN 20; Creatinine, Ser 1.07; Hemoglobin 14.7; Platelets 435; Potassium 3.9; Sodium 135   Lipid Panel    Component Value Date/Time   CHOL 107 09/12/2016  0412   TRIG 87 09/12/2016 0412   HDL 36 (L) 09/12/2016 0412   CHOLHDL 3.0 09/12/2016 0412   VLDL 17 09/12/2016 0412   LDLCALC 54 09/12/2016 0412    Additional studies/ records that were reviewed today include:  2D echo 11/26/2017  Study Conclusions   - Left ventricle: The cavity size was normal. Wall thickness was   increased in a pattern of mild LVH. Systolic function was normal.   The estimated ejection fraction  was in the range of 55% to 60%.   Wall motion was normal; there were no regional wall motion   abnormalities. Doppler parameters are consistent with abnormal   left ventricular relaxation (grade 1 diastolic dysfunction). - Aortic valve: Moderately calcified annulus. Trileaflet;   moderately thickened leaflets. Valve area (VTI): 2.94 cm^2. Valve   area (Vmax): 2.6 cm^2. Valve area (Vmean): 2.56 cm^2. - Mitral valve: Mildly calcified annulus. Mildly thickened leaflets   . - Left atrium: The atrium was mildly dilated. - Atrial septum: No defect or patent foramen ovale was identified. - Pulmonary arteries: Systolic pressure was mildly increased. PA   peak pressure: 37 mm Hg (S). - Technically adequate study.   2D echo 12/20/2016 Study Conclusions   - Left ventricle: The cavity size was normal. Wall thickness was   increased in a pattern of mild LVH. Systolic function was normal.   The estimated ejection fraction was in the range of 55% to 60%.   Wall motion was normal; there were no regional wall motion   abnormalities. Doppler parameters are consistent with abnormal   left ventricular relaxation (grade 1 diastolic dysfunction). - Aortic valve: Moderately calcified annulus. Trileaflet;   moderately thickened leaflets. Valve area (VTI): 2.94 cm^2. Valve   area (Vmax): 2.6 cm^2. Valve area (Vmean): 2.56 cm^2. - Mitral valve: Mildly calcified annulus. Mildly thickened leaflets   . - Left atrium: The atrium was mildly dilated. - Atrial septum: No defect or patent  foramen ovale was identified. - Pulmonary arteries: Systolic pressure was mildly increased. PA   peak pressure: 37 mm Hg (S). - Technically adequate study.    Cardiac cath 8/20182nd Mrg lesion, 30 %stenosed.  The left ventricular systolic function is normal.  LV end diastolic pressure is normal.  The left ventricular ejection fraction is 45-50% by visual estimate.  There is no mitral valve regurgitation.   1. Mild non-obstructive CAD 2. Low normal LV systolic function with mild hypokinesis of the apical wall.  3. Normal filling pressures   Recommendations: Medical management of mild CAD with ASA and statin. Smoking cessation. He will need an echo before discharge to get a better assessment of his wall motion abnormality. This was ordered at [facility] but I cannot see that it was completed there.      ASSESSMENT:    1. Coronary artery disease involving native coronary artery of native heart without angina pectoris   2. Essential hypertension   3. Non-small cell lung cancer, unspecified laterality ([place])   4. Tobacco abuse      PLAN:  In order of problems listed above:  CAD nonobstructive on cath 08/2016  Essential hypertension  Non-small cell lung CA patient declined treatment.  Continues to smoke.  Tobacco abuse not interested in smoking cessation.    Medication Adjustments/Labs and Tests Ordered: Current medicines are reviewed at length with the patient today.  Concerns regarding medicines are outlined above.  Medication changes, Labs and Tests ordered today are listed in the Patient Instructions below. There are no Patient Instructions on file for this visit.   Signed, Ermalinda Barrios, PA-C  12/29/2017 2:10 PM    [facility] [address], [place], [place]  [address] Phone: [phone]; Fax: [phone]

## 2017-12-29 NOTE — Consult Note (Signed)
[facility] Consultation Oncology  Name: Bradley [name]      MRN: [id number]    Location: A310/A310-01  Date: 12/29/2017 Time:6:13 PM   REFERRING PHYSICIAN: Dr. Wynetta Emery  REASON FOR CONSULT: Newly diagnosed lung cancer   DIAGNOSIS: Non-small cell lung cancer, adenocarcinoma type  HISTORY OF PRESENT ILLNESS: Bradley [name] is a 77 year old very pleasant white male who is seen in consultation today for further work-up and management of newly diagnosed left lung cancer.  He went to the ER on 11/25/2017 with chest pain or shortness of breath.  CT scan PE protocol done showed left lower lobe peribronchial mass, abutting the posterior aspect of the left lower lobe bronchus and encasing segmental branches of the pulmonary arteries.  No mediastinal, hilar or axillary adenopathy was seen.  A PET CT scan on 12/08/2017 showed central lower lobe perihilar lung mass with intensive FDG uptake concerning for primary bronchogenic carcinoma.  Mild to moderate uptake is associated with right paratracheal and right hilar lymph nodes.  Since the last CT scan on 11/25/2017, there has been interval development of multifocal patchy nodular densities in the right upper lobe.  They exhibit SUV of 7.55.  This was thought to be highly likely postinflammatory/infectious in etiology.  Patient was admitted to the hospital with weakness and a fall.  He had a CT scan of the brain without contrast which did not show any metastatic disease.  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  He reports that he has been smoking 2 packs/day since age 4.  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  He recently moved back to [place] in 2017.  He lives with his daughter and son-in-law. He denies any change in cough or major hemoptysis.  He had minor hemoptysis x2 after the bronchoscopy on 12/15/2017.  This showed non-small cell lung cancer of the left lower lobe biopsy.  Biopsy of the 10 L node also showed malignant cells  consistent with non-small cell lung cancer.  He describes central chest pain for the last 10 years which is stable.  Otherwise he denies any new onset pains.  Is able to do all his activities around the house.  PAST MEDICAL HISTORY:   Past Medical History:  Diagnosis Date  . Cancer ([place])    lung  . Chronic lower back pain   . Chronic pain    pain management  . Contact with stonefish as cause of accidental injury 1954   "stung across my left hand in [place]; LUE weaker since"  . COPD (chronic obstructive pulmonary disease) ([place])   . Coronary artery disease   . Dyspnea    with exertion  . High cholesterol   . History of blood transfusion 1943  . History of kidney stones    passed  . Hypertension   . Mass of left lung   . Migraine    "none since the 1990s" (09/11/2016)  . Myocardial infarction ([place]) 01/20/1993; 02/28/1993  . Neuromuscular disorder ([facility])    neuropathy left  . Pneumonia    "several times" (09/11/2016)  . Tobacco abuse     ALLERGIES: Allergies  Allergen Reactions  . Aspirin     Regular asa       MEDICATIONS: I have reviewed the patient's current medications.     PAST SURGICAL HISTORY Past Surgical History:  Procedure Laterality Date  . APPENDECTOMY    . CARDIAC CATHETERIZATION    . CATARACT EXTRACTION, BILATERAL Bilateral   . COLONOSCOPY    .  HIP ARTHROPLASTY Right 12/20/2016   Procedure: ARTHROPLASTY BIPOLAR HIP (HEMIARTHROPLASTY);  Surgeon: [name], MD;  Location: [facility];  Service: Orthopedics;  Laterality: Right;  . JOINT REPLACEMENT    . LEFT HEART CATH AND CORONARY ANGIOGRAPHY N/A 09/12/2016   Procedure: LEFT HEART CATH AND CORONARY ANGIOGRAPHY;  Surgeon: Burnell Blanks, MD;  Location: [facility];  Service: Cardiovascular;  Laterality: N/A;  . NASAL ENDOSCOPY WITH EPISTAXIS CONTROL Right 03/15/2016   Procedure: NASAL ENDOSCOPY WITH EPISTAXIS CONTROL;  Surgeon: Jodi Marble, MD;  Location: [place] [place];  Service: ENT;  Laterality: Right;   . SEPTOPLASTY Right 03/15/2016   Procedure: SEPTOPLASTY;  Surgeon: Jodi Marble, MD;  Location: [facility];  Service: ENT;  Laterality: Right;  . SINUS EXPLORATION Right 03/15/2016   Procedure: SINUS EXPLORATION;  Surgeon: Jodi Marble, MD;  Location: [facility];  Service: ENT;  Laterality: Right;  . TONSILLECTOMY    . TOOTH EXTRACTION    . TOTAL KNEE ARTHROPLASTY Left   . VIDEO BRONCHOSCOPY WITH ENDOBRONCHIAL ULTRASOUND N/A 12/15/2017   Procedure: VIDEO BRONCHOSCOPY WITH ENDOBRONCHIAL ULTRASOUND;  Surgeon: Rigoberto Noel, MD;  Location: [facility];  Service: Thoracic;  Laterality: N/A;    FAMILY HISTORY: Family History  Adopted: Yes  Problem Relation Age of Onset  . Cancer Mother   . Obesity Father     SOCIAL HISTORY:  reports that he has been smoking cigarettes. He has a 28.00 pack-year smoking history. He has never used smokeless tobacco. He reports that he has current or past drug history. He reports that he does not drink alcohol.  PERFORMANCE STATUS: The patient's performance status is 1 - Symptomatic but completely ambulatory  PHYSICAL EXAM: Most Recent Vital Signs: Blood pressure (!) 127/59, pulse 79, temperature 98 F (36.7 C), temperature source Oral, resp. rate 18, height 6' (1.829 m), weight 149 lb 14.6 oz (68 kg), SpO2 94 %. BP (!) 127/59 (BP Location: Right Arm)   Pulse 79   Temp 98 F (36.7 C) (Oral)   Resp 18   Ht 6' (1.829 m)   Wt 149 lb 14.6 oz (68 kg)   SpO2 94%   BMI 20.33 kg/m  General appearance: alert, cooperative and appears stated age Lungs: clear to auscultation bilaterally Heart: regular rate and rhythm Abdomen: soft, non-tender; bowel sounds normal; no masses,  no organomegaly Extremities: extremities normal, atraumatic, no cyanosis or edema Skin: Skin color, texture, turgor normal. No rashes or lesions Lymph nodes: Cervical, supraclavicular, and axillary nodes normal. Neurologic: Grossly normal  LABORATORY DATA:  Results for orders placed or performed  during the hospital encounter of 12/29/17 (from the past 48 hour(s))  Comprehensive metabolic panel     Status: Abnormal   Collection Time: 12/29/17  7:52 AM  Result Value Ref Range   Sodium 135 135 - 145 mmol/L   Potassium 3.9 3.5 - 5.1 mmol/L   Chloride 105 98 - 111 mmol/L   CO2 22 22 - 32 mmol/L   Glucose, Bld 116 (H) 70 - 99 mg/dL   BUN 20 8 - 23 mg/dL   Creatinine, Ser 1.07 0.61 - 1.24 mg/dL   Calcium 8.6 (L) 8.9 - 10.3 mg/dL   Total Protein 7.4 6.5 - 8.1 g/dL   Albumin 3.4 (L) 3.5 - 5.0 g/dL   AST 15 15 - 41 U/L   ALT 12 0 - 44 U/L   Alkaline Phosphatase 109 38 - 126 U/L   Total Bilirubin 1.2 0.3 - 1.2 mg/dL   GFR  calc non Af Amer >60 >60 mL/min   GFR calc Af Amer >60 >60 mL/min   Anion gap 8 5 - 15    Comment: Performed at [facility], [address] [place], [place] [address]  CBC with Differential     Status: Abnormal   Collection Time: 12/29/17  7:52 AM  Result Value Ref Range   WBC 37.6 (H) 4.0 - 10.5 K/uL    Comment: WHITE COUNT CONFIRMED ON SMEAR   RBC 4.98 4.22 - 5.81 MIL/uL   Hemoglobin 14.7 13.0 - 17.0 g/dL   HCT 45.8 39.0 - 52.0 %   MCV 92.0 80.0 - 100.0 fL   MCH 29.5 26.0 - 34.0 pg   MCHC 32.1 30.0 - 36.0 g/dL   RDW 17.1 (H) 11.5 - 15.5 %   Platelets 435 (H) 150 - 400 K/uL   nRBC 0.0 0.0 - 0.2 %   Neutrophils Relative % 89 %   Neutro Abs 33.1 (H) 1.7 - 7.7 K/uL   Lymphocytes Relative 6 %   Lymphs Abs 2.3 0.7 - 4.0 K/uL   Monocytes Relative 4 %   Monocytes Absolute 1.7 (H) 0.1 - 1.0 K/uL   Eosinophils Relative 0 %   Eosinophils Absolute 0.0 0.0 - 0.5 K/uL   Basophils Relative 0 %   Basophils Absolute 0.2 (H) 0.0 - 0.1 K/uL   WBC Morphology VACUOLATED NEUTROPHILS    Immature Granulocytes 1 %   Abs Immature Granulocytes 0.43 (H) 0.00 - 0.07 K/uL    Comment: Performed at [facility], [address], [place], [place] [address]  Culture, blood (Routine x 2)     Status: None (Preliminary result)   Collection Time: 12/29/17  8:00 AM  Result Value Ref  Range   Specimen Description BLOOD RIGHT ARM    Special Requests      BOTTLES DRAWN AEROBIC AND ANAEROBIC Blood Culture adequate volume Performed at [facility], [address], [place], [place] [address]    Culture PENDING    Report Status PENDING   Troponin I - Once     Status: Abnormal   Collection Time: 12/29/17  8:00 AM  Result Value Ref Range   Troponin I 0.09 (HH) <0.03 ng/mL    Comment: CRITICAL RESULT CALLED TO, READ BACK BY AND VERIFIED WITH: EASTWOOD,J AT 8:40AM ON 12/29/17 BY [facility] Performed at [facility], [address], [place], [place] [address]   CK     Status: Abnormal   Collection Time: 12/29/17  8:00 AM  Result Value Ref Range   Total CK 42 (L) 49 - 397 U/L    Comment: Performed at [facility], [address] [place], [place] [address]  Ethanol     Status: None   Collection Time: 12/29/17  8:00 AM  Result Value Ref Range   Alcohol, Ethyl (B) <10 <10 mg/dL    Comment: (NOTE) Lowest detectable limit for serum alcohol is 10 mg/dL. For medical purposes only. Performed at [facility], [address], [place], [place] [address]   Culture, blood (Routine x 2)     Status: None (Preliminary result)   Collection Time: 12/29/17  8:05 AM  Result Value Ref Range   Specimen Description BLOOD RIGHT HAND    Special Requests      BOTTLES DRAWN AEROBIC AND ANAEROBIC Blood Culture adequate volume Performed at [facility], [address] [place],  [address]    Culture PENDING    Report Status PENDING   I-Stat CG4 Lactic Acid,  ED     Status: None   Collection Time: 12/29/17  8:09 AM  Result Value Ref Range   Lactic Acid, Venous 1.24 0.5 - 1.9 mmol/L  Protime-INR     Status: None   Collection Time: 12/29/17  9:04 AM  Result Value Ref Range   Prothrombin Time 13.8 11.4 - 15.2 seconds   INR 1.07     Comment: Performed at [facility], [address], [place], [place] [address]  I-Stat CG4 Lactic Acid, ED     Status: None   Collection Time: 12/29/17 10:47  AM  Result Value Ref Range   Lactic Acid, Venous 0.76 0.5 - 1.9 mmol/L      RADIOGRAPHY: Dg Chest 1 View  Result Date: 12/29/2017 CLINICAL DATA:  Cough, fever, weakness, history of lung carcinoma, smoking history EXAM: CHEST  1 VIEW COMPARISON:  CT chest of 12/08/2017 and chest x-ray of 11/25/2016 FINDINGS: The left lower lobe mass is not as well seen by chest x-ray. However there is increasing parenchymal opacity in the left mid and lower lung consistent with atelectasis and pneumonia. Chronic markings remain at the lung bases. No pleural effusion is seen. Mediastinal and hilar contours otherwise are unremarkable with some prominence again noted of the left infrahilar region. Heart size is stable. No bony abnormality is seen. IMPRESSION: 1. Increasing opacity in the left mid and lower lung most consistent with atelectasis and pneumonia possibly as a result of the central lesion noted by CT. 2. Fullness of the left infrahilar region as a result of the previously demonstrated mass by CT chest. Electronically Signed   By: Ivar Drape M.D.   On: 12/29/2017 08:41   Ct Head Wo Contrast  Result Date: 12/29/2017 CLINICAL DATA:  Weakness, head injury after fall. EXAM: CT HEAD WITHOUT CONTRAST CT CERVICAL SPINE WITHOUT CONTRAST TECHNIQUE: Multidetector CT imaging of the head and cervical spine was performed following the standard protocol without intravenous contrast. Multiplanar CT image reconstructions of the cervical spine were also generated. COMPARISON:  CT scan of November 25, 2017. FINDINGS: CT HEAD FINDINGS Brain: Old right frontal infarction is noted. Mild chronic ischemic white matter disease is noted. No mass effect or midline shift is noted. Ventricular size is within normal limits. There is no evidence of mass lesion, hemorrhage or acute infarction. Vascular: No hyperdense vessel or unexpected calcification. Skull: Normal. Negative for fracture or focal lesion. Sinuses/Orbits: Right maxillary  sinusitis is noted. Other: None. CT CERVICAL SPINE FINDINGS Alignment: Normal. Skull base and vertebrae: No acute fracture. No primary bone lesion or focal pathologic process. Soft tissues and spinal canal: No prevertebral fluid or swelling. No visible canal hematoma. Disc levels: Mild degenerative disc disease is noted at C5-6 and C6-7 with minimal anterior osteophyte formation. Upper chest: No acute abnormality seen. Other: Minimal degenerative changes are seen involving the right-sided posterior facet joints. 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 Right maxillary sinusitis. No acute intracranial abnormality seen. Mild multilevel degenerative disc disease. No acute abnormality seen in the cervical spine. Electronically Signed   By: Marijo Conception, M.D.   On: 12/29/2017 08:57   Ct Cervical Spine Wo Contrast  Result Date: 12/29/2017 CLINICAL DATA:  Weakness, head injury after fall. EXAM: CT HEAD WITHOUT CONTRAST CT CERVICAL SPINE WITHOUT CONTRAST TECHNIQUE: Multidetector CT imaging of the head and cervical spine was performed following the standard protocol without intravenous contrast. Multiplanar CT image reconstructions of the cervical spine were also generated. COMPARISON:  CT  scan of November 25, 2017. FINDINGS: CT HEAD FINDINGS Brain: Old right frontal infarction is noted. Mild chronic ischemic white matter disease is noted. No mass effect or midline shift is noted. Ventricular size is within normal limits. There is no evidence of mass lesion, hemorrhage or acute infarction. Vascular: No hyperdense vessel or unexpected calcification. Skull: Normal. Negative for fracture or focal lesion. Sinuses/Orbits: Right maxillary sinusitis is noted. Other: None. CT CERVICAL SPINE FINDINGS Alignment: Normal. Skull base and vertebrae: No acute fracture. No primary bone lesion or focal pathologic process. Soft tissues and spinal canal: No prevertebral fluid or swelling. No  visible canal hematoma. Disc levels: Mild degenerative disc disease is noted at C5-6 and C6-7 with minimal anterior osteophyte formation. Upper chest: No acute abnormality seen. Other: Minimal degenerative changes are seen involving the right-sided posterior facet joints. 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 Right maxillary sinusitis. No acute intracranial abnormality seen. Mild multilevel degenerative disc disease. No acute abnormality seen in the cervical spine. Electronically Signed   By: Marijo Conception, M.D.   On: 12/29/2017 08:57       PATHOLOGY: I have reviewed pathology report dated 12/15/2017 which shows adenocarcinoma of the lung biopsy.  I also reviewed FNA of the 10 L lymph node which was positive for malignant cells.  ASSESSMENT and PLAN:  1.  Adenocarcinoma the left lung: - I have discussed the biopsy results dated 12/15/2017 which shows adenocarcinoma of the left lower lobe lung mass, biopsy of the 10 L lymph node positive for malignant cells. - We have also reviewed PET CT scan results dated 12/08/2017 which shows central left lower lobe perihilar mass exhibiting intense FDG uptake.  Mild to moderate uptake associated with right paratracheal and right hilar lymph nodes. -There is also interval development of multiple focal patchy nodular densities in the right upper lobe.  This is new since the CAT scan from 11/25/2017.  Hence this is likely postinflammatory/infectious etiology. - I have recommended doing an MRI of the brain with and without contrast.  This will complete the staging work-up. - Based on the biopsy results, he has stage II disease.  However PET scan shows more extensive lymphadenopathy on the right side.  Again this could be inflammatory process with involvement of the right lung.  Hence I could not complete his staging process at this time. -I would like to see him back after his discharge.  I plan to repeat another scan of  the chest to see if the right lung lesions have resolved. -Patient is reluctant to consider any therapy at this time.  But he will likely consider treatment if he has early stage disease.  All questions were answered. The patient knows to call the clinic with any problems, questions or concerns. We can certainly see the patient much sooner if necessary.   Derek Jack

## 2017-12-29 NOTE — Progress Notes (Signed)
PMT consult received and chart reviewed. Pending oncology consult per patient request and assistance with prognosis. Spoke with daughter, Freda Munro, via telephone. She is available to meet with PMT provider at any time tomorrow, 12/4. PMT provider will meet with patient/daughter after oncology has seen. Thank you.   NO CHARGE  Ihor Dow, FNP-C Palliative Medicine Team  Phone: [phone] Fax: [phone]

## 2017-12-29 NOTE — Evaluation (Signed)
Physical Therapy Evaluation Patient Details Name: Bradley [name] MRN: [id number] DOB: May 04, 1940 Today's Date: 12/29/2017   History of Present Illness   Bradley [name] is a 77 y.o. male with newly diagnosed non-small cell lung cancer who recently had a lung biopsy done approximately 2 weeks ago presents to ED by EMS complaining of generalized weakness.  His symptoms have been ongoing for about 2 weeks.  The patient says that he became acutely more weak last night.  He fell at around 2 AM this morning.  The patient says that he continues to smoke cigarettes and has smoked since the age of 57.  The patient says that he is planning to stop smoking at this time.  The patient says that he has decided that he does not want to have chemotherapy or radiation treatment.  He says that he does want to see the oncologist to discuss his prognosis.  However, he is not interested in any further treatment after that.  The patient has had a productive cough of greenish sputum worsened over the past several days.  He has also had some fever and chills.  His daughter says that he has generally been weak over the past 2 weeks and that has gotten worse.  He has no nausea or vomiting.  He has occasional chest pains.    Clinical Impression  Patient demonstrates slow labored cadence with mostly 3 point gait pattern using SPC in left hand, limited secondary to c/o fatigue and fear of LLE giving out.  Patient tolerated sitting up in chair after therapy - RN aware.  Patient will benefit from continued physical therapy in hospital and recommended venue below to increase strength, balance, endurance for safe ADLs and gait.    Follow Up Recommendations SNF;Supervision for mobility/OOB;Supervision - Intermittent    Equipment Recommendations  None recommended by PT    Recommendations for Other Services       Precautions / Restrictions Precautions Precautions: Fall Restrictions Weight Bearing Restrictions: No       Mobility  Bed Mobility Overal bed mobility: Modified Independent                Transfers Overall transfer level: Needs assistance Equipment used: Straight cane Transfers: Sit to/from Stand;Stand Pivot Transfers Sit to Stand: Min guard Stand pivot transfers: Min assist       General transfer comment: slow labored movement  Ambulation/Gait Ambulation/Gait assistance: Min assist Gait Distance (Feet): 10 Feet Assistive device: Straight cane Gait Pattern/deviations: Step-to pattern;Decreased step length - right;Decreased step length - left;Decreased stride length Gait velocity: slow   General Gait Details: limited to 10-12 slow labored steps in room using SPC, declined to ambulate farther due to fear of LLE giving out and falling  Stairs            Wheelchair Mobility    Modified Rankin (Stroke Patients Only)       Balance Overall balance assessment: Needs assistance Sitting-balance support: Feet supported;No upper extremity supported Sitting balance-Leahy Scale: Good     Standing balance support: During functional activity;Single extremity supported Standing balance-Leahy Scale: Fair Standing balance comment: using SPC                             Pertinent Vitals/Pain Pain Assessment: Faces Faces Pain Scale: Hurts a little bit Pain Location: LLE Pain Descriptors / Indicators: Discomfort;Sore Pain Intervention(s): Limited activity within patient's tolerance;Monitored during session    Home Living Family/patient expects to be  discharged to:: Private residence Living Arrangements: Children Available Help at Discharge: Family Type of Home: House Home Access: Stairs to enter Entrance Stairs-Rails: None Entrance Stairs-Number of Steps: 2 Home Layout: One level Home Equipment: [place] - single point;Walker - 2 wheels;Bedside commode      Prior Function Level of Independence: Independent with assistive device(s)         Comments:  household and short distanced community ambulator with [facility]     Hand Dominance        Extremity/Trunk Assessment   Upper Extremity Assessment Upper Extremity Assessment: Generalized weakness    Lower Extremity Assessment Lower Extremity Assessment: Generalized weakness;LLE deficits/detail LLE Deficits / Details: grossly 3+/5    Cervical / Trunk Assessment Cervical / Trunk Assessment: Normal  Communication   Communication: No difficulties  Cognition Arousal/Alertness: Awake/alert Behavior During Therapy: WFL for tasks assessed/performed Overall Cognitive Status: Within Functional Limits for tasks assessed                                        General Comments      Exercises     Assessment/Plan    PT Assessment Patient needs continued PT services  PT Problem List Decreased strength;Decreased activity tolerance;Decreased balance;Decreased mobility       PT Treatment Interventions Gait training;Stair training;Functional mobility training;Therapeutic activities;Therapeutic exercise;Patient/family education    PT Goals (Current goals can be found in the Care Plan section)  Acute Rehab PT Goals Patient Stated Goal: return home with family to assist PT Goal Formulation: With patient Time For Goal Achievement: 01/12/18 Potential to Achieve Goals: Good    Frequency Min 3X/week   Barriers to discharge        Co-evaluation               AM-PAC PT "6 Clicks" Mobility  Outcome Measure Help needed turning from your back to your side while in a flat bed without using bedrails?: None Help needed moving from lying on your back to sitting on the side of a flat bed without using bedrails?: None Help needed moving to and from a bed to a chair (including a wheelchair)?: A Little Help needed standing up from a chair using your arms (e.g., wheelchair or bedside chair)?: A Little Help needed to walk in hospital room?: A Little Help needed climbing 3-5  steps with a railing? : A Lot 6 Click Score: 19    End of Session Equipment Utilized During Treatment: Gait belt Activity Tolerance: Patient limited by fatigue;Patient tolerated treatment well Patient left: in chair;with call bell/phone within reach Nurse Communication: Mobility status PT Visit Diagnosis: Unsteadiness on feet (R26.81);Other abnormalities of gait and mobility (R26.89);Muscle weakness (generalized) (M62.81)    Time: 7588-3254 PT Time Calculation (min) (ACUTE ONLY): 25 min   Charges:   PT Evaluation $PT Eval Moderate Complexity: 1 Mod PT Treatments $Therapeutic Activity: 23-37 mins        4:07 PM, 12/29/17 Lonell Grandchild, MPT Physical Therapist with [facility] [phone] office [phone] mobile phone

## 2017-12-29 NOTE — ED Triage Notes (Signed)
EMS reports pt c/o generalized weakness since last night.  Pt says he was going to the doctor today but was too weak.  Pt fell around 2am this morning.  Pt alert oriented.  CBG 120.  C/O r hip and left knee pain.  EMS reports pt has a skin tear to left forearm.

## 2017-12-29 NOTE — Progress Notes (Signed)
Pharmacy Antibiotic Note  Bradley [name] is a 77 y.o. male admitted on 12/29/2017 with unknown source. Pharmacy has been consulted for Vancomycin and Cefepime dosing.  Plan: Vancomycin 1250mg  IV loading dose, then 750mg  IV every 12 hours.  Goal trough 15-20 mcg/mL.  Cefepime 2gm loading dose, then 1gm IV q8h F/U cxs and clinical progress Monitor V/S, labs, and levels as indicated  Height: 6' (182.9 cm) Weight: 149 lb 14.6 oz (68 kg) IBW/kg (Calculated) : 77.6  Temp (24hrs), Avg:100.3 F (37.9 C), Min:98 F (36.7 C), Max:102 F (38.9 C)  Recent Labs  Lab 12/29/17 0752 12/29/17 0809 12/29/17 1047  WBC 37.6*  --   --   CREATININE 1.07  --   --   LATICACIDVEN  --  1.24 0.76    Estimated Creatinine Clearance: 55.6 mL/min (by C-G formula based on SCr of 1.07 mg/dL).    Allergies  Allergen Reactions  . Aspirin     Regular asa     Antimicrobials this admission: Vancomycin 12/3 >>  Cefepime 12/3 >>   Dose adjustments this admission: n/a  Microbiology results: 12/3 BCx: pending  UCx:   Sputum:   11/26/17 MRSA PCR: positive  Thank you for allowing pharmacy to be a part of this patient's care.  Isac Sarna, BS Pharm D, [place] Clinical Pharmacist Pager [phone] 12/29/2017 11:50 AM

## 2017-12-29 NOTE — H&P (Signed)
History and Physical  Bradley [name] IEP:[id number] DOB: 1940/11/11 DOA: 12/29/2017  Referring physician: Thurnell Garbe  PCP: Jani Gravel, MD   Chief Complaint: weakness   HPI: Bradley [name] is a 77 y.o. male with newly diagnosed non-small cell lung cancer who recently had a lung biopsy done approximately 2 weeks ago presents to ED by EMS complaining of generalized weakness.  His symptoms have been ongoing for about 2 weeks.  The patient says that he became acutely more weak last night.  He fell at around 2 AM this morning.  The patient says that he continues to smoke cigarettes and has smoked since the age of 22.  The patient says that he is planning to stop smoking at this time.  The patient says that he has decided that he does not want to have chemotherapy or radiation treatment.  He says that he does want to see the oncologist to discuss his prognosis.  However, he is not interested in any further treatment after that.  The patient has had a productive cough of greenish sputum worsened over the past several days.  He has also had some fever and chills.  His daughter says that he has generally been weak over the past 2 weeks and that has gotten worse.  He has no nausea or vomiting.  He has occasional chest pains.  ED course: Patient was evaluated in the ED and noted to be febrile with a rectal temperature of 102 degrees.  The patient reported symptoms of being lightheaded.  His white blood cell count was markedly elevated at 37.  His lactate remained within normal limits.  His blood pressure was soft on admission but responded to a fluid bolus.  He was hypoxic on arrival with a pulse ox of 87% on room air.  His troponin was mildly elevated at 0.09.  The patient was started on treatment for nosocomial associated pneumonia given his recent lung biopsy procedure.  Review of Systems: All systems reviewed and apart from history of presenting illness, are negative.  Past Medical History:  Diagnosis Date    . Cancer ([place])    lung  . Chronic lower back pain   . Chronic pain    pain management  . Contact with stonefish as cause of accidental injury 1954   "stung across my left hand in [place]; LUE weaker since"  . COPD (chronic obstructive pulmonary disease) ([place])   . Coronary artery disease   . Dyspnea    with exertion  . High cholesterol   . History of blood transfusion 1943  . History of kidney stones    passed  . Hypertension   . Mass of left lung   . Migraine    "none since the 1990s" (09/11/2016)  . Myocardial infarction ([place]) 01/20/1993; 02/28/1993  . Neuromuscular disorder ([facility])    neuropathy left  . Pneumonia    "several times" (09/11/2016)  . Tobacco abuse    Past Surgical History:  Procedure Laterality Date  . APPENDECTOMY    . CARDIAC CATHETERIZATION    . CATARACT EXTRACTION, BILATERAL Bilateral   . COLONOSCOPY    . HIP ARTHROPLASTY Right 12/20/2016   Procedure: ARTHROPLASTY BIPOLAR HIP (HEMIARTHROPLASTY);  Surgeon: [name], MD;  Location: [facility];  Service: Orthopedics;  Laterality: Right;  . JOINT REPLACEMENT    . LEFT HEART CATH AND CORONARY ANGIOGRAPHY N/A 09/12/2016   Procedure: LEFT HEART CATH AND CORONARY ANGIOGRAPHY;  Surgeon: Burnell Blanks, MD;  Location: [facility];  Service: Cardiovascular;  Laterality: N/A;  . NASAL ENDOSCOPY WITH EPISTAXIS CONTROL Right 03/15/2016   Procedure: NASAL ENDOSCOPY WITH EPISTAXIS CONTROL;  Surgeon: Jodi Marble, MD;  Location: [facility];  Service: ENT;  Laterality: Right;  . SEPTOPLASTY Right 03/15/2016   Procedure: SEPTOPLASTY;  Surgeon: Jodi Marble, MD;  Location: [facility];  Service: ENT;  Laterality: Right;  . SINUS EXPLORATION Right 03/15/2016   Procedure: SINUS EXPLORATION;  Surgeon: Jodi Marble, MD;  Location: [facility];  Service: ENT;  Laterality: Right;  . TONSILLECTOMY    . TOOTH EXTRACTION    . TOTAL KNEE ARTHROPLASTY Left   . VIDEO BRONCHOSCOPY WITH ENDOBRONCHIAL ULTRASOUND N/A 12/15/2017   Procedure:  VIDEO BRONCHOSCOPY WITH ENDOBRONCHIAL ULTRASOUND;  Surgeon: Rigoberto Noel, MD;  Location: [facility];  Service: Thoracic;  Laterality: N/A;   Social History:  reports that he has been smoking cigarettes. He has a 28.00 pack-year smoking history. He has never used smokeless tobacco. He reports that he has current or past drug history. He reports that he does not drink alcohol.  Allergies  Allergen Reactions  . Aspirin     Regular asa     Family History  Adopted: Yes  Problem Relation Age of Onset  . Cancer Mother   . Obesity Father     Prior to Admission medications   Medication Sig Start Date End Date Taking? Authorizing Provider  ADVAIR HFA 115-21 MCG/ACT inhaler Inhale 2 puffs into the lungs every 12 (twelve) hours. 01/29/16   [provider]  amLODipine (NORVASC) 5 MG tablet TAKE 1 TABLET BY MOUTH EVERY DAY Patient taking differently: Take 5 mg by mouth daily.  08/24/17   Lendon Colonel, NP  Ascorbic Acid (VITAMIN C) 500 MG CAPS Take 500 mg by mouth daily.    [provider]  aspirin EC 81 MG tablet Take 1 tablet by mouth daily.    [provider]  buPROPion (WELLBUTRIN SR) 150 MG 12 hr tablet Take 1 tablet (150 mg total) by mouth 2 (two) times daily. Take 1 tablet daily for 1 week, stop smoking, then take twice daily after first week. 12/09/17 01/08/18  Fenton Foy, NP  celecoxib (CELEBREX) 200 MG capsule Take 1 capsule (200 mg total) by mouth daily. 12/22/16   [name], MD  cyclobenzaprine (FLEXERIL) 5 MG tablet Take 5 mg by mouth 2 (two) times daily.     [provider]  Ferrous Sulfate (IRON) 325 (65 Fe) MG TABS Take 1 tablet by mouth daily. 12/10/15   [provider]  gabapentin (NEURONTIN) 300 MG capsule Take 1 capsule by mouth 3 (three) times daily. 08/15/16   [provider]  lisinopril (PRINIVIL,ZESTRIL) 20 MG tablet Take 1 tablet (20 mg total) by mouth daily. 11/27/17   Hongalgi, Lenis Dickinson, MD  metoCLOPramide  (REGLAN) 5 MG tablet Take 1 tablet by mouth 4 (four) times daily -  before meals and at bedtime. 01/25/16   [provider]  metoprolol succinate (TOPROL-XL) 25 MG 24 hr tablet Take 25 mg by mouth daily.    [provider]  mirabegron ER (MYRBETRIQ) 25 MG TB24 tablet Take 25 mg by mouth daily.    [provider]  nitroGLYCERIN (NITROSTAT) 0.4 MG SL tablet Place 0.4 mg under the tongue every 5 (five) minutes as needed for chest pain.    [provider]  omeprazole (PRILOSEC) 40 MG capsule Take 40 mg by mouth daily.     [provider]  Oxycodone HCl 10 MG TABS Take 10 mg by mouth 4 (four) times daily.     [provider]  potassium gluconate (HM POTASSIUM) 595 (99 K) MG TABS tablet Take 595 mg by mouth daily.    [provider]  simvastatin (ZOCOR) 10 MG tablet Take 10 mg by mouth daily. 12/23/15   [provider]  triamcinolone cream (KENALOG) 0.5 % APPLY TOPICAL TO AFFECTED AREA 2 TIMES A DAY AS NEEDED FOR RASH 12/01/17   [provider]  VENTOLIN HFA 108 (90 Base) MCG/ACT inhaler Inhale 2 puffs into the lungs every 4 (four) hours as needed for wheezing or shortness of breath.  12/11/15   [provider]   Physical Exam: Vitals:   12/29/17 0830 12/29/17 0856 12/29/17 0900 12/29/17 0930  BP: 109/71  125/61 109/64  Pulse: 81  78 78  Resp: 19  (!) 25 18  Temp:  (!) 100.9 F (38.3 C)    TempSrc:  Rectal    SpO2: 96%  98% 94%  Weight:      Height:         General exam: emaciated, chronically ill appearing male, cooperative, lying comfortably supine on the gurney in no obvious distress.  Head, eyes and ENT: Nontraumatic and normocephalic. Pupils equally reacting to light and accommodation. Oral mucosa dry.  Neck: Supple. No JVD, carotid bruit or thyromegaly.  Lymphatics: No lymphadenopathy.  Respiratory system: diminished BS LLL. No increased work of breathing.  Cardiovascular system: S1 and S2  heard, RRR. No JVD, murmurs, gallops, clicks or pedal edema.  Gastrointestinal system: Abdomen is nondistended, soft and nontender. Normal bowel sounds heard. No organomegaly or masses appreciated.  Central nervous system: Alert and oriented. No focal neurological deficits.  Extremities: Symmetric 5 x 5 power. Peripheral pulses symmetrically felt.   Skin: No rashes or acute findings.  Musculoskeletal system: Negative exam.  Psychiatry: Pleasant and cooperative.  Labs on Admission:  Basic Metabolic Panel: Recent Labs  Lab 12/29/17 0752  NA 135  K 3.9  CL 105  CO2 22  GLUCOSE 116*  BUN 20  CREATININE 1.07  CALCIUM 8.6*   Liver Function Tests: Recent Labs  Lab 12/29/17 0752  AST 15  ALT 12  ALKPHOS 109  BILITOT 1.2  PROT 7.4  ALBUMIN 3.4*   No results for input(s): LIPASE, AMYLASE in the last 168 hours. No results for input(s): AMMONIA in the last 168 hours. CBC: Recent Labs  Lab 12/29/17 0752  WBC 37.6*  NEUTROABS 33.1*  HGB 14.7  HCT 45.8  MCV 92.0  PLT 435*   Cardiac Enzymes: Recent Labs  Lab 12/29/17 0800  CKTOTAL 42*  TROPONINI 0.09*    BNP (last 3 results) No results for input(s): PROBNP in the last 8760 hours. CBG: No results for input(s): GLUCAP in the last 168 hours.  Radiological Exams on Admission: Dg Chest 1 View  Result Date: 12/29/2017 CLINICAL DATA:  Cough, fever, weakness, history of lung carcinoma, smoking history EXAM: CHEST  1 VIEW COMPARISON:  CT chest of 12/08/2017 and chest x-ray of 11/25/2016 FINDINGS: The left lower lobe mass is not as well seen by chest x-ray. However there is increasing parenchymal opacity in the left mid and lower lung consistent with atelectasis and pneumonia. Chronic markings remain at the lung bases. No pleural effusion is seen. Mediastinal and hilar contours otherwise are unremarkable with some prominence again noted of the left infrahilar region. Heart size is stable. No bony abnormality is seen.  IMPRESSION: 1.  Increasing opacity in the left mid and lower lung most consistent with atelectasis and pneumonia possibly as a result of the central lesion noted by CT. 2. Fullness of the left infrahilar region as a result of the previously demonstrated mass by CT chest. Electronically Signed   By: Ivar Drape M.D.   On: 12/29/2017 08:41   Ct Head Wo Contrast  Result Date: 12/29/2017 CLINICAL DATA:  Weakness, head injury after fall. EXAM: CT HEAD WITHOUT CONTRAST CT CERVICAL SPINE WITHOUT CONTRAST TECHNIQUE: Multidetector CT imaging of the head and cervical spine was performed following the standard protocol without intravenous contrast. Multiplanar CT image reconstructions of the cervical spine were also generated. COMPARISON:  CT scan of November 25, 2017. FINDINGS: CT HEAD FINDINGS Brain: Old right frontal infarction is noted. Mild chronic ischemic white matter disease is noted. No mass effect or midline shift is noted. Ventricular size is within normal limits. There is no evidence of mass lesion, hemorrhage or acute infarction. Vascular: No hyperdense vessel or unexpected calcification. Skull: Normal. Negative for fracture or focal lesion. Sinuses/Orbits: Right maxillary sinusitis is noted. Other: None. CT CERVICAL SPINE FINDINGS Alignment: Normal. Skull base and vertebrae: No acute fracture. No primary bone lesion or focal pathologic process. Soft tissues and spinal canal: No prevertebral fluid or swelling. No visible canal hematoma. Disc levels: Mild degenerative disc disease is noted at C5-6 and C6-7 with minimal anterior osteophyte formation. Upper chest: No acute abnormality seen. Other: Minimal degenerative changes are seen involving the right-sided posterior facet joints. 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 Right maxillary sinusitis. No acute intracranial abnormality seen. Mild multilevel degenerative disc disease. No acute abnormality seen in the  cervical spine. Electronically Signed   By: Marijo Conception, M.D.   On: 12/29/2017 08:57   Ct Cervical Spine Wo Contrast  Result Date: 12/29/2017 CLINICAL DATA:  Weakness, head injury after fall. EXAM: CT HEAD WITHOUT CONTRAST CT CERVICAL SPINE WITHOUT CONTRAST TECHNIQUE: Multidetector CT imaging of the head and cervical spine was performed following the standard protocol without intravenous contrast. Multiplanar CT image reconstructions of the cervical spine were also generated. COMPARISON:  CT scan of November 25, 2017. FINDINGS: CT HEAD FINDINGS Brain: Old right frontal infarction is noted. Mild chronic ischemic white matter disease is noted. No mass effect or midline shift is noted. Ventricular size is within normal limits. There is no evidence of mass lesion, hemorrhage or acute infarction. Vascular: No hyperdense vessel or unexpected calcification. Skull: Normal. Negative for fracture or focal lesion. Sinuses/Orbits: Right maxillary sinusitis is noted. Other: None. CT CERVICAL SPINE FINDINGS Alignment: Normal. Skull base and vertebrae: No acute fracture. No primary bone lesion or focal pathologic process. Soft tissues and spinal canal: No prevertebral fluid or swelling. No visible canal hematoma. Disc levels: Mild degenerative disc disease is noted at C5-6 and C6-7 with minimal anterior osteophyte formation. Upper chest: No acute abnormality seen. Other: Minimal degenerative changes are seen involving the right-sided posterior facet joints. 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 Right maxillary sinusitis. No acute intracranial abnormality seen. Mild multilevel degenerative disc disease. No acute abnormality seen in the cervical spine. Electronically Signed   By: Marijo Conception, M.D.   On: 12/29/2017 08:57    EKG: Independently reviewed. NSR  Assessment/Plan Principal Problem:   Nosocomial pneumonia Active Problems:   Tobacco abuse   Emphysema,  unspecified ([facility]CC)   CAD (coronary artery disease), native coronary artery   Essential hypertension  Leukocytosis   Syncope and collapse   Mass of lower lobe of left lung   Non-small cell lung cancer ([place])   1. Nosocomial pneumonia - Admit for IV antibiotics and respiratory support.  Continue broad spectrum antibiotics for now.   2. Newly diagnosed non small cell lung cancer - Pt says that he does want to see the oncologist to discuss prognosis but he is not interested in any type of treatment at this time.  I have requested an oncology consult per patient's request.  I am also consulting palliative medicine to meet with the patient as he does not desire treatment for this cancer. 3. Syncope and collapse-I suspect he is generally weak and the pneumonia may have exacerbated his symptoms of weakness.  We are treating him supportively.  I have asked for PT evaluation and fall precautions.  He will be monitored on telemetry overnight. 4. Leukocytosis-markedly elevated WBC likely related to pneumonia and recent biopsy and cancer.  Repeat CBC with differential in the morning. 5. Tobacco abuse- I did speak with the patient about his ongoing smoking however at this time he declines to stop smoking and says that he already has lung cancer and he is going to enjoy the rest of his life. 6. Essential hypertension-his blood pressures are soft now and we are holding any blood pressure medications.  Will readdress if needed. 7. I discussed CODE STATUS with the patient and he was very clear that he would be DNR/DNI.  He says that he understands that at his advanced age he would likely not survive being intubated or having CPR.   DVT Prophylaxis: Lovenox Code Status: DNR Family Communication: Daughter at bedside Disposition Plan: To be determined after further discussions, inpatient for IV antibiotics and IV fluids  Time spent: 56 minutes  Irwin Brakeman, MD [facility] Pager [phone]  If  7PM-7AM, please contact night-coverage [email or web address] Password [email or web address] 12/29/2017, 10:19 AM

## 2017-12-29 NOTE — ED Notes (Signed)
CRITICAL VALUE ALERT  Critical Value:  Troponin 0.09  Date & Time Notied:  12-29-17 0842  Provider Notified: Thurnell Garbe  Orders Received/Actions taken: continue to monitor

## 2017-12-29 NOTE — ED Provider Notes (Signed)
[facility] EMERGENCY DEPARTMENT Provider Note   CSN: [id number] Arrival date & time: 12/29/17  0865     History   Chief Complaint Chief Complaint  Patient presents with  . Weakness    HPI Bradley [name] is a 77 y.o. male.   Weakness     Pt was seen at 0755. Per pt, c/o unknown onset and persistence of constant generalized weakness that began last night. Pt states he got out of bed at 0200 and fell "because my left leg gave out" and he felt lightheaded. States he "scraped" his left forearm against furniture when he fell.  Pt states he was unable to stand on his LLE due to weakness in the entire leg. Pt states he laid on the floor for "a few hours" before he was able to get himself back into bed. Pt states he has had a cough for the past week, rx abx by his PMD without improvement. Pt does not know if he's had a fever. Denies syncope, no CP/palpitations, no SOB, no abd pain, no N/V/D, no joints pain after fall, no neck or back pain, no facial droop, no slurred speech.   Past Medical History:  Diagnosis Date  . Cancer ([place])    lung  . Chronic lower back pain   . Chronic pain    pain management  . Contact with stonefish as cause of accidental injury 1954   "stung across my left hand in [place]; LUE weaker since"  . COPD (chronic obstructive pulmonary disease) ([place])   . Coronary artery disease   . Dyspnea    with exertion  . High cholesterol   . History of blood transfusion 1943  . History of kidney stones    passed  . Hypertension   . Mass of left lung   . Migraine    "none since the 1990s" (09/11/2016)  . Myocardial infarction ([place]) 01/20/1993; 02/28/1993  . Neuromuscular disorder ([facility])    neuropathy left  . Pneumonia    "several times" (09/11/2016)  . Tobacco abuse     Patient Active Problem List   Diagnosis Date Noted  . Mass of lower lobe of left lung 12/10/2017  . Chest pain in adult   . Syncope and collapse   . Rt Hip fracture ([place]) 12/19/2016  . Chest pain  09/12/2016  . Leukocytosis 09/12/2016  . Abnormal chest x-ray 09/12/2016  . Hyperlipidemia 09/12/2016  . Hypokalemia 09/12/2016  . Precordial pain   . Tobacco abuse 09/11/2016  . Emphysema, unspecified ([place]) 09/11/2016  . CAD (coronary artery disease), native coronary artery 09/11/2016  . Chronic pain syndrome 09/11/2016  . Essential hypertension 09/11/2016  . Right-sided epistaxis 03/15/2016    Past Surgical History:  Procedure Laterality Date  . APPENDECTOMY    . CARDIAC CATHETERIZATION    . CATARACT EXTRACTION, BILATERAL Bilateral   . COLONOSCOPY    . HIP ARTHROPLASTY Right 12/20/2016   Procedure: ARTHROPLASTY BIPOLAR HIP (HEMIARTHROPLASTY);  Surgeon: [name], MD;  Location: [place];  Service: Orthopedics;  Laterality: Right;  . JOINT REPLACEMENT    . LEFT HEART CATH AND CORONARY ANGIOGRAPHY N/A 09/12/2016   Procedure: LEFT HEART CATH AND CORONARY ANGIOGRAPHY;  Surgeon: Burnell Blanks, MD;  Location: [place] [facility];  Service: Cardiovascular;  Laterality: N/A;  . NASAL ENDOSCOPY WITH EPISTAXIS CONTROL Right 03/15/2016   Procedure: NASAL ENDOSCOPY WITH EPISTAXIS CONTROL;  Surgeon: Jodi Marble, MD;  Location: [place];  Service: ENT;  Laterality: Right;  . SEPTOPLASTY Right  03/15/2016   Procedure: SEPTOPLASTY;  Surgeon: Jodi Marble, MD;  Location: [place];  Service: ENT;  Laterality: Right;  . SINUS EXPLORATION Right 03/15/2016   Procedure: SINUS EXPLORATION;  Surgeon: Jodi Marble, MD;  Location: [place];  Service: ENT;  Laterality: Right;  . TONSILLECTOMY    . TOOTH EXTRACTION    . TOTAL KNEE ARTHROPLASTY Left   . VIDEO BRONCHOSCOPY WITH ENDOBRONCHIAL ULTRASOUND N/A 12/15/2017   Procedure: VIDEO BRONCHOSCOPY WITH ENDOBRONCHIAL ULTRASOUND;  Surgeon: Rigoberto Noel, MD;  Location: [facility];  Service: Thoracic;  Laterality: N/A;        Home Medications    Prior to Admission medications   Medication Sig Start Date End Date Taking? Authorizing Provider  ADVAIR HFA  115-21 MCG/ACT inhaler Inhale 2 puffs into the lungs every 12 (twelve) hours. 01/29/16   [provider]  amLODipine (NORVASC) 5 MG tablet TAKE 1 TABLET BY MOUTH EVERY DAY Patient taking differently: Take 5 mg by mouth daily.  08/24/17   Lendon Colonel, NP  Ascorbic Acid (VITAMIN C) 500 MG CAPS Take 500 mg by mouth daily.    [provider]  aspirin EC 81 MG tablet Take 1 tablet by mouth daily.    [provider]  buPROPion (WELLBUTRIN SR) 150 MG 12 hr tablet Take 1 tablet (150 mg total) by mouth 2 (two) times daily. Take 1 tablet daily for 1 week, stop smoking, then take twice daily after first week. 12/09/17 01/08/18  Fenton Foy, NP  celecoxib (CELEBREX) 200 MG capsule Take 1 capsule (200 mg total) by mouth daily. 12/22/16   [name], MD  cyclobenzaprine (FLEXERIL) 5 MG tablet Take 5 mg by mouth 2 (two) times daily.     [provider]  Ferrous Sulfate (IRON) 325 (65 Fe) MG TABS Take 1 tablet by mouth daily. 12/10/15   [provider]  gabapentin (NEURONTIN) 300 MG capsule Take 1 capsule by mouth 3 (three) times daily. 08/15/16   [provider]  lisinopril (PRINIVIL,ZESTRIL) 20 MG tablet Take 1 tablet (20 mg total) by mouth daily. 11/27/17   Hongalgi, Lenis Dickinson, MD  metoCLOPramide (REGLAN) 5 MG tablet Take 1 tablet by mouth 4 (four) times daily -  before meals and at bedtime. 01/25/16   [provider]  metoprolol succinate (TOPROL-XL) 25 MG 24 hr tablet Take 25 mg by mouth daily.    [provider]  mirabegron ER (MYRBETRIQ) 25 MG TB24 tablet Take 25 mg by mouth daily.    [provider]  nitroGLYCERIN (NITROSTAT) 0.4 MG SL tablet Place 0.4 mg under the tongue every 5 (five) minutes as needed for chest pain.    [provider]  omeprazole (PRILOSEC) 40 MG capsule Take 40 mg by mouth daily.     [provider]  Oxycodone HCl 10 MG TABS Take 10 mg by mouth 4 (four) times daily.     [provider]  potassium gluconate (HM POTASSIUM) 595 (99 K) MG TABS tablet Take 595 mg by mouth daily.    [provider]  simvastatin (ZOCOR) 10 MG tablet Take 10 mg by mouth daily. 12/23/15   [provider]  triamcinolone cream (KENALOG) 0.5 % APPLY TOPICAL TO AFFECTED AREA 2 TIMES A DAY AS NEEDED FOR RASH 12/01/17   [provider]  VENTOLIN HFA 108 (90 Base) MCG/ACT inhaler Inhale 2 puffs into the lungs every 4 (four) hours as needed for wheezing or shortness of breath.  12/11/15  [provider]    Family History Family History  Adopted: Yes  Problem Relation Age of Onset  . Cancer Mother   . Obesity Father     Social History Social History   Tobacco Use  . Smoking status: Current Every Day Smoker    Packs/day: 0.40    Years: 70.00    Pack years: 28.00    Types: Cigarettes  . Smokeless tobacco: Never Used  . Tobacco comment: smokes 8 a day  Substance Use Topics  . Alcohol use: No    Comment: 11/26/2017  "drank from the age of 8 til 03/05/1993"  . Drug use: Not Currently    Comment: 11/26/2017 "used some drugs when I was young"     Allergies   Aspirin   Review of Systems Review of Systems  Neurological: Positive for weakness.  ROS: Statement: All systems negative except as marked or noted in the HPI; Constitutional: Negative for fever and chills. ; ; Eyes: Negative for eye pain, redness and discharge. ; ; ENMT: Negative for ear pain, hoarseness, nasal congestion, sinus pressure and sore throat. ; ; Cardiovascular: Negative for chest pain, palpitations, diaphoresis, dyspnea and peripheral edema. ; ; Respiratory: +cough. Negative for wheezing and stridor. ; ; Gastrointestinal: Negative for nausea, vomiting, diarrhea, abdominal pain, blood in stool, hematemesis, jaundice and rectal bleeding. . ; ; Genitourinary: Negative for dysuria, flank pain and hematuria. ; ; Musculoskeletal: Negative for back pain and neck pain. Negative for swelling  and trauma.; ; Skin: +left forearm abrasion. Negative for pruritus, rash, blisters, bruising and skin lesion.; ; Neuro: +LLE weakness, lightheadedness. Negative for headache and neck stiffness. Negative for altered level of consciousness, altered mental status, paresthesias, involuntary movement, seizure and syncope.       Physical Exam Updated Vital Signs BP 100/70 (BP Location: Left Arm)   Pulse 89   Temp (!) 102 F (38.9 C) (Rectal)   Resp (!) 26   Ht 6' (1.829 m)   Wt 68 kg   SpO2 (!) 86%   BMI 20.33 kg/m    Patient Vitals for the past 24 hrs:  BP Temp Temp src Pulse Resp SpO2 Height Weight  12/29/17 0900 125/61 - - 78 (!) 25 98 % - -  12/29/17 0856 - (!) 100.9 F (38.3 C) Rectal - - - - -  12/29/17 0830 109/71 - - 81 19 96 % - -  12/29/17 0749 100/70 (!) 102 F (38.9 C) Rectal 89 (!) 26 (!) 86 % - -  12/29/17 0745 100/70 - - 89 - (!) 87 % - -  12/29/17 0744 - - - - - - 6' (1.829 m) 68 kg     Physical Exam 0800: Physical examination:  Nursing notes reviewed; Vital signs and O2 SAT reviewed;  Constitutional: Well developed, Well nourished, In no acute distress; Head:  Normocephalic, atraumatic; Eyes: EOMI, PERRL, No scleral icterus; ENMT: Mouth and pharynx normal, Mucous membranes dry; Neck: Supple, Full range of motion, No lymphadenopathy; Cardiovascular: Regular rate and rhythm, No gallop; Respiratory: Breath sounds clear & equal bilaterally, No wheezes.  Speaking full sentences with ease, Normal respiratory effort/excursion; Chest: Nontender, Movement normal; Abdomen: Soft, Nontender, Nondistended, Normal bowel sounds; Genitourinary: No CVA tenderness; Extremities: Peripheral pulses normal, Pelvis stable. NT left hip/knee/ankle/foot, no deformity. No edema, No calf edema or asymmetry. +LUE superficial wound abrasion..; Neuro: AA&Ox3, +HOH, otherwise major CN grossly intact. No facial droop. Speech clear.  Strength 4/5 LLE, chronic LUE weakness, otherwise no gross focal motor  deficits in extremities.; Skin: Color normal, Warm, Dry.   ED Treatments / Results  Labs (all labs ordered are listed, but only abnormal results are displayed)   EKG EKG Interpretation  Date/Time:  Tuesday December 29 2017 07:48:44 EST Ventricular Rate:  90 PR Interval:    QRS Duration: 94 QT Interval:  360 QTC Calculation: 441 R Axis:   -31 Text Interpretation:  Ectopic atrial rhythm Left axis deviation When compared with ECG of 11/25/2017 No significant change was found Confirmed by Francine Graven [phone]) on 12/29/2017 8:10:39 AM   Radiology   Procedures Procedures (including critical care time)  Medications Ordered in ED Medications  0.9 %  sodium chloride infusion (has no administration in time range)  ceFEPIme (MAXIPIME) 2 g in sodium chloride 0.9 % 100 mL IVPB (has no administration in time range)  metroNIDAZOLE (FLAGYL) IVPB 500 mg (has no administration in time range)  acetaminophen (TYLENOL) tablet 650 mg (has no administration in time range)  vancomycin (VANCOCIN) 1,250 mg in sodium chloride 0.9 % 250 mL IVPB (has no administration in time range)    Followed by  vancomycin (VANCOCIN) IVPB 750 mg/150 ml premix (has no administration in time range)  sodium chloride 0.9 % bolus 1,000 mL (has no administration in time range)  0.9 %  sodium chloride infusion (has no administration in time range)     Initial Impression / Assessment and Plan / ED Course  I have reviewed the triage vital signs and the nursing notes.  Pertinent labs & imaging results that were available during my care of the patient were reviewed by me and considered in my medical decision making (see chart for details).  MDM Reviewed: previous chart, nursing note and vitals Reviewed previous: labs and ECG Interpretation: labs, ECG, x-ray and CT scan Total time providing critical care: 30-74 minutes. This excludes time spent performing separately reportable procedures and services. Consults:  admitting MD   CRITICAL CARE Performed by: Francine Graven Total critical care time: 35 minutes Critical care time was exclusive of separately billable procedures and treating other patients. Critical care was necessary to treat or prevent imminent or life-threatening deterioration. Critical care was time spent personally by me on the following activities: development of treatment plan with patient and/or surrogate as well as nursing, discussions with consultants, evaluation of patient's response to treatment, examination of patient, obtaining history from patient or surrogate, ordering and performing treatments and interventions, ordering and review of laboratory studies, ordering and review of radiographic studies, pulse oximetry and re-evaluation of patient's condition.   Results for orders placed or performed during the hospital encounter of 12/29/17  Comprehensive metabolic panel  Result Value Ref Range   Sodium 135 135 - 145 mmol/L   Potassium 3.9 3.5 - 5.1 mmol/L   Chloride 105 98 - 111 mmol/L   CO2 22 22 - 32 mmol/L   Glucose, Bld 116 (H) 70 - 99 mg/dL   BUN 20 8 - 23 mg/dL   Creatinine, Ser 1.07 0.61 - 1.24 mg/dL   Calcium 8.6 (L) 8.9 - 10.3 mg/dL   Total Protein 7.4 6.5 - 8.1 g/dL   Albumin 3.4 (L) 3.5 - 5.0 g/dL   AST 15 15 - 41 U/L   ALT 12 0 - 44 U/L   Alkaline Phosphatase 109 38 - 126 U/L   Total Bilirubin 1.2 0.3 - 1.2 mg/dL   GFR calc non Af Amer >60 >60 mL/min   GFR calc Af Amer >60 >60 mL/min   Anion  gap 8 5 - 15  CBC with Differential  Result Value Ref Range   WBC 37.6 (H) 4.0 - 10.5 K/uL   RBC 4.98 4.22 - 5.81 MIL/uL   Hemoglobin 14.7 13.0 - 17.0 g/dL   HCT 45.8 39.0 - 52.0 %   MCV 92.0 80.0 - 100.0 fL   MCH 29.5 26.0 - 34.0 pg   MCHC 32.1 30.0 - 36.0 g/dL   RDW 17.1 (H) 11.5 - 15.5 %   Platelets 435 (H) 150 - 400 K/uL   nRBC 0.0 0.0 - 0.2 %   Neutrophils Relative % 89 %   Neutro Abs 33.1 (H) 1.7 - 7.7 K/uL   Lymphocytes Relative 6 %   Lymphs Abs 2.3  0.7 - 4.0 K/uL   Monocytes Relative 4 %   Monocytes Absolute 1.7 (H) 0.1 - 1.0 K/uL   Eosinophils Relative 0 %   Eosinophils Absolute 0.0 0.0 - 0.5 K/uL   Basophils Relative 0 %   Basophils Absolute 0.2 (H) 0.0 - 0.1 K/uL   WBC Morphology VACUOLATED NEUTROPHILS    Immature Granulocytes 1 %   Abs Immature Granulocytes 0.43 (H) 0.00 - 0.07 K/uL  Troponin I - Once  Result Value Ref Range   Troponin I 0.09 (HH) <0.03 ng/mL  CK  Result Value Ref Range   Total CK 42 (L) 49 - 397 U/L  Ethanol  Result Value Ref Range   Alcohol, Ethyl (B) <10 <10 mg/dL   Dg Chest 1 View Result Date: 12/29/2017 CLINICAL DATA:  Cough, fever, weakness, history of lung carcinoma, smoking history EXAM: CHEST  1 VIEW COMPARISON:  CT chest of 12/08/2017 and chest x-ray of 11/25/2016 FINDINGS: The left lower lobe mass is not as well seen by chest x-ray. However there is increasing parenchymal opacity in the left mid and lower lung consistent with atelectasis and pneumonia. Chronic markings remain at the lung bases. No pleural effusion is seen. Mediastinal and hilar contours otherwise are unremarkable with some prominence again noted of the left infrahilar region. Heart size is stable. No bony abnormality is seen. IMPRESSION: 1. Increasing opacity in the left mid and lower lung most consistent with atelectasis and pneumonia possibly as a result of the central lesion noted by CT. 2. Fullness of the left infrahilar region as a result of the previously demonstrated mass by CT chest. Electronically Signed   By: Ivar Drape M.D.   On: 12/29/2017 08:41   Ct Head Wo Contrast Result Date: 12/29/2017 CLINICAL DATA:  Weakness, head injury after fall. EXAM: CT HEAD WITHOUT CONTRAST CT CERVICAL SPINE WITHOUT CONTRAST TECHNIQUE: Multidetector CT imaging of the head and cervical spine was performed following the standard protocol without intravenous contrast. Multiplanar CT image reconstructions of the cervical spine were also generated.  COMPARISON:  CT scan of November 25, 2017. FINDINGS: CT HEAD FINDINGS Brain: Old right frontal infarction is noted. Mild chronic ischemic white matter disease is noted. No mass effect or midline shift is noted. Ventricular size is within normal limits. There is no evidence of mass lesion, hemorrhage or acute infarction. Vascular: No hyperdense vessel or unexpected calcification. Skull: Normal. Negative for fracture or focal lesion. Sinuses/Orbits: Right maxillary sinusitis is noted. Other: None. CT CERVICAL SPINE FINDINGS Alignment: Normal. Skull base and vertebrae: No acute fracture. No primary bone lesion or focal pathologic process. Soft tissues and spinal canal: No prevertebral fluid or swelling. No visible canal hematoma. Disc levels: Mild degenerative disc disease is noted at C5-6 and C6-7 with minimal  anterior osteophyte formation. Upper chest: No acute abnormality seen. Other: Minimal degenerative changes are seen involving the right-sided posterior facet joints. 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 Right maxillary sinusitis. No acute intracranial abnormality seen. Mild multilevel degenerative disc disease. No acute abnormality seen in the cervical spine. Electronically Signed   By: Marijo Conception, M.D.   On: 12/29/2017 08:57   Ct Cervical Spine Wo Contrast Result Date: 12/29/2017 CLINICAL DATA:  Weakness, head injury after fall. EXAM: CT HEAD WITHOUT CONTRAST CT CERVICAL SPINE WITHOUT CONTRAST TECHNIQUE: Multidetector CT imaging of the head and cervical spine was performed following the standard protocol without intravenous contrast. Multiplanar CT image reconstructions of the cervical spine were also generated. COMPARISON:  CT scan of November 25, 2017. FINDINGS: CT HEAD FINDINGS Brain: Old right frontal infarction is noted. Mild chronic ischemic white matter disease is noted. No mass effect or midline shift is noted. Ventricular size is within normal  limits. There is no evidence of mass lesion, hemorrhage or acute infarction. Vascular: No hyperdense vessel or unexpected calcification. Skull: Normal. Negative for fracture or focal lesion. Sinuses/Orbits: Right maxillary sinusitis is noted. Other: None. CT CERVICAL SPINE FINDINGS Alignment: Normal. Skull base and vertebrae: No acute fracture. No primary bone lesion or focal pathologic process. Soft tissues and spinal canal: No prevertebral fluid or swelling. No visible canal hematoma. Disc levels: Mild degenerative disc disease is noted at C5-6 and C6-7 with minimal anterior osteophyte formation. Upper chest: No acute abnormality seen. Other: Minimal degenerative changes are seen involving the right-sided posterior facet joints. 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 Right maxillary sinusitis. No acute intracranial abnormality seen. Mild multilevel degenerative disc disease. No acute abnormality seen in the cervical spine. Electronically Signed   By: Marijo Conception, M.D.   On: 12/29/2017 08:57   I-stat lactic acid 1.24   0925:  Code sepsis called on pt arrival. APAP PO ordered after RN bedside swallow screen. Soft BP slowly improving with IVF NS 1L bolus. IV abx started after [facility] and UC obtained. I-stat lactic acid 1.24 (did not cross into Epic).  Pt remains awake/alert, resps without distress after neb and while on O2 N/C.  Pt's daughter states pt has hx of "knees issues" "knees give out" and that may have been why he fell, but pt stated to me his LLE was "weak" and he did not have hip/knee pain: CT-H with old stroke, pt not code stroke d/t waking up with symptoms/unclear LKW. Dx and testing d/w pt and family.  Questions answered.  Verb understanding, agreeable to admit.  T/C returned from [facility] Dr. Wynetta Emery, case discussed, including:  HPI, pertinent PM/SHx, VS/PE, dx testing, ED course and treatment:  Agreeable to admit.          Final Clinical  Impressions(s) / ED Diagnoses   Final diagnoses:  None    ED Discharge Orders    None       Francine Graven, DO 01/02/18 1610

## 2017-12-29 NOTE — ED Notes (Signed)
hospitalist in with pt at this time. Daughter at bedside. Fever decreased. Pt feeling better.

## 2017-12-29 NOTE — Plan of Care (Signed)
  Problem: Acute Rehab PT Goals(only PT should resolve) Goal: Patient Will Transfer Sit To/From Stand Outcome: Progressing Flowsheets (Taken 12/29/2017 1609) Patient will transfer sit to/from stand: with supervision Goal: Pt Will Transfer Bed To Chair/Chair To Bed Outcome: Progressing Flowsheets (Taken 12/29/2017 1609) Pt will Transfer Bed to Chair/Chair to Bed: with supervision Goal: Pt Will Ambulate Outcome: Progressing Flowsheets (Taken 12/29/2017 1609) Pt will Ambulate: with min guard assist; 50 feet; with rolling walker; with cane   4:09 PM, 12/29/17 Lonell Grandchild, MPT Physical Therapist with [facility] [phone] office [phone] mobile phone

## 2017-12-30 DIAGNOSIS — I2510 Atherosclerotic heart disease of native coronary artery without angina pectoris: Secondary | ICD-10-CM

## 2017-12-30 DIAGNOSIS — C3490 Malignant neoplasm of unspecified part of unspecified bronchus or lung: Secondary | ICD-10-CM

## 2017-12-30 DIAGNOSIS — Z7189 Other specified counseling: Secondary | ICD-10-CM

## 2017-12-30 DIAGNOSIS — J0100 Acute maxillary sinusitis, unspecified: Secondary | ICD-10-CM | POA: Diagnosis present

## 2017-12-30 DIAGNOSIS — Z515 Encounter for palliative care: Secondary | ICD-10-CM

## 2017-12-30 DIAGNOSIS — R29898 Other symptoms and signs involving the musculoskeletal system: Secondary | ICD-10-CM

## 2017-12-30 DIAGNOSIS — C3492 Malignant neoplasm of unspecified part of left bronchus or lung: Secondary | ICD-10-CM

## 2017-12-30 LAB — BASIC METABOLIC PANEL
Anion gap: 6 (ref 5–15)
BUN: 17 mg/dL (ref 8–23)
CO2: 22 mmol/L (ref 22–32)
Calcium: 8.1 mg/dL — ABNORMAL LOW (ref 8.9–10.3)
Chloride: 109 mmol/L (ref 98–111)
Creatinine, Ser: 0.82 mg/dL (ref 0.61–1.24)
GFR calc Af Amer: >60 mL/min (ref >60)
GFR calc non Af Amer: >60 mL/min (ref >60)
Glucose, Bld: 80 mg/dL (ref 70–99)
Potassium: 3.8 mmol/L (ref 3.5–5.1)
SODIUM: 137 mmol/L (ref 135–145)

## 2017-12-30 LAB — CBC WITH DIFFERENTIAL/PLATELET
Abs Immature Granulocytes: 0.17 10*3/uL — ABNORMAL HIGH (ref 0.00–0.07)
Basophils Absolute: 0.1 10*3/uL (ref 0.0–0.1)
Basophils Relative: 0 %
Eosinophils Absolute: 0.2 10*3/uL (ref 0.0–0.5)
Eosinophils Relative: 1 %
HCT: 40.2 % (ref 39.0–52.0)
Hemoglobin: 12.8 g/dL — ABNORMAL LOW (ref 13.0–17.0)
IMMATURE GRANULOCYTES: 1 %
Lymphocytes Relative: 9 %
Lymphs Abs: 2.3 10*3/uL (ref 0.7–4.0)
MCH: 29.9 pg (ref 26.0–34.0)
MCHC: 31.8 g/dL (ref 30.0–36.0)
MCV: 93.9 fL (ref 80.0–100.0)
Monocytes Absolute: 1.4 10*3/uL — ABNORMAL HIGH (ref 0.1–1.0)
Monocytes Relative: 6 %
Neutro Abs: 20.8 10*3/uL — ABNORMAL HIGH (ref 1.7–7.7)
Neutrophils Relative %: 83 %
Platelets: 361 10*3/uL (ref 150–400)
RBC: 4.28 MIL/uL (ref 4.22–5.81)
RDW: 16.8 % — ABNORMAL HIGH (ref 11.5–15.5)
WBC: 24.9 10*3/uL — AB (ref 4.0–10.5)
nRBC: 0 % (ref 0.0–0.2)

## 2017-12-30 LAB — HIV ANTIBODY (ROUTINE TESTING W REFLEX): HIV Screen 4th Generation wRfx: NONREACTIVE

## 2017-12-30 MED ORDER — ALBUTEROL SULFATE (2.5 MG/3ML) 0.083% IN NEBU: 2.5000 mg | INHALATION_SOLUTION | RESPIRATORY_TRACT | Status: DC | PRN

## 2017-12-30 MED ORDER — IPRATROPIUM-ALBUTEROL 0.5-2.5 (3) MG/3ML IN SOLN
3.0000 mL | Freq: Three times a day (TID) | RESPIRATORY_TRACT | Status: DC
  Administered 2017-12-31 – 2018-01-01 (×5): 3 mL via RESPIRATORY_TRACT
  Filled 2017-12-30 (×6): qty 3

## 2017-12-30 NOTE — Progress Notes (Signed)
Physical Therapy Treatment Patient Details Name: Bradley [name] MRN: [id number] DOB: Jun 27, 1940 Today's Date: 12/30/2017    History of Present Illness  Bradley [name] is a 77 y.o. male with newly diagnosed non-small cell lung cancer who recently had a lung biopsy done approximately 2 weeks ago presents to ED by EMS complaining of generalized weakness.  His symptoms have been ongoing for about 2 weeks.  The patient says that he became acutely more weak last night.  He fell at around 2 AM this morning.  The patient says that he continues to smoke cigarettes and has smoked since the age of 89.  The patient says that he is planning to stop smoking at this time.  The patient says that he has decided that he does not want to have chemotherapy or radiation treatment.  He says that he does want to see the oncologist to discuss his prognosis.  However, he is not interested in any further treatment after that.  The patient has had a productive cough of greenish sputum worsened over the past several days.  He has also had some fever and chills.  His daughter says that he has generally been weak over the past 2 weeks and that has gotten worse.  He has no nausea or vomiting.  He has occasional chest pains.    PT Comments    Patient demonstrates increased endurance/distance for gait training with mostly 3 point gait pattern, no loss of balance, but limited due to onset of LLE pain after approximately 3-4 minutes of walking and had to stop due to fear of LLE giving way.  Patient on room air during ambulation with O2 saturation between 91-93%, able to maintain above 93% after sitting up at bedside and patient left on room air - RN notified.  Patient will benefit from continued physical therapy in hospital and recommended venue below to increase strength, balance, endurance for safe ADLs and gait.   Follow Up Recommendations  SNF;Supervision for mobility/OOB;Supervision - Intermittent     Equipment Recommendations   None recommended by PT    Recommendations for Other Services       Precautions / Restrictions Precautions Precautions: Fall Restrictions Weight Bearing Restrictions: No    Mobility  Bed Mobility Overal bed mobility: Modified Independent                Transfers Overall transfer level: Needs assistance Equipment used: Straight cane Transfers: Sit to/from Stand;Stand Pivot Transfers Sit to Stand: Supervision Stand pivot transfers: Min guard       General transfer comment: slightly labord movement  Ambulation/Gait Ambulation/Gait assistance: Min guard Gait Distance (Feet): 40 Feet Assistive device: Straight cane Gait Pattern/deviations: Step-to pattern;Decreased step length - left;Decreased stance time - right;Decreased stride length Gait velocity: decreased   General Gait Details: increased endurance/distance for ambulation with mostly 3 point gait pattern using SPC, no loss of balance, limited mostly due to c/o LLE weakness/pain   Stairs             Wheelchair Mobility    Modified Rankin (Stroke Patients Only)       Balance Overall balance assessment: Needs assistance Sitting-balance support: Feet supported;No upper extremity supported Sitting balance-Leahy Scale: Good     Standing balance support: Single extremity supported;During functional activity Standing balance-Leahy Scale: Fair Standing balance comment: using SPC                            Cognition Arousal/Alertness: Awake/alert  Behavior During Therapy: WFL for tasks assessed/performed Overall Cognitive Status: Within Functional Limits for tasks assessed                                        Exercises General Exercises - Lower Extremity Long Arc Quad: Seated;AROM;Strengthening;Both;10 reps Hip Flexion/Marching: Seated;AROM;Strengthening;Both;10 reps Toe Raises: Seated;AROM;Strengthening;Both;10 reps Heel Raises: Seated;AROM;Strengthening;Both;10  reps    General Comments        Pertinent Vitals/Pain Pain Assessment: Faces Pain Score: 2  Pain Location: LLE Pain Descriptors / Indicators: Discomfort;Sore Pain Intervention(s): Limited activity within patient's tolerance;Monitored during session    Home Living                      Prior Function            PT Goals (current goals can now be found in the care plan section) Acute Rehab PT Goals Patient Stated Goal: return home with family to assist PT Goal Formulation: With patient Time For Goal Achievement: 01/12/18 Potential to Achieve Goals: Good Progress towards PT goals: Progressing toward goals    Frequency    Min 3X/week      PT Plan Current plan remains appropriate    Co-evaluation              AM-PAC PT "6 Clicks" Mobility   Outcome Measure  Help needed turning from your back to your side while in a flat bed without using bedrails?: None Help needed moving from lying on your back to sitting on the side of a flat bed without using bedrails?: None Help needed moving to and from a bed to a chair (including a wheelchair)?: A Little Help needed standing up from a chair using your arms (e.g., wheelchair or bedside chair)?: A Little Help needed to walk in hospital room?: A Little Help needed climbing 3-5 steps with a railing? : A Lot 6 Click Score: 19    End of Session Equipment Utilized During Treatment: Gait belt Activity Tolerance: Patient limited by fatigue;Patient tolerated treatment well Patient left: in bed;with call bell/phone within reach;with bed alarm set Nurse Communication: Mobility status PT Visit Diagnosis: Unsteadiness on feet (R26.81);Other abnormalities of gait and mobility (R26.89);Muscle weakness (generalized) (M62.81)     Time: 4315-4008 PT Time Calculation (min) (ACUTE ONLY): 31 min  Charges:  $Gait Training: 8-22 mins $Therapeutic Exercise: 8-22 mins                     3:43 PM, 12/30/17 Lonell Grandchild,  MPT Physical Therapist with [facility] [phone] office [phone] mobile phone

## 2017-12-30 NOTE — Progress Notes (Signed)
PROGRESS NOTE    Bradley [name]  YHC:[id number]  DOB: 06-24-1940  DOA: 12/29/2017 PCP: Jani Gravel, MD   Brief Admission Hx: 77 y.o. male with newly diagnosed non-small cell lung cancer who recently had a lung biopsy done approximately 2 weeks ago presents to ED by EMS complaining of generalized weakness.   Patient was admitted with a nosocomial pneumonia.  MDM/Assessment & Plan:   1. Nosocomial pneumonia - improving, continue IV antibiotics and respiratory support.  Continue broad spectrum antibiotics for now pending sputum culture and blood culture findings.   2. Newly diagnosed non small cell lung cancer - Pt was seen by the oncologist to discuss treatment.  He plans to follow up with oncology outpatient to further discuss stage and treatment.    3. Acute right maxillary sinusitis - He is on IV antibiotics at this time.  4. Syncope and collapse - suspect orthostatic hypotension as cause, PT recommending SNF placement.  5. Leukocytosis-WBC trending down.  Markedly elevated WBC likely related to pneumonia and recent biopsy and cancer. 6. Tobacco abuse- I did speak with the patient about his ongoing smoking however at this time he declines to stop smoking and says that he already has lung cancer and he is going to enjoy the rest of his life. 7. Essential hypertension-his blood pressures are soft now and we are holding any blood pressure medications.  Will readdress if needed. 8. I discussed CODE STATUS with the patient and he was very clear that he would be DNR/DNI.  He says that he understands that at his advanced age he would likely not survive being intubated or having CPR.  DVT Prophylaxis: Lovenox Code Status: DNR Family Communication: Daughter at bedside Disposition Plan: anticipating SNF placement in 1-2 days, if he declines then home with home health Antibiotics:   Cefepime 12/3 >  Vancomycin 12/3 >  Subjective: Patient says he started to feel better today.  He is having  more of a productive cough this morning.  He is having no shortness of breath.  Objective: Vitals:   12/29/17 2131 12/30/17 0520 12/30/17 0521 12/30/17 0753  BP: 105/65 112/65    Pulse: 72 73 74   Resp:      Temp: 97.6 F (36.4 C) 98.4 F (36.9 C)    TempSrc: Oral Oral    SpO2: 96% 90% 91% 92%  Weight:      Height:        Intake/Output Summary (Last 24 hours) at 12/30/2017 1317 Last data filed at 12/30/2017 0300 Gross per 24 hour  Intake 1009.38 ml  Output -  Net 1009.38 ml   Filed Weights   12/29/17 0744  Weight: 68 kg     REVIEW OF SYSTEMS  As per history otherwise all reviewed and reported negative  Exam:  General exam: thin male, chronically ill appearing, NAD.  Respiratory system: rales LLL. No increased work of breathing. Cardiovascular system: S1 & S2 heard, RRR. No JVD, murmurs, gallops, clicks or pedal edema. Gastrointestinal system: Abdomen is nondistended, soft and nontender. Normal bowel sounds heard. Central nervous system: Alert and oriented. No focal neurological deficits. Extremities: no CCE.  Data Reviewed: Basic Metabolic Panel: Recent Labs  Lab 12/29/17 0752 12/30/17 0505  NA 135 137  K 3.9 3.8  CL 105 109  CO2 22 22  GLUCOSE 116* 80  BUN 20 17  CREATININE 1.07 0.82  CALCIUM 8.6* 8.1*   Liver Function Tests: Recent Labs  Lab 12/29/17 0752  AST 15  ALT 12  ALKPHOS 109  BILITOT 1.2  PROT 7.4  ALBUMIN 3.4*   No results for input(s): LIPASE, AMYLASE in the last 168 hours. No results for input(s): AMMONIA in the last 168 hours. CBC: Recent Labs  Lab 12/29/17 0752 12/30/17 0505  WBC 37.6* 24.9*  NEUTROABS 33.1* 20.8*  HGB 14.7 12.8*  HCT 45.8 40.2  MCV 92.0 93.9  PLT 435* 361   Cardiac Enzymes: Recent Labs  Lab 12/29/17 0800  CKTOTAL 42*  TROPONINI 0.09*   CBG (last 3)  No results for input(s): GLUCAP in the last 72 hours. Recent Results (from the past 240 hour(s))  Culture, blood (Routine x 2)     Status: None  (Preliminary result)   Collection Time: 12/29/17  8:00 AM  Result Value Ref Range Status   Specimen Description BLOOD RIGHT ARM  Final   Special Requests   Final    BOTTLES DRAWN AEROBIC AND ANAEROBIC Blood Culture adequate volume   Culture   Final    NO GROWTH < 24 HOURS Performed at [facility], [address], [place], [place] [address]    Report Status PENDING  Incomplete  Culture, blood (Routine x 2)     Status: None (Preliminary result)   Collection Time: 12/29/17  8:05 AM  Result Value Ref Range Status   Specimen Description BLOOD RIGHT HAND  Final   Special Requests   Final    BOTTLES DRAWN AEROBIC AND ANAEROBIC Blood Culture adequate volume   Culture   Final    NO GROWTH < 24 HOURS Performed at [facility], [address], [place], [place] [address]    Report Status PENDING  Incomplete     Studies: Dg Chest 1 View  Result Date: 12/29/2017 CLINICAL DATA:  Cough, fever, weakness, history of lung carcinoma, smoking history EXAM: CHEST  1 VIEW COMPARISON:  CT chest of 12/08/2017 and chest x-ray of 11/25/2016 FINDINGS: The left lower lobe mass is not as well seen by chest x-ray. However there is increasing parenchymal opacity in the left mid and lower lung consistent with atelectasis and pneumonia. Chronic markings remain at the lung bases. No pleural effusion is seen. Mediastinal and hilar contours otherwise are unremarkable with some prominence again noted of the left infrahilar region. Heart size is stable. No bony abnormality is seen. IMPRESSION: 1. Increasing opacity in the left mid and lower lung most consistent with atelectasis and pneumonia possibly as a result of the central lesion noted by CT. 2. Fullness of the left infrahilar region as a result of the previously demonstrated mass by CT chest. Electronically Signed   By: Ivar Drape M.D.   On: 12/29/2017 08:41   Ct Head Wo Contrast  Result Date: 12/29/2017 CLINICAL DATA:  Weakness, head injury after fall. EXAM: CT HEAD  WITHOUT CONTRAST CT CERVICAL SPINE WITHOUT CONTRAST TECHNIQUE: Multidetector CT imaging of the head and cervical spine was performed following the standard protocol without intravenous contrast. Multiplanar CT image reconstructions of the cervical spine were also generated. COMPARISON:  CT scan of November 25, 2017. FINDINGS: CT HEAD FINDINGS Brain: Old right frontal infarction is noted. Mild chronic ischemic white matter disease is noted. No mass effect or midline shift is noted. Ventricular size is within normal limits. There is no evidence of mass lesion, hemorrhage or acute infarction. Vascular: No hyperdense vessel or unexpected calcification. Skull: Normal. Negative for fracture or focal lesion. Sinuses/Orbits: Right maxillary sinusitis is noted. Other: None. CT CERVICAL SPINE FINDINGS Alignment: Normal. Skull base and vertebrae:  No acute fracture. No primary bone lesion or focal pathologic process. Soft tissues and spinal canal: No prevertebral fluid or swelling. No visible canal hematoma. Disc levels: Mild degenerative disc disease is noted at C5-6 and C6-7 with minimal anterior osteophyte formation. Upper chest: No acute abnormality seen. Other: Minimal degenerative changes are seen involving the right-sided posterior facet joints. 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 Right maxillary sinusitis. No acute intracranial abnormality seen. Mild multilevel degenerative disc disease. No acute abnormality seen in the cervical spine. Electronically Signed   By: Marijo Conception, M.D.   On: 12/29/2017 08:57   Ct Cervical Spine Wo Contrast  Result Date: 12/29/2017 CLINICAL DATA:  Weakness, head injury after fall. EXAM: CT HEAD WITHOUT CONTRAST CT CERVICAL SPINE WITHOUT CONTRAST TECHNIQUE: Multidetector CT imaging of the head and cervical spine was performed following the standard protocol without intravenous contrast. Multiplanar CT image reconstructions of the  cervical spine were also generated. COMPARISON:  CT scan of November 25, 2017. FINDINGS: CT HEAD FINDINGS Brain: Old right frontal infarction is noted. Mild chronic ischemic white matter disease is noted. No mass effect or midline shift is noted. Ventricular size is within normal limits. There is no evidence of mass lesion, hemorrhage or acute infarction. Vascular: No hyperdense vessel or unexpected calcification. Skull: Normal. Negative for fracture or focal lesion. Sinuses/Orbits: Right maxillary sinusitis is noted. Other: None. CT CERVICAL SPINE FINDINGS Alignment: Normal. Skull base and vertebrae: No acute fracture. No primary bone lesion or focal pathologic process. Soft tissues and spinal canal: No prevertebral fluid or swelling. No visible canal hematoma. Disc levels: Mild degenerative disc disease is noted at C5-6 and C6-7 with minimal anterior osteophyte formation. Upper chest: No acute abnormality seen. Other: Minimal degenerative changes are seen involving the right-sided posterior facet joints. 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 Right maxillary sinusitis. No acute intracranial abnormality seen. Mild multilevel degenerative disc disease. No acute abnormality seen in the cervical spine. Electronically Signed   By: Marijo Conception, M.D.   On: 12/29/2017 08:57   Mr Jeri Cos BT Contrast  Result Date: 12/29/2017 CLINICAL DATA:  Initial evaluation for newly diagnosed lung mass, generalized weakness. EXAM: MRI HEAD WITHOUT AND WITH CONTRAST TECHNIQUE: Multiplanar, multiecho pulse sequences of the brain and surrounding structures were obtained without and with intravenous contrast. CONTRAST:  60 cc of Gadavist. COMPARISON:  Prior CT from earlier the same day. FINDINGS: Brain: Diffuse prominence of the CSF containing spaces compatible with generalized age-related cerebral atrophy. Patchy and confluent T2/FLAIR hyperintensity within the periventricular and deep  white matter both cerebral hemispheres most consistent with chronic small vessel ischemic disease, moderate nature. Encephalomalacia with gliosis within the parasagittal anterior right frontal lobe compatible with chronic right PCA territory infarct. Small remote lacunar infarct present within the left thalamus. No abnormal foci of restricted diffusion to suggest acute or subacute ischemia. Gray-white matter differentiation maintained. No foci of susceptibility artifact to suggest acute or chronic intracranial hemorrhage. No mass lesion, midline shift or mass effect. No hydrocephalus. No extra-axial fluid collection. Pituitary gland suprasellar region within normal limits. No abnormal enhancement or findings to suggest intracranial metastatic disease. Vascular: Major intracranial vascular flow voids are maintained. Skull and upper cervical spine: Craniocervical junction normal. Upper cervical spine within normal limits. No focal marrow replacing lesion. Note made of a 11 mm nonenhancing T2 hyperintense lesion at the left postauricular soft tissues (series 7, image 5), indeterminate, but favored to reflect  a small sebaceous cyst. A cystic lymph node could also be considered. Scalp soft tissues demonstrate no other acute abnormality. Sinuses/Orbits: Globes and orbital soft tissues demonstrate no acute finding. Patient status post bilateral ocular lens replacement. Acute right maxillary sinusitis with moderate layering fluid within the right maxillary sinus. Paranasal sinuses are otherwise largely clear. No significant mastoid effusion. Inner ear structures grossly normal. Other: None. IMPRESSION: 1. No acute intracranial abnormality. No evidence for intracranial metastatic disease. 2. 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 3. Underlying atrophy with moderate chronic microvascular ischemic disease. 4. 11 mm cystic lesion within the left postauricular soft  tissues, indeterminate, but favored to reflect a small sebaceous cyst. A possible cystic and/or necrotic lymph node not entirely excluded. Correlation with physical exam recommended. 5. Acute right maxillary sinusitis. Electronically Signed   By: Jeannine Boga M.D.   On: 12/29/2017 20:14   Scheduled Meds: . amLODipine  5 mg Oral Daily  . aspirin EC  81 mg Oral Daily  . benzonatate  100 mg Oral Q8H  . buPROPion  150 mg Oral BID  . celecoxib  200 mg Oral Daily  . chlorhexidine  15 mL Mouth Rinse BID  . enoxaparin (LOVENOX) injection  40 mg Subcutaneous Q24H  . ferrous sulfate  325 mg Oral Daily  . fluticasone  2 spray Each Nare Daily  . gabapentin  300 mg Oral TID  . guaiFENesin  1,200 mg Oral BID  . ipratropium-albuterol  3 mL Nebulization Q6H  . mouth rinse  15 mL Mouth Rinse q12n4p  . metoCLOPramide  5 mg Oral TID AC & HS  . metoprolol succinate  25 mg Oral Daily  . mirabegron ER  25 mg Oral Daily  . mometasone-formoterol  2 puff Inhalation BID  . montelukast  10 mg Oral Daily  . pantoprazole  40 mg Oral Daily  . simvastatin  10 mg Oral q1800  . vitamin C  500 mg Oral Daily   Continuous Infusions: . sodium chloride    . sodium chloride 1,000 mL (12/30/17 0113)  . ceFEPime (MAXIPIME) IV 1 g (12/30/17 0529)  . vancomycin 750 mg (12/30/17 0754)    Principal Problem:   Nosocomial pneumonia Active Problems:   Tobacco abuse   Emphysema, unspecified ([place])   CAD (coronary artery disease), native coronary artery   Essential hypertension   Leukocytosis   Syncope and collapse   Mass of lower lobe of left lung   Non-small cell lung cancer ([place])   Acute maxillary sinusitis   Time spent:   Irwin Brakeman, MD, FAAFP [facility] Pager [phone]  If 7PM-7AM, please contact night-coverage [email or web address] Password [email or web address] 12/30/2017, 1:17 PM    LOS: 1 day

## 2017-12-30 NOTE — Consult Note (Signed)
Consultation Note Date: 12/30/2017   Patient Name: Bradley [name]  DOB: Dec 30, 1940  MRN: [id number]  Age / Sex: 77 y.o., male  PCP: Jani Gravel, MD Referring Physician: Murlean Iba, MD  Reason for Consultation: Establishing goals of care  HPI/Patient Profile: 77 y.o. male male  with past medical history of MI, CAD, HTN, HLD, COPD, smoker, neuropathy, chronic pain admitted on 12/29/2017 with weakness and fall. 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 PET scan 12/08/17 shows central left lower lobe perihilar mass, mild to moderate uptake associated with right paratracheal and right lymph nodes. Oncology following. Palliative medicine consultation for goals of care. Per attending, patient declining treatment for lung cancer.   Clinical Assessment and Goals of Care:  I have reviewed medical records, discussed with care team, and met with patient, daughter Freda Munro), and daughter's boyfriend Marcello Moores) at bedside to discuss diagnosis, [place], EOL wishes, disposition and options.  Introduced Palliative Medicine as specialized medical care for people living with serious illness. It focuses on providing relief from the symptoms and stress of a serious illness. The goal is to improve quality of life for both the patient and the family.  We discussed a brief life review of the patient. 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 He moved back to [place] to live with his daughter a few years ago.   Discussed recent findings of cancer including PET/CT scans in the last two months. Discussed events leading up to hospitalization and course of hospital diagnoses and interventions.   I attempted to elicit values and goals of care important to the patient. The patient shares that he plans to follow up  with oncology outpatient for repeat scan (to see if pneumonia has cleared versus metastatic disease) but has already made his decision that he does NOT wish to pursue aggressive treatment for cancer. He shares that he worked in [place] and had many friends diagnosed with cancer and pursued treatment, that killed them faster. He shares that the 'chest pain' started 15 years ago and was always contributed to his heart but this time, CT was performed and cancer was found. He jokes that he may live "15 more years" without pursuing treatment options.   Stephens is very clear on "quality of life" over aggressive treatment options that will make him feel miserable. He speaks of if his time is limited, he wishes for quality time with his family in his home. He tells me he has "lived a good life" and "full" life and "when it's my time to go, it's my time to go." He is placing this "in God's hands."   Advanced directives, concepts specific to code status, and artifical feeding and hydration were discussed. Delmore confirms his decision that was made in the ER for NO resuscitation if his heart were to stop. Freda Munro is his only daughter and wishes for her to be HCPOA. Introduced and completed MOST form with  Paolo and his daughter. DNR/DNI, limited interventions including BiPAP/CPAP if necessary, IVF/ABX if indicated, feeding tube for defined trial period. He is very clear that he would NOT want his life prolonged by artificial means. Daughter respects his decisions. Durable DNR completed.   Briefly introduced concept and philosophy of hospice services in the future when appropriate.   Discussed discharge plan. Garrette is willing to pursue SNF for rehab, hoping this will help build his strength and ability to return home with daughter. Freda Munro also has health concerns and unable to lift her father. [place] is motivated to work with rehab. Appetite is great.   Questions and concerns were addressed. Fritz Pickerel appreciative of my visit.  PMT contact information given.     SUMMARY OF RECOMMENDATIONS    Great initial palliative discussion with patient and daughter at bedside.   Patient is very clear on his wishes against heroic measures at EOL including DNR/DNI. Introduced and completed MOST form: DNR/DNI, limited interventions including BiPAP/CPAP, IVF/ABX if indicated, and short term feeding tube if indicated. Durable DNR completed. Copies given to daughter.   Continue to treat the treatable.  Patient planning to follow-up with outpatient oncology but will most likely not pursue oncology treatment recommendations. Patient clear on importance of "quality of life" over quantity.   Patient willing to discharge to SNF for rehab after hospitalization. SW following.   Chaplain consulted for AD packet.   Code Status/Advance Care Planning:  DNR  Symptom Management:   Per attending  Palliative Prophylaxis:   Bowel Regimen and Frequent Pain Assessment  Psycho-social/Spiritual:   Desire for further Chaplaincy support:yes  Additional Recommendations: Caregiving  Support/Resources, Compassionate Wean Education and Education on Hospice  Prognosis:   Unable to determine  Discharge Planning: SNF for rehab     Primary Diagnoses: Present on Admission: . Nosocomial pneumonia . Leukocytosis . Essential hypertension . CAD (coronary artery disease), native coronary artery . Syncope and collapse . Mass of lower lobe of left lung . Emphysema, unspecified ([place]) . Tobacco abuse . Non-small cell lung cancer ([place]) . Acute maxillary sinusitis   I have reviewed the medical record, interviewed the patient and family, and examined the patient. The following aspects are pertinent.  Past Medical History:  Diagnosis Date  . Cancer ([place])    lung  . Chronic lower back pain   . Chronic pain    pain management  . Contact with stonefish as cause of accidental injury 1954   "stung across my left hand in [place]; LUE weaker  since"  . COPD (chronic obstructive pulmonary disease) ([place])   . Coronary artery disease   . Dyspnea    with exertion  . High cholesterol   . History of blood transfusion 1943  . History of kidney stones    passed  . Hypertension   . Mass of left lung   . Migraine    "none since the 1990s" (09/11/2016)  . Myocardial infarction ([place]) 01/20/1993; 02/28/1993  . Neuromuscular disorder ([facility])    neuropathy left  . Pneumonia    "several times" (09/11/2016)  . Tobacco abuse    Social History   Socioeconomic History  . Marital status: Widowed    Spouse name: Not on file  . Number of children: Not on file  . Years of education: Not on file  . Highest education level: Not on file  Occupational History  . Not on file  Social Needs  . Financial resource strain: Not on file  . Food insecurity:  Worry: Not on file    Inability: Not on file  . Transportation needs:    Medical: Not on file    Non-medical: Not on file  Tobacco Use  . Smoking status: Current Every Day Smoker    Packs/day: 0.40    Years: 70.00    Pack years: 28.00    Types: Cigarettes  . Smokeless tobacco: Never Used  . Tobacco comment: smokes 8 a day  Substance and Sexual Activity  . Alcohol use: No    Comment: 11/26/2017  "drank from the age of 8 til 03/05/1993"  . Drug use: Not Currently    Comment: 11/26/2017 "used some drugs when I was young"  . Sexual activity: Not Currently  Lifestyle  . Physical activity:    Days per week: Not on file    Minutes per session: Not on file  . Stress: Not on file  Relationships  . Social connections:    Talks on phone: Not on file    Gets together: Not on file    Attends religious service: Not on file    Active member of club or organization: Not on file    Attends meetings of clubs or organizations: Not on file    Relationship status: Not on file  Other Topics Concern  . Not on file  Social History Narrative  . Not on file   Family History  Adopted: Yes  Problem  Relation Age of Onset  . Cancer Mother   . Obesity Father    Scheduled Meds: . amLODipine  5 mg Oral Daily  . aspirin EC  81 mg Oral Daily  . benzonatate  100 mg Oral Q8H  . buPROPion  150 mg Oral BID  . celecoxib  200 mg Oral Daily  . chlorhexidine  15 mL Mouth Rinse BID  . enoxaparin (LOVENOX) injection  40 mg Subcutaneous Q24H  . ferrous sulfate  325 mg Oral Daily  . fluticasone  2 spray Each Nare Daily  . gabapentin  300 mg Oral TID  . guaiFENesin  1,200 mg Oral BID  . ipratropium-albuterol  3 mL Nebulization Q6H  . mouth rinse  15 mL Mouth Rinse q12n4p  . metoCLOPramide  5 mg Oral TID AC & HS  . metoprolol succinate  25 mg Oral Daily  . mirabegron ER  25 mg Oral Daily  . mometasone-formoterol  2 puff Inhalation BID  . montelukast  10 mg Oral Daily  . pantoprazole  40 mg Oral Daily  . simvastatin  10 mg Oral q1800  . vitamin C  500 mg Oral Daily   Continuous Infusions: . sodium chloride    . sodium chloride 1,000 mL (12/30/17 0113)  . ceFEPime (MAXIPIME) IV 1 g (12/30/17 0529)  . vancomycin 750 mg (12/30/17 0754)   PRN Meds:.sodium chloride, nitroGLYCERIN, oxyCODONE Medications Prior to Admission:  Prior to Admission medications   Medication Sig Start Date End Date Taking? Authorizing Provider  ADVAIR HFA 115-21 MCG/ACT inhaler Inhale 2 puffs into the lungs every 12 (twelve) hours. 01/29/16  Yes [provider]  amLODipine (NORVASC) 5 MG tablet TAKE 1 TABLET BY MOUTH EVERY DAY Patient taking differently: Take 5 mg by mouth daily.  08/24/17  Yes Lendon Colonel, NP  Ascorbic Acid (VITAMIN C) 500 MG CAPS Take 500 mg by mouth daily.   Yes [provider]  aspirin EC 81 MG tablet Take 1 tablet by mouth daily.   Yes [provider]  benzonatate (TESSALON) 100 MG capsule  Take 1 capsule by mouth every 8 (eight) hours. 12/11/17  Yes [provider]  buPROPion (WELLBUTRIN SR) 150 MG 12 hr tablet Take 1 tablet (150 mg total) by mouth 2 (two)  times daily. Take 1 tablet daily for 1 week, stop smoking, then take twice daily after first week. 12/09/17 01/08/18 Yes Fenton Foy, NP  celecoxib (CELEBREX) 200 MG capsule Take 1 capsule (200 mg total) by mouth daily. 12/22/16  Yes [name], MD  cyclobenzaprine (FLEXERIL) 5 MG tablet Take 5 mg by mouth 2 (two) times daily.    Yes [provider]  Ferrous Sulfate (IRON) 325 (65 Fe) MG TABS Take 1 tablet by mouth daily. 12/10/15  Yes [provider]  fluticasone (FLONASE) 50 MCG/ACT nasal spray Place 2 sprays into both nostrils daily. 12/11/17  Yes [provider]  gabapentin (NEURONTIN) 300 MG capsule Take 1 capsule by mouth 3 (three) times daily. 08/15/16  Yes [provider]  lisinopril (PRINIVIL,ZESTRIL) 20 MG tablet Take 1 tablet (20 mg total) by mouth daily. 11/27/17  Yes Hongalgi, Lenis Dickinson, MD  metoCLOPramide (REGLAN) 5 MG tablet Take 1 tablet by mouth 4 (four) times daily -  before meals and at bedtime. 01/25/16  Yes [provider]  metoprolol succinate (TOPROL-XL) 25 MG 24 hr tablet Take 25 mg by mouth daily.   Yes [provider]  mirabegron ER (MYRBETRIQ) 25 MG TB24 tablet Take 25 mg by mouth daily.   Yes [provider]  montelukast (SINGULAIR) 10 MG tablet Take 10 mg by mouth daily. 12/11/17  Yes [provider]  nitroGLYCERIN (NITROSTAT) 0.4 MG SL tablet Place 0.4 mg under the tongue every 5 (five) minutes as needed for chest pain.   Yes [provider]  omeprazole (PRILOSEC) 40 MG capsule Take 40 mg by mouth daily.    Yes [provider]  Oxycodone HCl 10 MG TABS Take 10 mg by mouth 4 (four) times daily.    Yes [provider]  simvastatin (ZOCOR) 10 MG tablet Take 10 mg by mouth daily. 12/23/15  Yes [provider]  triamcinolone cream (KENALOG) 0.5 % APPLY TOPICAL TO AFFECTED AREA 2 TIMES A DAY AS NEEDED FOR RASH 12/01/17  Yes [provider]  VENTOLIN HFA 108  (90 Base) MCG/ACT inhaler Inhale 2 puffs into the lungs every 4 (four) hours as needed for wheezing or shortness of breath.  12/11/15  Yes [provider]  potassium gluconate (HM POTASSIUM) 595 (99 K) MG TABS tablet Take 595 mg by mouth daily.    [provider]   Allergies  Allergen Reactions  . Aspirin     Regular asa    Review of Systems  Constitutional: Positive for activity change and fatigue.  Respiratory: Positive for shortness of breath.   Neurological: Positive for weakness.   Physical Exam  Constitutional: He is oriented to person, place, and time. He is cooperative.  HENT:  Head: Normocephalic and atraumatic.  Pulmonary/Chest: No accessory muscle usage. No tachypnea. No respiratory distress.  Abdominal: There is no tenderness.  Neurological: He is alert and oriented to person, place, and time.  Skin: Skin is warm and dry. There is pallor.  Psychiatric: He has a normal mood and affect. His speech is normal and behavior is normal. Cognition and memory are normal.  Nursing note and vitals reviewed.  Vital Signs: BP 112/65   Pulse 74   Temp 98.4 F (36.9 C) (Oral)   Resp 18  Ht 6' (1.829 m)   Wt 68 kg   SpO2 92%   BMI 20.33 kg/m  Pain Scale: 0-10   Pain Score: 0-No pain   SpO2: SpO2: 92 % O2 Device:SpO2: 92 % O2 Flow Rate: .O2 Flow Rate (L/min): 2 L/min  IO: Intake/output summary:   Intake/Output Summary (Last 24 hours) at 12/30/2017 1220 Last data filed at 12/30/2017 0300 Gross per 24 hour  Intake 1009.38 ml  Output -  Net 1009.38 ml    LBM: Last BM Date: 12/29/17 Baseline Weight: Weight: 68 kg Most recent weight: Weight: 68 kg     Palliative Assessment/Data: PPS 60%   Flowsheet Rows     Most Recent Value  Intake Tab  Referral Department  Hospitalist  Unit at Time of Referral  Med/Surg Unit  Palliative Care Primary Diagnosis  Cancer  Palliative Care Type  New Palliative care  Date first seen by Palliative Care  12/30/17    Clinical Assessment  Palliative Performance Scale Score  60%  Psychosocial & Spiritual Assessment  Palliative Care Outcomes  Patient/Family meeting held?  Yes  Who was at the meeting?  patient, daughter, daughter bf  Palliative Care Outcomes  Clarified goals of care, Provided end of life care assistance, Provided psychosocial or spiritual support, ACP counseling assistance, Counseled regarding hospice, Provided advance care planning, Completed durable DNR      Time In: 1115 Time Out: 1225 Time Total: 70 Greater than 50%  of this time was spent counseling and coordinating care related to the above assessment and plan.  Signed by:  Ihor Dow, FNP-C Palliative Medicine Team  Phone: [phone] Fax: [phone]   Please contact Palliative Medicine Team phone at [phone] for questions and concerns.  For individual provider: See Shea Evans

## 2017-12-30 NOTE — Care Management Note (Signed)
Case Management Note  Patient Details  Name: Bradley [name] MRN: [id number] Date of Birth: 12/29/1940  Subjective/Objective:   Pneumonia. New lung cancer diagnosis. From home with daughter and BIL. Has PCP- Dr. Maudie Mercury, drives himself to appts in [place]. Friend drives him to appts in [place]. Has cane for long walking distances, has been having to use scooters in grocery stores lately. Acutley on oxygen. Recommended for SNF yesterday, patient wants to be evaluated again by PT to see if recommendation stands. Daughter reports that she can not take care of patient at home if he needs SNF, she has medical problems as well as BIL.            Action/Plan: Re-eval by PT.  Palliative consult pending.   Expected Discharge Date:    12/30/2017              Expected Discharge Plan:  [place]  In-House Referral:  Clinical Social Work, Hospice / Palliative Care, Chaplain  Discharge planning Services  CM Consult  Post Acute Care Choice:    Choice offered to:     DME Arranged:    DME Agency:     HH Arranged:    [place] Agency:     Status of Service:  In process, will continue to follow  If discussed at Long Length of Stay Meetings, dates discussed:    Additional Comments:  Bradley [name], Bradley Reading, RN 12/30/2017, 11:22 AM

## 2017-12-31 LAB — CBC WITH DIFFERENTIAL/PLATELET
Abs Immature Granulocytes: 0.13 10*3/uL — ABNORMAL HIGH (ref 0.00–0.07)
Basophils Absolute: 0.1 10*3/uL (ref 0.0–0.1)
Basophils Relative: 0 %
Eosinophils Absolute: 0.2 10*3/uL (ref 0.0–0.5)
Eosinophils Relative: 1 %
HEMATOCRIT: 38.3 % — AB (ref 39.0–52.0)
Hemoglobin: 12.4 g/dL — ABNORMAL LOW (ref 13.0–17.0)
Immature Granulocytes: 1 %
LYMPHS ABS: 2.2 10*3/uL (ref 0.7–4.0)
Lymphocytes Relative: 11 %
MCH: 29.9 pg (ref 26.0–34.0)
MCHC: 32.4 g/dL (ref 30.0–36.0)
MCV: 92.3 fL (ref 80.0–100.0)
Monocytes Absolute: 1.5 10*3/uL — ABNORMAL HIGH (ref 0.1–1.0)
Monocytes Relative: 7 %
NEUTROS ABS: 16.9 10*3/uL — AB (ref 1.7–7.7)
Neutrophils Relative %: 80 %
Platelets: 386 10*3/uL (ref 150–400)
RBC: 4.15 MIL/uL — ABNORMAL LOW (ref 4.22–5.81)
RDW: 16.7 % — ABNORMAL HIGH (ref 11.5–15.5)
WBC: 20.9 10*3/uL — ABNORMAL HIGH (ref 4.0–10.5)
nRBC: 0 % (ref 0.0–0.2)

## 2017-12-31 LAB — MRSA PCR SCREENING: MRSA by PCR: POSITIVE — AB

## 2017-12-31 MED ORDER — CHLORHEXIDINE GLUCONATE CLOTH 2 % EX PADS: 6.0000 | MEDICATED_PAD | Freq: Every day | CUTANEOUS | Status: DC

## 2017-12-31 MED ORDER — AMOXICILLIN-POT CLAVULANATE 875-125 MG PO TABS
1.0000 | ORAL_TABLET | Freq: Two times a day (BID) | ORAL | Status: DC
  Administered 2018-01-01: 1 via ORAL
  Filled 2017-12-31: qty 1

## 2017-12-31 MED ORDER — MUPIROCIN 2 % EX OINT
1.0000 "application " | TOPICAL_OINTMENT | Freq: Two times a day (BID) | CUTANEOUS | Status: DC
  Administered 2018-01-01 (×2): 1 via NASAL
  Filled 2017-12-31 (×2): qty 22

## 2017-12-31 NOTE — Progress Notes (Deleted)
Visited with patient she expressed concern for spiritual support and prayer. Patient also expressed concern for her husband at home because he is not well. She is maintaining to return home soon. Listened supportively to patient and helped her begin life review and encourage her and offered prayer based on her request.

## 2017-12-31 NOTE — Progress Notes (Signed)
Visited with patient, his daughter and his son-in-law. Patient expressed concern for spiritual support and pray. Patient daughter said she knows that lot of people are praying for her father.

## 2017-12-31 NOTE — NC FL2 (Signed)
[place] LEVEL OF CARE SCREENING TOOL     IDENTIFICATION  Patient Name: Bradley [name] Birthdate: 10-Dec-1940 Sex: male Admission Date (Current Location): 12/29/2017  [place] and [place] Number:  [place] [phone] Facility and Address:  [facility] [address], [place]      Provider Number: [id number] [id number]  Attending Physician Name and Address:  Murlean Iba, MD  Relative Name and Phone Number:       Current Level of Care: Hospital Recommended Level of Care: [place] Prior Approval Number:    Date Approved/Denied:   PASRR Number: [id number]  Discharge Plan: SNF    Current Diagnoses: Patient Active Problem List   Diagnosis Date Noted  . Acute maxillary sinusitis 12/30/2017  . Palliative care by specialist   . Goals of care, counseling/discussion   . Malignant neoplasm of left lung ([place])   . Left leg weakness   . Nosocomial pneumonia 12/29/2017  . Non-small cell lung cancer ([place]) 12/29/2017  . Mass of lower lobe of left lung 12/10/2017  . Chest pain in adult   . Syncope and collapse   . Rt Hip fracture ([place]) 12/19/2016  . Chest pain 09/12/2016  . Leukocytosis 09/12/2016  . Abnormal chest x-ray 09/12/2016  . Hyperlipidemia 09/12/2016  . Hypokalemia 09/12/2016  . Precordial pain   . Tobacco abuse 09/11/2016  . Emphysema, unspecified ([place]) 09/11/2016  . CAD (coronary artery disease), native coronary artery 09/11/2016  . Chronic pain syndrome 09/11/2016  . Essential hypertension 09/11/2016  . Right-sided epistaxis 03/15/2016    Orientation RESPIRATION BLADDER Height & Weight     Self, Time, Situation, Place  O2(2L) Continent Weight: 149 lb 14.6 oz (68 kg) Height:  6' (182.9 cm)  BEHAVIORAL SYMPTOMS/MOOD NEUROLOGICAL BOWEL NUTRITION STATUS      Continent (Regular)  AMBULATORY STATUS COMMUNICATION OF NEEDS Skin   Limited Assist Verbally Normal                       Personal Care  Assistance Level of Assistance  Bathing, Feeding, Dressing Bathing Assistance: Limited assistance Feeding assistance: Independent Dressing Assistance: Limited assistance     Functional Limitations Info  Sight, Hearing, Speech Sight Info: Adequate Hearing Info: Adequate Speech Info: Adequate    SPECIAL CARE FACTORS FREQUENCY  PT (By licensed PT)     PT Frequency: 5x/week              Contractures Contractures Info: Not present    Additional Factors Info  Code Status, Allergies Code Status Info: DNR Allergies Info: Aspirin           Current Medications (12/31/2017):  This is the current hospital active medication list Current Facility-Administered Medications  Medication Dose Route Frequency Provider Last Rate Last Dose  . 0.9 %  sodium chloride infusion   Intravenous PRN Johnson, Clanford L, MD      . albuterol (PROVENTIL) (2.5 MG/3ML) 0.083% nebulizer solution 2.5 mg  2.5 mg Nebulization Q4H PRN Johnson, Clanford L, MD      . amLODipine (NORVASC) tablet 5 mg  5 mg Oral Daily Johnson, Clanford L, MD   5 mg at 12/31/17 1019  . [START ON 01/01/2018] amoxicillin-clavulanate (AUGMENTIN) 875-125 MG per tablet 1 tablet  1 tablet Oral Q12H Johnson, Clanford L, MD      . aspirin EC tablet 81 mg  81 mg Oral Daily Johnson, Clanford L, MD   81 mg at 12/31/17 1018  . benzonatate (TESSALON)  capsule 100 mg  100 mg Oral Q8H Johnson, Clanford L, MD   100 mg at 12/31/17 1217  . buPROPion (WELLBUTRIN SR) 12 hr tablet 150 mg  150 mg Oral BID Johnson, Clanford L, MD   150 mg at 12/31/17 1018  . ceFEPIme (MAXIPIME) 1 g in sodium chloride 0.9 % 100 mL IVPB  1 g Intravenous Q8H Johnson, Clanford L, MD 200 mL/hr at 12/31/17 1444 1 g at 12/31/17 1444  . celecoxib (CELEBREX) capsule 200 mg  200 mg Oral Daily Johnson, Clanford L, MD   200 mg at 12/31/17 1023  . chlorhexidine (PERIDEX) 0.12 % solution 15 mL  15 mL Mouth Rinse BID Johnson, Clanford L, MD   15 mL at 12/31/17 1020  . enoxaparin  (LOVENOX) injection 40 mg  40 mg Subcutaneous Q24H Johnson, Clanford L, MD   40 mg at 12/31/17 1217  . ferrous sulfate tablet 325 mg  325 mg Oral Daily Johnson, Clanford L, MD   325 mg at 12/31/17 1019  . fluticasone (FLONASE) 50 MCG/ACT nasal spray 2 spray  2 spray Each Nare Daily Johnson, Clanford L, MD   2 spray at 12/31/17 1027  . gabapentin (NEURONTIN) capsule 300 mg  300 mg Oral TID Wynetta Emery, Clanford L, MD   300 mg at 12/31/17 1027  . guaiFENesin (MUCINEX) 12 hr tablet 1,200 mg  1,200 mg Oral BID Johnson, Clanford L, MD   1,200 mg at 12/31/17 1020  . ipratropium-albuterol (DUONEB) 0.5-2.5 (3) MG/3ML nebulizer solution 3 mL  3 mL Nebulization TID Johnson, Clanford L, MD   3 mL at 12/31/17 1440  . MEDLINE mouth rinse  15 mL Mouth Rinse q12n4p Johnson, Clanford L, MD   15 mL at 12/31/17 1217  . metoCLOPramide (REGLAN) tablet 5 mg  5 mg Oral TID AC & HS Johnson, Clanford L, MD   5 mg at 12/31/17 1217  . metoprolol succinate (TOPROL-XL) 24 hr tablet 25 mg  25 mg Oral Daily Johnson, Clanford L, MD   25 mg at 12/31/17 1019  . mirabegron ER (MYRBETRIQ) tablet 25 mg  25 mg Oral Daily Johnson, Clanford L, MD   25 mg at 12/31/17 1018  . mometasone-formoterol (DULERA) 200-5 MCG/ACT inhaler 2 puff  2 puff Inhalation BID Wynetta Emery, Clanford L, MD   2 puff at 12/31/17 0746  . montelukast (SINGULAIR) tablet 10 mg  10 mg Oral Daily Johnson, Clanford L, MD   10 mg at 12/30/17 2157  . nitroGLYCERIN (NITROSTAT) SL tablet 0.4 mg  0.4 mg Sublingual Q5 min PRN Johnson, Clanford L, MD      . oxyCODONE (Oxy IR/ROXICODONE) immediate release tablet 5-10 mg  5-10 mg Oral Q6H PRN Johnson, Clanford L, MD   10 mg at 12/31/17 1027  . pantoprazole (PROTONIX) EC tablet 40 mg  40 mg Oral Daily Johnson, Clanford L, MD   40 mg at 12/31/17 1018  . simvastatin (ZOCOR) tablet 10 mg  10 mg Oral q1800 Johnson, Clanford L, MD   10 mg at 12/30/17 1730  . vancomycin (VANCOCIN) IVPB 750 mg/150 ml premix  750 mg Intravenous Q12H Johnson,  Clanford L, MD 150 mL/hr at 12/31/17 1018 750 mg at 12/31/17 1018  . vitamin C (ASCORBIC ACID) tablet 500 mg  500 mg Oral Daily Johnson, Clanford L, MD   500 mg at 12/31/17 1018     Discharge Medications: Please see discharge summary for a list of discharge medications.  Relevant Imaging Results:  Relevant Lab Results:   Additional  Information SSN [id number], Clydene Pugh, LCSW

## 2017-12-31 NOTE — Clinical Social Work Placement (Signed)
   CLINICAL SOCIAL WORK PLACEMENT  NOTE  Date:  12/31/2017  Patient Details  Name: Sander Remedios MRN: [id number] Date of Birth: 03-30-1940  Clinical Social Work is seeking post-discharge placement for this patient at the [place] level of care (*CSW will initial, date and re-position this form in  chart as items are completed):  Yes   Patient/family provided with [facility] Work Department's list of facilities offering this level of care within the geographic area requested by the patient (or if unable, by the patient's family).  Yes   Patient/family informed of their freedom to choose among providers that offer the needed level of care, that participate in Medicare, Medicaid or managed care program needed by the patient, have an available bed and are willing to accept the patient.  Yes   Patient/family informed of [facility]'s ownership interest in [facility] and [facility], as well as of the fact that they are under no obligation to receive care at these facilities.  PASRR submitted to EDS on 12/31/17     PASRR number received on 12/31/17     Existing PASRR number confirmed on       FL2 transmitted to all facilities in geographic area requested by pt/family on 12/31/17     FL2 transmitted to all facilities within larger geographic area on       Patient informed that his/her managed care company has contracts with or will negotiate with certain facilities, including the following:            Patient/family informed of bed offers received.  Patient chooses bed at       Physician recommends and patient chooses bed at      Patient to be transferred to   on  .  Patient to be transferred to facility by       Patient family notified on   of transfer.  Name of family member notified:        PHYSICIAN       Additional Comment:    _______________________________________________ Ihor Gully, LCSW 12/31/2017, 4:11 PM

## 2017-12-31 NOTE — Progress Notes (Signed)
PROGRESS NOTE    Bradley [name]  QQP:[id number]  DOB: January 30, 1940  DOA: 12/29/2017 PCP: Jani Gravel, MD  Brief Admission Hx: 77 y.o. male with newly diagnosed non-small cell lung cancer who recently had a lung biopsy done approximately 2 weeks ago presents to ED by EMS complaining of generalized weakness.   Patient was admitted with a nosocomial pneumonia.  MDM/Assessment & Plan:   1. Nosocomial pneumonia - improving, continue IV antibiotics and respiratory support.  Continue broad spectrum antibiotics for now pending sputum culture and blood culture findings.  He is clinically improving and planning to de-escalate antibiotics 12/6.   2. Newly diagnosed non small cell lung cancer - Pt was seen by the oncologist to discuss treatment.  He plans to follow up with oncology outpatient to further discuss stage and treatment.    3. Acute right maxillary sinusitis - He is on IV antibiotics at this time.  4. Syncope and collapse - suspect orthostatic hypotension as cause, PT recommending SNF placement. Social workers arranging.  5. Leukocytosis-WBC trending down.  Markedly elevated WBC likely related to pneumonia and recent biopsy and cancer. 6. Tobacco abuse- I did speak with the patient about his ongoing smoking however at this time he declines to stop smoking and says that he already has lung cancer and he is going to enjoy the rest of his life. 7. Essential hypertension-his blood pressures are soft now and we are holding any blood pressure medications.  Will readdress if needed. 8. I discussed CODE STATUS with the patient and he was very clear that he would be DNR/DNI.   DVT Prophylaxis: Lovenox Code Status: DNR Family Communication: Daughter at bedside Disposition Plan: anticipating SNF placement in 1-2 days Antibiotics:   Cefepime 12/3 >12/5  Vancomycin 12/3 >12/5  augmentin 12/6 >  Subjective: Patient reports weakness, but productive cough, no fever or  chills.  Objective: Vitals:   12/30/17 1939 12/30/17 2155 12/31/17 0600 12/31/17 0745  BP:  138/68 (!) 157/78   Pulse:  76 77   Resp:  20 20   Temp:  98.1 F (36.7 C) 98 F (36.7 C)   TempSrc:  Oral Oral   SpO2: 93% 96% 96% 95%  Weight:      Height:        Intake/Output Summary (Last 24 hours) at 12/31/2017 1018 Last data filed at 12/31/2017 0200 Gross per 24 hour  Intake 240 ml  Output 700 ml  Net -460 ml   Filed Weights   12/29/17 0744  Weight: 68 kg   REVIEW OF SYSTEMS  As per history otherwise all reviewed and reported negative  Exam:  General exam: thin male, chronically ill appearing, NAD.  Respiratory system: improved BS LLL. No increased work of breathing. Cardiovascular system: S1 & S2 heard, RRR. No JVD, murmurs, gallops, clicks or pedal edema. Gastrointestinal system: Abdomen is nondistended, soft and nontender. Normal bowel sounds heard. Central nervous system: Alert and oriented. No focal neurological deficits. Extremities: no CCE.  Data Reviewed: Basic Metabolic Panel: Recent Labs  Lab 12/29/17 0752 12/30/17 0505  NA 135 137  K 3.9 3.8  CL 105 109  CO2 22 22  GLUCOSE 116* 80  BUN 20 17  CREATININE 1.07 0.82  CALCIUM 8.6* 8.1*   Liver Function Tests: Recent Labs  Lab 12/29/17 0752  AST 15  ALT 12  ALKPHOS 109  BILITOT 1.2  PROT 7.4  ALBUMIN 3.4*   No results for input(s): LIPASE, AMYLASE in the last 168 hours. No results  for input(s): AMMONIA in the last 168 hours. CBC: Recent Labs  Lab 12/29/17 0752 12/30/17 0505 12/31/17 0458  WBC 37.6* 24.9* 20.9*  NEUTROABS 33.1* 20.8* 16.9*  HGB 14.7 12.8* 12.4*  HCT 45.8 40.2 38.3*  MCV 92.0 93.9 92.3  PLT 435* 361 386   Cardiac Enzymes: Recent Labs  Lab 12/29/17 0800  CKTOTAL 42*  TROPONINI 0.09*   CBG (last 3)  No results for input(s): GLUCAP in the last 72 hours. Recent Results (from the past 240 hour(s))  Culture, blood (Routine x 2)     Status: None (Preliminary result)    Collection Time: 12/29/17  8:00 AM  Result Value Ref Range Status   Specimen Description BLOOD RIGHT ARM  Final   Special Requests   Final    BOTTLES DRAWN AEROBIC AND ANAEROBIC Blood Culture adequate volume   Culture   Final    NO GROWTH 2 DAYS Performed at [facility], [address], [place], [place] [address]    Report Status PENDING  Incomplete  Culture, blood (Routine x 2)     Status: None (Preliminary result)   Collection Time: 12/29/17  8:05 AM  Result Value Ref Range Status   Specimen Description BLOOD RIGHT HAND  Final   Special Requests   Final    BOTTLES DRAWN AEROBIC AND ANAEROBIC Blood Culture adequate volume   Culture   Final    NO GROWTH 2 DAYS Performed at [facility], [address] [place], [place] [address]    Report Status PENDING  Incomplete     Studies: Mr Jeri Cos Wo Contrast  Result Date: 12/29/2017 CLINICAL DATA:  Initial evaluation for newly diagnosed lung mass, generalized weakness. EXAM: MRI HEAD WITHOUT AND WITH CONTRAST TECHNIQUE: Multiplanar, multiecho pulse sequences of the brain and surrounding structures were obtained without and with intravenous contrast. CONTRAST:  60 cc of Gadavist. COMPARISON:  Prior CT from earlier the same day. FINDINGS: Brain: Diffuse prominence of the CSF containing spaces compatible with generalized age-related cerebral atrophy. Patchy and confluent T2/FLAIR hyperintensity within the periventricular and deep white matter both cerebral hemispheres most consistent with chronic small vessel ischemic disease, moderate nature. Encephalomalacia with gliosis within the parasagittal anterior right frontal lobe compatible with chronic right PCA territory infarct. Small remote lacunar infarct present within the left thalamus. No abnormal foci of restricted diffusion to suggest acute or subacute ischemia. Gray-white matter differentiation maintained. No foci of susceptibility artifact to suggest acute or chronic intracranial hemorrhage.  No mass lesion, midline shift or mass effect. No hydrocephalus. No extra-axial fluid collection. Pituitary gland suprasellar region within normal limits. No abnormal enhancement or findings to suggest intracranial metastatic disease. Vascular: Major intracranial vascular flow voids are maintained. Skull and upper cervical spine: Craniocervical junction normal. Upper cervical spine within normal limits. No focal marrow replacing lesion. Note made of a 11 mm nonenhancing T2 hyperintense lesion at the left postauricular soft tissues (series 7, image 5), indeterminate, but favored to reflect a small sebaceous cyst. A cystic lymph node could also be considered. Scalp soft tissues demonstrate no other acute abnormality. Sinuses/Orbits: Globes and orbital soft tissues demonstrate no acute finding. Patient status post bilateral ocular lens replacement. Acute right maxillary sinusitis with moderate layering fluid within the right maxillary sinus. Paranasal sinuses are otherwise largely clear. No significant mastoid effusion. Inner ear structures grossly normal. Other: None. IMPRESSION: 1. No acute intracranial abnormality. No evidence for intracranial metastatic disease. 2. 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 3. Underlying atrophy with moderate chronic microvascular ischemic disease. 4. 11 mm cystic lesion within the left postauricular soft tissues, indeterminate, but favored to reflect a small sebaceous cyst. A possible cystic and/or necrotic lymph node not entirely excluded. Correlation with physical exam recommended. 5. Acute right maxillary sinusitis. Electronically Signed   By: Jeannine Boga M.D.   On: 12/29/2017 20:14   Scheduled Meds: . amLODipine  5 mg Oral Daily  . aspirin EC  81 mg Oral Daily  . benzonatate  100 mg Oral Q8H  . buPROPion  150 mg Oral BID  . celecoxib  200 mg Oral Daily  . chlorhexidine  15 mL Mouth Rinse BID  . enoxaparin  (LOVENOX) injection  40 mg Subcutaneous Q24H  . ferrous sulfate  325 mg Oral Daily  . fluticasone  2 spray Each Nare Daily  . gabapentin  300 mg Oral TID  . guaiFENesin  1,200 mg Oral BID  . ipratropium-albuterol  3 mL Nebulization TID  . mouth rinse  15 mL Mouth Rinse q12n4p  . metoCLOPramide  5 mg Oral TID AC & HS  . metoprolol succinate  25 mg Oral Daily  . mirabegron ER  25 mg Oral Daily  . mometasone-formoterol  2 puff Inhalation BID  . montelukast  10 mg Oral Daily  . pantoprazole  40 mg Oral Daily  . simvastatin  10 mg Oral q1800  . vitamin C  500 mg Oral Daily   Continuous Infusions: . sodium chloride    . sodium chloride 1,000 mL (12/30/17 0113)  . ceFEPime (MAXIPIME) IV 1 g (12/31/17 4270)  . vancomycin 750 mg (12/31/17 1018)    Principal Problem:   Nosocomial pneumonia Active Problems:   Tobacco abuse   Emphysema, unspecified ([place])   CAD (coronary artery disease), native coronary artery   Essential hypertension   Leukocytosis   Syncope and collapse   Mass of lower lobe of left lung   Non-small cell lung cancer ([place])   Acute maxillary sinusitis   Palliative care by specialist   Goals of care, counseling/discussion   Malignant neoplasm of left lung ([place])   Left leg weakness   Time spent:   Irwin Brakeman, MD, FAAFP [facility] Pager [phone]  If 7PM-7AM, please contact night-coverage [email or web address] Password [email or web address] 12/31/2017, 10:18 AM    LOS: 2 days

## 2017-12-31 NOTE — Clinical Social Work Note (Signed)
Clinical Social Work Assessment  Patient Details  Name: Bradley [name] MRN: [id number] Date of Birth: 05-Jun-1940  Date of referral:  12/31/17               Reason for consult:  Facility Placement                Permission sought to share information with:    Permission granted to share information::     Name::        Agency::     Relationship::     Contact Information:     Housing/Transportation Living arrangements for the past 2 months:  Single Family Home Source of Information:  Patient Patient Interpreter Needed:  None Criminal Activity/Legal Involvement Pertinent to Current Situation/Hospitalization:  No - Comment as needed Significant Relationships:  Adult Children Lives with:  Adult Children Do you feel safe going back to the place where you live?  Yes Need for family participation in patient care:  Yes (Comment)  Care giving concerns:  None identified at baseline. Patient is independent at baseline, drives and ambulates with a cane.    Social Worker assessment / plan:  Patient is agreeable to go to short term rehab at a SNF. After review of facilities and star ratings, patient provided preferences.  LCSW sent referrals to patient's preferred facilities.    Employment status:  Retired Nurse, adult PT Recommendations:  [place] / Referral to community resources:  [facility](LCSW reviewed ratings extensively with patient as patient reported that he does not read. )  Patient/Family's Response to care: Patient is agreeable to go to short term rehab at [facility].   Patient/Family's Understanding of and Emotional Response to Diagnosis, Current Treatment, and Prognosis:  Patient understands his diagnosis, treatment and prognosis and is agreeable to short term rehab at [facility].   Emotional Assessment Appearance:  Appears stated age Attitude/Demeanor/Rapport:    Affect (typically observed):  Accepting,  Calm Orientation:  Oriented to Self, Oriented to Place, Oriented to  Time, Oriented to Situation Alcohol / Substance use:  Not Applicable Psych involvement (Current and /or in the community):  No (Comment)  Discharge Needs  Concerns to be addressed:  Discharge Planning Concerns Readmission within the last 30 days:  No Current discharge risk:  None Barriers to Discharge:  No Barriers Identified   Ihor Gully, LCSW 12/31/2017, 4:16 PM

## 2018-01-01 ENCOUNTER — Encounter: Payer: Self-pay | Admitting: *Deleted

## 2018-01-01 DIAGNOSIS — I2510 Atherosclerotic heart disease of native coronary artery without angina pectoris: Secondary | ICD-10-CM | POA: Diagnosis not present

## 2018-01-01 DIAGNOSIS — J439 Emphysema, unspecified: Secondary | ICD-10-CM | POA: Diagnosis not present

## 2018-01-01 DIAGNOSIS — R1310 Dysphagia, unspecified: Secondary | ICD-10-CM | POA: Diagnosis not present

## 2018-01-01 DIAGNOSIS — C3490 Malignant neoplasm of unspecified part of unspecified bronchus or lung: Secondary | ICD-10-CM | POA: Diagnosis not present

## 2018-01-01 DIAGNOSIS — R55 Syncope and collapse: Secondary | ICD-10-CM | POA: Diagnosis not present

## 2018-01-01 DIAGNOSIS — R262 Difficulty in walking, not elsewhere classified: Secondary | ICD-10-CM | POA: Diagnosis not present

## 2018-01-01 DIAGNOSIS — Z7401 Bed confinement status: Secondary | ICD-10-CM | POA: Diagnosis not present

## 2018-01-01 DIAGNOSIS — R531 Weakness: Secondary | ICD-10-CM | POA: Diagnosis not present

## 2018-01-01 DIAGNOSIS — R0902 Hypoxemia: Secondary | ICD-10-CM | POA: Diagnosis not present

## 2018-01-01 DIAGNOSIS — C3492 Malignant neoplasm of unspecified part of left bronchus or lung: Secondary | ICD-10-CM | POA: Diagnosis not present

## 2018-01-01 DIAGNOSIS — J0100 Acute maxillary sinusitis, unspecified: Secondary | ICD-10-CM | POA: Diagnosis not present

## 2018-01-01 DIAGNOSIS — M6281 Muscle weakness (generalized): Secondary | ICD-10-CM | POA: Diagnosis not present

## 2018-01-01 DIAGNOSIS — J438 Other emphysema: Secondary | ICD-10-CM | POA: Diagnosis not present

## 2018-01-01 DIAGNOSIS — I10 Essential (primary) hypertension: Secondary | ICD-10-CM | POA: Diagnosis not present

## 2018-01-01 DIAGNOSIS — J189 Pneumonia, unspecified organism: Secondary | ICD-10-CM | POA: Diagnosis not present

## 2018-01-01 DIAGNOSIS — D72829 Elevated white blood cell count, unspecified: Secondary | ICD-10-CM | POA: Diagnosis not present

## 2018-01-01 LAB — CBC WITH DIFFERENTIAL/PLATELET
Abs Immature Granulocytes: 0.2 10*3/uL — ABNORMAL HIGH (ref 0.00–0.07)
Basophils Absolute: 0.1 10*3/uL (ref 0.0–0.1)
Basophils Relative: 1 %
Eosinophils Absolute: 0.5 10*3/uL (ref 0.0–0.5)
Eosinophils Relative: 3 %
HCT: 39.1 % (ref 39.0–52.0)
Hemoglobin: 12.5 g/dL — ABNORMAL LOW (ref 13.0–17.0)
Immature Granulocytes: 1 %
LYMPHS ABS: 2 10*3/uL (ref 0.7–4.0)
Lymphocytes Relative: 12 %
MCH: 29.4 pg (ref 26.0–34.0)
MCHC: 32 g/dL (ref 30.0–36.0)
MCV: 92 fL (ref 80.0–100.0)
MONOS PCT: 8 %
Monocytes Absolute: 1.3 10*3/uL — ABNORMAL HIGH (ref 0.1–1.0)
Neutro Abs: 12.1 10*3/uL — ABNORMAL HIGH (ref 1.7–7.7)
Neutrophils Relative %: 75 %
Platelets: 397 10*3/uL (ref 150–400)
RBC: 4.25 MIL/uL (ref 4.22–5.81)
RDW: 16.6 % — ABNORMAL HIGH (ref 11.5–15.5)
WBC: 16.2 10*3/uL — ABNORMAL HIGH (ref 4.0–10.5)
nRBC: 0 % (ref 0.0–0.2)

## 2018-01-01 MED ORDER — IPRATROPIUM-ALBUTEROL 0.5-2.5 (3) MG/3ML IN SOLN: 3.0000 mL | Freq: Three times a day (TID) | RESPIRATORY_TRACT | Status: DC

## 2018-01-01 MED ORDER — LISINOPRIL 20 MG PO TABS: 10.0000 mg | ORAL_TABLET | Freq: Every day | ORAL | 0 refills | Status: DC

## 2018-01-01 MED ORDER — AMOXICILLIN-POT CLAVULANATE 875-125 MG PO TABS: 1.0000 | ORAL_TABLET | Freq: Two times a day (BID) | ORAL | 0 refills | Status: AC

## 2018-01-01 MED ORDER — OXYCODONE HCL 5 MG PO TABS: 5.0000 mg | ORAL_TABLET | ORAL | 0 refills | Status: DC | PRN

## 2018-01-01 MED ORDER — GUAIFENESIN ER 600 MG PO TB12: 1200.0000 mg | ORAL_TABLET | Freq: Two times a day (BID) | ORAL | 0 refills | Status: AC

## 2018-01-01 NOTE — Progress Notes (Signed)
Vital Signs obtained. [facility] here to transport patient.

## 2018-01-01 NOTE — Progress Notes (Signed)
Oncology Nurse Navigator Documentation  Oncology Nurse Navigator Flowsheets 01/01/2018  Navigator Location [facility]  Navigator Encounter Type Other/Patient case was dicussed at cancer conference yesterday.  He is referred to [facility].  I will update their nurse navigator on referral and an update on not enough tissue for molecular testing.    Barriers/Navigation Needs Coordination of Care  Interventions Coordination of Care  Coordination of Care Other  Acuity Level 2  Time Spent with Patient 30

## 2018-01-01 NOTE — Progress Notes (Signed)
Physical Therapy Treatment Patient Details Name: Bradley [name] MRN: [id number] DOB: 1940/07/08 Today's Date: 01/01/2018    History of Present Illness  Bradley [name] is a 77 y.o. male with newly diagnosed non-small cell lung cancer who recently had a lung biopsy done approximately 2 weeks ago presents to ED by EMS complaining of generalized weakness.  His symptoms have been ongoing for about 2 weeks.  The patient says that he became acutely more weak last night.  He fell at around 2 AM this morning.  The patient says that he continues to smoke cigarettes and has smoked since the age of 42.  The patient says that he is planning to stop smoking at this time.  The patient says that he has decided that he does not want to have chemotherapy or radiation treatment.  He says that he does want to see the oncologist to discuss his prognosis.  However, he is not interested in any further treatment after that.  The patient has had a productive cough of greenish sputum worsened over the past several days.  He has also had some fever and chills.  His daughter says that he has generally been weak over the past 2 weeks and that has gotten worse.  He has no nausea or vomiting.  He has occasional chest pains.    PT Comments    Patient agreeable to participating in PT today. Requested to ambulate without supplemental oxygen; was on 2L O2 on the wall when PT arrived. Patient exhibited increased endurance/distance for ambulation with mostly 3 point gait pattern using SPC on room air, no loss of balance, limited mostly due to fatigue and DOE; SPO2 post amb on room air 82; HR 92 bpm; returned to SPO2 88% HR 82 bpm within 2 minutes.   Patient would continue to benefit from skilled physical therapy in current environment and next venue to continue return to prior function and increase strength, endurance, balance, coordination, and functional mobility and gait skills.    Follow Up Recommendations  SNF;Supervision for  mobility/OOB;Supervision - Intermittent     Equipment Recommendations  None recommended by PT    Recommendations for Other Services       Precautions / Restrictions Precautions Precautions: Fall Restrictions Weight Bearing Restrictions: No    Mobility  Bed Mobility Overal bed mobility: Modified Independent                Transfers Overall transfer level: Needs assistance Equipment used: Straight cane Transfers: Sit to/from Stand;Stand Pivot Transfers Sit to Stand: Supervision Stand pivot transfers: Min guard       General transfer comment: slightly labored movement  Ambulation/Gait Ambulation/Gait assistance: Min guard Gait Distance (Feet): 100 Feet Assistive device: Straight cane Gait Pattern/deviations: Step-to pattern;Decreased step length - left;Decreased stance time - right;Decreased stride length Gait velocity: decreased   General Gait Details: increased endurance/distance for ambulation with mostly 3 point gait pattern using SPC, no loss of balance, limited mostly due to fatigue and DOE; SPO2 post amb on room air 82; HR 92; returned to SPO2 88% within 2 minutes   Stairs             Wheelchair Mobility    Modified Rankin (Stroke Patients Only)       Balance Overall balance assessment: Needs assistance Sitting-balance support: Feet supported;No upper extremity supported Sitting balance-Leahy Scale: Good     Standing balance support: Single extremity supported;During functional activity Standing balance-Leahy Scale: Fair Standing balance comment: using SPC  Cognition Arousal/Alertness: Awake/alert Behavior During Therapy: WFL for tasks assessed/performed Overall Cognitive Status: Within Functional Limits for tasks assessed                                        Exercises General Exercises - Lower Extremity Long Arc Quad: Seated;AROM;Strengthening;Both;10 reps Hip  Flexion/Marching: Seated;AROM;Strengthening;Both;10 reps Toe Raises: Seated;AROM;Strengthening;Both;10 reps Heel Raises: Seated;AROM;Strengthening;Both;10 reps    General Comments        Pertinent Vitals/Pain Pain Assessment: Faces Faces Pain Scale: Hurts a little bit Pain Location: LLE Pain Descriptors / Indicators: Discomfort;Sore    Home Living                      Prior Function            PT Goals (current goals can now be found in the care plan section) Acute Rehab PT Goals Patient Stated Goal: return home with family to assist PT Goal Formulation: With patient Time For Goal Achievement: 01/12/18 Potential to Achieve Goals: Good Progress towards PT goals: Progressing toward goals    Frequency    Min 3X/week      PT Plan Current plan remains appropriate    Co-evaluation              AM-PAC PT "6 Clicks" Mobility   Outcome Measure  Help needed turning from your back to your side while in a flat bed without using bedrails?: None Help needed moving from lying on your back to sitting on the side of a flat bed without using bedrails?: None Help needed moving to and from a bed to a chair (including a wheelchair)?: A Little Help needed standing up from a chair using your arms (e.g., wheelchair or bedside chair)?: A Little Help needed to walk in hospital room?: A Little Help needed climbing 3-5 steps with a railing? : A Lot 6 Click Score: 19    End of Session Equipment Utilized During Treatment: Gait belt Activity Tolerance: Patient limited by fatigue;Patient tolerated treatment well Patient left: with call bell/phone within reach;in chair Nurse Communication: Mobility status PT Visit Diagnosis: Unsteadiness on feet (R26.81);Other abnormalities of gait and mobility (R26.89);Muscle weakness (generalized) (M62.81)     Time: 9509-3267 PT Time Calculation (min) (ACUTE ONLY): 26 min  Charges:  $Gait Training: 8-22 mins $Therapeutic Exercise: 8-22  mins                     Floria Raveling. Hartnett-Rands, MS, PT Per [place] #[id number] 01/01/2018, 12:19 PM

## 2018-01-01 NOTE — Clinical Social Work Placement (Signed)
   CLINICAL SOCIAL WORK PLACEMENT  NOTE  Date:  01/01/2018  Patient Details  Name: Bradley [name] MRN: [id number] Date of Birth: [birth year]  Clinical Social Work is seeking post-discharge placement for this patient at the [place] level of care (*CSW will initial, date and re-position this form in  chart as items are completed):  Yes   Patient/family provided with [place] Work Department's list of facilities offering this level of care within the geographic area requested by the patient (or if unable, by the patient's family).  Yes   Patient/family informed of their freedom to choose among providers that offer the needed level of care, that participate in Medicare, Medicaid or managed care program needed by the patient, have an available bed and are willing to accept the patient.  Yes   Patient/family informed of [place]'s ownership interest in [facility] and [facility], as well as of the fact that they are under no obligation to receive care at these facilities.  PASRR submitted to EDS on 12/31/17     PASRR number received on 12/31/17     Existing PASRR number confirmed on       FL2 transmitted to all facilities in geographic area requested by pt/family on 12/31/17     FL2 transmitted to all facilities within larger geographic area on       Patient informed that his/her managed care company has contracts with or will negotiate with certain facilities, including the following:        Yes   Patient/family informed of bed offers received.  Patient chooses bed at [facility]     Physician recommends and patient chooses bed at      Patient to be transferred to [facility] on 01/01/18.  Patient to be transferred to facility by [facility]     Patient family notified on 01/01/18 of transfer.  Name of family member notified:  Jeanie Sewer, daughter     PHYSICIAN       Additional Comment:  Discharge clinicals sent to  facility. RN to call EMS transport. LCSW signing off.   _______________________________________________ Ihor Gully, LCSW 01/01/2018, 5:00 PM

## 2018-01-01 NOTE — Progress Notes (Signed)
IV removed and report called to [facility] at [facility] in [place],  EMS called.

## 2018-01-01 NOTE — Care Management Important Message (Signed)
Important Message  Patient Details  Name: Bradley [name] MRN: [id number] Date of Birth: 03/19/1940   Medicare Important Message Given:  Yes    Gaddiel Cullens, Chauncey [place], RN 01/01/2018, 12:13 PM

## 2018-01-01 NOTE — Discharge Summary (Signed)
Physician Discharge Summary  Bradley [name] ZJQ:[id number] DOB: [birth year] DOA: 12/29/2017  PCP: Jani Gravel, MD Oncologist: Dr. Delton Coombes Cardiology: Ezequiel Kayser  Admit date: 12/29/2017 Discharge date: 01/01/2018  Admitted From: Home  Disposition: SNF rehab  Recommendations for Outpatient Follow-up:  1. Follow up with oncologist in 1 weeks to discuss cancer treatment options 2. Please recheck CBC and BMP in 7-10 days.   3. Follow up with cardiology as scheduled.   Discharge Condition: STABLE   CODE STATUS:  DNR   Brief Hospitalization Summary: Please see all hospital notes, images, labs for full details of the hospitalization. HPI: Bradley [name] is a 77 y.o. male with newly diagnosed non-small cell lung cancer who recently had a lung biopsy done approximately 2 weeks ago presents to ED by EMS complaining of generalized weakness.  His symptoms have been ongoing for about 2 weeks.  The patient says that he became acutely more weak last night.  He fell at around 2 AM this morning.  The patient says that he continues to smoke cigarettes and has smoked since the age of 88.  The patient says that he is planning to stop smoking at this time.  The patient says that he has decided that he does not want to have chemotherapy or radiation treatment.  He says that he does want to see the oncologist to discuss his prognosis.  However, he is not interested in any further treatment after that.  The patient has had a productive cough of greenish sputum worsened over the past several days.  He has also had some fever and chills.  His daughter says that he has generally been weak over the past 2 weeks and that has gotten worse.  He has no nausea or vomiting.  He has occasional chest pains.  ED course: Patient was evaluated in the ED and noted to be febrile with a rectal temperature of 102 degrees.  The patient reported symptoms of being lightheaded.  His white blood cell count was markedly elevated at 37.   His lactate remained within normal limits.  His blood pressure was soft on admission but responded to a fluid bolus.  He was hypoxic on arrival with a pulse ox of 87% on room air.  His troponin was mildly elevated at 0.09.  The patient was started on treatment for nosocomial associated pneumonia given his recent lung biopsy procedure.  Brief Admission Hx: 77 y.o.malewith newly diagnosed non-small cell lung cancer who recently had a lung biopsy done approximately 2 weeks ago presents to ED by EMS complaining of generalized weakness.   Patient was admitted with a nosocomial pneumonia.  MDM/Assessment & Plan:   1. Nosocomial pneumonia - much improved. He was treated with IV antibiotics and respiratory support.   He is clinically improving and planning to de-escalate antibiotics 12/6 to augmentin for 2 more days.  2. Newly diagnosed non small cell lung cancer - Ptwas seen by the oncologist to discuss treatment.  He plans to follow up with oncology outpatient to further discuss stage and treatment.   3. Acute right maxillary sinusitis - He was treated with IV antibiotics and oral augmentin.  4. Syncope and collapse - suspect orthostatic hypotension as cause, PT recommending SNF placement. Social workers arranging placement.  5. Leukocytosis-WBC trending down.  Markedly elevated WBC likely related to pneumonia and recent biopsy and cancer. Recommend checking cBC in 1-2 weeks.  6. Tobacco abuse- I did speak with the patient about his ongoing smoking however at this time he  declines to stop smoking and says that he already has lung cancer and he is going to enjoy the rest of his life. 7. Essential hypertension-resume his home blood pressure medications. 8. I discussed CODE STATUS with the patient and he was very clear that he would be DNR/DNI.   DVT Prophylaxis:Lovenox Code Status:DNR Family Communication:Daughter at bedside Disposition Plan: SNF placement Antibiotics:   Cefepime 12/3  >12/5  Vancomycin 12/3 >12/5  augmentin 12/6 >  Discharge Diagnoses:  Principal Problem:   Nosocomial pneumonia Active Problems:   Tobacco abuse   Emphysema, unspecified ([place])   CAD (coronary artery disease), native coronary artery   Essential hypertension   Leukocytosis   Syncope and collapse   Mass of lower lobe of left lung   Non-small cell lung cancer ([place])   Acute maxillary sinusitis   Palliative care by specialist   Goals of care, counseling/discussion   Malignant neoplasm of left lung ([place])   Left leg weakness  Discharge Instructions: Discharge Instructions    Call MD for:  difficulty breathing, headache or visual disturbances   Complete by:  As directed    Call MD for:  extreme fatigue   Complete by:  As directed    Call MD for:  persistant dizziness or light-headedness   Complete by:  As directed    Call MD for:  persistant nausea and vomiting   Complete by:  As directed    Diet - low sodium heart healthy   Complete by:  As directed    Increase activity slowly   Complete by:  As directed      Allergies as of 01/01/2018      Reactions   Aspirin    Regular asa      Medication List    STOP taking these medications   benzonatate 100 MG capsule Commonly known as:  TESSALON   HM POTASSIUM 595 (99 K) MG Tabs tablet Generic drug:  potassium gluconate   triamcinolone cream 0.5 % Commonly known as:  KENALOG     TAKE these medications   ADVAIR HFA 115-21 MCG/ACT inhaler Generic drug:  fluticasone-salmeterol Inhale 2 puffs into the lungs every 12 (twelve) hours.   amLODipine 5 MG tablet Commonly known as:  NORVASC TAKE 1 TABLET BY MOUTH EVERY DAY   amoxicillin-clavulanate 875-125 MG tablet Commonly known as:  AUGMENTIN Take 1 tablet by mouth every 12 (twelve) hours for 2 days.   aspirin EC 81 MG tablet Take 1 tablet by mouth daily.   buPROPion 150 MG 12 hr tablet Commonly known as:  WELLBUTRIN SR Take 1 tablet (150 mg total) by mouth 2 (two)  times daily. Take 1 tablet daily for 1 week, stop smoking, then take twice daily after first week.   celecoxib 200 MG capsule Commonly known as:  CELEBREX Take 1 capsule (200 mg total) by mouth daily.   cyclobenzaprine 5 MG tablet Commonly known as:  FLEXERIL Take 5 mg by mouth 2 (two) times daily.   fluticasone 50 MCG/ACT nasal spray Commonly known as:  FLONASE Place 2 sprays into both nostrils daily.   gabapentin 300 MG capsule Commonly known as:  NEURONTIN Take 1 capsule by mouth 3 (three) times daily.   guaiFENesin 600 MG 12 hr tablet Commonly known as:  MUCINEX Take 2 tablets (1,200 mg total) by mouth 2 (two) times daily for 5 days.   ipratropium-albuterol 0.5-2.5 (3) MG/3ML Soln Commonly known as:  DUONEB Take 3 mLs by nebulization 3 (three) times daily.  Iron 325 (65 Fe) MG Tabs Take 1 tablet by mouth daily.   lisinopril 20 MG tablet Commonly known as:  PRINIVIL,ZESTRIL Take 0.5 tablets (10 mg total) by mouth daily. What changed:  how much to take   metoCLOPramide 5 MG tablet Commonly known as:  REGLAN Take 1 tablet by mouth 4 (four) times daily -  before meals and at bedtime.   metoprolol succinate 25 MG 24 hr tablet Commonly known as:  TOPROL-XL Take 25 mg by mouth daily.   montelukast 10 MG tablet Commonly known as:  SINGULAIR Take 10 mg by mouth daily.   MYRBETRIQ 25 MG Tb24 tablet Generic drug:  mirabegron ER Take 25 mg by mouth daily.   nitroGLYCERIN 0.4 MG SL tablet Commonly known as:  NITROSTAT Place 0.4 mg under the tongue every 5 (five) minutes as needed for chest pain.   omeprazole 40 MG capsule Commonly known as:  PRILOSEC Take 40 mg by mouth daily.   oxyCODONE 5 MG immediate release tablet Commonly known as:  Oxy IR/ROXICODONE Take 1 tablet (5 mg total) by mouth every 4 (four) hours as needed for severe pain. What changed:    medication strength  how much to take  when to take this  reasons to take this   simvastatin 10 MG  tablet Commonly known as:  ZOCOR Take 10 mg by mouth daily.   VENTOLIN HFA 108 (90 Base) MCG/ACT inhaler Generic drug:  albuterol Inhale 2 puffs into the lungs every 4 (four) hours as needed for wheezing or shortness of breath.   Vitamin C 500 MG Caps Take 500 mg by mouth daily.      Follow-up Information    Derek Jack, MD. Schedule an appointment as soon as possible for a visit in 1 week(s).   Specialty:  Hematology Why:  HOSPITAL FOLLOW UP TO DISCUSS CANCER TREATMENT Contact information: [place] [address] [phone]          Allergies  Allergen Reactions  . Aspirin     Regular asa    Allergies as of 01/01/2018      Reactions   Aspirin    Regular asa      Medication List    STOP taking these medications   benzonatate 100 MG capsule Commonly known as:  TESSALON   HM POTASSIUM 595 (99 K) MG Tabs tablet Generic drug:  potassium gluconate   triamcinolone cream 0.5 % Commonly known as:  KENALOG     TAKE these medications   ADVAIR HFA 115-21 MCG/ACT inhaler Generic drug:  fluticasone-salmeterol Inhale 2 puffs into the lungs every 12 (twelve) hours.   amLODipine 5 MG tablet Commonly known as:  NORVASC TAKE 1 TABLET BY MOUTH EVERY DAY   amoxicillin-clavulanate 875-125 MG tablet Commonly known as:  AUGMENTIN Take 1 tablet by mouth every 12 (twelve) hours for 2 days.   aspirin EC 81 MG tablet Take 1 tablet by mouth daily.   buPROPion 150 MG 12 hr tablet Commonly known as:  WELLBUTRIN SR Take 1 tablet (150 mg total) by mouth 2 (two) times daily. Take 1 tablet daily for 1 week, stop smoking, then take twice daily after first week.   celecoxib 200 MG capsule Commonly known as:  CELEBREX Take 1 capsule (200 mg total) by mouth daily.   cyclobenzaprine 5 MG tablet Commonly known as:  FLEXERIL Take 5 mg by mouth 2 (two) times daily.   fluticasone 50 MCG/ACT nasal spray Commonly known as:  FLONASE  Place 2 sprays into both  nostrils daily.   gabapentin 300 MG capsule Commonly known as:  NEURONTIN Take 1 capsule by mouth 3 (three) times daily.   guaiFENesin 600 MG 12 hr tablet Commonly known as:  MUCINEX Take 2 tablets (1,200 mg total) by mouth 2 (two) times daily for 5 days.   ipratropium-albuterol 0.5-2.5 (3) MG/3ML Soln Commonly known as:  DUONEB Take 3 mLs by nebulization 3 (three) times daily.   Iron 325 (65 Fe) MG Tabs Take 1 tablet by mouth daily.   lisinopril 20 MG tablet Commonly known as:  PRINIVIL,ZESTRIL Take 0.5 tablets (10 mg total) by mouth daily. What changed:  how much to take   metoCLOPramide 5 MG tablet Commonly known as:  REGLAN Take 1 tablet by mouth 4 (four) times daily -  before meals and at bedtime.   metoprolol succinate 25 MG 24 hr tablet Commonly known as:  TOPROL-XL Take 25 mg by mouth daily.   montelukast 10 MG tablet Commonly known as:  SINGULAIR Take 10 mg by mouth daily.   MYRBETRIQ 25 MG Tb24 tablet Generic drug:  mirabegron ER Take 25 mg by mouth daily.   nitroGLYCERIN 0.4 MG SL tablet Commonly known as:  NITROSTAT Place 0.4 mg under the tongue every 5 (five) minutes as needed for chest pain.   omeprazole 40 MG capsule Commonly known as:  PRILOSEC Take 40 mg by mouth daily.   oxyCODONE 5 MG immediate release tablet Commonly known as:  Oxy IR/ROXICODONE Take 1 tablet (5 mg total) by mouth every 4 (four) hours as needed for severe pain. What changed:    medication strength  how much to take  when to take this  reasons to take this   simvastatin 10 MG tablet Commonly known as:  ZOCOR Take 10 mg by mouth daily.   VENTOLIN HFA 108 (90 Base) MCG/ACT inhaler Generic drug:  albuterol Inhale 2 puffs into the lungs every 4 (four) hours as needed for wheezing or shortness of breath.   Vitamin C 500 MG Caps Take 500 mg by mouth daily.       Procedures/Studies: Dg Chest 1 View  Result Date: 12/29/2017 CLINICAL DATA:  Cough, fever, weakness,  history of lung carcinoma, smoking history EXAM: CHEST  1 VIEW COMPARISON:  CT chest of 12/08/2017 and chest x-ray of 11/25/2016 FINDINGS: The left lower lobe mass is not as well seen by chest x-ray. However there is increasing parenchymal opacity in the left mid and lower lung consistent with atelectasis and pneumonia. Chronic markings remain at the lung bases. No pleural effusion is seen. Mediastinal and hilar contours otherwise are unremarkable with some prominence again noted of the left infrahilar region. Heart size is stable. No bony abnormality is seen. IMPRESSION: 1. Increasing opacity in the left mid and lower lung most consistent with atelectasis and pneumonia possibly as a result of the central lesion noted by CT. 2. Fullness of the left infrahilar region as a result of the previously demonstrated mass by CT chest. Electronically Signed   By: Ivar Drape M.D.   On: 12/29/2017 08:41   Ct Head Wo Contrast  Result Date: 12/29/2017 CLINICAL DATA:  Weakness, head injury after fall. EXAM: CT HEAD WITHOUT CONTRAST CT CERVICAL SPINE WITHOUT CONTRAST TECHNIQUE: Multidetector CT imaging of the head and cervical spine was performed following the standard protocol without intravenous contrast. Multiplanar CT image reconstructions of the cervical spine were also generated. COMPARISON:  CT scan of November 25, 2017. FINDINGS: CT  HEAD FINDINGS Brain: Old right frontal infarction is noted. Mild chronic ischemic white matter disease is noted. No mass effect or midline shift is noted. Ventricular size is within normal limits. There is no evidence of mass lesion, hemorrhage or acute infarction. Vascular: No hyperdense vessel or unexpected calcification. Skull: Normal. Negative for fracture or focal lesion. Sinuses/Orbits: Right maxillary sinusitis is noted. Other: None. CT CERVICAL SPINE FINDINGS Alignment: Normal. Skull base and vertebrae: No acute fracture. No primary bone lesion or focal pathologic process. Soft  tissues and spinal canal: No prevertebral fluid or swelling. No visible canal hematoma. Disc levels: Mild degenerative disc disease is noted at C5-6 and C6-7 with minimal anterior osteophyte formation. Upper chest: No acute abnormality seen. Other: Minimal degenerative changes are seen involving the right-sided posterior facet joints. 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 Right maxillary sinusitis. No acute intracranial abnormality seen. Mild multilevel degenerative disc disease. No acute abnormality seen in the cervical spine. Electronically Signed   By: Marijo Conception, M.D.   On: 12/29/2017 08:57   Ct Cervical Spine Wo Contrast  Result Date: 12/29/2017 CLINICAL DATA:  Weakness, head injury after fall. EXAM: CT HEAD WITHOUT CONTRAST CT CERVICAL SPINE WITHOUT CONTRAST TECHNIQUE: Multidetector CT imaging of the head and cervical spine was performed following the standard protocol without intravenous contrast. Multiplanar CT image reconstructions of the cervical spine were also generated. COMPARISON:  CT scan of November 25, 2017. FINDINGS: CT HEAD FINDINGS Brain: Old right frontal infarction is noted. Mild chronic ischemic white matter disease is noted. No mass effect or midline shift is noted. Ventricular size is within normal limits. There is no evidence of mass lesion, hemorrhage or acute infarction. Vascular: No hyperdense vessel or unexpected calcification. Skull: Normal. Negative for fracture or focal lesion. Sinuses/Orbits: Right maxillary sinusitis is noted. Other: None. CT CERVICAL SPINE FINDINGS Alignment: Normal. Skull base and vertebrae: No acute fracture. No primary bone lesion or focal pathologic process. Soft tissues and spinal canal: No prevertebral fluid or swelling. No visible canal hematoma. Disc levels: Mild degenerative disc disease is noted at C5-6 and C6-7 with minimal anterior osteophyte formation. Upper chest: No acute abnormality seen.  Other: Minimal degenerative changes are seen involving the right-sided posterior facet joints. 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 Right maxillary sinusitis. No acute intracranial abnormality seen. Mild multilevel degenerative disc disease. No acute abnormality seen in the cervical spine. Electronically Signed   By: Marijo Conception, M.D.   On: 12/29/2017 08:57   Mr Jeri Cos VQ Contrast  Result Date: 12/29/2017 CLINICAL DATA:  Initial evaluation for newly diagnosed lung mass, generalized weakness. EXAM: MRI HEAD WITHOUT AND WITH CONTRAST TECHNIQUE: Multiplanar, multiecho pulse sequences of the brain and surrounding structures were obtained without and with intravenous contrast. CONTRAST:  60 cc of Gadavist. COMPARISON:  Prior CT from earlier the same day. FINDINGS: Brain: Diffuse prominence of the CSF containing spaces compatible with generalized age-related cerebral atrophy. Patchy and confluent T2/FLAIR hyperintensity within the periventricular and deep white matter both cerebral hemispheres most consistent with chronic small vessel ischemic disease, moderate nature. Encephalomalacia with gliosis within the parasagittal anterior right frontal lobe compatible with chronic right PCA territory infarct. Small remote lacunar infarct present within the left thalamus. No abnormal foci of restricted diffusion to suggest acute or subacute ischemia. Gray-white matter differentiation maintained. No foci of susceptibility artifact to suggest acute or chronic intracranial hemorrhage. No mass lesion, midline shift or mass effect.  No hydrocephalus. No extra-axial fluid collection. Pituitary gland suprasellar region within normal limits. No abnormal enhancement or findings to suggest intracranial metastatic disease. Vascular: Major intracranial vascular flow voids are maintained. Skull and upper cervical spine: Craniocervical junction normal. Upper cervical spine within normal  limits. No focal marrow replacing lesion. Note made of a 11 mm nonenhancing T2 hyperintense lesion at the left postauricular soft tissues (series 7, image 5), indeterminate, but favored to reflect a small sebaceous cyst. A cystic lymph node could also be considered. Scalp soft tissues demonstrate no other acute abnormality. Sinuses/Orbits: Globes and orbital soft tissues demonstrate no acute finding. Patient status post bilateral ocular lens replacement. Acute right maxillary sinusitis with moderate layering fluid within the right maxillary sinus. Paranasal sinuses are otherwise largely clear. No significant mastoid effusion. Inner ear structures grossly normal. Other: None. IMPRESSION: 1. No acute intracranial abnormality. No evidence for intracranial metastatic disease. 2. 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 3. Underlying atrophy with moderate chronic microvascular ischemic disease. 4. 11 mm cystic lesion within the left postauricular soft tissues, indeterminate, but favored to reflect a small sebaceous cyst. A possible cystic and/or necrotic lymph node not entirely excluded. Correlation with physical exam recommended. 5. Acute right maxillary sinusitis. Electronically Signed   By: Jeannine Boga M.D.   On: 12/29/2017 20:14   Nm Pet Image Initial (pi) Skull Base To Thigh  Result Date: 12/09/2017 CLINICAL DATA:  Initial treatment strategy for pulmonary nodule. EXAM: NUCLEAR MEDICINE PET SKULL BASE TO THIGH TECHNIQUE: 8.46 mCi F-18 FDG was injected intravenously. Full-ring PET imaging was performed from the skull base to thigh after the radiotracer. CT data was obtained and used for attenuation correction and anatomic localization. Fasting blood glucose: 97 mg/dl COMPARISON:  CT chest 11/25/2017 FINDINGS: Mediastinal blood pool activity: SUV max 1.62 NECK: No hypermetabolic lymph nodes in the neck. Incidental CT findings: none CHEST: The left  lower lobe perihilar lung mass is again noted. This measures approximately 3.6 cm within SUV max of 18.97. Bilateral several small nodules identified within the left upper lobe which are nonspecific and exhibit mild FDG uptake including a 6 mm peribronchovascular nodule within SUV max of 1.94, image 32/8. Also in the left upper lobe is a tiny nodule measuring 3 mm within SUV max of 1.16, image 25/8. 5 mm left upper lobe lung nodule is noted without significant uptake, image 21/8. Since 11/25/2017 there has been interval development of multi focal patchy nodular densities in the right upper lobe: -Within the periphery of the right upper lobe there is a subpleural nodular density measuring 1.6 cm within SUV max of 7.02, image 14/8. This is new when compared with 11/25/2017. -Adjacent nodule, also new measures 1.2 cm and has an SUV max of 4.15, image 14/8. -central right upper lobe nodule measures 2.4 cm and has an SUV max 7.55, image 26/8. -sub solid nodule within the central right upper lobe measures 1.8 cm and has an SUV max of 3.4, image 28/8. Low right paratracheal lymph node measures 1.2 cm and has an SUV max of 5.6. Right hilar lymph node has an SUV max of 4.69. Incidental CT findings: Advanced changes of emphysema. Aortic atherosclerosis noted. ABDOMEN/PELVIS: No abnormal hypermetabolic activity within the liver, pancreas, adrenal glands, or spleen. No hypermetabolic lymph nodes in the abdomen or pelvis. Incidental CT findings: Gallstone noted. Aortic atherosclerosis. Small right renal calculi identified SKELETON: No focal hypermetabolic activity to suggest skeletal metastasis. Incidental CT findings: Previous right  hip arthroplasty. IMPRESSION: 1. The central left lower lobe perihilar lung mass exhibits intense FDG uptake and is concerning for primary bronchogenic carcinoma. Mild to moderate uptake is associated with the right paratracheal and right hilar lymph nodes. Cannot rule out metastatic adenopathy to  these areas. 2. Since the exam from 11/25/2017 there is been interval development of multifocal patchy nodular densities within the right upper lobe. These exhibit moderate increased radiotracer uptake with SUV max up to 7.55. As these appear new from 11/25/2017 it is likely that these are postinflammatory/infectious in etiology. Unusual, aggressive metastatic disease is considered less favored, nevertheless follow-up imaging is advised to ensure complete resolution to rule out foci of metastasis versus synchronous primary neoplasms. 3. Aortic Atherosclerosis (ICD10-I70.0) and Emphysema (ICD10-J43.9). Electronically Signed   By: Kerby Moors M.D.   On: 12/09/2017 09:04      Subjective: Pt says he is feeling a lot better.  He feels ready to go to rehab.  He is planning to participate.   Discharge Exam: Vitals:   01/01/18 1420 01/01/18 1434  BP: (!) 152/80   Pulse: 79   Resp: 20   Temp: 97.9 F (36.6 C)   SpO2: 96% 93%   Vitals:   01/01/18 0622 01/01/18 1108 01/01/18 1420 01/01/18 1434  BP: 130/69  (!) 152/80   Pulse: 77  79   Resp: 20  20   Temp: 98 F (36.7 C)  97.9 F (36.6 C)   TempSrc: Oral  Oral   SpO2: 95% 91% 96% 93%  Weight:      Height:       General exam: thin male, chronically ill appearing, NAD.  Respiratory system: improved BS LLL. No increased work of breathing. Cardiovascular system: S1 & S2 heard, RRR. No JVD, murmurs, gallops, clicks or pedal edema. Gastrointestinal system: Abdomen is nondistended, soft and nontender. Normal bowel sounds heard. Central nervous system: Alert and oriented. No focal neurological deficits. Extremities: no CCE.   The results of significant diagnostics from this hospitalization (including imaging, microbiology, ancillary and laboratory) are listed below for reference.     Microbiology: Recent Results (from the past 240 hour(s))  Culture, blood (Routine x 2)     Status: None (Preliminary result)   Collection Time: 12/29/17  8:00  AM  Result Value Ref Range Status   Specimen Description BLOOD RIGHT ARM  Final   Special Requests   Final    BOTTLES DRAWN AEROBIC AND ANAEROBIC Blood Culture adequate volume   Culture   Final    NO GROWTH 3 DAYS Performed at [facility], [address] [place], [place] [address]    Report Status PENDING  Incomplete  Culture, blood (Routine x 2)     Status: None (Preliminary result)   Collection Time: 12/29/17  8:05 AM  Result Value Ref Range Status   Specimen Description BLOOD RIGHT HAND  Final   Special Requests   Final    BOTTLES DRAWN AEROBIC AND ANAEROBIC Blood Culture adequate volume   Culture   Final    NO GROWTH 3 DAYS Performed at [facility], [address], [place], [place] [address]    Report Status PENDING  Incomplete  MRSA PCR Screening     Status: Abnormal   Collection Time: 12/31/17  4:50 PM  Result Value Ref Range Status   MRSA by PCR POSITIVE (A) NEGATIVE Final    Comment:        The GeneXpert MRSA Assay (FDA approved for NASAL specimens only), is  one component of a comprehensive MRSA colonization surveillance program. It is not intended to diagnose MRSA infection nor to guide or monitor treatment for MRSA infections. NOTIFIED NANCY RN 3A OF RESULT 12/31/17 @ 1900 Performed at [facility], [address] [place], [place] [address]      Labs: BNP (last 3 results) No results for input(s): BNP in the last 8760 hours. Basic Metabolic Panel: Recent Labs  Lab 12/29/17 0752 12/30/17 0505  NA 135 137  K 3.9 3.8  CL 105 109  CO2 22 22  GLUCOSE 116* 80  BUN 20 17  CREATININE 1.07 0.82  CALCIUM 8.6* 8.1*   Liver Function Tests: Recent Labs  Lab 12/29/17 0752  AST 15  ALT 12  ALKPHOS 109  BILITOT 1.2  PROT 7.4  ALBUMIN 3.4*   No results for input(s): LIPASE, AMYLASE in the last 168 hours. No results for input(s): AMMONIA in the last 168 hours. CBC: Recent Labs  Lab 12/29/17 0752 12/30/17 0505 12/31/17 0458 01/01/18 0535  WBC 37.6*  24.9* 20.9* 16.2*  NEUTROABS 33.1* 20.8* 16.9* 12.1*  HGB 14.7 12.8* 12.4* 12.5*  HCT 45.8 40.2 38.3* 39.1  MCV 92.0 93.9 92.3 92.0  PLT 435* 361 386 397   Cardiac Enzymes: Recent Labs  Lab 12/29/17 0800  CKTOTAL 42*  TROPONINI 0.09*   BNP: Invalid input(s): POCBNP CBG: No results for input(s): GLUCAP in the last 168 hours. D-Dimer No results for input(s): DDIMER in the last 72 hours. Hgb A1c No results for input(s): HGBA1C in the last 72 hours. Lipid Profile No results for input(s): CHOL, HDL, LDLCALC, TRIG, CHOLHDL, LDLDIRECT in the last 72 hours. Thyroid function studies No results for input(s): TSH, T4TOTAL, T3FREE, THYROIDAB in the last 72 hours.  Invalid input(s): FREET3 Anemia work up No results for input(s): VITAMINB12, FOLATE, FERRITIN, TIBC, IRON, RETICCTPCT in the last 72 hours. Urinalysis    Component Value Date/Time   COLORURINE AMBER (A) 11/25/2017 1632   APPEARANCEUR CLOUDY (A) 11/25/2017 1632   LABSPEC 1.021 11/25/2017 1632   PHURINE 5.0 11/25/2017 1632   GLUCOSEU NEGATIVE 11/25/2017 1632   HGBUR NEGATIVE 11/25/2017 1632   BILIRUBINUR SMALL (A) 11/25/2017 1632   KETONESUR NEGATIVE 11/25/2017 1632   PROTEINUR 30 (A) 11/25/2017 1632   NITRITE NEGATIVE 11/25/2017 1632   LEUKOCYTESUR MODERATE (A) 11/25/2017 1632   Sepsis Labs Invalid input(s): PROCALCITONIN,  WBC,  LACTICIDVEN Microbiology Recent Results (from the past 240 hour(s))  Culture, blood (Routine x 2)     Status: None (Preliminary result)   Collection Time: 12/29/17  8:00 AM  Result Value Ref Range Status   Specimen Description BLOOD RIGHT ARM  Final   Special Requests   Final    BOTTLES DRAWN AEROBIC AND ANAEROBIC Blood Culture adequate volume   Culture   Final    NO GROWTH 3 DAYS Performed at [facility], [address], [place], [place] [address]    Report Status PENDING  Incomplete  Culture, blood (Routine x 2)     Status: None (Preliminary result)   Collection Time: 12/29/17   8:05 AM  Result Value Ref Range Status   Specimen Description BLOOD RIGHT HAND  Final   Special Requests   Final    BOTTLES DRAWN AEROBIC AND ANAEROBIC Blood Culture adequate volume   Culture   Final    NO GROWTH 3 DAYS Performed at [facility], [address], [place], [place] [address]    Report Status PENDING  Incomplete  MRSA PCR Screening  Status: Abnormal   Collection Time: 12/31/17  4:50 PM  Result Value Ref Range Status   MRSA by PCR POSITIVE (A) NEGATIVE Final    Comment:        The GeneXpert MRSA Assay (FDA approved for NASAL specimens only), is one component of a comprehensive MRSA colonization surveillance program. It is not intended to diagnose MRSA infection nor to guide or monitor treatment for MRSA infections. NOTIFIED NANCY RN 3A OF RESULT 12/31/17 @ 1900 Performed at [facility], [address], [place], [place] [address]    Time coordinating discharge: 33 minutes   SIGNED:  Irwin Brakeman, MD  [facility] 01/01/2018, 4:42 PM Pager [phone]  If 7PM-7AM, please contact night-coverage [email or web address] Password [email or web address]

## 2018-01-03 LAB — CULTURE, BLOOD (ROUTINE X 2)
Culture: NO GROWTH
Culture: NO GROWTH
SPECIAL REQUESTS: ADEQUATE
Special Requests: ADEQUATE

## 2018-01-04 ENCOUNTER — Ambulatory Visit: Payer: Medicare Other | Admitting: Physician Assistant

## 2018-01-04 DIAGNOSIS — J189 Pneumonia, unspecified organism: Secondary | ICD-10-CM | POA: Diagnosis not present

## 2018-01-04 DIAGNOSIS — C3490 Malignant neoplasm of unspecified part of unspecified bronchus or lung: Secondary | ICD-10-CM | POA: Diagnosis not present

## 2018-01-04 DIAGNOSIS — J438 Other emphysema: Secondary | ICD-10-CM | POA: Diagnosis not present

## 2018-01-04 DIAGNOSIS — I2510 Atherosclerotic heart disease of native coronary artery without angina pectoris: Secondary | ICD-10-CM | POA: Diagnosis not present

## 2018-01-06 DIAGNOSIS — J438 Other emphysema: Secondary | ICD-10-CM | POA: Diagnosis not present

## 2018-01-06 DIAGNOSIS — I2510 Atherosclerotic heart disease of native coronary artery without angina pectoris: Secondary | ICD-10-CM | POA: Diagnosis not present

## 2018-01-06 DIAGNOSIS — J189 Pneumonia, unspecified organism: Secondary | ICD-10-CM | POA: Diagnosis not present

## 2018-01-06 DIAGNOSIS — I10 Essential (primary) hypertension: Secondary | ICD-10-CM | POA: Diagnosis not present

## 2018-01-07 ENCOUNTER — Ambulatory Visit: Payer: Medicare Other | Admitting: Pulmonary Disease

## 2018-01-08 ENCOUNTER — Encounter (HOSPITAL_COMMUNITY): Payer: Self-pay | Admitting: Hematology

## 2018-01-08 NOTE — Progress Notes (Signed)
Multiple failed attempts to reach patient have been made since 01/04/2018.  No answer and no voicemail set up for primary number listed.  Called and spoke with patient's daughter, Freda Munro, who stated he was a resident at the [facility] in [place].  Called the [facility] and spoke with Inez Catalina (resident scheduler).  Per Inez Catalina, patient stated he would call me once he was discharged from [facility] (potentially the week before Christmas) to schedule an appointment.  I will also attempt to recall patient if I have not heard back from him by 01/15/18.

## 2018-01-11 DIAGNOSIS — J189 Pneumonia, unspecified organism: Secondary | ICD-10-CM | POA: Diagnosis not present

## 2018-01-11 DIAGNOSIS — J438 Other emphysema: Secondary | ICD-10-CM | POA: Diagnosis not present

## 2018-01-11 DIAGNOSIS — I2510 Atherosclerotic heart disease of native coronary artery without angina pectoris: Secondary | ICD-10-CM | POA: Diagnosis not present

## 2018-01-11 DIAGNOSIS — I10 Essential (primary) hypertension: Secondary | ICD-10-CM | POA: Diagnosis not present

## 2018-01-12 DIAGNOSIS — J449 Chronic obstructive pulmonary disease, unspecified: Secondary | ICD-10-CM | POA: Diagnosis not present

## 2018-01-12 DIAGNOSIS — I2510 Atherosclerotic heart disease of native coronary artery without angina pectoris: Secondary | ICD-10-CM | POA: Diagnosis not present

## 2018-01-12 DIAGNOSIS — J439 Emphysema, unspecified: Secondary | ICD-10-CM | POA: Diagnosis not present

## 2018-01-12 DIAGNOSIS — Z79899 Other long term (current) drug therapy: Secondary | ICD-10-CM | POA: Diagnosis not present

## 2018-01-12 DIAGNOSIS — I10 Essential (primary) hypertension: Secondary | ICD-10-CM | POA: Diagnosis not present

## 2018-01-12 DIAGNOSIS — I700 Atherosclerosis of aorta: Secondary | ICD-10-CM | POA: Diagnosis not present

## 2018-01-12 DIAGNOSIS — M545 Low back pain: Secondary | ICD-10-CM | POA: Diagnosis not present

## 2018-01-12 LAB — FUNGUS CULTURE RESULT

## 2018-01-12 LAB — FUNGUS CULTURE WITH STAIN

## 2018-01-12 LAB — FUNGAL ORGANISM REFLEX

## 2018-01-13 DIAGNOSIS — Z7951 Long term (current) use of inhaled steroids: Secondary | ICD-10-CM | POA: Diagnosis not present

## 2018-01-13 DIAGNOSIS — Z96641 Presence of right artificial hip joint: Secondary | ICD-10-CM | POA: Diagnosis not present

## 2018-01-13 DIAGNOSIS — Z96652 Presence of left artificial knee joint: Secondary | ICD-10-CM | POA: Diagnosis not present

## 2018-01-13 DIAGNOSIS — C3492 Malignant neoplasm of unspecified part of left bronchus or lung: Secondary | ICD-10-CM | POA: Diagnosis not present

## 2018-01-13 DIAGNOSIS — M545 Low back pain: Secondary | ICD-10-CM | POA: Diagnosis not present

## 2018-01-13 DIAGNOSIS — Z7982 Long term (current) use of aspirin: Secondary | ICD-10-CM | POA: Diagnosis not present

## 2018-01-13 DIAGNOSIS — J438 Other emphysema: Secondary | ICD-10-CM | POA: Diagnosis not present

## 2018-01-13 DIAGNOSIS — G629 Polyneuropathy, unspecified: Secondary | ICD-10-CM | POA: Diagnosis not present

## 2018-01-13 DIAGNOSIS — G8929 Other chronic pain: Secondary | ICD-10-CM | POA: Diagnosis not present

## 2018-01-13 DIAGNOSIS — E785 Hyperlipidemia, unspecified: Secondary | ICD-10-CM | POA: Diagnosis not present

## 2018-01-13 DIAGNOSIS — N3281 Overactive bladder: Secondary | ICD-10-CM | POA: Diagnosis not present

## 2018-01-13 DIAGNOSIS — Z9181 History of falling: Secondary | ICD-10-CM | POA: Diagnosis not present

## 2018-01-13 DIAGNOSIS — I10 Essential (primary) hypertension: Secondary | ICD-10-CM | POA: Diagnosis not present

## 2018-01-13 DIAGNOSIS — I2510 Atherosclerotic heart disease of native coronary artery without angina pectoris: Secondary | ICD-10-CM | POA: Diagnosis not present

## 2018-01-15 DIAGNOSIS — N3281 Overactive bladder: Secondary | ICD-10-CM | POA: Diagnosis not present

## 2018-01-15 DIAGNOSIS — S72009A Fracture of unspecified part of neck of unspecified femur, initial encounter for closed fracture: Secondary | ICD-10-CM | POA: Diagnosis not present

## 2018-01-15 DIAGNOSIS — J438 Other emphysema: Secondary | ICD-10-CM | POA: Diagnosis not present

## 2018-01-15 DIAGNOSIS — C3492 Malignant neoplasm of unspecified part of left bronchus or lung: Secondary | ICD-10-CM | POA: Diagnosis not present

## 2018-01-15 DIAGNOSIS — G629 Polyneuropathy, unspecified: Secondary | ICD-10-CM | POA: Diagnosis not present

## 2018-01-15 DIAGNOSIS — J189 Pneumonia, unspecified organism: Secondary | ICD-10-CM | POA: Diagnosis not present

## 2018-01-15 DIAGNOSIS — I10 Essential (primary) hypertension: Secondary | ICD-10-CM | POA: Diagnosis not present

## 2018-01-15 DIAGNOSIS — I2510 Atherosclerotic heart disease of native coronary artery without angina pectoris: Secondary | ICD-10-CM | POA: Diagnosis not present

## 2018-01-15 DIAGNOSIS — E785 Hyperlipidemia, unspecified: Secondary | ICD-10-CM | POA: Diagnosis not present

## 2018-01-15 DIAGNOSIS — Z7951 Long term (current) use of inhaled steroids: Secondary | ICD-10-CM | POA: Diagnosis not present

## 2018-01-15 DIAGNOSIS — Z96641 Presence of right artificial hip joint: Secondary | ICD-10-CM | POA: Diagnosis not present

## 2018-01-15 DIAGNOSIS — G8929 Other chronic pain: Secondary | ICD-10-CM | POA: Diagnosis not present

## 2018-01-15 DIAGNOSIS — Z96652 Presence of left artificial knee joint: Secondary | ICD-10-CM | POA: Diagnosis not present

## 2018-01-15 DIAGNOSIS — M545 Low back pain: Secondary | ICD-10-CM | POA: Diagnosis not present

## 2018-01-15 DIAGNOSIS — Z7982 Long term (current) use of aspirin: Secondary | ICD-10-CM | POA: Diagnosis not present

## 2018-01-15 DIAGNOSIS — Z9181 History of falling: Secondary | ICD-10-CM | POA: Diagnosis not present

## 2018-01-19 DIAGNOSIS — C3492 Malignant neoplasm of unspecified part of left bronchus or lung: Secondary | ICD-10-CM | POA: Diagnosis not present

## 2018-01-19 DIAGNOSIS — M545 Low back pain: Secondary | ICD-10-CM | POA: Diagnosis not present

## 2018-01-19 DIAGNOSIS — Z9181 History of falling: Secondary | ICD-10-CM | POA: Diagnosis not present

## 2018-01-19 DIAGNOSIS — Z96652 Presence of left artificial knee joint: Secondary | ICD-10-CM | POA: Diagnosis not present

## 2018-01-19 DIAGNOSIS — N3281 Overactive bladder: Secondary | ICD-10-CM | POA: Diagnosis not present

## 2018-01-19 DIAGNOSIS — I2510 Atherosclerotic heart disease of native coronary artery without angina pectoris: Secondary | ICD-10-CM | POA: Diagnosis not present

## 2018-01-19 DIAGNOSIS — J438 Other emphysema: Secondary | ICD-10-CM | POA: Diagnosis not present

## 2018-01-19 DIAGNOSIS — E785 Hyperlipidemia, unspecified: Secondary | ICD-10-CM | POA: Diagnosis not present

## 2018-01-19 DIAGNOSIS — G629 Polyneuropathy, unspecified: Secondary | ICD-10-CM | POA: Diagnosis not present

## 2018-01-19 DIAGNOSIS — Z7951 Long term (current) use of inhaled steroids: Secondary | ICD-10-CM | POA: Diagnosis not present

## 2018-01-19 DIAGNOSIS — Z7982 Long term (current) use of aspirin: Secondary | ICD-10-CM | POA: Diagnosis not present

## 2018-01-19 DIAGNOSIS — I10 Essential (primary) hypertension: Secondary | ICD-10-CM | POA: Diagnosis not present

## 2018-01-19 DIAGNOSIS — G8929 Other chronic pain: Secondary | ICD-10-CM | POA: Diagnosis not present

## 2018-01-19 DIAGNOSIS — Z96641 Presence of right artificial hip joint: Secondary | ICD-10-CM | POA: Diagnosis not present

## 2018-01-22 DIAGNOSIS — K529 Noninfective gastroenteritis and colitis, unspecified: Secondary | ICD-10-CM | POA: Diagnosis not present

## 2018-01-22 DIAGNOSIS — K769 Liver disease, unspecified: Secondary | ICD-10-CM | POA: Diagnosis not present

## 2018-01-22 DIAGNOSIS — Z85118 Personal history of other malignant neoplasm of bronchus and lung: Secondary | ICD-10-CM | POA: Diagnosis not present

## 2018-01-22 DIAGNOSIS — R0689 Other abnormalities of breathing: Secondary | ICD-10-CM | POA: Diagnosis not present

## 2018-01-22 DIAGNOSIS — R05 Cough: Secondary | ICD-10-CM | POA: Diagnosis not present

## 2018-01-22 DIAGNOSIS — N200 Calculus of kidney: Secondary | ICD-10-CM | POA: Diagnosis not present

## 2018-01-22 DIAGNOSIS — R110 Nausea: Secondary | ICD-10-CM | POA: Diagnosis not present

## 2018-01-22 DIAGNOSIS — J189 Pneumonia, unspecified organism: Secondary | ICD-10-CM | POA: Diagnosis not present

## 2018-01-22 DIAGNOSIS — R197 Diarrhea, unspecified: Secondary | ICD-10-CM | POA: Diagnosis not present

## 2018-01-28 DIAGNOSIS — Z96652 Presence of left artificial knee joint: Secondary | ICD-10-CM | POA: Diagnosis not present

## 2018-01-28 DIAGNOSIS — G629 Polyneuropathy, unspecified: Secondary | ICD-10-CM | POA: Diagnosis not present

## 2018-01-28 DIAGNOSIS — I10 Essential (primary) hypertension: Secondary | ICD-10-CM | POA: Diagnosis not present

## 2018-01-28 DIAGNOSIS — G8929 Other chronic pain: Secondary | ICD-10-CM | POA: Diagnosis not present

## 2018-01-28 DIAGNOSIS — E785 Hyperlipidemia, unspecified: Secondary | ICD-10-CM | POA: Diagnosis not present

## 2018-01-28 DIAGNOSIS — Z9181 History of falling: Secondary | ICD-10-CM | POA: Diagnosis not present

## 2018-01-28 DIAGNOSIS — C3492 Malignant neoplasm of unspecified part of left bronchus or lung: Secondary | ICD-10-CM | POA: Diagnosis not present

## 2018-01-28 DIAGNOSIS — Z7951 Long term (current) use of inhaled steroids: Secondary | ICD-10-CM | POA: Diagnosis not present

## 2018-01-28 DIAGNOSIS — I2510 Atherosclerotic heart disease of native coronary artery without angina pectoris: Secondary | ICD-10-CM | POA: Diagnosis not present

## 2018-01-28 DIAGNOSIS — J438 Other emphysema: Secondary | ICD-10-CM | POA: Diagnosis not present

## 2018-01-28 DIAGNOSIS — M545 Low back pain: Secondary | ICD-10-CM | POA: Diagnosis not present

## 2018-01-28 DIAGNOSIS — N3281 Overactive bladder: Secondary | ICD-10-CM | POA: Diagnosis not present

## 2018-01-28 DIAGNOSIS — Z96641 Presence of right artificial hip joint: Secondary | ICD-10-CM | POA: Diagnosis not present

## 2018-01-28 DIAGNOSIS — Z7982 Long term (current) use of aspirin: Secondary | ICD-10-CM | POA: Diagnosis not present

## 2018-01-28 LAB — ACID FAST CULTURE WITH REFLEXED SENSITIVITIES (MYCOBACTERIA): Acid Fast Culture: NEGATIVE

## 2018-01-29 ENCOUNTER — Inpatient Hospital Stay (HOSPITAL_COMMUNITY): Payer: Medicare Other | Attending: Hematology | Admitting: Hematology

## 2018-01-29 ENCOUNTER — Encounter (HOSPITAL_COMMUNITY): Payer: Self-pay | Admitting: Hematology

## 2018-01-29 ENCOUNTER — Other Ambulatory Visit: Payer: Self-pay

## 2018-01-29 VITALS — BP 133/76 | HR 77 | Temp 97.6°F | Resp 18 | Wt 155.2 lb

## 2018-01-29 DIAGNOSIS — N3281 Overactive bladder: Secondary | ICD-10-CM | POA: Diagnosis not present

## 2018-01-29 DIAGNOSIS — I2510 Atherosclerotic heart disease of native coronary artery without angina pectoris: Secondary | ICD-10-CM | POA: Diagnosis not present

## 2018-01-29 DIAGNOSIS — I10 Essential (primary) hypertension: Secondary | ICD-10-CM | POA: Diagnosis not present

## 2018-01-29 DIAGNOSIS — Z79899 Other long term (current) drug therapy: Secondary | ICD-10-CM | POA: Insufficient documentation

## 2018-01-29 DIAGNOSIS — Z7982 Long term (current) use of aspirin: Secondary | ICD-10-CM | POA: Insufficient documentation

## 2018-01-29 DIAGNOSIS — C3432 Malignant neoplasm of lower lobe, left bronchus or lung: Secondary | ICD-10-CM | POA: Diagnosis not present

## 2018-01-29 DIAGNOSIS — M545 Low back pain: Secondary | ICD-10-CM | POA: Diagnosis not present

## 2018-01-29 DIAGNOSIS — E785 Hyperlipidemia, unspecified: Secondary | ICD-10-CM | POA: Diagnosis not present

## 2018-01-29 DIAGNOSIS — C3492 Malignant neoplasm of unspecified part of left bronchus or lung: Secondary | ICD-10-CM | POA: Diagnosis not present

## 2018-01-29 DIAGNOSIS — Z9181 History of falling: Secondary | ICD-10-CM | POA: Diagnosis not present

## 2018-01-29 DIAGNOSIS — Z96652 Presence of left artificial knee joint: Secondary | ICD-10-CM | POA: Diagnosis not present

## 2018-01-29 DIAGNOSIS — G629 Polyneuropathy, unspecified: Secondary | ICD-10-CM | POA: Diagnosis not present

## 2018-01-29 DIAGNOSIS — F17210 Nicotine dependence, cigarettes, uncomplicated: Secondary | ICD-10-CM | POA: Insufficient documentation

## 2018-01-29 DIAGNOSIS — Z7951 Long term (current) use of inhaled steroids: Secondary | ICD-10-CM | POA: Diagnosis not present

## 2018-01-29 DIAGNOSIS — J438 Other emphysema: Secondary | ICD-10-CM | POA: Diagnosis not present

## 2018-01-29 DIAGNOSIS — G8929 Other chronic pain: Secondary | ICD-10-CM | POA: Diagnosis not present

## 2018-01-29 DIAGNOSIS — C3490 Malignant neoplasm of unspecified part of unspecified bronchus or lung: Secondary | ICD-10-CM

## 2018-01-29 DIAGNOSIS — Z96641 Presence of right artificial hip joint: Secondary | ICD-10-CM | POA: Diagnosis not present

## 2018-01-29 NOTE — Progress Notes (Signed)
Bradley [name], Bradley [name]   CLINIC:  Medical Oncology/Hematology  PCP:  Jani Gravel, MD [place] [address] [phone]   REASON FOR VISIT: Follow-up for Non-small cell lung cancer, adenocarcinoma type  CURRENT THERAPY: Work up   INTERVAL HISTORY:  Bradley [name] 78 y.o. male returns for routine follow-up for non small cell lung cancer. He is here today with his wife. He is breathing better and off oxygen. He is still SOB with exertion. He walks with his cane and performs all his own ADLs and activities. Denies any nausea, vomiting, or diarrhea. Denies any new pains. Had not noticed any recent bleeding such as epistaxis, hematuria or hematochezia. Denies recent chest pain on exertion, pre-syncopal episodes, or palpitations. Denies any numbness or tingling in hands or feet. Denies any recent fevers, infections, or recent hospitalizations. He reports his appetite is 100% and his energy level is 50%.    REVIEW OF SYSTEMS:  Review of Systems  Respiratory: Positive for shortness of breath.   Cardiovascular: Positive for chest pain.  All other systems reviewed and are negative.    PAST MEDICAL/SURGICAL HISTORY:  Past Medical History:  Diagnosis Date  . Cancer ([place])    lung  . Chronic lower back pain   . Chronic pain    pain management  . Contact with stonefish as cause of accidental injury 1954   "stung across my left hand in [place]; LUE weaker since"  . COPD (chronic obstructive pulmonary disease) ([place])   . Coronary artery disease   . Dyspnea    with exertion  . High cholesterol   . History of blood transfusion 1943  . History of kidney stones    passed  . Hypertension   . Mass of left lung   . Migraine    "none since the 1990s" (09/11/2016)  . Myocardial infarction ([place]) 01/20/1993; 02/28/1993  . Neuromuscular disorder ([facility])    neuropathy left  . Pneumonia    "several times" (09/11/2016)  . Tobacco  abuse    Past Surgical History:  Procedure Laterality Date  . APPENDECTOMY    . CARDIAC CATHETERIZATION    . CATARACT EXTRACTION, BILATERAL Bilateral   . COLONOSCOPY    . HIP ARTHROPLASTY Right 12/20/2016   Procedure: ARTHROPLASTY BIPOLAR HIP (HEMIARTHROPLASTY);  Surgeon: [name], MD;  Location: [facility];  Service: Orthopedics;  Laterality: Right;  . JOINT REPLACEMENT    . LEFT HEART CATH AND CORONARY ANGIOGRAPHY N/A 09/12/2016   Procedure: LEFT HEART CATH AND CORONARY ANGIOGRAPHY;  Surgeon: Burnell Blanks, MD;  Location: [facility];  Service: Cardiovascular;  Laterality: N/A;  . NASAL ENDOSCOPY WITH EPISTAXIS CONTROL Right 03/15/2016   Procedure: NASAL ENDOSCOPY WITH EPISTAXIS CONTROL;  Surgeon: Jodi Marble, MD;  Location: [facility];  Service: ENT;  Laterality: Right;  . SEPTOPLASTY Right 03/15/2016   Procedure: SEPTOPLASTY;  Surgeon: Jodi Marble, MD;  Location: [facility];  Service: ENT;  Laterality: Right;  . SINUS EXPLORATION Right 03/15/2016   Procedure: SINUS EXPLORATION;  Surgeon: Jodi Marble, MD;  Location: [facility];  Service: ENT;  Laterality: Right;  . TONSILLECTOMY    . TOOTH EXTRACTION    . TOTAL KNEE ARTHROPLASTY Left   . VIDEO BRONCHOSCOPY WITH ENDOBRONCHIAL ULTRASOUND N/A 12/15/2017   Procedure: VIDEO BRONCHOSCOPY WITH ENDOBRONCHIAL ULTRASOUND;  Surgeon: Rigoberto Noel, MD;  Location: [facility];  Service: Thoracic;  Laterality: N/A;     SOCIAL HISTORY:  Social History   Socioeconomic History  . Marital status: Widowed    Spouse name: Not on file  . Number of children: Not on file  . Years of education: Not on file  . Highest education level: Not on file  Occupational History  . Not on file  Social Needs  . Financial resource strain: Not on file  . Food insecurity:    Worry: Not on file    Inability: Not on file  . Transportation needs:    Medical: Not on file    Non-medical: Not on file  Tobacco Use  . Smoking status: Current Every Day Smoker     Packs/day: 0.40    Years: 70.00    Pack years: 28.00    Types: Cigarettes  . Smokeless tobacco: Never Used  . Tobacco comment: smokes 8 a day  Substance and Sexual Activity  . Alcohol use: No    Comment: 11/26/2017  "drank from the age of 8 til 03/05/1993"  . Drug use: Not Currently    Comment: 11/26/2017 "used some drugs when I was young"  . Sexual activity: Not Currently  Lifestyle  . Physical activity:    Days per week: Not on file    Minutes per session: Not on file  . Stress: Not on file  Relationships  . Social connections:    Talks on phone: Not on file    Gets together: Not on file    Attends religious service: Not on file    Active member of club or organization: Not on file    Attends meetings of clubs or organizations: Not on file    Relationship status: Not on file  . Intimate partner violence:    Fear of current or ex partner: Not on file    Emotionally abused: Not on file    Physically abused: Not on file    Forced sexual activity: Not on file  Other Topics Concern  . Not on file  Social History Narrative  . Not on file    FAMILY HISTORY:  Family History  Adopted: Yes  Problem Relation Age of Onset  . Cancer Mother   . Obesity Father     CURRENT MEDICATIONS:  Outpatient Encounter Medications as of 01/29/2018  Medication Sig Note  . ADVAIR HFA 115-21 MCG/ACT inhaler Inhale 2 puffs into the lungs every 12 (twelve) hours.   [name] amLODipine (NORVASC) 5 MG tablet TAKE 1 TABLET BY MOUTH EVERY DAY (Patient taking differently: Take 5 mg by mouth daily. )   . Ascorbic Acid (VITAMIN C) 500 MG CAPS Take 500 mg by mouth daily.   [name] aspirin EC 81 MG tablet Take 1 tablet by mouth daily.   . celecoxib (CELEBREX) 200 MG capsule Take 1 capsule (200 mg total) by mouth daily.   . cyclobenzaprine (FLEXERIL) 5 MG tablet Take 5 mg by mouth 2 (two) times daily.    . Ferrous Sulfate (IRON) 325 (65 Fe) MG TABS Take 1 tablet by mouth daily.   . fluticasone (FLONASE) 50 MCG/ACT nasal  spray Place 2 sprays into both nostrils daily.   [name] gabapentin (NEURONTIN) 300 MG capsule Take 1 capsule by mouth 3 (three) times daily.   [name] ipratropium-albuterol (DUONEB) 0.5-2.5 (3) MG/3ML SOLN Take 3 mLs by nebulization 3 (three) times daily.   [name] lisinopril (PRINIVIL,ZESTRIL) 20 MG tablet Take 0.5 tablets (10 mg total) by mouth daily.   . metoCLOPramide (REGLAN) 5 MG tablet Take 1 tablet by mouth 4 (four) times daily -  before meals and at bedtime.   . metoprolol succinate (TOPROL-XL) 25 MG 24 hr tablet Take 25 mg by mouth daily. 12/29/2017: Pt stated he last took meds after dinner lastnight but could not recall the time  . mirabegron ER (MYRBETRIQ) 25 MG TB24 tablet Take 25 mg by mouth daily.   . montelukast (SINGULAIR) 10 MG tablet Take 10 mg by mouth daily.   [name] omeprazole (PRILOSEC) 40 MG capsule Take 40 mg by mouth daily.    [name] oxyCODONE (OXY IR/ROXICODONE) 5 MG immediate release tablet Take 1 tablet (5 mg total) by mouth every 4 (four) hours as needed for severe pain.   . simvastatin (ZOCOR) 10 MG tablet Take 10 mg by mouth daily.   . VENTOLIN HFA 108 (90 Base) MCG/ACT inhaler Inhale 2 puffs into the lungs every 4 (four) hours as needed for wheezing or shortness of breath.    [name] buPROPion (WELLBUTRIN SR) 150 MG 12 hr tablet Take 1 tablet (150 mg total) by mouth 2 (two) times daily. Take 1 tablet daily for 1 week, stop smoking, then take twice daily after first week.   . nitroGLYCERIN (NITROSTAT) 0.4 MG SL tablet Place 0.4 mg under the tongue every 5 (five) minutes as needed for chest pain.    No facility-administered encounter medications on file as of 01/29/2018.     ALLERGIES:  Allergies  Allergen Reactions  . Aspirin     Regular asa      PHYSICAL EXAM:  ECOG Performance status: 1  Vitals:   01/29/18 1000  BP: 133/76  Pulse: 77  Resp: 18  Temp: 97.6 F (36.4 C)  SpO2: 98%   Filed Weights   01/29/18 1000  Weight: 155 lb 4 oz (70.4 kg)    Physical Exam Constitutional:        Appearance: Normal appearance. He is normal weight.  Cardiovascular:     Rate and Rhythm: Normal rate and regular rhythm.     Heart sounds: Normal heart sounds.  Pulmonary:     Effort: Pulmonary effort is normal.     Breath sounds: Normal breath sounds.  Musculoskeletal: Normal range of motion.  Skin:    General: Skin is warm and dry.  Neurological:     Mental Status: He is alert and oriented to person, place, and time. Mental status is at baseline.  Psychiatric:        Mood and Affect: Mood normal.        Behavior: Behavior normal.        Thought Content: Thought content normal.        Judgment: Judgment normal.      LABORATORY DATA:  I have reviewed the labs as listed.  CBC    Component Value Date/Time   WBC 16.2 (H) 01/01/2018 0535   RBC 4.25 01/01/2018 0535   HGB 12.5 (L) 01/01/2018 0535   HCT 39.1 01/01/2018 0535   PLT 397 01/01/2018 0535   MCV 92.0 01/01/2018 0535   MCH 29.4 01/01/2018 0535   MCHC 32.0 01/01/2018 0535   RDW 16.6 (H) 01/01/2018 0535   LYMPHSABS 2.0 01/01/2018 0535   MONOABS 1.3 (H) 01/01/2018 0535   EOSABS 0.5 01/01/2018 0535   BASOSABS 0.1 01/01/2018 0535   CMP Latest Ref Rng & Units 12/30/2017 12/29/2017 12/15/2017  Glucose 70 - 99 mg/dL 80 116(H) 99  BUN 8 - 23 mg/dL 17 20 15   Creatinine 0.61 - 1.24 mg/dL 0.82 1.07 0.85  Sodium 135 - 145 mmol/L 137 135 138  Potassium 3.5 - 5.1 mmol/L 3.8 3.9 3.8  Chloride 98 - 111 mmol/L 109 105 106  CO2 22 - 32 mmol/L 22 22 23   Calcium 8.9 - 10.3 mg/dL 8.1(L) 8.6(L) 8.9  Total Protein 6.5 - 8.1 g/dL - 7.4 -  Total Bilirubin 0.3 - 1.2 mg/dL - 1.2 -  Alkaline Phos 38 - 126 U/L - 109 -  AST 15 - 41 U/L - 15 -  ALT 0 - 44 U/L - 12 -       DIAGNOSTIC IMAGING:  I have independently reviewed the scans and discussed with the patient.   I have reviewed Francene Finders, NP's note and agree with the documentation.  I personally performed a face-to-face visit, made revisions and my assessment and plan is  as follows.    ASSESSMENT & PLAN:   Non-small cell lung cancer ([place]) 1.  Non-small cell lung cancer, adenocarcinoma type: -he was evaluated by me as inpatient when he was hospitalized on 12/29/2017. - Left lower lobe endobronchial biopsy on 12/15/2017 shows non-small cell lung cancer, TTF-1 positive consistent with pulmonary adenocarcinoma. - He was reluctant to consider any chemotherapy when he was inpatient. -He also developed new lesions in the lung, most likely infectious -MRI of the brain on 12/29/2017 did not show any evidence of intracranial metastasis.. -Since his discharge, he did not experience any hemoptysis.  His breathing is much better.  He is eating well and did not lose any weight. -Hence I have recommended repeating a PET CT scan at this time.  That will help [place] and accurately staging his cancer. -We can discuss his staging, prognosis and treatment options after the scan.  Total time spent is 25 minutes with more than 50% of the time spent face-to-face discussing various options and coordination of care.    Orders placed this encounter:  Orders Placed This Encounter  Procedures  . NM PET Image Restag (PS) Skull Base To Thigh      Derek Jack, MD [place] [phone]

## 2018-01-29 NOTE — Assessment & Plan Note (Addendum)
1.  Non-small cell lung cancer, adenocarcinoma type: -he was evaluated by me as inpatient when he was hospitalized on 12/29/2017. - Left lower lobe endobronchial biopsy on 12/15/2017 shows non-small cell lung cancer, TTF-1 positive consistent with pulmonary adenocarcinoma. - He was reluctant to consider any chemotherapy when he was inpatient. -He also developed new lesions in the lung, most likely infectious -MRI of the brain on 12/29/2017 did not show any evidence of intracranial metastasis.. -Since his discharge, he did not experience any hemoptysis.  His breathing is much better.  He is eating well and did not lose any weight. -Hence I have recommended repeating a PET CT scan at this time.  That will help [place] and accurately staging his cancer. -We can discuss his staging, prognosis and treatment options after the scan.

## 2018-01-29 NOTE — Patient Instructions (Signed)
[facility] at [facility] Discharge Instructions     Thank you for choosing [facility] at [facility] to provide your oncology and hematology care.  To afford each patient quality time with our provider, please arrive at least 15 minutes before your scheduled appointment time.   If you have a lab appointment with the Cancer Center please come in thru the  Main Entrance and check in at the main information desk  You need to re-schedule your appointment should you arrive 10 or more minutes late.  We strive to give you quality time with our providers, and arriving late affects you and other patients whose appointments are after yours.  Also, if you no show three or more times for appointments you may be dismissed from the clinic at the providers discretion.     Again, thank you for choosing [facility].  Our hope is that these requests will decrease the amount of time that you wait before being seen by our physicians.       _____________________________________________________________  Should you have questions after your visit to [facility], please contact our office at [phone] between the hours of 8:00 a.m. and 4:30 p.m.  Voicemails left after 4:00 p.m. will not be returned until the following business day.  For prescription refill requests, have your pharmacy contact our office and allow 72 hours.    Cancer Center Support Programs:   > Cancer Support Group  2nd Tuesday of the month 1pm-2pm, Journey Room    

## 2018-02-02 DIAGNOSIS — C3492 Malignant neoplasm of unspecified part of left bronchus or lung: Secondary | ICD-10-CM | POA: Diagnosis not present

## 2018-02-02 DIAGNOSIS — Z96652 Presence of left artificial knee joint: Secondary | ICD-10-CM | POA: Diagnosis not present

## 2018-02-02 DIAGNOSIS — M545 Low back pain: Secondary | ICD-10-CM | POA: Diagnosis not present

## 2018-02-02 DIAGNOSIS — I10 Essential (primary) hypertension: Secondary | ICD-10-CM | POA: Diagnosis not present

## 2018-02-02 DIAGNOSIS — Z7951 Long term (current) use of inhaled steroids: Secondary | ICD-10-CM | POA: Diagnosis not present

## 2018-02-02 DIAGNOSIS — G629 Polyneuropathy, unspecified: Secondary | ICD-10-CM | POA: Diagnosis not present

## 2018-02-02 DIAGNOSIS — Z9181 History of falling: Secondary | ICD-10-CM | POA: Diagnosis not present

## 2018-02-02 DIAGNOSIS — E785 Hyperlipidemia, unspecified: Secondary | ICD-10-CM | POA: Diagnosis not present

## 2018-02-02 DIAGNOSIS — I2510 Atherosclerotic heart disease of native coronary artery without angina pectoris: Secondary | ICD-10-CM | POA: Diagnosis not present

## 2018-02-02 DIAGNOSIS — Z96641 Presence of right artificial hip joint: Secondary | ICD-10-CM | POA: Diagnosis not present

## 2018-02-02 DIAGNOSIS — G8929 Other chronic pain: Secondary | ICD-10-CM | POA: Diagnosis not present

## 2018-02-02 DIAGNOSIS — Z7982 Long term (current) use of aspirin: Secondary | ICD-10-CM | POA: Diagnosis not present

## 2018-02-02 DIAGNOSIS — N3281 Overactive bladder: Secondary | ICD-10-CM | POA: Diagnosis not present

## 2018-02-02 DIAGNOSIS — J438 Other emphysema: Secondary | ICD-10-CM | POA: Diagnosis not present

## 2018-02-09 DIAGNOSIS — M25551 Pain in right hip: Secondary | ICD-10-CM | POA: Diagnosis not present

## 2018-02-09 DIAGNOSIS — Z79899 Other long term (current) drug therapy: Secondary | ICD-10-CM | POA: Diagnosis not present

## 2018-02-09 DIAGNOSIS — Z79891 Long term (current) use of opiate analgesic: Secondary | ICD-10-CM | POA: Diagnosis not present

## 2018-02-09 DIAGNOSIS — J449 Chronic obstructive pulmonary disease, unspecified: Secondary | ICD-10-CM | POA: Diagnosis not present

## 2018-02-09 DIAGNOSIS — M25562 Pain in left knee: Secondary | ICD-10-CM | POA: Diagnosis not present

## 2018-02-09 DIAGNOSIS — M545 Low back pain: Secondary | ICD-10-CM | POA: Diagnosis not present

## 2018-02-09 DIAGNOSIS — C3490 Malignant neoplasm of unspecified part of unspecified bronchus or lung: Secondary | ICD-10-CM | POA: Diagnosis not present

## 2018-02-15 ENCOUNTER — Ambulatory Visit (HOSPITAL_COMMUNITY)
Admission: RE | Admit: 2018-02-15 | Discharge: 2018-02-15 | Disposition: A | Discharge status: Home / Self Care | Payer: Medicare Other | Source: Ambulatory Visit | Attending: Nurse Practitioner | Admitting: Nurse Practitioner

## 2018-02-15 DIAGNOSIS — C3490 Malignant neoplasm of unspecified part of unspecified bronchus or lung: Secondary | ICD-10-CM

## 2018-02-15 DIAGNOSIS — J189 Pneumonia, unspecified organism: Secondary | ICD-10-CM | POA: Diagnosis not present

## 2018-02-15 MED ORDER — FLUDEOXYGLUCOSE F - 18 (FDG) INJECTION
9.8700 | Freq: Once | INTRAVENOUS | Status: AC | PRN
  Administered 2018-02-15: 9.87 via INTRAVENOUS

## 2018-02-18 ENCOUNTER — Inpatient Hospital Stay (HOSPITAL_BASED_OUTPATIENT_CLINIC_OR_DEPARTMENT_OTHER): Payer: Medicare Other | Admitting: Hematology

## 2018-02-18 ENCOUNTER — Encounter (HOSPITAL_COMMUNITY): Payer: Self-pay | Admitting: *Deleted

## 2018-02-18 ENCOUNTER — Encounter (HOSPITAL_COMMUNITY): Payer: Self-pay | Admitting: Hematology

## 2018-02-18 ENCOUNTER — Other Ambulatory Visit: Payer: Self-pay

## 2018-02-18 DIAGNOSIS — I10 Essential (primary) hypertension: Secondary | ICD-10-CM

## 2018-02-18 DIAGNOSIS — F17210 Nicotine dependence, cigarettes, uncomplicated: Secondary | ICD-10-CM

## 2018-02-18 DIAGNOSIS — Z79899 Other long term (current) drug therapy: Secondary | ICD-10-CM | POA: Diagnosis not present

## 2018-02-18 DIAGNOSIS — Z7982 Long term (current) use of aspirin: Secondary | ICD-10-CM | POA: Diagnosis not present

## 2018-02-18 DIAGNOSIS — C3490 Malignant neoplasm of unspecified part of unspecified bronchus or lung: Secondary | ICD-10-CM

## 2018-02-18 DIAGNOSIS — C3432 Malignant neoplasm of lower lobe, left bronchus or lung: Secondary | ICD-10-CM

## 2018-02-18 NOTE — Progress Notes (Signed)
I spoke with Varney Biles in pathology and ordered Foundation One and PD-L1 for accession # [id number]. [id number]

## 2018-02-18 NOTE — Patient Instructions (Signed)
[facility] at [facility] Discharge Instructions     Thank you for choosing [facility] at [facility] to provide your oncology and hematology care.  To afford each patient quality time with our provider, please arrive at least 15 minutes before your scheduled appointment time.   If you have a lab appointment with the Cancer Center please come in thru the  Main Entrance and check in at the main information desk  You need to re-schedule your appointment should you arrive 10 or more minutes late.  We strive to give you quality time with our providers, and arriving late affects you and other patients whose appointments are after yours.  Also, if you no show three or more times for appointments you may be dismissed from the clinic at the providers discretion.     Again, thank you for choosing [facility].  Our hope is that these requests will decrease the amount of time that you wait before being seen by our physicians.       _____________________________________________________________  Should you have questions after your visit to [facility], please contact our office at [phone] between the hours of 8:00 a.m. and 4:30 p.m.  Voicemails left after 4:00 p.m. will not be returned until the following business day.  For prescription refill requests, have your pharmacy contact our office and allow 72 hours.    Cancer Center Support Programs:   > Cancer Support Group  2nd Tuesday of the month 1pm-2pm, Journey Room    

## 2018-02-18 NOTE — Assessment & Plan Note (Signed)
1.  Non-small cell lung cancer, adenocarcinoma type: -he was evaluated by me as inpatient when he was hospitalized on 12/29/2017. - Left lower lobe endobronchial biopsy on 12/15/2017 shows non-small cell lung cancer, TTF-1 positive consistent with pulmonary adenocarcinoma. - He was reluctant to consider any chemotherapy when he was inpatient. -He also developed new lesions in the lung, most likely infectious -MRI of the brain on 12/29/2017 did not show any evidence of intracranial metastasis.. -Since his discharge, he did not experience any hemoptysis.  His breathing is much better.  He is eating well and did not lose any weight. - He is walking around the house and is able to do all his activities. -We reviewed the results of the PET scan dated 02/16/2018 which shows dominant perihilar left lung mass is mildly increased in size.  There is a new hypermetabolic nodule in the left upper lobe and new hypermetabolic lesion in the dome of the liver.  Right upper lobe lung nodules have slightly decreased in size.  Similar appearance of hypermetabolic mediastinal and hilar adenopathy. - We discussed the normal prognosis of metastatic lung cancer.  He is reluctant to consider any chemotherapy.  However he is okay to have immunotherapy and targeted therapies. -We have recommended checking his PDL 1 and foundation 1 testing.  We will see him back in 2 weeks to discuss the results and further plan of treatment.

## 2018-02-18 NOTE — Progress Notes (Signed)
[place], [place] [address]   CLINIC:  Medical Oncology/Hematology  PCP:  Jani Gravel, MD [place] [address] [phone]   REASON FOR VISIT: Follow-up for Non-small cell lung cancer, adenocarcinoma type  CURRENT THERAPY: Waiting for PDL 1 and foundation 1 results    INTERVAL HISTORY:  Bradley [name] 78 y.o. male returns for routine follow-up for non small cell lung cancer. He is here today with his daughter. He has no complaints and is eating well and has good energy. Denies any nausea, vomiting, or diarrhea. Denies any new pains. Had not noticed any recent bleeding such as epistaxis, hematuria or hematochezia. Denies recent chest pain on exertion, shortness of breath on minimal exertion, pre-syncopal episodes, or palpitations. Denies any numbness or tingling in hands or feet. Denies any recent fevers, infections, or recent hospitalizations. Patient reports appetite at 100% and energy level at 100%. He has no problems maintaining his weight at this time.    REVIEW OF SYSTEMS:  Review of Systems  All other systems reviewed and are negative.    PAST MEDICAL/SURGICAL HISTORY:  Past Medical History:  Diagnosis Date  . Cancer ([facility])    lung  . Chronic lower back pain   . Chronic pain    pain management  . Contact with stonefish as cause of accidental injury 1954   "stung across my left hand in [place]; LUE weaker since"  . COPD (chronic obstructive pulmonary disease) ([facility])   . Coronary artery disease   . Dyspnea    with exertion  . High cholesterol   . History of blood transfusion 1943  . History of kidney stones    passed  . Hypertension   . Mass of left lung   . Migraine    "none since the 1990s" (09/11/2016)  . Myocardial infarction ([facility]) 01/20/1993; 02/28/1993  . Neuromuscular disorder ([facility])    neuropathy left  . Pneumonia    "several times" (09/11/2016)  . Tobacco abuse    Past Surgical History:    Procedure Laterality Date  . APPENDECTOMY    . CARDIAC CATHETERIZATION    . CATARACT EXTRACTION, BILATERAL Bilateral   . COLONOSCOPY    . HIP ARTHROPLASTY Right 12/20/2016   Procedure: ARTHROPLASTY BIPOLAR HIP (HEMIARTHROPLASTY);  Surgeon: [name], MD;  Location: [facility];  Service: Orthopedics;  Laterality: Right;  . JOINT REPLACEMENT    . LEFT HEART CATH AND CORONARY ANGIOGRAPHY N/A 09/12/2016   Procedure: LEFT HEART CATH AND CORONARY ANGIOGRAPHY;  Surgeon: Burnell Blanks, MD;  Location: [facility];  Service: Cardiovascular;  Laterality: N/A;  . NASAL ENDOSCOPY WITH EPISTAXIS CONTROL Right 03/15/2016   Procedure: NASAL ENDOSCOPY WITH EPISTAXIS CONTROL;  Surgeon: Jodi Marble, MD;  Location: [facility];  Service: ENT;  Laterality: Right;  . SEPTOPLASTY Right 03/15/2016   Procedure: SEPTOPLASTY;  Surgeon: Jodi Marble, MD;  Location: [facility];  Service: ENT;  Laterality: Right;  . SINUS EXPLORATION Right 03/15/2016   Procedure: SINUS EXPLORATION;  Surgeon: Jodi Marble, MD;  Location: [facility];  Service: ENT;  Laterality: Right;  . TONSILLECTOMY    . TOOTH EXTRACTION    . TOTAL KNEE ARTHROPLASTY Left   . VIDEO BRONCHOSCOPY WITH ENDOBRONCHIAL ULTRASOUND N/A 12/15/2017   Procedure: VIDEO BRONCHOSCOPY WITH ENDOBRONCHIAL ULTRASOUND;  Surgeon: Rigoberto Noel, MD;  Location: [facility];  Service: Thoracic;  Laterality: N/A;     SOCIAL HISTORY:  Social History   Socioeconomic  History  . Marital status: Widowed    Spouse name: Not on file  . Number of children: Not on file  . Years of education: Not on file  . Highest education level: Not on file  Occupational History  . Not on file  Social Needs  . Financial resource strain: Not on file  . Food insecurity:    Worry: Not on file    Inability: Not on file  . Transportation needs:    Medical: Not on file    Non-medical: Not on file  Tobacco Use  . Smoking status: Current Every Day Smoker    Packs/day: 0.40    Years: 70.00     Pack years: 28.00    Types: Cigarettes  . Smokeless tobacco: Never Used  . Tobacco comment: smokes 8 a day  Substance and Sexual Activity  . Alcohol use: No    Comment: 11/26/2017  "drank from the age of 8 til 03/05/1993"  . Drug use: Not Currently    Comment: 11/26/2017 "used some drugs when I was young"  . Sexual activity: Not Currently  Lifestyle  . Physical activity:    Days per week: Not on file    Minutes per session: Not on file  . Stress: Not on file  Relationships  . Social connections:    Talks on phone: Not on file    Gets together: Not on file    Attends religious service: Not on file    Active member of club or organization: Not on file    Attends meetings of clubs or organizations: Not on file    Relationship status: Not on file  . Intimate partner violence:    Fear of current or ex partner: Not on file    Emotionally abused: Not on file    Physically abused: Not on file    Forced sexual activity: Not on file  Other Topics Concern  . Not on file  Social History Narrative  . Not on file    FAMILY HISTORY:  Family History  Adopted: Yes  Problem Relation Age of Onset  . Cancer Mother   . Obesity Father     CURRENT MEDICATIONS:  Outpatient Encounter Medications as of 02/18/2018  Medication Sig Note  . ADVAIR HFA 115-21 MCG/ACT inhaler Inhale 2 puffs into the lungs every 12 (twelve) hours.   [name] amLODipine (NORVASC) 5 MG tablet TAKE 1 TABLET BY MOUTH EVERY DAY (Patient taking differently: Take 5 mg by mouth daily. )   . Ascorbic Acid (VITAMIN C) 500 MG CAPS Take 500 mg by mouth daily.   [name] aspirin EC 81 MG tablet Take 1 tablet by mouth daily.   [name] buPROPion (WELLBUTRIN SR) 150 MG 12 hr tablet Take 1 tablet (150 mg total) by mouth 2 (two) times daily. Take 1 tablet daily for 1 week, stop smoking, then take twice daily after first week.   . celecoxib (CELEBREX) 200 MG capsule Take 1 capsule (200 mg total) by mouth daily.   . cyclobenzaprine (FLEXERIL) 5 MG tablet  Take 5 mg by mouth 2 (two) times daily.    . Ferrous Sulfate (IRON) 325 (65 Fe) MG TABS Take 1 tablet by mouth daily.   . fluticasone (FLONASE) 50 MCG/ACT nasal spray Place 2 sprays into both nostrils daily.   [name] gabapentin (NEURONTIN) 300 MG capsule Take 1 capsule by mouth 3 (three) times daily.   [name] ipratropium-albuterol (DUONEB) 0.5-2.5 (3) MG/3ML SOLN Take 3 mLs by nebulization 3 (three) times daily.   [name]  lisinopril (PRINIVIL,ZESTRIL) 20 MG tablet Take 0.5 tablets (10 mg total) by mouth daily.   . metoCLOPramide (REGLAN) 5 MG tablet Take 1 tablet by mouth 4 (four) times daily -  before meals and at bedtime.   . metoprolol succinate (TOPROL-XL) 25 MG 24 hr tablet Take 25 mg by mouth daily. 12/29/2017: Pt stated he last took meds after dinner lastnight but could not recall the time  . mirabegron ER (MYRBETRIQ) 25 MG TB24 tablet Take 25 mg by mouth daily.   . montelukast (SINGULAIR) 10 MG tablet Take 10 mg by mouth daily.   . Multiple Vitamins-Minerals (MULTIVITAMIN WITH MINERALS) tablet Take 1 tablet by mouth daily.   . nitroGLYCERIN (NITROSTAT) 0.4 MG SL tablet Place 0.4 mg under the tongue every 5 (five) minutes as needed for chest pain.   [name] omeprazole (PRILOSEC) 40 MG capsule Take 40 mg by mouth daily.    [name] oxyCODONE (OXY IR/ROXICODONE) 5 MG immediate release tablet Take 1 tablet (5 mg total) by mouth every 4 (four) hours as needed for severe pain.   . simvastatin (ZOCOR) 10 MG tablet Take 10 mg by mouth daily.   . VENTOLIN HFA 108 (90 Base) MCG/ACT inhaler Inhale 2 puffs into the lungs every 4 (four) hours as needed for wheezing or shortness of breath.     No facility-administered encounter medications on file as of 02/18/2018.     ALLERGIES:  Allergies  Allergen Reactions  . Aspirin     Regular asa      PHYSICAL EXAM:  ECOG Performance status: 1  Vitals:   02/18/18 1100  BP: (!) 157/87  Pulse: 76  Resp: 16  Temp: 97.6 F (36.4 C)  SpO2: 97%   Filed Weights   02/18/18 1100    Weight: 157 lb (71.2 kg)    Physical Exam Constitutional:      Appearance: Normal appearance. He is normal weight.  Musculoskeletal: Normal range of motion.  Skin:    General: Skin is warm and dry.  Neurological:     Mental Status: He is alert and oriented to person, place, and time. Mental status is at baseline.  Psychiatric:        Mood and Affect: Mood normal.        Behavior: Behavior normal.        Thought Content: Thought content normal.        Judgment: Judgment normal.      LABORATORY DATA:  I have reviewed the labs as listed.  CBC    Component Value Date/Time   WBC 16.2 (H) 01/01/2018 0535   RBC 4.25 01/01/2018 0535   HGB 12.5 (L) 01/01/2018 0535   HCT 39.1 01/01/2018 0535   PLT 397 01/01/2018 0535   MCV 92.0 01/01/2018 0535   MCH 29.4 01/01/2018 0535   MCHC 32.0 01/01/2018 0535   RDW 16.6 (H) 01/01/2018 0535   LYMPHSABS 2.0 01/01/2018 0535   MONOABS 1.3 (H) 01/01/2018 0535   EOSABS 0.5 01/01/2018 0535   BASOSABS 0.1 01/01/2018 0535   CMP Latest Ref Rng & Units 12/30/2017 12/29/2017 12/15/2017  Glucose 70 - 99 mg/dL 80 116(H) 99  BUN 8 - 23 mg/dL 17 20 15   Creatinine 0.61 - 1.24 mg/dL 0.82 1.07 0.85  Sodium 135 - 145 mmol/L 137 135 138  Potassium 3.5 - 5.1 mmol/L 3.8 3.9 3.8  Chloride 98 - 111 mmol/L 109 105 106  CO2 22 - 32 mmol/L 22 22 23   Calcium 8.9 - 10.3 mg/dL 8.1(L) 8.6(L)  8.9  Total Protein 6.5 - 8.1 g/dL - 7.4 -  Total Bilirubin 0.3 - 1.2 mg/dL - 1.2 -  Alkaline Phos 38 - 126 U/L - 109 -  AST 15 - 41 U/L - 15 -  ALT 0 - 44 U/L - 12 -       DIAGNOSTIC IMAGING:  I have independently reviewed the scans and discussed with the patient.   I have reviewed Francene Finders, NP's note and agree with the documentation.  I personally performed a face-to-face visit, made revisions and my assessment and plan is as follows.    ASSESSMENT & PLAN:   Non-small cell lung cancer ([facility]) 1.  Non-small cell lung cancer, adenocarcinoma type: -he was  evaluated by me as inpatient when he was hospitalized on 12/29/2017. - Left lower lobe endobronchial biopsy on 12/15/2017 shows non-small cell lung cancer, TTF-1 positive consistent with pulmonary adenocarcinoma. - He was reluctant to consider any chemotherapy when he was inpatient. -He also developed new lesions in the lung, most likely infectious -MRI of the brain on 12/29/2017 did not show any evidence of intracranial metastasis.. -Since his discharge, he did not experience any hemoptysis.  His breathing is much better.  He is eating well and did not lose any weight. - He is walking around the house and is able to do all his activities. -We reviewed the results of the PET scan dated 02/16/2018 which shows dominant perihilar left lung mass is mildly increased in size.  There is a new hypermetabolic nodule in the left upper lobe and new hypermetabolic lesion in the dome of the liver.  Right upper lobe lung nodules have slightly decreased in size.  Similar appearance of hypermetabolic mediastinal and hilar adenopathy. - We discussed the normal prognosis of metastatic lung cancer.  He is reluctant to consider any chemotherapy.  However he is okay to have immunotherapy and targeted therapies. -We have recommended checking his PDL 1 and foundation 1 testing.  We will see him back in 2 weeks to discuss the results and further plan of treatment. Total time spent is 25 minutes with more than 50% of the time spent face-to-face discussing scan results, reviewing scans with the patient and his daughter coordination of care.      Orders placed this encounter:  No orders of the defined types were placed in this encounter.     Derek Jack, MD [place] [phone]

## 2018-02-25 ENCOUNTER — Encounter (HOSPITAL_COMMUNITY): Payer: Self-pay | Admitting: *Deleted

## 2018-02-25 NOTE — Progress Notes (Signed)
I was notified today by pathology department that there was not enough tissue from sample to send off for Foundation One CDX or PD-L1 testing.

## 2018-03-04 ENCOUNTER — Ambulatory Visit (HOSPITAL_COMMUNITY): Payer: Medicare Other | Admitting: Hematology

## 2018-03-05 ENCOUNTER — Other Ambulatory Visit: Payer: Self-pay

## 2018-03-05 ENCOUNTER — Encounter (HOSPITAL_COMMUNITY): Payer: Self-pay | Admitting: Hematology

## 2018-03-05 ENCOUNTER — Inpatient Hospital Stay (HOSPITAL_COMMUNITY): Payer: Medicare Other | Attending: Hematology | Admitting: Hematology

## 2018-03-05 VITALS — BP 144/71 | HR 85 | Temp 97.8°F | Resp 16 | Wt 158.2 lb

## 2018-03-05 DIAGNOSIS — C3490 Malignant neoplasm of unspecified part of unspecified bronchus or lung: Secondary | ICD-10-CM | POA: Diagnosis not present

## 2018-03-05 DIAGNOSIS — E785 Hyperlipidemia, unspecified: Secondary | ICD-10-CM | POA: Diagnosis not present

## 2018-03-05 DIAGNOSIS — C3432 Malignant neoplasm of lower lobe, left bronchus or lung: Secondary | ICD-10-CM | POA: Diagnosis not present

## 2018-03-05 DIAGNOSIS — Z79899 Other long term (current) drug therapy: Secondary | ICD-10-CM | POA: Diagnosis not present

## 2018-03-05 DIAGNOSIS — E559 Vitamin D deficiency, unspecified: Secondary | ICD-10-CM | POA: Diagnosis not present

## 2018-03-05 DIAGNOSIS — I10 Essential (primary) hypertension: Secondary | ICD-10-CM | POA: Diagnosis not present

## 2018-03-05 NOTE — Assessment & Plan Note (Signed)
1.  Non-small cell lung cancer, adenocarcinoma type: -he was evaluated by me as inpatient when he was hospitalized on 12/29/2017. - Left lower lobe endobronchial biopsy on 12/15/2017 shows non-small cell lung cancer, TTF-1 positive consistent with pulmonary adenocarcinoma. - He was reluctant to consider any chemotherapy when he was inpatient. -He also developed new lesions in the lung, most likely infectious -MRI of the brain on 12/29/2017 did not show any evidence of intracranial metastasis.. -Since his discharge, he did not experience any hemoptysis.  His breathing is much better.  He is eating well and did not lose any weight. - He is walking around the house and is able to do all his activities. -We reviewed the results of the PET scan dated 02/16/2018 which shows dominant perihilar left lung mass is mildly increased in size.  There is a new hypermetabolic nodule in the left upper lobe and new hypermetabolic lesion in the dome of the liver.  Right upper lobe lung nodules have slightly decreased in size.  Similar appearance of hypermetabolic mediastinal and hilar adenopathy. - We discussed the normal prognosis of metastatic lung cancer.  He is reluctant to consider any chemotherapy.  However he is okay to consider immunotherapy and other targeted therapies. -Unfortunately they did not have enough tissue to run the foundation 1 and PDL 1 testing. -I have recommended biopsy of the liver lesion.  He is agreeable to this option.  We will send foundation 1 and PDL 1 testing and see him back in 3 weeks after the biopsy.

## 2018-03-05 NOTE — Progress Notes (Signed)
[place], [place] [address]   CLINIC:  Medical Oncology/Hematology  PCP:  Jani Gravel, MD [place] [address] [phone]   REASON FOR VISIT: Follow-up for Non-small cell lung cancer, adenocarcinoma type  [place] for PDL 1 and foundation 1 results needs a second biopsy   INTERVAL HISTORY:  Mr. Lollar 78 y.o. male returns for routine follow-up for lung cancer. He is here today with his daughter. He is still not wanting to get chemotherapy. He reports some generalized pain but nothing that is constant. He reports he fatigued at times but beside that he is doing well. Denies any nausea, vomiting, or diarrhea. Denies any new pains. Had not noticed any recent bleeding such as epistaxis, hematuria or hematochezia. Denies recent chest pain on exertion, shortness of breath on minimal exertion, pre-syncopal episodes, or palpitations. Denies any numbness or tingling in hands or feet. Denies any recent fevers, infections, or recent hospitalizations. Patient reports appetite at 100% and energy level at 50%. He is eating well and maintaining his weight well.    REVIEW OF SYSTEMS:  Review of Systems  Constitutional: Positive for fatigue.  Musculoskeletal: Positive for myalgias.  All other systems reviewed and are negative.    PAST MEDICAL/SURGICAL HISTORY:  Past Medical History:  Diagnosis Date  . Cancer ([facility])    lung  . Chronic lower back pain   . Chronic pain    pain management  . Contact with stonefish as cause of accidental injury 1954   "stung across my left hand in [place]; LUE weaker since"  . COPD (chronic obstructive pulmonary disease) ([facility])   . Coronary artery disease   . Dyspnea    with exertion  . High cholesterol   . History of blood transfusion 1943  . History of kidney stones    passed  . Hypertension   . Mass of left lung   . Migraine    "none since the 1990s" (09/11/2016)  .  Myocardial infarction ([facility]) 01/20/1993; 02/28/1993  . Neuromuscular disorder ([facility])    neuropathy left  . Pneumonia    "several times" (09/11/2016)  . Tobacco abuse    Past Surgical History:  Procedure Laterality Date  . APPENDECTOMY    . CARDIAC CATHETERIZATION    . CATARACT EXTRACTION, BILATERAL Bilateral   . COLONOSCOPY    . HIP ARTHROPLASTY Right 12/20/2016   Procedure: ARTHROPLASTY BIPOLAR HIP (HEMIARTHROPLASTY);  Surgeon: [name], MD;  Location: [facility];  Service: Orthopedics;  Laterality: Right;  . JOINT REPLACEMENT    . LEFT HEART CATH AND CORONARY ANGIOGRAPHY N/A 09/12/2016   Procedure: LEFT HEART CATH AND CORONARY ANGIOGRAPHY;  Surgeon: Burnell Blanks, MD;  Location: [facility];  Service: Cardiovascular;  Laterality: N/A;  . NASAL ENDOSCOPY WITH EPISTAXIS CONTROL Right 03/15/2016   Procedure: NASAL ENDOSCOPY WITH EPISTAXIS CONTROL;  Surgeon: Jodi Marble, MD;  Location: [facility];  Service: ENT;  Laterality: Right;  . SEPTOPLASTY Right 03/15/2016   Procedure: SEPTOPLASTY;  Surgeon: Jodi Marble, MD;  Location: [facility];  Service: ENT;  Laterality: Right;  . SINUS EXPLORATION Right 03/15/2016   Procedure: SINUS EXPLORATION;  Surgeon: Jodi Marble, MD;  Location: [facility];  Service: ENT;  Laterality: Right;  . TONSILLECTOMY    . TOOTH EXTRACTION    . TOTAL KNEE ARTHROPLASTY Left   . VIDEO BRONCHOSCOPY WITH ENDOBRONCHIAL ULTRASOUND N/A 12/15/2017   Procedure: VIDEO BRONCHOSCOPY WITH ENDOBRONCHIAL ULTRASOUND;  Surgeon:  Rigoberto Noel, MD;  Location: [facility] OR;  Service: Thoracic;  Laterality: N/A;     SOCIAL HISTORY:  Social History   Socioeconomic History  . Marital status: Widowed    Spouse name: Not on file  . Number of children: Not on file  . Years of education: Not on file  . Highest education level: Not on file  Occupational History  . Not on file  Social Needs  . Financial resource strain: Not on file  . Food insecurity:    Worry: Not on file     Inability: Not on file  . Transportation needs:    Medical: Not on file    Non-medical: Not on file  Tobacco Use  . Smoking status: Current Every Day Smoker    Packs/day: 0.40    Years: 70.00    Pack years: 28.00    Types: Cigarettes  . Smokeless tobacco: Never Used  . Tobacco comment: smokes 8 a day  Substance and Sexual Activity  . Alcohol use: No    Comment: 11/26/2017  "drank from the age of 8 til 03/05/1993"  . Drug use: Not Currently    Comment: 11/26/2017 "used some drugs when I was young"  . Sexual activity: Not Currently  Lifestyle  . Physical activity:    Days per week: Not on file    Minutes per session: Not on file  . Stress: Not on file  Relationships  . Social connections:    Talks on phone: Not on file    Gets together: Not on file    Attends religious service: Not on file    Active member of club or organization: Not on file    Attends meetings of clubs or organizations: Not on file    Relationship status: Not on file  . Intimate partner violence:    Fear of current or ex partner: Not on file    Emotionally abused: Not on file    Physically abused: Not on file    Forced sexual activity: Not on file  Other Topics Concern  . Not on file  Social History Narrative  . Not on file    FAMILY HISTORY:  Family History  Adopted: Yes  Problem Relation Age of Onset  . Cancer Mother   . Obesity Father     CURRENT MEDICATIONS:  Outpatient Encounter Medications as of 03/05/2018  Medication Sig Note  . ADVAIR HFA 115-21 MCG/ACT inhaler Inhale 2 puffs into the lungs every 12 (twelve) hours.   [name] amLODipine (NORVASC) 5 MG tablet TAKE 1 TABLET BY MOUTH EVERY DAY (Patient taking differently: Take 5 mg by mouth daily. )   . Ascorbic Acid (VITAMIN C) 500 MG CAPS Take 500 mg by mouth daily.   [name] aspirin EC 81 MG tablet Take 1 tablet by mouth daily.   . celecoxib (CELEBREX) 200 MG capsule Take 1 capsule (200 mg total) by mouth daily.   . cyclobenzaprine (FLEXERIL) 5 MG  tablet Take 5 mg by mouth 2 (two) times daily.    . Ferrous Sulfate (IRON) 325 (65 Fe) MG TABS Take 1 tablet by mouth daily.   . fluticasone (FLONASE) 50 MCG/ACT nasal spray Place 2 sprays into both nostrils daily.   [name] gabapentin (NEURONTIN) 300 MG capsule Take 1 capsule by mouth 3 (three) times daily.   [name] ipratropium-albuterol (DUONEB) 0.5-2.5 (3) MG/3ML SOLN Take 3 mLs by nebulization 3 (three) times daily.   [name] lisinopril (PRINIVIL,ZESTRIL) 20 MG tablet Take 0.5 tablets (10 mg  total) by mouth daily.   . metoCLOPramide (REGLAN) 5 MG tablet Take 1 tablet by mouth 4 (four) times daily -  before meals and at bedtime.   . metoprolol succinate (TOPROL-XL) 25 MG 24 hr tablet Take 25 mg by mouth daily. 12/29/2017: Pt stated he last took meds after dinner lastnight but could not recall the time  . mirabegron ER (MYRBETRIQ) 25 MG TB24 tablet Take 25 mg by mouth daily.   . montelukast (SINGULAIR) 10 MG tablet Take 10 mg by mouth daily.   . Multiple Vitamins-Minerals (MULTIVITAMIN WITH MINERALS) tablet Take 1 tablet by mouth daily.   . nitroGLYCERIN (NITROSTAT) 0.4 MG SL tablet Place 0.4 mg under the tongue every 5 (five) minutes as needed for chest pain.   [name] omeprazole (PRILOSEC) 40 MG capsule Take 40 mg by mouth daily.    [name] oxyCODONE (OXY IR/ROXICODONE) 5 MG immediate release tablet Take 1 tablet (5 mg total) by mouth every 4 (four) hours as needed for severe pain.   . simvastatin (ZOCOR) 10 MG tablet Take 10 mg by mouth daily.   . VENTOLIN HFA 108 (90 Base) MCG/ACT inhaler Inhale 2 puffs into the lungs every 4 (four) hours as needed for wheezing or shortness of breath.    [name] buPROPion (WELLBUTRIN SR) 150 MG 12 hr tablet Take 1 tablet (150 mg total) by mouth 2 (two) times daily. Take 1 tablet daily for 1 week, stop smoking, then take twice daily after first week.    No facility-administered encounter medications on file as of 03/05/2018.     ALLERGIES:  Allergies  Allergen Reactions  . Aspirin      Regular asa      PHYSICAL EXAM:  ECOG Performance status: 1  Vitals:   03/05/18 1000  BP: (!) 144/71  Pulse: 85  Resp: 16  Temp: 97.8 F (36.6 C)  SpO2: 93%   Filed Weights   03/05/18 1000  Weight: 158 lb 3 oz (71.8 kg)    Physical Exam Constitutional:      Appearance: Normal appearance. He is normal weight.  Musculoskeletal: Normal range of motion.  Skin:    General: Skin is warm and dry.  Neurological:     Mental Status: He is alert and oriented to person, place, and time. Mental status is at baseline.  Psychiatric:        Mood and Affect: Mood normal.        Behavior: Behavior normal.        Thought Content: Thought content normal.        Judgment: Judgment normal.      LABORATORY DATA:  I have reviewed the labs as listed.  CBC    Component Value Date/Time   WBC 16.2 (H) 01/01/2018 0535   RBC 4.25 01/01/2018 0535   HGB 12.5 (L) 01/01/2018 0535   HCT 39.1 01/01/2018 0535   PLT 397 01/01/2018 0535   MCV 92.0 01/01/2018 0535   MCH 29.4 01/01/2018 0535   MCHC 32.0 01/01/2018 0535   RDW 16.6 (H) 01/01/2018 0535   LYMPHSABS 2.0 01/01/2018 0535   MONOABS 1.3 (H) 01/01/2018 0535   EOSABS 0.5 01/01/2018 0535   BASOSABS 0.1 01/01/2018 0535   CMP Latest Ref Rng & Units 12/30/2017 12/29/2017 12/15/2017  Glucose 70 - 99 mg/dL 80 116(H) 99  BUN 8 - 23 mg/dL 17 20 15   Creatinine 0.61 - 1.24 mg/dL 0.82 1.07 0.85  Sodium 135 - 145 mmol/L 137 135 138  Potassium 3.5 - 5.1  mmol/L 3.8 3.9 3.8  Chloride 98 - 111 mmol/L 109 105 106  CO2 22 - 32 mmol/L 22 22 23   Calcium 8.9 - 10.3 mg/dL 8.1(L) 8.6(L) 8.9  Total Protein 6.5 - 8.1 g/dL - 7.4 -  Total Bilirubin 0.3 - 1.2 mg/dL - 1.2 -  Alkaline Phos 38 - 126 U/L - 109 -  AST 15 - 41 U/L - 15 -  ALT 0 - 44 U/L - 12 -       DIAGNOSTIC IMAGING:  I have independently reviewed the scans and discussed with the patient.   I have reviewed Francene Finders, NP's note and agree with the documentation.  I personally performed  a face-to-face visit, made revisions and my assessment and plan is as follows.    ASSESSMENT & PLAN:   Non-small cell lung cancer ([facility]) 1.  Non-small cell lung cancer, adenocarcinoma type: -he was evaluated by me as inpatient when he was hospitalized on 12/29/2017. - Left lower lobe endobronchial biopsy on 12/15/2017 shows non-small cell lung cancer, TTF-1 positive consistent with pulmonary adenocarcinoma. - He was reluctant to consider any chemotherapy when he was inpatient. -He also developed new lesions in the lung, most likely infectious -MRI of the brain on 12/29/2017 did not show any evidence of intracranial metastasis.. -Since his discharge, he did not experience any hemoptysis.  His breathing is much better.  He is eating well and did not lose any weight. - He is walking around the house and is able to do all his activities. -We reviewed the results of the PET scan dated 02/16/2018 which shows dominant perihilar left lung mass is mildly increased in size.  There is a new hypermetabolic nodule in the left upper lobe and new hypermetabolic lesion in the dome of the liver.  Right upper lobe lung nodules have slightly decreased in size.  Similar appearance of hypermetabolic mediastinal and hilar adenopathy. - We discussed the normal prognosis of metastatic lung cancer.  He is reluctant to consider any chemotherapy.  However he is okay to consider immunotherapy and other targeted therapies. -Unfortunately they did not have enough tissue to run the foundation 1 and PDL 1 testing. -I have recommended biopsy of the liver lesion.  He is agreeable to this option.  We will send foundation 1 and PDL 1 testing and see him back in 3 weeks after the biopsy.       Orders placed this encounter:  Orders Placed This Encounter  Procedures  . US BIOPSY (LIVER)      Derek Jack, MD [place] [phone]

## 2018-03-05 NOTE — Patient Instructions (Signed)
[place] at [facility] Discharge Instructions  Follow up 3 weeks after your biopsy.    Thank you for choosing [place] at [facility] to provide your oncology and hematology care.  To afford each patient quality time with our provider, please arrive at least 15 minutes before your scheduled appointment time.   If you have a lab appointment with the [facility] please come in thru the  Main Entrance and check in at the main information desk  You need to re-schedule your appointment should you arrive 10 or more minutes late.  We strive to give you quality time with our providers, and arriving late affects you and other patients whose appointments are after yours.  Also, if you no show three or more times for appointments you may be dismissed from the clinic at the providers discretion.     Again, thank you for choosing [facility].  Our hope is that these requests will decrease the amount of time that you wait before being seen by our physicians.       _____________________________________________________________  Should you have questions after your visit to [facility], please contact our office at [phone] between the hours of 8:00 a.m. and 4:30 p.m.  Voicemails left after 4:00 p.m. will not be returned until the following business day.  For prescription refill requests, have your pharmacy contact our office and allow 72 hours.    Cancer Center Support Programs:   > Cancer Support Group  2nd Tuesday of the month 1pm-2pm, Journey Room

## 2018-03-09 DIAGNOSIS — M545 Low back pain: Secondary | ICD-10-CM | POA: Diagnosis not present

## 2018-03-09 DIAGNOSIS — I10 Essential (primary) hypertension: Secondary | ICD-10-CM | POA: Diagnosis not present

## 2018-03-09 DIAGNOSIS — Z0001 Encounter for general adult medical examination with abnormal findings: Secondary | ICD-10-CM | POA: Diagnosis not present

## 2018-03-09 DIAGNOSIS — Z79899 Other long term (current) drug therapy: Secondary | ICD-10-CM | POA: Diagnosis not present

## 2018-03-09 DIAGNOSIS — M25562 Pain in left knee: Secondary | ICD-10-CM | POA: Diagnosis not present

## 2018-03-09 DIAGNOSIS — I2510 Atherosclerotic heart disease of native coronary artery without angina pectoris: Secondary | ICD-10-CM | POA: Diagnosis not present

## 2018-03-09 DIAGNOSIS — J449 Chronic obstructive pulmonary disease, unspecified: Secondary | ICD-10-CM | POA: Diagnosis not present

## 2018-03-16 ENCOUNTER — Other Ambulatory Visit: Payer: Self-pay | Admitting: Physician Assistant

## 2018-03-16 ENCOUNTER — Other Ambulatory Visit: Payer: Self-pay | Admitting: Radiology

## 2018-03-17 ENCOUNTER — Ambulatory Visit (HOSPITAL_COMMUNITY)
Admission: RE | Admit: 2018-03-17 | Discharge: 2018-03-17 | Disposition: A | Discharge status: Home / Self Care | Payer: Medicare Other | Source: Ambulatory Visit | Attending: Nurse Practitioner | Admitting: Nurse Practitioner

## 2018-03-17 ENCOUNTER — Other Ambulatory Visit: Payer: Self-pay

## 2018-03-17 DIAGNOSIS — I252 Old myocardial infarction: Secondary | ICD-10-CM | POA: Insufficient documentation

## 2018-03-17 DIAGNOSIS — F17210 Nicotine dependence, cigarettes, uncomplicated: Secondary | ICD-10-CM | POA: Diagnosis not present

## 2018-03-17 DIAGNOSIS — C229 Malignant neoplasm of liver, not specified as primary or secondary: Secondary | ICD-10-CM | POA: Diagnosis not present

## 2018-03-17 DIAGNOSIS — I10 Essential (primary) hypertension: Secondary | ICD-10-CM | POA: Diagnosis not present

## 2018-03-17 DIAGNOSIS — K7689 Other specified diseases of liver: Secondary | ICD-10-CM | POA: Diagnosis not present

## 2018-03-17 DIAGNOSIS — I2510 Atherosclerotic heart disease of native coronary artery without angina pectoris: Secondary | ICD-10-CM | POA: Insufficient documentation

## 2018-03-17 DIAGNOSIS — Z79899 Other long term (current) drug therapy: Secondary | ICD-10-CM | POA: Diagnosis not present

## 2018-03-17 DIAGNOSIS — Z87442 Personal history of urinary calculi: Secondary | ICD-10-CM | POA: Insufficient documentation

## 2018-03-17 DIAGNOSIS — Z7982 Long term (current) use of aspirin: Secondary | ICD-10-CM | POA: Insufficient documentation

## 2018-03-17 DIAGNOSIS — E7800 Pure hypercholesterolemia, unspecified: Secondary | ICD-10-CM | POA: Diagnosis not present

## 2018-03-17 DIAGNOSIS — Z809 Family history of malignant neoplasm, unspecified: Secondary | ICD-10-CM | POA: Insufficient documentation

## 2018-03-17 DIAGNOSIS — G709 Myoneural disorder, unspecified: Secondary | ICD-10-CM | POA: Insufficient documentation

## 2018-03-17 DIAGNOSIS — C3490 Malignant neoplasm of unspecified part of unspecified bronchus or lung: Secondary | ICD-10-CM | POA: Diagnosis not present

## 2018-03-17 DIAGNOSIS — J449 Chronic obstructive pulmonary disease, unspecified: Secondary | ICD-10-CM | POA: Diagnosis not present

## 2018-03-17 DIAGNOSIS — C227 Other specified carcinomas of liver: Secondary | ICD-10-CM | POA: Diagnosis not present

## 2018-03-17 DIAGNOSIS — R160 Hepatomegaly, not elsewhere classified: Secondary | ICD-10-CM | POA: Diagnosis not present

## 2018-03-17 LAB — CBC
HCT: 39.9 % (ref 39.0–52.0)
HEMOGLOBIN: 12.5 g/dL — AB (ref 13.0–17.0)
MCH: 30 pg (ref 26.0–34.0)
MCHC: 31.3 g/dL (ref 30.0–36.0)
MCV: 95.7 fL (ref 80.0–100.0)
Platelets: 518 10*3/uL — ABNORMAL HIGH (ref 150–400)
RBC: 4.17 MIL/uL — ABNORMAL LOW (ref 4.22–5.81)
RDW: 16.6 % — ABNORMAL HIGH (ref 11.5–15.5)
WBC: 18.9 10*3/uL — ABNORMAL HIGH (ref 4.0–10.5)
nRBC: 0 % (ref 0.0–0.2)

## 2018-03-17 LAB — PROTIME-INR
INR: 1.16
PROTHROMBIN TIME: 14.7 s (ref 11.4–15.2)

## 2018-03-17 MED ORDER — MIDAZOLAM HCL 2 MG/2ML IJ SOLN
INTRAMUSCULAR | Status: AC
  Filled 2018-03-17: qty 2

## 2018-03-17 MED ORDER — MIDAZOLAM HCL 2 MG/2ML IJ SOLN
INTRAMUSCULAR | Status: AC | PRN
  Administered 2018-03-17: 0.5 mg via INTRAVENOUS
  Administered 2018-03-17: 1 mg via INTRAVENOUS

## 2018-03-17 MED ORDER — FENTANYL CITRATE (PF) 100 MCG/2ML IJ SOLN
INTRAMUSCULAR | Status: AC | PRN
  Administered 2018-03-17: 50 ug via INTRAVENOUS

## 2018-03-17 MED ORDER — SODIUM CHLORIDE 0.9 % IV SOLN: INTRAVENOUS | Status: DC

## 2018-03-17 MED ORDER — FENTANYL CITRATE (PF) 100 MCG/2ML IJ SOLN
INTRAMUSCULAR | Status: AC
  Filled 2018-03-17: qty 2

## 2018-03-17 MED ORDER — LIDOCAINE HCL (PF) 1 % IJ SOLN
INTRAMUSCULAR | Status: AC
  Filled 2018-03-17: qty 30

## 2018-03-17 MED ORDER — GELATIN ABSORBABLE 12-7 MM EX MISC
CUTANEOUS | Status: AC
  Filled 2018-03-17: qty 1

## 2018-03-17 NOTE — Discharge Instructions (Addendum)
Liver Biopsy, Care After °These instructions give you information about how to care for yourself after your procedure. Your health care provider may also give you more specific instructions. If you have problems or questions, contact your health care provider. °What can I expect after the procedure? °After your procedure, it is common to have: °· Pain and soreness in the area where the biopsy was done. °· Bruising around the area where the biopsy was done. °· Sleepiness and fatigue for 1-2 days. °Follow these instructions at home: °Medicines °· Take over-the-counter and prescription medicines only as told by your health care provider. °· If you were prescribed an antibiotic medicine, take it as told by your health care provider. Do not stop taking the antibiotic even if you start to feel better. °· Do not take medicines such as aspirin and ibuprofen unless your health care provider tells you to take them. These medicines thin your blood and can increase the risk of bleeding. °· If you are taking prescription pain medicine, take actions to prevent or treat constipation. Your health care provider may recommend that you: °? Drink enough fluid to keep your urine pale yellow. °? Eat foods that are high in fiber, such as fresh fruits and vegetables, whole grains, and beans. °? Limit foods that are high in fat and processed sugars, such as fried or sweet foods. °? Take an over-the-counter or prescription medicine for constipation. °Incision care °· Follow instructions from your health care provider about how to take care of your incision. Make sure you: °? Wash your hands with soap and water before you change your bandage (dressing). If soap and water are not available, use hand sanitizer. °? Change your dressing as told by your health care provider. °? Leave stitches (sutures), skin glue, or adhesive strips in place. These skin closures may need to stay in place for 2 weeks or longer. If adhesive strip edges start to  loosen and curl up, you may trim the loose edges. Do not remove adhesive strips completely unless your health care provider tells you to do that. °· Check your incision area every day for signs of infection. Check for: °? Redness, swelling, or pain. °? Fluid or blood. °? Warmth. °? Pus or a bad smell. °· Do not take baths, swim, or use a hot tub until your health care provider says it is okay to do so. °Activity ° °· Rest at home for 1-2 days, or as directed by your health care provider. °? Avoid sitting for a long time without moving. Get up to take short walks every 1-2 hours. This is important to improve blood flow and breathing. Ask for help if you feel weak or unsteady. °· Return to your normal activities as told by your health care provider. Ask your health care provider what activities are safe for you. °· Do not drive or use heavy machinery while taking prescription pain medicine. °· Do not lift anything that is heavier than 10 lb (4.5 kg), or the limit that your health care provider tells you, until he or she says that it is safe. °· Do not play contact sports for 2 weeks after the procedure. °General instructions ° °· Do not drink alcohol in the first week after the procedure. °· Have someone stay with you for at least 24 hours after the procedure. °· It is your responsibility to obtain your test results. Ask your health care provider, or the department that is doing the test: °? When will my   results be ready? °? How will I get my results? °? What are my treatment options? °? What other tests do I need? °? What are my next steps? °· Keep all follow-up visits as told by your health care provider. This is important. °Contact a health care provider if: °· You have increased bleeding from an incision, resulting in more than a small spot of blood. °· You have redness, swelling, or increasing pain in any incisions. °· You notice a discharge or a bad smell coming from any of your incisions. °· You have a fever or  chills. °Get help right away if: °· You develop swelling, bloating, or pain in your abdomen. °· You become dizzy or faint. °· You develop a rash. °· You have nausea or you vomit. °· You faint, or you have shortness of breath or difficulty breathing. °· You develop chest pain. °· You have problems with your speech or vision. °· You have trouble with your balance or moving your arms or legs. °Summary °· After the liver biopsy, it is common to have pain, soreness, and bruising in the area, as well as sleepiness and fatigue. °· Take over-the-counter and prescription medicines only as told by your health care provider. °· Follow instructions from your health care provider about how to care for your incision. Check the incision area daily for signs of infection. °This information is not intended to replace advice given to you by your health care provider. Make sure you discuss any questions you have with your health care provider. °Document Released: 08/02/2004 Document Revised: 01/23/2017 Document Reviewed: 01/23/2017 °Elsevier Interactive Patient Education © 2019 [employer] °Moderate Conscious Sedation, Adult, Care After °These instructions provide you with information about caring for yourself after your procedure. Your health care provider may also give you more specific instructions. Your treatment has been planned according to current medical practices, but problems sometimes occur. Call your health care provider if you have any problems or questions after your procedure. °What can I expect after the procedure? °After your procedure, it is common: °· To feel sleepy for several hours. °· To feel clumsy and have poor balance for several hours. °· To have poor judgment for several hours. °· To vomit if you eat too soon. °Follow these instructions at home: °For at least 24 hours after the procedure: ° °· Do not: °? Participate in activities where you could fall or become injured. °? Drive. °? Use heavy  machinery. °? Drink alcohol. °? Take sleeping pills or medicines that cause drowsiness. °? Make important decisions or sign legal documents. °? Take care of children on your own. °· Rest. °Eating and drinking °· Follow the diet recommended by your health care provider. °· If you vomit: °? Drink water, juice, or soup when you can drink without vomiting. °? Make sure you have little or no nausea before eating solid foods. °General instructions °· Have a responsible adult stay with you until you are awake and alert. °· Take over-the-counter and prescription medicines only as told by your health care provider. °· If you smoke, do not smoke without supervision. °· Keep all follow-up visits as told by your health care provider. This is important. °Contact a health care provider if: °· You keep feeling nauseous or you keep vomiting. °· You feel light-headed. °· You develop a rash. °· You have a fever. °Get help right away if: °· You have trouble breathing. °This information is not intended to replace advice given to you by your   health care provider. Make sure you discuss any questions you have with your health care provider. °Document Released: 11/03/2012 Document Revised: 06/18/2015 Document Reviewed: 05/05/2015 °Elsevier Interactive Patient Education © 2019 [employer] ° °

## 2018-03-17 NOTE — H&P (Signed)
Chief Complaint: Patient was seen in consultation today for liver lesion biopsy.  Referring Physician(s): Glennie Isle  Supervising Physician: Aletta Edouard  Patient Status: [facility] - Out-pt  History of Present Illness: Bradley [name] is a 78 y.o. male with a past medical history significant for HLD, HTN, CAD, history of MI x 2, COPD, chronic back pain, left lower extremity neuropathy, left upper extremity weakness and recently diagnosed non-small cell lung cancer followed by Dr. Delton Coombes who presents today for a biopsy of a hypermetabolic liver lesion seen on PET from 02/16/18 to further direct treatment options.   Patient reports he has had chronic back pain since the 1960s, was stung by a stonefish in [place] in 1950s and has had residual left upper extremity weakness since then and has had two heart attacks in 1995 and 1994. He reports that he has had trouble breathing and intermittent chest pain for about 15 years and that he knew many people who died of lung cancer so he was too afraid to have it investigated. He admits to continued tobacco use with no current plans to quit. He states that he is strongly against chemotherapy as he has seen many of his friends quickly pass away after beginning chemotherapy, however he is not opposed to other treatment options and that is why is undergoing the liver biopsy today.   Past Medical History:  Diagnosis Date  . Cancer ([place])    lung  . Chronic lower back pain   . Chronic pain    pain management  . Contact with stonefish as cause of accidental injury 1954   "stung across my left hand in [place]; LUE weaker since"  . COPD (chronic obstructive pulmonary disease) ([place])   . Coronary artery disease   . Dyspnea    with exertion  . High cholesterol   . History of blood transfusion 1943  . History of kidney stones    passed  . Hypertension   . Mass of left lung   . Migraine    "none since the 1990s" (09/11/2016)  . Myocardial infarction ([place])  01/20/1993; 02/28/1993  . Neuromuscular disorder ([facility])    neuropathy left  . Pneumonia    "several times" (09/11/2016)  . Tobacco abuse     Past Surgical History:  Procedure Laterality Date  . APPENDECTOMY    . CARDIAC CATHETERIZATION    . CATARACT EXTRACTION, BILATERAL Bilateral   . COLONOSCOPY    . HIP ARTHROPLASTY Right 12/20/2016   Procedure: ARTHROPLASTY BIPOLAR HIP (HEMIARTHROPLASTY);  Surgeon: [name], MD;  Location: [place];  Service: Orthopedics;  Laterality: Right;  . JOINT REPLACEMENT    . LEFT HEART CATH AND CORONARY ANGIOGRAPHY N/A 09/12/2016   Procedure: LEFT HEART CATH AND CORONARY ANGIOGRAPHY;  Surgeon: Burnell Blanks, MD;  Location: [place] [facility];  Service: Cardiovascular;  Laterality: N/A;  . NASAL ENDOSCOPY WITH EPISTAXIS CONTROL Right 03/15/2016   Procedure: NASAL ENDOSCOPY WITH EPISTAXIS CONTROL;  Surgeon: Jodi Marble, MD;  Location: [place];  Service: ENT;  Laterality: Right;  . SEPTOPLASTY Right 03/15/2016   Procedure: SEPTOPLASTY;  Surgeon: Jodi Marble, MD;  Location: [place];  Service: ENT;  Laterality: Right;  . SINUS EXPLORATION Right 03/15/2016   Procedure: SINUS EXPLORATION;  Surgeon: Jodi Marble, MD;  Location: [place];  Service: ENT;  Laterality: Right;  . TONSILLECTOMY    . TOOTH EXTRACTION    . TOTAL KNEE ARTHROPLASTY Left   . VIDEO BRONCHOSCOPY WITH ENDOBRONCHIAL ULTRASOUND N/A 12/15/2017  Procedure: VIDEO BRONCHOSCOPY WITH ENDOBRONCHIAL ULTRASOUND;  Surgeon: Rigoberto Noel, MD;  Location: [facility];  Service: Thoracic;  Laterality: N/A;    Allergies: Aspirin  Medications: Prior to Admission medications   Medication Sig Start Date End Date Taking? Authorizing Provider  ADVAIR HFA 115-21 MCG/ACT inhaler Inhale 2 puffs into the lungs every 12 (twelve) hours. 01/29/16  Yes [provider]  amLODipine (NORVASC) 5 MG tablet TAKE 1 TABLET BY MOUTH EVERY DAY Patient taking differently: Take 5 mg by mouth daily.  08/24/17  Yes Lendon Colonel, NP  Ascorbic Acid (VITAMIN C) 500 MG CAPS Take 500 mg by mouth daily.   Yes [provider]  aspirin EC 81 MG tablet Take 81 mg by mouth daily.    Yes [provider]  celecoxib (CELEBREX) 200 MG capsule Take 1 capsule (200 mg total) by mouth daily. 12/22/16  Yes [name], MD  cyclobenzaprine (FLEXERIL) 5 MG tablet Take 5 mg by mouth 2 (two) times daily.    Yes [provider]  Ferrous Sulfate (IRON) 325 (65 Fe) MG TABS Take 1 tablet by mouth daily. 12/10/15  Yes [provider]  fluticasone (FLONASE) 50 MCG/ACT nasal spray Place 2 sprays into both nostrils daily. 12/11/17  Yes [provider]  gabapentin (NEURONTIN) 300 MG capsule Take 1 capsule by mouth 3 (three) times daily. 08/15/16  Yes [provider]  ipratropium-albuterol (DUONEB) 0.5-2.5 (3) MG/3ML SOLN Take 3 mLs by nebulization 3 (three) times daily. 01/01/18  Yes Johnson, Clanford L, MD  lisinopril (PRINIVIL,ZESTRIL) 20 MG tablet Take 0.5 tablets (10 mg total) by mouth daily. Patient taking differently: Take 20 mg by mouth daily.  01/01/18  Yes Johnson, Clanford L, MD  metoCLOPramide (REGLAN) 5 MG tablet Take 1 tablet by mouth 4 (four) times daily -  before meals and at bedtime. 01/25/16  Yes [provider]  metoprolol succinate (TOPROL-XL) 25 MG 24 hr tablet Take 25 mg by mouth daily.   Yes [provider]  mirabegron ER (MYRBETRIQ) 25 MG TB24 tablet Take 25 mg by mouth at bedtime.    Yes [provider]  Multiple Vitamins-Minerals (MULTIVITAMIN WITH MINERALS) tablet Take 1 tablet by mouth daily.   Yes [provider]  nitroGLYCERIN (NITROSTAT) 0.4 MG SL tablet Place 0.4 mg under the tongue every 5 (five) minutes as needed for chest pain.   Yes [provider]  omeprazole (PRILOSEC) 40 MG capsule Take 40 mg by mouth daily.    Yes [provider]  oxyCODONE (OXY IR/ROXICODONE) 5 MG immediate release tablet Take 1  tablet (5 mg total) by mouth every 4 (four) hours as needed for severe pain. 01/01/18  Yes Johnson, Clanford L, MD  potassium gluconate (HM POTASSIUM) 595 (99 K) MG TABS tablet Take 595 mg by mouth daily.   Yes [provider]  simvastatin (ZOCOR) 10 MG tablet Take 10 mg by mouth at bedtime.  12/23/15  Yes [provider]  VENTOLIN HFA 108 (90 Base) MCG/ACT inhaler Inhale 2 puffs into the lungs every 4 (four) hours as needed for wheezing or shortness of breath.  12/11/15  Yes [provider]  buPROPion (WELLBUTRIN SR) 150 MG 12 hr tablet Take 1 tablet (150 mg total) by mouth 2 (two) times daily. Take 1 tablet daily for 1 week, stop smoking, then take twice daily after first week. Patient not taking: Reported on 03/17/2018 12/09/17 02/18/18  Fenton Foy, NP     Family History  Adopted: Yes  Problem Relation Age of Onset  . Cancer Mother   . Obesity Father     Social History   Socioeconomic History  . Marital status: Widowed    Spouse name: Not on file  . Number of children: Not on file  . Years of education: Not on file  . Highest education level: Not on file  Occupational History  . Not on file  Social Needs  . Financial resource strain: Not on file  . Food insecurity:    Worry: Not on file    Inability: Not on file  . Transportation needs:    Medical: Not on file    Non-medical: Not on file  Tobacco Use  . Smoking status: Current Every Day Smoker    Packs/day: 0.40    Years: 70.00    Pack years: 28.00    Types: Cigarettes  . Smokeless tobacco: Never Used  . Tobacco comment: smokes 8 a day  Substance and Sexual Activity  . Alcohol use: No    Comment: 11/26/2017  "drank from the age of 8 til 03/05/1993"  . Drug use: Not Currently    Comment: 11/26/2017 "used some drugs when I was young"  . Sexual activity: Not Currently  Lifestyle  . Physical activity:    Days per week: Not on file    Minutes per session: Not on file  . Stress: Not on file    Relationships  . Social connections:    Talks on phone: Not on file    Gets together: Not on file    Attends religious service: Not on file    Active member of club or organization: Not on file    Attends meetings of clubs or organizations: Not on file    Relationship status: Not on file  Other Topics Concern  . Not on file  Social History Narrative  . Not on file     Review of Systems: A 12 point ROS discussed and pertinent positives are indicated in the HPI above.  All other systems are negative.  Review of Systems  Constitutional: Negative for chills, fatigue and fever.  Respiratory: Positive for cough (intermittent; sometimes productive of white sputum).   Cardiovascular: Positive for chest pain (intermittent; substernal - present for many years. None during exam. ).  Gastrointestinal: Positive for diarrhea (intermittent; none today. ). Negative for abdominal pain, nausea and vomiting.  Musculoskeletal: Positive for back pain (Chronic).  Neurological: Positive for weakness (left upper extremity - chronic) and numbness (left lower extremity - chronic). Negative for headaches.  Psychiatric/Behavioral: Negative for confusion.    Vital Signs: BP 135/73   Pulse 78   Temp 97.9 F (36.6 C) (Oral)   Ht 6' (1.829 m)   Wt 150 lb (68 kg)   SpO2 97%   BMI 20.34 kg/m   Physical Exam Vitals signs reviewed.  Constitutional:      General: He is not in acute distress.    Comments: Frail appearing. Pleasant, talkative.  HENT:     Head: Normocephalic.  Cardiovascular:     Rate and Rhythm: Normal rate and regular rhythm.  Pulmonary:     Effort: Pulmonary effort is normal.     Breath sounds: Normal breath sounds.     Comments: Some pauses while talking with pursed lip breathing Abdominal:     General: There is no distension.     Palpations: Abdomen is soft.     Tenderness: There is no abdominal tenderness.  Skin:  General: Skin is warm and dry.  Neurological:     Mental  Status: He is alert and oriented to person, place, and time.  Psychiatric:        Mood and Affect: Mood normal.        Behavior: Behavior normal.        Thought Content: Thought content normal.        Judgment: Judgment normal.      MD Evaluation Airway: WNL(Edentulous) Heart: WNL Abdomen: WNL Chest/ Lungs: WNL ASA  Classification: 3 Mallampati/Airway Score: One   Imaging: Nm Pet Image Restag (ps) Skull Base To Thigh  Result Date: 02/16/2018 CLINICAL DATA:  Initial treatment strategy for non-small cell lung cancer. EXAM: NUCLEAR MEDICINE PET SKULL BASE TO THIGH TECHNIQUE: 9.87 mCi F-18 FDG was injected intravenously. Full-ring PET imaging was performed from the skull base to thigh after the radiotracer. CT data was obtained and used for attenuation correction and anatomic localization. Fasting blood glucose: 89 mg/dl COMPARISON:  None. FINDINGS: Mediastinal blood pool activity: SUV max 2.3 NECK: No hypermetabolic lymph nodes in the neck. Incidental CT findings: none CHEST: No hypermetabolic supraclavicular or axillary lymph nodes. Hypermetabolic mediastinal and hilar lymph nodes are again noted. -The index right paratracheal lymph node measures 1.3 cm within SUV max of 4.6. Previously 1.2 cm within SUV max of 5.6. -The SUV max associated with the right hilum is equal to 5.15. Previously 4.69. The central left lower lobe perihilar lung mass measures 4.0 by 2.4 cm and has an SUV max of 17.42. On the previous exam 3.6 by 1.9 cm. Scattered nodules in both lungs are again identified: -new spiculated nodule in the left upper lobe measuring 8 mm with SUV max of 2.15. -the index nodule within the periphery of the right upper lobe measures 0.8 cm with SUV max of 3.67. Previously 1.6 cm within SUV max of 7.02. -Adjacent lung nodule measures 6 mm within SUV max of 1.45, image 92/3. Previously 1.2 cm within SUV max of 4.15. The index sub solid nodule within the central right upper lobe measures 1.1 cm with  SUV max of 1.02. Previously 1.8 cm with SUV max of 3.4. Bandlike area of architectural distortion and fibrosis within the left upper lobe appears new from previous exam and is favored to represent changes secondary to external beam radiation. Incidental CT findings: Advanced changes of emphysema. Aortic atherosclerosis. ABDOMEN/PELVIS: Within the central aspect of the liver near the dome there is a new lesion measuring 3.3 cm within SUV max of 22.48. Suspicious for metastatic disease. No abnormal uptake within the pancreas or spleen. The adrenal glands are unremarkable. No hypermetabolic lymph nodes within the soft tissues of the neck. Incidental CT findings: Aortic atherosclerosis. Gallstones noted. Nonobstructing right renal calculi identified. Previous right hip arthroplasty. SKELETON: No focal hypermetabolic activity to suggest skeletal metastasis. Incidental CT findings: none IMPRESSION: 1. Mixed response to therapy. The dominant, perihilar lung mass in the left lower lobe is mildly increased in size in the interval. Additionally, there is a new hypermetabolic nodule in the left upper lobe and a new hypermetabolic lesion within the dome of liver. 2. Right upper lobe lung nodules are decreased in size and degree of FDG uptake compared with previous exam. 3. Similar appearance of hypermetabolic mediastinal and hilar lymph nodes. Electronically Signed   By: Kerby Moors M.D.   On: 02/16/2018 14:28    Labs:  CBC: Recent Labs    12/30/17 0505 12/31/17 0458 01/01/18 0535 03/17/18 1125  WBC  24.9* 20.9* 16.2* 18.9*  HGB 12.8* 12.4* 12.5* 12.5*  HCT 40.2 38.3* 39.1 39.9  PLT 361 386 397 518*    COAGS: Recent Labs    12/29/17 0904 03/17/18 1125  INR 1.07 1.16    BMP: Recent Labs    11/27/17 0651 12/15/17 0912 12/29/17 0752 12/30/17 0505  NA 135 138 135 137  K 3.7 3.8 3.9 3.8  CL 101 106 105 109  CO2 22 23 22 22   GLUCOSE 107* 99 116* 80  BUN 15 15 20 17   CALCIUM 9.3 8.9 8.6* 8.1*    CREATININE 0.79 0.85 1.07 0.82  GFRNONAA >60 >60 >60 >60  GFRAA >60 >60 >60 >60    LIVER FUNCTION TESTS: Recent Labs    12/29/17 0752  BILITOT 1.2  AST 15  ALT 12  ALKPHOS 109  PROT 7.4  ALBUMIN 3.4*    TUMOR MARKERS: No results for input(s): AFPTM, CEA, CA199, CHROMGRNA in the last 8760 hours.  Assessment and Plan:  78 y/o M with recently diagnosed non-small cell lung cancer followed by Dr. Delton Coombes who presents today for a biopsy of a hypermetabolic liver lesion seen on PET from 02/16/18 to further direct treatment options.   Patient has been NPO since 5:30 am today, he does not take blood thinning medications. Afebrile, WBC 18.9 which is near baseline per chart, hgb 12.5, plt 518, INR 1.16.  Risks and benefits discussed with the patient including, but not limited to bleeding, infection, damage to adjacent structures or low yield requiring additional tests.  All of the patient's questions were answered, patient is agreeable to proceed.  Consent signed and in chart.  Thank you for this interesting consult.  I greatly enjoyed meeting Bradley [name] and look forward to participating in their care.  A copy of this report was sent to the requesting provider on this date.  Electronically Signed: Joaquim Nam, PA-C 03/17/2018, 12:57 PM   I spent a total of  30 Minutes  in face to face in clinical consultation, greater than 50% of which was counseling/coordinating care for liver lesion biopsy.

## 2018-03-17 NOTE — Procedures (Signed)
Interventional Radiology Procedure Note  Procedure: US Guided Biopsy of right hepatic liver mass  Complications: None  Estimated Blood Loss: < 10 mL  Findings: 18 G core biopsy of right lobe liver mass performed under US guidance.  Three core samples obtained and sent to Pathology.  Venetia Night. Kathlene Cote, M.D Pager:  [phone]

## 2018-03-18 DIAGNOSIS — J189 Pneumonia, unspecified organism: Secondary | ICD-10-CM | POA: Diagnosis not present

## 2018-03-31 ENCOUNTER — Encounter (HOSPITAL_COMMUNITY): Payer: Self-pay | Admitting: *Deleted

## 2018-03-31 NOTE — Progress Notes (Signed)
I checked [facility] and they have not received the specimen. I called and spoke with Amber in pathology at [facility] and verified that specimen had in fact not been sent out. She states that she will send it expedited today.  Accession # [id number]. 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

## 2018-04-02 ENCOUNTER — Ambulatory Visit (HOSPITAL_COMMUNITY): Payer: Medicare Other | Admitting: Hematology

## 2018-04-06 DIAGNOSIS — M545 Low back pain: Secondary | ICD-10-CM | POA: Diagnosis not present

## 2018-04-06 DIAGNOSIS — Z79891 Long term (current) use of opiate analgesic: Secondary | ICD-10-CM | POA: Diagnosis not present

## 2018-04-06 DIAGNOSIS — C3490 Malignant neoplasm of unspecified part of unspecified bronchus or lung: Secondary | ICD-10-CM | POA: Diagnosis not present

## 2018-04-06 DIAGNOSIS — R05 Cough: Secondary | ICD-10-CM | POA: Diagnosis not present

## 2018-04-06 DIAGNOSIS — I10 Essential (primary) hypertension: Secondary | ICD-10-CM | POA: Diagnosis not present

## 2018-04-06 DIAGNOSIS — J449 Chronic obstructive pulmonary disease, unspecified: Secondary | ICD-10-CM | POA: Diagnosis not present

## 2018-04-09 ENCOUNTER — Ambulatory Visit (HOSPITAL_COMMUNITY): Payer: Medicare Other | Admitting: Hematology

## 2018-04-13 ENCOUNTER — Encounter: Payer: Self-pay | Admitting: Hematology

## 2018-04-13 DIAGNOSIS — C3490 Malignant neoplasm of unspecified part of unspecified bronchus or lung: Secondary | ICD-10-CM | POA: Diagnosis not present

## 2018-04-14 ENCOUNTER — Encounter (HOSPITAL_COMMUNITY): Payer: Self-pay | Admitting: *Deleted

## 2018-04-14 NOTE — Progress Notes (Signed)
I spoke with Keisha in pathology and they received notification that the quantity was insufficient for PDL1 testing.  They did receive [facility] and it has been scanned into patient's chart.  

## 2018-04-15 ENCOUNTER — Other Ambulatory Visit: Payer: Self-pay

## 2018-04-15 ENCOUNTER — Inpatient Hospital Stay (HOSPITAL_COMMUNITY): Payer: Medicare Other | Attending: Hematology | Admitting: Hematology

## 2018-04-15 ENCOUNTER — Encounter (HOSPITAL_COMMUNITY): Payer: Self-pay | Admitting: Hematology

## 2018-04-15 DIAGNOSIS — Z5112 Encounter for antineoplastic immunotherapy: Secondary | ICD-10-CM | POA: Diagnosis not present

## 2018-04-15 DIAGNOSIS — Z79899 Other long term (current) drug therapy: Secondary | ICD-10-CM | POA: Diagnosis not present

## 2018-04-15 DIAGNOSIS — C3492 Malignant neoplasm of unspecified part of left bronchus or lung: Secondary | ICD-10-CM

## 2018-04-15 DIAGNOSIS — C3432 Malignant neoplasm of lower lobe, left bronchus or lung: Secondary | ICD-10-CM | POA: Diagnosis not present

## 2018-04-15 NOTE — Progress Notes (Signed)
Bradley [name], Bradley [name] [id number]   CLINIC:  Medical Oncology/Hematology  PCP:  Bradley Gravel, MD [place] [id number] [phone]   REASON FOR VISIT:  Follow-up for Non-small cell lung cancer, adenocarcinoma type  CURRENT THERAPY:[facility].     INTERVAL HISTORY:  Bradley [name] 78 y.o. male returns for routine follow-up. He is here today with his daughter. He states that he has been doing well since the last biopsy. Denies any nausea, vomiting, or diarrhea. Denies any new pains. Had not noticed any recent bleeding such as epistaxis, hematuria or hematochezia. Denies recent chest pain on exertion,  pre-syncopal episodes, or palpitations. Denies any numbness or tingling in hands or feet. Denies any recent fevers, infections, or recent hospitalizations. Patient reports appetite at 100% and energy level at 50%.     REVIEW OF SYSTEMS:  Review of Systems  Respiratory: Positive for shortness of breath.      PAST MEDICAL/SURGICAL HISTORY:  Past Medical History:  Diagnosis Date   Cancer ([place])    lung   Chronic lower back pain    Chronic pain    pain management   Contact with stonefish as cause of accidental injury 1954   "stung across my left hand in [place]; LUE weaker since"   COPD (chronic obstructive pulmonary disease) ([place])    Coronary artery disease    Dyspnea    with exertion   High cholesterol    History of blood transfusion 1943   History of kidney stones    passed   Hypertension    Mass of left lung    Migraine    "none since the 1990s" (09/11/2016)   Myocardial infarction ([place]) 01/20/1993; 02/28/1993   Neuromuscular disorder ([place])    neuropathy left   Pneumonia    "several times" (09/11/2016)   Tobacco abuse    Past Surgical History:  Procedure Laterality Date   APPENDECTOMY     CARDIAC CATHETERIZATION     CATARACT EXTRACTION, BILATERAL Bilateral    COLONOSCOPY      HIP ARTHROPLASTY Right 12/20/2016   Procedure: ARTHROPLASTY BIPOLAR HIP (HEMIARTHROPLASTY);  Surgeon: [name], MD;  Location: [facility];  Service: Orthopedics;  Laterality: Right;   JOINT REPLACEMENT     LEFT HEART CATH AND CORONARY ANGIOGRAPHY N/A 09/12/2016   Procedure: LEFT HEART CATH AND CORONARY ANGIOGRAPHY;  Surgeon: Burnell Blanks, MD;  Location: [facility];  Service: Cardiovascular;  Laterality: N/A;   NASAL ENDOSCOPY WITH EPISTAXIS CONTROL Right 03/15/2016   Procedure: NASAL ENDOSCOPY WITH EPISTAXIS CONTROL;  Surgeon: Jodi Marble, MD;  Location: [facility];  Service: ENT;  Laterality: Right;   SEPTOPLASTY Right 03/15/2016   Procedure: SEPTOPLASTY;  Surgeon: Jodi Marble, MD;  Location: [facility];  Service: ENT;  Laterality: Right;   SINUS EXPLORATION Right 03/15/2016   Procedure: SINUS EXPLORATION;  Surgeon: Jodi Marble, MD;  Location: [facility];  Service: ENT;  Laterality: Right;   TONSILLECTOMY     TOOTH EXTRACTION     TOTAL KNEE ARTHROPLASTY Left    VIDEO BRONCHOSCOPY WITH ENDOBRONCHIAL ULTRASOUND N/A 12/15/2017   Procedure: VIDEO BRONCHOSCOPY WITH ENDOBRONCHIAL ULTRASOUND;  Surgeon: Rigoberto Noel, MD;  Location: [facility];  Service: Thoracic;  Laterality: N/A;     SOCIAL HISTORY:  Social History   Socioeconomic History   Marital status: Widowed    Spouse name: Not on file   Number of children: Not on file  Years of education: Not on file   Highest education level: Not on file  Occupational History   Not on file  Social Needs   Financial resource strain: Not on file   Food insecurity:    Worry: Not on file    Inability: Not on file   Transportation needs:    Medical: Not on file    Non-medical: Not on file  Tobacco Use   Smoking status: Current Every Day Smoker    Packs/day: 0.40    Years: 70.00    Pack years: 28.00    Types: Cigarettes   Smokeless tobacco: Never Used   Tobacco comment: smokes 8 a day  Substance and Sexual Activity     Alcohol use: No    Comment: 11/26/2017  "drank from the age of 48 til 03/05/1993"   Drug use: Not Currently    Comment: 11/26/2017 "used some drugs when I was young"   Sexual activity: Not Currently  Lifestyle   Physical activity:    Days per week: Not on file    Minutes per session: Not on file   Stress: Not on file  Relationships   Social connections:    Talks on phone: Not on file    Gets together: Not on file    Attends religious service: Not on file    Active member of club or organization: Not on file    Attends meetings of clubs or organizations: Not on file    Relationship status: Not on file   Intimate partner violence:    Fear of current or ex partner: Not on file    Emotionally abused: Not on file    Physically abused: Not on file    Forced sexual activity: Not on file  Other Topics Concern   Not on file  Social History Narrative   Not on file    FAMILY HISTORY:  Family History  Adopted: Yes  Problem Relation Age of Onset   Cancer Mother    Obesity Father     CURRENT MEDICATIONS:  Outpatient Encounter Medications as of 04/15/2018  Medication Sig   ADVAIR HFA 115-21 MCG/ACT inhaler Inhale 2 puffs into the lungs every 12 (twelve) hours.   amLODipine (NORVASC) 5 MG tablet TAKE 1 TABLET BY MOUTH EVERY DAY (Patient taking differently: Take 5 mg by mouth daily. )   Ascorbic Acid (VITAMIN C) 500 MG CAPS Take 500 mg by mouth daily.   aspirin EC 81 MG tablet Take 81 mg by mouth daily.    celecoxib (CELEBREX) 200 MG capsule Take 1 capsule (200 mg total) by mouth daily.   cyclobenzaprine (FLEXERIL) 5 MG tablet Take 5 mg by mouth 2 (two) times daily.    Ferrous Sulfate (IRON) 325 (65 Fe) MG TABS Take 1 tablet by mouth daily.   fluticasone (FLONASE) 50 MCG/ACT nasal spray Place 2 sprays into both nostrils daily.   gabapentin (NEURONTIN) 300 MG capsule Take 1 capsule by mouth 3 (three) times daily.   ipratropium-albuterol (DUONEB) 0.5-2.5 (3) MG/3ML  SOLN Take 3 mLs by nebulization 3 (three) times daily.   lisinopril (PRINIVIL,ZESTRIL) 20 MG tablet Take 0.5 tablets (10 mg total) by mouth daily. (Patient taking differently: Take 20 mg by mouth daily. )   metoCLOPramide (REGLAN) 5 MG tablet Take 1 tablet by mouth 4 (four) times daily -  before meals and at bedtime.   metoprolol succinate (TOPROL-XL) 25 MG 24 hr tablet Take 25 mg by mouth daily.   mirabegron ER (MYRBETRIQ) 25  MG TB24 tablet Take 25 mg by mouth at bedtime.    Multiple Vitamins-Minerals (MULTIVITAMIN WITH MINERALS) tablet Take 1 tablet by mouth daily.   nitroGLYCERIN (NITROSTAT) 0.4 MG SL tablet Place 0.4 mg under the tongue every 5 (five) minutes as needed for chest pain.   omeprazole (PRILOSEC) 40 MG capsule Take 40 mg by mouth daily.    oxyCODONE (OXY IR/ROXICODONE) 5 MG immediate release tablet Take 1 tablet (5 mg total) by mouth every 4 (four) hours as needed for severe pain.   potassium gluconate (HM POTASSIUM) 595 (99 K) MG TABS tablet Take 595 mg by mouth daily.   simvastatin (ZOCOR) 10 MG tablet Take 10 mg by mouth at bedtime.    VENTOLIN HFA 108 (90 Base) MCG/ACT inhaler Inhale 2 puffs into the lungs every 4 (four) hours as needed for wheezing or shortness of breath.    buPROPion (WELLBUTRIN SR) 150 MG 12 hr tablet Take 1 tablet (150 mg total) by mouth 2 (two) times daily. Take 1 tablet daily for 1 week, stop smoking, then take twice daily after first week. (Patient not taking: Reported on 03/17/2018)   No facility-administered encounter medications on file as of 04/15/2018.     ALLERGIES:  Allergies  Allergen Reactions   Aspirin     Regular asa      PHYSICAL EXAM:  ECOG Performance status: 1  Vitals:   04/15/18 0818  BP: (!) 168/85  Pulse: 93  Resp: 18  Temp: 97.6 F (36.4 C)  SpO2: 97%   Filed Weights   04/15/18 0818  Weight: 160 lb (72.6 kg)    Physical Exam Constitutional:      Appearance: Normal appearance.  Cardiovascular:      Rate and Rhythm: Normal rate and regular rhythm.     Heart sounds: Normal heart sounds.  Pulmonary:     Effort: Pulmonary effort is normal.     Breath sounds: Normal breath sounds.  Abdominal:     General: Bowel sounds are normal. There is no distension.     Palpations: Abdomen is soft.  Musculoskeletal:        General: No swelling.  Skin:    General: Skin is warm.  Neurological:     General: No focal deficit present.     Mental Status: He is alert and oriented to person, place, and time.  Psychiatric:        Mood and Affect: Mood normal.        Behavior: Behavior normal.      LABORATORY DATA:  I have reviewed the labs as listed.  CBC    Component Value Date/Time   WBC 18.9 (H) 03/17/2018 1125   RBC 4.17 (L) 03/17/2018 1125   HGB 12.5 (L) 03/17/2018 1125   HCT 39.9 03/17/2018 1125   PLT 518 (H) 03/17/2018 1125   MCV 95.7 03/17/2018 1125   MCH 30.0 03/17/2018 1125   MCHC 31.3 03/17/2018 1125   RDW 16.6 (H) 03/17/2018 1125   LYMPHSABS 2.0 01/01/2018 0535   MONOABS 1.3 (H) 01/01/2018 0535   EOSABS 0.5 01/01/2018 0535   BASOSABS 0.1 01/01/2018 0535   CMP Latest Ref Rng & Units 12/30/2017 12/29/2017 12/15/2017  Glucose 70 - 99 mg/dL 80 116(H) 99  BUN 8 - 23 mg/dL '17 20 15  ' Creatinine 0.61 - 1.24 mg/dL 0.82 1.07 0.85  Sodium 135 - 145 mmol/L 137 135 138  Potassium 3.5 - 5.1 mmol/L 3.8 3.9 3.8  Chloride 98 - 111 mmol/L 109 105 106  CO2 22 - 32 mmol/L '22 22 23  ' Calcium 8.9 - 10.3 mg/dL 8.1(L) 8.6(L) 8.9  Total Protein 6.5 - 8.1 g/dL - 7.4 -  Total Bilirubin 0.3 - 1.2 mg/dL - 1.2 -  Alkaline Phos 38 - 126 U/L - 109 -  AST 15 - 41 U/L - 15 -  ALT 0 - 44 U/L - 12 -       DIAGNOSTIC IMAGING:  I have independently reviewed the scans and discussed with the patient.   I have reviewed Venita Lick LPN's note and agree with the documentation.  I personally performed a face-to-face visit, made revisions and my assessment and plan is as follows.    ASSESSMENT & PLAN:     Non-small cell lung cancer ([place]) 1.  Non-small cell lung cancer, adenocarcinoma type: -he was evaluated by me as inpatient when he was hospitalized on 12/29/2017. - Left lower lobe endobronchial biopsy on 12/15/2017 shows non-small cell lung cancer, TTF-1 positive consistent with pulmonary adenocarcinoma. - He was reluctant to consider any chemotherapy when he was inpatient. -He also developed new lesions in the lung, most likely infectious -MRI of the brain on 12/29/2017 did not show any evidence of intracranial metastasis.. -PET/CT scan on 02/16/2018 shows dominant perihilar left lung mass with mildly increased in size.  There is new hypermetabolic nodule in the left upper lobe and new hypermetabolic lesion in the dome of the liver.  Right upper lobe lung nodules have slightly decreased in size.  Similar appearance of hypermetabolic mediastinal and hilar adenopathy.  - Rebiopsy of the liver lesion on 03/17/2018 for molecular studies shows poorly differentiated carcinoma. - Foundation 1 testing shows MS-stable, TMB 20 Muts/Mb, BRAF amplification, MET amplification, MYC and KIT amplification, PDGFRA/SOX2 amplifications - There was not enough tissue for PDL 1 testing. - We talked about the combination immunotherapy with ipilimumab and nivolumab.  We discussed side effects in detail.  He is agreeable to try immunotherapy but is reluctant to consider chemotherapy. - We have discussed the need for port placement.  We will start out with his peripheral veins.  Total time spent is 40 minutes with more than 50% of the time spent face-to-face discussing treatment options, side effects and further coordination of care.    Orders placed this encounter:  No orders of the defined types were placed in this encounter.     Derek Jack, MD [facility] [phone]

## 2018-04-15 NOTE — Assessment & Plan Note (Signed)
1.  Non-small cell lung cancer, adenocarcinoma type: -he was evaluated by me as inpatient when he was hospitalized on 12/29/2017. - Left lower lobe endobronchial biopsy on 12/15/2017 shows non-small cell lung cancer, TTF-1 positive consistent with pulmonary adenocarcinoma. - He was reluctant to consider any chemotherapy when he was inpatient. -He also developed new lesions in the lung, most likely infectious -MRI of the brain on 12/29/2017 did not show any evidence of intracranial metastasis.. -PET/CT scan on 02/16/2018 shows dominant perihilar left lung mass with mildly increased in size.  There is new hypermetabolic nodule in the left upper lobe and new hypermetabolic lesion in the dome of the liver.  Right upper lobe lung nodules have slightly decreased in size.  Similar appearance of hypermetabolic mediastinal and hilar adenopathy.  - Rebiopsy of the liver lesion on 03/17/2018 for molecular studies shows poorly differentiated carcinoma. - Foundation 1 testing shows MS-stable, TMB 20 Muts/Mb, BRAF amplification, MET amplification, MYC and KIT amplification, PDGFRA/SOX2 amplifications - There was not enough tissue for PDL 1 testing. - We talked about the combination immunotherapy with ipilimumab and nivolumab.  We discussed side effects in detail.  He is agreeable to try immunotherapy but is reluctant to consider chemotherapy. - We have discussed the need for port placement.  We will start out with his peripheral veins.

## 2018-04-15 NOTE — Patient Instructions (Addendum)
[facility] at [facility] Discharge Instructions  You were seen today by Dr. Delton Coombes. He went over your recent test results. He discussed your treatment and the side effects you may have.  He will see you back in 1 week for treatment. Return in 4 weeks for labs and follow up.   Thank you for choosing [facility] at [facility] to provide your oncology and hematology care.  To afford each patient quality time with our provider, please arrive at least 15 minutes before your scheduled appointment time.   If you have a lab appointment with the [facility] please come in thru the  Main Entrance and check in at the main information desk  You need to re-schedule your appointment should you arrive 10 or more minutes late.  We strive to give you quality time with our providers, and arriving late affects you and other patients whose appointments are after yours.  Also, if you no show three or more times for appointments you may be dismissed from the clinic at the providers discretion.     Again, thank you for choosing [facility].  Our hope is that these requests will decrease the amount of time that you wait before being seen by our physicians.       _____________________________________________________________  Should you have questions after your visit to [facility], please contact our office at [phone] between the hours of 8:00 a.m. and 4:30 p.m.  Voicemails left after 4:00 p.m. will not be returned until the following business day.  For prescription refill requests, have your pharmacy contact our office and allow 72 hours.    Cancer Center Support Programs:   > Cancer Support Group  2nd Tuesday of the month 1pm-2pm, Journey Room

## 2018-04-15 NOTE — Progress Notes (Signed)
START OFF PATHWAY REGIMEN - Non-Small Cell Lung   [id number]:Ipilimumab 1 mg/kg IV D1 + Nivolumab 3 mg/kg IV D1,15,29 q42 Days:   A cycle is every 42 days:     Ipilimumab      Nivolumab   **Always confirm dose/schedule in your pharmacy ordering system**  Patient Characteristics: Stage IV Metastatic, Nonsquamous, Molecular Targeted Therapy, Initial Molecular Targeted Therapy, Other Mutations/Biomarkers - Molecular Targeted Therapy, c-MET AJCC T Category: T2a Current Disease Status: Distant Metastases AJCC N Category: N2 AJCC M Category: M1c AJCC 8 Stage Grouping: IVB Histology: Nonsquamous Cell ROS1 Rearrangement Status: Negative T790M Mutation Status: Not Applicable - EGFR Mutation Negative/Unknown Other Mutations/Biomarkers: Yes NTRK Gene Fusion Status: Negative PD-L1 Expression Status: Quantity Not Sufficient Chemotherapy/Immunotherapy LOT: Not Appropriate Molecular Targeted Therapy: Initial Molecular Targeted Therapy ALK Translocation Status: Negative EGFR Mutation Status: Negative/Wild Type BRAF [id number] Mutation Status: Negative Intent of Therapy: Non-Curative / Palliative Intent, Discussed with Patient

## 2018-04-16 ENCOUNTER — Other Ambulatory Visit (HOSPITAL_COMMUNITY): Payer: Self-pay | Admitting: Hematology

## 2018-04-16 ENCOUNTER — Encounter (HOSPITAL_COMMUNITY): Payer: Self-pay | Admitting: Hematology

## 2018-04-16 ENCOUNTER — Encounter (HOSPITAL_COMMUNITY): Payer: Self-pay | Admitting: *Deleted

## 2018-04-16 DIAGNOSIS — J438 Other emphysema: Secondary | ICD-10-CM | POA: Diagnosis not present

## 2018-04-16 DIAGNOSIS — J189 Pneumonia, unspecified organism: Secondary | ICD-10-CM | POA: Diagnosis not present

## 2018-04-16 DIAGNOSIS — S72009A Fracture of unspecified part of neck of unspecified femur, initial encounter for closed fracture: Secondary | ICD-10-CM | POA: Diagnosis not present

## 2018-04-16 NOTE — Progress Notes (Signed)
I called to introduce myself to patient and advise him of the medications Dr. Delton Coombes wants to start him on. 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  Patient voiced his concern about not having good veins and his concern about being stuck multiple times.  He said that they doctor talked to him about putting a defibrillator in his chest.  I reeducated patient and explained that it is not a defibrillator it is a port a cath. We use the port to access his blood and deliver the medications safer.  Patient said "okay well I couldn't remember what he said it was, but I want it".    I have called our schedulers and ask that they put in a referral for general surgeon to place port a cath.

## 2018-04-19 NOTE — Patient Instructions (Signed)
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  You will be treated with Yervoy (ipilimumab) and Opdivo (nivolumab).  You will receive both medications on the first day of treatment, then continue with Opdivo every 2 weeks and Yervoy every 6 weeks. While the cancer you have is treatable, it is not curable. The intent of this treatment is palliative, which means it is meant to control your cancer by preventing further growth and spread, and to help alleviate any symptoms you may be having related to this cancer. You will see the doctor regularly throughout treatment.  We monitor your lab work prior to every treatment. The doctor monitors your response to treatment by the way you are feeling, your blood work, and scans periodically.  There will be wait times while you are here for treatment.  It will take about 30 minutes to 1 hour for your lab work to result.  Then there will be wait times while pharmacy mixes your medications.   Altagracia.Kinds        Generic name : Ipilimumab   How Yervoy  Is Given:  Curt Bears is given as an intravenous injection through a vein (IV). It helps your immune system work properly to target and kill cancer cells.  Side Effects:   Most people do not experience all of the side effects listed.  Side effects are often predictable in terms of their onset and duration.  A few side effects can occur weeks or months after discontinuation of treatment.  There are many options to help manage and prevent worsening of side effects.   The following side effects are common (occurring in greater than 30%) for patients taking Yervoy:   -Fatigue  -Diarrhea  -Itching  -Rash  -Nausea/vomiting   These side effects are less common side effects (occurring in about 10-29%) of patients receiving Yervoy:  -Decreased appetite  -Constipation  -Cough  -Headache  -Abdominal pain  -Shortness of breath  -Anemia  -Fever    A serious, but uncommon side effect of Yervoy may be an immune-mediated reaction. When this side effect occurs, it affects primarily the bowels, liver, skin, nerves and the endocrine system. This immune reaction can occur during treatment but can also be seen weeks or months after discontinuation of treatment. Symptoms of this reaction will be monitored throughout treatment (diarrhea, rash, and neuropathy). Lab work will check for elevated liver enzymes and thyroid function.   Contact your health care provider right away if you have any new or worsening symptoms.   Not all side effects are listed above. Some that are rare (occurring in less than 10% of patients) are not listed here. However, you should always inform your health care provider if you experience any unusual symptoms.   When to contact your doctor or health care provider:  Contact your health care provider immediately, day or night, if you should experience any of the following symptoms:   -Fever of 100.4 F (38 or higher)  -Chills  -Signs of reaction to the drug (wheezing, chest tightness, itching, bad cough, swelling of the face, lips, tongue or throat).  -Diarrhea  -Blood in your stools or dark, tarry, sticky stools  -Stomach pain or tenderness, especially right side  -Nausea (interferes with ability to eat and unrelieved with prescribed medication)  -Vomiting (vomiting more than 4-5 times in a 24 hour period)  -Unable to eat or drink for 24 hours or have signs of  dehydration: tiredness, thirst, dry mouth, dark and decrease amount of urine, or dizziness  -Skin or the whites of your eyes turn yellow  -Urine turns dark or brown (tea color)  -Decreased appetite  -Bleed or bruise more easily than normal  -Skin rash with or without itching  -Sores in the mouth  -Skin blisters and/or peels  -Unusual weakness of legs, arms or face  -Numbness or tingling in hands or feet  -Persistent or unusual headaches  -Unusual  sluggishness, feeling cold all the time, or weight gain  -Changes in mood or behavior such as decreased sex drive, irritability or forgetfulness  -Dizziness or fainting  -Blurry vision, double vision or other vision problems  -Eye pain or redness   Always inform your health care provider if you experience any unusual symptoms.    Precautions:  Before starting Yervoy treatment, make sure you tell your doctor about any other medications you are taking (including prescription, over-the-counter, vitamins, herbal remedies, etc.).  Do not receive any kind of immunization or vaccination without your doctor's approval while taking Yervoy.  Inform your health care professional if you are pregnant or may be pregnant prior to starting this treatment. Pregnancy category C Curt Bears may be hazardous to the fetus. Women who are pregnant or become pregnant must be advised of the potential hazard to the fetus.)  For both men and women: Do not conceive a child (get pregnant) while taking Yervoy. Barrier methods of contraception, such as condoms, are recommended.    Nivolumab (Opdivo)  About This Drug  Nivolumab is used to treat cancer. It is given in the vein (IV).  Possible Side Effects  . Bone marrow depression. This is a decrease in the number of white blood cells, red blood cells, and platelets. This may raise your risk of infection, make you tired and weak (fatigue), and raise your risk of bleeding.  . Tiredness and weakness  . Joint, muscle and bone pain  . Back pain  . Loose bowel movements (diarrhea)  . Nausea  . Decreased appetite (decreased hunger)  . Constipation (not able to move bowels)  . Cough and trouble breathing  . Upper respiratory infection  . Fever  . Rash and itching  . Electrolyte changes  Note: Each of the side effects above was reported in 20% or greater of patients treated with nivolumab. Not all possible side effects are included above. Your side effects  may be different or more severe if you receive nivolumab in combination with other chemotherapy agents.  Warnings and Precautions  . This drug works with your immune system and can cause inflammation in any of your organs and tissues and can change how they work. This may put you at risk for developing serious medical problems which can very rarely be fatal.  . Colitis (swelling (inflammation) in the colon) - symptoms are loose bowel movements (diarrhea) stomach cramping, and sometimes blood in the bowel movements  . Changes in liver function  . Changes in kidney function  . Inflammation (swelling) of the lungs which can very rarely be fatal - you may have a dry cough or trouble breathing.  . This drug may affect some of your hormone glands (especially the thyroid, adrenals, pituitary and pancreas).  . Blood sugar levels may change and you may develop diabetes. If you already have diabetes, changes may need to be made to your diabetes medication.  . Severe allergic skin reaction which can very rarely be fatal. You may develop blisters on your  skin that are filled with fluid or a severe red rash all over your body that may be painful.  . Changes in your central nervous system can happen. The central nervous system is made up of your brain and spinal cord. You could feel extreme tiredness, agitation, confusion, hallucinations (see or hear things that are not there), trouble understanding or speaking, loss of control of your bowels or bladder, eyesight changes, numbness or lack of strength to your arms, legs, face, or body, and coma. If you start to have any of these symptoms let your doctor know right away.  . While you are getting this drug in your vein (IV), you may have a reaction to the drug. Sometimes you may be given medication to stop or lessen these side effects. Your nurse will check you closely for these signs: fever or shaking chills, flushing, facial swelling, feeling dizzy,  headache, trouble breathing, rash, itching, chest tightness, or chest pain. These reactions may happen after your infusion. If this happens, call 911 for emergency care.  . Increased risk of complications, which may very rarely be fatal, in patients who will undergo a stem cell transplant after receiving nivolumab.  Important Information  . This drug may be present in the saliva, tears, sweat, urine, stool, vomit, semen, and vaginal secretions. Talk to your doctor and/or your nurse about the necessary precautions to take during this time.  Treating Side Effects  . To decrease infection, wash your hands regularly  . Avoid close contact with people who have a cold, the flu, or other infections.  . Take your temperature as your doctor or nurse tells you, and whenever you feel like you may have a fever  . To help decrease bleeding, use a soft toothbrush. Check with your nurse before using dental floss.  . Be very careful when using knives or tools  . Use an electric shaver instead of a razor  . Ask your doctor or nurse about medicines that are available to help stop or lessen constipation, diarrhea and/or nausea.  . Drink plenty of fluids (a minimum of eight glasses per day is recommended).  . If you are not able to move your bowels, check with your doctor or nurse before you use any enemas, laxatives, or suppositories  . To help with nausea and vomiting, eat small, frequent meals instead of three large meals a day. Choose foods and drinks that are at room temperature. Ask your nurse or doctor about other helpful tips and medicine that is available to help or stop lessen these symptoms.  . If you get diarrhea, eat low-fiber foods that are high in protein and calories and avoid foods that can irritate your digestive tracts or lead to cramping. Ask your nurse or doctor about medicine that can lessen or stop your diarrhea.  . To help with decreased appetite, eat small, frequent meals  .  Eat high caloric food such as pudding, ice cream, yogurt and milkshakes.  . Manage tiredness by pacing your activities for the day. Be sure to include periods of rest between energy-draining activities  . Keeping your pain under control is important to your wellbeing. Please tell your doctor or nurse if you are experiencing pain.  . If you have diabetes, keep good control of your blood sugar level. Tell your nurse or your doctor if your glucose levels are higher or lower than normal  . If you get a rash do not put anything on it unless your doctor or nurse  says you may. Keep the area around the rash clean and dry. Ask your doctor for medicine if your rash bothers you.  . Infusion reactions may occur after your infusion. If this happens, call 911 for emergency care.  Food and Drug Interactions  . There are no known interactions of nivolumab with food or other medications.  . Tell your doctor and pharmacist about all the medicines and dietary supplements (vitamins, minerals, herbs and others) that you are taking at this time. The safety and use of dietary supplements and alternative diets are often not known. Using these might affect your cancer or interfere with your treatment. Until more is known, you should not use dietary supplements or alternative diets without your cancer doctor's help.  When to Call the Doctor  Call your doctor or nurse if you have any of these symptoms and/or any new or unusual symptoms:  . Fever of 100.5 F (38 C) or higher  . Chills  . Easy bleeding or bruising  . Wheezing or trouble breathing  . Dry cough, or cough with yellow, green or bloody mucus  . Confusion and/or agitation  . Hallucinations  . Trouble understanding or speaking  . Blurry vision or changes in your eyesight  . Numbness or lack of strength to your arms, legs, face, or body  . Feeling dizzy or lightheaded  . Loose bowel movements (diarrhea) more than 4 times a day or diarrhea  with weakness or lightheadedness  . Nausea that stops you from eating or drinking, and/or that is not relieved by prescribed medicines  . Lasting loss of appetite or rapid weight loss of five pounds in a week  . No bowel movement for 3 days or you feel uncomfortable  . Bad abdominal pain, especially in upper right area  . Fatigue or extreme weakness that interferes with normal activities  . Decreased urine  . Unusual thirst or passing urine often  . Rash that is not relieved by prescribed medicines  . Rash or itching  . Flu-like symptoms: fever, headache, muscle and joint aches, and fatigue (low energy, feeling weak)  . Signs of liver problems: dark urine, pale bowel movements, bad stomach pain, feeling very tired and weak, unusual itching, or yellowing of the eyes or skin.  . Signs of infusion reaction: fever or shaking chills, flushing, facial swelling, feeling dizzy, headache, trouble breathing, rash, itching, chest tightness, or chest pain.  . If you think you may be pregnant  Reproduction Warnings  . Pregnancy warning: This drug can have harmful effects on the unborn baby. Women of child bearing potential should use effective methods of birth control during your cancer treatment and for at least 5 months after treatment. Let your doctor know right away if you think you may be pregnant.  . Breastfeeding warning: It is not known if this drug passes into breast milk. For this reason, women should not breast feed during treatment because this drug could enter the breast milk and cause harm to a breast feeding baby.  . Fertility warning: Human fertility studies have not been done with this drug. Talk with your doctor or nurse if you plan to have children. Ask for information on sperm or egg banking.  SELF CARE ACTIVITIES WHILE ON CHEMOTHERAPY/IMMUNOTHERAPY:  Hydration Increase your fluid intake 48 hours prior to treatment and drink at least 8 to 12 cups (64 ounces) of  water/decaffeinated beverages per day after treatment. You can still have your cup of coffee or soda but these  beverages do not count as part of your 8 to 12 cups that you need to drink daily. No alcohol intake.  Medications Continue taking your normal prescription medication as prescribed.  If you start any new herbal or new supplements please let us know first to make sure it is safe.  Mouth Care Have teeth cleaned professionally before starting treatment. Keep dentures and partial plates clean. Use soft toothbrush and do not use mouthwashes that contain alcohol. Biotene is a good mouthwash that is available at most pharmacies or may be ordered by calling [phone]. Use warm salt water gargles (1 teaspoon salt per 1 quart warm water) before and after meals and at bedtime. Or you may rinse with 2 tablespoons of three-percent hydrogen peroxide mixed in eight ounces of water. If you are still having problems with your mouth or sores in your mouth please call the clinic. If you need dental work, please let the doctor know before you go for your appointment so that we can coordinate the best possible time for you in regards to your chemo regimen. You need to also let your dentist know that you are actively taking chemo. We may need to do labs prior to your dental appointment.  Skin Care Always use sunscreen that has not expired and with SPF (Sun Protection Factor) of 50 or higher. Wear hats to protect your head from the sun. Remember to use sunscreen on your hands, ears, face, & feet.  Use good moisturizing lotions such as udder cream, eucerin, or even Vaseline. Some chemotherapies can cause dry skin, color changes in your skin and nails.    . Avoid long, hot showers or baths. . Use gentle, fragrance-free soaps and laundry detergent. . Use moisturizers, preferably creams or ointments rather than lotions because the thicker consistency is better at preventing skin dehydration. Apply the cream or  ointment within 15 minutes of showering. Reapply moisturizer at night, and moisturize your hands every time after you wash them.    Infection Prevention Please wash your hands for at least 30 seconds using warm soapy water. Handwashing is the #1 way to prevent the spread of germs. Stay away from sick people or people who are getting over a cold. If you develop respiratory systems such as green/yellow mucus production or productive cough or persistent cough let us know and we will see if you need an antibiotic. It is a good idea to keep a pair of gloves on when going into grocery stores/[employer] to decrease your risk of coming into contact with germs on the carts, etc. Carry alcohol hand gel with you at all times and use it frequently if out in public. If your temperature reaches 100.5 or higher please call the clinic and let us know.  If it is after hours or on the weekend please go to the ER if your temperature is over 100.5.  Please have your own personal thermometer at home to use.    Sex and bodily fluids If you are going to have sex, a condom must be used to protect the person that isn't taking chemotherapy. Chemo can decrease your libido (sex drive). For a few days after chemotherapy, chemotherapy can be excreted through your bodily fluids.  When using the toilet please close the lid and flush the toilet twice.  Do this for a few day after you have had chemotherapy.   Contraception It's important to use reliable contraception during treatment. Avoid getting pregnant while you or your partner are having  chemotherapy. This is because the drugs may harm the baby. Sometimes chemotherapy drugs can leave a man or woman infertile.  This means you would not be able to have children in the future. You might want to talk to someone about permanent infertility. It can be very difficult to learn that you may no longer be able to have children. Some people find counselling helpful. There might be ways to  preserve your fertility, although this is easier for men than for women. You may want to speak to a fertility expert. You can talk about sperm banking or harvesting your eggs. You can also ask about other fertility options, such as donor eggs. If you have or have had breast cancer, your doctor might advise you not to take the contraceptive pill. This is because the hormones in it might affect the cancer.  It is not known for sure whether or not chemotherapy drugs can be passed on through semen or secretions from the vagina. Because of this some doctors advise people to use a barrier method if you have sex during treatment. This applies to vaginal, anal or oral sex. Generally, doctors advise a barrier method only for the time you are actually having the treatment and for about a week after your treatment. Advice like this can be worrying, but this does not mean that you have to avoid being intimate with your partner. You can still have close contact with your partner and continue to enjoy sex.  Animals If you have cats or birds we just ask that you not change the litter or change the cage.  Please have someone else do this for you while you are on chemotherapy.   Food Safety During and After Cancer Treatment Food safety is important for people both during and after cancer treatment. Cancer and cancer treatments, such as chemotherapy, radiation therapy, and stem cell/bone marrow transplantation, often weaken the immune system. This makes it harder for your body to protect itself from foodborne illness, also called food poisoning. Foodborne illness is caused by eating food that contains harmful bacteria, parasites, or viruses.  Foods to avoid Some foods have a higher risk of becoming tainted with bacteria. These include: [name] Unwashed fresh fruit and vegetables, especially leafy vegetables that can hide dirt and other contaminants . Raw sprouts, such as alfalfa sprouts . Raw or undercooked beef, especially  ground beef, or other raw or undercooked meat and poultry . Fatty, fried, or spicy foods immediately before or after treatment.  These can sit heavy on your stomach and make you feel nauseous. . Raw or undercooked shellfish, such as oysters. . Sushi and sashimi, which often contain raw fish.  . Unpasteurized beverages, such as unpasteurized fruit juices, raw milk, raw yogurt, or cider . Undercooked eggs, such as soft boiled, over easy, and poached; raw, unpasteurized eggs; or foods made with raw egg, such as homemade raw cookie dough and homemade mayonnaise  Simple steps for food safety  Shop smart. . Do not buy food stored or displayed in an unclean area. . Do not buy bruised or damaged fruits or vegetables. . Do not buy cans that have cracks, dents, or bulges. . Pick up foods that can spoil at the end of your shopping trip and store them in a cooler on the way home.  Prepare and clean up foods carefully. . Rinse all fresh fruits and vegetables under running water, and dry them with a clean towel or paper towel. . Clean the top of cans  before opening them. . After preparing food, wash your hands for 20 seconds with hot water and soap. Pay special attention to areas between fingers and under nails. . Clean your utensils and dishes with hot water and soap. [name] Disinfect your kitchen and cutting boards using 1 teaspoon of liquid, unscented bleach mixed into 1 quart of water.    Dispose of old food. . Eat canned and packaged food before its expiration date (the "use by" or "best before" date). . Consume refrigerated leftovers within 3 to 4 days. After that time, throw out the food. Even if the food does not smell or look spoiled, it still may be unsafe. Some bacteria, such as Listeria, can grow even on foods stored in the refrigerator if they are kept for too long.  Take precautions when eating out. . At restaurants, avoid buffets and salad bars where food sits out for a long time and comes in  contact with many people. Food can become contaminated when someone with a virus, often a norovirus, or another "bug" handles it. . Put any leftover food in a "to-go" container yourself, rather than having the server do it. And, refrigerate leftovers as soon as you get home. . Choose restaurants that are clean and that are willing to prepare your food as you order it cooked.   MEDICATIONS:                                                                                                                                                                Compazine/Prochlorperazine 10mg  tablet. Take 1 tablet every 6 hours as needed for nausea/vomiting. (This can make you sleepy)   EMLA cream. Apply a quarter size amount to port site 1 hour prior to chemo. Do not rub in. Cover with plastic wrap.   Over-the-Counter Meds:  Colace - 100 mg capsules - take 2 capsules daily.  If this doesn't help then you can increase to 2 capsules twice daily.  Call us if this does not help your bowels move.     Always call the [facility] if you are having loose stools/diarrhea that you can't get under control.  Loose stools/diarrhea leads to dehydration (loss of water) in your body.  We have other options of trying to get the loose stools/diarrhea to stop but you must let us know!   Constipation Sheet  Colace - 100 mg capsules - take 2 capsules daily.  If this doesn't help then you can increase to 2 capsules twice daily. Please call if the above does not work for you.   Do not go more than 2 days without a bowel movement.  It is very important that you do not become constipated.  It will make you feel sick to your stomach (nausea) and  can cause abdominal pain and vomiting.   Nausea Sheet   Compazine/Prochlorperazine 10mg  tablet. Take 1 tablet every 6 hours as needed for nausea/vomiting. (This can make you sleepy)  If you are having persistent nausea (nausea that does not stop) please call the [facility]  and let us know the amount of nausea that you are experiencing.  If you begin to vomit, you need to call the [facility] and if it is the weekend and you have vomited more than one time and can't get it to stop-go to the Emergency Room.  Persistent nausea/vomiting can lead to dehydration (loss of fluid in your body) and will make you feel terrible.   Ice chips, sips of clear liquids, foods that are @ room temperature, crackers, and toast tend to be better tolerated.   SYMPTOMS TO REPORT AS SOON AS POSSIBLE AFTER TREATMENT:   FEVER GREATER THAN 100.5 F  CHILLS WITH OR WITHOUT FEVER  NAUSEA AND VOMITING THAT IS NOT CONTROLLED WITH YOUR NAUSEA MEDICATION  UNUSUAL SHORTNESS OF BREATH  UNUSUAL BRUISING OR BLEEDING  TENDERNESS IN MOUTH AND THROAT WITH OR WITHOUT PRESENCE OF ULCERS  URINARY PROBLEMS  BOWEL PROBLEMS  UNUSUAL RASH      Wear comfortable clothing and clothing appropriate for easy access to any Portacath or PICC line. Let us know if there is anything that we can do to make your therapy better!    What to do if you need assistance after hours or on the weekends: CALL [phone].  HOLD on the line, do not hang up.  You will hear multiple messages but at the end you will be connected with a nurse triage line.  They will contact the doctor if necessary.  Most of the time they will be able to assist you.  Do not call the hospital operator.      I have been informed and understand all of the instructions given to me and have received a copy. I have been instructed to call the clinic [phone] or my family physician as soon as possible for continued medical care, if indicated. I do not have any more questions at this time but understand that I may call the [facility] or the Patient Navigator at [phone] during office hours should I have questions or need assistance in obtaining follow-up care.

## 2018-04-20 ENCOUNTER — Inpatient Hospital Stay (HOSPITAL_COMMUNITY): Payer: Medicare Other

## 2018-04-20 ENCOUNTER — Other Ambulatory Visit: Payer: Self-pay

## 2018-04-20 DIAGNOSIS — C3492 Malignant neoplasm of unspecified part of left bronchus or lung: Secondary | ICD-10-CM

## 2018-04-20 DIAGNOSIS — Z5112 Encounter for antineoplastic immunotherapy: Secondary | ICD-10-CM | POA: Diagnosis not present

## 2018-04-20 DIAGNOSIS — Z79899 Other long term (current) drug therapy: Secondary | ICD-10-CM | POA: Diagnosis not present

## 2018-04-20 DIAGNOSIS — C3432 Malignant neoplasm of lower lobe, left bronchus or lung: Secondary | ICD-10-CM | POA: Diagnosis not present

## 2018-04-20 LAB — CBC WITH DIFFERENTIAL/PLATELET
Abs Immature Granulocytes: 0.05 10*3/uL (ref 0.00–0.07)
Basophils Absolute: 0.1 10*3/uL (ref 0.0–0.1)
Basophils Relative: 1 %
Eosinophils Absolute: 0.3 10*3/uL (ref 0.0–0.5)
Eosinophils Relative: 3 %
HCT: 45.6 % (ref 39.0–52.0)
Hemoglobin: 14.3 g/dL (ref 13.0–17.0)
Immature Granulocytes: 0 %
Lymphocytes Relative: 25 %
Lymphs Abs: 3.1 10*3/uL (ref 0.7–4.0)
MCH: 29.2 pg (ref 26.0–34.0)
MCHC: 31.4 g/dL (ref 30.0–36.0)
MCV: 93.3 fL (ref 80.0–100.0)
Monocytes Absolute: 0.7 10*3/uL (ref 0.1–1.0)
Monocytes Relative: 6 %
Neutro Abs: 8.1 10*3/uL — ABNORMAL HIGH (ref 1.7–7.7)
Neutrophils Relative %: 65 %
Platelets: 528 10*3/uL — ABNORMAL HIGH (ref 150–400)
RBC: 4.89 MIL/uL (ref 4.22–5.81)
RDW: 16.1 % — ABNORMAL HIGH (ref 11.5–15.5)
WBC: 12.4 10*3/uL — ABNORMAL HIGH (ref 4.0–10.5)
nRBC: 0 % (ref 0.0–0.2)

## 2018-04-20 LAB — COMPREHENSIVE METABOLIC PANEL
ALT: 15 U/L (ref 0–44)
AST: 21 U/L (ref 15–41)
Albumin: 3.7 g/dL (ref 3.5–5.0)
Alkaline Phosphatase: 55 U/L (ref 38–126)
Anion gap: 8 (ref 5–15)
BUN: 18 mg/dL (ref 8–23)
CO2: 24 mmol/L (ref 22–32)
CREATININE: 0.84 mg/dL (ref 0.61–1.24)
Calcium: 9.1 mg/dL (ref 8.9–10.3)
Chloride: 107 mmol/L (ref 98–111)
GFR calc Af Amer: >60 mL/min (ref >60)
GFR calc non Af Amer: >60 mL/min (ref >60)
Glucose, Bld: 98 mg/dL (ref 70–99)
Potassium: 4.4 mmol/L (ref 3.5–5.1)
Sodium: 139 mmol/L (ref 135–145)
Total Bilirubin: 0.6 mg/dL (ref 0.3–1.2)
Total Protein: 8.6 g/dL — ABNORMAL HIGH (ref 6.5–8.1)

## 2018-04-20 LAB — TSH: TSH: 1.243 u[IU]/mL (ref 0.350–4.500)

## 2018-04-20 MED ORDER — OCTREOTIDE ACETATE 20 MG IM KIT
PACK | INTRAMUSCULAR | Status: AC
  Filled 2018-04-20: qty 1

## 2018-04-21 ENCOUNTER — Other Ambulatory Visit: Payer: Self-pay

## 2018-04-21 ENCOUNTER — Encounter (HOSPITAL_COMMUNITY)
Admission: RE | Admit: 2018-04-21 | Discharge: 2018-04-21 | Disposition: A | Discharge status: Home / Self Care | Payer: Medicare Other | Source: Ambulatory Visit | Attending: General Surgery | Admitting: General Surgery

## 2018-04-21 ENCOUNTER — Encounter (HOSPITAL_COMMUNITY): Payer: Self-pay

## 2018-04-22 ENCOUNTER — Other Ambulatory Visit (HOSPITAL_COMMUNITY): Payer: Medicare Other

## 2018-04-22 ENCOUNTER — Ambulatory Visit (HOSPITAL_COMMUNITY): Payer: Medicare Other

## 2018-04-23 ENCOUNTER — Ambulatory Visit (HOSPITAL_COMMUNITY): Payer: Medicare Other

## 2018-04-23 ENCOUNTER — Encounter (HOSPITAL_COMMUNITY)
Admission: RE | Disposition: A | Discharge status: Home / Self Care | Payer: Self-pay | Source: Home / Self Care | Attending: General Surgery

## 2018-04-23 ENCOUNTER — Ambulatory Visit (HOSPITAL_COMMUNITY): Payer: Medicare Other | Admitting: Anesthesiology

## 2018-04-23 ENCOUNTER — Other Ambulatory Visit: Payer: Self-pay

## 2018-04-23 ENCOUNTER — Ambulatory Visit (HOSPITAL_COMMUNITY)
Admission: RE | Admit: 2018-04-23 | Discharge: 2018-04-23 | Disposition: A | Discharge status: Home / Self Care | Payer: Medicare Other | Attending: General Surgery | Admitting: General Surgery

## 2018-04-23 DIAGNOSIS — I252 Old myocardial infarction: Secondary | ICD-10-CM | POA: Insufficient documentation

## 2018-04-23 DIAGNOSIS — Z7982 Long term (current) use of aspirin: Secondary | ICD-10-CM | POA: Diagnosis not present

## 2018-04-23 DIAGNOSIS — Z96652 Presence of left artificial knee joint: Secondary | ICD-10-CM | POA: Diagnosis not present

## 2018-04-23 DIAGNOSIS — I10 Essential (primary) hypertension: Secondary | ICD-10-CM | POA: Diagnosis not present

## 2018-04-23 DIAGNOSIS — F17210 Nicotine dependence, cigarettes, uncomplicated: Secondary | ICD-10-CM | POA: Diagnosis not present

## 2018-04-23 DIAGNOSIS — Z791 Long term (current) use of non-steroidal anti-inflammatories (NSAID): Secondary | ICD-10-CM | POA: Diagnosis not present

## 2018-04-23 DIAGNOSIS — Z95828 Presence of other vascular implants and grafts: Secondary | ICD-10-CM

## 2018-04-23 DIAGNOSIS — C3492 Malignant neoplasm of unspecified part of left bronchus or lung: Secondary | ICD-10-CM | POA: Insufficient documentation

## 2018-04-23 DIAGNOSIS — Z79899 Other long term (current) drug therapy: Secondary | ICD-10-CM | POA: Insufficient documentation

## 2018-04-23 DIAGNOSIS — Z452 Encounter for adjustment and management of vascular access device: Secondary | ICD-10-CM | POA: Diagnosis not present

## 2018-04-23 DIAGNOSIS — Z96641 Presence of right artificial hip joint: Secondary | ICD-10-CM | POA: Diagnosis not present

## 2018-04-23 DIAGNOSIS — Z7951 Long term (current) use of inhaled steroids: Secondary | ICD-10-CM | POA: Diagnosis not present

## 2018-04-23 DIAGNOSIS — I2510 Atherosclerotic heart disease of native coronary artery without angina pectoris: Secondary | ICD-10-CM | POA: Diagnosis not present

## 2018-04-23 DIAGNOSIS — G629 Polyneuropathy, unspecified: Secondary | ICD-10-CM | POA: Diagnosis not present

## 2018-04-23 DIAGNOSIS — J449 Chronic obstructive pulmonary disease, unspecified: Secondary | ICD-10-CM | POA: Diagnosis not present

## 2018-04-23 DIAGNOSIS — E7800 Pure hypercholesterolemia, unspecified: Secondary | ICD-10-CM | POA: Insufficient documentation

## 2018-04-23 HISTORY — PX: PORTACATH PLACEMENT: SHX2246

## 2018-04-23 SURGERY — INSERTION, TUNNELED CENTRAL VENOUS DEVICE, WITH PORT
Anesthesia: Monitor Anesthesia Care | Laterality: Right

## 2018-04-23 MED ORDER — LACTATED RINGERS IV SOLN: INTRAVENOUS | Status: DC

## 2018-04-23 MED ORDER — PROPOFOL 500 MG/50ML IV EMUL
INTRAVENOUS | Status: DC | PRN
  Administered 2018-04-23: 35 ug/kg/min via INTRAVENOUS

## 2018-04-23 MED ORDER — PROMETHAZINE HCL 25 MG/ML IJ SOLN: 6.2500 mg | INTRAMUSCULAR | Status: DC | PRN

## 2018-04-23 MED ORDER — OXYCODONE HCL 5 MG PO TABS: 5.0000 mg | ORAL_TABLET | ORAL | 0 refills | Status: DC | PRN

## 2018-04-23 MED ORDER — LACTATED RINGERS IV SOLN
INTRAVENOUS | Status: DC
  Administered 2018-04-23: 07:00:00 via INTRAVENOUS

## 2018-04-23 MED ORDER — HYDROCODONE-ACETAMINOPHEN 7.5-325 MG PO TABS: 1.0000 | ORAL_TABLET | Freq: Once | ORAL | Status: DC | PRN

## 2018-04-23 MED ORDER — CHLORHEXIDINE GLUCONATE CLOTH 2 % EX PADS: 6.0000 | MEDICATED_PAD | Freq: Once | CUTANEOUS | Status: DC

## 2018-04-23 MED ORDER — VANCOMYCIN HCL IN DEXTROSE 1-5 GM/200ML-% IV SOLN
1000.0000 mg | Freq: Once | INTRAVENOUS | Status: AC
  Administered 2018-04-23 (×2): 1000 mg via INTRAVENOUS

## 2018-04-23 MED ORDER — LIDOCAINE HCL (PF) 1 % IJ SOLN
INTRAMUSCULAR | Status: AC
  Filled 2018-04-23: qty 30

## 2018-04-23 MED ORDER — LIDOCAINE-PRILOCAINE 2.5-2.5 % EX CREA: 1.0000 "application " | TOPICAL_CREAM | CUTANEOUS | 3 refills | Status: DC | PRN

## 2018-04-23 MED ORDER — KETOROLAC TROMETHAMINE 30 MG/ML IJ SOLN
15.0000 mg | Freq: Once | INTRAMUSCULAR | Status: AC
  Administered 2018-04-23: 15 mg via INTRAVENOUS

## 2018-04-23 MED ORDER — MIDAZOLAM HCL 2 MG/2ML IJ SOLN
INTRAMUSCULAR | Status: AC
  Filled 2018-04-23: qty 2

## 2018-04-23 MED ORDER — MIDAZOLAM HCL 5 MG/5ML IJ SOLN
INTRAMUSCULAR | Status: DC | PRN
  Administered 2018-04-23 (×2): 1 mg via INTRAVENOUS

## 2018-04-23 MED ORDER — HYDROMORPHONE HCL 1 MG/ML IJ SOLN: 0.2500 mg | INTRAMUSCULAR | Status: DC | PRN

## 2018-04-23 MED ORDER — PROPOFOL 10 MG/ML IV BOLUS
INTRAVENOUS | Status: DC | PRN
  Administered 2018-04-23: 15 mg via INTRAVENOUS

## 2018-04-23 MED ORDER — HEPARIN SOD (PORK) LOCK FLUSH 100 UNIT/ML IV SOLN
INTRAVENOUS | Status: AC
  Filled 2018-04-23: qty 5

## 2018-04-23 MED ORDER — LIDOCAINE HCL (PF) 1 % IJ SOLN
INTRAMUSCULAR | Status: DC | PRN
  Administered 2018-04-23: 7 mL

## 2018-04-23 MED ORDER — VANCOMYCIN HCL IN DEXTROSE 1-5 GM/200ML-% IV SOLN
INTRAVENOUS | Status: AC
  Filled 2018-04-23: qty 200

## 2018-04-23 MED ORDER — SODIUM CHLORIDE (PF) 0.9 % IJ SOLN
INTRAMUSCULAR | Status: DC | PRN
  Administered 2018-04-23: 5 mL

## 2018-04-23 MED ORDER — HEPARIN SOD (PORK) LOCK FLUSH 100 UNIT/ML IV SOLN
INTRAVENOUS | Status: DC | PRN
  Administered 2018-04-23: 500 [IU]

## 2018-04-23 MED ORDER — MEPERIDINE HCL 50 MG/ML IJ SOLN: 6.2500 mg | INTRAMUSCULAR | Status: DC | PRN

## 2018-04-23 MED ORDER — KETOROLAC TROMETHAMINE 30 MG/ML IJ SOLN
INTRAMUSCULAR | Status: AC
  Filled 2018-04-23: qty 1

## 2018-04-23 MED ORDER — PROPOFOL 10 MG/ML IV BOLUS
INTRAVENOUS | Status: AC
  Filled 2018-04-23: qty 20

## 2018-04-23 MED ORDER — CEFAZOLIN SODIUM-DEXTROSE 2-4 GM/100ML-% IV SOLN
INTRAVENOUS | Status: AC
  Filled 2018-04-23: qty 100

## 2018-04-23 SURGICAL SUPPLY — 27 items
BAG DECANTER FOR FLEXI CONT (MISCELLANEOUS) ×2 IMPLANT
CHLORAPREP W/TINT 10.5 ML (MISCELLANEOUS) ×2 IMPLANT
CLOTH BEACON ORANGE TIMEOUT ST (SAFETY) ×2 IMPLANT
COVER LIGHT HANDLE STERIS (MISCELLANEOUS) ×4 IMPLANT
DECANTER SPIKE VIAL GLASS SM (MISCELLANEOUS) ×2 IMPLANT
DERMABOND ADVANCED (GAUZE/BANDAGES/DRESSINGS) ×1
DERMABOND ADVANCED .7 DNX12 (GAUZE/BANDAGES/DRESSINGS) ×1 IMPLANT
DRAPE C-ARM FOLDED MOBILE STRL (DRAPES) ×2 IMPLANT
ELECT REM PT RETURN 9FT ADLT (ELECTROSURGICAL) ×2
ELECTRODE REM PT RTRN 9FT ADLT (ELECTROSURGICAL) ×1 IMPLANT
GLOVE BIOGEL PI IND STRL 7.0 (GLOVE) ×2 IMPLANT
GLOVE BIOGEL PI INDICATOR 7.0 (GLOVE) ×2
GLOVE SURG SS PI 7.5 STRL IVOR (GLOVE) ×2 IMPLANT
GOWN STRL REUS W/TWL LRG LVL3 (GOWN DISPOSABLE) ×4 IMPLANT
IV NS 500ML (IV SOLUTION) ×1
IV NS 500ML BAXH (IV SOLUTION) ×1 IMPLANT
KIT PORT POWER 8FR ISP MRI (Port) ×2 IMPLANT
KIT TURNOVER KIT A (KITS) ×2 IMPLANT
NEEDLE HYPO 25X1 1.5 SAFETY (NEEDLE) ×2 IMPLANT
PACK MINOR (CUSTOM PROCEDURE TRAY) ×2 IMPLANT
PAD ARMBOARD 7.5X6 YLW CONV (MISCELLANEOUS) ×2 IMPLANT
SET BASIN LINEN APH (SET/KITS/TRAYS/PACK) ×2 IMPLANT
SUT MNCRL AB 4-0 PS2 18 (SUTURE) ×2 IMPLANT
SUT VIC AB 3-0 SH 27 (SUTURE) ×1
SUT VIC AB 3-0 SH 27X BRD (SUTURE) ×1 IMPLANT
SYR 5ML LL (SYRINGE) ×2 IMPLANT
SYR CONTROL 10ML LL (SYRINGE) ×2 IMPLANT

## 2018-04-23 NOTE — Interval H&P Note (Signed)
History and Physical Interval Note:  04/23/2018 7:17 AM  Bradley [name]  has presented today for surgery, with the diagnosis of left lung cancer.  The various methods of treatment have been discussed with the patient and family. After consideration of risks, benefits and other options for treatment, the patient has consented to  Procedure(s): INSERTION PORT-A-CATH (Right) as a surgical intervention.  The patient's history has been reviewed, patient examined, no change in status, stable for surgery.  I have reviewed the patient's chart and labs.  Questions were answered to the patient's satisfaction.     Aviva Signs

## 2018-04-23 NOTE — Transfer of Care (Signed)
Immediate Anesthesia Transfer of Care Note  Patient: Bradley [name]  Procedure(s) Performed: INSERTION PORT-A-CATH (Right )  Patient Location: PACU  Anesthesia Type:MAC  Level of Consciousness: awake and patient cooperative  Airway & Oxygen Therapy: Patient Spontanous Breathing and Patient connected to nasal cannula oxygen  Post-op Assessment: Report given to RN, Post -op Vital signs reviewed and stable and Patient moving all extremities  Post vital signs: Reviewed and stable  Last Vitals:  Vitals Value Taken Time  BP    Temp    Pulse 69 04/23/2018  8:03 AM  Resp 20 04/23/2018  8:03 AM  SpO2 97 % 04/23/2018  8:03 AM  Vitals shown include unvalidated device data.  Last Pain:  Vitals:   04/23/18 0637  TempSrc: Oral  PainSc: 6       Patients Stated Pain Goal: 5 (76/72/09 4709)  Complications: No apparent anesthesia complications

## 2018-04-23 NOTE — Anesthesia Postprocedure Evaluation (Signed)
Anesthesia Post Note  Patient: Bradley [name]  Procedure(s) Performed: INSERTION PORT-A-CATH (Right )  Patient location during evaluation: PACU Anesthesia Type: MAC Level of consciousness: awake and alert and patient cooperative Pain management: pain level controlled Vital Signs Assessment: post-procedure vital signs reviewed and stable Respiratory status: spontaneous breathing, nonlabored ventilation and respiratory function stable Cardiovascular status: blood pressure returned to baseline Postop Assessment: no apparent nausea or vomiting Anesthetic complications: no     Last Vitals:  Vitals:   04/23/18 0805 04/23/18 0829  BP: (!) 143/70 (!) 145/76  Pulse: 70 67  Resp: 20 20  Temp: 36.6 C 36.6 C  SpO2: 95% 97%    Last Pain:  Vitals:   04/23/18 0829  TempSrc: Oral  PainSc: 0-No pain                 Sheretta Grumbine J

## 2018-04-23 NOTE — Anesthesia Preprocedure Evaluation (Signed)
Anesthesia Evaluation    Airway Mallampati: II       Dental  (+) Edentulous Upper, Edentulous Lower   Pulmonary shortness of breath and with exertion, pneumonia, COPD,  COPD inhaler, Current Smoker,    + rhonchi  + decreased breath sounds      Cardiovascular hypertension, + CAD and + Past MI   Rhythm:regular     Neuro/Psych  Headaches,  Neuromuscular disease    GI/Hepatic   Endo/Other    Renal/GU      Musculoskeletal   Abdominal   Peds  Hematology   Anesthesia Other Findings Non-small cell lung CA, b/l Dyspnea with exertion, ambulates with cane MI hx 1992, 1994 Former h/o ETOH abuse, stopped 1996 congoing tob abuse 1 ppd  Reproductive/Obstetrics                             Anesthesia Physical Anesthesia Plan  ASA: IV  Anesthesia Plan: MAC   Post-op Pain Management:    Induction:   PONV Risk Score and Plan:   Airway Management Planned:   Additional Equipment:   Intra-op Plan:   Post-operative Plan:   Informed Consent: I have reviewed the patients History and Physical, chart, labs and discussed the procedure including the risks, benefits and alternatives for the proposed anesthesia with the patient or authorized representative who has indicated his/her understanding and acceptance.       Plan Discussed with: Anesthesiologist  Anesthesia Plan Comments:         Anesthesia Quick Evaluation

## 2018-04-23 NOTE — H&P (Signed)
Bradley [name] is an 78 y.o. male.   Chief Complaint: Left lung carcinoma HPI: Patient is a 78 year old white male diagnosed with non-small cell left lung carcinoma who presents for Port-A-Cath.  He is about to undergo chemotherapy and needs central venous access.  Patient denies any pain.  Past Medical History:  Diagnosis Date  . Cancer ([facility])    lung  . Chronic lower back pain   . Chronic pain    pain management  . Contact with stonefish as cause of accidental injury 1954   "stung across my left hand in [place]; LUE weaker since"  . COPD (chronic obstructive pulmonary disease) ([facility])   . Coronary artery disease   . Dyspnea    with exertion  . High cholesterol   . History of blood transfusion 1943  . History of kidney stones    passed  . Hypertension   . Mass of left lung   . Migraine    "none since the 1990s" (09/11/2016)  . Myocardial infarction ([facility]) 01/20/1993; 02/28/1993  . Neuromuscular disorder ([facility])    neuropathy left  . Pneumonia    "several times" (09/11/2016)  . Tobacco abuse     Past Surgical History:  Procedure Laterality Date  . APPENDECTOMY    . CARDIAC CATHETERIZATION    . CATARACT EXTRACTION, BILATERAL Bilateral   . COLONOSCOPY    . HIP ARTHROPLASTY Right 12/20/2016   Procedure: ARTHROPLASTY BIPOLAR HIP (HEMIARTHROPLASTY);  Surgeon: [name], MD;  Location: [facility];  Service: Orthopedics;  Laterality: Right;  . JOINT REPLACEMENT    . LEFT HEART CATH AND CORONARY ANGIOGRAPHY N/A 09/12/2016   Procedure: LEFT HEART CATH AND CORONARY ANGIOGRAPHY;  Surgeon: Burnell Blanks, MD;  Location: [facility];  Service: Cardiovascular;  Laterality: N/A;  . NASAL ENDOSCOPY WITH EPISTAXIS CONTROL Right 03/15/2016   Procedure: NASAL ENDOSCOPY WITH EPISTAXIS CONTROL;  Surgeon: Jodi Marble, MD;  Location: [facility];  Service: ENT;  Laterality: Right;  . SEPTOPLASTY Right 03/15/2016   Procedure: SEPTOPLASTY;  Surgeon: Jodi Marble, MD;  Location: [facility];  Service:  ENT;  Laterality: Right;  . SINUS EXPLORATION Right 03/15/2016   Procedure: SINUS EXPLORATION;  Surgeon: Jodi Marble, MD;  Location: [facility];  Service: ENT;  Laterality: Right;  . TONSILLECTOMY    . TOOTH EXTRACTION    . TOTAL KNEE ARTHROPLASTY Left   . VIDEO BRONCHOSCOPY WITH ENDOBRONCHIAL ULTRASOUND N/A 12/15/2017   Procedure: VIDEO BRONCHOSCOPY WITH ENDOBRONCHIAL ULTRASOUND;  Surgeon: Rigoberto Noel, MD;  Location: [facility];  Service: Thoracic;  Laterality: N/A;    Family History  Adopted: Yes  Problem Relation Age of Onset  . Cancer Mother   . Obesity Father    Social History:  reports that he has been smoking cigarettes. He has a 28.00 pack-year smoking history. He has never used smokeless tobacco. He reports previous drug use. He reports that he does not drink alcohol.  Allergies:  Allergies  Allergen Reactions  . Aspirin     Regular asa     Medications Prior to Admission  Medication Sig Dispense Refill  . ADVAIR HFA 115-21 MCG/ACT inhaler Inhale 2 puffs into the lungs every 12 (twelve) hours.  4  . amLODipine (NORVASC) 5 MG tablet TAKE 1 TABLET BY MOUTH EVERY DAY (Patient taking differently: Take 5 mg by mouth daily. ) 90 tablet 1  . Ascorbic Acid (VITAMIN C) 500 MG CAPS Take 500 mg by mouth daily.    [name] aspirin EC 81 MG  tablet Take 81 mg by mouth daily.     . celecoxib (CELEBREX) 200 MG capsule Take 1 capsule (200 mg total) by mouth daily. 30 capsule 1  . cyclobenzaprine (FLEXERIL) 5 MG tablet Take 5 mg by mouth 2 (two) times daily.     . Ferrous Sulfate (IRON) 325 (65 Fe) MG TABS Take 1 tablet by mouth daily.    . fluticasone (FLONASE) 50 MCG/ACT nasal spray Place 2 sprays into both nostrils daily.  6  . gabapentin (NEURONTIN) 300 MG capsule Take 1 capsule by mouth 3 (three) times daily.  2  . Ipilimumab (YERVOY IV) Inject into the vein every 6 (six) weeks.    [name] ipratropium-albuterol (DUONEB) 0.5-2.5 (3) MG/3ML SOLN Take 3 mLs by nebulization 3 (three) times daily. 360 mL    . lisinopril (PRINIVIL,ZESTRIL) 20 MG tablet Take 0.5 tablets (10 mg total) by mouth daily. (Patient taking differently: Take 20 mg by mouth daily. ) 30 tablet 0  . metoCLOPramide (REGLAN) 5 MG tablet Take 1 tablet by mouth 4 (four) times daily -  before meals and at bedtime.  5  . metoprolol succinate (TOPROL-XL) 25 MG 24 hr tablet Take 25 mg by mouth daily.    . mirabegron ER (MYRBETRIQ) 25 MG TB24 tablet Take 25 mg by mouth at bedtime.     . Multiple Vitamins-Minerals (MULTIVITAMIN WITH MINERALS) tablet Take 1 tablet by mouth daily.    . nitroGLYCERIN (NITROSTAT) 0.4 MG SL tablet Place 0.4 mg under the tongue every 5 (five) minutes as needed for chest pain.    . Nivolumab (OPDIVO IV) Inject into the vein every 14 (fourteen) days.    [name] omeprazole (PRILOSEC) 40 MG capsule Take 40 mg by mouth daily.     [name] oxyCODONE (OXY IR/ROXICODONE) 5 MG immediate release tablet Take 1 tablet (5 mg total) by mouth every 4 (four) hours as needed for severe pain. 12 tablet 0  . potassium gluconate (HM POTASSIUM) 595 (99 K) MG TABS tablet Take 595 mg by mouth daily.    . simvastatin (ZOCOR) 10 MG tablet Take 10 mg by mouth at bedtime.   3  . VENTOLIN HFA 108 (90 Base) MCG/ACT inhaler Inhale 2 puffs into the lungs every 4 (four) hours as needed for wheezing or shortness of breath.   3  . buPROPion (WELLBUTRIN SR) 150 MG 12 hr tablet Take 1 tablet (150 mg total) by mouth 2 (two) times daily. Take 1 tablet daily for 1 week, stop smoking, then take twice daily after first week. (Patient not taking: Reported on 03/17/2018) 60 tablet 0    No results found for this or any previous visit (from the past 48 hour(s)). No results found.  Review of Systems  Constitutional: Positive for malaise/fatigue.  HENT: Negative.   Eyes: Negative.   Respiratory: Positive for cough.   Cardiovascular: Negative.   Gastrointestinal: Negative.   Genitourinary: Negative.   Musculoskeletal: Negative.   Skin: Negative.   Neurological:  Negative.   Endo/Heme/Allergies: Negative.   Psychiatric/Behavioral: Negative.     Blood pressure (!) 136/93, pulse 71, temperature 98 F (36.7 C), temperature source Oral, resp. rate (!) 22, height 6' (1.829 m), weight 68 kg, SpO2 98 %. Physical Exam  Vitals reviewed. Constitutional: He is oriented to person, place, and time. He appears well-developed and well-nourished. No distress.  HENT:  Head: Normocephalic and atraumatic.  Cardiovascular: Normal rate, regular rhythm and normal heart sounds. Exam reveals no gallop and no friction rub.  No murmur  heard. Respiratory: Effort normal and breath sounds normal. No respiratory distress. He has no wheezes. He has no rales.    Occasional rhonchi.  Neurological: He is alert and oriented to person, place, and time.  Skin: Skin is warm and dry.    Dr. Tomie China notes reviewed Assessment/Plan Impression: Non-small cell left lung carcinoma Plan: Patient will undergo Port-A-Cath insertion today.  The risks and benefits of the procedure including bleeding, infection, and pneumothorax were fully explained to the patient, who gave informed consent.  Aviva Signs, MD 04/23/2018, 7:15 AM

## 2018-04-23 NOTE — Op Note (Signed)
Patient:  Bradley [name]  DOB:  08-Oct-1940  MRN:  [id number]   Preop Diagnosis: Non-small cell carcinoma of left lung  Postop Diagnosis: Same  Procedure: Port-A-Cath insertion  Surgeon: Aviva Signs, MD  Anes: MAC  Indications: Patient is a 78 year old white male who presents for Port-A-Cath insertion.  He is undergoing chemotherapy for non-small cell carcinoma of the left lung.  The risks and benefits of the procedure including bleeding, infection, and pneumothorax were fully explained to the patient, who gave informed consent.  Procedure note: The patient was placed in the Trendelenburg position after the right upper chest was prepped and draped using the usual sterile technique with ChloraPrep.  Surgical site confirmation was performed.  1% Xylocaine was used for local anesthesia.  An incision was made below the right clavicle.  A subcutaneous pocket was formed.  The needle was advanced into the right subclavian vein using the Seldinger technique without difficulty.  The guidewire was then advanced into the right atrium under fluoroscopic guidance.  An introducer and peel-away sheath were placed over the guidewire.  The catheter was inserted through the peel-away sheath and the peel-away sheath was removed.  The catheter was then attached to the port and the port placed in subcutaneous pocket.  Adequate positioning was confirmed by fluoroscopy.  Good backflow of venous blood was noted on aspiration of the port.  The port was flushed with heparin flush.  The subcutaneous layer was reapproximated using a 3-0 Vicryl interrupted suture.  The skin was closed using a 4-0 Monocryl subcuticular suture.  Dermabond was applied.  All tape and needle counts were correct at the end of the procedure.  The patient was awakened and transferred to PACU in stable condition.  A chest x-ray will be performed at that time.  Complications: None  EBL: Minimal  Specimen: None

## 2018-04-23 NOTE — Discharge Instructions (Signed)
PATIENT INSTRUCTIONS POST-ANESTHESIA  IMMEDIATELY FOLLOWING SURGERY:  Do not drive or operate machinery for the first twenty four hours after surgery.  Do not make any important decisions for twenty four hours after surgery or while taking narcotic pain medications or sedatives.  If you develop intractable nausea and vomiting or a severe headache please notify your doctor immediately.  FOLLOW-UP:  Please make an appointment with your surgeon as instructed. You do not need to follow up with anesthesia unless specifically instructed to do so.  WOUND CARE INSTRUCTIONS (if applicable):  Keep a dry clean dressing on the anesthesia/puncture wound site if there is drainage.  Once the wound has quit draining you may leave it open to air.  Generally you should leave the bandage intact for twenty four hours unless there is drainage.  If the epidural site drains for more than 36-48 hours please call the anesthesia department.  QUESTIONS?:  Please feel free to call your physician or the hospital operator if you have any questions, and they will be happy to assist you.      Implanted [facility] Guide An implanted port is a device that is placed under the skin. It is usually placed in the chest. The device can be used to give IV medicine, to take blood, or for dialysis. You may have an implanted port if:  You need IV medicine that would be irritating to the small veins in your hands or arms.  You need IV medicines, such as antibiotics, for a long period of time.  You need IV nutrition for a long period of time.  You need dialysis. Having a port means that your health care provider will not need to use the veins in your arms for these procedures. You may have fewer limitations when using a port than you would if you used other types of long-term IVs, and you will likely be able to return to normal activities after your incision heals. An implanted port has two main parts:  Reservoir. The reservoir is the  part where a needle is inserted to give medicines or draw blood. The reservoir is round. After it is placed, it appears as a small, raised area under your skin.  Catheter. The catheter is a thin, flexible tube that connects the reservoir to a vein. Medicine that is inserted into the reservoir goes into the catheter and then into the vein. How is my port accessed? To access your port:  A numbing cream may be placed on the skin over the port site.  Your health care provider will put on a mask and sterile gloves.  The skin over your port will be cleaned carefully with a germ-killing soap and allowed to dry.  Your health care provider will gently pinch the port and insert a needle into it.  Your health care provider will check for a blood return to make sure the port is in the vein and is not clogged.  If your port needs to remain accessed to get medicine continuously (constant infusion), your health care provider will place a clear bandage (dressing) over the needle site. The dressing and needle will need to be changed every week, or as told by your health care provider. What is flushing? Flushing helps keep the port from getting clogged. Follow instructions from your health care provider about how and when to flush the port. Ports are usually flushed with saline solution or a medicine called heparin. The need for flushing will depend on how the port  is used:  If the port is only used from time to time to give medicines or draw blood, the port may need to be flushed: ? Before and after medicines have been given. ? Before and after blood has been drawn. ? As part of routine maintenance. Flushing may be recommended every 4-6 weeks.  If a constant infusion is running, the port may not need to be flushed.  Throw away any syringes in a disposal container that is meant for sharp items (sharps container). You can buy a sharps container from a pharmacy, or you can make one by using an empty hard  plastic bottle with a cover. How long will my port stay implanted? The port can stay in for as long as your health care provider thinks it is needed. When it is time for the port to come out, a surgery will be done to remove it. The surgery will be similar to the procedure that was done to put the port in. Follow these instructions at home:   Flush your port as told by your health care provider.  If you need an infusion over several days, follow instructions from your health care provider about how to take care of your port site. Make sure you: ? Wash your hands with soap and water before you change your dressing. If soap and water are not available, use alcohol-based hand sanitizer. ? Change your dressing as told by your health care provider. ? Place any used dressings or infusion bags into a plastic bag. Throw that bag in the trash. ? Keep the dressing that covers the needle clean and dry. Do not get it wet. ? Do not use scissors or sharp objects near the tube. ? Keep the tube clamped, unless it is being used.  Check your port site every day for signs of infection. Check for: ? Redness, swelling, or pain. ? Fluid or blood. ? Pus or a bad smell.  Protect the skin around the port site. ? Avoid wearing bra straps that rub or irritate the site. ? Protect the skin around your port from seat belts. Place a soft pad over your chest if needed.  Bathe or shower as told by your health care provider. The site may get wet as long as you are not actively receiving an infusion.  Return to your normal activities as told by your health care provider. Ask your health care provider what activities are safe for you.  Carry a medical alert card or wear a medical alert bracelet at all times. This will let health care providers know that you have an implanted port in case of an emergency. Get help right away if:  You have redness, swelling, or pain at the port site.  You have fluid or blood coming from  your port site.  You have pus or a bad smell coming from the port site.  You have a fever. Summary  Implanted ports are usually placed in the chest for long-term IV access.  Follow instructions from your health care provider about flushing the port and changing bandages (dressings).  Take care of the area around your port by avoiding clothing that puts pressure on the area, and by watching for signs of infection.  Protect the skin around your port from seat belts. Place a soft pad over your chest if needed.  Get help right away if you have a fever or you have redness, swelling, pain, drainage, or a bad smell at the port site. This  information is not intended to replace advice given to you by your health care provider. Make sure you discuss any questions you have with your health care provider. Document Released: 01/13/2005 Document Revised: 02/16/2016 Document Reviewed: 02/16/2016 Elsevier Interactive Patient Education  2019 [employer].

## 2018-04-26 ENCOUNTER — Encounter (HOSPITAL_COMMUNITY): Payer: Self-pay | Admitting: General Surgery

## 2018-04-26 ENCOUNTER — Inpatient Hospital Stay (HOSPITAL_COMMUNITY): Payer: Medicare Other

## 2018-04-26 ENCOUNTER — Other Ambulatory Visit: Payer: Self-pay

## 2018-04-26 VITALS — BP 145/67 | HR 68 | Temp 97.6°F | Resp 18 | Wt 160.8 lb

## 2018-04-26 DIAGNOSIS — Z5112 Encounter for antineoplastic immunotherapy: Secondary | ICD-10-CM | POA: Diagnosis not present

## 2018-04-26 DIAGNOSIS — C3492 Malignant neoplasm of unspecified part of left bronchus or lung: Secondary | ICD-10-CM

## 2018-04-26 DIAGNOSIS — C3432 Malignant neoplasm of lower lobe, left bronchus or lung: Secondary | ICD-10-CM | POA: Diagnosis not present

## 2018-04-26 DIAGNOSIS — Z79899 Other long term (current) drug therapy: Secondary | ICD-10-CM | POA: Diagnosis not present

## 2018-04-26 MED ORDER — DIPHENHYDRAMINE HCL 25 MG PO CAPS
25.0000 mg | ORAL_CAPSULE | Freq: Once | ORAL | Status: AC
  Administered 2018-04-26: 25 mg via ORAL
  Filled 2018-04-26: qty 1

## 2018-04-26 MED ORDER — HEPARIN SOD (PORK) LOCK FLUSH 100 UNIT/ML IV SOLN
500.0000 [IU] | Freq: Once | INTRAVENOUS | Status: AC | PRN
  Administered 2018-04-26: 500 [IU]

## 2018-04-26 MED ORDER — SODIUM CHLORIDE 0.9 % IV SOLN
3.0000 mg/kg | Freq: Once | INTRAVENOUS | Status: AC
  Administered 2018-04-26: 220 mg via INTRAVENOUS
  Filled 2018-04-26: qty 2

## 2018-04-26 MED ORDER — DENOSUMAB 120 MG/1.7ML ~~LOC~~ SOLN
SUBCUTANEOUS | Status: AC
  Filled 2018-04-26: qty 1.7

## 2018-04-26 MED ORDER — PROCHLORPERAZINE MALEATE 10 MG PO TABS
ORAL_TABLET | ORAL | Status: AC
  Filled 2018-04-26: qty 1

## 2018-04-26 MED ORDER — SODIUM CHLORIDE 0.9% FLUSH
10.0000 mL | INTRAVENOUS | Status: DC | PRN
  Administered 2018-04-26: 10 mL
  Filled 2018-04-26: qty 10

## 2018-04-26 MED ORDER — SODIUM CHLORIDE 0.9 % IV SOLN
1.0000 mg/kg | Freq: Once | INTRAVENOUS | Status: AC
  Administered 2018-04-26: 75 mg via INTRAVENOUS
  Filled 2018-04-26: qty 15

## 2018-04-26 MED ORDER — FAMOTIDINE 20 MG PO TABS
20.0000 mg | ORAL_TABLET | Freq: Once | ORAL | Status: AC
  Administered 2018-04-26: 20 mg via ORAL
  Filled 2018-04-26: qty 1

## 2018-04-26 MED ORDER — SODIUM CHLORIDE 0.9 % IV SOLN
Freq: Once | INTRAVENOUS | Status: AC
  Administered 2018-04-26: 11:00:00 via INTRAVENOUS

## 2018-04-26 NOTE — Progress Notes (Signed)
Patient to treatment area for therapy.  Reviewed side effects with written information given to carry home.  All questions asked and answered.  Port site clean and dry with liquid glue intact.  No complaints of pain at site.    Patient tolerated therapy with no complaints voiced.  Port site clean and dry with no bruising or swelling noted at site.  Good blood return noted before and after administration of therapy.  Band aid applied.  Patient left ambulatory with VSS and no s/s of distress noted.

## 2018-04-26 NOTE — Patient Instructions (Signed)
[facility] Discharge Instructions for Patients Receiving Chemotherapy  Today you received the following chemotherapy agents  If you develop nausea and vomiting that is not controlled by your nausea medication, call the clinic.   BELOW ARE SYMPTOMS THAT SHOULD BE REPORTED IMMEDIATELY:  *FEVER GREATER THAN 100.5 F  *CHILLS WITH OR WITHOUT FEVER  NAUSEA AND VOMITING THAT IS NOT CONTROLLED WITH YOUR NAUSEA MEDICATION  *UNUSUAL SHORTNESS OF BREATH  *UNUSUAL BRUISING OR BLEEDING  TENDERNESS IN MOUTH AND THROAT WITH OR WITHOUT PRESENCE OF ULCERS  *URINARY PROBLEMS  *BOWEL PROBLEMS  UNUSUAL RASH Items with * indicate a potential emergency and should be followed up as soon as possible.  Feel free to call the clinic should you have any questions or concerns. The clinic phone number is [phone].  Please show the CHEMO ALERT CARD at check-in to the Emergency Department and triage nurse.   

## 2018-04-27 MED ORDER — OCTREOTIDE ACETATE 30 MG IM KIT
PACK | INTRAMUSCULAR | Status: AC
  Filled 2018-04-27: qty 1

## 2018-04-27 MED ORDER — LEUPROLIDE ACETATE (3 MONTH) 22.5 MG IM KIT
PACK | INTRAMUSCULAR | Status: AC
  Filled 2018-04-27: qty 22.5

## 2018-04-29 ENCOUNTER — Telehealth (HOSPITAL_COMMUNITY): Payer: Self-pay

## 2018-04-29 NOTE — Telephone Encounter (Signed)
Chemotherapy follow up phone call.  No answer and unable to leave a message.

## 2018-05-03 ENCOUNTER — Telehealth (HOSPITAL_COMMUNITY): Payer: Self-pay | Admitting: *Deleted

## 2018-05-04 DIAGNOSIS — M545 Low back pain: Secondary | ICD-10-CM | POA: Diagnosis not present

## 2018-05-04 DIAGNOSIS — J449 Chronic obstructive pulmonary disease, unspecified: Secondary | ICD-10-CM | POA: Diagnosis not present

## 2018-05-04 DIAGNOSIS — R0602 Shortness of breath: Secondary | ICD-10-CM | POA: Diagnosis not present

## 2018-05-04 DIAGNOSIS — Z79891 Long term (current) use of opiate analgesic: Secondary | ICD-10-CM | POA: Diagnosis not present

## 2018-05-04 DIAGNOSIS — I10 Essential (primary) hypertension: Secondary | ICD-10-CM | POA: Diagnosis not present

## 2018-05-04 DIAGNOSIS — Z79899 Other long term (current) drug therapy: Secondary | ICD-10-CM | POA: Diagnosis not present

## 2018-05-04 DIAGNOSIS — C3490 Malignant neoplasm of unspecified part of unspecified bronchus or lung: Secondary | ICD-10-CM | POA: Diagnosis not present

## 2018-05-10 ENCOUNTER — Encounter (HOSPITAL_COMMUNITY): Payer: Self-pay

## 2018-05-10 ENCOUNTER — Other Ambulatory Visit (HOSPITAL_COMMUNITY): Payer: Medicare Other

## 2018-05-10 ENCOUNTER — Inpatient Hospital Stay (HOSPITAL_COMMUNITY): Payer: Medicare Other

## 2018-05-10 ENCOUNTER — Ambulatory Visit (HOSPITAL_COMMUNITY): Payer: Medicare Other

## 2018-05-10 ENCOUNTER — Other Ambulatory Visit: Payer: Self-pay

## 2018-05-10 ENCOUNTER — Inpatient Hospital Stay (HOSPITAL_COMMUNITY): Payer: Medicare Other | Attending: Hematology

## 2018-05-10 VITALS — BP 125/64 | HR 78 | Temp 97.9°F | Resp 16 | Wt 160.8 lb

## 2018-05-10 DIAGNOSIS — Z5112 Encounter for antineoplastic immunotherapy: Secondary | ICD-10-CM | POA: Diagnosis not present

## 2018-05-10 DIAGNOSIS — C3432 Malignant neoplasm of lower lobe, left bronchus or lung: Secondary | ICD-10-CM | POA: Diagnosis not present

## 2018-05-10 DIAGNOSIS — C3492 Malignant neoplasm of unspecified part of left bronchus or lung: Secondary | ICD-10-CM

## 2018-05-10 LAB — CBC WITH DIFFERENTIAL/PLATELET
Abs Immature Granulocytes: 0.21 10*3/uL — ABNORMAL HIGH (ref 0.00–0.07)
Basophils Absolute: 0.2 10*3/uL — ABNORMAL HIGH (ref 0.0–0.1)
Basophils Relative: 1 %
Eosinophils Absolute: 0.6 10*3/uL — ABNORMAL HIGH (ref 0.0–0.5)
Eosinophils Relative: 3 %
HCT: 40.2 % (ref 39.0–52.0)
Hemoglobin: 12.9 g/dL — ABNORMAL LOW (ref 13.0–17.0)
Immature Granulocytes: 1 %
Lymphocytes Relative: 15 %
Lymphs Abs: 2.7 10*3/uL (ref 0.7–4.0)
MCH: 29 pg (ref 26.0–34.0)
MCHC: 32.1 g/dL (ref 30.0–36.0)
MCV: 90.3 fL (ref 80.0–100.0)
Monocytes Absolute: 1.2 10*3/uL — ABNORMAL HIGH (ref 0.1–1.0)
Monocytes Relative: 7 %
Neutro Abs: 13.1 10*3/uL — ABNORMAL HIGH (ref 1.7–7.7)
Neutrophils Relative %: 73 %
Platelets: 732 10*3/uL — ABNORMAL HIGH (ref 150–400)
RBC: 4.45 MIL/uL (ref 4.22–5.81)
RDW: 15.6 % — ABNORMAL HIGH (ref 11.5–15.5)
WBC: 17.9 10*3/uL — ABNORMAL HIGH (ref 4.0–10.5)
nRBC: 0 % (ref 0.0–0.2)

## 2018-05-10 LAB — COMPREHENSIVE METABOLIC PANEL
ALT: 14 U/L (ref 0–44)
AST: 19 U/L (ref 15–41)
Albumin: 2.6 g/dL — ABNORMAL LOW (ref 3.5–5.0)
Alkaline Phosphatase: 45 U/L (ref 38–126)
Anion gap: 9 (ref 5–15)
BUN: 22 mg/dL (ref 8–23)
CO2: 21 mmol/L — ABNORMAL LOW (ref 22–32)
Calcium: 8.2 mg/dL — ABNORMAL LOW (ref 8.9–10.3)
Chloride: 106 mmol/L (ref 98–111)
Creatinine, Ser: 1.09 mg/dL (ref 0.61–1.24)
GFR calc Af Amer: >60 mL/min (ref >60)
GFR calc non Af Amer: >60 mL/min (ref >60)
Glucose, Bld: 98 mg/dL (ref 70–99)
Potassium: 4.1 mmol/L (ref 3.5–5.1)
Sodium: 136 mmol/L (ref 135–145)
Total Bilirubin: 1 mg/dL (ref 0.3–1.2)
Total Protein: 7.7 g/dL (ref 6.5–8.1)

## 2018-05-10 MED ORDER — SODIUM CHLORIDE 0.9 % IV SOLN
3.0000 mg/kg | Freq: Once | INTRAVENOUS | Status: AC
  Administered 2018-05-10: 220 mg via INTRAVENOUS
  Filled 2018-05-10: qty 12

## 2018-05-10 MED ORDER — HEPARIN SOD (PORK) LOCK FLUSH 100 UNIT/ML IV SOLN
500.0000 [IU] | Freq: Once | INTRAVENOUS | Status: AC | PRN
  Administered 2018-05-10: 500 [IU]

## 2018-05-10 MED ORDER — SODIUM CHLORIDE 0.9 % IV SOLN
Freq: Once | INTRAVENOUS | Status: AC
  Administered 2018-05-10: 11:00:00 via INTRAVENOUS

## 2018-05-10 MED ORDER — SODIUM CHLORIDE 0.9% FLUSH
10.0000 mL | INTRAVENOUS | Status: DC | PRN
  Administered 2018-05-10: 10 mL
  Filled 2018-05-10: qty 10

## 2018-05-10 NOTE — Telephone Encounter (Signed)
Patient is in the office today and will be evaluated.

## 2018-05-10 NOTE — Patient Instructions (Signed)
[facility] Discharge Instructions for Patients Receiving Chemotherapy  Today you received the following chemotherapy agents   To help prevent nausea and vomiting after your treatment, we encourage you to take your nausea medication   If you develop nausea and vomiting that is not controlled by your nausea medication, call the clinic.   BELOW ARE SYMPTOMS THAT SHOULD BE REPORTED IMMEDIATELY:  *FEVER GREATER THAN 100.5 F  *CHILLS WITH OR WITHOUT FEVER  NAUSEA AND VOMITING THAT IS NOT CONTROLLED WITH YOUR NAUSEA MEDICATION  *UNUSUAL SHORTNESS OF BREATH  *UNUSUAL BRUISING OR BLEEDING  TENDERNESS IN MOUTH AND THROAT WITH OR WITHOUT PRESENCE OF ULCERS  *URINARY PROBLEMS  *BOWEL PROBLEMS  UNUSUAL RASH Items with * indicate a potential emergency and should be followed up as soon as possible.  Feel free to call the clinic should you have any questions or concerns. The clinic phone number is [phone].  Please show the CHEMO ALERT CARD at check-in to the Emergency Department and triage nurse.   

## 2018-05-10 NOTE — Progress Notes (Signed)
Labs reviewed with MD. Proceed with treatment today.  Treatment given per orders. Patient tolerated it well without problems. Vitals stable and discharged home from clinic ambulatory. Follow up as scheduled.

## 2018-05-11 DIAGNOSIS — S72009A Fracture of unspecified part of neck of unspecified femur, initial encounter for closed fracture: Secondary | ICD-10-CM | POA: Diagnosis not present

## 2018-05-11 DIAGNOSIS — R0602 Shortness of breath: Secondary | ICD-10-CM | POA: Diagnosis not present

## 2018-05-11 DIAGNOSIS — J438 Other emphysema: Secondary | ICD-10-CM | POA: Diagnosis not present

## 2018-05-11 DIAGNOSIS — J189 Pneumonia, unspecified organism: Secondary | ICD-10-CM | POA: Diagnosis not present

## 2018-05-13 ENCOUNTER — Other Ambulatory Visit (HOSPITAL_COMMUNITY): Payer: Medicare Other

## 2018-05-13 ENCOUNTER — Ambulatory Visit (HOSPITAL_COMMUNITY): Payer: Medicare Other | Admitting: Hematology

## 2018-05-13 ENCOUNTER — Ambulatory Visit (HOSPITAL_COMMUNITY): Payer: Medicare Other

## 2018-05-17 DIAGNOSIS — J189 Pneumonia, unspecified organism: Secondary | ICD-10-CM | POA: Diagnosis not present

## 2018-05-17 DIAGNOSIS — S72009A Fracture of unspecified part of neck of unspecified femur, initial encounter for closed fracture: Secondary | ICD-10-CM | POA: Diagnosis not present

## 2018-05-17 DIAGNOSIS — J438 Other emphysema: Secondary | ICD-10-CM | POA: Diagnosis not present

## 2018-05-24 ENCOUNTER — Other Ambulatory Visit: Payer: Self-pay

## 2018-05-24 ENCOUNTER — Inpatient Hospital Stay (HOSPITAL_BASED_OUTPATIENT_CLINIC_OR_DEPARTMENT_OTHER): Payer: Medicare Other | Admitting: Hematology

## 2018-05-24 ENCOUNTER — Inpatient Hospital Stay (HOSPITAL_COMMUNITY): Payer: Medicare Other

## 2018-05-24 ENCOUNTER — Encounter (HOSPITAL_COMMUNITY): Payer: Self-pay | Admitting: Hematology

## 2018-05-24 VITALS — BP 117/65 | HR 77 | Temp 97.6°F | Resp 20 | Wt 156.1 lb

## 2018-05-24 DIAGNOSIS — Z5112 Encounter for antineoplastic immunotherapy: Secondary | ICD-10-CM | POA: Diagnosis not present

## 2018-05-24 DIAGNOSIS — C3492 Malignant neoplasm of unspecified part of left bronchus or lung: Secondary | ICD-10-CM

## 2018-05-24 DIAGNOSIS — C3432 Malignant neoplasm of lower lobe, left bronchus or lung: Secondary | ICD-10-CM

## 2018-05-24 LAB — CBC WITH DIFFERENTIAL/PLATELET
Abs Immature Granulocytes: 0.09 10*3/uL — ABNORMAL HIGH (ref 0.00–0.07)
Basophils Absolute: 0.2 10*3/uL — ABNORMAL HIGH (ref 0.0–0.1)
Basophils Relative: 1 %
Eosinophils Absolute: 0.5 10*3/uL (ref 0.0–0.5)
Eosinophils Relative: 3 %
HCT: 44.6 % (ref 39.0–52.0)
Hemoglobin: 14.2 g/dL (ref 13.0–17.0)
Immature Granulocytes: 1 %
Lymphocytes Relative: 24 %
Lymphs Abs: 3.4 10*3/uL (ref 0.7–4.0)
MCH: 28.8 pg (ref 26.0–34.0)
MCHC: 31.8 g/dL (ref 30.0–36.0)
MCV: 90.5 fL (ref 80.0–100.0)
Monocytes Absolute: 1.1 10*3/uL — ABNORMAL HIGH (ref 0.1–1.0)
Monocytes Relative: 8 %
Neutro Abs: 8.6 10*3/uL — ABNORMAL HIGH (ref 1.7–7.7)
Neutrophils Relative %: 63 %
Platelets: 572 10*3/uL — ABNORMAL HIGH (ref 150–400)
RBC: 4.93 MIL/uL (ref 4.22–5.81)
RDW: 16.3 % — ABNORMAL HIGH (ref 11.5–15.5)
WBC: 13.7 10*3/uL — ABNORMAL HIGH (ref 4.0–10.5)
nRBC: 0 % (ref 0.0–0.2)

## 2018-05-24 LAB — COMPREHENSIVE METABOLIC PANEL
ALT: 7 U/L (ref 0–44)
AST: 13 U/L — ABNORMAL LOW (ref 15–41)
Albumin: 2.7 g/dL — ABNORMAL LOW (ref 3.5–5.0)
Alkaline Phosphatase: 40 U/L (ref 38–126)
Anion gap: 11 (ref 5–15)
BUN: 22 mg/dL (ref 8–23)
CO2: 23 mmol/L (ref 22–32)
Calcium: 8.7 mg/dL — ABNORMAL LOW (ref 8.9–10.3)
Chloride: 105 mmol/L (ref 98–111)
Creatinine, Ser: 1.06 mg/dL (ref 0.61–1.24)
GFR calc Af Amer: >60 mL/min (ref >60)
GFR calc non Af Amer: >60 mL/min (ref >60)
Glucose, Bld: 100 mg/dL — ABNORMAL HIGH (ref 70–99)
Potassium: 4.2 mmol/L (ref 3.5–5.1)
Sodium: 139 mmol/L (ref 135–145)
Total Bilirubin: 0.5 mg/dL (ref 0.3–1.2)
Total Protein: 8 g/dL (ref 6.5–8.1)

## 2018-05-24 MED ORDER — HEPARIN SOD (PORK) LOCK FLUSH 100 UNIT/ML IV SOLN
500.0000 [IU] | Freq: Once | INTRAVENOUS | Status: AC | PRN
  Administered 2018-05-24: 12:00:00 500 [IU]

## 2018-05-24 MED ORDER — FULVESTRANT 250 MG/5ML IM SOLN
INTRAMUSCULAR | Status: AC
  Filled 2018-05-24: qty 5

## 2018-05-24 MED ORDER — SODIUM CHLORIDE 0.9 % IV SOLN
Freq: Once | INTRAVENOUS | Status: AC
  Administered 2018-05-24: 11:00:00 via INTRAVENOUS

## 2018-05-24 MED ORDER — DENOSUMAB 120 MG/1.7ML ~~LOC~~ SOLN
SUBCUTANEOUS | Status: AC
  Filled 2018-05-24: qty 1.7

## 2018-05-24 MED ORDER — SODIUM CHLORIDE 0.9 % IV SOLN
3.0000 mg/kg | Freq: Once | INTRAVENOUS | Status: AC
  Administered 2018-05-24: 220 mg via INTRAVENOUS
  Filled 2018-05-24: qty 12

## 2018-05-24 MED ORDER — SODIUM CHLORIDE 0.9% FLUSH
10.0000 mL | INTRAVENOUS | Status: DC | PRN
  Administered 2018-05-24: 10 mL
  Filled 2018-05-24: qty 10

## 2018-05-24 NOTE — Patient Instructions (Addendum)
[facility] at [facility] Discharge Instructions  You were seen today by Dr. Katragadda. He went over your recent lab results. He will see you back in 2 weeks for labs and follow up.   Thank you for choosing [facility] at [facility] to provide your oncology and hematology care.  To afford each patient quality time with our provider, please arrive at least 15 minutes before your scheduled appointment time.   If you have a lab appointment with the Cancer Center please come in thru the  Main Entrance and check in at the main information desk  You need to re-schedule your appointment should you arrive 10 or more minutes late.  We strive to give you quality time with our providers, and arriving late affects you and other patients whose appointments are after yours.  Also, if you no show three or more times for appointments you may be dismissed from the clinic at the providers discretion.     Again, thank you for choosing [facility].  Our hope is that these requests will decrease the amount of time that you wait before being seen by our physicians.       _____________________________________________________________  Should you have questions after your visit to  Cancer Center, please contact our office at [phone] between the hours of 8:00 a.m. and 4:30 p.m.  Voicemails left after 4:00 p.m. will not be returned until the following business day.  For prescription refill requests, have your pharmacy contact our office and allow 72 hours.    Cancer Center Support Programs:   > Cancer Support Group  2nd Tuesday of the month 1pm-2pm, Journey Room    

## 2018-05-24 NOTE — Assessment & Plan Note (Signed)
1.  Non-small cell lung cancer, adenocarcinoma type: -he was evaluated by me as inpatient when he was hospitalized on 12/29/2017. - Left lower lobe endobronchial biopsy on 12/15/2017 shows non-small cell lung cancer, TTF-1 positive consistent with pulmonary adenocarcinoma. - He was reluctant to consider any chemotherapy when he was inpatient. -He also developed new lesions in the lung, most likely infectious -MRI of the brain on 12/29/2017 did not show any evidence of intracranial metastasis.. -PET/CT scan on 02/16/2018 shows dominant perihilar left lung mass with mildly increased in size.  There is new hypermetabolic nodule in the left upper lobe and new hypermetabolic lesion in the dome of the liver.  Right upper lobe lung nodules have slightly decreased in size.  Similar appearance of hypermetabolic mediastinal and hilar adenopathy.  - Rebiopsy of the liver lesion on 03/17/2018 for molecular studies shows poorly differentiated carcinoma. - Foundation 1 testing shows MS-stable, TMB 20 Muts/Mb, BRAF amplification, MET amplification, MYC and KIT amplification, PDGFRA/SOX2 amplifications - PDL 1 testing could not be done because of not enough tissue. - Combination immunotherapy with ipilimumab and nivolumab started on 04/26/2018. -She received nivolumab on 05/10/2018.  He does not have any immunotherapy related side effects.  However he has noted more shortness of breath on exertion. -He is also using oxygen for the past 1 week at nighttime. -He has noticed slight pressure sensation in the left side of the chest wall of, lateral to the nipple region. -I have reviewed his blood work.  He may proceed with nivolumab today.  We will see him back in 2 weeks for follow-up and next treatment. -I plan to repeat scans in 2 to 3 months.

## 2018-05-24 NOTE — Progress Notes (Signed)
1030 Labs reviewed with and pt seen by Dr. Vickey Huger today and pt approved for chemo tx today per MD           [name] tolerated Opdivo infusion well without complaints or incident. VSS upon discharge. Pt discharged via wheelchair in satisfactory condition

## 2018-05-24 NOTE — Patient Instructions (Signed)
[facility] Discharge Instructions for Patients Receiving Chemotherapy   Beginning January 23rd 2017 lab work for the [facility] will be done in the  Main lab at [facility] on 1st floor. If you have a lab appointment with the [place] please come in thru the  Main Entrance and check in at the main information desk   Today you received the following chemotherapy agents Opdivo.Follow-up as scheduled. Call clinic for any questions or concerns  To help prevent nausea and vomiting after your treatment, we encourage you to take your nausea medication   If you develop nausea and vomiting, or diarrhea that is not controlled by your medication, call the clinic.  The clinic phone number is [phone]. Office hours are Monday-Friday 8:30am-5:00pm.  BELOW ARE SYMPTOMS THAT SHOULD BE REPORTED IMMEDIATELY:  *FEVER GREATER THAN 101.0 F  *CHILLS WITH OR WITHOUT FEVER  NAUSEA AND VOMITING THAT IS NOT CONTROLLED WITH YOUR NAUSEA MEDICATION  *UNUSUAL SHORTNESS OF BREATH  *UNUSUAL BRUISING OR BLEEDING  TENDERNESS IN MOUTH AND THROAT WITH OR WITHOUT PRESENCE OF ULCERS  *URINARY PROBLEMS  *BOWEL PROBLEMS  UNUSUAL RASH Items with * indicate a potential emergency and should be followed up as soon as possible. If you have an emergency after office hours please contact your primary care physician or go to the nearest emergency department.  Please call the clinic during office hours if you have any questions or concerns.   You may also contact the Patient Navigator at [phone] should you have any questions or need assistance in obtaining follow up care.      Resources For Cancer Patients and their Caregivers ? American Cancer Society: Can assist with transportation, wigs, general needs, runs Look Good Feel Better.        773-336-3312 ? Cancer Care: Provides financial assistance, online support groups, medication/co-pay assistance.  1-800-813-HOPE  270-346-7929) ? [facility] Assists [place] cancer patients and their families through emotional , educational and financial support.  (915)450-5248 ? [place] DSS Where to apply for food stamps, Medicaid and utility assistance. (385)072-7395 ? [facility]: Transportation to medical appointments. (640) 449-3831 ? Social Security Administration: May apply for disability if have a Stage IV cancer. 850 537 1105 607-822-5541 ? [employer], Disability and Transit Services: Assists with nutrition, care and transit needs. 623-494-3550

## 2018-05-24 NOTE — Patient Instructions (Signed)
[facility] at [facility] Discharge Instructions  Labs drawn from portacath today   Thank you for choosing  Cancer Center at [facility] to provide your oncology and hematology care.  To afford each patient quality time with our provider, please arrive at least 15 minutes before your scheduled appointment time.   If you have a lab appointment with the Cancer Center please come in thru the  Main Entrance and check in at the main information desk  You need to re-schedule your appointment should you arrive 10 or more minutes late.  We strive to give you quality time with our providers, and arriving late affects you and other patients whose appointments are after yours.  Also, if you no show three or more times for appointments you may be dismissed from the clinic at the providers discretion.     Again, thank you for choosing [facility].  Our hope is that these requests will decrease the amount of time that you wait before being seen by our physicians.       _____________________________________________________________  Should you have questions after your visit to [facility], please contact our office at [phone] between the hours of 8:00 a.m. and 4:30 p.m.  Voicemails left after 4:00 p.m. will not be returned until the following business day.  For prescription refill requests, have your pharmacy contact our office and allow 72 hours.    Cancer Center Support Programs:   > Cancer Support Group  2nd Tuesday of the month 1pm-2pm, Journey Room   

## 2018-05-24 NOTE — Progress Notes (Signed)
[name], [place] [address]   CLINIC:  Medical Oncology/Hematology  PCP:  Jani Gravel, MD [place] [address] [phone]   REASON FOR VISIT:  Follow-up for Non-small cell lung cancer, adenocarcinoma type  CURRENT THERAPY:Yervoy and Opdivo.   BRIEF ONCOLOGIC HISTORY:    Non-small cell carcinoma of left lung ([place])   12/29/2017 Initial Diagnosis    Non-small cell lung cancer ([place])    04/26/2018 -  Chemotherapy    The patient had ipilimumab (YERVOY) 75 mg in sodium chloride 0.9 % 50 mL chemo infusion, 1 mg/kg = 75 mg (100 % of original dose 1 mg/kg), Intravenous,  Once, 1 of 3 cycles Dose modification: 1 mg/kg (original dose 1 mg/kg, Cycle 1, Reason: Other (see comments), Comment: per trial) Administration: 75 mg (04/26/2018) nivolumab (OPDIVO) 220 mg in sodium chloride 0.9 % 100 mL chemo infusion, 3 mg/kg = 220 mg (100 % of original dose 3 mg/kg), Intravenous, Once, 3 of 9 cycles Dose modification: 3 mg/kg (original dose 3 mg/kg, Cycle 1, Reason: Other (see comments), Comment: Per trial) Administration: 220 mg (04/26/2018), 220 mg (05/10/2018), 220 mg (05/24/2018)  for chemotherapy treatment.       CANCER STAGING: Cancer Staging No matching staging information was found for the patient.   INTERVAL HISTORY:  Mr. Chagoya 78 y.o. male returns for routine follow-up and consideration for next cycle of chemotherapy. He is here today alone. He states that he now uses oxygen at home at night and sometimes during the day. He states that he has pain on his left side and rib area. He states that he has noticed his shortness of breath is worse than before, he gets short winded much easier than before. He states that he is eating well and appetite is good. Denies any nausea, vomiting, or diarrhea. Denies any new pains. Had not noticed any recent bleeding such as epistaxis, hematuria or hematochezia. Denies recent chest pain  on exertion, shortness of breath on minimal exertion, pre-syncopal episodes, or palpitations. Denies any numbness or tingling in hands or feet. Denies any recent fevers, infections, or recent hospitalizations. Patient reports appetite at 100% and energy level at 50%.   REVIEW OF SYSTEMS:  Review of Systems  Respiratory: Positive for shortness of breath.   Neurological: Positive for numbness.     PAST MEDICAL/SURGICAL HISTORY:  Past Medical History:  Diagnosis Date  . Cancer ([place])    lung  . Chronic lower back pain   . Chronic pain    pain management  . Contact with stonefish as cause of accidental injury 1954   "stung across my left hand in [place]; LUE weaker since"  . COPD (chronic obstructive pulmonary disease) ([place])   . Coronary artery disease   . Dyspnea    with exertion  . High cholesterol   . History of blood transfusion 1943  . History of kidney stones    passed  . Hypertension   . Mass of left lung   . Migraine    "none since the 1990s" (09/11/2016)  . Myocardial infarction ([place]) 01/20/1993; 02/28/1993  . Neuromuscular disorder ([facility])    neuropathy left  . Pneumonia    "several times" (09/11/2016)  . Tobacco abuse    Past Surgical History:  Procedure Laterality Date  . APPENDECTOMY    . CARDIAC CATHETERIZATION    . CATARACT EXTRACTION, BILATERAL Bilateral   . COLONOSCOPY    . HIP ARTHROPLASTY Right  12/20/2016   Procedure: ARTHROPLASTY BIPOLAR HIP (HEMIARTHROPLASTY);  Surgeon: [name], MD;  Location: [facility];  Service: Orthopedics;  Laterality: Right;  . JOINT REPLACEMENT    . LEFT HEART CATH AND CORONARY ANGIOGRAPHY N/A 09/12/2016   Procedure: LEFT HEART CATH AND CORONARY ANGIOGRAPHY;  Surgeon: Burnell Blanks, MD;  Location: [facility];  Service: Cardiovascular;  Laterality: N/A;  . NASAL ENDOSCOPY WITH EPISTAXIS CONTROL Right 03/15/2016   Procedure: NASAL ENDOSCOPY WITH EPISTAXIS CONTROL;  Surgeon: Jodi Marble, MD;  Location: [facility];  Service:  ENT;  Laterality: Right;  . PORTACATH PLACEMENT Right 04/23/2018   Procedure: INSERTION PORT-A-CATH;  Surgeon: Aviva Signs, MD;  Location: [facility];  Service: General;  Laterality: Right;  . SEPTOPLASTY Right 03/15/2016   Procedure: SEPTOPLASTY;  Surgeon: Jodi Marble, MD;  Location: [facility];  Service: ENT;  Laterality: Right;  . SINUS EXPLORATION Right 03/15/2016   Procedure: SINUS EXPLORATION;  Surgeon: Jodi Marble, MD;  Location: [facility];  Service: ENT;  Laterality: Right;  . TONSILLECTOMY    . TOOTH EXTRACTION    . TOTAL KNEE ARTHROPLASTY Left   . VIDEO BRONCHOSCOPY WITH ENDOBRONCHIAL ULTRASOUND N/A 12/15/2017   Procedure: VIDEO BRONCHOSCOPY WITH ENDOBRONCHIAL ULTRASOUND;  Surgeon: Rigoberto Noel, MD;  Location: [facility];  Service: Thoracic;  Laterality: N/A;     SOCIAL HISTORY:  Social History   Socioeconomic History  . Marital status: Widowed    Spouse name: Not on file  . Number of children: Not on file  . Years of education: Not on file  . Highest education level: Not on file  Occupational History  . Not on file  Social Needs  . Financial resource strain: Not on file  . Food insecurity:    Worry: Not on file    Inability: Not on file  . Transportation needs:    Medical: Not on file    Non-medical: Not on file  Tobacco Use  . Smoking status: Current Every Day Smoker    Packs/day: 0.40    Years: 70.00    Pack years: 28.00    Types: Cigarettes  . Smokeless tobacco: Never Used  . Tobacco comment: smokes 8 a day  Substance and Sexual Activity  . Alcohol use: No    Comment: 11/26/2017  "drank from the age of 8 til 03/05/1993"  . Drug use: Not Currently    Comment: 11/26/2017 "used some drugs when I was young"  . Sexual activity: Not Currently  Lifestyle  . Physical activity:    Days per week: Not on file    Minutes per session: Not on file  . Stress: Not on file  Relationships  . Social connections:    Talks on phone: Not on file    Gets together: Not on file     Attends religious service: Not on file    Active member of club or organization: Not on file    Attends meetings of clubs or organizations: Not on file    Relationship status: Not on file  . Intimate partner violence:    Fear of current or ex partner: Not on file    Emotionally abused: Not on file    Physically abused: Not on file    Forced sexual activity: Not on file  Other Topics Concern  . Not on file  Social History Narrative  . Not on file    FAMILY HISTORY:  Family History  Adopted: Yes  Problem Relation Age of Onset  .  Cancer Mother   . Obesity Father     CURRENT MEDICATIONS:  Outpatient Encounter Medications as of 05/24/2018  Medication Sig Note  . ADVAIR HFA 115-21 MCG/ACT inhaler Inhale 2 puffs into the lungs every 12 (twelve) hours.   [name] amLODipine (NORVASC) 5 MG tablet TAKE 1 TABLET BY MOUTH EVERY DAY (Patient taking differently: Take 5 mg by mouth daily. )   . Ascorbic Acid (VITAMIN C) 500 MG CAPS Take 500 mg by mouth daily.   [name] aspirin EC 81 MG tablet Take 81 mg by mouth daily.    . celecoxib (CELEBREX) 200 MG capsule Take 1 capsule (200 mg total) by mouth daily.   . cyclobenzaprine (FLEXERIL) 5 MG tablet Take 5 mg by mouth 2 (two) times daily.    . Ferrous Sulfate (IRON) 325 (65 Fe) MG TABS Take 1 tablet by mouth daily.   . fluticasone (FLONASE) 50 MCG/ACT nasal spray Place 2 sprays into both nostrils daily.   [name] gabapentin (NEURONTIN) 300 MG capsule Take 1 capsule by mouth 3 (three) times daily.   [name] Ipilimumab (YERVOY IV) Inject into the vein every 6 (six) weeks.   [name] ipratropium-albuterol (DUONEB) 0.5-2.5 (3) MG/3ML SOLN Take 3 mLs by nebulization 3 (three) times daily.   [name] lidocaine-prilocaine (EMLA) cream Apply 1 application topically as needed. Apply a small amount to port site and cover with plastic wrap 1 hour prior to infusion appointments   . lisinopril (PRINIVIL,ZESTRIL) 20 MG tablet Take 0.5 tablets (10 mg total) by mouth daily. (Patient taking  differently: Take 20 mg by mouth daily. )   . metoCLOPramide (REGLAN) 5 MG tablet Take 1 tablet by mouth 4 (four) times daily -  before meals and at bedtime.   . metoprolol succinate (TOPROL-XL) 25 MG 24 hr tablet Take 25 mg by mouth daily.   . mirabegron ER (MYRBETRIQ) 25 MG TB24 tablet Take 25 mg by mouth at bedtime.    . Multiple Vitamins-Minerals (MULTIVITAMIN WITH MINERALS) tablet Take 1 tablet by mouth daily.   . nitroGLYCERIN (NITROSTAT) 0.4 MG SL tablet Place 0.4 mg under the tongue every 5 (five) minutes as needed for chest pain.   . Nivolumab (OPDIVO IV) Inject into the vein every 14 (fourteen) days.   [name] omeprazole (PRILOSEC) 40 MG capsule Take 40 mg by mouth daily.    [name] oxyCODONE (OXY IR/ROXICODONE) 5 MG immediate release tablet Take 1 tablet (5 mg total) by mouth every 4 (four) hours as needed for severe pain. 05/24/2018: 60m per patient  . potassium gluconate (HM POTASSIUM) 595 (99 K) MG TABS tablet Take 595 mg by mouth daily.   . simvastatin (ZOCOR) 10 MG tablet Take 10 mg by mouth at bedtime.    . VENTOLIN HFA 108 (90 Base) MCG/ACT inhaler Inhale 2 puffs into the lungs every 4 (four) hours as needed for wheezing or shortness of breath.    .Marland KitchenbuPROPion (WELLBUTRIN SR) 150 MG 12 hr tablet Take 1 tablet (150 mg total) by mouth 2 (two) times daily. Take 1 tablet daily for 1 week, stop smoking, then take twice daily after first week. (Patient not taking: Reported on 03/17/2018)    No facility-administered encounter medications on file as of 05/24/2018.     ALLERGIES:  Allergies  Allergen Reactions  . Aspirin     Regular asa      PHYSICAL EXAM:  ECOG Performance status: 1  Blood pressure is 133/75.  Pulse rate is 84.  Respirate is 20.  Temperature 98.  Oxygen saturation is 90%. Physical Exam Vitals signs reviewed.  Constitutional:      Appearance: Normal appearance.  Cardiovascular:     Rate and Rhythm: Normal rate and regular rhythm.     Heart sounds: Normal heart sounds.   Pulmonary:     Effort: Pulmonary effort is normal.     Breath sounds: Normal breath sounds.  Abdominal:     General: There is no distension.     Palpations: Abdomen is soft. There is no mass.  Musculoskeletal:        General: No swelling.  Skin:    General: Skin is warm.  Neurological:     General: No focal deficit present.     Mental Status: He is alert and oriented to person, place, and time.  Psychiatric:        Mood and Affect: Mood normal.        Behavior: Behavior normal.      LABORATORY DATA:  I have reviewed the labs as listed.  CBC    Component Value Date/Time   WBC 13.7 (H) 05/24/2018 0921   RBC 4.93 05/24/2018 0921   HGB 14.2 05/24/2018 0921   HCT 44.6 05/24/2018 0921   PLT 572 (H) 05/24/2018 0921   MCV 90.5 05/24/2018 0921   MCH 28.8 05/24/2018 0921   MCHC 31.8 05/24/2018 0921   RDW 16.3 (H) 05/24/2018 0921   LYMPHSABS 3.4 05/24/2018 0921   MONOABS 1.1 (H) 05/24/2018 0921   EOSABS 0.5 05/24/2018 0921   BASOSABS 0.2 (H) 05/24/2018 0921   CMP Latest Ref Rng & Units 05/24/2018 05/10/2018 04/20/2018  Glucose 70 - 99 mg/dL 100(H) 98 98  BUN 8 - 23 mg/dL _0 Creatinine 0.61 - 1.24 mg/dL 1.06 1.09 0.84  Sodium 135 - 145 mmol/L 139 136 139  Potassium 3.5 - 5.1 mmol/L 4.2 4.1 4.4  Chloride 98 - 111 mmol/L 105 106 107  CO2 22 - 32 mmol/L 23 21(L) 24  Calcium 8.9 - 10.3 mg/dL 8.7(L) 8.2(L) 9.1  Total Protein 6.5 - 8.1 g/dL 8.0 7.7 8.6(H)  Total Bilirubin 0.3 - 1.2 mg/dL 0.5 1.0 0.6  Alkaline Phos 38 - 126 U/L 40 45 55  AST 15 - 41 U/L 13(L) 19 21  ALT 0 - 44 U/L _1 DIAGNOSTIC IMAGING:  I have independently reviewed the scans and discussed with the patient.   I have reviewed Venita Lick LPN's note and agree with the documentation.  I personally performed a face-to-face visit, made revisions and my assessment and plan is as follows.    ASSESSMENT & PLAN:   Non-small cell carcinoma of left lung ([place]) 1.  Non-small cell lung cancer,  adenocarcinoma type: -he was evaluated by me as inpatient when he was hospitalized on 12/29/2017. - Left lower lobe endobronchial biopsy on 12/15/2017 shows non-small cell lung cancer, TTF-1 positive consistent with pulmonary adenocarcinoma. - He was reluctant to consider any chemotherapy when he was inpatient. -He also developed new lesions in the lung, most likely infectious -MRI of the brain on 12/29/2017 did not show any evidence of intracranial metastasis.. -PET/CT scan on 02/16/2018 shows dominant perihilar left lung mass with mildly increased in size.  There is new hypermetabolic nodule in the left upper lobe and new hypermetabolic lesion in the dome of the liver.  Right upper lobe lung nodules have slightly decreased in size.  Similar appearance of hypermetabolic mediastinal and hilar adenopathy.  - Rebiopsy of the  liver lesion on 03/17/2018 for molecular studies shows poorly differentiated carcinoma. - Foundation 1 testing shows MS-stable, TMB 20 Muts/Mb, BRAF amplification, MET amplification, MYC and KIT amplification, PDGFRA/SOX2 amplifications - PDL 1 testing could not be done because of not enough tissue. - Combination immunotherapy with ipilimumab and nivolumab started on 04/26/2018. -She received nivolumab on 05/10/2018.  He does not have any immunotherapy related side effects.  However he has noted more shortness of breath on exertion. -He is also using oxygen for the past 1 week at nighttime. -He has noticed slight pressure sensation in the left side of the chest wall of, lateral to the nipple region. -I have reviewed his blood work.  He may proceed with nivolumab today.  We will see him back in 2 weeks for follow-up and next treatment. -I plan to repeat scans in 2 to 3 months.  Total time spent is 25 minutes with more than 50% of the time spent face-to-face discussing and reinforcing treatment plan, side effects and coordination of care.    Orders placed this encounter:  No orders of  the defined types were placed in this encounter.     Derek Jack, MD [place] [phone]

## 2018-06-01 DIAGNOSIS — I10 Essential (primary) hypertension: Secondary | ICD-10-CM | POA: Diagnosis not present

## 2018-06-01 DIAGNOSIS — M25551 Pain in right hip: Secondary | ICD-10-CM | POA: Diagnosis not present

## 2018-06-01 DIAGNOSIS — M545 Low back pain: Secondary | ICD-10-CM | POA: Diagnosis not present

## 2018-06-01 DIAGNOSIS — Z79891 Long term (current) use of opiate analgesic: Secondary | ICD-10-CM | POA: Diagnosis not present

## 2018-06-01 DIAGNOSIS — M25562 Pain in left knee: Secondary | ICD-10-CM | POA: Diagnosis not present

## 2018-06-01 DIAGNOSIS — E785 Hyperlipidemia, unspecified: Secondary | ICD-10-CM | POA: Diagnosis not present

## 2018-06-07 ENCOUNTER — Other Ambulatory Visit: Payer: Self-pay

## 2018-06-08 ENCOUNTER — Encounter (HOSPITAL_COMMUNITY): Payer: Self-pay | Admitting: Hematology

## 2018-06-08 ENCOUNTER — Encounter (HOSPITAL_COMMUNITY): Payer: Self-pay | Admitting: *Deleted

## 2018-06-08 ENCOUNTER — Inpatient Hospital Stay (HOSPITAL_COMMUNITY): Payer: Medicare Other

## 2018-06-08 ENCOUNTER — Encounter (HOSPITAL_COMMUNITY): Payer: Self-pay

## 2018-06-08 ENCOUNTER — Inpatient Hospital Stay (HOSPITAL_COMMUNITY): Payer: Medicare Other | Attending: Hematology | Admitting: Hematology

## 2018-06-08 VITALS — BP 115/60 | HR 77 | Temp 98.6°F | Resp 16

## 2018-06-08 DIAGNOSIS — C3492 Malignant neoplasm of unspecified part of left bronchus or lung: Secondary | ICD-10-CM

## 2018-06-08 DIAGNOSIS — R0789 Other chest pain: Secondary | ICD-10-CM | POA: Insufficient documentation

## 2018-06-08 DIAGNOSIS — Z5112 Encounter for antineoplastic immunotherapy: Secondary | ICD-10-CM | POA: Diagnosis not present

## 2018-06-08 DIAGNOSIS — F17210 Nicotine dependence, cigarettes, uncomplicated: Secondary | ICD-10-CM | POA: Diagnosis not present

## 2018-06-08 DIAGNOSIS — C229 Malignant neoplasm of liver, not specified as primary or secondary: Secondary | ICD-10-CM

## 2018-06-08 DIAGNOSIS — Z79899 Other long term (current) drug therapy: Secondary | ICD-10-CM | POA: Diagnosis not present

## 2018-06-08 DIAGNOSIS — C3432 Malignant neoplasm of lower lobe, left bronchus or lung: Secondary | ICD-10-CM | POA: Diagnosis not present

## 2018-06-08 DIAGNOSIS — M545 Low back pain: Secondary | ICD-10-CM | POA: Diagnosis not present

## 2018-06-08 DIAGNOSIS — G894 Chronic pain syndrome: Secondary | ICD-10-CM | POA: Diagnosis not present

## 2018-06-08 DIAGNOSIS — C3490 Malignant neoplasm of unspecified part of unspecified bronchus or lung: Secondary | ICD-10-CM | POA: Diagnosis not present

## 2018-06-08 LAB — CBC WITH DIFFERENTIAL/PLATELET
Abs Immature Granulocytes: 0.11 10*3/uL — ABNORMAL HIGH (ref 0.00–0.07)
Basophils Absolute: 0.1 10*3/uL (ref 0.0–0.1)
Basophils Relative: 1 %
Eosinophils Absolute: 1.3 10*3/uL — ABNORMAL HIGH (ref 0.0–0.5)
Eosinophils Relative: 7 %
HCT: 45.5 % (ref 39.0–52.0)
Hemoglobin: 14.4 g/dL (ref 13.0–17.0)
Immature Granulocytes: 1 %
Lymphocytes Relative: 21 %
Lymphs Abs: 3.9 10*3/uL (ref 0.7–4.0)
MCH: 29.1 pg (ref 26.0–34.0)
MCHC: 31.6 g/dL (ref 30.0–36.0)
MCV: 91.9 fL (ref 80.0–100.0)
Monocytes Absolute: 1 10*3/uL (ref 0.1–1.0)
Monocytes Relative: 6 %
Neutro Abs: 11.8 10*3/uL — ABNORMAL HIGH (ref 1.7–7.7)
Neutrophils Relative %: 64 %
Platelets: 527 10*3/uL — ABNORMAL HIGH (ref 150–400)
RBC: 4.95 MIL/uL (ref 4.22–5.81)
RDW: 17.3 % — ABNORMAL HIGH (ref 11.5–15.5)
WBC: 18.2 10*3/uL — ABNORMAL HIGH (ref 4.0–10.5)
nRBC: 0.1 % (ref 0.0–0.2)

## 2018-06-08 LAB — COMPREHENSIVE METABOLIC PANEL
ALT: 12 U/L (ref 0–44)
AST: 13 U/L — ABNORMAL LOW (ref 15–41)
Albumin: 2.8 g/dL — ABNORMAL LOW (ref 3.5–5.0)
Alkaline Phosphatase: 49 U/L (ref 38–126)
Anion gap: 9 (ref 5–15)
BUN: 20 mg/dL (ref 8–23)
CO2: 24 mmol/L (ref 22–32)
Calcium: 8.6 mg/dL — ABNORMAL LOW (ref 8.9–10.3)
Chloride: 105 mmol/L (ref 98–111)
Creatinine, Ser: 1.01 mg/dL (ref 0.61–1.24)
GFR calc Af Amer: >60 mL/min (ref >60)
GFR calc non Af Amer: >60 mL/min (ref >60)
Glucose, Bld: 103 mg/dL — ABNORMAL HIGH (ref 70–99)
Potassium: 4.1 mmol/L (ref 3.5–5.1)
Sodium: 138 mmol/L (ref 135–145)
Total Bilirubin: 0.6 mg/dL (ref 0.3–1.2)
Total Protein: 8.4 g/dL — ABNORMAL HIGH (ref 6.5–8.1)

## 2018-06-08 LAB — TSH: TSH: 1.246 u[IU]/mL (ref 0.350–4.500)

## 2018-06-08 MED ORDER — SODIUM CHLORIDE 0.9 % IV SOLN
3.0000 mg/kg | Freq: Once | INTRAVENOUS | Status: AC
  Administered 2018-06-08: 10:00:00 220 mg via INTRAVENOUS
  Filled 2018-06-08: qty 10

## 2018-06-08 MED ORDER — SODIUM CHLORIDE 0.9 % IV SOLN
Freq: Once | INTRAVENOUS | Status: AC
  Administered 2018-06-08: 09:00:00 via INTRAVENOUS

## 2018-06-08 MED ORDER — EPOETIN ALFA 20000 UNIT/ML IJ SOLN
INTRAMUSCULAR | Status: AC
  Filled 2018-06-08: qty 3

## 2018-06-08 MED ORDER — SODIUM CHLORIDE 0.9 % IV SOLN
1.0000 mg/kg | Freq: Once | INTRAVENOUS | Status: AC
  Administered 2018-06-08: 75 mg via INTRAVENOUS
  Filled 2018-06-08: qty 15

## 2018-06-08 MED ORDER — DIPHENHYDRAMINE HCL 25 MG PO CAPS
25.0000 mg | ORAL_CAPSULE | Freq: Once | ORAL | Status: AC
  Administered 2018-06-08: 09:00:00 25 mg via ORAL
  Filled 2018-06-08: qty 1

## 2018-06-08 MED ORDER — HEPARIN SOD (PORK) LOCK FLUSH 100 UNIT/ML IV SOLN
500.0000 [IU] | Freq: Once | INTRAVENOUS | Status: AC | PRN
  Administered 2018-06-08: 11:00:00 500 [IU]

## 2018-06-08 MED ORDER — FAMOTIDINE 20 MG PO TABS
20.0000 mg | ORAL_TABLET | Freq: Once | ORAL | Status: AC
  Administered 2018-06-08: 09:00:00 20 mg via ORAL
  Filled 2018-06-08: qty 1

## 2018-06-08 MED ORDER — SODIUM CHLORIDE 0.9% FLUSH
10.0000 mL | INTRAVENOUS | Status: DC | PRN
  Administered 2018-06-08: 08:00:00 10 mL
  Filled 2018-06-08: qty 10

## 2018-06-08 NOTE — Progress Notes (Signed)
Labs reviewed today in office visit, proceed with treatment today per MD.

## 2018-06-08 NOTE — Patient Instructions (Signed)
[facility] Discharge Instructions for Patients Receiving Chemotherapy  Today you received the following chemotherapy agents   To help prevent nausea and vomiting after your treatment, we encourage you to take your nausea medication   If you develop nausea and vomiting that is not controlled by your nausea medication, call the clinic.   BELOW ARE SYMPTOMS THAT SHOULD BE REPORTED IMMEDIATELY:  *FEVER GREATER THAN 100.5 F  *CHILLS WITH OR WITHOUT FEVER  NAUSEA AND VOMITING THAT IS NOT CONTROLLED WITH YOUR NAUSEA MEDICATION  *UNUSUAL SHORTNESS OF BREATH  *UNUSUAL BRUISING OR BLEEDING  TENDERNESS IN MOUTH AND THROAT WITH OR WITHOUT PRESENCE OF ULCERS  *URINARY PROBLEMS  *BOWEL PROBLEMS  UNUSUAL RASH Items with * indicate a potential emergency and should be followed up as soon as possible.  Feel free to call the clinic should you have any questions or concerns. The clinic phone number is [phone].  Please show the CHEMO ALERT CARD at check-in to the Emergency Department and triage nurse.   

## 2018-06-08 NOTE — Progress Notes (Signed)
Patient was seen today by Dr. Delton Coombes. He complained of increased pain in his chest.  Patient is currently getting pain management from his PCP.  Dr. Delton Coombes asked me to call them to advise of patient's need of increased pain medication dosing.  I have spoken with Denyce Robert, NP via telephone and advised of this.  She verbalized understanding and will call patient to notify him of the change in dosing.

## 2018-06-08 NOTE — Patient Instructions (Addendum)
[facility] Discharge Instructions  You were seen today by Dr. Katragadda. He went over your recent lab results. He will see you back in 2 weeks for labs and follow up.   Thank you for choosing [facility] at [facility] to provide your oncology and hematology care.  To afford each patient quality time with our provider, please arrive at least 15 minutes before your scheduled appointment time.   If you have a lab appointment with the Cancer Center please come in thru the  Main Entrance and check in at the main information desk  You need to re-schedule your appointment should you arrive 10 or more minutes late.  We strive to give you quality time with our providers, and arriving late affects you and other patients whose appointments are after yours.  Also, if you no show three or more times for appointments you may be dismissed from the clinic at the providers discretion.     Again, thank you for choosing [facility].  Our hope is that these requests will decrease the amount of time that you wait before being seen by our physicians.       _____________________________________________________________  Should you have questions after your visit to [facility], please contact our office at [phone] between the hours of 8:00 a.m. and 4:30 p.m.  Voicemails left after 4:00 p.m. will not be returned until the following business day.  For prescription refill requests, have your pharmacy contact our office and allow 72 hours.    Cancer Center Support Programs:   > Cancer Support Group  2nd Tuesday of the month 1pm-2pm, Journey Room    

## 2018-06-08 NOTE — Progress Notes (Signed)
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[place])   12/29/2017 Initial Diagnosis    Non-small cell lung cancer ([place])    04/26/2018 -  Chemotherapy    The patient had ipilimumab (YERVOY) 75 mg in sodium chloride 0.9 % 50 mL chemo infusion, 1 mg/kg = 75 mg (100 % of original dose 1 mg/kg), Intravenous,  Once, 2 of 3 cycles Dose modification: 1 mg/kg (original dose 1 mg/kg, Cycle 1, Reason: Other (see comments), Comment: per trial) Administration: 75 mg (04/26/2018), 75 mg (06/08/2018) nivolumab (OPDIVO) 220 mg in sodium chloride 0.9 % 100 mL chemo infusion, 3 mg/kg = 220 mg (100 % of original dose 3 mg/kg), Intravenous, Once, 4 of 9 cycles Dose modification: 3 mg/kg (original dose 3 mg/kg, Cycle 1, Reason: Other (see comments), Comment: Per trial) Administration: 220 mg (04/26/2018), 220 mg (06/08/2018), 220 mg (05/10/2018), 220 mg (05/24/2018)  for chemotherapy treatment.       CANCER STAGING: Cancer Staging No matching staging information was found for the patient.   INTERVAL HISTORY:  Bradley [name] 78 y.o. male returns for routine follow-up and consideration for next cycle of chemotherapy. He is here today alone. He states that he experienced shortness of breath and chest pain at times since his last visit. He has cough that is productive at times with yellowish sputum. He experienced one day of vomiting and diarrhea. He continues oxygen at night. Denies any nausea. Denies any new pains. Had not noticed any recent bleeding such as epistaxis, hematuria or hematochezia. Denies recent chest pain on exertion, shortness of breath on minimal exertion, pre-syncopal episodes, or palpitations. Denies any numbness or  tingling in hands or feet. Denies any recent fevers, infections, or recent hospitalizations. Patient reports appetite at 100% and energy level at 50%.     REVIEW OF SYSTEMS:  Review of Systems  Respiratory: Positive for cough and shortness of breath.      PAST MEDICAL/SURGICAL HISTORY:  Past Medical History:  Diagnosis Date  . Cancer ([place])    lung  . Chronic lower back pain   . Chronic pain    pain management  . Contact with stonefish as cause of accidental injury 1954   "stung across my left hand in [place]; LUE weaker since"  . COPD (chronic obstructive pulmonary disease) ([place])   . Coronary artery disease   . Dyspnea    with exertion  . High cholesterol   . History of blood transfusion 1943  . History of kidney stones    passed  . Hypertension   . Mass of left lung   . Migraine    "none since the 1990s" (09/11/2016)  . Myocardial infarction ([place]) 01/20/1993; 02/28/1993  . Neuromuscular disorder ([facility])    neuropathy left  . Pneumonia    "several times" (09/11/2016)  . Tobacco abuse    Past Surgical History:  Procedure Laterality Date  . APPENDECTOMY    . CARDIAC CATHETERIZATION    . CATARACT EXTRACTION, BILATERAL Bilateral   . COLONOSCOPY    . HIP ARTHROPLASTY Right 12/20/2016   Procedure: ARTHROPLASTY BIPOLAR HIP (HEMIARTHROPLASTY);  Surgeon: [name], MD;  Location: [facility];  Service: Orthopedics;  Laterality: Right;  .  JOINT REPLACEMENT    . LEFT HEART CATH AND CORONARY ANGIOGRAPHY N/A 09/12/2016   Procedure: LEFT HEART CATH AND CORONARY ANGIOGRAPHY;  Surgeon: Burnell Blanks, MD;  Location: [facility];  Service: Cardiovascular;  Laterality: N/A;  . NASAL ENDOSCOPY WITH EPISTAXIS CONTROL Right 03/15/2016   Procedure: NASAL ENDOSCOPY WITH EPISTAXIS CONTROL;  Surgeon: Jodi Marble, MD;  Location: [facility];  Service: ENT;  Laterality: Right;  . PORTACATH PLACEMENT Right 04/23/2018   Procedure: INSERTION PORT-A-CATH;  Surgeon: Aviva Signs, MD;  Location:  [facility];  Service: General;  Laterality: Right;  . SEPTOPLASTY Right 03/15/2016   Procedure: SEPTOPLASTY;  Surgeon: Jodi Marble, MD;  Location: [facility];  Service: ENT;  Laterality: Right;  . SINUS EXPLORATION Right 03/15/2016   Procedure: SINUS EXPLORATION;  Surgeon: Jodi Marble, MD;  Location: [facility];  Service: ENT;  Laterality: Right;  . TONSILLECTOMY    . TOOTH EXTRACTION    . TOTAL KNEE ARTHROPLASTY Left   . VIDEO BRONCHOSCOPY WITH ENDOBRONCHIAL ULTRASOUND N/A 12/15/2017   Procedure: VIDEO BRONCHOSCOPY WITH ENDOBRONCHIAL ULTRASOUND;  Surgeon: Rigoberto Noel, MD;  Location: [facility];  Service: Thoracic;  Laterality: N/A;     SOCIAL HISTORY:  Social History   Socioeconomic History  . Marital status: Widowed    Spouse name: Not on file  . Number of children: Not on file  . Years of education: Not on file  . Highest education level: Not on file  Occupational History  . Not on file  Social Needs  . Financial resource strain: Not on file  . Food insecurity:    Worry: Not on file    Inability: Not on file  . Transportation needs:    Medical: Not on file    Non-medical: Not on file  Tobacco Use  . Smoking status: Current Every Day Smoker    Packs/day: 0.40    Years: 70.00    Pack years: 28.00    Types: Cigarettes  . Smokeless tobacco: Never Used  . Tobacco comment: smokes 8 a day  Substance and Sexual Activity  . Alcohol use: No    Comment: 11/26/2017  "drank from the age of 8 til 03/05/1993"  . Drug use: Not Currently    Comment: 11/26/2017 "used some drugs when I was young"  . Sexual activity: Not Currently  Lifestyle  . Physical activity:    Days per week: Not on file    Minutes per session: Not on file  . Stress: Not on file  Relationships  . Social connections:    Talks on phone: Not on file    Gets together: Not on file    Attends religious service: Not on file    Active member of club or organization: Not on file    Attends meetings of clubs or organizations: Not  on file    Relationship status: Not on file  . Intimate partner violence:    Fear of current or ex partner: Not on file    Emotionally abused: Not on file    Physically abused: Not on file    Forced sexual activity: Not on file  Other Topics Concern  . Not on file  Social History Narrative  . Not on file    FAMILY HISTORY:  Family History  Adopted: Yes  Problem Relation Age of Onset  . Cancer Mother   . Obesity Father     CURRENT MEDICATIONS:  Outpatient Encounter Medications as of 06/08/2018  Medication Sig Note  .  ADVAIR HFA 115-21 MCG/ACT inhaler Inhale 2 puffs into the lungs every 12 (twelve) hours.   [name] amLODipine (NORVASC) 5 MG tablet TAKE 1 TABLET BY MOUTH EVERY DAY (Patient taking differently: Take 5 mg by mouth daily. )   . Ascorbic Acid (VITAMIN C) 500 MG CAPS Take 500 mg by mouth daily.   [name] aspirin EC 81 MG tablet Take 81 mg by mouth daily.    [name] buPROPion (WELLBUTRIN SR) 150 MG 12 hr tablet Take 1 tablet (150 mg total) by mouth 2 (two) times daily. Take 1 tablet daily for 1 week, stop smoking, then take twice daily after first week. (Patient not taking: Reported on 03/17/2018)   . celecoxib (CELEBREX) 200 MG capsule Take 1 capsule (200 mg total) by mouth daily.   . cyclobenzaprine (FLEXERIL) 5 MG tablet Take 5 mg by mouth 2 (two) times daily.    . Ferrous Sulfate (IRON) 325 (65 Fe) MG TABS Take 1 tablet by mouth daily.   . fluticasone (FLONASE) 50 MCG/ACT nasal spray Place 2 sprays into both nostrils daily.   [name] gabapentin (NEURONTIN) 300 MG capsule Take 1 capsule by mouth 3 (three) times daily.   [name] Ipilimumab (YERVOY IV) Inject into the vein every 6 (six) weeks.   [name] ipratropium-albuterol (DUONEB) 0.5-2.5 (3) MG/3ML SOLN Take 3 mLs by nebulization 3 (three) times daily.   [name] lidocaine-prilocaine (EMLA) cream Apply 1 application topically as needed. Apply a small amount to port site and cover with plastic wrap 1 hour prior to infusion appointments   . lisinopril  (PRINIVIL,ZESTRIL) 20 MG tablet Take 0.5 tablets (10 mg total) by mouth daily. (Patient taking differently: Take 20 mg by mouth daily. )   . metoCLOPramide (REGLAN) 5 MG tablet Take 1 tablet by mouth 4 (four) times daily -  before meals and at bedtime.   . metoprolol succinate (TOPROL-XL) 25 MG 24 hr tablet Take 25 mg by mouth daily.   . mirabegron ER (MYRBETRIQ) 25 MG TB24 tablet Take 25 mg by mouth at bedtime.    . Multiple Vitamins-Minerals (MULTIVITAMIN WITH MINERALS) tablet Take 1 tablet by mouth daily.   . nitroGLYCERIN (NITROSTAT) 0.4 MG SL tablet Place 0.4 mg under the tongue every 5 (five) minutes as needed for chest pain.   . Nivolumab (OPDIVO IV) Inject into the vein every 14 (fourteen) days.   [name] omeprazole (PRILOSEC) 40 MG capsule Take 40 mg by mouth daily.    [name] oxyCODONE (OXY IR/ROXICODONE) 5 MG immediate release tablet Take 1 tablet (5 mg total) by mouth every 4 (four) hours as needed for severe pain. 05/24/2018: 63m per patient  . potassium gluconate (HM POTASSIUM) 595 (99 K) MG TABS tablet Take 595 mg by mouth daily.   . simvastatin (ZOCOR) 10 MG tablet Take 10 mg by mouth at bedtime.    . VENTOLIN HFA 108 (90 Base) MCG/ACT inhaler Inhale 2 puffs into the lungs every 4 (four) hours as needed for wheezing or shortness of breath.     No facility-administered encounter medications on file as of 06/08/2018.     ALLERGIES:  Allergies  Allergen Reactions  . Aspirin     Regular asa      PHYSICAL EXAM:  ECOG Performance status: 1  Vitals:   06/08/18 0805  BP: (!) 141/75  Pulse: 82  Resp: 18  Temp: (!) 97.4 F (36.3 C)  SpO2: 93%   Filed Weights   06/08/18 0805  Weight: 157 lb 3.2 oz (71.3 kg)  Physical Exam Vitals signs reviewed.  Constitutional:      Appearance: Normal appearance.  Cardiovascular:     Rate and Rhythm: Normal rate and regular rhythm.     Heart sounds: Normal heart sounds.  Pulmonary:     Effort: Pulmonary effort is normal.     Breath  sounds: Normal breath sounds.  Abdominal:     General: There is no distension.     Palpations: Abdomen is soft. There is no mass.  Musculoskeletal:        General: No swelling.  Skin:    General: Skin is warm.  Neurological:     General: No focal deficit present.     Mental Status: He is alert and oriented to person, place, and time.  Psychiatric:        Mood and Affect: Mood normal.        Behavior: Behavior normal.      LABORATORY DATA:  I have reviewed the labs as listed.  CBC    Component Value Date/Time   WBC 18.2 (H) 06/08/2018 0812   RBC 4.95 06/08/2018 0812   HGB 14.4 06/08/2018 0812   HCT 45.5 06/08/2018 0812   PLT 527 (H) 06/08/2018 0812   MCV 91.9 06/08/2018 0812   MCH 29.1 06/08/2018 0812   MCHC 31.6 06/08/2018 0812   RDW 17.3 (H) 06/08/2018 0812   LYMPHSABS 3.9 06/08/2018 0812   MONOABS 1.0 06/08/2018 0812   EOSABS 1.3 (H) 06/08/2018 0812   BASOSABS 0.1 06/08/2018 0812   CMP Latest Ref Rng & Units 06/08/2018 05/24/2018 05/10/2018  Glucose 70 - 99 mg/dL 103(H) 100(H) 98  BUN 8 - 23 mg/dL '20 22 22  ' Creatinine 0.61 - 1.24 mg/dL 1.01 1.06 1.09  Sodium 135 - 145 mmol/L 138 139 136  Potassium 3.5 - 5.1 mmol/L 4.1 4.2 4.1  Chloride 98 - 111 mmol/L 105 105 106  CO2 22 - 32 mmol/L 24 23 21(L)  Calcium 8.9 - 10.3 mg/dL 8.6(L) 8.7(L) 8.2(L)  Total Protein 6.5 - 8.1 g/dL 8.4(H) 8.0 7.7  Total Bilirubin 0.3 - 1.2 mg/dL 0.6 0.5 1.0  Alkaline Phos 38 - 126 U/L 49 40 45  AST 15 - 41 U/L 13(L) 13(L) 19  ALT 0 - 44 U/L '12 7 14       ' DIAGNOSTIC IMAGING:  I have independently reviewed the scans and discussed with the patient.   I have reviewed Venita Lick LPN's note and agree with the documentation.  I personally performed a face-to-face visit, made revisions and my assessment and plan is as follows.    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  Non-small cell lung cancer, adenocarcinoma type: -he was evaluated by me as inpatient  when he was hospitalized on 12/29/2017. - Left lower lobe endobronchial biopsy on 12/15/2017 shows non-small cell lung cancer, TTF-1 positive consistent with pulmonary adenocarcinoma. - He was reluctant to consider any chemotherapy when he was inpatient. -He also developed new lesions in the lung, most likely infectious -MRI of the brain on 12/29/2017 did not show any evidence of intracranial metastasis.. -PET/CT scan on 02/16/2018 shows dominant perihilar left lung mass with mildly increased in size.  There is new hypermetabolic nodule in the left upper lobe and new hypermetabolic lesion in the dome of the liver.  Right upper lobe lung nodules have slightly decreased in size.  Similar appearance of hypermetabolic mediastinal and hilar adenopathy.  - Rebiopsy of the liver lesion on 03/17/2018 for  molecular studies shows poorly differentiated carcinoma. - Foundation 1 testing shows MS-stable, TMB 20 Muts/Mb, BRAF amplification, MET amplification, MYC and KIT amplification, PDGFRA/SOX2 amplifications - PDL 1 testing could not be done because of not enough tissue. - Combination immunotherapy with ipilimumab and nivolumab started on 04/26/2018. -He is wearing oxygen at nighttime.  He reports slight worsening of shortness of breath on exertion. -He has developed some sharp pain in the left anterior chest wall if he takes a deep breath or if he lifts his left arm above the shoulder level. - He does not report any immunotherapy related side effects.  I have reviewed his blood work. -I will proceed with cycle 2 of combination therapy today. -I will reevaluate him in 2 weeks.  2.  Left chest wall pain: - He has sharp pain when he takes a deep breath or movement of his left shoulder. -He has malignancy in the left lung but not pleural-based. -He is currently taking Percocet 10 mg every 6 hours.  I would contact his PMD to see if it can be increased to every 4 hours. -If they want [place] to, I will be glad to take  over his pain management.   Total time spent is 25 minutes with more than 50% of the time spent face-to-face discussing treatment plan, pain management and coordination of care.  Orders placed this encounter:  No orders of the defined types were placed in this encounter.     Derek Jack, MD [place] [phone]

## 2018-06-08 NOTE — Assessment & Plan Note (Signed)
1.  Non-small cell lung cancer, adenocarcinoma type: -he was evaluated by me as inpatient when he was hospitalized on 12/29/2017. - Left lower lobe endobronchial biopsy on 12/15/2017 shows non-small cell lung cancer, TTF-1 positive consistent with pulmonary adenocarcinoma. - He was reluctant to consider any chemotherapy when he was inpatient. -He also developed new lesions in the lung, most likely infectious -MRI of the brain on 12/29/2017 did not show any evidence of intracranial metastasis.. -PET/CT scan on 02/16/2018 shows dominant perihilar left lung mass with mildly increased in size.  There is new hypermetabolic nodule in the left upper lobe and new hypermetabolic lesion in the dome of the liver.  Right upper lobe lung nodules have slightly decreased in size.  Similar appearance of hypermetabolic mediastinal and hilar adenopathy.  - Rebiopsy of the liver lesion on 03/17/2018 for molecular studies shows poorly differentiated carcinoma. - Foundation 1 testing shows MS-stable, TMB 20 Muts/Mb, BRAF amplification, MET amplification, MYC and KIT amplification, PDGFRA/SOX2 amplifications - PDL 1 testing could not be done because of not enough tissue. - Combination immunotherapy with ipilimumab and nivolumab started on 04/26/2018. -He is wearing oxygen at nighttime.  He reports slight worsening of shortness of breath on exertion. -He has developed some sharp pain in the left anterior chest wall if he takes a deep breath or if he lifts his left arm above the shoulder level. - He does not report any immunotherapy related side effects.  I have reviewed his blood work. -I will proceed with cycle 2 of combination therapy today. -I will reevaluate him in 2 weeks.  2.  Left chest wall pain: - He has sharp pain when he takes a deep breath or movement of his left shoulder. -He has malignancy in the left lung but not pleural-based. -He is currently taking Percocet 10 mg every 6 hours.  I would contact his PMD to  see if it can be increased to every 4 hours. -If they want [place] to, I will be glad to take over his pain management.

## 2018-06-10 DIAGNOSIS — J189 Pneumonia, unspecified organism: Secondary | ICD-10-CM | POA: Diagnosis not present

## 2018-06-10 DIAGNOSIS — S72009A Fracture of unspecified part of neck of unspecified femur, initial encounter for closed fracture: Secondary | ICD-10-CM | POA: Diagnosis not present

## 2018-06-10 DIAGNOSIS — J438 Other emphysema: Secondary | ICD-10-CM | POA: Diagnosis not present

## 2018-06-10 DIAGNOSIS — R0602 Shortness of breath: Secondary | ICD-10-CM | POA: Diagnosis not present

## 2018-06-13 ENCOUNTER — Other Ambulatory Visit: Payer: Self-pay

## 2018-06-13 ENCOUNTER — Emergency Department (HOSPITAL_COMMUNITY): Payer: Medicare Other

## 2018-06-13 ENCOUNTER — Inpatient Hospital Stay (HOSPITAL_COMMUNITY)
Admission: EM | Admit: 2018-06-13 | Discharge: 2018-06-18 | DRG: 194 | Disposition: A | Discharge status: Home Health | Payer: Medicare Other | Attending: Internal Medicine | Admitting: Internal Medicine

## 2018-06-13 ENCOUNTER — Encounter (HOSPITAL_COMMUNITY): Payer: Self-pay

## 2018-06-13 DIAGNOSIS — Z515 Encounter for palliative care: Secondary | ICD-10-CM

## 2018-06-13 DIAGNOSIS — Z791 Long term (current) use of non-steroidal anti-inflammatories (NSAID): Secondary | ICD-10-CM

## 2018-06-13 DIAGNOSIS — G8929 Other chronic pain: Secondary | ICD-10-CM | POA: Diagnosis present

## 2018-06-13 DIAGNOSIS — J439 Emphysema, unspecified: Secondary | ICD-10-CM | POA: Diagnosis not present

## 2018-06-13 DIAGNOSIS — G629 Polyneuropathy, unspecified: Secondary | ICD-10-CM | POA: Diagnosis present

## 2018-06-13 DIAGNOSIS — J90 Pleural effusion, not elsewhere classified: Secondary | ICD-10-CM | POA: Diagnosis not present

## 2018-06-13 DIAGNOSIS — Z9981 Dependence on supplemental oxygen: Secondary | ICD-10-CM

## 2018-06-13 DIAGNOSIS — C3492 Malignant neoplasm of unspecified part of left bronchus or lung: Secondary | ICD-10-CM | POA: Diagnosis present

## 2018-06-13 DIAGNOSIS — J438 Other emphysema: Secondary | ICD-10-CM | POA: Diagnosis not present

## 2018-06-13 DIAGNOSIS — R079 Chest pain, unspecified: Secondary | ICD-10-CM | POA: Diagnosis not present

## 2018-06-13 DIAGNOSIS — F17210 Nicotine dependence, cigarettes, uncomplicated: Secondary | ICD-10-CM | POA: Diagnosis present

## 2018-06-13 DIAGNOSIS — E7800 Pure hypercholesterolemia, unspecified: Secondary | ICD-10-CM | POA: Diagnosis present

## 2018-06-13 DIAGNOSIS — J440 Chronic obstructive pulmonary disease with acute lower respiratory infection: Secondary | ICD-10-CM | POA: Diagnosis not present

## 2018-06-13 DIAGNOSIS — Z7189 Other specified counseling: Secondary | ICD-10-CM | POA: Diagnosis not present

## 2018-06-13 DIAGNOSIS — Z96641 Presence of right artificial hip joint: Secondary | ICD-10-CM | POA: Diagnosis not present

## 2018-06-13 DIAGNOSIS — Z7951 Long term (current) use of inhaled steroids: Secondary | ICD-10-CM

## 2018-06-13 DIAGNOSIS — Z79899 Other long term (current) drug therapy: Secondary | ICD-10-CM

## 2018-06-13 DIAGNOSIS — J918 Pleural effusion in other conditions classified elsewhere: Secondary | ICD-10-CM | POA: Diagnosis present

## 2018-06-13 DIAGNOSIS — C3490 Malignant neoplasm of unspecified part of unspecified bronchus or lung: Secondary | ICD-10-CM | POA: Diagnosis not present

## 2018-06-13 DIAGNOSIS — Z87442 Personal history of urinary calculi: Secondary | ICD-10-CM | POA: Diagnosis not present

## 2018-06-13 DIAGNOSIS — S72009A Fracture of unspecified part of neck of unspecified femur, initial encounter for closed fracture: Secondary | ICD-10-CM | POA: Diagnosis not present

## 2018-06-13 DIAGNOSIS — J9611 Chronic respiratory failure with hypoxia: Secondary | ICD-10-CM | POA: Diagnosis present

## 2018-06-13 DIAGNOSIS — Z886 Allergy status to analgesic agent status: Secondary | ICD-10-CM | POA: Diagnosis not present

## 2018-06-13 DIAGNOSIS — C787 Secondary malignant neoplasm of liver and intrahepatic bile duct: Secondary | ICD-10-CM | POA: Diagnosis present

## 2018-06-13 DIAGNOSIS — Z66 Do not resuscitate: Secondary | ICD-10-CM | POA: Diagnosis not present

## 2018-06-13 DIAGNOSIS — M545 Low back pain: Secondary | ICD-10-CM | POA: Diagnosis present

## 2018-06-13 DIAGNOSIS — Z1159 Encounter for screening for other viral diseases: Secondary | ICD-10-CM

## 2018-06-13 DIAGNOSIS — J984 Other disorders of lung: Secondary | ICD-10-CM | POA: Diagnosis not present

## 2018-06-13 DIAGNOSIS — D72829 Elevated white blood cell count, unspecified: Secondary | ICD-10-CM | POA: Diagnosis not present

## 2018-06-13 DIAGNOSIS — I252 Old myocardial infarction: Secondary | ICD-10-CM | POA: Diagnosis not present

## 2018-06-13 DIAGNOSIS — D899 Disorder involving the immune mechanism, unspecified: Secondary | ICD-10-CM | POA: Diagnosis present

## 2018-06-13 DIAGNOSIS — J189 Pneumonia, unspecified organism: Secondary | ICD-10-CM | POA: Diagnosis not present

## 2018-06-13 DIAGNOSIS — Z7982 Long term (current) use of aspirin: Secondary | ICD-10-CM

## 2018-06-13 DIAGNOSIS — I10 Essential (primary) hypertension: Secondary | ICD-10-CM | POA: Diagnosis not present

## 2018-06-13 DIAGNOSIS — R0602 Shortness of breath: Secondary | ICD-10-CM

## 2018-06-13 DIAGNOSIS — C799 Secondary malignant neoplasm of unspecified site: Secondary | ICD-10-CM

## 2018-06-13 DIAGNOSIS — Z96652 Presence of left artificial knee joint: Secondary | ICD-10-CM | POA: Diagnosis present

## 2018-06-13 DIAGNOSIS — J948 Other specified pleural conditions: Secondary | ICD-10-CM | POA: Diagnosis not present

## 2018-06-13 DIAGNOSIS — Z809 Family history of malignant neoplasm, unspecified: Secondary | ICD-10-CM

## 2018-06-13 DIAGNOSIS — I2510 Atherosclerotic heart disease of native coronary artery without angina pectoris: Secondary | ICD-10-CM | POA: Diagnosis not present

## 2018-06-13 DIAGNOSIS — H9190 Unspecified hearing loss, unspecified ear: Secondary | ICD-10-CM | POA: Diagnosis not present

## 2018-06-13 DIAGNOSIS — J449 Chronic obstructive pulmonary disease, unspecified: Secondary | ICD-10-CM | POA: Diagnosis not present

## 2018-06-13 LAB — COMPREHENSIVE METABOLIC PANEL
ALT: 7 U/L (ref 0–44)
AST: 30 U/L (ref 15–41)
Albumin: 2.5 g/dL — ABNORMAL LOW (ref 3.5–5.0)
Alkaline Phosphatase: 47 U/L (ref 38–126)
Anion gap: 10 (ref 5–15)
BUN: 26 mg/dL — ABNORMAL HIGH (ref 8–23)
CO2: 21 mmol/L — ABNORMAL LOW (ref 22–32)
Calcium: 8.3 mg/dL — ABNORMAL LOW (ref 8.9–10.3)
Chloride: 105 mmol/L (ref 98–111)
Creatinine, Ser: 0.98 mg/dL (ref 0.61–1.24)
GFR calc Af Amer: >60 mL/min (ref >60)
GFR calc non Af Amer: >60 mL/min (ref >60)
Glucose, Bld: 109 mg/dL — ABNORMAL HIGH (ref 70–99)
Potassium: 4.6 mmol/L (ref 3.5–5.1)
Sodium: 136 mmol/L (ref 135–145)
Total Bilirubin: 1.2 mg/dL (ref 0.3–1.2)
Total Protein: 8 g/dL (ref 6.5–8.1)

## 2018-06-13 LAB — CBC WITH DIFFERENTIAL/PLATELET
Abs Immature Granulocytes: 0.05 10*3/uL (ref 0.00–0.07)
Basophils Absolute: 0.1 10*3/uL (ref 0.0–0.1)
Basophils Relative: 1 %
Eosinophils Absolute: 0.8 10*3/uL — ABNORMAL HIGH (ref 0.0–0.5)
Eosinophils Relative: 6 %
HCT: 42.9 % (ref 39.0–52.0)
Hemoglobin: 13.4 g/dL (ref 13.0–17.0)
Immature Granulocytes: 0 %
Lymphocytes Relative: 29 %
Lymphs Abs: 3.5 10*3/uL (ref 0.7–4.0)
MCH: 28.5 pg (ref 26.0–34.0)
MCHC: 31.2 g/dL (ref 30.0–36.0)
MCV: 91.3 fL (ref 80.0–100.0)
Monocytes Absolute: 0.8 10*3/uL (ref 0.1–1.0)
Monocytes Relative: 7 %
Neutro Abs: 6.8 10*3/uL (ref 1.7–7.7)
Neutrophils Relative %: 57 %
Platelets: 537 10*3/uL — ABNORMAL HIGH (ref 150–400)
RBC: 4.7 MIL/uL (ref 4.22–5.81)
RDW: 16.9 % — ABNORMAL HIGH (ref 11.5–15.5)
WBC: 12 10*3/uL — ABNORMAL HIGH (ref 4.0–10.5)
nRBC: 0 % (ref 0.0–0.2)

## 2018-06-13 LAB — URINALYSIS, ROUTINE W REFLEX MICROSCOPIC
Bilirubin Urine: NEGATIVE
Glucose, UA: NEGATIVE mg/dL
Hgb urine dipstick: NEGATIVE
Ketones, ur: NEGATIVE mg/dL
Leukocytes,Ua: NEGATIVE
Nitrite: NEGATIVE
Protein, ur: NEGATIVE mg/dL
Specific Gravity, Urine: 1.025 (ref 1.005–1.030)
pH: 5 (ref 5.0–8.0)

## 2018-06-13 LAB — LACTIC ACID, PLASMA
Lactic Acid, Venous: 0.8 mmol/L (ref 0.5–1.9)
Lactic Acid, Venous: 1.2 mmol/L (ref 0.5–1.9)

## 2018-06-13 LAB — BRAIN NATRIURETIC PEPTIDE: B Natriuretic Peptide: 47 pg/mL (ref 0.0–100.0)

## 2018-06-13 LAB — SARS CORONAVIRUS 2 BY RT PCR (HOSPITAL ORDER, PERFORMED IN ~~LOC~~ HOSPITAL LAB): SARS Coronavirus 2: NEGATIVE

## 2018-06-13 LAB — TROPONIN I: Troponin I: <0.03 ng/mL (ref <0.03)

## 2018-06-13 MED ORDER — BUPROPION HCL ER (SR) 150 MG PO TB12
150.0000 mg | ORAL_TABLET | Freq: Two times a day (BID) | ORAL | Status: DC
  Administered 2018-06-13 – 2018-06-18 (×10): 150 mg via ORAL
  Filled 2018-06-13 (×11): qty 1

## 2018-06-13 MED ORDER — PIPERACILLIN-TAZOBACTAM 3.375 G IVPB 30 MIN
3.3750 g | Freq: Once | INTRAVENOUS | Status: AC
  Administered 2018-06-13: 3.375 g via INTRAVENOUS
  Filled 2018-06-13: qty 50

## 2018-06-13 MED ORDER — ALBUTEROL SULFATE (2.5 MG/3ML) 0.083% IN NEBU: 3.0000 mL | INHALATION_SOLUTION | RESPIRATORY_TRACT | Status: DC | PRN

## 2018-06-13 MED ORDER — VANCOMYCIN HCL IN DEXTROSE 1-5 GM/200ML-% IV SOLN
1000.0000 mg | Freq: Once | INTRAVENOUS | Status: AC
  Administered 2018-06-13: 1000 mg via INTRAVENOUS
  Filled 2018-06-13: qty 200

## 2018-06-13 MED ORDER — MONTELUKAST SODIUM 10 MG PO TABS
10.0000 mg | ORAL_TABLET | Freq: Every day | ORAL | Status: DC
  Administered 2018-06-13 – 2018-06-17 (×5): 10 mg via ORAL
  Filled 2018-06-13 (×5): qty 1

## 2018-06-13 MED ORDER — FLUTICASONE PROPIONATE 50 MCG/ACT NA SUSP
2.0000 | Freq: Every day | NASAL | Status: DC
  Administered 2018-06-15 – 2018-06-18 (×4): 2 via NASAL
  Filled 2018-06-13: qty 16

## 2018-06-13 MED ORDER — ONDANSETRON HCL 4 MG PO TABS: 4.0000 mg | ORAL_TABLET | Freq: Four times a day (QID) | ORAL | Status: DC | PRN

## 2018-06-13 MED ORDER — PANTOPRAZOLE SODIUM 40 MG PO TBEC
40.0000 mg | DELAYED_RELEASE_TABLET | Freq: Every day | ORAL | Status: DC
  Administered 2018-06-14 – 2018-06-18 (×5): 40 mg via ORAL
  Filled 2018-06-13 (×5): qty 1

## 2018-06-13 MED ORDER — POLYETHYLENE GLYCOL 3350 17 G PO PACK: 17.0000 g | PACK | Freq: Every day | ORAL | Status: DC | PRN

## 2018-06-13 MED ORDER — MORPHINE SULFATE (PF) 4 MG/ML IV SOLN: 4.0000 mg | Freq: Four times a day (QID) | INTRAVENOUS | Status: DC | PRN

## 2018-06-13 MED ORDER — MORPHINE SULFATE (PF) 2 MG/ML IV SOLN
INTRAVENOUS | Status: AC
  Administered 2018-06-13: 2 mg via INTRAVENOUS
  Filled 2018-06-13: qty 1

## 2018-06-13 MED ORDER — ACETAMINOPHEN 650 MG RE SUPP: 650.0000 mg | Freq: Four times a day (QID) | RECTAL | Status: DC | PRN

## 2018-06-13 MED ORDER — IOHEXOL 350 MG/ML SOLN
75.0000 mL | Freq: Once | INTRAVENOUS | Status: AC | PRN
  Administered 2018-06-13: 75 mL via INTRAVENOUS

## 2018-06-13 MED ORDER — AMLODIPINE BESYLATE 5 MG PO TABS
5.0000 mg | ORAL_TABLET | Freq: Every day | ORAL | Status: DC
  Administered 2018-06-14 – 2018-06-18 (×5): 5 mg via ORAL
  Filled 2018-06-13 (×5): qty 1

## 2018-06-13 MED ORDER — METOPROLOL SUCCINATE ER 25 MG PO TB24
25.0000 mg | ORAL_TABLET | Freq: Every day | ORAL | Status: DC
  Administered 2018-06-14 – 2018-06-18 (×5): 25 mg via ORAL
  Filled 2018-06-13 (×5): qty 1

## 2018-06-13 MED ORDER — ACETAMINOPHEN 325 MG PO TABS: 650.0000 mg | ORAL_TABLET | Freq: Four times a day (QID) | ORAL | Status: DC | PRN

## 2018-06-13 MED ORDER — ONDANSETRON HCL 4 MG/2ML IJ SOLN: 4.0000 mg | Freq: Four times a day (QID) | INTRAMUSCULAR | Status: DC | PRN

## 2018-06-13 MED ORDER — GABAPENTIN 300 MG PO CAPS
300.0000 mg | ORAL_CAPSULE | Freq: Three times a day (TID) | ORAL | Status: DC
  Administered 2018-06-13 – 2018-06-18 (×14): 300 mg via ORAL
  Filled 2018-06-13 (×14): qty 1

## 2018-06-13 MED ORDER — MORPHINE SULFATE (PF) 2 MG/ML IV SOLN
2.0000 mg | INTRAVENOUS | Status: DC | PRN
  Administered 2018-06-13 – 2018-06-17 (×17): 2 mg via INTRAVENOUS
  Filled 2018-06-13 (×17): qty 1

## 2018-06-13 MED ORDER — SODIUM CHLORIDE 0.9 % IV SOLN
500.0000 mg | INTRAVENOUS | Status: DC
  Administered 2018-06-13 – 2018-06-15 (×3): 500 mg via INTRAVENOUS
  Filled 2018-06-13 (×4): qty 500

## 2018-06-13 MED ORDER — MORPHINE SULFATE (PF) 4 MG/ML IV SOLN
4.0000 mg | Freq: Once | INTRAVENOUS | Status: AC
  Administered 2018-06-13: 4 mg via INTRAVENOUS
  Filled 2018-06-13: qty 1

## 2018-06-13 MED ORDER — VANCOMYCIN HCL IN DEXTROSE 1-5 GM/200ML-% IV SOLN
1000.0000 mg | Freq: Two times a day (BID) | INTRAVENOUS | Status: DC
  Administered 2018-06-14: 1000 mg via INTRAVENOUS
  Filled 2018-06-13 (×2): qty 200

## 2018-06-13 MED ORDER — PIPERACILLIN-TAZOBACTAM 3.375 G IVPB
3.3750 g | Freq: Three times a day (TID) | INTRAVENOUS | Status: DC
  Administered 2018-06-14 – 2018-06-18 (×14): 3.375 g via INTRAVENOUS
  Filled 2018-06-13 (×13): qty 50

## 2018-06-13 MED ORDER — ENOXAPARIN SODIUM 40 MG/0.4ML ~~LOC~~ SOLN
40.0000 mg | SUBCUTANEOUS | Status: DC
  Administered 2018-06-13: 40 mg via SUBCUTANEOUS
  Filled 2018-06-13: qty 0.4

## 2018-06-13 MED ORDER — SODIUM CHLORIDE 0.9 % IV SOLN
INTRAVENOUS | Status: AC
  Administered 2018-06-13: 23:00:00 via INTRAVENOUS

## 2018-06-13 NOTE — H&P (Signed)
History and Physical    Bradley [name] SWF:[id number] DOB: 1940-11-18 DOA: 06/13/2018  PCP: Jani Gravel, MD   Patient coming from: Home  I have personally briefly reviewed patient's old medical records in [place]  Chief Complaint: Chest pain, Cough  HPI: Bradley [name] is a 78 y.o. 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  Difficulty breathing and cough are chronic.  Chest pain is left-sided, worse with deep breathing and coughing, progressively worsening over the past week. He denies associated fever or chills, no leg swelling redness or pain.  No sick contacts.  No sore throat, no myalgias.  ED Course: Temperature 97.9, intermittent tachypnea to 27, blood pressure systolic 220U to 542H, O2 sats greater than 90% on 3 L O2.  WBC 12.  Normal lactic acid 0.8.  Normal BNP 47.  EKG sinus rhythm without significant ST or T wave abnormalities.  Portable chest x-ray widespread opacification in the left hemithorax, subsequent CT anterior chest- negative for PE, loculated left pleural effusion with extensive airspace disease on the left likely pneumonia, new anterior mediastinal mass suspicious for recurrent tumor, new liver lesions compatible with metastatic disease (pls see detailed report).  Patient was given IV vancomycin and Zosyn, hospitalist to admit for pneumonia.   Review of Systems: As per HPI all other systems reviewed and negative.  Past Medical History:  Diagnosis Date  . Cancer ([place])    lung  . Chronic lower back pain   . Chronic pain    pain management  . Contact with stonefish as cause of accidental injury 1954   "stung across my left hand in [place]; LUE weaker since"  . COPD (chronic obstructive pulmonary disease) ([place])    . Coronary artery disease   . Dyspnea    with exertion  . High cholesterol   . History of blood transfusion 1943  . History of kidney stones    passed  . Hypertension   . Mass of left lung   . Migraine    "none since the 1990s" (09/11/2016)  . Myocardial infarction ([place]) 01/20/1993; 02/28/1993  . Neuromuscular disorder ([facility])    neuropathy left  . Pneumonia    "several times" (09/11/2016)  . Tobacco abuse     Past Surgical History:  Procedure Laterality Date  . APPENDECTOMY    . CARDIAC CATHETERIZATION    . CATARACT EXTRACTION, BILATERAL Bilateral   . COLONOSCOPY    . HIP ARTHROPLASTY Right 12/20/2016   Procedure: ARTHROPLASTY BIPOLAR HIP (HEMIARTHROPLASTY);  Surgeon: [name], MD;  Location: [facility];  Service: Orthopedics;  Laterality: Right;  . JOINT REPLACEMENT    . LEFT HEART CATH AND CORONARY ANGIOGRAPHY N/A 09/12/2016   Procedure: LEFT HEART CATH AND CORONARY ANGIOGRAPHY;  Surgeon: Burnell Blanks, MD;  Location: [facility];  Service: Cardiovascular;  Laterality: N/A;  . NASAL ENDOSCOPY WITH EPISTAXIS CONTROL Right 03/15/2016   Procedure: NASAL ENDOSCOPY WITH EPISTAXIS CONTROL;  Surgeon: Jodi Marble, MD;  Location: [facility];  Service: ENT;  Laterality: Right;  . PORTACATH PLACEMENT Right 04/23/2018   Procedure: INSERTION PORT-A-CATH;  Surgeon: Aviva Signs, MD;  Location: [facility];  Service: General;  Laterality: Right;  . SEPTOPLASTY Right 03/15/2016   Procedure: SEPTOPLASTY;  Surgeon: Jodi Marble, MD;  Location: [facility];  Service:  ENT;  Laterality: Right;  . SINUS EXPLORATION Right 03/15/2016   Procedure: SINUS EXPLORATION;  Surgeon: Jodi Marble, MD;  Location: [facility];  Service: ENT;  Laterality: Right;  . TONSILLECTOMY    . TOOTH EXTRACTION    . TOTAL KNEE ARTHROPLASTY Left   . VIDEO BRONCHOSCOPY WITH ENDOBRONCHIAL ULTRASOUND N/A 12/15/2017   Procedure: VIDEO BRONCHOSCOPY WITH ENDOBRONCHIAL ULTRASOUND;  Surgeon: Rigoberto Noel, MD;  Location: [facility];   Service: Thoracic;  Laterality: N/A;     reports that he has been smoking cigarettes. He has a 70.00 pack-year smoking history. He has never used smokeless tobacco. He reports previous drug use. He reports that he does not drink alcohol.  Allergies  Allergen Reactions  . Aspirin     Regular asa     Family History  Adopted: Yes  Problem Relation Age of Onset  . Cancer Mother   . Obesity Father     Prior to Admission medications   Medication Sig Start Date End Date Taking? Authorizing Provider  ADVAIR HFA 115-21 MCG/ACT inhaler Inhale 2 puffs into the lungs every 12 (twelve) hours. 01/29/16  Yes [provider]  amLODipine (NORVASC) 5 MG tablet TAKE 1 TABLET BY MOUTH EVERY DAY Patient taking differently: Take 5 mg by mouth daily.  08/24/17  Yes Lendon Colonel, NP  Ascorbic Acid (VITAMIN C) 500 MG CAPS Take 500 mg by mouth daily.   Yes [provider]  aspirin EC 81 MG tablet Take 81 mg by mouth daily.    Yes [provider]  buPROPion (WELLBUTRIN SR) 150 MG 12 hr tablet Take 1 tablet (150 mg total) by mouth 2 (two) times daily. Take 1 tablet daily for 1 week, stop smoking, then take twice daily after first week. 12/09/17 06/13/18 Yes Fenton Foy, NP  celecoxib (CELEBREX) 200 MG capsule Take 1 capsule (200 mg total) by mouth daily. 12/22/16  Yes [name], MD  cyclobenzaprine (FLEXERIL) 5 MG tablet Take 5 mg by mouth 2 (two) times daily.    Yes [provider]  Ferrous Sulfate (IRON) 325 (65 Fe) MG TABS Take 1 tablet by mouth daily. 12/10/15  Yes [provider]  fluticasone (FLONASE) 50 MCG/ACT nasal spray Place 2 sprays into both nostrils daily. 12/11/17  Yes [provider]  gabapentin (NEURONTIN) 300 MG capsule Take 1 capsule by mouth 3 (three) times daily. 08/15/16  Yes [provider]  Ipilimumab (YERVOY IV) Inject into the vein every 6 (six) weeks.   Yes [provider]  ipratropium-albuterol  (DUONEB) 0.5-2.5 (3) MG/3ML SOLN Take 3 mLs by nebulization 3 (three) times daily. 01/01/18  Yes Johnson, Clanford L, MD  lidocaine-prilocaine (EMLA) cream Apply 1 application topically as needed. Apply a small amount to port site and cover with plastic wrap 1 hour prior to infusion appointments 04/23/18  Yes Derek Jack, MD  lisinopril (PRINIVIL,ZESTRIL) 20 MG tablet Take 0.5 tablets (10 mg total) by mouth daily. Patient taking differently: Take 20 mg by mouth daily.  01/01/18  Yes Johnson, Clanford L, MD  metoCLOPramide (REGLAN) 5 MG tablet Take 1 tablet by mouth 4 (four) times daily -  before meals and at bedtime. 01/25/16  Yes [provider]  metoprolol succinate (TOPROL-XL) 25 MG 24 hr tablet Take 25 mg by mouth daily.   Yes [provider]  mirabegron ER (MYRBETRIQ) 25 MG TB24 tablet Take 25 mg by mouth at bedtime.    Yes [provider]  montelukast (SINGULAIR) 10  MG tablet Take 1 tablet by mouth daily. 06/02/18  Yes [provider]  Multiple Vitamins-Minerals (MULTIVITAMIN WITH MINERALS) tablet Take 1 tablet by mouth daily.   Yes [provider]  nitroGLYCERIN (NITROSTAT) 0.4 MG SL tablet Place 0.4 mg under the tongue every 5 (five) minutes as needed for chest pain.   Yes [provider]  Nivolumab (OPDIVO IV) Inject into the vein every 14 (fourteen) days.   Yes [provider]  omeprazole (PRILOSEC) 40 MG capsule Take 40 mg by mouth daily.    Yes [provider]  oxyCODONE (OXY IR/ROXICODONE) 5 MG immediate release tablet Take 1 tablet (5 mg total) by mouth every 4 (four) hours as needed for severe pain. 04/23/18  Yes Aviva Signs, MD  potassium gluconate (HM POTASSIUM) 595 (99 K) MG TABS tablet Take 595 mg by mouth daily.   Yes [provider]  simvastatin (ZOCOR) 10 MG tablet Take 10 mg by mouth at bedtime.  12/23/15  Yes [provider]  VENTOLIN HFA 108 (90 Base) MCG/ACT inhaler Inhale 2 puffs  into the lungs every 4 (four) hours as needed for wheezing or shortness of breath.  12/11/15  Yes [provider]    Physical Exam: Vitals:   06/13/18 1630 06/13/18 1645 06/13/18 1800 06/13/18 1830  BP: 127/79     Pulse: 79 79 76 77  Resp: 20 17 19 18   Temp:      TempSrc:      SpO2: 96% 96% 97% 93%  Weight:      Height:        Constitutional: NAD, calm, comfortable Vitals:   06/13/18 1630 06/13/18 1645 06/13/18 1800 06/13/18 1830  BP: 127/79     Pulse: 79 79 76 77  Resp: 20 17 19 18   Temp:      TempSrc:      SpO2: 96% 96% 97% 93%  Weight:      Height:       Eyes: PERRL, lids and conjunctivae normal ENMT: Mucous membranes are moist. Posterior pharynx clear of any exudate or lesions. Neck: normal, supple, no masses, no thyromegaly Respiratory: clear to auscultation bilaterally, reduced breath sounds on the left. Normal respiratory effort.  Cardiovascular: Regular rate and rhythm, no murmurs / rubs / gallops. No extremity edema. 2+ pedal pulses.  Abdomen: no tenderness, no masses palpated. No hepatosplenomegaly. Bowel sounds positive.  Musculoskeletal: no clubbing / cyanosis. No joint deformity upper and lower extremities. Good ROM, no contractures. Normal muscle tone.  Skin: no rashes, lesions, ulcers. No induration Neurologic: CN 2-12 grossly intact. Sensation intact, DTR normal. Strength 5/5 in all 4.  Psychiatric: Normal judgment and insight. Alert and oriented x 3. Normal mood.   Labs on Admission: I have personally reviewed following labs and imaging studies  CBC: Recent Labs  Lab 06/08/18 0812 06/13/18 1402  WBC 18.2* 12.0*  NEUTROABS 11.8* 6.8  HGB 14.4 13.4  HCT 45.5 42.9  MCV 91.9 91.3  PLT 527* 024*   Basic Metabolic Panel: Recent Labs  Lab 06/08/18 0812 06/13/18 1402  NA 138 136  K 4.1 4.6  CL 105 105  CO2 24 21*  GLUCOSE 103* 109*  BUN 20 26*  CREATININE 1.01 0.98  CALCIUM 8.6* 8.3*   Liver Function Tests: Recent Labs  Lab  06/08/18 0812 06/13/18 1402  AST 13* 30  ALT 12 7  ALKPHOS 49 47  BILITOT 0.6 1.2  PROT 8.4* 8.0  ALBUMIN 2.8* 2.5*   Cardiac Enzymes: Recent  Labs  Lab 06/13/18 1402  TROPONINI <0.03   Urine analysis:    Component Value Date/Time   COLORURINE AMBER (A) 06/13/2018 1431   APPEARANCEUR CLEAR 06/13/2018 1431   LABSPEC 1.025 06/13/2018 1431   PHURINE 5.0 06/13/2018 1431   GLUCOSEU NEGATIVE 06/13/2018 1431   HGBUR NEGATIVE 06/13/2018 1431   BILIRUBINUR NEGATIVE 06/13/2018 1431   KETONESUR NEGATIVE 06/13/2018 1431   PROTEINUR NEGATIVE 06/13/2018 1431   NITRITE NEGATIVE 06/13/2018 1431   LEUKOCYTESUR NEGATIVE 06/13/2018 1431    Radiological Exams on Admission: Ct Angio Chest Pe W And/or Wo Contrast  Result Date: 06/13/2018 CLINICAL DATA:  Chest pain, rule out pulmonary embolism. History of lung cancer treated with immune therapy EXAM: CT ANGIOGRAPHY CHEST WITH CONTRAST TECHNIQUE: Multidetector CT imaging of the chest was performed using the standard protocol during bolus administration of intravenous contrast. Multiplanar CT image reconstructions and MIPs were obtained to evaluate the vascular anatomy. CONTRAST:  104mL OMNIPAQUE IOHEXOL 350 MG/ML SOLN COMPARISON:  Chest x-ray 06/13/2018.  PET 02/15/2018 FINDINGS: Cardiovascular: Negative for pulmonary embolism. Atherosclerotic aortic arch without aneurysm. Heart size normal. No pericardial effusion Mediastinum/Nodes: Right hilar adenopathy. Multiple enlarged lymph nodes in the right hilum measuring up to 2 cm each. Anterior mediastinal mass 38 x 50 mm not present on the prior PET. This is concerning for recurrent tumor. Anterior mediastinal lymph node 10 mm. Peri carina lymph node 15 mm on the left. Subcarinal lymph node 14 mm. Lungs/Pleura: Moderately large loculated left effusion. Extensive airspace disease in the left lung could represent pneumonia however given history of carcinoma, close imaging follow-up is recommended. Severe  emphysema. Right upper lobe density is unchanged may represent scarring. No pleural effusion on the right. Upper Abdomen: Interval development of new liver lesions compatible with metastatic disease. 26 x 14 mm lesion in the posterior right lobe liver. 42 x 25 mm lesion in the medial caudal liver. Central liver lesion positive on PET is difficult to see on the current study. Calcified gallstone. Musculoskeletal: Chronic compression fracture approximately T11. No acute skeletal abnormality. Review of the MIP images confirms the above findings. IMPRESSION: 1. Negative for pulmonary embolism 2. Loculated left pleural effusion with extensive airspace disease on the left likely pneumonia. Given history of lung cancer close follow-up recommended 3. Right hilar adenopathy, showing increased uptake on PET. Peri carina lymph nodes also showing uptake on PET. 4. Anterior mediastinal mass is new and suspicious for recurrent tumor 5. New liver lesions compatible with metastatic disease. Electronically Signed   By: Franchot Gallo M.D.   On: 06/13/2018 17:23   Dg Chest Port 1 View  Result Date: 06/13/2018 CLINICAL DATA:  Chest pain. Patient has history of stage IV lung and liver cancer. EXAM: PORTABLE CHEST 1 VIEW COMPARISON:  April 23, 2018 FINDINGS: Significant opacification of the left hemithorax is a new finding. Stable right Port-A-Cath. No change in the cardiomediastinal silhouette. Chronic emphysematous changes are again seen on the right. No suspicious nodules or masses on the right. IMPRESSION: 1. Widespread opacification in the left hemithorax is a new in the interval and nonspecific. If the patient has signs of infection, widespread pneumonia should be considered. Electronically Signed   By: Dorise Bullion III M.D   On: 06/13/2018 14:25    EKG: Independently reviewed.  Sinus rhythm, QTC 439.  No ST or T wave abnormalities compared to prior.  Assessment/Plan Active Problems:   Pneumonia   Pneumonia with  loculated pleural effusion- dyspnea, productive cough, left chest pain.  Rules  out for sepsis.  WBC- 12. Lactic acid 0.8.  O2 sats >  90% on home 3 L O2.  SARs COVID test negative.  Pattern of pneumonia not consistent with COVID, no viral prodrome, no sick contacts.  CTA chest negative for PE, shows extensive airspace disease on the left with loculated pleural effusion. -Considering extent of pneumonia, loculated effusion and immunocompromised status, will continue broad-spectrum antibiotics and add atypical coverage- IV vancomycin and Zosyn, azithromycin. -CBC, BMP a.m. -Follow-up blood cultures - [facility] -Talked to Dr. Jimmy Footman on call for PCCM, recommended consulting CVTS first. - Please consult CVTS in a.m - PT-INR - NPO midnight -IV morphine 2 mg every 4 hourly as needed - IV Ns 100cc/hr x 15 hrs  Stage IV non-small cell lung cancer-adenocarcinoma, stage IV.  Diagnosed 12/2017.  CT suggests new as suspicious for recurrent tumor, and new liver lesions compatible with metastatic disease..  Follows with Dr. Delton Coombes.  Currently on chemotherapy.  COPD-stable. -Continue home bronchodilators  HTN-stable. -Continue home Norvasc and metoprolol - Hold Lisinopril for contrast exposure  Coronary artery disease- Stable. EKG unchanged.  Troponin x1 negative. - Hold aspirin pending CVTS eval.  Preadmission Screening SARS-CoV-2 test negative  DVT prophylaxis: SCDS Code Status: Full Family Communication: None at bedside Disposition Plan: Per rounding team Consults called: Pulmonology. Please consult CVTS in a.m Admission status: Inpt, tele I certify that at the point of admission it is my clinical judgment that the patient will require inpatient hospital care spanning beyond 2 midnights from the point of admission due to high intensity of service, high risk for further deterioration and high frequency of surveillance required. The following factors support the patient status of  inpatient: Extensive pneumonia requiring IV antibiotics in immunocompromised patient, with loculated pleural effusion likely requiring chest tube placement for drainage.   Bethena Roys MD [facility]  06/13/2018, 8:24 PM

## 2018-06-13 NOTE — ED Provider Notes (Signed)
[facility] EMERGENCY DEPARTMENT Provider Note   CSN: [id number] Arrival date & time: 06/13/18  1316    History   Chief Complaint Chief Complaint  Patient presents with   Chest Pain    HPI Bradley [name] is a 78 y.o. male.     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  Initially reluctant to start chemotherapy he has recently started and has gone through several rounds most recently on May 12.  In January 2020 he had a PET scan which showed a dominant left perihilar lung mass as well as a lesion in the dome of the liver.  Biopsy of the liver lesion in February showed poorly differentiated carcinoma.  The patient does have some history of COPD and uses oxygen at night.  He has complained of having some coughing and shortness of breath for quite some time noted in his oncology notes as well.  He is often productive of phlegm and states that the left-sided chest pain which he is having has worsened since 9 May.  It is definitely worse with deep breathing and coughing, he feels like it is on the inside, sharp and stabbing and not associated with fevers or swelling of the legs.  He denies any history of venous thromboembolism although he has had a distant history of myocardial infarction, last heart catheterization was in 2018.  That catheterization showed mild nonobstructive coronary disease with an ejection fraction of 45 to 50%, echocardiogram repeated in October 2019, showed ejection fraction of 55 to 60% with grade 1 diastolic dysfunction.  He is not currently taking any anticoagulants.  Past Medical History:  Diagnosis Date   Cancer ([place])    lung   Chronic lower back pain    Chronic pain    pain management   Contact with stonefish as cause of accidental injury 1954   "stung across my left hand in [place]; LUE weaker since"   COPD (chronic  obstructive pulmonary disease) ([place])    Coronary artery disease    Dyspnea    with exertion   High cholesterol    History of blood transfusion 1943   History of kidney stones    passed   Hypertension    Mass of left lung    Migraine    "none since the 1990s" (09/11/2016)   Myocardial infarction ([place]) 01/20/1993; 02/28/1993   Neuromuscular disorder ([place])    neuropathy left   Pneumonia    "several times" (09/11/2016)   Tobacco abuse     Patient Active Problem List   Diagnosis Date Noted   Acute maxillary sinusitis 12/30/2017   Palliative care by specialist    Goals of care, counseling/discussion    Malignant neoplasm of left lung ([place])    Left leg weakness    Nosocomial pneumonia 12/29/2017   Non-small cell carcinoma of left lung ([place]) 12/29/2017   Mass of lower lobe of left lung 12/10/2017   Chest pain in adult    Syncope and collapse    Rt Hip fracture ([place]) 12/19/2016   Chest pain 09/12/2016   Leukocytosis 09/12/2016   Abnormal chest x-ray 09/12/2016   Hyperlipidemia 09/12/2016   Hypokalemia 09/12/2016   Precordial pain    Tobacco abuse 09/11/2016   Emphysema, unspecified ([place]) 09/11/2016   CAD (coronary artery disease), native coronary artery 09/11/2016   Chronic pain syndrome 09/11/2016   Essential  hypertension 09/11/2016   Right-sided epistaxis 03/15/2016    Past Surgical History:  Procedure Laterality Date   APPENDECTOMY     CARDIAC CATHETERIZATION     CATARACT EXTRACTION, BILATERAL Bilateral    COLONOSCOPY     HIP ARTHROPLASTY Right 12/20/2016   Procedure: ARTHROPLASTY BIPOLAR HIP (HEMIARTHROPLASTY);  Surgeon: [name], MD;  Location: [place];  Service: Orthopedics;  Laterality: Right;   JOINT REPLACEMENT     LEFT HEART CATH AND CORONARY ANGIOGRAPHY N/A 09/12/2016   Procedure: LEFT HEART CATH AND CORONARY ANGIOGRAPHY;  Surgeon: Burnell Blanks, MD;  Location: [place] [facility];  Service: Cardiovascular;   Laterality: N/A;   NASAL ENDOSCOPY WITH EPISTAXIS CONTROL Right 03/15/2016   Procedure: NASAL ENDOSCOPY WITH EPISTAXIS CONTROL;  Surgeon: Jodi Marble, MD;  Location: [place];  Service: ENT;  Laterality: Right;   PORTACATH PLACEMENT Right 04/23/2018   Procedure: INSERTION PORT-A-CATH;  Surgeon: Aviva Signs, MD;  Location: [facility];  Service: General;  Laterality: Right;   SEPTOPLASTY Right 03/15/2016   Procedure: SEPTOPLASTY;  Surgeon: Jodi Marble, MD;  Location[facility];  Service: ENT;  Laterality: Right;   SINUS EXPLORATION Right 03/15/2016   Procedure: SINUS EXPLORATION;  Surgeon: Jodi Marble, MD;  Location: [place];  Service: ENT;  Laterality: Right;   TONSILLECTOMY     TOOTH EXTRACTION     TOTAL KNEE ARTHROPLASTY Left    VIDEO BRONCHOSCOPY WITH ENDOBRONCHIAL ULTRASOUND N/A 12/15/2017   Procedure: VIDEO BRONCHOSCOPY WITH ENDOBRONCHIAL ULTRASOUND;  Surgeon: Rigoberto Noel, MD;  Location: [place];  Service: Thoracic;  Laterality: N/A;        Home Medications    Prior to Admission medications   Medication Sig Start Date End Date Taking? Authorizing Provider  ADVAIR HFA 115-21 MCG/ACT inhaler Inhale 2 puffs into the lungs every 12 (twelve) hours. 01/29/16   [provider]  amLODipine (NORVASC) 5 MG tablet TAKE 1 TABLET BY MOUTH EVERY DAY Patient taking differently: Take 5 mg by mouth daily.  08/24/17   Lendon Colonel, NP  Ascorbic Acid (VITAMIN C) 500 MG CAPS Take 500 mg by mouth daily.    [provider]  aspirin EC 81 MG tablet Take 81 mg by mouth daily.     [provider]  buPROPion (WELLBUTRIN SR) 150 MG 12 hr tablet Take 1 tablet (150 mg total) by mouth 2 (two) times daily. Take 1 tablet daily for 1 week, stop smoking, then take twice daily after first week. Patient not taking: Reported on 03/17/2018 12/09/17 02/18/18  Fenton Foy, NP  celecoxib (CELEBREX) 200 MG capsule Take 1 capsule (200 mg total) by mouth daily. 12/22/16   [name], MD    cyclobenzaprine (FLEXERIL) 5 MG tablet Take 5 mg by mouth 2 (two) times daily.     [provider]  Ferrous Sulfate (IRON) 325 (65 Fe) MG TABS Take 1 tablet by mouth daily. 12/10/15   [provider]  fluticasone (FLONASE) 50 MCG/ACT nasal spray Place 2 sprays into both nostrils daily. 12/11/17   [provider]  gabapentin (NEURONTIN) 300 MG capsule Take 1 capsule by mouth 3 (three) times daily. 08/15/16   [provider]  Ipilimumab (YERVOY IV) Inject into the vein every 6 (six) weeks.    [provider]  ipratropium-albuterol (DUONEB) 0.5-2.5 (3) MG/3ML SOLN Take 3 mLs by nebulization 3 (three) times daily. 01/01/18   Johnson, Clanford L, MD  lidocaine-prilocaine (EMLA) cream Apply 1 application topically as needed. Apply  a small amount to port site and cover with plastic wrap 1 hour prior to infusion appointments 04/23/18   Derek Jack, MD  lisinopril (PRINIVIL,ZESTRIL) 20 MG tablet Take 0.5 tablets (10 mg total) by mouth daily. Patient taking differently: Take 20 mg by mouth daily.  01/01/18   Johnson, Clanford L, MD  metoCLOPramide (REGLAN) 5 MG tablet Take 1 tablet by mouth 4 (four) times daily -  before meals and at bedtime. 01/25/16   [provider]  metoprolol succinate (TOPROL-XL) 25 MG 24 hr tablet Take 25 mg by mouth daily.    [provider]  mirabegron ER (MYRBETRIQ) 25 MG TB24 tablet Take 25 mg by mouth at bedtime.     [provider]  Multiple Vitamins-Minerals (MULTIVITAMIN WITH MINERALS) tablet Take 1 tablet by mouth daily.    [provider]  nitroGLYCERIN (NITROSTAT) 0.4 MG SL tablet Place 0.4 mg under the tongue every 5 (five) minutes as needed for chest pain.    [provider]  Nivolumab (OPDIVO IV) Inject into the vein every 14 (fourteen) days.    [provider]  omeprazole (PRILOSEC) 40 MG capsule Take 40 mg by mouth daily.     [provider]  oxyCODONE (OXY  IR/ROXICODONE) 5 MG immediate release tablet Take 1 tablet (5 mg total) by mouth every 4 (four) hours as needed for severe pain. 04/23/18   Aviva Signs, MD  potassium gluconate (HM POTASSIUM) 595 (99 K) MG TABS tablet Take 595 mg by mouth daily.    [provider]  simvastatin (ZOCOR) 10 MG tablet Take 10 mg by mouth at bedtime.  12/23/15   [provider]  VENTOLIN HFA 108 (90 Base) MCG/ACT inhaler Inhale 2 puffs into the lungs every 4 (four) hours as needed for wheezing or shortness of breath.  12/11/15   [provider]    Family History Family History  Adopted: Yes  Problem Relation Age of Onset   Cancer Mother    Obesity Father     Social History Social History   Tobacco Use   Smoking status: Current Every Day Smoker    Packs/day: 1.00    Years: 70.00    Pack years: 70.00    Types: Cigarettes   Smokeless tobacco: Never Used   Tobacco comment: smokes 8 a day  Substance Use Topics   Alcohol use: No    Comment: 11/26/2017  "drank from the age of 13 til 03/05/1993"   Drug use: Not Currently    Comment: 11/26/2017 "used some drugs when I was young"     Allergies   Aspirin   Review of Systems Review of Systems  All other systems reviewed and are negative.    Physical Exam Updated Vital Signs BP (!) 152/87 (BP Location: Right Arm)    Pulse 85    Temp 97.9 F (36.6 C) (Oral)    Resp (!) 22    Ht 1.829 m (6')    Wt 71 kg    BMI 21.23 kg/m   Physical Exam Vitals signs and nursing note reviewed.  Constitutional:      General: He is not in acute distress.    Appearance: He is well-developed.  HENT:     Head: Normocephalic and atraumatic.     Mouth/Throat:     Pharynx: No oropharyngeal exudate.  Eyes:     General: No scleral icterus.       Right eye: No discharge.        Left  eye: No discharge.     Conjunctiva/sclera: Conjunctivae normal.     Pupils: Pupils are equal, round, and reactive to light.  Neck:     Musculoskeletal:  Normal range of motion and neck supple.     Thyroid: No thyromegaly.     Vascular: No JVD.  Cardiovascular:     Rate and Rhythm: Normal rate and regular rhythm.     Heart sounds: Normal heart sounds. No murmur. No friction rub. No gallop.   Pulmonary:     Effort: Pulmonary effort is normal. No respiratory distress.     Breath sounds: Rales present. No wheezing.     Comments: There are breath sounds which are equal bilaterally however there is rales at the bilateral bases.  He speaks in full sentences without distress or tachypnea or increased work of breathing, oxygen levels between 91 and 94% Abdominal:     General: Bowel sounds are normal. There is no distension.     Palpations: Abdomen is soft. There is no mass.     Tenderness: There is no abdominal tenderness.  Musculoskeletal: Normal range of motion.        General: No tenderness.  Lymphadenopathy:     Cervical: No cervical adenopathy.  Skin:    General: Skin is warm and dry.     Findings: No erythema or rash.  Neurological:     Mental Status: He is alert.     Coordination: Coordination normal.  Psychiatric:        Behavior: Behavior normal.      ED Treatments / Results  Labs (all labs ordered are listed, but only abnormal results are displayed) Labs Reviewed - No data to display  EKG EKG Interpretation  Date/Time:  Sunday Jun 13 2018 13:33:18 EDT Ventricular Rate:  89 PR Interval:    QRS Duration: 96 QT Interval:  372 QTC Calculation: 453 R Axis:   44 Text Interpretation:  Sinus rhythm since last tracing no significant change Confirmed by Noemi Chapel [phone]) on 06/13/2018 1:52:02 PM   Radiology Ct Angio Chest Pe W And/or Wo Contrast  Result Date: 06/13/2018 CLINICAL DATA:  Chest pain, rule out pulmonary embolism. History of lung cancer treated with immune therapy EXAM: CT ANGIOGRAPHY CHEST WITH CONTRAST TECHNIQUE: Multidetector CT imaging of the chest was performed using the standard protocol during bolus  administration of intravenous contrast. Multiplanar CT image reconstructions and MIPs were obtained to evaluate the vascular anatomy. CONTRAST:  25mL OMNIPAQUE IOHEXOL 350 MG/ML SOLN COMPARISON:  Chest x-ray 06/13/2018.  PET 02/15/2018 FINDINGS: Cardiovascular: Negative for pulmonary embolism. Atherosclerotic aortic arch without aneurysm. Heart size normal. No pericardial effusion Mediastinum/Nodes: Right hilar adenopathy. Multiple enlarged lymph nodes in the right hilum measuring up to 2 cm each. Anterior mediastinal mass 38 x 50 mm not present on the prior PET. This is concerning for recurrent tumor. Anterior mediastinal lymph node 10 mm. Peri carina lymph node 15 mm on the left. Subcarinal lymph node 14 mm. Lungs/Pleura: Moderately large loculated left effusion. Extensive airspace disease in the left lung could represent pneumonia however given history of carcinoma, close imaging follow-up is recommended. Severe emphysema. Right upper lobe density is unchanged may represent scarring. No pleural effusion on the right. Upper Abdomen: Interval development of new liver lesions compatible with metastatic disease. 26 x 14 mm lesion in the posterior right lobe liver. 42 x 25 mm lesion in the medial caudal liver. Central liver lesion positive on PET is difficult to see on the current study. Calcified gallstone.  Musculoskeletal: Chronic compression fracture approximately T11. No acute skeletal abnormality. Review of the MIP images confirms the above findings. IMPRESSION: 1. Negative for pulmonary embolism 2. Loculated left pleural effusion with extensive airspace disease on the left likely pneumonia. Given history of lung cancer close follow-up recommended 3. Right hilar adenopathy, showing increased uptake on PET. Peri carina lymph nodes also showing uptake on PET. 4. Anterior mediastinal mass is new and suspicious for recurrent tumor 5. New liver lesions compatible with metastatic disease. Electronically Signed   By:  Franchot Gallo M.D.   On: 06/13/2018 17:23   Dg Chest Port 1 View  Result Date: 06/13/2018 CLINICAL DATA:  Chest pain. Patient has history of stage IV lung and liver cancer. EXAM: PORTABLE CHEST 1 VIEW COMPARISON:  April 23, 2018 FINDINGS: Significant opacification of the left hemithorax is a new finding. Stable right Port-A-Cath. No change in the cardiomediastinal silhouette. Chronic emphysematous changes are again seen on the right. No suspicious nodules or masses on the right. IMPRESSION: 1. Widespread opacification in the left hemithorax is a new in the interval and nonspecific. If the patient has signs of infection, widespread pneumonia should be considered. Electronically Signed   By: Dorise Bullion III M.D   On: 06/13/2018 14:25    Procedures .Critical Care Performed by: Noemi Chapel, MD Authorized by: Noemi Chapel, MD   Critical care provider statement:    Critical care time (minutes):  35   Critical care time was exclusive of:  Separately billable procedures and treating other patients and teaching time   Critical care was necessary to treat or prevent imminent or life-threatening deterioration of the following conditions:  Respiratory failure   Critical care was time spent personally by me on the following activities:  Blood draw for specimens, development of treatment plan with patient or surrogate, discussions with consultants, evaluation of patient's response to treatment, examination of patient, obtaining history from patient or surrogate, ordering and performing treatments and interventions, ordering and review of laboratory studies, ordering and review of radiographic studies, pulse oximetry, re-evaluation of patient's condition and review of old charts   (including critical care time)  Medications Ordered in ED Medications  morphine 4 MG/ML injection 4 mg (4 mg Intravenous Given 06/13/18 1402)  piperacillin-tazobactam (ZOSYN) IVPB 3.375 g ( Intravenous Stopped 06/13/18 1605)    vancomycin (VANCOCIN) IVPB 1000 mg/200 mL premix ( Intravenous Stopped 06/13/18 1631)  iohexol (OMNIPAQUE) 350 MG/ML injection 75 mL (75 mLs Intravenous Contrast Given 06/13/18 1656)     Initial Impression / Assessment and Plan / ED Course  I have reviewed the triage vital signs and the nursing notes.  Pertinent labs & imaging results that were available during my care of the patient were reviewed by me and considered in my medical decision making (see chart for details).      The EKG is unremarkable, there is no signs of ischemia or arrhythmia.  He does have rales in his lungs which raises the suspicion for either pulmonary edema or pneumonia or possibly some interstitial fibrosis.  Will obtain a chest x-ray, labs, rule out pulmonary embolism and PTX as well given his underlying cancer history.  Patient agreeable  I have personally viewed the patient's chest x-ray, there does appear to be significant opacification in the left chest.  This was followed with a CT scan which shows a large pleural effusion with extensive consolidation of the left lung consistent with pneumonia as well.  The patient continues to have discomfort but his vital  signs are reassuring, his leukocytosis is an isolated lab abnormality.  I discussed his care with the hospitalist Dr. Denton Brick who will admit.  Antibiotics given in the emergency department including both Zosyn and vancomycin.  As the patient is immunocompromised  Final Clinical Impressions(s) / ED Diagnoses   Final diagnoses:  Pneumonia of left lung due to infectious organism, unspecified part of lung  Pleural effusion, left     Noemi Chapel, MD 06/13/18 [phone]

## 2018-06-13 NOTE — ED Notes (Signed)
Barbara with [facility] received report, ETA 15 min

## 2018-06-13 NOTE — ED Notes (Signed)
Patient transported to CT 

## 2018-06-13 NOTE — Progress Notes (Signed)
Pharmacy Antibiotic Note  Bradley [name] is a 78 y.o. male admitted on 06/13/2018 with pneumonia.  Pharmacy has been consulted for zosyn and vancomycin dosing.  Plan: Vancomycin 1000mg  IV every 12 hours.  Goal trough 15-20 mcg/mL. Zosyn 3.375g IV q8h (4 hour infusion).  Height: 6' (182.9 cm) Weight: 156 lb 8.4 oz (71 kg) IBW/kg (Calculated) : 77.6  Temp (24hrs), Avg:97.9 F (36.6 C), Min:97.9 F (36.6 C), Max:97.9 F (36.6 C)  Recent Labs  Lab 06/08/18 0812 06/13/18 1402 06/13/18 1458 06/13/18 1734  WBC 18.2* 12.0*  --   --   CREATININE 1.01 0.98  --   --   LATICACIDVEN  --   --  0.8 1.2    Estimated Creatinine Clearance: 63.4 mL/min (by C-G formula based on SCr of 0.98 mg/dL).    Allergies  Allergen Reactions  . Aspirin     Regular asa     Antimicrobials this admission: 5/17 vancomycin >>  5/17 zosyn >>   Microbiology results: 5/17 BCx: sent 5/17 MRSA PCR: sent 5/17 covid 19 sent   Thank you for allowing pharmacy to be a part of this patient's care.  Donna Christen Ellijah Leffel 06/13/2018 7:07 PM

## 2018-06-13 NOTE — ED Notes (Signed)
Admitting MD in room.

## 2018-06-13 NOTE — ED Triage Notes (Addendum)
Pt reports that on the 06/05/18 he was sitting on his couch and he began having chest pain under left ribs. Pt has stage 4 lung and liver cancer and receives treatment via porta cath every 2 weeks. Pt was told to take it easy by his doctor . Pt reports increase SOB and pain with coughing

## 2018-06-14 DIAGNOSIS — J90 Pleural effusion, not elsewhere classified: Secondary | ICD-10-CM

## 2018-06-14 LAB — BASIC METABOLIC PANEL
Anion gap: 7 (ref 5–15)
BUN: 20 mg/dL (ref 8–23)
CO2: 24 mmol/L (ref 22–32)
Calcium: 8.1 mg/dL — ABNORMAL LOW (ref 8.9–10.3)
Chloride: 105 mmol/L (ref 98–111)
Creatinine, Ser: 1.12 mg/dL (ref 0.61–1.24)
GFR calc Af Amer: >60 mL/min (ref >60)
GFR calc non Af Amer: >60 mL/min (ref >60)
Glucose, Bld: 90 mg/dL (ref 70–99)
Potassium: 3.5 mmol/L (ref 3.5–5.1)
Sodium: 136 mmol/L (ref 135–145)

## 2018-06-14 LAB — MRSA PCR SCREENING: MRSA by PCR: NEGATIVE

## 2018-06-14 LAB — CBC
HCT: 38.2 % — ABNORMAL LOW (ref 39.0–52.0)
Hemoglobin: 12.3 g/dL — ABNORMAL LOW (ref 13.0–17.0)
MCH: 29.1 pg (ref 26.0–34.0)
MCHC: 32.2 g/dL (ref 30.0–36.0)
MCV: 90.5 fL (ref 80.0–100.0)
Platelets: 492 10*3/uL — ABNORMAL HIGH (ref 150–400)
RBC: 4.22 MIL/uL (ref 4.22–5.81)
RDW: 17.2 % — ABNORMAL HIGH (ref 11.5–15.5)
WBC: 13.7 10*3/uL — ABNORMAL HIGH (ref 4.0–10.5)
nRBC: 0 % (ref 0.0–0.2)

## 2018-06-14 MED ORDER — VANCOMYCIN HCL 10 G IV SOLR
1250.0000 mg | INTRAVENOUS | Status: DC
  Filled 2018-06-14: qty 1250

## 2018-06-14 MED ORDER — CELECOXIB 200 MG PO CAPS
200.0000 mg | ORAL_CAPSULE | Freq: Every day | ORAL | Status: DC
  Administered 2018-06-14 – 2018-06-18 (×5): 200 mg via ORAL
  Filled 2018-06-14 (×5): qty 1

## 2018-06-14 MED ORDER — ASPIRIN EC 81 MG PO TBEC
81.0000 mg | DELAYED_RELEASE_TABLET | Freq: Every day | ORAL | Status: DC
  Administered 2018-06-14 – 2018-06-18 (×5): 81 mg via ORAL
  Filled 2018-06-14 (×5): qty 1

## 2018-06-14 MED ORDER — ENOXAPARIN SODIUM 40 MG/0.4ML ~~LOC~~ SOLN
40.0000 mg | SUBCUTANEOUS | Status: DC
  Administered 2018-06-16 – 2018-06-17 (×2): 40 mg via SUBCUTANEOUS
  Filled 2018-06-14 (×2): qty 0.4

## 2018-06-14 MED ORDER — SIMVASTATIN 20 MG PO TABS
10.0000 mg | ORAL_TABLET | Freq: Every day | ORAL | Status: DC
  Administered 2018-06-14 – 2018-06-17 (×4): 10 mg via ORAL
  Filled 2018-06-14 (×4): qty 1

## 2018-06-14 MED ORDER — MIRABEGRON ER 25 MG PO TB24
25.0000 mg | ORAL_TABLET | Freq: Every day | ORAL | Status: DC
  Administered 2018-06-14 – 2018-06-17 (×4): 25 mg via ORAL
  Filled 2018-06-14 (×5): qty 1

## 2018-06-14 MED ORDER — CYCLOBENZAPRINE HCL 5 MG PO TABS
5.0000 mg | ORAL_TABLET | Freq: Two times a day (BID) | ORAL | Status: DC
  Administered 2018-06-14 – 2018-06-18 (×9): 5 mg via ORAL
  Filled 2018-06-14 (×9): qty 1

## 2018-06-14 MED ORDER — FERROUS SULFATE 325 (65 FE) MG PO TABS
325.0000 mg | ORAL_TABLET | Freq: Every day | ORAL | Status: DC
  Administered 2018-06-14 – 2018-06-18 (×5): 325 mg via ORAL
  Filled 2018-06-14 (×5): qty 1

## 2018-06-14 MED ORDER — ENOXAPARIN SODIUM 40 MG/0.4ML ~~LOC~~ SOLN: 40.0000 mg | SUBCUTANEOUS | Status: DC

## 2018-06-14 NOTE — Progress Notes (Signed)
Patient arrived to unit alert and oriented.  Patient able to ambulate from stretcher to bed. Requires 3L of oxygen chronically and O2 sat is currently in mid 90s with continuous pulse ox on.  Skin assessed, no skin issues. Patient oriented to room and equipment, bed left in locked and lowest position with call bell in reach.  Will continue to monitor.

## 2018-06-14 NOTE — Progress Notes (Signed)
PROGRESS NOTE    Bradley [name]  HUD:[id number] DOB: [birth year] DOA: 06/13/2018 PCP: Jani Gravel, MD   Brief Narrative:  Per HPI: Bradley [name] is a 78 y.o. 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  Difficulty breathing and cough are chronic.  Chest pain is left-sided, worse with deep breathing and coughing, progressively worsening over the past week. He denies associated fever or chills, no leg swelling redness or pain.  No sick contacts.  No sore throat, no myalgias.  Patient was transferred from [facility] to [facility] with pneumonia and associated loculated pleural effusion.  Assessment & Plan:   Active Problems:   Pneumonia   Pneumonia with loculated pleural effusion- dyspnea, productive cough, left chest pain.  Rules out for sepsis.  WBC- 12. Lactic acid 0.8.  O2 sats >  90% on home 3 L O2.  SARs COVID test negative.  Pattern of pneumonia not consistent with COVID, no viral prodrome, no sick contacts.  CTA chest negative for PE, shows extensive airspace disease on the left with loculated pleural effusion. -Blood Cx with no growth thus far, will monitor -MRSA negative; will DC Vanc and continue Zosyn and Azithromycin  Stage IV non-small cell lung cancer-adenocarcinoma, stage IV.  Diagnosed 12/2017.  CT suggests new as suspicious for recurrent tumor, and new liver lesions compatible with metastatic disease..  Follows with Dr. Delton Coombes.  Currently on chemotherapy.  COPD-stable. -Continue home bronchodilators  HTN-stable. -Continue home Norvasc and metoprolol - Hold Lisinopril for contrast exposure  Coronary artery disease- Stable. EKG unchanged.  Troponin x1 negative. - May restart aspirin  Preadmission Screening SARS-CoV-2 test  negative   DVT prophylaxis:SCDs Code Status: DNR Family Communication:None at bedside  Disposition Plan: Pigtail catheter placement today; continue Zosyn   Consultants:   CVTS  Procedures:   None  Antimicrobials:   Vancomycin 5/17-5/18  Zosyn 5/17->  Azithromycin 5/17->   Subjective: Patient seen and evaluated today with no new acute complaints or concerns. No acute concerns or events noted overnight. He continues to have L chest wall pain that is ongoing.  Objective: Vitals:   06/13/18 2112 06/14/18 0431 06/14/18 0543 06/14/18 1357  BP: 132/85 (!) 106/46 108/67 126/68  Pulse: 91 75 75 77  Resp: 18 18  (!) 24  Temp: 97.9 F (36.6 C) 97.7 F (36.5 C)  97.9 F (36.6 C)  TempSrc: Oral Oral  Oral  SpO2: 97% 95% 97% 95%  Weight: 70.8 kg     Height: 6' (1.829 m)       Intake/Output Summary (Last 24 hours) at 06/14/2018 1416 Last data filed at 06/14/2018 8588 Gross per 24 hour  Intake 1185.35 ml  Output 400 ml  Net 785.35 ml   Filed Weights   06/13/18 1326 06/13/18 2112  Weight: 71 kg 70.8 kg    Examination:  General exam: Appears calm and comfortable  Respiratory system: Clear to auscultation. Respiratory effort normal. Currently on 3L [place]. Cardiovascular system: S1 & S2 heard, RRR. No JVD, murmurs, rubs, gallops or clicks. No pedal edema. Gastrointestinal system: Abdomen is nondistended, soft and nontender. No organomegaly or masses felt. Normal bowel sounds heard. Central nervous system: Alert and oriented. No focal neurological deficits. Extremities: Symmetric 5 x 5 power. Skin: No rashes, lesions or ulcers Psychiatry: Judgement and insight appear normal. Mood &  affect appropriate.     Data Reviewed: I have personally reviewed following labs and imaging studies  CBC: Recent Labs  Lab 06/08/18 0812 06/13/18 1402 06/14/18 0155  WBC 18.2* 12.0* 13.7*  NEUTROABS 11.8* 6.8  --   HGB 14.4 13.4 12.3*  HCT 45.5 42.9 38.2*  MCV 91.9 91.3 90.5  PLT  527* 537* 185*   Basic Metabolic Panel: Recent Labs  Lab 06/08/18 0812 06/13/18 1402 06/14/18 0155  NA 138 136 136  K 4.1 4.6 3.5  CL 105 105 105  CO2 24 21* 24  GLUCOSE 103* 109* 90  BUN 20 26* 20  CREATININE 1.01 0.98 1.12  CALCIUM 8.6* 8.3* 8.1*   GFR: Estimated Creatinine Clearance: 55.3 mL/min (by C-G formula based on SCr of 1.12 mg/dL). Liver Function Tests: Recent Labs  Lab 06/08/18 0812 06/13/18 1402  AST 13* 30  ALT 12 7  ALKPHOS 49 47  BILITOT 0.6 1.2  PROT 8.4* 8.0  ALBUMIN 2.8* 2.5*   No results for input(s): LIPASE, AMYLASE in the last 168 hours. No results for input(s): AMMONIA in the last 168 hours. Coagulation Profile: No results for input(s): INR, PROTIME in the last 168 hours. Cardiac Enzymes: Recent Labs  Lab 06/13/18 1402  TROPONINI <0.03   BNP (last 3 results) No results for input(s): PROBNP in the last 8760 hours. HbA1C: No results for input(s): HGBA1C in the last 72 hours. CBG: No results for input(s): GLUCAP in the last 168 hours. Lipid Profile: No results for input(s): CHOL, HDL, LDLCALC, TRIG, CHOLHDL, LDLDIRECT in the last 72 hours. Thyroid Function Tests: No results for input(s): TSH, T4TOTAL, FREET4, T3FREE, THYROIDAB in the last 72 hours. Anemia Panel: No results for input(s): VITAMINB12, FOLATE, FERRITIN, TIBC, IRON, RETICCTPCT in the last 72 hours. Sepsis Labs: Recent Labs  Lab 06/13/18 1458 06/13/18 1734  LATICACIDVEN 0.8 1.2    Recent Results (from the past 240 hour(s))  Blood Culture (routine x 2)     Status: None (Preliminary result)   Collection Time: 06/13/18  2:59 PM  Result Value Ref Range Status   Specimen Description BLOOD RIGHT HAND  Final   Special Requests   Final    BOTTLES DRAWN AEROBIC AND ANAEROBIC Blood Culture adequate volume Performed at [facility], [address], [place], [place] [address]    Culture PENDING  Incomplete   Report Status PENDING  Incomplete  SARS Coronavirus 2 (CEPHEID-  Performed in [place] hospital lab), Hosp Order     Status: None   Collection Time: 06/13/18  3:00 PM  Result Value Ref Range Status   SARS Coronavirus 2 NEGATIVE NEGATIVE Final    Comment: (NOTE) If result is NEGATIVE SARS-CoV-2 target nucleic acids are NOT DETECTED. The SARS-CoV-2 RNA is generally detectable in upper and lower  respiratory specimens during the acute phase of infection. The lowest  concentration of SARS-CoV-2 viral copies this assay can detect is 250  copies / mL. A negative result does not preclude SARS-CoV-2 infection  and should not be used as the sole basis for treatment or other  patient management decisions.  A negative result may occur with  improper specimen collection / handling, submission of specimen other  than nasopharyngeal swab, presence of viral mutation(s) within the  areas targeted by this assay, and inadequate number of viral copies  (<250 copies / mL). A negative result must be combined with clinical  observations, patient history, and epidemiological information. If result is POSITIVE SARS-CoV-2 target nucleic acids are DETECTED.  The SARS-CoV-2 RNA is generally detectable in upper and lower  respiratory specimens dur ing the acute phase of infection.  Positive  results are indicative of active infection with SARS-CoV-2.  Clinical  correlation with patient history and other diagnostic information is  necessary to determine patient infection status.  Positive results do  not rule out bacterial infection or co-infection with other viruses. If result is PRESUMPTIVE POSTIVE SARS-CoV-2 nucleic acids MAY BE PRESENT.   A presumptive positive result was obtained on the submitted specimen  and confirmed on repeat testing.  While 2019 novel coronavirus  (SARS-CoV-2) nucleic acids may be present in the submitted sample  additional confirmatory testing may be necessary for epidemiological  and / or clinical management purposes  to differentiate between    SARS-CoV-2 and other Sarbecovirus currently known to infect humans.  If clinically indicated additional testing with an alternate test  methodology [phone]) is advised. The SARS-CoV-2 RNA is generally  detectable in upper and lower respiratory sp ecimens during the acute  phase of infection. The expected result is Negative. Fact Sheet for Patients:  StrictlyIdeas.no Fact Sheet for Healthcare Providers: BankingDealers.co.za This test is not yet approved or cleared by the [place] FDA and has been authorized for detection and/or diagnosis of SARS-CoV-2 by FDA under an Emergency Use Authorization (EUA).  This EUA will remain in effect (meaning this test can be used) for the duration of the COVID-19 declaration under Section 564(b)(1) of the Act, 21 U.S.C. section 360bbb-3(b)(1), unless the authorization is terminated or revoked sooner. Performed at [facility], [address], [place], [place] [address]   Blood Culture (routine x 2)     Status: None (Preliminary result)   Collection Time: 06/13/18  3:00 PM  Result Value Ref Range Status   Specimen Description BLOOD LEFT HAND  Final   Special Requests   Final    Blood Culture adequate volume BOTTLES DRAWN AEROBIC AND ANAEROBIC Performed at [facility], [address] [place], [place] [address]    Culture PENDING  Incomplete   Report Status PENDING  Incomplete  MRSA PCR Screening     Status: None   Collection Time: 06/13/18 11:15 PM  Result Value Ref Range Status   MRSA by PCR NEGATIVE NEGATIVE Final    Comment:        The GeneXpert MRSA Assay (FDA approved for NASAL specimens only), is one component of a comprehensive MRSA colonization surveillance program. It is not intended to diagnose MRSA infection nor to guide or monitor treatment for MRSA infections. Performed at [facility], [place] [address] [place], [place] [address]          Radiology Studies: Ct  Angio Chest Pe W And/or Wo Contrast  Result Date: 06/13/2018 CLINICAL DATA:  Chest pain, rule out pulmonary embolism. History of lung cancer treated with immune therapy EXAM: CT ANGIOGRAPHY CHEST WITH CONTRAST TECHNIQUE: Multidetector CT imaging of the chest was performed using the standard protocol during bolus administration of intravenous contrast. Multiplanar CT image reconstructions and MIPs were obtained to evaluate the vascular anatomy. CONTRAST:  17mL OMNIPAQUE IOHEXOL 350 MG/ML SOLN COMPARISON:  Chest x-ray 06/13/2018.  PET 02/15/2018 FINDINGS: Cardiovascular: Negative for pulmonary embolism. Atherosclerotic aortic arch without aneurysm. Heart size normal. No pericardial effusion Mediastinum/Nodes: Right hilar adenopathy. Multiple enlarged lymph nodes in the right hilum measuring up to 2 cm each. Anterior mediastinal mass 38 x 50 mm not present on the prior PET. This is concerning for recurrent tumor. Anterior mediastinal  lymph node 10 mm. Peri carina lymph node 15 mm on the left. Subcarinal lymph node 14 mm. Lungs/Pleura: Moderately large loculated left effusion. Extensive airspace disease in the left lung could represent pneumonia however given history of carcinoma, close imaging follow-up is recommended. Severe emphysema. Right upper lobe density is unchanged may represent scarring. No pleural effusion on the right. Upper Abdomen: Interval development of new liver lesions compatible with metastatic disease. 26 x 14 mm lesion in the posterior right lobe liver. 42 x 25 mm lesion in the medial caudal liver. Central liver lesion positive on PET is difficult to see on the current study. Calcified gallstone. Musculoskeletal: Chronic compression fracture approximately T11. No acute skeletal abnormality. Review of the MIP images confirms the above findings. IMPRESSION: 1. Negative for pulmonary embolism 2. Loculated left pleural effusion with extensive airspace disease on the left likely pneumonia. Given  history of lung cancer close follow-up recommended 3. Right hilar adenopathy, showing increased uptake on PET. Peri carina lymph nodes also showing uptake on PET. 4. Anterior mediastinal mass is new and suspicious for recurrent tumor 5. New liver lesions compatible with metastatic disease. Electronically Signed   By: Franchot Gallo M.D.   On: 06/13/2018 17:23   Dg Chest Port 1 View  Result Date: 06/13/2018 CLINICAL DATA:  Chest pain. Patient has history of stage IV lung and liver cancer. EXAM: PORTABLE CHEST 1 VIEW COMPARISON:  April 23, 2018 FINDINGS: Significant opacification of the left hemithorax is a new finding. Stable right Port-A-Cath. No change in the cardiomediastinal silhouette. Chronic emphysematous changes are again seen on the right. No suspicious nodules or masses on the right. IMPRESSION: 1. Widespread opacification in the left hemithorax is a new in the interval and nonspecific. If the patient has signs of infection, widespread pneumonia should be considered. Electronically Signed   By: Dorise Bullion III M.D   On: 06/13/2018 14:25        Scheduled Meds:  amLODipine  5 mg Oral Daily   buPROPion  150 mg Oral BID   enoxaparin (LOVENOX) injection  40 mg Subcutaneous Q24H   fluticasone  2 spray Each Nare Daily   gabapentin  300 mg Oral TID   metoprolol succinate  25 mg Oral Daily   montelukast  10 mg Oral QHS   pantoprazole  40 mg Oral Daily   Continuous Infusions:  azithromycin 500 mg (06/13/18 2303)   piperacillin-tazobactam (ZOSYN)  IV 3.375 g (06/14/18 0521)     LOS: 1 day    Time spent: 30 minutes    Glendola Friedhoff Darleen Crocker, DO [facility] Pager [phone]  If 7PM-7AM, please contact night-coverage [email or web address] Password [email or web address] 06/14/2018, 2:16 PM

## 2018-06-14 NOTE — Consult Note (Signed)
[address]       [place] [address]             [phone]        Dimitrius Smouse V[facility]aman Medical Record #[id number] Date of Birth: [birth year]  Referring: No ref. provider found Primary Care: Jani Gravel, MD Primary Cardiologist:Suresh Bronson Ing, MD  Chief Complaint:    Chief Complaint  Patient presents with   Chest Pain  Patient examined, most recent CT scan of chest and previous PFTs personally reviewed  History of Present Illness:     78 year old male current smoker with recent diagnosis of adenocarcinoma of the left lung following biopsy of a left infrahilar mass.  Patient has metastatic disease with liver mets, bilateral mediastinal adenopathy on immunotherapy.  Patient developed left chest wall discomfort, pleuritic type pain and shortness of breath 3 to 4 days ago and was admitted.  CTA showed no evidence of pulmonary embolus but did show large left hilar and left upper lobe masses, mediastinal adenopathy, and a large lateral left pleural effusion.  There is diffuse and fairly dense airspace disease throughout the left lung consistent with pneumonia. The patient has had a productive cough.  He is on home oxygen.  He is currently on iv antibiotics with white count of 14,000.  Patient has been a heavy smoker.  Recent PFTs show severe emphysema with diffusion capacity only 29% of predicted.  He has previous history of alcoholic cardiomyopathy however last ejection fraction was 55% last year.  Patient would benefit from drainage of the pleural effusion.  He is not a candidate for left VATS because of his extensive cancer, poor PFTs, and DNR status.  I have recommended to him a CT-guided pigtail catheter for drainage and testing of the pleural fluid.   Current Activity/ Functional Status: Patient lives fairly independently.  Had recent immunotherapy through a Port-A-Cath 10 days ago.   Zubrod Score: At the time of surgery this patients most  appropriate activity status/level should be described as: []     0    Normal activity, no symptoms []     1    Restricted in physical strenuous activity but ambulatory, able to do out light work []     2    Ambulatory and capable of self care, unable to do work activities, up and about                 more than 50%  Of the time                            [x]     3    Only limited self care, in bed greater than 50% of waking hours []     4    Completely disabled, no self care, confined to bed or chair []     5    Moribund  Past Medical History:  Diagnosis Date   Cancer ([place])    lung   Chronic lower back pain    Chronic pain    pain management   Contact with stonefish as cause of accidental injury 1954   "stung across my left hand in [place]; LUE weaker since"   COPD (chronic obstructive pulmonary disease) ([place])    Coronary artery disease    Dyspnea    with exertion   High cholesterol    History of blood transfusion 1943   History of kidney stones    passed  Hypertension    Mass of left lung    Migraine    "none since the 1990s" (09/11/2016)   Myocardial infarction ([place]) 01/20/1993; 02/28/1993   Neuromuscular disorder ([place])    neuropathy left   Pneumonia    "several times" (09/11/2016)   Tobacco abuse     Past Surgical History:  Procedure Laterality Date   APPENDECTOMY     CARDIAC CATHETERIZATION     CATARACT EXTRACTION, BILATERAL Bilateral    COLONOSCOPY     HIP ARTHROPLASTY Right 12/20/2016   Procedure: ARTHROPLASTY BIPOLAR HIP (HEMIARTHROPLASTY);  Surgeon: [name], MD;  Location: [facility];  Service: Orthopedics;  Laterality: Right;   JOINT REPLACEMENT     LEFT HEART CATH AND CORONARY ANGIOGRAPHY N/A 09/12/2016   Procedure: LEFT HEART CATH AND CORONARY ANGIOGRAPHY;  Surgeon: Burnell Blanks, MD;  Location: [facility];  Service: Cardiovascular;  Laterality: N/A;   NASAL ENDOSCOPY WITH EPISTAXIS CONTROL Right 03/15/2016   Procedure: NASAL  ENDOSCOPY WITH EPISTAXIS CONTROL;  Surgeon: Jodi Marble, MD;  Location: [facility];  Service: ENT;  Laterality: Right;   PORTACATH PLACEMENT Right 04/23/2018   Procedure: INSERTION PORT-A-CATH;  Surgeon: Aviva Signs, MD;  Location: [facility];  Service: General;  Laterality: Right;   SEPTOPLASTY Right 03/15/2016   Procedure: SEPTOPLASTY;  Surgeon: Jodi Marble, MD;  Location: [facility];  Service: ENT;  Laterality: Right;   SINUS EXPLORATION Right 03/15/2016   Procedure: SINUS EXPLORATION;  Surgeon: Jodi Marble, MD;  Location: [facility];  Service: ENT;  Laterality: Right;   TONSILLECTOMY     TOOTH EXTRACTION     TOTAL KNEE ARTHROPLASTY Left    VIDEO BRONCHOSCOPY WITH ENDOBRONCHIAL ULTRASOUND N/A 12/15/2017   Procedure: VIDEO BRONCHOSCOPY WITH ENDOBRONCHIAL ULTRASOUND;  Surgeon: Rigoberto Noel, MD;  Location: [facility];  Service: Thoracic;  Laterality: N/A;    Social History   Tobacco Use  Smoking Status Current Every Day Smoker   Packs/day: 1.00   Years: 70.00   Pack years: 70.00   Types: Cigarettes  Smokeless Tobacco Never Used  Tobacco Comment   smokes 8 a day    Social History   Substance and Sexual Activity  Alcohol Use No   Comment: 11/26/2017  "drank from the age of 38 til 03/05/1993"     Allergies  Allergen Reactions   Aspirin     Regular asa     Current Facility-Administered Medications  Medication Dose Route Frequency Provider Last Rate Last Dose   acetaminophen (TYLENOL) tablet 650 mg  650 mg Oral Q6H PRN Emokpae, Ejiroghene E, MD       Or   acetaminophen (TYLENOL) suppository 650 mg  650 mg Rectal Q6H PRN Emokpae, Ejiroghene E, MD       albuterol (PROVENTIL) (2.5 MG/3ML) 0.083% nebulizer solution 3 mL  3 mL Inhalation Q4H PRN Emokpae, Ejiroghene E, MD       amLODipine (NORVASC) tablet 5 mg  5 mg Oral Daily Emokpae, Ejiroghene E, MD   5 mg at 06/14/18 0846   azithromycin (ZITHROMAX) 500 mg in sodium chloride 0.9 % 250 mL IVPB  500 mg Intravenous Q24H Emokpae,  Ejiroghene E, MD 250 mL/hr at 06/13/18 2303 500 mg at 06/13/18 2303   buPROPion (WELLBUTRIN SR) 12 hr tablet 150 mg  150 mg Oral BID Emokpae, Ejiroghene E, MD   150 mg at 06/14/18 0847   enoxaparin (LOVENOX) injection 40 mg  40 mg Subcutaneous Q24H Emokpae, Ejiroghene E, MD   40 mg at 06/13/18  2152   fluticasone (FLONASE) 50 MCG/ACT nasal spray 2 spray  2 spray Each Nare Daily Emokpae, Ejiroghene E, MD       gabapentin (NEURONTIN) capsule 300 mg  300 mg Oral TID Emokpae, Ejiroghene E, MD   300 mg at 06/14/18 0846   metoprolol succinate (TOPROL-XL) 24 hr tablet 25 mg  25 mg Oral Daily Emokpae, Ejiroghene E, MD   25 mg at 06/14/18 0847   montelukast (SINGULAIR) tablet 10 mg  10 mg Oral QHS Emokpae, Ejiroghene E, MD   10 mg at 06/13/18 2151   morphine 2 MG/ML injection 2 mg  2 mg Intravenous Q4H PRN Emokpae, Ejiroghene E, MD   2 mg at 06/14/18 1024   ondansetron (ZOFRAN) tablet 4 mg  4 mg Oral Q6H PRN Emokpae, Ejiroghene E, MD       Or   ondansetron (ZOFRAN) injection 4 mg  4 mg Intravenous Q6H PRN Emokpae, Ejiroghene E, MD       pantoprazole (PROTONIX) EC tablet 40 mg  40 mg Oral Daily Emokpae, Ejiroghene E, MD   40 mg at 06/14/18 0847   piperacillin-tazobactam (ZOSYN) IVPB 3.375 g  3.375 g Intravenous Q8H Coffee, Garrett, RPH 12.5 mL/hr at 06/14/18 0521 3.375 g at 06/14/18 0521   polyethylene glycol (MIRALAX / GLYCOLAX) packet 17 g  17 g Oral Daily PRN Emokpae, Ejiroghene E, MD       vancomycin (VANCOCIN) 1,250 mg in sodium chloride 0.9 % 250 mL IVPB  1,250 mg Intravenous Q24H Rolla Flatten, [facility]        Medications Prior to Admission  Medication Sig Dispense Refill Last Dose   ADVAIR HFA 115-21 MCG/ACT inhaler Inhale 2 puffs into the lungs every 12 (twelve) hours.  4 06/13/2018 at Unknown time   amLODipine (NORVASC) 5 MG tablet TAKE 1 TABLET BY MOUTH EVERY DAY (Patient taking differently: Take 5 mg by mouth daily. ) 90 tablet 1 06/13/2018 at Unknown time   Ascorbic Acid  (VITAMIN C) 500 MG CAPS Take 500 mg by mouth daily.   06/13/2018 at Unknown time   aspirin EC 81 MG tablet Take 81 mg by mouth daily.    06/13/2018 at 700   buPROPion (WELLBUTRIN SR) 150 MG 12 hr tablet Take 1 tablet (150 mg total) by mouth 2 (two) times daily. Take 1 tablet daily for 1 week, stop smoking, then take twice daily after first week. 60 tablet 0 06/13/2018 at Unknown time   celecoxib (CELEBREX) 200 MG capsule Take 1 capsule (200 mg total) by mouth daily. 30 capsule 1 06/13/2018 at Unknown time   cyclobenzaprine (FLEXERIL) 5 MG tablet Take 5 mg by mouth 2 (two) times daily.    06/13/2018 at Unknown time   Ferrous Sulfate (IRON) 325 (65 Fe) MG TABS Take 1 tablet by mouth daily.   06/13/2018 at Unknown time   fluticasone (FLONASE) 50 MCG/ACT nasal spray Place 2 sprays into both nostrils daily.  6 06/12/2018 at Unknown time   gabapentin (NEURONTIN) 300 MG capsule Take 1 capsule by mouth 3 (three) times daily.  2 06/13/2018 at Unknown time   Ipilimumab (YERVOY IV) Inject into the vein every 6 (six) weeks.   Taking   ipratropium-albuterol (DUONEB) 0.5-2.5 (3) MG/3ML SOLN Take 3 mLs by nebulization 3 (three) times daily. 360 mL  06/12/2018 at Unknown time   lidocaine-prilocaine (EMLA) cream Apply 1 application topically as needed. Apply a small amount to port site and cover with plastic wrap 1 hour prior to infusion  appointments 30 g 3 Taking   lisinopril (PRINIVIL,ZESTRIL) 20 MG tablet Take 0.5 tablets (10 mg total) by mouth daily. (Patient taking differently: Take 20 mg by mouth daily. ) 30 tablet 0 06/13/2018 at Unknown time   metoCLOPramide (REGLAN) 5 MG tablet Take 1 tablet by mouth 4 (four) times daily -  before meals and at bedtime.  5 06/13/2018 at Unknown time   metoprolol succinate (TOPROL-XL) 25 MG 24 hr tablet Take 25 mg by mouth daily.   06/13/2018 at 700   mirabegron ER (MYRBETRIQ) 25 MG TB24 tablet Take 25 mg by mouth at bedtime.    06/12/2018 at Unknown time   montelukast  (SINGULAIR) 10 MG tablet Take 1 tablet by mouth daily.   06/12/2018 at Unknown time   Multiple Vitamins-Minerals (MULTIVITAMIN WITH MINERALS) tablet Take 1 tablet by mouth daily.   06/13/2018 at Unknown time   nitroGLYCERIN (NITROSTAT) 0.4 MG SL tablet Place 0.4 mg under the tongue every 5 (five) minutes as needed for chest pain.   Taking   Nivolumab (OPDIVO IV) Inject into the vein every 14 (fourteen) days.   Taking   omeprazole (PRILOSEC) 40 MG capsule Take 40 mg by mouth daily.    06/13/2018 at Unknown time   oxyCODONE (OXY IR/ROXICODONE) 5 MG immediate release tablet Take 1 tablet (5 mg total) by mouth every 4 (four) hours as needed for severe pain. 20 tablet 0 06/13/2018 at Unknown time   potassium gluconate (HM POTASSIUM) 595 (99 K) MG TABS tablet Take 595 mg by mouth daily.   06/13/2018 at Unknown time   simvastatin (ZOCOR) 10 MG tablet Take 10 mg by mouth at bedtime.   3 06/12/2018 at Unknown time   VENTOLIN HFA 108 (90 Base) MCG/ACT inhaler Inhale 2 puffs into the lungs every 4 (four) hours as needed for wheezing or shortness of breath.   3 Taking    Family History  Adopted: Yes  Problem Relation Age of Onset   Cancer Mother    Obesity Father      Review of Systems:   ROS No recent history of thoracic trauma.    Cardiac Review of Systems: Y or  [    ]= no  Chest Pain [ y   ]  Resting SOB [   ] Exertional SOB  Blue.Reese  ]  Orthopnea [  ]   Pedal Edema [   ]    Palpitations [  ] Syncope  [  ]   Presyncope [   ]  General Review of Systems: [Y] = yes [  ]=no Constitional: recent weight change Blue.Reese  ]; anorexia [  ]; fatigue [  ]; nausea [  ]; night sweats [  ]; fever [  ]; or chills [  ]                                                               Dental: Last Dentist visit: 1 year  Eye : blurred vision [  ]; diplopia [   ]; vision changes [  ];  Amaurosis fugax[  ]; Resp: cough [ y ];  wheezing[  ];  hemoptysis[  ]; shortness of breath[ y ]; paroxysmal nocturnal dyspnea[  ]; dyspnea  on exertion[  ]; or orthopnea[  ];  GI:  gallstones[  ], vomiting[  ];  dysphagia[  ]; melena[  ];  hematochezia [  ]; heartburn[  ];   Hx of  Colonoscopy[  ]; GU: kidney stones [  ]; hematuria[  ];   dysuria [  ];  nocturia[  ];  history of     obstruction [  ]; urinary frequency [  ]             Skin: rash, swelling[  ];, hair loss[  ];  peripheral edema[  ];  or itching[  ]; Musculosketetal: myalgias[  ];  joint swelling[  ];  joint erythema[  ];  joint pain[  ];  back pain[  ];  Heme/Lymph: bruising[ y ];  bleeding[  ];  anemia[  ];  Neuro: TIA[  ];  headaches[  ];  stroke[  ];  vertigo[  ];  seizures[  ];   paresthesias[  ];  difficulty walking[  ];  Psych:depression[  ]; anxiety[  ];  Endocrine: diabetes[  ];  thyroid dysfunction[  ];            t      Physical Exam: BP 108/67 (BP Location: Right Arm)    Pulse 75    Temp 97.7 F (36.5 C) (Oral)    Resp 18    Ht 6' (1.829 m)    Wt 70.8 kg    SpO2 97%    BMI 21.16 kg/m        Physical Exam  General: Thin 78 year old Caucasian male on oxygen no acute distress HEENT: Normocephalic pupils equal , dentition adequate Neck: Supple without JVD, adenopathy, or bruit Chest: Very coarse breath sounds throughout the left lung, right lung fairly clear              Cardiovascular: Regular rate and rhythm, no murmur, no gallop, peripheral pulses             palpable in all extremities Abdomen:  Soft, nontender, no palpable mass or organomegaly Extremities: Warm, well-perfused, mild  Clubbing but no evidence of cyanosis, edema or tenderness,              no venous stasis changes of the legs Rectal/GU: Deferred Neuro: Grossly non--focal and symmetrical throughout Skin: Clean and dry without rash or ulceration   Diagnostic Studies & Laboratory data:     Recent Radiology Findings:   Ct Angio Chest Pe W And/or Wo Contrast  Result Date: 06/13/2018 CLINICAL DATA:  Chest pain, rule out pulmonary embolism. History of lung cancer treated with  immune therapy EXAM: CT ANGIOGRAPHY CHEST WITH CONTRAST TECHNIQUE: Multidetector CT imaging of the chest was performed using the standard protocol during bolus administration of intravenous contrast. Multiplanar CT image reconstructions and MIPs were obtained to evaluate the vascular anatomy. CONTRAST:  20mL OMNIPAQUE IOHEXOL 350 MG/ML SOLN COMPARISON:  Chest x-ray 06/13/2018.  PET 02/15/2018 FINDINGS: Cardiovascular: Negative for pulmonary embolism. Atherosclerotic aortic arch without aneurysm. Heart size normal. No pericardial effusion Mediastinum/Nodes: Right hilar adenopathy. Multiple enlarged lymph nodes in the right hilum measuring up to 2 cm each. Anterior mediastinal mass 38 x 50 mm not present on the prior PET. This is concerning for recurrent tumor. Anterior mediastinal lymph node 10 mm. Peri carina lymph node 15 mm on the left. Subcarinal lymph node 14 mm. Lungs/Pleura: Moderately large loculated left effusion. Extensive airspace disease in the left lung could represent pneumonia however given history of carcinoma, close imaging follow-up is recommended. Severe emphysema. Right upper  lobe density is unchanged may represent scarring. No pleural effusion on the right. Upper Abdomen: Interval development of new liver lesions compatible with metastatic disease. 26 x 14 mm lesion in the posterior right lobe liver. 42 x 25 mm lesion in the medial caudal liver. Central liver lesion positive on PET is difficult to see on the current study. Calcified gallstone. Musculoskeletal: Chronic compression fracture approximately T11. No acute skeletal abnormality. Review of the MIP images confirms the above findings. IMPRESSION: 1. Negative for pulmonary embolism 2. Loculated left pleural effusion with extensive airspace disease on the left likely pneumonia. Given history of lung cancer close follow-up recommended 3. Right hilar adenopathy, showing increased uptake on PET. Peri carina lymph nodes also showing uptake on  PET. 4. Anterior mediastinal mass is new and suspicious for recurrent tumor 5. New liver lesions compatible with metastatic disease. Electronically Signed   By: Franchot Gallo M.D.   On: 06/13/2018 17:23   Dg Chest Port 1 View  Result Date: 06/13/2018 CLINICAL DATA:  Chest pain. Patient has history of stage IV lung and liver cancer. EXAM: PORTABLE CHEST 1 VIEW COMPARISON:  April 23, 2018 FINDINGS: Significant opacification of the left hemithorax is a new finding. Stable right Port-A-Cath. No change in the cardiomediastinal silhouette. Chronic emphysematous changes are again seen on the right. No suspicious nodules or masses on the right. IMPRESSION: 1. Widespread opacification in the left hemithorax is a new in the interval and nonspecific. If the patient has signs of infection, widespread pneumonia should be considered. Electronically Signed   By: Dorise Bullion III M.D   On: 06/13/2018 14:25     I have independently reviewed the above radiologic studies and discussed with the patient   Recent Lab Findings: Lab Results  Component Value Date   WBC 13.7 (H) 06/14/2018   HGB 12.3 (L) 06/14/2018   HCT 38.2 (L) 06/14/2018   PLT 492 (H) 06/14/2018   GLUCOSE 90 06/14/2018   CHOL 107 09/12/2016   TRIG 87 09/12/2016   HDL 36 (L) 09/12/2016   LDLCALC 54 09/12/2016   ALT 7 06/13/2018   AST 30 06/13/2018   NA 136 06/14/2018   K 3.5 06/14/2018   CL 105 06/14/2018   CREATININE 1.12 06/14/2018   BUN 20 06/14/2018   CO2 24 06/14/2018   TSH 1.246 06/08/2018   INR 1.16 03/17/2018   HGBA1C 5.4 09/11/2016      Assessment / Plan:      Stage IV adenocarcinoma left lung Left pneumonia with probable parapneumonic pleural effusion Severe COPD with 29% diffusion capacity    CT-guided pigtail catheter to drain left pleural effusion with testing of the fluid for cytology and culture would be best therapy for this patient.  He would not benefit or survive VATS and drainage.  I have placed order for  CT guided left chest tube placement.  Will follow. .   @ME1 @ 06/14/2018 1:38 PM

## 2018-06-14 NOTE — Progress Notes (Signed)
Patient not sure if he has a power of attorney, but he does make his own decisions. He states he has a living will and papers above his bed that says not to do anything if his heart stops. Informed the patient that the hospital would like copy of DNR and living will papers so there will be a copy in the patient's chart.

## 2018-06-14 NOTE — Progress Notes (Signed)
Pharmacy Antibiotic Note  Bradley [name] is a 78 y.o. male admitted on 06/13/2018 with pneumonia.  Pharmacy has been consulted for Vancomycin and Zosyn dosing.   The patient's SCr has bumped up slightly today requiring a Vancomycin dose adjustment. SCr 1.12, CrCl~50-60 ml/min. Since MRSA PCR is negative, can consider d/c soon.   Vancomycin 1250 mg IV Q 24 hrs. Goal AUC 400-550. Expected AUC: 487.4 SCr used: 1.12   Plan: - Adjust Vancomycin to 1250 mg IV every 24 hours - Continue Zosyn 3.375g IV every 8 hours - Azithro per MD - Will continue to follow renal function, culture results, LOT, and antibiotic de-escalation plans   Height: 6' (182.9 cm) Weight: 156 lb (70.8 kg) IBW/kg (Calculated) : 77.6  Temp (24hrs), Avg:97.8 F (36.6 C), Min:97.6 F (36.4 C), Max:97.9 F (36.6 C)  Recent Labs  Lab 06/08/18 0812 06/13/18 1402 06/13/18 1458 06/13/18 1734 06/14/18 0155  WBC 18.2* 12.0*  --   --  13.7*  CREATININE 1.01 0.98  --   --  1.12  LATICACIDVEN  --   --  0.8 1.2  --     Estimated Creatinine Clearance: 55.3 mL/min (by C-G formula based on SCr of 1.12 mg/dL).    Allergies  Allergen Reactions  . Aspirin     Regular asa     Antimicrobials this admission: 5/17 vancomycin >>  5/17 zosyn >>   Microbiology results: 5/17 BCx: ngtd 5/17 MRSA PCR: neg 5/17 covid 19 neg  Thank you for allowing pharmacy to be a part of this patient's care.  Alycia Rossetti, PharmD, BCPS Clinical Pharmacist Clinical phone for 06/14/2018: [phone] 06/14/2018 8:20 AM   **Pharmacist phone directory can now be found on [email or web address] ([email or web address]).  Listed under [place].

## 2018-06-15 ENCOUNTER — Encounter (HOSPITAL_COMMUNITY): Payer: Self-pay | Admitting: Diagnostic Radiology

## 2018-06-15 ENCOUNTER — Inpatient Hospital Stay (HOSPITAL_COMMUNITY): Payer: Medicare Other

## 2018-06-15 HISTORY — PX: IR THORACENTESIS ASP PLEURAL SPACE W/IMG GUIDE: IMG5380

## 2018-06-15 LAB — CBC
HCT: 38.2 % — ABNORMAL LOW (ref 39.0–52.0)
Hemoglobin: 12.2 g/dL — ABNORMAL LOW (ref 13.0–17.0)
MCH: 28.4 pg (ref 26.0–34.0)
MCHC: 31.9 g/dL (ref 30.0–36.0)
MCV: 88.8 fL (ref 80.0–100.0)
Platelets: 492 10*3/uL — ABNORMAL HIGH (ref 150–400)
RBC: 4.3 MIL/uL (ref 4.22–5.81)
RDW: 17 % — ABNORMAL HIGH (ref 11.5–15.5)
WBC: 13.4 10*3/uL — ABNORMAL HIGH (ref 4.0–10.5)
nRBC: 0 % (ref 0.0–0.2)

## 2018-06-15 LAB — BASIC METABOLIC PANEL
Anion gap: 7 (ref 5–15)
BUN: 14 mg/dL (ref 8–23)
CO2: 25 mmol/L (ref 22–32)
Calcium: 8.2 mg/dL — ABNORMAL LOW (ref 8.9–10.3)
Chloride: 104 mmol/L (ref 98–111)
Creatinine, Ser: 1.16 mg/dL (ref 0.61–1.24)
GFR calc Af Amer: >60 mL/min (ref >60)
GFR calc non Af Amer: >60 mL/min (ref >60)
Glucose, Bld: 82 mg/dL (ref 70–99)
Potassium: 3.7 mmol/L (ref 3.5–5.1)
Sodium: 136 mmol/L (ref 135–145)

## 2018-06-15 LAB — GRAM STAIN

## 2018-06-15 LAB — PROTIME-INR
INR: 1.2 (ref 0.8–1.2)
Prothrombin Time: 14.6 seconds (ref 11.4–15.2)

## 2018-06-15 MED ORDER — FENTANYL CITRATE (PF) 100 MCG/2ML IJ SOLN
INTRAMUSCULAR | Status: AC
  Filled 2018-06-15: qty 2

## 2018-06-15 MED ORDER — MIDAZOLAM HCL 2 MG/2ML IJ SOLN
INTRAMUSCULAR | Status: AC
  Filled 2018-06-15: qty 2

## 2018-06-15 MED ORDER — LIDOCAINE HCL 1 % IJ SOLN
INTRAMUSCULAR | Status: AC | PRN
  Administered 2018-06-15: 10 mL

## 2018-06-15 MED ORDER — LIDOCAINE HCL 1 % IJ SOLN
INTRAMUSCULAR | Status: AC
  Filled 2018-06-15: qty 20

## 2018-06-15 MED ORDER — FENTANYL CITRATE (PF) 100 MCG/2ML IJ SOLN
INTRAMUSCULAR | Status: AC | PRN
  Administered 2018-06-15: 25 ug via INTRAVENOUS

## 2018-06-15 NOTE — Progress Notes (Signed)
PROGRESS NOTE    Bradley [name]  BUL:[id number] DOB: 1940-04-30 DOA: 06/13/2018 PCP: Jani Gravel, MD   Brief Narrative:  Per HPI: Bradley [name] a 78 y.o.malewith medical history significant forCOPDon 3L O2, hypertension, coronary artery disease,stage 4 Non-small cell lung cancer currently on chemotherapy who presented to the ED with complaints of left-sided chest pain, increasing difficulty breathing and cough productive of yellowish phlegm.Difficultybreathing and cough are chronic. Chest pain is left-sided, worse with deep breathing and coughing, progressively worsening over the past week. He denies associated fever or chills, no leg swelling redness or pain.No sick contacts. No sore throat, no myalgias.  Patient was transferred from [facility] to [facility] with pneumonia and associated loculated pleural effusion.  He has undergone ultrasound-guided thoracentesis on 5/19 with 1450 mL of fluid removed from the left chest.  Follow-up chest x-ray with no findings of pneumothorax noted.  He continues to remain on IV Zosyn and azithromycin and continues to complain of some minimal left-sided chest pain, but this is improved after the thoracentesis.   Assessment & Plan:   Active Problems:   Pneumonia   Pneumonia with loculated pleural effusion- dyspnea,productive cough,leftchestpain. Rules out for sepsis. WBC- 12. Lactic acid 0.8. O2 sats >90% on home 3 L O2. SARs COVIDtest negative.Pattern of pneumonia not consistent with COVID, no viral prodrome,no sick contacts. CTA chest negative for PE, shows extensive airspace disease on the left with loculated pleural effusion. -Blood Cx with no growth thus far, will monitor -Monitor fluid culture results from thoracentesis -MRSA negative; vancomycin DC 5/18.  Continue Zosyn and azithromycin for now and follow culture results.  Stage IV non-small cell lung cancer-adenocarcinoma, stage IV.Diagnosed 12/2017. CT suggests new as  suspicious for recurrent tumor,and new liver lesions compatible with metastatic disease.. Follows with Dr. Delton Coombes. Currently on chemotherapy.  COPD-stable. -Continue home bronchodilators  HTN-stable. -Continue home Norvasc and metoprolol - Hold Lisinopril for contrast exposure  Coronary artery disease- Stable. EKG unchanged. Troponinx1 negative. - Aspirin restarted 5/18  PreadmissionScreeningSARS-CoV-2 test negative   DVT prophylaxis:SCDs Code Status: DNR Family Communication:None at bedside  Disposition Plan:  Thoracentesis performed 5/19.  Continue current antibiotics and await further fluid culture results.  Anticipate DC in next 24 hours pending results.  Could likely discharge to home on oral Augmentin.   Consultants:   CVTS  IR  Procedures:   US guided thoracentesis to L chest 5/19  Antimicrobials:   Vancomycin 5/17-5/18  Zosyn 5/17->  Azithromycin 5/17->  Subjective: Patient seen and evaluated today with no new acute complaints or concerns. No acute concerns or events noted overnight.  He continues to have some left-sided chest wall pain that is now improved after thoracentesis.  Objective: Vitals:   06/14/18 2136 06/15/18 0610 06/15/18 1001 06/15/18 1053  BP: (!) 107/57 136/79 (!) 144/75 116/71  Pulse: 72 77 77 75  Resp:  16 (!) 25 18  Temp: 97.9 F (36.6 C) 97.6 F (36.4 C)  97.7 F (36.5 C)  TempSrc: Oral Oral  Oral  SpO2: 92% 92% 98% 99%  Weight:      Height:        Intake/Output Summary (Last 24 hours) at 06/15/2018 1139 Last data filed at 06/15/2018 1025 Gross per 24 hour  Intake 361.89 ml  Output 2600 ml  Net -2238.11 ml   Filed Weights   06/13/18 1326 06/13/18 2112  Weight: 71 kg 70.8 kg    Examination:  General exam: Appears calm and comfortable  Respiratory system: Clear to auscultation. Respiratory effort normal.  Currently on 2 L nasal cannula oxygen. Cardiovascular system: S1 & S2 heard, RRR. No JVD,  murmurs, rubs, gallops or clicks. No pedal edema. Gastrointestinal system: Abdomen is nondistended, soft and nontender. No organomegaly or masses felt. Normal bowel sounds heard. Central nervous system: Alert and oriented. No focal neurological deficits. Extremities: Symmetric 5 x 5 power. Skin: No rashes, lesions or ulcers Psychiatry: Judgement and insight appear normal. Mood & affect appropriate.     Data Reviewed: I have personally reviewed following labs and imaging studies  CBC: Recent Labs  Lab 06/13/18 1402 06/14/18 0155 06/15/18 0305  WBC 12.0* 13.7* 13.4*  NEUTROABS 6.8  --   --   HGB 13.4 12.3* 12.2*  HCT 42.9 38.2* 38.2*  MCV 91.3 90.5 88.8  PLT 537* 492* 127*   Basic Metabolic Panel: Recent Labs  Lab 06/13/18 1402 06/14/18 0155 06/15/18 0305  NA 136 136 136  K 4.6 3.5 3.7  CL 105 105 104  CO2 21* 24 25  GLUCOSE 109* 90 82  BUN 26* 20 14  CREATININE 0.98 1.12 1.16  CALCIUM 8.3* 8.1* 8.2*   GFR: Estimated Creatinine Clearance: 53.4 mL/min (by C-G formula based on SCr of 1.16 mg/dL). Liver Function Tests: Recent Labs  Lab 06/13/18 1402  AST 30  ALT 7  ALKPHOS 47  BILITOT 1.2  PROT 8.0  ALBUMIN 2.5*   No results for input(s): LIPASE, AMYLASE in the last 168 hours. No results for input(s): AMMONIA in the last 168 hours. Coagulation Profile: Recent Labs  Lab 06/15/18 0305  INR 1.2   Cardiac Enzymes: Recent Labs  Lab 06/13/18 1402  TROPONINI <0.03   BNP (last 3 results) No results for input(s): PROBNP in the last 8760 hours. HbA1C: No results for input(s): HGBA1C in the last 72 hours. CBG: No results for input(s): GLUCAP in the last 168 hours. Lipid Profile: No results for input(s): CHOL, HDL, LDLCALC, TRIG, CHOLHDL, LDLDIRECT in the last 72 hours. Thyroid Function Tests: No results for input(s): TSH, T4TOTAL, FREET4, T3FREE, THYROIDAB in the last 72 hours. Anemia Panel: No results for input(s): VITAMINB12, FOLATE, FERRITIN, TIBC,  IRON, RETICCTPCT in the last 72 hours. Sepsis Labs: Recent Labs  Lab 06/13/18 1458 06/13/18 1734  LATICACIDVEN 0.8 1.2    Recent Results (from the past 240 hour(s))  Blood Culture (routine x 2)     Status: None (Preliminary result)   Collection Time: 06/13/18  2:59 PM  Result Value Ref Range Status   Specimen Description BLOOD RIGHT HAND  Final   Special Requests   Final    BOTTLES DRAWN AEROBIC AND ANAEROBIC Blood Culture adequate volume   Culture   Final    NO GROWTH 2 DAYS Performed at [facility], [address], [place], [place] [address]    Report Status PENDING  Incomplete  SARS Coronavirus 2 (CEPHEID- Performed in [place] hospital lab), Hosp Order     Status: None   Collection Time: 06/13/18  3:00 PM  Result Value Ref Range Status   SARS Coronavirus 2 NEGATIVE NEGATIVE Final    Comment: (NOTE) If result is NEGATIVE SARS-CoV-2 target nucleic acids are NOT DETECTED. The SARS-CoV-2 RNA is generally detectable in upper and lower  respiratory specimens during the acute phase of infection. The lowest  concentration of SARS-CoV-2 viral copies this assay can detect is 250  copies / mL. A negative result does not preclude SARS-CoV-2 infection  and should not be used as the sole basis for treatment or other  patient management decisions.  A negative result may occur with  improper specimen collection / handling, submission of specimen other  than nasopharyngeal swab, presence of viral mutation(s) within the  areas targeted by this assay, and inadequate number of viral copies  (<250 copies / mL). A negative result must be combined with clinical  observations, patient history, and epidemiological information. If result is POSITIVE SARS-CoV-2 target nucleic acids are DETECTED. The SARS-CoV-2 RNA is generally detectable in upper and lower  respiratory specimens dur ing the acute phase of infection.  Positive  results are indicative of active infection with SARS-CoV-2.   Clinical  correlation with patient history and other diagnostic information is  necessary to determine patient infection status.  Positive results do  not rule out bacterial infection or co-infection with other viruses. If result is PRESUMPTIVE POSTIVE SARS-CoV-2 nucleic acids MAY BE PRESENT.   A presumptive positive result was obtained on the submitted specimen  and confirmed on repeat testing.  While 2019 novel coronavirus  (SARS-CoV-2) nucleic acids may be present in the submitted sample  additional confirmatory testing may be necessary for epidemiological  and / or clinical management purposes  to differentiate between  SARS-CoV-2 and other Sarbecovirus currently known to infect humans.  If clinically indicated additional testing with an alternate test  methodology [phone]) is advised. The SARS-CoV-2 RNA is generally  detectable in upper and lower respiratory sp ecimens during the acute  phase of infection. The expected result is Negative. Fact Sheet for Patients:  StrictlyIdeas.no Fact Sheet for Healthcare Providers: BankingDealers.co.za This test is not yet approved or cleared by the [place] FDA and has been authorized for detection and/or diagnosis of SARS-CoV-2 by FDA under an Emergency Use Authorization (EUA).  This EUA will remain in effect (meaning this test can be used) for the duration of the COVID-19 declaration under Section 564(b)(1) of the Act, 21 U.S.C. section 360bbb-3(b)(1), unless the authorization is terminated or revoked sooner. Performed at [facility], [address], [place], [place] [address]   Blood Culture (routine x 2)     Status: None   Collection Time: 06/13/18  3:00 PM  Result Value Ref Range Status   Specimen Description BLOOD LEFT HAND  Final   Special Requests   Final    Blood Culture adequate volume BOTTLES DRAWN AEROBIC AND ANAEROBIC   Culture  Setup Time   Final    GRAM POSITIVE RODS  AEROBIC BOTTLE ONLY Gram Stain Report Called to,Read Back By and Verified With: MURRELL,K AT 0715 BU HUFFINES,S ON 06/15/18.    Culture   Final    NO GROWTH 2 DAYS Performed at [facility], [address], [place], [place] [address]    Report Status 06/15/2018 FINAL  Final  MRSA PCR Screening     Status: None   Collection Time: 06/13/18 11:15 PM  Result Value Ref Range Status   MRSA by PCR NEGATIVE NEGATIVE Final    Comment:        The GeneXpert MRSA Assay (FDA approved for NASAL specimens only), is one component of a comprehensive MRSA colonization surveillance program. It is not intended to diagnose MRSA infection nor to guide or monitor treatment for MRSA infections. Performed at [facility], [place] [address], [place], [place] [address]          Radiology Studies: Dg Chest 1 View  Result Date: 06/15/2018 CLINICAL DATA:  Status post thoracentesis EXAM: CHEST  1 VIEW COMPARISON:  06/13/2018 FINDINGS: Interval thoracentesis has  been performed on the left. No pneumothorax is seen. Persistent infiltrates are noted particularly in the mid and upper left lung. Emphysematous changes are noted. Right chest wall port is seen. IMPRESSION: No evidence of pneumothorax following thoracentesis. Electronically Signed   By: Inez Catalina M.D.   On: 06/15/2018 11:04   Ct Angio Chest Pe W And/or Wo Contrast  Result Date: 06/13/2018 CLINICAL DATA:  Chest pain, rule out pulmonary embolism. History of lung cancer treated with immune therapy EXAM: CT ANGIOGRAPHY CHEST WITH CONTRAST TECHNIQUE: Multidetector CT imaging of the chest was performed using the standard protocol during bolus administration of intravenous contrast. Multiplanar CT image reconstructions and MIPs were obtained to evaluate the vascular anatomy. CONTRAST:  58mL OMNIPAQUE IOHEXOL 350 MG/ML SOLN COMPARISON:  Chest x-ray 06/13/2018.  PET 02/15/2018 FINDINGS: Cardiovascular: Negative for pulmonary embolism. Atherosclerotic aortic  arch without aneurysm. Heart size normal. No pericardial effusion Mediastinum/Nodes: Right hilar adenopathy. Multiple enlarged lymph nodes in the right hilum measuring up to 2 cm each. Anterior mediastinal mass 38 x 50 mm not present on the prior PET. This is concerning for recurrent tumor. Anterior mediastinal lymph node 10 mm. Peri carina lymph node 15 mm on the left. Subcarinal lymph node 14 mm. Lungs/Pleura: Moderately large loculated left effusion. Extensive airspace disease in the left lung could represent pneumonia however given history of carcinoma, close imaging follow-up is recommended. Severe emphysema. Right upper lobe density is unchanged may represent scarring. No pleural effusion on the right. Upper Abdomen: Interval development of new liver lesions compatible with metastatic disease. 26 x 14 mm lesion in the posterior right lobe liver. 42 x 25 mm lesion in the medial caudal liver. Central liver lesion positive on PET is difficult to see on the current study. Calcified gallstone. Musculoskeletal: Chronic compression fracture approximately T11. No acute skeletal abnormality. Review of the MIP images confirms the above findings. IMPRESSION: 1. Negative for pulmonary embolism 2. Loculated left pleural effusion with extensive airspace disease on the left likely pneumonia. Given history of lung cancer close follow-up recommended 3. Right hilar adenopathy, showing increased uptake on PET. Peri carina lymph nodes also showing uptake on PET. 4. Anterior mediastinal mass is new and suspicious for recurrent tumor 5. New liver lesions compatible with metastatic disease. Electronically Signed   By: Franchot Gallo M.D.   On: 06/13/2018 17:23   Dg Chest Port 1 View  Result Date: 06/13/2018 CLINICAL DATA:  Chest pain. Patient has history of stage IV lung and liver cancer. EXAM: PORTABLE CHEST 1 VIEW COMPARISON:  April 23, 2018 FINDINGS: Significant opacification of the left hemithorax is a new finding. Stable  right Port-A-Cath. No change in the cardiomediastinal silhouette. Chronic emphysematous changes are again seen on the right. No suspicious nodules or masses on the right. IMPRESSION: 1. Widespread opacification in the left hemithorax is a new in the interval and nonspecific. If the patient has signs of infection, widespread pneumonia should be considered. Electronically Signed   By: Dorise Bullion III M.D   On: 06/13/2018 14:25   Ir Thoracentesis Asp Pleural Space W/img Guide  Result Date: 06/15/2018 INDICATION: 78 year old with lung cancer. Left chest wall pain, shortness of breath and left pleural effusion. Request for chest tube placement. EXAM: ULTRASOUND GUIDED LEFT THORACENTESIS MEDICATIONS: None. COMPLICATIONS: None immediate. PROCEDURE: Left chest was evaluated with ultrasound. Simple left pleural fluid was identified. I felt the patient would benefit from thoracentesis rather than chest tube placement at this time. Discussed with Dr. Prescott Gum and we decided  to proceed with only ultrasound-guided thoracentesis. An ultrasound guided thoracentesis was thoroughly discussed with the patient and questions answered. The benefits, risks, alternatives and complications were also discussed. The patient understands and wishes to proceed with the procedure. Written consent was obtained. Ultrasound was performed to localize and mark an adequate pocket of fluid in the left chest. The area was then prepped and draped in the normal sterile fashion. 1% Lidocaine was used for local anesthesia. Under ultrasound guidance a 6 Fr Safe-T-Centesis catheter was introduced. Thoracentesis was performed. The catheter was removed and a dressing applied. FINDINGS: A total of approximately 1.45 L of dark yellow fluid was removed. Samples were sent to the laboratory as requested by the clinical team. IMPRESSION: Successful ultrasound guided left thoracentesis yielding 1.45 L of pleural fluid. Electronically Signed   By: Markus Daft  M.D.   On: 06/15/2018 11:29        Scheduled Meds:  amLODipine  5 mg Oral Daily   aspirin EC  81 mg Oral Daily   buPROPion  150 mg Oral BID   celecoxib  200 mg Oral Daily   cyclobenzaprine  5 mg Oral BID   [START ON 06/16/2018] enoxaparin (LOVENOX) injection  40 mg Subcutaneous Q24H   ferrous sulfate  325 mg Oral Daily   fluticasone  2 spray Each Nare Daily   gabapentin  300 mg Oral TID   lidocaine       metoprolol succinate  25 mg Oral Daily   mirabegron ER  25 mg Oral QHS   montelukast  10 mg Oral QHS   pantoprazole  40 mg Oral Daily   simvastatin  10 mg Oral QHS   Continuous Infusions:  azithromycin 500 mg (06/14/18 2108)   piperacillin-tazobactam (ZOSYN)  IV 3.375 g (06/15/18 0535)     LOS: 2 days    Time spent: 30 minutes    Karyme Mcconathy Darleen Crocker, DO [facility] Pager [phone]  If 7PM-7AM, please contact night-coverage [email or web address] Password [facility] 06/15/2018, 11:39 AM

## 2018-06-15 NOTE — Consult Note (Signed)
Chief Complaint: Patient was seen in consultation today for left thoracentesis vs left chest tube drain placement Chief Complaint  Patient presents with   Chest Pain   at the request of Dr Catalina Lunger  Referring Physician(s): Dr Einar Grad  Supervising Physician: Markus Daft  Patient Status: [facility] - In-pt  History of Present Illness: Bradley [name] is a 78 y.o. male   COPD; HTN; CAD; Lung Ca Left chest pain and increased cough Covid neg 06/13/18 Not VATS candidate per Dr Darcey Nora-- extensive cancer  CT 5/17:  IMPRESSION: 1. Negative for pulmonary embolism 2. Loculated left pleural effusion with extensive airspace disease on the left likely pneumonia. Given history of lung cancer close follow-up recommended 3. Right hilar adenopathy, showing increased uptake on PET. Peri carina lymph nodes also showing uptake on PET. 4. Anterior mediastinal mass is new and suspicious for recurrent tumor 5. New liver lesions compatible with metastatic disease.  Scheduled for thoracentesis vs left chest tube drain placement  Past Medical History:  Diagnosis Date   Cancer ([place])    lung   Chronic lower back pain    Chronic pain    pain management   Contact with stonefish as cause of accidental injury 1954   "stung across my left hand in [place]; LUE weaker since"   COPD (chronic obstructive pulmonary disease) ([place])    Coronary artery disease    Dyspnea    with exertion   High cholesterol    History of blood transfusion 1943   History of kidney stones    passed   Hypertension    Mass of left lung    Migraine    "none since the 1990s" (09/11/2016)   Myocardial infarction ([place]) 01/20/1993; 02/28/1993   Neuromuscular disorder ([place])    neuropathy left   Pneumonia    "several times" (09/11/2016)   Tobacco abuse     Past Surgical History:  Procedure Laterality Date   APPENDECTOMY     CARDIAC CATHETERIZATION     CATARACT EXTRACTION, BILATERAL Bilateral     COLONOSCOPY     HIP ARTHROPLASTY Right 12/20/2016   Procedure: ARTHROPLASTY BIPOLAR HIP (HEMIARTHROPLASTY);  Surgeon: [name], MD;  Location: [place];  Service: Orthopedics;  Laterality: Right;   JOINT REPLACEMENT     LEFT HEART CATH AND CORONARY ANGIOGRAPHY N/A 09/12/2016   Procedure: LEFT HEART CATH AND CORONARY ANGIOGRAPHY;  Surgeon: Burnell Blanks, MD;  Location: [place] [facility];  Service: Cardiovascular;  Laterality: N/A;   NASAL ENDOSCOPY WITH EPISTAXIS CONTROL Right 03/15/2016   Procedure: NASAL ENDOSCOPY WITH EPISTAXIS CONTROL;  Surgeon: Jodi Marble, MD;  Location: [place];  Service: ENT;  Laterality: Right;   PORTACATH PLACEMENT Right 04/23/2018   Procedure: INSERTION PORT-A-CATH;  Surgeon: Aviva Signs, MD;  Location: [facility];  Service: General;  Laterality: Right;   SEPTOPLASTY Right 03/15/2016   Procedure: SEPTOPLASTY;  Surgeon: Jodi Marble, MD;  Location: [facility];  Service: ENT;  Laterality: Right;   SINUS EXPLORATION Right 03/15/2016   Procedure: SINUS EXPLORATION;  Surgeon: Jodi Marble, MD;  Location: [place];  Service: ENT;  Laterality: Right;   TONSILLECTOMY     TOOTH EXTRACTION     TOTAL KNEE ARTHROPLASTY Left    VIDEO BRONCHOSCOPY WITH ENDOBRONCHIAL ULTRASOUND N/A 12/15/2017   Procedure: VIDEO BRONCHOSCOPY WITH ENDOBRONCHIAL ULTRASOUND;  Surgeon: Rigoberto Noel, MD;  Location: [facility];  Service: Thoracic;  Laterality: N/A;    Allergies: Aspirin  Medications: Prior to Admission medications   Medication  Sig Start Date End Date Taking? Authorizing Provider  ADVAIR HFA 115-21 MCG/ACT inhaler Inhale 2 puffs into the lungs every 12 (twelve) hours. 01/29/16  Yes [provider]  amLODipine (NORVASC) 5 MG tablet TAKE 1 TABLET BY MOUTH EVERY DAY Patient taking differently: Take 5 mg by mouth daily.  08/24/17  Yes Lendon Colonel, NP  Ascorbic Acid (VITAMIN C) 500 MG CAPS Take 500 mg by mouth daily.   Yes [provider]  aspirin EC 81 MG  tablet Take 81 mg by mouth daily.    Yes [provider]  buPROPion (WELLBUTRIN SR) 150 MG 12 hr tablet Take 1 tablet (150 mg total) by mouth 2 (two) times daily. Take 1 tablet daily for 1 week, stop smoking, then take twice daily after first week. 12/09/17 06/13/18 Yes Fenton Foy, NP  celecoxib (CELEBREX) 200 MG capsule Take 1 capsule (200 mg total) by mouth daily. 12/22/16  Yes [name], MD  cyclobenzaprine (FLEXERIL) 5 MG tablet Take 5 mg by mouth 2 (two) times daily.    Yes [provider]  Ferrous Sulfate (IRON) 325 (65 Fe) MG TABS Take 1 tablet by mouth daily. 12/10/15  Yes [provider]  fluticasone (FLONASE) 50 MCG/ACT nasal spray Place 2 sprays into both nostrils daily. 12/11/17  Yes [provider]  gabapentin (NEURONTIN) 300 MG capsule Take 1 capsule by mouth 3 (three) times daily. 08/15/16  Yes [provider]  Ipilimumab (YERVOY IV) Inject into the vein every 6 (six) weeks.   Yes [provider]  ipratropium-albuterol (DUONEB) 0.5-2.5 (3) MG/3ML SOLN Take 3 mLs by nebulization 3 (three) times daily. 01/01/18  Yes Johnson, Clanford L, MD  lidocaine-prilocaine (EMLA) cream Apply 1 application topically as needed. Apply a small amount to port site and cover with plastic wrap 1 hour prior to infusion appointments 04/23/18  Yes Derek Jack, MD  lisinopril (PRINIVIL,ZESTRIL) 20 MG tablet Take 0.5 tablets (10 mg total) by mouth daily. Patient taking differently: Take 20 mg by mouth daily.  01/01/18  Yes Johnson, Clanford L, MD  metoCLOPramide (REGLAN) 5 MG tablet Take 1 tablet by mouth 4 (four) times daily -  before meals and at bedtime. 01/25/16  Yes [provider]  metoprolol succinate (TOPROL-XL) 25 MG 24 hr tablet Take 25 mg by mouth daily.   Yes [provider]  mirabegron ER (MYRBETRIQ) 25 MG TB24 tablet Take 25 mg by mouth at bedtime.    Yes [provider]  montelukast (SINGULAIR) 10 MG  tablet Take 1 tablet by mouth daily. 06/02/18  Yes [provider]  Multiple Vitamins-Minerals (MULTIVITAMIN WITH MINERALS) tablet Take 1 tablet by mouth daily.   Yes [provider]  nitroGLYCERIN (NITROSTAT) 0.4 MG SL tablet Place 0.4 mg under the tongue every 5 (five) minutes as needed for chest pain.   Yes [provider]  Nivolumab (OPDIVO IV) Inject into the vein every 14 (fourteen) days.   Yes [provider]  omeprazole (PRILOSEC) 40 MG capsule Take 40 mg by mouth daily.    Yes [provider]  oxyCODONE (OXY IR/ROXICODONE) 5 MG immediate release tablet Take 1 tablet (5 mg total) by mouth every 4 (four) hours as needed for severe pain. 04/23/18  Yes Aviva Signs, MD  potassium gluconate (HM POTASSIUM) 595 (99 K) MG TABS tablet Take 595 mg by mouth daily.   Yes [provider]  simvastatin (ZOCOR) 10 MG tablet Take 10 mg by mouth at bedtime.  12/23/15  Yes [provider]  VENTOLIN HFA 108 (90 Base) MCG/ACT inhaler Inhale 2 puffs into the lungs every 4 (four) hours as needed for wheezing or shortness of breath.  12/11/15  Yes [provider]     Family History  Adopted: Yes  Problem Relation Age of Onset   Cancer Mother    Obesity Father     Social History   Socioeconomic History   Marital status: Widowed    Spouse name: Not on file   Number of children: Not on file   Years of education: Not on file   Highest education level: Not on file  Occupational History   Not on file  Social Needs   Financial resource strain: Not on file   Food insecurity:    Worry: Not on file    Inability: Not on file   Transportation needs:    Medical: Not on file    Non-medical: Not on file  Tobacco Use   Smoking status: Current Every Day Smoker    Packs/day: 1.00    Years: 70.00    Pack years: 70.00    Types: Cigarettes   Smokeless tobacco: Never Used   Tobacco comment: smokes 8 a day  Substance and Sexual  Activity   Alcohol use: No    Comment: 11/26/2017  "drank from the age of 61 til 03/05/1993"   Drug use: Not Currently    Comment: 11/26/2017 "used some drugs when I was young"   Sexual activity: Not Currently  Lifestyle   Physical activity:    Days per week: Not on file    Minutes per session: Not on file   Stress: Not on file  Relationships   Social connections:    Talks on phone: Not on file    Gets together: Not on file    Attends religious service: Not on file    Active member of club or organization: Not on file    Attends meetings of clubs or organizations: Not on file    Relationship status: Not on file  Other Topics Concern   Not on file  Social History Narrative   Not on file    Review of Systems: A 12 point ROS discussed and pertinent positives are indicated in the HPI above.  All other systems are negative.  Review of Systems  Constitutional: Positive for activity change, appetite change, fatigue and fever.  Respiratory: Positive for cough, shortness of breath and wheezing.   Cardiovascular: Positive for chest pain.  Musculoskeletal: Positive for back pain.  Neurological: Positive for weakness.  Psychiatric/Behavioral: Negative for behavioral problems and confusion.    Vital Signs: BP 136/79 (BP Location: Left Arm)    Pulse 77    Temp 97.6 F (36.4 C) (Oral)    Resp 16    Ht 6' (1.829 m)    Wt 156 lb (70.8 kg)    SpO2 92%    BMI 21.16 kg/m   Physical Exam Vitals signs reviewed.  Cardiovascular:     Rate and Rhythm: Normal rate and regular rhythm.     Heart sounds: Normal heart sounds.  Pulmonary:     Breath sounds: Examination of the left-upper field reveals decreased breath sounds and rhonchi. Examination of the left-middle field reveals decreased breath sounds and rhonchi. Examination of the left-lower field reveals decreased breath sounds and rhonchi. Decreased breath sounds and rhonchi present.  Abdominal:     General: Bowel sounds are normal.    Musculoskeletal: Normal range of  motion.  Skin:    General: Skin is warm and dry.  Neurological:     Mental Status: He is alert and oriented to person, place, and time.  Psychiatric:        Behavior: Behavior normal.     Imaging: Ct Angio Chest Pe W And/or Wo Contrast  Result Date: 06/13/2018 CLINICAL DATA:  Chest pain, rule out pulmonary embolism. History of lung cancer treated with immune therapy EXAM: CT ANGIOGRAPHY CHEST WITH CONTRAST TECHNIQUE: Multidetector CT imaging of the chest was performed using the standard protocol during bolus administration of intravenous contrast. Multiplanar CT image reconstructions and MIPs were obtained to evaluate the vascular anatomy. CONTRAST:  53mL OMNIPAQUE IOHEXOL 350 MG/ML SOLN COMPARISON:  Chest x-ray 06/13/2018.  PET 02/15/2018 FINDINGS: Cardiovascular: Negative for pulmonary embolism. Atherosclerotic aortic arch without aneurysm. Heart size normal. No pericardial effusion Mediastinum/Nodes: Right hilar adenopathy. Multiple enlarged lymph nodes in the right hilum measuring up to 2 cm each. Anterior mediastinal mass 38 x 50 mm not present on the prior PET. This is concerning for recurrent tumor. Anterior mediastinal lymph node 10 mm. Peri carina lymph node 15 mm on the left. Subcarinal lymph node 14 mm. Lungs/Pleura: Moderately large loculated left effusion. Extensive airspace disease in the left lung could represent pneumonia however given history of carcinoma, close imaging follow-up is recommended. Severe emphysema. Right upper lobe density is unchanged may represent scarring. No pleural effusion on the right. Upper Abdomen: Interval development of new liver lesions compatible with metastatic disease. 26 x 14 mm lesion in the posterior right lobe liver. 42 x 25 mm lesion in the medial caudal liver. Central liver lesion positive on PET is difficult to see on the current study. Calcified gallstone. Musculoskeletal: Chronic compression fracture approximately  T11. No acute skeletal abnormality. Review of the MIP images confirms the above findings. IMPRESSION: 1. Negative for pulmonary embolism 2. Loculated left pleural effusion with extensive airspace disease on the left likely pneumonia. Given history of lung cancer close follow-up recommended 3. Right hilar adenopathy, showing increased uptake on PET. Peri carina lymph nodes also showing uptake on PET. 4. Anterior mediastinal mass is new and suspicious for recurrent tumor 5. New liver lesions compatible with metastatic disease. Electronically Signed   By: Franchot Gallo M.D.   On: 06/13/2018 17:23   Dg Chest Port 1 View  Result Date: 06/13/2018 CLINICAL DATA:  Chest pain. Patient has history of stage IV lung and liver cancer. EXAM: PORTABLE CHEST 1 VIEW COMPARISON:  April 23, 2018 FINDINGS: Significant opacification of the left hemithorax is a new finding. Stable right Port-A-Cath. No change in the cardiomediastinal silhouette. Chronic emphysematous changes are again seen on the right. No suspicious nodules or masses on the right. IMPRESSION: 1. Widespread opacification in the left hemithorax is a new in the interval and nonspecific. If the patient has signs of infection, widespread pneumonia should be considered. Electronically Signed   By: Dorise Bullion III M.D   On: 06/13/2018 14:25    Labs:  CBC: Recent Labs    06/08/18 0812 06/13/18 1402 06/14/18 0155 06/15/18 0305  WBC 18.2* 12.0* 13.7* 13.4*  HGB 14.4 13.4 12.3* 12.2*  HCT 45.5 42.9 38.2* 38.2*  PLT 527* 537* 492* 492*    COAGS: Recent Labs    12/29/17 0904 03/17/18 1125 06/15/18 0305  INR 1.07 1.16 1.2    BMP: Recent Labs    06/08/18 0812 06/13/18 1402 06/14/18 0155 06/15/18 0305  NA 138 136 136 136  K 4.1  4.6 3.5 3.7  CL 105 105 105 104  CO2 24 21* 24 25  GLUCOSE 103* 109* 90 82  BUN 20 26* 20 14  CALCIUM 8.6* 8.3* 8.1* 8.2*  CREATININE 1.01 0.98 1.12 1.16  GFRNONAA >60 >60 >60 >60  GFRAA >60 >60 >60 >60     LIVER FUNCTION TESTS: Recent Labs    05/10/18 1008 05/24/18 0921 06/08/18 0812 06/13/18 1402  BILITOT 1.0 0.5 0.6 1.2  AST 19 13* 13* 30  ALT 14 7 12 7   ALKPHOS 45 40 49 47  PROT 7.7 8.0 8.4* 8.0  ALBUMIN 2.6* 2.7* 2.8* 2.5*    TUMOR MARKERS: No results for input(s): AFPTM, CEA, CA199, CHROMGRNA in the last 8760 hours.  Assessment and Plan:  Left loculated pleural effusion Leukocytosis Scheduled for thoracentesis vs chest tube placement Risks and benefits discussed with the patient including bleeding, infection, damage to adjacent structures, pneumothorax, and sepsis.  All of the patient's questions were answered, patient is agreeable to proceed. Consent signed and in chart.   Thank you for this interesting consult.  I greatly enjoyed meeting Bradley [name] and look forward to participating in their care.  A copy of this report was sent to the requesting provider on this date.  Electronically Signed: Lavonia Drafts, PA-C 06/15/2018, 9:12 AM   I spent a total of 40 Minutes    in face to face in clinical consultation, greater than 50% of which was counseling/coordinating care for left thoracentesis vs chest tube placement

## 2018-06-15 NOTE — Procedures (Signed)
Interventional Radiology Procedure:   Indications: Left pleural effusion  Procedure: US guided thoracentesis  Findings: Removed 1450 ml from left chest.  Complications: None     EBL: Less than 10 ml  Plan: Follow up CXR   Zianne Schubring R. Anselm Pancoast, MD  Pager: [phone]

## 2018-06-15 NOTE — Progress Notes (Signed)
  Subjective: L U/S guided thoracentesis drained 1.5 L pleural effusion CXR better with stable parenchymal disease Will check CXR tomorrow Fluid sent for cultures Objective: Vital signs in last 24 hours: Temp:  [97.6 F (36.4 C)-97.9 F (36.6 C)] 97.8 F (36.6 C) (05/19 1417) Pulse Rate:  [72-81] 81 (05/19 1417) Cardiac Rhythm: Normal sinus rhythm (05/19 1000) Resp:  [16-25] 18 (05/19 1053) BP: (107-149)/(57-88) 149/88 (05/19 1417) SpO2:  [90 %-99 %] 90 % (05/19 1417)  Hemodynamic parameters for last 24 hours:  afebrile  Intake/Output from previous day: 05/18 0701 - 05/19 0700 In: 361.9 [IV Piggyback:361.9] Out: 1150 [Urine:1150] Intake/Output this shift: Total I/O In: -  Out: 1725 [Urine:275; Other:1450]    Lab Results: Recent Labs    06/14/18 0155 06/15/18 0305  WBC 13.7* 13.4*  HGB 12.3* 12.2*  HCT 38.2* 38.2*  PLT 492* 492*   BMET:  Recent Labs    06/14/18 0155 06/15/18 0305  NA 136 136  K 3.5 3.7  CL 105 104  CO2 24 25  GLUCOSE 90 82  BUN 20 14  CREATININE 1.12 1.16  CALCIUM 8.1* 8.2*    PT/INR:  Recent Labs    06/15/18 0305  LABPROT 14.6  INR 1.2   ABG    Component Value Date/Time   TCO2 25 03/15/2016 0747   CBG (last 3)  No results for input(s): GLUCAP in the last 72 hours.  Assessment/Plan: S/P  Will follow results of pleural fluid cultures and CXR Hopefully the effusion is sterile and nothing more will be needed    LOS: 2 days    Tharon Aquas Trigt III 06/15/2018

## 2018-06-16 ENCOUNTER — Inpatient Hospital Stay (HOSPITAL_COMMUNITY): Payer: Medicare Other

## 2018-06-16 DIAGNOSIS — J90 Pleural effusion, not elsewhere classified: Secondary | ICD-10-CM

## 2018-06-16 DIAGNOSIS — Z7189 Other specified counseling: Secondary | ICD-10-CM

## 2018-06-16 DIAGNOSIS — D72829 Elevated white blood cell count, unspecified: Secondary | ICD-10-CM

## 2018-06-16 DIAGNOSIS — Z515 Encounter for palliative care: Secondary | ICD-10-CM

## 2018-06-16 DIAGNOSIS — C799 Secondary malignant neoplasm of unspecified site: Secondary | ICD-10-CM

## 2018-06-16 DIAGNOSIS — C3490 Malignant neoplasm of unspecified part of unspecified bronchus or lung: Secondary | ICD-10-CM

## 2018-06-16 LAB — CBC
HCT: 40 % (ref 39.0–52.0)
Hemoglobin: 13 g/dL (ref 13.0–17.0)
MCH: 28.6 pg (ref 26.0–34.0)
MCHC: 32.5 g/dL (ref 30.0–36.0)
MCV: 88.1 fL (ref 80.0–100.0)
Platelets: 517 10*3/uL — ABNORMAL HIGH (ref 150–400)
RBC: 4.54 MIL/uL (ref 4.22–5.81)
RDW: 16.6 % — ABNORMAL HIGH (ref 11.5–15.5)
WBC: 14.1 10*3/uL — ABNORMAL HIGH (ref 4.0–10.5)
nRBC: 0 % (ref 0.0–0.2)

## 2018-06-16 LAB — CULTURE, BLOOD (ROUTINE X 2): Special Requests: ADEQUATE

## 2018-06-16 LAB — BASIC METABOLIC PANEL
Anion gap: 9 (ref 5–15)
BUN: 14 mg/dL (ref 8–23)
CO2: 27 mmol/L (ref 22–32)
Calcium: 8.8 mg/dL — ABNORMAL LOW (ref 8.9–10.3)
Chloride: 101 mmol/L (ref 98–111)
Creatinine, Ser: 1.15 mg/dL (ref 0.61–1.24)
GFR calc Af Amer: >60 mL/min (ref >60)
GFR calc non Af Amer: >60 mL/min (ref >60)
Glucose, Bld: 84 mg/dL (ref 70–99)
Potassium: 3.9 mmol/L (ref 3.5–5.1)
Sodium: 137 mmol/L (ref 135–145)

## 2018-06-16 NOTE — Care Management Important Message (Signed)
Important Message  Patient Details  Name: Bradley [name] MRN: [id number] Date of Birth: 01-14-1941   Medicare Important Message Given:  Yes    Sharin Mons, RN 06/16/2018, 9:50 AM

## 2018-06-16 NOTE — Progress Notes (Signed)
Patient ID: Bradley [name], male   DOB: [birth year], 78 y.o.   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  Patient was transferred from [facility] to [facility] for pneumonia and associated loculated pleural effusion.  He underwent ultrasound-guided thoracentesis on 06/15/2018 with 1450 mL of fluid removed from the left chest.  He has been on intravenous antibiotics.  Assessment & Plan:   Active Problems:   Pneumonia  Community-acquired pneumonia with loculated pleural effusion -CTA chest negative for PE, showed extensive airspace disease on the left with loculated pleural effusion -Currently on Zosyn and azithromycin -Cultures negative so far -Status post ultrasound-guided thoracentesis on 06/15/2018 with removal of 1450 mL from left chest.  Follow cultures.  Cardiothoracic surgery following -DC azithromycin. -Preadmission SARS-CoV-2 test negative  Leukocytosis -From above.  Monitor  Stage IV non-small cell lung cancer COPD with chronic hypoxic respiratory failure Tobacco abuse -CT chest was suspicious for recurrent lung tumor along with new liver lesions compatible with metastatic disease.  Follows up with Dr. Katragadda/oncology.  Currently on chemotherapy as an outpatient -Still smoking and has no plans to quit. -Continue oxygen supplementation. -We will get palliative care evaluation for goals of care discussion -Continue bronchodilators  Hypertension -Stable.  Continue Norvasc and metoprolol.   DVT prophylaxis: SCDs Code Status: DNR Family Communication: None at bedside Disposition Plan: Home in 1 to 2 days once  cleared by CVTS  Consultants: CVTS/IR  Procedures: Ultrasound-guided thoracentesis on 06/15/2018  Antimicrobials:  Anti-infectives (From admission, onward)   Start     Dose/Rate Route Frequency Ordered Stop   06/14/18 2200  vancomycin (VANCOCIN) 1,250 mg in sodium chloride 0.9 % 250 mL IVPB  Status:  Discontinued     1,250 mg 166.7 mL/hr over 90 Minutes Intravenous Every 24 hours 06/14/18 0821 06/14/18 1414   06/14/18 0000  vancomycin (VANCOCIN) IVPB 1000 mg/200 mL premix  Status:  Discontinued     1,000 mg 200 mL/hr over 60 Minutes Intravenous Every 12 hours 06/13/18 1907 06/14/18 0821   06/13/18 2300  piperacillin-tazobactam (ZOSYN) IVPB 3.375 g     3.375 g 12.5 mL/hr over 240 Minutes Intravenous Every 8 hours 06/13/18 1903     06/13/18 2015  azithromycin (ZITHROMAX) 500 mg in sodium chloride 0.9 % 250 mL IVPB     500 mg 250 mL/hr over 60 Minutes Intravenous Every 24 hours 06/13/18 2007     06/13/18 1530  piperacillin-tazobactam (ZOSYN) IVPB 3.375 g     3.375 g 100 mL/hr over 30 Minutes Intravenous  Once 06/13/18 1516 06/13/18 1605   06/13/18 1530  vancomycin (VANCOCIN) IVPB 1000 mg/200 mL premix     1,000 mg 200 mL/hr over 60 Minutes Intravenous  Once 06/13/18 1516 06/13/18 1631       Subjective: Patient seen and examined at bedside.  Complains of left-sided chest pain.  Denies any overnight fever or vomiting.  No worsening shortness of breath.  Objective: Vitals:   06/15/18 1417 06/15/18 2122 06/16/18 0526 06/16/18 0834  BP: (!) 149/88 126/63 128/73 134/72  Pulse: 81 65 73 75  Resp:  16 16   Temp: 97.8 F (36.6 C) 97.9 F (36.6 C) 97.8 F (  36.6 C) 97.8 F (36.6 C)  TempSrc: Oral Oral    SpO2: 90% 97% 93% 96%  Weight:   69 kg   Height:        Intake/Output Summary (Last 24 hours) at 06/16/2018 1200 Last data filed at 06/16/2018 0844 Gross per 24 hour  Intake 661.26 ml  Output 1145 ml  Net -483.74 ml   Filed Weights   06/13/18 1326 06/13/18 2112 06/16/18 0526   Weight: 71 kg 70.8 kg 69 kg    Examination:  General exam: Appears calm and comfortable.  Looks chronically ill. Respiratory system: Bilateral decreased breath sounds at bases with some scattered crackles more on the left base Cardiovascular system: S1 & S2 heard, Rate controlled Gastrointestinal system: Abdomen is nondistended, soft and nontender. Normal bowel sounds heard. Extremities: No cyanosis, clubbing, edema    Data Reviewed: I have personally reviewed following labs and imaging studies  CBC: Recent Labs  Lab 06/13/18 1402 06/14/18 0155 06/15/18 0305 06/16/18 0313  WBC 12.0* 13.7* 13.4* 14.1*  NEUTROABS 6.8  --   --   --   HGB 13.4 12.3* 12.2* 13.0  HCT 42.9 38.2* 38.2* 40.0  MCV 91.3 90.5 88.8 88.1  PLT 537* 492* 492* 993*   Basic Metabolic Panel: Recent Labs  Lab 06/13/18 1402 06/14/18 0155 06/15/18 0305 06/16/18 0313  NA 136 136 136 137  K 4.6 3.5 3.7 3.9  CL 105 105 104 101  CO2 21* 24 25 27   GLUCOSE 109* 90 82 84  BUN 26* 20 14 14   CREATININE 0.98 1.12 1.16 1.15  CALCIUM 8.3* 8.1* 8.2* 8.8*   GFR: Estimated Creatinine Clearance: 52.5 mL/min (by C-G formula based on SCr of 1.15 mg/dL). Liver Function Tests: Recent Labs  Lab 06/13/18 1402  AST 30  ALT 7  ALKPHOS 47  BILITOT 1.2  PROT 8.0  ALBUMIN 2.5*   No results for input(s): LIPASE, AMYLASE in the last 168 hours. No results for input(s): AMMONIA in the last 168 hours. Coagulation Profile: Recent Labs  Lab 06/15/18 0305  INR 1.2   Cardiac Enzymes: Recent Labs  Lab 06/13/18 1402  TROPONINI <0.03   BNP (last 3 results) No results for input(s): PROBNP in the last 8760 hours. HbA1C: No results for input(s): HGBA1C in the last 72 hours. CBG: No results for input(s): GLUCAP in the last 168 hours. Lipid Profile: No results for input(s): CHOL, HDL, LDLCALC, TRIG, CHOLHDL, LDLDIRECT in the last 72 hours. Thyroid Function Tests: No results for input(s): TSH, T4TOTAL, FREET4, T3FREE,  THYROIDAB in the last 72 hours. Anemia Panel: No results for input(s): VITAMINB12, FOLATE, FERRITIN, TIBC, IRON, RETICCTPCT in the last 72 hours. Sepsis Labs: Recent Labs  Lab 06/13/18 1458 06/13/18 1734  LATICACIDVEN 0.8 1.2    Recent Results (from the past 240 hour(s))  Blood Culture (routine x 2)     Status: None (Preliminary result)   Collection Time: 06/13/18  2:59 PM  Result Value Ref Range Status   Specimen Description BLOOD RIGHT HAND  Final   Special Requests   Final    BOTTLES DRAWN AEROBIC AND ANAEROBIC Blood Culture adequate volume   Culture   Final    NO GROWTH 3 DAYS Performed at [facility], [address] [place], [place] [address]    Report Status PENDING  Incomplete  SARS Coronavirus 2 (CEPHEID- Performed in [place] hospital lab), Hosp Order     Status: None   Collection Time: 06/13/18  3:00 PM  Result Value  Ref Range Status   SARS Coronavirus 2 NEGATIVE NEGATIVE Final    Comment: (NOTE) If result is NEGATIVE SARS-CoV-2 target nucleic acids are NOT DETECTED. The SARS-CoV-2 RNA is generally detectable in upper and lower  respiratory specimens during the acute phase of infection. The lowest  concentration of SARS-CoV-2 viral copies this assay can detect is 250  copies / mL. A negative result does not preclude SARS-CoV-2 infection  and should not be used as the sole basis for treatment or other  patient management decisions.  A negative result may occur with  improper specimen collection / handling, submission of specimen other  than nasopharyngeal swab, presence of viral mutation(s) within the  areas targeted by this assay, and inadequate number of viral copies  (<250 copies / mL). A negative result must be combined with clinical  observations, patient history, and epidemiological information. If result is POSITIVE SARS-CoV-2 target nucleic acids are DETECTED. The SARS-CoV-2 RNA is generally detectable in upper and lower  respiratory specimens dur ing  the acute phase of infection.  Positive  results are indicative of active infection with SARS-CoV-2.  Clinical  correlation with patient history and other diagnostic information is  necessary to determine patient infection status.  Positive results do  not rule out bacterial infection or co-infection with other viruses. If result is PRESUMPTIVE POSTIVE SARS-CoV-2 nucleic acids MAY BE PRESENT.   A presumptive positive result was obtained on the submitted specimen  and confirmed on repeat testing.  While 2019 novel coronavirus  (SARS-CoV-2) nucleic acids may be present in the submitted sample  additional confirmatory testing may be necessary for epidemiological  and / or clinical management purposes  to differentiate between  SARS-CoV-2 and other Sarbecovirus currently known to infect humans.  If clinically indicated additional testing with an alternate test  methodology [phone]) is advised. The SARS-CoV-2 RNA is generally  detectable in upper and lower respiratory sp ecimens during the acute  phase of infection. The expected result is Negative. Fact Sheet for Patients:  StrictlyIdeas.no Fact Sheet for Healthcare Providers: BankingDealers.co.za This test is not yet approved or cleared by the [place] FDA and has been authorized for detection and/or diagnosis of SARS-CoV-2 by FDA under an Emergency Use Authorization (EUA).  This EUA will remain in effect (meaning this test can be used) for the duration of the COVID-19 declaration under Section 564(b)(1) of the Act, 21 U.S.C. section 360bbb-3(b)(1), unless the authorization is terminated or revoked sooner. Performed at [facility], [address] [place], [place] [address]   Blood Culture (routine x 2)     Status: Abnormal   Collection Time: 06/13/18  3:00 PM  Result Value Ref Range Status   Specimen Description   Final    BLOOD LEFT HAND Performed at [facility], [address], [place], [place] [address]    Special Requests   Final    Blood Culture adequate volume BOTTLES DRAWN AEROBIC AND ANAEROBIC Performed at [place] [place], [place] [address]    Culture  Setup Time   Final    GRAM POSITIVE RODS AEROBIC BOTTLE ONLY Gram Stain Report Called to,Read Back By and Verified With: MURRELL,K AT 0715 BU HUFFINES,S ON 06/15/18. Performed at [facility], [address] [place], [place] [address]    Culture (A)  Final    DIPHTHEROIDS(CORYNEBACTERIUM SPECIES) Standardized susceptibility testing for this organism is not available. Performed at [facility], [place] [address], [place], [place] [address]  Report Status 06/16/2018 FINAL  Final  MRSA PCR Screening     Status: None   Collection Time: 06/13/18 11:15 PM  Result Value Ref Range Status   MRSA by PCR NEGATIVE NEGATIVE Final    Comment:        The GeneXpert MRSA Assay (FDA approved for NASAL specimens only), is one component of a comprehensive MRSA colonization surveillance program. It is not intended to diagnose MRSA infection nor to guide or monitor treatment for MRSA infections. Performed at [facility], [place] [address] [place], [place] [address]   Gram stain     Status: None   Collection Time: 06/15/18 10:34 AM  Result Value Ref Range Status   Specimen Description FLUID PLEURAL LEFT  Final   Special Requests NONE  Final   Gram Stain   Final    WBC PRESENT, PREDOMINANTLY MONONUCLEAR NO ORGANISMS SEEN CYTOSPIN SMEAR Performed at [facility], [place] [address] [place], [place] [address]    Report Status 06/15/2018 FINAL  Final         Radiology Studies: Dg Chest 1 View  Result Date: 06/15/2018 CLINICAL DATA:  Status post thoracentesis EXAM: CHEST  1 VIEW COMPARISON:  06/13/2018 FINDINGS: Interval thoracentesis has been performed on the left. No pneumothorax is seen. Persistent infiltrates are noted particularly in the mid and upper left lung.  Emphysematous changes are noted. Right chest wall port is seen. IMPRESSION: No evidence of pneumothorax following thoracentesis. Electronically Signed   By: Inez Catalina M.D.   On: 06/15/2018 11:04   Dg Chest 2 View  Result Date: 06/16/2018 CLINICAL DATA:  Pleural effusion EXAM: CHEST - 2 VIEW COMPARISON:  Yesterday FINDINGS: Airspace disease with volume loss on the left. There is left pleural fluid by CT without evidence of increase. Normal heart size. Porta catheter in stable position IMPRESSION: 1. Stable extensive airspace disease on the left. No evidence of increasing pleural fluid. 2. COPD. Electronically Signed   By: Monte Fantasia M.D.   On: 06/16/2018 10:06   Ir Thoracentesis Asp Pleural Space W/img Guide  Result Date: 06/15/2018 INDICATION: 79 year old with lung cancer. Left chest wall pain, shortness of breath and left pleural effusion. Request for chest tube placement. EXAM: ULTRASOUND GUIDED LEFT THORACENTESIS MEDICATIONS: None. COMPLICATIONS: None immediate. PROCEDURE: Left chest was evaluated with ultrasound. Simple left pleural fluid was identified. I felt the patient would benefit from thoracentesis rather than chest tube placement at this time. Discussed with Dr. Prescott Gum and we decided to proceed with only ultrasound-guided thoracentesis. An ultrasound guided thoracentesis was thoroughly discussed with the patient and questions answered. The benefits, risks, alternatives and complications were also discussed. The patient understands and wishes to proceed with the procedure. Written consent was obtained. Ultrasound was performed to localize and mark an adequate pocket of fluid in the left chest. The area was then prepped and draped in the normal sterile fashion. 1% Lidocaine was used for local anesthesia. Under ultrasound guidance a 6 Fr Safe-T-Centesis catheter was introduced. Thoracentesis was performed. The catheter was removed and a dressing applied. FINDINGS: A total of approximately  1.45 L of dark yellow fluid was removed. Samples were sent to the laboratory as requested by the clinical team. IMPRESSION: Successful ultrasound guided left thoracentesis yielding 1.45 L of pleural fluid. Electronically Signed   By: Markus Daft M.D.   On: 06/15/2018 11:29        Scheduled Meds:  amLODipine  5 mg Oral Daily   aspirin EC  81 mg Oral Daily   buPROPion  150 mg Oral BID   celecoxib  200 mg Oral Daily   cyclobenzaprine  5 mg Oral BID   enoxaparin (LOVENOX) injection  40 mg Subcutaneous Q24H   ferrous sulfate  325 mg Oral Daily   fluticasone  2 spray Each Nare Daily   gabapentin  300 mg Oral TID   metoprolol succinate  25 mg Oral Daily   mirabegron ER  25 mg Oral QHS   montelukast  10 mg Oral QHS   pantoprazole  40 mg Oral Daily   simvastatin  10 mg Oral QHS   Continuous Infusions:  azithromycin Stopped (06/15/18 2232)   piperacillin-tazobactam (ZOSYN)  IV 3.375 g (06/16/18 0521)     LOS: 3 days        Aline August, MD [facility] 06/16/2018, 12:00 PM

## 2018-06-16 NOTE — Evaluation (Signed)
Physical Therapy Evaluation Patient Details Name: Bradley [name] MRN: [id number] DOB: 1940-12-09 Today's Date: 06/16/2018   History of Present Illness  Patient is a 78 y/o male presenting with chestpain and cough. Past medical history significant for COPD on 3L O2, hypertension, coronary artery disease, stage 4 Non-small cell lung cancer currently on chemotherapy. Admitted for Pneumonia with loculated pleural effusion.    Clinical Impression  Patient admitted with the above listed diagnosis. Patient reports Mod I with mobility prior to admission. Patient today requiring use of SPC and 3L O2 with min guard for all mobility. Desat to 85% on 3L via [place] with cueing for breathing patterns able to improve to >90% with seated rest break. Will currently recommend HHPT at discharge. PT to continue to follow.     Follow Up Recommendations Home health PT;Supervision - Intermittent    Equipment Recommendations  None recommended by PT    Recommendations for Other Services       Precautions / Restrictions Precautions Precautions: Fall Restrictions Weight Bearing Restrictions: No      Mobility  Bed Mobility Overal bed mobility: Needs Assistance Bed Mobility: Supine to Sit;Sit to Supine     Supine to sit: Supervision Sit to supine: Supervision   General bed mobility comments: increased time  Transfers Overall transfer level: Needs assistance Equipment used: Straight cane Transfers: Sit to/from Stand Sit to Stand: Min guard         General transfer comment: for safety  Ambulation/Gait Ambulation/Gait assistance: Min guard Gait Distance (Feet): 40 Feet(x2) Assistive device: Straight cane Gait Pattern/deviations: Step-through pattern;Decreased stride length;Trunk flexed Gait velocity: decreased   General Gait Details: slow psce of gait; on 3L O2 via [place] with SOB - desat to 85%  Stairs            Wheelchair Mobility    Modified Rankin (Stroke Patients Only)        Balance Overall balance assessment: Mild deficits observed, not formally tested                                           Pertinent Vitals/Pain Pain Assessment: Faces Faces Pain Scale: Hurts little more Pain Location: L ribs/back with coughing, deep breathing Pain Descriptors / Indicators: Aching;Discomfort;Guarding;Grimacing Pain Intervention(s): Limited activity within patient's tolerance;Monitored during session;Repositioned    Home Living Family/patient expects to be discharged to:: Private residence Living Arrangements: Children Available Help at Discharge: Family;Available 24 hours/day;Available PRN/intermittently Type of Home: House Home Access: Stairs to enter Entrance Stairs-Rails: None Entrance Stairs-Number of Steps: 2 Home Layout: One level Home Equipment: Cane - single point;Walker - 2 wheels;Bedside commode      Prior Function Level of Independence: Independent with assistive device(s)         Comments: household and short distanced community ambulator with [facility]     Hand Dominance        Extremity/Trunk Assessment   Upper Extremity Assessment Upper Extremity Assessment: Overall WFL for tasks assessed    Lower Extremity Assessment Lower Extremity Assessment: Generalized weakness    Cervical / Trunk Assessment Cervical / Trunk Assessment: Normal  Communication   Communication: No difficulties  Cognition Arousal/Alertness: Awake/alert Behavior During Therapy: WFL for tasks assessed/performed Overall Cognitive Status: Within Functional Limits for tasks assessed  General Comments      Exercises     Assessment/Plan    PT Assessment Patient needs continued PT services  PT Problem List Decreased strength;Decreased activity tolerance;Decreased balance;Decreased mobility;Decreased safety awareness       PT Treatment Interventions DME instruction;Gait training;Stair  training;Functional mobility training;Therapeutic activities;Therapeutic exercise;Balance training;Patient/family education    PT Goals (Current goals can be found in the Care Plan section)  Acute Rehab PT Goals Patient Stated Goal: reduce pain PT Goal Formulation: With patient Time For Goal Achievement: 06/30/18 Potential to Achieve Goals: Good    Frequency Min 3X/week   Barriers to discharge        Co-evaluation               AM-PAC PT "6 Clicks" Mobility  Outcome Measure Help needed turning from your back to your side while in a flat bed without using bedrails?: None Help needed moving from lying on your back to sitting on the side of a flat bed without using bedrails?: A Little Help needed moving to and from a bed to a chair (including a wheelchair)?: A Little Help needed standing up from a chair using your arms (e.g., wheelchair or bedside chair)?: A Little Help needed to walk in hospital room?: A Little Help needed climbing 3-5 steps with a railing? : A Lot 6 Click Score: 18    End of Session Equipment Utilized During Treatment: Gait belt;Oxygen Activity Tolerance: Patient tolerated treatment well Patient left: in bed;with call bell/phone within reach Nurse Communication: Mobility status PT Visit Diagnosis: Unsteadiness on feet (R26.81);Other abnormalities of gait and mobility (R26.89);Muscle weakness (generalized) (M62.81)    Time: 1335-1350 PT Time Calculation (min) (ACUTE ONLY): 15 min   Charges:   PT Evaluation $PT Eval Moderate Complexity: 1 Mod           Lanney Gins, PT, DPT Supplemental Physical Therapist 06/16/18 1:56 PM Pager: [phone] Office: [phone]

## 2018-06-16 NOTE — Progress Notes (Addendum)
      [address]       [place] [address]             [phone]           Subjective: Patient states his breathing is better today than yesterday.  Objective: Vital signs in last 24 hours: Temp:  [97.7 F (36.5 C)-97.9 F (36.6 C)] 97.8 F (36.6 C) (05/20 0526) Pulse Rate:  [65-81] 75 (05/20 0834) Cardiac Rhythm: Normal sinus rhythm (05/19 1930) Resp:  [16-25] 16 (05/20 0526) BP: (116-149)/(63-88) 134/72 (05/20 0834) SpO2:  [90 %-99 %] 96 % (05/20 0834) Weight:  [69 kg] 69 kg (05/20 0526)     Intake/Output from previous day: 05/19 0701 - 05/20 0700 In: -  Out: 2345 [Urine:895]   Physical Exam:  Cardiovascular: RRR Pulmonary: Diminished bilaterally Extremities: No lower extremity edema.    Lab Results: CBC: Recent Labs    06/15/18 0305 06/16/18 0313  WBC 13.4* 14.1*  HGB 12.2* 13.0  HCT 38.2* 40.0  PLT 492* 517*   BMET:  Recent Labs    06/15/18 0305 06/16/18 0313  NA 136 137  K 3.7 3.9  CL 104 101  CO2 25 27  GLUCOSE 82 84  BUN 14 14  CREATININE 1.16 1.15  CALCIUM 8.2* 8.8*    PT/INR:  Recent Labs    06/15/18 0305  LABPROT 14.6  INR 1.2   ABG:  INR: Will add last result for INR, ABG once components are confirmed Will add last 4 CBG results once components are confirmed  Assessment/Plan:  1. CV - SR. On Amlodipine 5 mg daily and Toprol XL 25 mg daily 2.  Pulmonary - History of COPD and stage IV left lung adenocarcinoma. S/p left thoracentesis with 1450 ml reoved yesterday. CXR this am appears stable (emphysematous changes and persistent infiltrates). Continue Singulair 3. ID- On Zosyn and Azithromycin for PNA. Left pleural fluid gram stain shows no organisms. Await cytology result. Blood  Culture showed gram positive rods 4. Management per primary  Donielle M ZimmermanPA-C 06/16/2018,9:08 AM [phone] Patient needs follow-up chest x-ray next 2 days to make sure the left pleural effusion does not recur.  I  am concerned he may have a malignant effusion. patient examined and medical record reviewed,agree with above note. Tharon Aquas Trigt III 06/16/2018

## 2018-06-16 NOTE — Consult Note (Signed)
Consultation Note Date: 06/16/2018   Patient Name: Bradley [name]  DOB: 03/23/1940  MRN: [id number]  Age / Sex: 78 y.o., male  PCP: Jani Gravel, MD Referring Physician: Aline August, MD  Reason for Consultation: Establishing goals of care and Psychosocial/spiritual support  HPI/Patient Profile: 78 y.o. male  with past medical history of COPD, lung cancer stage 4 (followed by Dr. Raliegh Ip at [facility]) who was admitted on 06/13/2018 with SOB and chest pain.  He was found to have extensive infiltrate and loculated pleural effusion.  1.5 liters of pleural fluid was drained and gram positive rods were found in the fluid.   Clinical Assessment and Goals of Care:  I have reviewed medical records including EPIC notes, labs and imaging, received report from the care team, assessed the patient and then talked with his daughter Bradley [name] to discuss diagnosis prognosis, [place], EOL wishes, disposition and options.  The PMT met with Bradley [name] and his daughter back in December 2019.  At that time he chose DNR and did not want to pursue aggressive treatment for his cancer.     He tells me he lives in a house with 6 women and is well cared for by his daughter Bradley [name].  He talks about his cooking hobby and growing his own spices.  He was quite the chef.  He also tells me about living in [place] for many years.  He left when Bradley [name] was little and just moved back 4 years ago.  Bradley [name] is very thankful to have her father back in her life.  She is his medical surrogate Bradley [name].  Bradley [name] is very hard of hearing and requests that Bradley [name] be updated on all medical issues as he misses much of what the doctors tell him.  As far as functional and nutritional status he is still walking, but is losing significant amounts of weight despite being on a "see-food" diet.  He eats everything in sight.  I discussed further treatment vs Hospice  services with both the patient and his daughter.  Bradley [name] still has quality of life and will continue to pursue therapy if it is offered - however, he is concerned that the therapy may not be helping him as the cancer continues to spread and he is becoming weaker.  Bradley [name] asked me for a prognosis on her father.  After explaining that it is very difficult to know for sure I told her that if all therapy is stopped he likely has less than 6 months and therefore is eligible for hospice.  I also explained that if the fluid accumulates again quickly that is a bad sign and he may have significantly less than 6 months.  Hospice and Palliative Care services outpatient were explained and offered.  Bradley [name] was very much in favor of starting Hospice services.  She reports they were very helpful with her mother.  Additionally Bradley [name] expresses that she is grieving the loss of her son (murdered) and her mother.  She needs support to help with her father.  Hospice grief counseling would benefit Bradley [name] now.  Questions and concerns were addressed.  The family was encouraged to call with questions or concerns.   I spoke with Dr. Raliegh Ip of [facility].  We discussed [name]'s case.  Dr. Raliegh Ip. Would like to see [name] in hospital follow up ASAP to discuss the results of the updated CT scan and whether or not to move forward with immunotherapy.    Primary Decision Maker:  PATIENT and his daughter Bradley [name].    SUMMARY OF RECOMMENDATIONS    1.  Please call Bradley [name] [phone] with all medical updates.  She is concerned that her father is hard of hearing and does not understand 75% of what the doctors are saying to him.  2.  Please ensure a quick follow up appointment is scheduled with Dr. Delton Coombes, Oncology, [facility].  3.  Palliative Care to follow outpatient on discharge.  [facility] has grief counseling services that would benefit Bradley [name] immediately.  Further this will put Mr.  Leinberger in the system to receive Hospice services as soon as he is eligible.  Code Status/Advance Care Planning:  DNR.   Symptom Management:   May consider Pleurx Cath later if pleural effusion is recurrent and he elects no further immunotherapy.   Prognosis: Months if he elects no further immunotherapy.  He is at high risk for an acute event that could end his life suddenly.   Discharge Planning: Home with Palliative Services      Primary Diagnoses: Present on Admission: . Pneumonia   I have reviewed the medical record, interviewed the patient and family, and examined the patient. The following aspects are pertinent.  Past Medical History:  Diagnosis Date  . Cancer ([place])    lung  . Chronic lower back pain   . Chronic pain    pain management  . Contact with stonefish as cause of accidental injury 1954   "stung across my left hand in [place]; LUE weaker since"  . COPD (chronic obstructive pulmonary disease) ([place])   . Coronary artery disease   . Dyspnea    with exertion  . High cholesterol   . History of blood transfusion 1943  . History of kidney stones    passed  . Hypertension   . Mass of left lung   . Migraine    "none since the 1990s" (09/11/2016)  . Myocardial infarction ([place]) 01/20/1993; 02/28/1993  . Neuromuscular disorder ([facility])    neuropathy left  . Pneumonia    "several times" (09/11/2016)  . Tobacco abuse    Social History   Socioeconomic History  . Marital status: Widowed    Spouse name: Not on file  . Number of children: Not on file  . Years of education: Not on file  . Highest education level: Not on file  Occupational History  . Not on file  Social Needs  . Financial resource strain: Not on file  . Food insecurity:    Worry: Not on file    Inability: Not on file  . Transportation needs:    Medical: Not on file    Non-medical: Not on file  Tobacco Use  . Smoking status: Current Every Day Smoker    Packs/day: 1.00    Years: 70.00     Pack years: 70.00    Types: Cigarettes  . Smokeless tobacco: Never Used  . Tobacco comment: smokes 8 a day  Substance and Sexual Activity  . Alcohol use: No  Comment: 11/26/2017  "drank from the age of 8 til 03/05/1993"  . Drug use: Not Currently    Comment: 11/26/2017 "used some drugs when I was young"  . Sexual activity: Not Currently  Lifestyle  . Physical activity:    Days per week: Not on file    Minutes per session: Not on file  . Stress: Not on file  Relationships  . Social connections:    Talks on phone: Not on file    Gets together: Not on file    Attends religious service: Not on file    Active member of club or organization: Not on file    Attends meetings of clubs or organizations: Not on file    Relationship status: Not on file  Other Topics Concern  . Not on file  Social History Narrative  . Not on file   Family History  Adopted: Yes  Problem Relation Age of Onset  . Cancer Mother   . Obesity Father    Scheduled Meds: . amLODipine  5 mg Oral Daily  . aspirin EC  81 mg Oral Daily  . buPROPion  150 mg Oral BID  . celecoxib  200 mg Oral Daily  . cyclobenzaprine  5 mg Oral BID  . enoxaparin (LOVENOX) injection  40 mg Subcutaneous Q24H  . ferrous sulfate  325 mg Oral Daily  . fluticasone  2 spray Each Nare Daily  . gabapentin  300 mg Oral TID  . metoprolol succinate  25 mg Oral Daily  . mirabegron ER  25 mg Oral QHS  . montelukast  10 mg Oral QHS  . pantoprazole  40 mg Oral Daily  . simvastatin  10 mg Oral QHS   Continuous Infusions: . piperacillin-tazobactam (ZOSYN)  IV 3.375 g (06/16/18 1355)   PRN Meds:.acetaminophen **OR** acetaminophen, albuterol, morphine injection, ondansetron **OR** ondansetron (ZOFRAN) IV, polyethylene glycol Allergies  Allergen Reactions  . Aspirin     Regular asa    Review of Systems complains of chest and back pain.  The back pain is chronic. Physical Exam Thin, awake, alert, coherent, very hard of hearing.  Chatty.   Vital Signs: BP 108/61 (BP Location: Left Arm)   Pulse 69   Temp 97.8 F (36.6 C)   Resp 16   Ht 6' (1.829 m)   Wt 69 kg   SpO2 95%   BMI 20.63 kg/m  Pain Scale: 0-10   Pain Score: Asleep   SpO2: SpO2: 95 % O2 Device:SpO2: 95 % O2 Flow Rate: .O2 Flow Rate (L/min): 3 L/min  IO: Intake/output summary:   Intake/Output Summary (Last 24 hours) at 06/16/2018 1542 Last data filed at 06/16/2018 1358 Gross per 24 hour  Intake 883.26 ml  Output 870 ml  Net 13.26 ml    LBM: Last BM Date: 06/13/18 Baseline Weight: Weight: 71 kg Most recent weight: Weight: 69 kg     Palliative Assessment/Data: 50%     Time In: 3:00 Time Out: 4:30 Time Total: 90 min. Greater than 50%  of this time was spent counseling and coordinating care related to the above assessment and plan.  Signed by: Florentina Jenny, PA-C Palliative Medicine Pager: [phone]  Please contact Palliative Medicine Team phone at [phone] for questions and concerns.  For individual provider: See Shea Evans

## 2018-06-17 ENCOUNTER — Inpatient Hospital Stay (HOSPITAL_COMMUNITY): Payer: Medicare Other

## 2018-06-17 DIAGNOSIS — C799 Secondary malignant neoplasm of unspecified site: Secondary | ICD-10-CM

## 2018-06-17 LAB — BASIC METABOLIC PANEL
Anion gap: 8 (ref 5–15)
BUN: 14 mg/dL (ref 8–23)
CO2: 27 mmol/L (ref 22–32)
Calcium: 8.8 mg/dL — ABNORMAL LOW (ref 8.9–10.3)
Chloride: 102 mmol/L (ref 98–111)
Creatinine, Ser: 0.97 mg/dL (ref 0.61–1.24)
GFR calc Af Amer: >60 mL/min (ref >60)
GFR calc non Af Amer: >60 mL/min (ref >60)
Glucose, Bld: 131 mg/dL — ABNORMAL HIGH (ref 70–99)
Potassium: 3.7 mmol/L (ref 3.5–5.1)
Sodium: 137 mmol/L (ref 135–145)

## 2018-06-17 LAB — CBC WITH DIFFERENTIAL/PLATELET
Abs Immature Granulocytes: 0.07 10*3/uL (ref 0.00–0.07)
Basophils Absolute: 0.1 10*3/uL (ref 0.0–0.1)
Basophils Relative: 1 %
Eosinophils Absolute: 1.9 10*3/uL — ABNORMAL HIGH (ref 0.0–0.5)
Eosinophils Relative: 14 %
HCT: 38.8 % — ABNORMAL LOW (ref 39.0–52.0)
Hemoglobin: 12.7 g/dL — ABNORMAL LOW (ref 13.0–17.0)
Immature Granulocytes: 1 %
Lymphocytes Relative: 23 %
Lymphs Abs: 3.2 10*3/uL (ref 0.7–4.0)
MCH: 28.9 pg (ref 26.0–34.0)
MCHC: 32.7 g/dL (ref 30.0–36.0)
MCV: 88.2 fL (ref 80.0–100.0)
Monocytes Absolute: 1 10*3/uL (ref 0.1–1.0)
Monocytes Relative: 7 %
Neutro Abs: 7.5 10*3/uL (ref 1.7–7.7)
Neutrophils Relative %: 54 %
Platelets: 513 10*3/uL — ABNORMAL HIGH (ref 150–400)
RBC: 4.4 MIL/uL (ref 4.22–5.81)
RDW: 16.4 % — ABNORMAL HIGH (ref 11.5–15.5)
WBC: 13.7 10*3/uL — ABNORMAL HIGH (ref 4.0–10.5)
nRBC: 0 % (ref 0.0–0.2)

## 2018-06-17 LAB — MAGNESIUM: Magnesium: 1.7 mg/dL (ref 1.7–2.4)

## 2018-06-17 NOTE — Progress Notes (Addendum)
Patient ID: Bradley [name], male   DOB: 1940-04-27, 78 y.o.   MRN: [id number]  PROGRESS NOTE    Bradley [name]  RSW:[id number] DOB: 09/24/1940 DOA: 06/13/2018 PCP: Bradley Gravel, MD   Brief Narrative:  78 year old male with history of COPD on 3 L oxygen via nasal cannula at home, hypertension, coronary artery disease, stage IV non-small cell lung cancer currently on chemotherapy presented with left-sided chest pain along with difficulty breathing and yellow sputum.  Patient was [facility] from [facility] to [facility] for pneumonia and associated loculated pleural effusion.  He underwent ultrasound-guided thoracentesis on 06/15/2018 with 1450 mL of fluid removed from the left chest.  He has been on intravenous antibiotics.  Assessment & Plan:   Active Problems:   Pneumonia   Metastatic cancer [facility])   Palliative care encounter   Encounter for hospice care discussion  Community-acquired pneumonia with loculated pleural effusion -CTA chest negative for PE, showed extensive airspace disease on the left with loculated pleural effusion -Currently on Zosyn and azithromycin -Cultures negative so far -Status post ultrasound-guided thoracentesis on 06/15/2018 with removal of 1450 mL from left chest.  Follow cultures.  Cardiothoracic surgery following, follow further recommendations. -Off azithromycin. -Preadmission SARS-CoV-2 test negative  Leukocytosis -From above.  Improving.  Diphtheroids bacteremia -Most likely contaminant.  No further work-up needed.  Stage IV non-small cell lung cancer COPD with chronic hypoxic respiratory failure Tobacco abuse -CT chest was suspicious for recurrent lung tumor along with new liver lesions compatible with metastatic disease.  Follows up with Bradley [name]/oncology.  Currently on chemotherapy as an outpatient -Still smoking and has no plans to quit. -Continue oxygen supplementation. -Palliative care evaluation appreciated.  Patient will follow-up with his  regular oncologist and discuss further options including hospice. -Continue bronchodilators  Hypertension -Stable.  Continue Norvasc and metoprolol.   DVT prophylaxis: Lovenox Code Status: DNR Family Communication: Spoke to daughter Bradley [name] on phone Disposition Plan: Home in 1 to 2 days once cleared by CVTS  Consultants: CVTS/IR  Procedures: Ultrasound-guided thoracentesis on 06/15/2018  Antimicrobials:  Anti-infectives (From admission, onward)   Start     Dose/Rate Route Frequency Ordered Stop   06/14/18 2200  vancomycin (VANCOCIN) 1,250 mg in sodium chloride 0.9 % 250 mL IVPB  Status:  Discontinued     1,250 mg 166.7 mL/hr over 90 Minutes Intravenous Every 24 hours 06/14/18 0821 06/14/18 1414   06/14/18 0000  vancomycin (VANCOCIN) IVPB 1000 mg/200 mL premix  Status:  Discontinued     1,000 mg 200 mL/hr over 60 Minutes Intravenous Every 12 hours 06/13/18 1907 06/14/18 0821   06/13/18 2300  piperacillin-tazobactam (ZOSYN) IVPB 3.375 g     3.375 g 12.5 mL/hr over 240 Minutes Intravenous Every 8 hours 06/13/18 1903     06/13/18 2015  azithromycin (ZITHROMAX) 500 mg in sodium chloride 0.9 % 250 mL IVPB  Status:  Discontinued     500 mg 250 mL/hr over 60 Minutes Intravenous Every 24 hours 06/13/18 2007 06/16/18 1212   06/13/18 1530  piperacillin-tazobactam (ZOSYN) IVPB 3.375 g     3.375 g 100 mL/hr over 30 Minutes Intravenous  Once 06/13/18 1516 06/13/18 1605   06/13/18 1530  vancomycin (VANCOCIN) IVPB 1000 mg/200 mL premix     1,000 mg 200 mL/hr over 60 Minutes Intravenous  Once 06/13/18 1516 06/13/18 1631       Subjective: Patient seen and examined at bedside.  Still complains of left-sided chest pain.  No overnight worsening shortness of breath, fever or vomiting.  Objective: Vitals:   06/16/18 0834 06/16/18 1325 06/16/18 2317 06/17/18 0613  BP: 134/72 108/61 125/73 127/73  Pulse: 75 69 71 66  Resp:  16 16 16   Temp: 97.8 F (36.6 C)  98.4 F (36.9 C) 97.7 F (36.5 C)   TempSrc:   Oral Oral  SpO2: 96% 95% 92% 93%  Weight:      Height:        Intake/Output Summary (Last 24 hours) at 06/17/2018 1031 Last data filed at 06/16/2018 1727 Gross per 24 hour  Intake 289.82 ml  Output 100 ml  Net 189.82 ml   Filed Weights   06/13/18 1326 06/13/18 2112 06/16/18 0526  Weight: 71 kg 70.8 kg 69 kg    Examination:  General exam: Appears calm and comfortable.  Looks chronically ill.  No acute distress. Respiratory system: Bilateral decreased breath sounds at bases with some scattered crackles more on the left base.  No wheezing Cardiovascular system: Rate controlled, S1 & S2 heardd Gastrointestinal system: Abdomen is nondistended, soft and nontender. Normal bowel sounds heard. Extremities: No cyanosis, edema    Data Reviewed: I have personally reviewed following labs and imaging studies  CBC: Recent Labs  Lab 06/13/18 1402 06/14/18 0155 06/15/18 0305 06/16/18 0313 06/17/18 0329  WBC 12.0* 13.7* 13.4* 14.1* 13.7*  NEUTROABS 6.8  --   --   --  7.5  HGB 13.4 12.3* 12.2* 13.0 12.7*  HCT 42.9 38.2* 38.2* 40.0 38.8*  MCV 91.3 90.5 88.8 88.1 88.2  PLT 537* 492* 492* 517* 694*   Basic Metabolic Panel: Recent Labs  Lab 06/13/18 1402 06/14/18 0155 06/15/18 0305 06/16/18 0313 06/17/18 0329  NA 136 136 136 137 137  K 4.6 3.5 3.7 3.9 3.7  CL 105 105 104 101 102  CO2 21* 24 25 27 27   GLUCOSE 109* 90 82 84 131*  BUN 26* 20 14 14 14   CREATININE 0.98 1.12 1.16 1.15 0.97  CALCIUM 8.3* 8.1* 8.2* 8.8* 8.8*  MG  --   --   --   --  1.7   GFR: Estimated Creatinine Clearance: 62.2 mL/min (by C-G formula based on SCr of 0.97 mg/dL). Liver Function Tests: Recent Labs  Lab 06/13/18 1402  AST 30  ALT 7  ALKPHOS 47  BILITOT 1.2  PROT 8.0  ALBUMIN 2.5*   No results for input(s): LIPASE, AMYLASE in the last 168 hours. No results for input(s): AMMONIA in the last 168 hours. Coagulation Profile: Recent Labs  Lab 06/15/18 0305  INR 1.2   Cardiac  Enzymes: Recent Labs  Lab 06/13/18 1402  TROPONINI <0.03   BNP (last 3 results) No results for input(s): PROBNP in the last 8760 hours. HbA1C: No results for input(s): HGBA1C in the last 72 hours. CBG: No results for input(s): GLUCAP in the last 168 hours. Lipid Profile: No results for input(s): CHOL, HDL, LDLCALC, TRIG, CHOLHDL, LDLDIRECT in the last 72 hours. Thyroid Function Tests: No results for input(s): TSH, T4TOTAL, FREET4, T3FREE, THYROIDAB in the last 72 hours. Anemia Panel: No results for input(s): VITAMINB12, FOLATE, FERRITIN, TIBC, IRON, RETICCTPCT in the last 72 hours. Sepsis Labs: Recent Labs  Lab 06/13/18 1458 06/13/18 1734  LATICACIDVEN 0.8 1.2    Recent Results (from the past 240 hour(s))  Blood Culture (routine x 2)     Status: None (Preliminary result)   Collection Time: 06/13/18  2:59 PM  Result Value Ref Range Status   Specimen Description BLOOD RIGHT HAND  Final   Special Requests  Final    BOTTLES DRAWN AEROBIC AND ANAEROBIC Blood Culture adequate volume   Culture   Final    NO GROWTH 4 DAYS Performed at [facility], [address], [place], [place] [address]    Report Status PENDING  Incomplete  SARS Coronavirus 2 (CEPHEID- Performed in [place] hospital lab), Hosp Order     Status: None   Collection Time: 06/13/18  3:00 PM  Result Value Ref Range Status   SARS Coronavirus 2 NEGATIVE NEGATIVE Final    Comment: (NOTE) If result is NEGATIVE SARS-CoV-2 target nucleic acids are NOT DETECTED. The SARS-CoV-2 RNA is generally detectable in upper and lower  respiratory specimens during the acute phase of infection. The lowest  concentration of SARS-CoV-2 viral copies this assay can detect is 250  copies / mL. A negative result does not preclude SARS-CoV-2 infection  and should not be used as the sole basis for treatment or other  patient management decisions.  A negative result may occur with  improper specimen collection / handling, submission  of specimen other  than nasopharyngeal swab, presence of viral mutation(s) within the  areas targeted by this assay, and inadequate number of viral copies  (<250 copies / mL). A negative result must be combined with clinical  observations, patient history, and epidemiological information. If result is POSITIVE SARS-CoV-2 target nucleic acids are DETECTED. The SARS-CoV-2 RNA is generally detectable in upper and lower  respiratory specimens dur ing the acute phase of infection.  Positive  results are indicative of active infection with SARS-CoV-2.  Clinical  correlation with patient history and other diagnostic information is  necessary to determine patient infection status.  Positive results do  not rule out bacterial infection or co-infection with other viruses. If result is PRESUMPTIVE POSTIVE SARS-CoV-2 nucleic acids MAY BE PRESENT.   A presumptive positive result was obtained on the submitted specimen  and confirmed on repeat testing.  While 2019 novel coronavirus  (SARS-CoV-2) nucleic acids may be present in the submitted sample  additional confirmatory testing may be necessary for epidemiological  and / or clinical management purposes  to differentiate between  SARS-CoV-2 and other Sarbecovirus currently known to infect humans.  If clinically indicated additional testing with an alternate test  methodology [phone]) is advised. The SARS-CoV-2 RNA is generally  detectable in upper and lower respiratory sp ecimens during the acute  phase of infection. The expected result is Negative. Fact Sheet for Patients:  StrictlyIdeas.no Fact Sheet for Healthcare Providers: BankingDealers.co.za This test is not yet approved or cleared by the [place] FDA and has been authorized for detection and/or diagnosis of SARS-CoV-2 by FDA under an Emergency Use Authorization (EUA).  This EUA will remain in effect (meaning this test can be used) for  the duration of the COVID-19 declaration under Section 564(b)(1) of the Act, 21 U.S.C. section 360bbb-3(b)(1), unless the authorization is terminated or revoked sooner. Performed at [facility], [address], [place], [place] [address]   Blood Culture (routine x 2)     Status: Abnormal   Collection Time: 06/13/18  3:00 PM  Result Value Ref Range Status   Specimen Description   Final    BLOOD LEFT HAND Performed at [facility], [address], [place], [place] [address]    Special Requests   Final    Blood Culture adequate volume BOTTLES DRAWN AEROBIC AND ANAEROBIC Performed at [facility], [address], [place], [place] [address]    Culture  Setup Time  Final    GRAM POSITIVE RODS AEROBIC BOTTLE ONLY Gram Stain Report Called to,Read Back By and Verified With: MURRELL,K AT 0715 BU HUFFINES,S ON 06/15/18. Performed at [facility], [address], [place], [place] [address]    Culture (A)  Final    DIPHTHEROIDS(CORYNEBACTERIUM SPECIES) Standardized susceptibility testing for this organism is not available. Performed at [facility], [place] [address], [place], [place] [address]    Report Status 06/16/2018 FINAL  Final  MRSA PCR Screening     Status: None   Collection Time: 06/13/18 11:15 PM  Result Value Ref Range Status   MRSA by PCR NEGATIVE NEGATIVE Final    Comment:        The GeneXpert MRSA Assay (FDA approved for NASAL specimens only), is one component of a comprehensive MRSA colonization surveillance program. It is not intended to diagnose MRSA infection nor to guide or monitor treatment for MRSA infections. Performed at [facility], [place] [address] [place], [place] [address]   Culture, body fluid-bottle     Status: None (Preliminary result)   Collection Time: 06/15/18 10:34 AM  Result Value Ref Range Status   Specimen Description FLUID PLEURAL LEFT  Final   Special Requests NONE  Final   Culture   Final    NO GROWTH 1 DAY Performed at [facility], [place] [address], [place], [place] [address]    Report Status PENDING  Incomplete  Gram stain     Status: None   Collection Time: 06/15/18 10:34 AM  Result Value Ref Range Status   Specimen Description FLUID PLEURAL LEFT  Final   Special Requests NONE  Final   Gram Stain   Final    WBC PRESENT, PREDOMINANTLY MONONUCLEAR NO ORGANISMS SEEN CYTOSPIN SMEAR Performed at [facility], [place] [address] [place], [place] [address]    Report Status 06/15/2018 FINAL  Final         Radiology Studies: Dg Chest 1 View  Result Date: 06/15/2018 CLINICAL DATA:  Status post thoracentesis EXAM: CHEST  1 VIEW COMPARISON:  06/13/2018 FINDINGS: Interval thoracentesis has been performed on the left. No pneumothorax is seen. Persistent infiltrates are noted particularly in the mid and upper left lung. Emphysematous changes are noted. Right chest wall port is seen. IMPRESSION: No evidence of pneumothorax following thoracentesis. Electronically Signed   By: Inez Catalina M.D.   On: 06/15/2018 11:04   Dg Chest 2 View  Result Date: 06/17/2018 CLINICAL DATA:  Shortness of breath and left-sided chest pain EXAM: CHEST - 2 VIEW COMPARISON:  06/16/2018 FINDINGS: Cardiac shadow is stable. Right chest wall port is again seen. Diffuse airspace density is again noted in left upper lobe stable from the prior exam. No enlarging pleural fluid is seen. Chronic fibrotic changes are seen bilaterally stable from the previous exam. No bony abnormality is noted. IMPRESSION: Stable predominantly left upper lobe density superimposed over diffuse chronic fibrotic change. No enlarging effusion is seen. Electronically Signed   By: Inez Catalina M.D.   On: 06/17/2018 09:13   Dg Chest 2 View  Result Date: 06/16/2018 CLINICAL DATA:  Pleural effusion EXAM: CHEST - 2 VIEW COMPARISON:  Yesterday FINDINGS: Airspace disease with volume loss on the left. There is left pleural fluid by CT without evidence of increase. Normal heart  size. Porta catheter in stable position IMPRESSION: 1. Stable extensive airspace disease on the left. No evidence of increasing pleural fluid. 2. COPD. Electronically Signed   By:  Monte Fantasia M.D.   On: 06/16/2018 10:06        Scheduled Meds: . amLODipine  5 mg Oral Daily  . aspirin EC  81 mg Oral Daily  . buPROPion  150 mg Oral BID  . celecoxib  200 mg Oral Daily  . cyclobenzaprine  5 mg Oral BID  . enoxaparin (LOVENOX) injection  40 mg Subcutaneous Q24H  . ferrous sulfate  325 mg Oral Daily  . fluticasone  2 spray Each Nare Daily  . gabapentin  300 mg Oral TID  . metoprolol succinate  25 mg Oral Daily  . mirabegron ER  25 mg Oral QHS  . montelukast  10 mg Oral QHS  . pantoprazole  40 mg Oral Daily  . simvastatin  10 mg Oral QHS   Continuous Infusions: . piperacillin-tazobactam (ZOSYN)  IV 3.375 g (06/17/18 0533)     LOS: 4 days        Aline August, MD [facility] 06/17/2018, 10:31 AM

## 2018-06-17 NOTE — Progress Notes (Signed)
PT Cancellation Note  Patient Details Name: Bradley [name] MRN: [id number] DOB: Feb 25, 1940   Cancelled Treatment:    Reason Eval/Treat Not Completed: Patient declined, no reason specified Patient declined participating in therapy at this time due to pain "every time I take a deep breath". Pt reports requesting pain medication prior to arrival. PT will continue to follow acutely.    Earney Navy, PTA Acute Rehabilitation Services Pager: [phone] Office: [phone]   06/17/2018, 4:07 PM

## 2018-06-17 NOTE — Progress Notes (Addendum)
  Subjective: Resting in bed. Awake and alert, no new problerms related to his breathing.  Objective: Vital signs in last 24 hours: Temp:  [97.7 F (36.5 C)-98.4 F (36.9 C)] 97.7 F (36.5 C) (05/21 7619) Pulse Rate:  [66-71] 66 (05/21 0613) Resp:  [16] 16 (05/21 0613) BP: (108-127)/(61-73) 127/73 (05/21 5093) SpO2:  [92 %-95 %] 93 % (05/21 2671)    Intake/Output from previous day: 05/20 0701 - 05/21 0700 In: 951.1 [P.O.:444; IV Piggyback:507.1] Out: 350 [Urine:350] Intake/Output this shift: No intake/output data recorded.  General appearance: alert, cooperative and no distress Heart: regular rate and rhythm Lungs: Coarse breath sounds bilat,  CXR stable, no PTX, no re-accumulation of pleural fluid.   EXAM: CHEST - 2 VIEW  COMPARISON:  06/16/2018  FINDINGS: Cardiac shadow is stable. Right chest wall port is again seen. Diffuse airspace density is again noted in left upper lobe stable from the prior exam. No enlarging pleural fluid is seen. Chronic fibrotic changes are seen bilaterally stable from the previous exam. No bony abnormality is noted.  IMPRESSION: Stable predominantly left upper lobe density superimposed over diffuse chronic fibrotic change.  No enlarging effusion is seen.   Electronically Signed   By: Inez Catalina M.D.   On: 06/17/2018 09:13  Lab Results: Recent Labs    06/16/18 0313 06/17/18 0329  WBC 14.1* 13.7*  HGB 13.0 12.7*  HCT 40.0 38.8*  PLT 517* 513*   BMET:  Recent Labs    06/16/18 0313 06/17/18 0329  NA 137 137  K 3.9 3.7  CL 101 102  CO2 27 27  GLUCOSE 84 131*  BUN 14 14  CREATININE 1.15 0.97  CALCIUM 8.8* 8.8*    PT/INR:  Recent Labs    06/15/18 0305  LABPROT 14.6  INR 1.2   ABG    Component Value Date/Time   TCO2 25 03/15/2016 0747   CBG (last 3)  No results for input(s): GLUCAP in the last 72 hours.  Assessment/Plan:  -Day 2 post left thoracentesis in a 78yo male with stage IV lung  adenocarcinoma with 1476ml fluid removed. Cytology showed no malignant cells. Fluid cx negative at 2 days.  CXR stable.  Repeat CXR in am.    LOS: 4 days    Antony Odea 06/17/2018  L thoracentesis appears to be effective on clearing the pleural space- CXR today shows chronic airspace disease and pleural thickening. One more CXR tomorrow.  Patient probably will not need surgery since effusion not malignant  patient examined and medical record reviewed,agree with above note. Tharon Aquas Trigt III 06/17/2018

## 2018-06-17 NOTE — Progress Notes (Signed)
Pharmacy Antibiotic Note  Bradley [name] is a 78 y.o. male admitted on 06/13/2018 with pneumonia.  Pharmacy has been consulted for  Zosyn dosing.   Patient day #5 of zosyn and now s/p thoracentesis. Scr stable. WBC 13.7.   Plan: - Continue Zosyn 3.375g IV every 8 hours - Will continue to follow renal function, culture results, LOT, and antibiotic de-escalation plans   Height: 6' (182.9 cm) Weight: 152 lb 1.9 oz (69 kg) IBW/kg (Calculated) : 77.6  Temp (24hrs), Avg:98.1 F (36.7 C), Min:97.7 F (36.5 C), Max:98.4 F (36.9 C)  Recent Labs  Lab 06/13/18 1402 06/13/18 1458 06/13/18 1734 06/14/18 0155 06/15/18 0305 06/16/18 0313 06/17/18 0329  WBC 12.0*  --   --  13.7* 13.4* 14.1* 13.7*  CREATININE 0.98  --   --  1.12 1.16 1.15 0.97  LATICACIDVEN  --  0.8 1.2  --   --   --   --     Estimated Creatinine Clearance: 62.2 mL/min (by C-G formula based on SCr of 0.97 mg/dL).    Allergies  Allergen Reactions  . Aspirin     Regular asa     Antimicrobials this admission: 5/17 vancomycin >> 5/18  5/17 zosyn >>   Microbiology results: 5/17 BCx: ngtd 5/17 MRSA PCR: neg 5/17 covid 19 neg  Bradley [name], PharmD, [place] Please utilize Amion for appropriate phone number to reach the unit pharmacist ([place])

## 2018-06-18 ENCOUNTER — Inpatient Hospital Stay (HOSPITAL_COMMUNITY): Payer: Medicare Other

## 2018-06-18 LAB — CULTURE, BLOOD (ROUTINE X 2)
Culture: NO GROWTH
Special Requests: ADEQUATE

## 2018-06-18 MED ORDER — POLYETHYLENE GLYCOL 3350 17 G PO PACK: 17.0000 g | PACK | Freq: Every day | ORAL | 0 refills | Status: DC | PRN

## 2018-06-18 MED ORDER — SENNA 8.6 MG PO TABS: 1.0000 | ORAL_TABLET | Freq: Every day | ORAL | 0 refills | Status: DC

## 2018-06-18 MED ORDER — AMOXICILLIN-POT CLAVULANATE 875-125 MG PO TABS: 1.0000 | ORAL_TABLET | Freq: Two times a day (BID) | ORAL | 0 refills | Status: DC

## 2018-06-18 MED ORDER — LISINOPRIL 20 MG PO TABS: 20.0000 mg | ORAL_TABLET | Freq: Every day | ORAL | Status: DC

## 2018-06-18 NOTE — TOC Transition Note (Addendum)
Transition of Care [facility]) - CM/SW Discharge Note   Patient Details  Name: Bradley [name] MRN: [id number] Date of Birth: Jan 27, 1941  Transition of Care [name] CM/SW Contact:  Sharin Mons, RN Phone Number: 06/18/2018, 10:43 AM   Clinical Narrative:    Transition to home today with home health services. [facility] to provide home health services (RN,PT), after choice given and selected by pt. Daughter to provide transportation to home. Pt states PTA  Independent with ADL's and owns a walker.  Jeanie Sewer (Daughter)     [phone]      06/18/2018 @ 1414 Referral made with [facility] for outpatient palliative care. Office to f/u with pt and pt's PCP.  Final next level of care: [place] Barriers to Discharge: Barriers Resolved   Patient Goals and CMS Choice Patient states their goals for this hospitalization and ongoing recovery are:: to go home CMS Medicare.gov Compare Post Acute Care list provided to:: Patient Choice offered to / list presented to : Patient  Discharge Placement                       Discharge Plan and Services In-house Referral: NA Discharge Planning Services: CM Consult Post Acute Care Choice: NA          DME Arranged: N/A DME Agency: NA       HH Arranged: PT, RN [place] Agency: [place] ([place]) Date HH Agency Contacted: 06/18/18 Time [place]: 1039 Representative spoke with at [place]: [place] ([place]) Interventions     Readmission Risk Interventions No flowsheet data found.

## 2018-06-18 NOTE — Progress Notes (Addendum)
  Subjective: Awake and alert, says he rested well last night.  Reports no change in his breathing. Left lateral chest pain about the same.  Objective: Vital signs in last 24 hours: Temp:  [97.7 F (36.5 C)-98.2 F (36.8 C)] 98.2 F (36.8 C) (05/22 0605) Pulse Rate:  [66-69] 69 (05/22 0605) Resp:  [14-18] 14 (05/22 0605) BP: (117-137)/(69-78) 137/74 (05/22 0605) SpO2:  [83 %-96 %] 96 % (05/22 0605)     Intake/Output from previous day: 05/21 0701 - 05/22 0700 In: 168 [P.O.:118; IV Piggyback:50] Out: 200 [Urine:200] Intake/Output this shift: Total I/O In: 480 [P.O.:480] Out: -   General appearance: alert, cooperative and no distress Heart: regular rate and rhythm Lungs: Coarse breath sounds bilat., unchanged.  CXR stable, no PTX, no re-accumulation of pleural fluid, slightly less aeration I the left base.    CLINICAL DATA:  Thoracentesis follow-up  EXAM: PORTABLE CHEST 1 VIEW  COMPARISON:  Yesterday  FINDINGS: Port on the right with tip at the SVC.  Unchanged extensive opacification with volume loss and pleural fluid/thickening at the apex. This was characterized by chest CT 06/13/2018. Normal heart size. Emphysema.  IMPRESSION: Unchanged extensive left-sided airspace disease superimposed on advanced emphysema. No increasing pleural fluid.   Electronically Signed   By: Monte Fantasia M.D.   On: 06/18/2018 09:03   Lab Results: Recent Labs    06/16/18 0313 06/17/18 0329  WBC 14.1* 13.7*  HGB 13.0 12.7*  HCT 40.0 38.8*  PLT 517* 513*   BMET:  Recent Labs    06/16/18 0313 06/17/18 0329  NA 137 137  K 3.9 3.7  CL 101 102  CO2 27 27  GLUCOSE 84 131*  BUN 14 14  CREATININE 1.15 0.97  CALCIUM 8.8* 8.8*    PT/INR: No results for input(s): LABPROT, INR in the last 72 hours. ABG    Component Value Date/Time   TCO2 25 03/15/2016 0747   CBG (last 3)  No results for input(s): GLUCAP in the last 72 hours.  Assessment/Plan:  -Day 3 post  left thoracentesis in a 78yo male with stage IV lung adenocarcinoma with 1452ml fluid removed. Cytology showed no malignant cells. Fluid cx negative at 2 days.  Serial CXR's have shown no significant accumulation of pleural fluid. No indication currently for any further surgical intervention.  We will be available as needed.      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  Pleural fluid cultures negative Patient patient could probably transition to oral antibiotics and discharge home soon..  We will see patient back in the office in about 10 days to assess his left pleural disease.  Dahlia Byes MD

## 2018-06-18 NOTE — Plan of Care (Signed)
Pt received discharge instructions able to teach back.

## 2018-06-18 NOTE — Progress Notes (Addendum)
Designer, [facility]ellery Based Palliative Care  Received request from [facility] for patient/family interest in [place] after discharge. Chart reviewed and spoke with patient's daughter Freda Munro by phone to confirm interest, explain services, determine visit type and appointment day and time.   Spoke with RNCM Levada Dy to make aware of arrangements.   Thank you,  Erling Conte, LCSW [phone]

## 2018-06-18 NOTE — Consult Note (Signed)
   [facility] Inpatient Consult   06/18/2018  Kaniel Kiang [birth year] [phone]    Patient assessed if potential [place] [facility] services needed bythis [employer] memberwith 22% high risk for unplanned readmission and 2 hospitalizations in past 6 months.  Per chart review and MD's brief/interim summary on 06/18/18 states as follows:  Patient is a 78 year old male with history of COPD on 3 L oxygen via nasal cannula at home, hypertension, coronary artery disease, stage IV non-small cell lung cancer currently on chemotherapy. He presented with left-sided chest pain along with difficulty breathing and yellow sputum. Patient was transferred from [facility] to [facility] for Pneumonia and associated loculated pleural effusion. He underwent ultrasound-guided thoracentesis on 06/15/2018 with 1450 mL of fluid removed from the left chest. He has been on intravenous antibiotics. Cardiothoracic surgery followed patient with repeat chest x-rays, hemoglobin stable. He will be discharged on oral Augmentin.  Per palliative consult note, patient lives in a house with 6 women and is well cared for by his daughter Freda Munro). Palliative care will follow as Outpatient on discharge.  Spoke with transition of care RN CM regarding discharge disposition and needs, states that patient is going Home with home health services (PT, RN) provided by [facility] and Outpatient Palliative services with Manufacturing engineer.  Called and spoke with patient via hospital room phone (HIPAA verified) and explained [facility] care management services, however, patient states " I don't know what else could be done for me 'coz my daughter is taking good care of me". Patient states having a "stage 4 lung cancer that spread out to liver". He reports that he has an appointment to follow-up with his oncologist on Tuesday (5/26) and appointment with PCP on 07/06/18. He denies any needs or barriers with  medications, pharmacy ([facility]) and transportation (has niece and grandson if needed). Patient's primarycareprovider isDr.James Kim with [employer].  Patientagreed he willbenefit from [facility] calls to follow-up hiscontinued recovery.   Referral made for EMMI Pneumonia calls post discharge.   For questions and additional information, please call:  Roselind Klus A. Edward Guthmiller, BSN, RN-BC [facility] Liaison Cell: [phone]

## 2018-06-18 NOTE — Progress Notes (Signed)
Nsg Discharge Note  Admit Date:  06/13/2018 Discharge date: 06/18/2018   Bradley [name] to be D/C'd Home per MD order.  AVS completed.  Copy for chart, and copy for patient signed, and dated. Patient/caregiver able to verbalize understanding.  Discharge Medication: Allergies as of 06/18/2018      Reactions   Aspirin    Regular asa      Medication List    STOP taking these medications   buPROPion 150 MG 12 hr tablet Commonly known as:  WELLBUTRIN SR     TAKE these medications   Advair HFA 115-21 MCG/ACT inhaler Generic drug:  fluticasone-salmeterol Inhale 2 puffs into the lungs every 12 (twelve) hours.   amLODipine 5 MG tablet Commonly known as:  NORVASC TAKE 1 TABLET BY MOUTH EVERY DAY   amoxicillin-clavulanate 875-125 MG tablet Commonly known as:  Augmentin Take 1 tablet by mouth 2 (two) times daily for 7 days.   aspirin EC 81 MG tablet Take 81 mg by mouth daily.   celecoxib 200 MG capsule Commonly known as:  CELEBREX Take 1 capsule (200 mg total) by mouth daily.   cyclobenzaprine 5 MG tablet Commonly known as:  FLEXERIL Take 5 mg by mouth 2 (two) times daily.   fluticasone 50 MCG/ACT nasal spray Commonly known as:  FLONASE Place 2 sprays into both nostrils daily.   gabapentin 300 MG capsule Commonly known as:  NEURONTIN Take 1 capsule by mouth 3 (three) times daily.   HM Potassium 595 (99 K) MG Tabs tablet Generic drug:  potassium gluconate Take 595 mg by mouth daily.   ipratropium-albuterol 0.5-2.5 (3) MG/3ML Soln Commonly known as:  DUONEB Take 3 mLs by nebulization 3 (three) times daily.   Iron 325 (65 Fe) MG Tabs Take 1 tablet by mouth daily.   lidocaine-prilocaine cream Commonly known as:  EMLA Apply 1 application topically as needed. Apply a small amount to port site and cover with plastic wrap 1 hour prior to infusion appointments   lisinopril 20 MG tablet Commonly known as:  ZESTRIL Take 1 tablet (20 mg total) by mouth daily.    metoCLOPramide 5 MG tablet Commonly known as:  REGLAN Take 1 tablet by mouth 4 (four) times daily -  before meals and at bedtime.   metoprolol succinate 25 MG 24 hr tablet Commonly known as:  TOPROL-XL Take 25 mg by mouth daily.   montelukast 10 MG tablet Commonly known as:  SINGULAIR Take 1 tablet by mouth daily.   multivitamin with minerals tablet Take 1 tablet by mouth daily.   Myrbetriq 25 MG Tb24 tablet Generic drug:  mirabegron ER Take 25 mg by mouth at bedtime.   nitroGLYCERIN 0.4 MG SL tablet Commonly known as:  NITROSTAT Place 0.4 mg under the tongue every 5 (five) minutes as needed for chest pain.   omeprazole 40 MG capsule Commonly known as:  PRILOSEC Take 40 mg by mouth daily.   OPDIVO IV Inject into the vein every 14 (fourteen) days.   oxyCODONE 5 MG immediate release tablet Commonly known as:  Oxy IR/ROXICODONE Take 1 tablet (5 mg total) by mouth every 4 (four) hours as needed for severe pain.   polyethylene glycol 17 g packet Commonly known as:  MIRALAX / GLYCOLAX Take 17 g by mouth daily as needed for mild constipation.   senna 8.6 MG Tabs tablet Commonly known as:  SENOKOT Take 1 tablet (8.6 mg total) by mouth daily.   simvastatin 10 MG tablet Commonly known as:  ZOCOR  Take 10 mg by mouth at bedtime.   Ventolin HFA 108 (90 Base) MCG/ACT inhaler Generic drug:  albuterol Inhale 2 puffs into the lungs every 4 (four) hours as needed for wheezing or shortness of breath.   Vitamin C 500 MG Caps Take 500 mg by mouth daily.   YERVOY IV Inject into the vein every 6 (six) weeks.       Discharge Assessment: Vitals:   06/17/18 2214 06/18/18 0605  BP: 117/78 137/74  Pulse: 69 69  Resp: 18 14  Temp: 97.8 F (36.6 C) 98.2 F (36.8 C)  SpO2: (!) 83% 96%   Skin clean, dry and intact without evidence of skin break down, no evidence of skin tears noted. IV catheter discontinued intact. Site without signs and symptoms of complications - no redness  or edema noted at insertion site, patient denies c/o pain - only slight tenderness at site.  Dressing with slight pressure applied.  D/c Instructions-Education: Discharge instructions given to patient with verbalized understanding. D/c education completed with patient including follow up instructions, medication list, d/c activities limitations if indicated, with other d/c instructions as indicated by MD - patient able to verbalize understanding, all questions fully answered. Patient instructed to return to ED, call 911, or call MD for any changes in condition.  Patient escorted via [place], and D/C home via private auto.  Hassan Rowan, RN 06/18/2018 6:38 PM

## 2018-06-18 NOTE — Discharge Summary (Signed)
Physician Discharge Summary  Bradley [name] SEG:[id number] DOB: 14-Jan-1941 DOA: 06/13/2018  PCP: Bradley Gravel, MD  Admit date: 06/13/2018 Discharge date: 06/18/2018  Admitted From: Home Disposition: Home  Recommendations for Outpatient Follow-up:  1. Follow up with PCP in 1 week with repeat CBC/BMP 2. Outpatient follow-up with oncology at earliest convenience.  Patient might need outpatient palliative care follow-up 3. Outpatient follow-up with cardiothoracic surgery 4. 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  Patient was transferred from [facility] to [facility] for pneumonia and associated loculated pleural effusion.  He underwent ultrasound-guided thoracentesis on 06/15/2018 with 1450 mL of fluid removed from the left chest.  He has been on intravenous antibiotics.  Cardiothoracic surgery followed the patient and repeat chest x-rays hemoglobin stable.  Cardiothoracic surgery has cleared the patient for discharge.  He will be discharged on oral Augmentin.   Discharge Diagnoses:  Active Problems:   Pneumonia   Metastatic cancer [name]   Palliative care encounter   Encounter for hospice care discussion  Community-acquired pneumonia with loculated pleural effusion -CTA chest negative for PE, showed extensive airspace disease on the left with loculated pleural effusion -Treated with Zosyn and Zithromax initially and subsequently Zithromax was discontinued. -Cultures negative so far -Status post ultrasound-guided thoracentesis on 06/15/2018 with removal of 1450  mL from left chest.    Cultures negative.  Cardiothoracic surgery following.  Repeat chest x-rays have not shown worsening pleural effusion.  Cardiothoracic surgery has cleared the patient for discharge on oral antibiotics.  Will discharge him home on Augmentin for 7 more days.  Outpatient follow-up with cardiothoracic surgery. -Preadmission SARS-CoV-2 test negative  Leukocytosis -From above.  Improving.  Diphtheroids bacteremia -Most likely contaminant.  No further work-up needed.  Stage IV non-small cell lung cancer COPD with chronic hypoxic respiratory failure Tobacco abuse -CT chest was suspicious for recurrent lung tumor along with new liver lesions compatible with metastatic disease.  Follows up with Bradley [name]/oncology.  Currently on chemotherapy as an outpatient -Still smoking and has no plans to quit. -Continue oxygen supplementation. -Palliative care evaluation appreciated.  Patient will follow-up with his regular oncologist and discuss further options including hospice.  Overall prognosis is guarded to poor.  Hypertension -Stable.  Continue Norvasc and lisinopril. Discharge Instructions  Discharge Instructions    Ambulatory referral to Cardiothoracic Surgery   Complete by:  As directed    Follow up on pleural effusion   Ambulatory referral to Oncology   Complete by:  As directed    followup   Call MD for:  difficulty breathing, headache or visual disturbances   Complete by:  As directed    Call MD for:  extreme fatigue   Complete by:  As directed    Call MD for:  persistant dizziness or light-headedness   Complete by:  As directed    Diet - low sodium heart healthy   Complete by:  As directed    Increase activity slowly   Complete by:  As directed      Allergies as of 06/18/2018      Reactions   Aspirin    Regular asa  Medication List    STOP taking these medications   buPROPion 150 MG 12 hr tablet Commonly known as:  WELLBUTRIN SR     TAKE  these medications   Advair HFA 115-21 MCG/ACT inhaler Generic drug:  fluticasone-salmeterol Inhale 2 puffs into the lungs every 12 (twelve) hours.   amLODipine 5 MG tablet Commonly known as:  NORVASC TAKE 1 TABLET BY MOUTH EVERY DAY   amoxicillin-clavulanate 875-125 MG tablet Commonly known as:  Augmentin Take 1 tablet by mouth 2 (two) times daily for 7 days.   aspirin EC 81 MG tablet Take 81 mg by mouth daily.   celecoxib 200 MG capsule Commonly known as:  CELEBREX Take 1 capsule (200 mg total) by mouth daily.   cyclobenzaprine 5 MG tablet Commonly known as:  FLEXERIL Take 5 mg by mouth 2 (two) times daily.   fluticasone 50 MCG/ACT nasal spray Commonly known as:  FLONASE Place 2 sprays into both nostrils daily.   gabapentin 300 MG capsule Commonly known as:  NEURONTIN Take 1 capsule by mouth 3 (three) times daily.   HM Potassium 595 (99 K) MG Tabs tablet Generic drug:  potassium gluconate Take 595 mg by mouth daily.   ipratropium-albuterol 0.5-2.5 (3) MG/3ML Soln Commonly known as:  DUONEB Take 3 mLs by nebulization 3 (three) times daily.   Iron 325 (65 Fe) MG Tabs Take 1 tablet by mouth daily.   lidocaine-prilocaine cream Commonly known as:  EMLA Apply 1 application topically as needed. Apply a small amount to port site and cover with plastic wrap 1 hour prior to infusion appointments   lisinopril 20 MG tablet Commonly known as:  ZESTRIL Take 1 tablet (20 mg total) by mouth daily.   metoCLOPramide 5 MG tablet Commonly known as:  REGLAN Take 1 tablet by mouth 4 (four) times daily -  before meals and at bedtime.   metoprolol succinate 25 MG 24 hr tablet Commonly known as:  TOPROL-XL Take 25 mg by mouth daily.   montelukast 10 MG tablet Commonly known as:  SINGULAIR Take 1 tablet by mouth daily.   multivitamin with minerals tablet Take 1 tablet by mouth daily.   Myrbetriq 25 MG Tb24 tablet Generic drug:  mirabegron ER Take 25 mg by mouth at  bedtime.   nitroGLYCERIN 0.4 MG SL tablet Commonly known as:  NITROSTAT Place 0.4 mg under the tongue every 5 (five) minutes as needed for chest pain.   omeprazole 40 MG capsule Commonly known as:  PRILOSEC Take 40 mg by mouth daily.   OPDIVO IV Inject into the vein every 14 (fourteen) days.   oxyCODONE 5 MG immediate release tablet Commonly known as:  Oxy IR/ROXICODONE Take 1 tablet (5 mg total) by mouth every 4 (four) hours as needed for severe pain.   polyethylene glycol 17 g packet Commonly known as:  MIRALAX / GLYCOLAX Take 17 g by mouth daily as needed for mild constipation.   senna 8.6 MG Tabs tablet Commonly known as:  SENOKOT Take 1 tablet (8.6 mg total) by mouth daily.   simvastatin 10 MG tablet Commonly known as:  ZOCOR Take 10 mg by mouth at bedtime.   Ventolin HFA 108 (90 Base) MCG/ACT inhaler Generic drug:  albuterol Inhale 2 puffs into the lungs every 4 (four) hours as needed for wheezing or shortness of breath.   Vitamin C 500 MG Caps Take 500 mg by mouth daily.   YERVOY IV Inject into the vein every 6 (six) weeks.  Follow-up Information    Bradley Gravel, MD. Schedule an appointment as soon as possible for a visit in 1 week(s).   Specialty:  Internal Medicine Contact information: [place] [address] [phone]        Herminio Commons, MD .   Specialty:  Cardiology Contact information: [place] [address] [phone]        Derek Jack, MD Follow up.   Specialty:  Hematology Why:  At earliest convenience Contact information: [place] [address] [phone]          Allergies  Allergen Reactions  . Aspirin     Regular asa     Consultations:  CVTS/IR/palliative care   Procedures/Studies: Dg Chest 1 View  Result Date: 06/15/2018 CLINICAL DATA:  Status post thoracentesis EXAM: CHEST  1 VIEW COMPARISON:  06/13/2018 FINDINGS: Interval  thoracentesis has been performed on the left. No pneumothorax is seen. Persistent infiltrates are noted particularly in the mid and upper left lung. Emphysematous changes are noted. Right chest wall port is seen. IMPRESSION: No evidence of pneumothorax following thoracentesis. Electronically Signed   By: Inez Catalina M.D.   On: 06/15/2018 11:04   Dg Chest 2 View  Result Date: 06/17/2018 CLINICAL DATA:  Shortness of breath and left-sided chest pain EXAM: CHEST - 2 VIEW COMPARISON:  06/16/2018 FINDINGS: Cardiac shadow is stable. Right chest wall port is again seen. Diffuse airspace density is again noted in left upper lobe stable from the prior exam. No enlarging pleural fluid is seen. Chronic fibrotic changes are seen bilaterally stable from the previous exam. No bony abnormality is noted. IMPRESSION: Stable predominantly left upper lobe density superimposed over diffuse chronic fibrotic change. No enlarging effusion is seen. Electronically Signed   By: Inez Catalina M.D.   On: 06/17/2018 09:13   Dg Chest 2 View  Result Date: 06/16/2018 CLINICAL DATA:  Pleural effusion EXAM: CHEST - 2 VIEW COMPARISON:  Yesterday FINDINGS: Airspace disease with volume loss on the left. There is left pleural fluid by CT without evidence of increase. Normal heart size. Porta catheter in stable position IMPRESSION: 1. Stable extensive airspace disease on the left. No evidence of increasing pleural fluid. 2. COPD. Electronically Signed   By: Monte Fantasia M.D.   On: 06/16/2018 10:06   Ct Angio Chest Pe W And/or Wo Contrast  Result Date: 06/13/2018 CLINICAL DATA:  Chest pain, rule out pulmonary embolism. History of lung cancer treated with immune therapy EXAM: CT ANGIOGRAPHY CHEST WITH CONTRAST TECHNIQUE: Multidetector CT imaging of the chest was performed using the standard protocol during bolus administration of intravenous contrast. Multiplanar CT image reconstructions and MIPs were obtained to evaluate the vascular  anatomy. CONTRAST:  56mL OMNIPAQUE IOHEXOL 350 MG/ML SOLN COMPARISON:  Chest x-ray 06/13/2018.  PET 02/15/2018 FINDINGS: Cardiovascular: Negative for pulmonary embolism. Atherosclerotic aortic arch without aneurysm. Heart size normal. No pericardial effusion Mediastinum/Nodes: Right hilar adenopathy. Multiple enlarged lymph nodes in the right hilum measuring up to 2 cm each. Anterior mediastinal mass 38 x 50 mm not present on the prior PET. This is concerning for recurrent tumor. Anterior mediastinal lymph node 10 mm. Peri carina lymph node 15 mm on the left. Subcarinal lymph node 14 mm. Lungs/Pleura: Moderately large loculated left effusion. Extensive airspace disease in the left lung could represent pneumonia however given history of carcinoma, close imaging follow-up is recommended. Severe emphysema. Right upper lobe density is unchanged may represent scarring. No pleural  effusion on the right. Upper Abdomen: Interval development of new liver lesions compatible with metastatic disease. 26 x 14 mm lesion in the posterior right lobe liver. 42 x 25 mm lesion in the medial caudal liver. Central liver lesion positive on PET is difficult to see on the current study. Calcified gallstone. Musculoskeletal: Chronic compression fracture approximately T11. No acute skeletal abnormality. Review of the MIP images confirms the above findings. IMPRESSION: 1. Negative for pulmonary embolism 2. Loculated left pleural effusion with extensive airspace disease on the left likely pneumonia. Given history of lung cancer close follow-up recommended 3. Right hilar adenopathy, showing increased uptake on PET. Peri carina lymph nodes also showing uptake on PET. 4. Anterior mediastinal mass is new and suspicious for recurrent tumor 5. New liver lesions compatible with metastatic disease. Electronically Signed   By: Franchot Gallo M.D.   On: 06/13/2018 17:23   Dg Chest Port 1 View  Result Date: 06/18/2018 CLINICAL DATA:  Thoracentesis  follow-up EXAM: PORTABLE CHEST 1 VIEW COMPARISON:  Yesterday FINDINGS: Port on the right with tip at the SVC. Unchanged extensive opacification with volume loss and pleural fluid/thickening at the apex. This was characterized by chest CT 06/13/2018. Normal heart size. Emphysema. IMPRESSION: Unchanged extensive left-sided airspace disease superimposed on advanced emphysema. No increasing pleural fluid. Electronically Signed   By: Monte Fantasia M.D.   On: 06/18/2018 09:03   Dg Chest Port 1 View  Result Date: 06/13/2018 CLINICAL DATA:  Chest pain. Patient has history of stage IV lung and liver cancer. EXAM: PORTABLE CHEST 1 VIEW COMPARISON:  April 23, 2018 FINDINGS: Significant opacification of the left hemithorax is a new finding. Stable right Port-A-Cath. No change in the cardiomediastinal silhouette. Chronic emphysematous changes are again seen on the right. No suspicious nodules or masses on the right. IMPRESSION: 1. Widespread opacification in the left hemithorax is a new in the interval and nonspecific. If the patient has signs of infection, widespread pneumonia should be considered. Electronically Signed   By: Dorise Bullion III M.D   On: 06/13/2018 14:25   Ir Thoracentesis Asp Pleural Space W/img Guide  Result Date: 06/15/2018 INDICATION: 78 year old with lung cancer. Left chest wall pain, shortness of breath and left pleural effusion. Request for chest tube placement. EXAM: ULTRASOUND GUIDED LEFT THORACENTESIS MEDICATIONS: None. COMPLICATIONS: None immediate. PROCEDURE: Left chest was evaluated with ultrasound. Simple left pleural fluid was identified. I felt the patient would benefit from thoracentesis rather than chest tube placement at this time. Discussed with Dr. Prescott Gum and we decided to proceed with only ultrasound-guided thoracentesis. An ultrasound guided thoracentesis was thoroughly discussed with the patient and questions answered. The benefits, risks, alternatives and complications  were also discussed. The patient understands and wishes to proceed with the procedure. Written consent was obtained. Ultrasound was performed to localize and mark an adequate pocket of fluid in the left chest. The area was then prepped and draped in the normal sterile fashion. 1% Lidocaine was used for local anesthesia. Under ultrasound guidance a 6 Fr Safe-T-Centesis catheter was introduced. Thoracentesis was performed. The catheter was removed and a dressing applied. FINDINGS: A total of approximately 1.45 L of dark yellow fluid was removed. Samples were sent to the laboratory as requested by the clinical team. IMPRESSION: Successful ultrasound guided left thoracentesis yielding 1.45 L of pleural fluid. Electronically Signed   By: Markus Daft M.D.   On: 06/15/2018 11:29       Subjective: Patient seen and examined at bedside.  He denies  any worsening shortness of breath.  Still complains of left-sided chest pain.  No overnight fever or vomiting.  Discharge Exam: Vitals:   06/17/18 2214 06/18/18 0605  BP: 117/78 137/74  Pulse: 69 69  Resp: 18 14  Temp: 97.8 F (36.6 C) 98.2 F (36.8 C)  SpO2: (!) 83% 96%    General: Pt is alert, awake, not in acute distress.  Poor historian.  Looks chronically ill Cardiovascular: rate controlled, S1/S2 + Respiratory: bilateral decreased breath sounds at bases with scattered crackles Abdominal: Soft, NT, ND, bowel sounds + Extremities: no edema, no cyanosis    The results of significant diagnostics from this hospitalization (including imaging, microbiology, ancillary and laboratory) are listed below for reference.     Microbiology: Recent Results (from the past 240 hour(s))  Blood Culture (routine x 2)     Status: None   Collection Time: 06/13/18  2:59 PM  Result Value Ref Range Status   Specimen Description BLOOD RIGHT HAND  Final   Special Requests   Final    BOTTLES DRAWN AEROBIC AND ANAEROBIC Blood Culture adequate volume   Culture   Final     NO GROWTH 5 DAYS Performed at [facility], [address], [place], [place] [address]    Report Status 06/18/2018 FINAL  Final  SARS Coronavirus 2 (CEPHEID- Performed in [facility] hospital lab), Hosp Order     Status: None   Collection Time: 06/13/18  3:00 PM  Result Value Ref Range Status   SARS Coronavirus 2 NEGATIVE NEGATIVE Final    Comment: (NOTE) If result is NEGATIVE SARS-CoV-2 target nucleic acids are NOT DETECTED. The SARS-CoV-2 RNA is generally detectable in upper and lower  respiratory specimens during the acute phase of infection. The lowest  concentration of SARS-CoV-2 viral copies this assay can detect is 250  copies / mL. A negative result does not preclude SARS-CoV-2 infection  and should not be used as the sole basis for treatment or other  patient management decisions.  A negative result may occur with  improper specimen collection / handling, submission of specimen other  than nasopharyngeal swab, presence of viral mutation(s) within the  areas targeted by this assay, and inadequate number of viral copies  (<250 copies / mL). A negative result must be combined with clinical  observations, patient history, and epidemiological information. If result is POSITIVE SARS-CoV-2 target nucleic acids are DETECTED. The SARS-CoV-2 RNA is generally detectable in upper and lower  respiratory specimens dur ing the acute phase of infection.  Positive  results are indicative of active infection with SARS-CoV-2.  Clinical  correlation with patient history and other diagnostic information is  necessary to determine patient infection status.  Positive results do  not rule out bacterial infection or co-infection with other viruses. If result is PRESUMPTIVE POSTIVE SARS-CoV-2 nucleic acids MAY BE PRESENT.   A presumptive positive result was obtained on the submitted specimen  and confirmed on repeat testing.  While 2019 novel coronavirus  (SARS-CoV-2) nucleic acids may be  present in the submitted sample  additional confirmatory testing may be necessary for epidemiological  and / or clinical management purposes  to differentiate between  SARS-CoV-2 and other Sarbecovirus currently known to infect humans.  If clinically indicated additional testing with an alternate test  methodology [id number]) is advised. The SARS-CoV-2 RNA is generally  detectable in upper and lower respiratory sp ecimens during the acute  phase of infection. The expected result is Negative. Fact Sheet for Patients:  StrictlyIdeas.no  Fact Sheet for Healthcare Providers: BankingDealers.co.za This test is not yet approved or cleared by the [place] FDA and has been authorized for detection and/or diagnosis of SARS-CoV-2 by FDA under an Emergency Use Authorization (EUA).  This EUA will remain in effect (meaning this test can be used) for the duration of the COVID-19 declaration under Section 564(b)(1) of the Act, 21 U.S.C. section 360bbb-3(b)(1), unless the authorization is terminated or revoked sooner. Performed at [facility], [address], [place], [place] [address]   Blood Culture (routine x 2)     Status: Abnormal   Collection Time: 06/13/18  3:00 PM  Result Value Ref Range Status   Specimen Description   Final    BLOOD LEFT HAND Performed at [facility], [address] [place], [place] [address]    Special Requests   Final    Blood Culture adequate volume BOTTLES DRAWN AEROBIC AND ANAEROBIC Performed at [place] [place], [place] [address]    Culture  Setup Time   Final    GRAM POSITIVE RODS AEROBIC BOTTLE ONLY Gram Stain Report Called to,Read Back By and Verified With: MURRELL,K AT 0715 BU HUFFINES,S ON 06/15/18. Performed at [facility], [address] [place], [place] [address]    Culture (A)  Final    DIPHTHEROIDS(CORYNEBACTERIUM SPECIES) Standardized susceptibility testing for this organism is not  available. Performed at [facility], [place] [address], [place], [place] [address]    Report Status 06/16/2018 FINAL  Final  MRSA PCR Screening     Status: None   Collection Time: 06/13/18 11:15 PM  Result Value Ref Range Status   MRSA by PCR NEGATIVE NEGATIVE Final    Comment:        The GeneXpert MRSA Assay (FDA approved for NASAL specimens only), is one component of a comprehensive MRSA colonization surveillance program. It is not intended to diagnose MRSA infection nor to guide or monitor treatment for MRSA infections. Performed at [facility], [place] [address] [place], [place] [address]   Culture, body fluid-bottle     Status: None (Preliminary result)   Collection Time: 06/15/18 10:34 AM  Result Value Ref Range Status   Specimen Description FLUID PLEURAL LEFT  Final   Special Requests NONE  Final   Culture   Final    NO GROWTH 2 DAYS Performed at [facility], [place] [address], [place], [place] [address]    Report Status PENDING  Incomplete  Gram stain     Status: None   Collection Time: 06/15/18 10:34 AM  Result Value Ref Range Status   Specimen Description FLUID PLEURAL LEFT  Final   Special Requests NONE  Final   Gram Stain   Final    WBC PRESENT, PREDOMINANTLY MONONUCLEAR NO ORGANISMS SEEN CYTOSPIN SMEAR Performed at [facility], [place] [address] [place],  [address]    Report Status 06/15/2018 FINAL  Final     Labs: BNP (last 3 results) Recent Labs    06/13/18 1402  BNP 58.0   Basic Metabolic Panel: Recent Labs  Lab 06/13/18 1402 06/14/18 0155 06/15/18 0305 06/16/18 0313 06/17/18 0329  NA 136 136 136 137 137  K 4.6 3.5 3.7 3.9 3.7  CL 105 105 104 101 102  CO2 21* 24 25 27 27   GLUCOSE 109* 90 82 84 131*  BUN 26* 20 14 14 14   CREATININE 0.98 1.12 1.16 1.15 0.97  CALCIUM 8.3* 8.1* 8.2* 8.8* 8.8*  MG  --   --   --   --  1.7   Liver Function Tests: Recent Labs  Lab 06/13/18 1402  AST 30  ALT 7  ALKPHOS 47   BILITOT 1.2  PROT 8.0  ALBUMIN 2.5*   No results for input(s): LIPASE, AMYLASE in the last 168 hours. No results for input(s): AMMONIA in the last 168 hours. CBC: Recent Labs  Lab 06/13/18 1402 06/14/18 0155 06/15/18 0305 06/16/18 0313 06/17/18 0329  WBC 12.0* 13.7* 13.4* 14.1* 13.7*  NEUTROABS 6.8  --   --   --  7.5  HGB 13.4 12.3* 12.2* 13.0 12.7*  HCT 42.9 38.2* 38.2* 40.0 38.8*  MCV 91.3 90.5 88.8 88.1 88.2  PLT 537* 492* 492* 517* 513*   Cardiac Enzymes: Recent Labs  Lab 06/13/18 1402  TROPONINI <0.03   BNP: Invalid input(s): POCBNP CBG: No results for input(s): GLUCAP in the last 168 hours. D-Dimer No results for input(s): DDIMER in the last 72 hours. Hgb A1c No results for input(s): HGBA1C in the last 72 hours. Lipid Profile No results for input(s): CHOL, HDL, LDLCALC, TRIG, CHOLHDL, LDLDIRECT in the last 72 hours. Thyroid function studies No results for input(s): TSH, T4TOTAL, T3FREE, THYROIDAB in the last 72 hours.  Invalid input(s): FREET3 Anemia work up No results for input(s): VITAMINB12, FOLATE, FERRITIN, TIBC, IRON, RETICCTPCT in the last 72 hours. Urinalysis    Component Value Date/Time   COLORURINE AMBER (A) 06/13/2018 1431   APPEARANCEUR CLEAR 06/13/2018 1431   LABSPEC 1.025 06/13/2018 1431   PHURINE 5.0 06/13/2018 1431   GLUCOSEU NEGATIVE 06/13/2018 1431   HGBUR NEGATIVE 06/13/2018 1431   BILIRUBINUR NEGATIVE 06/13/2018 1431   KETONESUR NEGATIVE 06/13/2018 1431   PROTEINUR NEGATIVE 06/13/2018 1431   NITRITE NEGATIVE 06/13/2018 1431   LEUKOCYTESUR NEGATIVE 06/13/2018 1431   Sepsis Labs Invalid input(s): PROCALCITONIN,  WBC,  LACTICIDVEN Microbiology Recent Results (from the past 240 hour(s))  Blood Culture (routine x 2)     Status: None   Collection Time: 06/13/18  2:59 PM  Result Value Ref Range Status   Specimen Description BLOOD RIGHT HAND  Final   Special Requests   Final    BOTTLES DRAWN AEROBIC AND ANAEROBIC Blood Culture  adequate volume   Culture   Final    NO GROWTH 5 DAYS Performed at [facility], [address], [place], [place] [address]    Report Status 06/18/2018 FINAL  Final  SARS Coronavirus 2 (CEPHEID- Performed in [place] hospital lab), Hosp Order     Status: None   Collection Time: 06/13/18  3:00 PM  Result Value Ref Range Status   SARS Coronavirus 2 NEGATIVE NEGATIVE Final    Comment: (NOTE) If result is NEGATIVE SARS-CoV-2 target nucleic acids are NOT DETECTED. The SARS-CoV-2 RNA is generally detectable in upper and lower  respiratory specimens during the acute phase of infection. The lowest  concentration of SARS-CoV-2 viral copies this assay can detect is 250  copies / mL. A negative result does not preclude SARS-CoV-2 infection  and should not be used as the sole basis for treatment or other  patient management decisions.  A negative result may occur with  improper specimen collection / handling, submission of specimen other  than nasopharyngeal swab, presence of viral mutation(s) within the  areas targeted by this assay, and inadequate number of viral copies  (<250 copies / mL). A negative result must be combined with clinical  observations, patient history, and epidemiological information. If result  is POSITIVE SARS-CoV-2 target nucleic acids are DETECTED. The SARS-CoV-2 RNA is generally detectable in upper and lower  respiratory specimens dur ing the acute phase of infection.  Positive  results are indicative of active infection with SARS-CoV-2.  Clinical  correlation with patient history and other diagnostic information is  necessary to determine patient infection status.  Positive results do  not rule out bacterial infection or co-infection with other viruses. If result is PRESUMPTIVE POSTIVE SARS-CoV-2 nucleic acids MAY BE PRESENT.   A presumptive positive result was obtained on the submitted specimen  and confirmed on repeat testing.  While 2019 novel coronavirus   (SARS-CoV-2) nucleic acids may be present in the submitted sample  additional confirmatory testing may be necessary for epidemiological  and / or clinical management purposes  to differentiate between  SARS-CoV-2 and other Sarbecovirus currently known to infect humans.  If clinically indicated additional testing with an alternate test  methodology [phone]) is advised. The SARS-CoV-2 RNA is generally  detectable in upper and lower respiratory sp ecimens during the acute  phase of infection. The expected result is Negative. Fact Sheet for Patients:  StrictlyIdeas.no Fact Sheet for Healthcare Providers: BankingDealers.co.za This test is not yet approved or cleared by the [place] FDA and has been authorized for detection and/or diagnosis of SARS-CoV-2 by FDA under an Emergency Use Authorization (EUA).  This EUA will remain in effect (meaning this test can be used) for the duration of the COVID-19 declaration under Section 564(b)(1) of the Act, 21 U.S.C. section 360bbb-3(b)(1), unless the authorization is terminated or revoked sooner. Performed at [facility], [address] [place], [place] [address]   Blood Culture (routine x 2)     Status: Abnormal   Collection Time: 06/13/18  3:00 PM  Result Value Ref Range Status   Specimen Description   Final    BLOOD LEFT HAND Performed at [facility], [address], [place], [place] [address]    Special Requests   Final    Blood Culture adequate volume BOTTLES DRAWN AEROBIC AND ANAEROBIC Performed at [place] [place], [place] [address]    Culture  Setup Time   Final    GRAM POSITIVE RODS AEROBIC BOTTLE ONLY Gram Stain Report Called to,Read Back By and Verified With: MURRELL,K AT 0715 BU HUFFINES,S ON 06/15/18. Performed at [facility], [address] [place], [place] [address]    Culture (A)  Final    DIPHTHEROIDS(CORYNEBACTERIUM SPECIES) Standardized  susceptibility testing for this organism is not available. Performed at [facility], [place] [address], [place], [place] [address]    Report Status 06/16/2018 FINAL  Final  MRSA PCR Screening     Status: None   Collection Time: 06/13/18 11:15 PM  Result Value Ref Range Status   MRSA by PCR NEGATIVE NEGATIVE Final    Comment:        The GeneXpert MRSA Assay (FDA approved for NASAL specimens only), is one component of a comprehensive MRSA colonization surveillance program. It is not intended to diagnose MRSA infection nor to guide or monitor treatment for MRSA infections. Performed at [facility], [place] [address], [place],  [address]   Culture, body fluid-bottle     Status: None (Preliminary result)   Collection Time: 06/15/18 10:34 AM  Result Value Ref Range Status   Specimen Description FLUID PLEURAL LEFT  Final   Special Requests NONE  Final   Culture   Final    NO GROWTH  2 DAYS Performed at [facility], [place] [address], [place], [place] [address]    Report Status PENDING  Incomplete  Gram stain     Status: None   Collection Time: 06/15/18 10:34 AM  Result Value Ref Range Status   Specimen Description FLUID PLEURAL LEFT  Final   Special Requests NONE  Final   Gram Stain   Final    WBC PRESENT, PREDOMINANTLY MONONUCLEAR NO ORGANISMS SEEN CYTOSPIN SMEAR Performed at [facility], [place] [address], [place], [place] [address]    Report Status 06/15/2018 FINAL  Final     Time coordinating discharge: 35 minutes  SIGNED:   Aline August, MD  [facility] 06/18/2018, 10:17 AM

## 2018-06-20 DIAGNOSIS — Z791 Long term (current) use of non-steroidal anti-inflammatories (NSAID): Secondary | ICD-10-CM | POA: Diagnosis not present

## 2018-06-20 DIAGNOSIS — C787 Secondary malignant neoplasm of liver and intrahepatic bile duct: Secondary | ICD-10-CM | POA: Diagnosis not present

## 2018-06-20 DIAGNOSIS — J440 Chronic obstructive pulmonary disease with acute lower respiratory infection: Secondary | ICD-10-CM | POA: Diagnosis not present

## 2018-06-20 DIAGNOSIS — C3490 Malignant neoplasm of unspecified part of unspecified bronchus or lung: Secondary | ICD-10-CM | POA: Diagnosis not present

## 2018-06-20 DIAGNOSIS — Z96641 Presence of right artificial hip joint: Secondary | ICD-10-CM | POA: Diagnosis not present

## 2018-06-20 DIAGNOSIS — G709 Myoneural disorder, unspecified: Secondary | ICD-10-CM | POA: Diagnosis not present

## 2018-06-20 DIAGNOSIS — J189 Pneumonia, unspecified organism: Secondary | ICD-10-CM | POA: Diagnosis not present

## 2018-06-20 DIAGNOSIS — J90 Pleural effusion, not elsewhere classified: Secondary | ICD-10-CM | POA: Diagnosis not present

## 2018-06-20 DIAGNOSIS — G8929 Other chronic pain: Secondary | ICD-10-CM | POA: Diagnosis not present

## 2018-06-20 DIAGNOSIS — Z7951 Long term (current) use of inhaled steroids: Secondary | ICD-10-CM | POA: Diagnosis not present

## 2018-06-20 DIAGNOSIS — Z96652 Presence of left artificial knee joint: Secondary | ICD-10-CM | POA: Diagnosis not present

## 2018-06-20 DIAGNOSIS — I10 Essential (primary) hypertension: Secondary | ICD-10-CM | POA: Diagnosis not present

## 2018-06-20 DIAGNOSIS — Z7982 Long term (current) use of aspirin: Secondary | ICD-10-CM | POA: Diagnosis not present

## 2018-06-20 DIAGNOSIS — M545 Low back pain: Secondary | ICD-10-CM | POA: Diagnosis not present

## 2018-06-20 DIAGNOSIS — I2510 Atherosclerotic heart disease of native coronary artery without angina pectoris: Secondary | ICD-10-CM | POA: Diagnosis not present

## 2018-06-20 LAB — CULTURE, BODY FLUID W GRAM STAIN -BOTTLE: Culture: NO GROWTH

## 2018-06-22 ENCOUNTER — Inpatient Hospital Stay (HOSPITAL_BASED_OUTPATIENT_CLINIC_OR_DEPARTMENT_OTHER): Payer: Medicare Other | Admitting: Hematology

## 2018-06-22 ENCOUNTER — Encounter (HOSPITAL_COMMUNITY): Payer: Self-pay | Admitting: Hematology

## 2018-06-22 ENCOUNTER — Ambulatory Visit (HOSPITAL_COMMUNITY)
Admission: RE | Admit: 2018-06-22 | Discharge: 2018-06-22 | Disposition: A | Discharge status: Home / Self Care | Payer: Medicare Other | Source: Ambulatory Visit | Attending: Hematology | Admitting: Hematology

## 2018-06-22 ENCOUNTER — Ambulatory Visit (HOSPITAL_COMMUNITY): Payer: Medicare Other

## 2018-06-22 ENCOUNTER — Other Ambulatory Visit: Payer: Self-pay

## 2018-06-22 ENCOUNTER — Other Ambulatory Visit (HOSPITAL_COMMUNITY): Payer: Medicare Other

## 2018-06-22 VITALS — BP 137/71 | HR 81 | Temp 97.8°F | Resp 18 | Wt 153.7 lb

## 2018-06-22 DIAGNOSIS — R05 Cough: Secondary | ICD-10-CM | POA: Diagnosis not present

## 2018-06-22 DIAGNOSIS — R079 Chest pain, unspecified: Secondary | ICD-10-CM | POA: Diagnosis not present

## 2018-06-22 DIAGNOSIS — C3432 Malignant neoplasm of lower lobe, left bronchus or lung: Secondary | ICD-10-CM

## 2018-06-22 DIAGNOSIS — Z79899 Other long term (current) drug therapy: Secondary | ICD-10-CM

## 2018-06-22 DIAGNOSIS — C3492 Malignant neoplasm of unspecified part of left bronchus or lung: Secondary | ICD-10-CM

## 2018-06-22 DIAGNOSIS — F17210 Nicotine dependence, cigarettes, uncomplicated: Secondary | ICD-10-CM | POA: Diagnosis not present

## 2018-06-22 DIAGNOSIS — R0602 Shortness of breath: Secondary | ICD-10-CM | POA: Diagnosis not present

## 2018-06-22 DIAGNOSIS — C229 Malignant neoplasm of liver, not specified as primary or secondary: Secondary | ICD-10-CM | POA: Diagnosis not present

## 2018-06-22 DIAGNOSIS — R0789 Other chest pain: Secondary | ICD-10-CM

## 2018-06-22 DIAGNOSIS — J90 Pleural effusion, not elsewhere classified: Secondary | ICD-10-CM | POA: Diagnosis not present

## 2018-06-22 NOTE — Assessment & Plan Note (Addendum)
1.  Non-small cell lung cancer, adenocarcinoma type: -he was evaluated by me as inpatient when he was hospitalized on 12/29/2017. - Left lower lobe endobronchial biopsy on 12/15/2017 shows non-small cell lung cancer, TTF-1 positive consistent with pulmonary adenocarcinoma. -MRI of the brain on 12/29/2017 did not show any evidence of intracranial metastasis.. -PET/CT scan on 02/16/2018 shows dominant perihilar left lung mass with mildly increased in size.  There is new hypermetabolic nodule in the left upper lobe and new hypermetabolic lesion in the dome of the liver.  Right upper lobe lung nodules have slightly decreased in size.  Similar appearance of hypermetabolic mediastinal and hilar adenopathy.  - Rebiopsy of the liver lesion on 03/17/2018 for molecular studies shows poorly differentiated carcinoma. - Foundation 1 testing shows MS-stable, TMB 20 Muts/Mb, BRAF amplification, MET amplification, MYC and KIT amplification, PDGFRA/SOX2 amplifications.  PD-L1 could not be done because of scant tissue. -Combination immunotherapy with ipilimumab and nivolumab cycle 1 on 04/26/2018, cycle 2 on 06/08/2018. - He was hospitalized from 06/13/2018 through 06/18/2018 with left-sided chest pain and difficulty breathing. -A CT chest PE protocol on 06/13/2018 was negative for PE.  Loculated left pleural effusion with extensive airspace disease on the left likely pneumonia.  Right hilar adenopathy noted.  Anterior mediastinal mass is new and new liver lesions were seen. - On 06/15/2018, he underwent left thoracentesis with up to 1.5 L of golden fluid removed.  Cytologically no malignant cells. -Patient reported improvement in the left-sided chest wall pain after the fluid was removed.  His breathing also improved. - He reported that shortness of breath on exertion is worse today.  However he was not wearing oxygen today.  We will try to get him a portable oxygen concentrator as he is not able to carry the large oxygen tank. -  I have done a chest x-ray in the office and reviewed it.  This shows similar appearance to the previous study with no worsening or new findings. -I had a prolonged discussion with the patient about the CT scan findings which appear to show progression compared to the PET scan from February 15, 2018.  However patient did not start treatments until 04/26/2018.  Unfortunately we did not have a baseline scan just prior to initiation of therapy.  Hence I have recommended continuing 2 more months of immunotherapy with repeat scans and see if it is helping.  He was also given the option of best supportive care in the form of hospice.  Patient is opting for continuation of therapy. -I will reschedule his treatment to next week.  I will also call and talk to patient's daughter and update his situation.  2.  Left chest wall pain: -He has sharp pain in the left lateral lower chest wall. -He is taking Percocet 10 mg every 6 hours.

## 2018-06-22 NOTE — Progress Notes (Signed)
[place], [place] [address]   CLINIC:  Medical Oncology/Hematology  PCP:  Jani Gravel, MD [place] [address] [phone]   REASON FOR VISIT:  Follow-up for  Non-small cell lung cancer   BRIEF ONCOLOGIC HISTORY:    Non-small cell carcinoma of left lung ([place])   12/29/2017 Initial Diagnosis    Non-small cell lung cancer ([place])    04/26/2018 -  Chemotherapy    The patient had ipilimumab (YERVOY) 75 mg in sodium chloride 0.9 % 50 mL chemo infusion, 1 mg/kg = 75 mg (100 % of original dose 1 mg/kg), Intravenous,  Once, 2 of 3 cycles Dose modification: 1 mg/kg (original dose 1 mg/kg, Cycle 1, Reason: Other (see comments), Comment: per trial) Administration: 75 mg (04/26/2018), 75 mg (06/08/2018) nivolumab (OPDIVO) 220 mg in sodium chloride 0.9 % 100 mL chemo infusion, 3 mg/kg = 220 mg (100 % of original dose 3 mg/kg), Intravenous, Once, 4 of 9 cycles Dose modification: 3 mg/kg (original dose 3 mg/kg, Cycle 1, Reason: Other (see comments), Comment: Per trial) Administration: 220 mg (04/26/2018), 220 mg (06/08/2018), 220 mg (05/10/2018), 220 mg (05/24/2018)  for chemotherapy treatment.       CANCER STAGING: Cancer Staging No matching staging information was found for the patient.   INTERVAL HISTORY:  Bradley [name] 78 y.o. male returns for follow-up of lung cancer.  He was recently hospitalized from [phone] through 06/18/2018 at [facility].  He had about 1.5 L of fluid pulled off from the left chest wall.  Patient reported shortness of breath has improved after the fluid pulled out.  He also reported pain improvement in the left lateral chest wall.  Today he felt very short of breath while walking to our cancer center.  However he was not wearing oxygen, as he cannot carry the big oxygen tank.  He is eating is improving.  Appetite is 100%.  Energy levels are low.  Denies any nausea or vomiting or diarrhea.  Denies any skin  rashes or itching.    REVIEW OF SYSTEMS:  Review of Systems  Respiratory: Positive for cough and shortness of breath.   Cardiovascular: Positive for chest pain.  Neurological: Positive for numbness.  All other systems reviewed and are negative.    PAST MEDICAL/SURGICAL HISTORY:  Past Medical History:  Diagnosis Date   Cancer ([place])    lung   Chronic lower back pain    Chronic pain    pain management   Contact with stonefish as cause of accidental injury 1954   "stung across my left hand in [place]; LUE weaker since"   COPD (chronic obstructive pulmonary disease) ([place])    Coronary artery disease    Dyspnea    with exertion   High cholesterol    History of blood transfusion 1943   History of kidney stones    passed   Hypertension    Mass of left lung    Migraine    "none since the 1990s" (09/11/2016)   Myocardial infarction ([place]) 01/20/1993; 02/28/1993   Neuromuscular disorder ([place])    neuropathy left   Pneumonia    "several times" (09/11/2016)   Tobacco abuse    Past Surgical History:  Procedure Laterality Date   APPENDECTOMY     CARDIAC CATHETERIZATION     CATARACT EXTRACTION, BILATERAL Bilateral    COLONOSCOPY     HIP ARTHROPLASTY Right 12/20/2016   Procedure: ARTHROPLASTY BIPOLAR HIP (HEMIARTHROPLASTY);  Surgeon: [name], MD;  Location: [facility];  Service: Orthopedics;  Laterality: Right;   IR THORACENTESIS ASP PLEURAL SPACE W/IMG GUIDE  06/15/2018   JOINT REPLACEMENT     LEFT HEART CATH AND CORONARY ANGIOGRAPHY N/A 09/12/2016   Procedure: LEFT HEART CATH AND CORONARY ANGIOGRAPHY;  Surgeon: Burnell Blanks, MD;  Location: [facility];  Service: Cardiovascular;  Laterality: N/A;   NASAL ENDOSCOPY WITH EPISTAXIS CONTROL Right 03/15/2016   Procedure: NASAL ENDOSCOPY WITH EPISTAXIS CONTROL;  Surgeon: Jodi Marble, MD;  Location: [facility];  Service: ENT;  Laterality: Right;   PORTACATH PLACEMENT Right 04/23/2018   Procedure:  INSERTION PORT-A-CATH;  Surgeon: Aviva Signs, MD;  Location: [facility];  Service: General;  Laterality: Right;   SEPTOPLASTY Right 03/15/2016   Procedure: SEPTOPLASTY;  Surgeon: Jodi Marble, MD;  Location: [facility];  Service: ENT;  Laterality: Right;   SINUS EXPLORATION Right 03/15/2016   Procedure: SINUS EXPLORATION;  Surgeon: Jodi Marble, MD;  Location: [facility];  Service: ENT;  Laterality: Right;   TONSILLECTOMY     TOOTH EXTRACTION     TOTAL KNEE ARTHROPLASTY Left    VIDEO BRONCHOSCOPY WITH ENDOBRONCHIAL ULTRASOUND N/A 12/15/2017   Procedure: VIDEO BRONCHOSCOPY WITH ENDOBRONCHIAL ULTRASOUND;  Surgeon: Rigoberto Noel, MD;  Location: [facility];  Service: Thoracic;  Laterality: N/A;     SOCIAL HISTORY:  Social History   Socioeconomic History   Marital status: Widowed    Spouse name: Not on file   Number of children: Not on file   Years of education: Not on file   Highest education level: Not on file  Occupational History   Not on file  Social Needs   Financial resource strain: Not on file   Food insecurity:    Worry: Not on file    Inability: Not on file   Transportation needs:    Medical: Not on file    Non-medical: Not on file  Tobacco Use   Smoking status: Current Every Day Smoker    Packs/day: 1.00    Years: 70.00    Pack years: 70.00    Types: Cigarettes   Smokeless tobacco: Never Used   Tobacco comment: smokes 8 a day  Substance and Sexual Activity   Alcohol use: No    Comment: 11/26/2017  "drank from the age of 21 til 03/05/1993"   Drug use: Not Currently    Comment: 11/26/2017 "used some drugs when I was young"   Sexual activity: Not Currently  Lifestyle   Physical activity:    Days per week: Not on file    Minutes per session: Not on file   Stress: Not on file  Relationships   Social connections:    Talks on phone: Not on file    Gets together: Not on file    Attends religious service: Not on file    Active member of club or organization:  Not on file    Attends meetings of clubs or organizations: Not on file    Relationship status: Not on file   Intimate partner violence:    Fear of current or ex partner: Not on file    Emotionally abused: Not on file    Physically abused: Not on file    Forced sexual activity: Not on file  Other Topics Concern   Not on file  Social History Narrative   Not on file    FAMILY HISTORY:  Family History  Adopted: Yes  Problem Relation Age of Onset  Cancer Mother    Obesity Father     CURRENT MEDICATIONS:  Outpatient Encounter Medications as of 06/22/2018  Medication Sig Note   ADVAIR HFA 115-21 MCG/ACT inhaler Inhale 2 puffs into the lungs every 12 (twelve) hours.    amLODipine (NORVASC) 5 MG tablet TAKE 1 TABLET BY MOUTH EVERY DAY (Patient taking differently: Take 5 mg by mouth daily. )    amoxicillin-clavulanate (AUGMENTIN) 875-125 MG tablet Take 1 tablet by mouth 2 (two) times daily for 7 days.    Ascorbic Acid (VITAMIN C) 500 MG CAPS Take 500 mg by mouth daily.    aspirin EC 81 MG tablet Take 81 mg by mouth daily.     celecoxib (CELEBREX) 200 MG capsule Take 1 capsule (200 mg total) by mouth daily.    cyclobenzaprine (FLEXERIL) 5 MG tablet Take 5 mg by mouth 2 (two) times daily.     Ferrous Sulfate (IRON) 325 (65 Fe) MG TABS Take 1 tablet by mouth daily.    fluticasone (FLONASE) 50 MCG/ACT nasal spray Place 2 sprays into both nostrils daily.    gabapentin (NEURONTIN) 300 MG capsule Take 1 capsule by mouth 3 (three) times daily.    Ipilimumab (YERVOY IV) Inject into the vein every 6 (six) weeks.    ipratropium-albuterol (DUONEB) 0.5-2.5 (3) MG/3ML SOLN Take 3 mLs by nebulization 3 (three) times daily.    lidocaine-prilocaine (EMLA) cream Apply 1 application topically as needed. Apply a small amount to port site and cover with plastic wrap 1 hour prior to infusion appointments    lisinopril (ZESTRIL) 20 MG tablet Take 1 tablet (20 mg total) by mouth daily.     metoCLOPramide (REGLAN) 5 MG tablet Take 1 tablet by mouth 4 (four) times daily -  before meals and at bedtime.    metoprolol succinate (TOPROL-XL) 25 MG 24 hr tablet Take 25 mg by mouth daily.    mirabegron ER (MYRBETRIQ) 25 MG TB24 tablet Take 25 mg by mouth at bedtime.     montelukast (SINGULAIR) 10 MG tablet Take 1 tablet by mouth daily.    Multiple Vitamins-Minerals (MULTIVITAMIN WITH MINERALS) tablet Take 1 tablet by mouth daily.    nitroGLYCERIN (NITROSTAT) 0.4 MG SL tablet Place 0.4 mg under the tongue every 5 (five) minutes as needed for chest pain.    Nivolumab (OPDIVO IV) Inject into the vein every 14 (fourteen) days.    omeprazole (PRILOSEC) 40 MG capsule Take 40 mg by mouth daily.     oxyCODONE (OXY IR/ROXICODONE) 5 MG immediate release tablet Take 1 tablet (5 mg total) by mouth every 4 (four) hours as needed for severe pain. 05/24/2018: 58m per patient   polyethylene glycol (MIRALAX / GLYCOLAX) 17 g packet Take 17 g by mouth daily as needed for mild constipation.    potassium gluconate (HM POTASSIUM) 595 (99 K) MG TABS tablet Take 595 mg by mouth daily.    senna (SENOKOT) 8.6 MG TABS tablet Take 1 tablet (8.6 mg total) by mouth daily.    simvastatin (ZOCOR) 10 MG tablet Take 10 mg by mouth at bedtime.     VENTOLIN HFA 108 (90 Base) MCG/ACT inhaler Inhale 2 puffs into the lungs every 4 (four) hours as needed for wheezing or shortness of breath.     No facility-administered encounter medications on file as of 06/22/2018.     ALLERGIES:  Allergies  Allergen Reactions   Aspirin     Regular asa      PHYSICAL EXAM:  ECOG Performance status:  1  Vitals:   06/22/18 1204  BP: 137/71  Pulse: 81  Resp: 18  Temp: 97.8 F (36.6 C)  SpO2: 92%   Filed Weights   06/22/18 1204  Weight: 153 lb 11.2 oz (69.7 kg)    Physical Exam Vitals signs reviewed.  Constitutional:      Appearance: Normal appearance.  Cardiovascular:     Rate and Rhythm: Normal rate and  regular rhythm.     Heart sounds: Normal heart sounds.  Pulmonary:     Effort: Pulmonary effort is normal.     Breath sounds: Normal breath sounds.  Abdominal:     General: There is no distension.     Palpations: Abdomen is soft. There is no mass.  Musculoskeletal:        General: No swelling.  Skin:    General: Skin is warm.  Neurological:     General: No focal deficit present.     Mental Status: He is alert and oriented to person, place, and time.  Psychiatric:        Mood and Affect: Mood normal.        Behavior: Behavior normal.      LABORATORY DATA:  I have reviewed the labs as listed.  CBC    Component Value Date/Time   WBC 13.7 (H) 06/17/2018 0329   RBC 4.40 06/17/2018 0329   HGB 12.7 (L) 06/17/2018 0329   HCT 38.8 (L) 06/17/2018 0329   PLT 513 (H) 06/17/2018 0329   MCV 88.2 06/17/2018 0329   MCH 28.9 06/17/2018 0329   MCHC 32.7 06/17/2018 0329   RDW 16.4 (H) 06/17/2018 0329   LYMPHSABS 3.2 06/17/2018 0329   MONOABS 1.0 06/17/2018 0329   EOSABS 1.9 (H) 06/17/2018 0329   BASOSABS 0.1 06/17/2018 0329   CMP Latest Ref Rng & Units 06/17/2018 06/16/2018 06/15/2018  Glucose 70 - 99 mg/dL 131(H) 84 82  BUN 8 - 23 mg/dL _0 Creatinine 0.61 - 1.24 mg/dL 0.97 1.15 1.16  Sodium 135 - 145 mmol/L 137 137 136  Potassium 3.5 - 5.1 mmol/L 3.7 3.9 3.7  Chloride 98 - 111 mmol/L 102 101 104  CO2 22 - 32 mmol/L _1 Calcium 8.9 - 10.3 mg/dL 8.8(L) 8.8(L) 8.2(L)  Total Protein 6.5 - 8.1 g/dL - - -  Total Bilirubin 0.3 - 1.2 mg/dL - - -  Alkaline Phos 38 - 126 U/L - - -  AST 15 - 41 U/L - - -  ALT 0 - 44 U/L - - -       DIAGNOSTIC IMAGING:  I have independently reviewed the scans and discussed with the patient.     ASSESSMENT & PLAN:   Non-small cell carcinoma of left lung ([place]) 1.  Non-small cell lung cancer, adenocarcinoma type: -he was evaluated by me as inpatient when he was hospitalized on 12/29/2017. - Left lower lobe endobronchial biopsy on  12/15/2017 shows non-small cell lung cancer, TTF-1 positive consistent with pulmonary adenocarcinoma. -MRI of the brain on 12/29/2017 did not show any evidence of intracranial metastasis.. -PET/CT scan on 02/16/2018 shows dominant perihilar left lung mass with mildly increased in size.  There is new hypermetabolic nodule in the left upper lobe and new hypermetabolic lesion in the dome of the liver.  Right upper lobe lung nodules have slightly decreased in size.  Similar appearance of hypermetabolic mediastinal and hilar adenopathy.  - Rebiopsy of the liver lesion on 03/17/2018 for molecular studies shows poorly differentiated carcinoma. -  Foundation 1 testing shows MS-stable, TMB 20 Muts/Mb, BRAF amplification, MET amplification, MYC and KIT amplification, PDGFRA/SOX2 amplifications.  PD-L1 could not be done because of scant tissue. -Combination immunotherapy with ipilimumab and nivolumab cycle 1 on 04/26/2018, cycle 2 on 06/08/2018. - He was hospitalized from 06/13/2018 through 06/18/2018 with left-sided chest pain and difficulty breathing. -A CT chest PE protocol on 06/13/2018 was negative for PE.  Loculated left pleural effusion with extensive airspace disease on the left likely pneumonia.  Right hilar adenopathy noted.  Anterior mediastinal mass is new and new liver lesions were seen. - On 06/15/2018, he underwent left thoracentesis with up to 1.5 L of golden fluid removed.  Cytologically no malignant cells. -Patient reported improvement in the left-sided chest wall pain after the fluid was removed.  His breathing also improved. - He reported that shortness of breath on exertion is worse today.  However he was not wearing oxygen today.  We will try to get him a portable oxygen concentrator as he is not able to carry the large oxygen tank. - I have done a chest x-ray in the office and reviewed it.  This shows similar appearance to the previous study with no worsening or new findings. -I had a prolonged  discussion with the patient about the CT scan findings which appear to show progression compared to the PET scan from February 15, 2018.  However patient did not start treatments until 04/26/2018.  Unfortunately we did not have a baseline scan just prior to initiation of therapy.  Hence I have recommended continuing 2 more months of immunotherapy with repeat scans and see if it is helping.  He was also given the option of best supportive care in the form of hospice.  Patient is opting for continuation of therapy. -I will reschedule his treatment to next week.  I will also call and talk to patient's daughter and update his situation.  2.  Left chest wall pain: -He has sharp pain in the left lateral lower chest wall. -He is taking Percocet 10 mg every 6 hours.    Total time spent is 40 minutes with more than 50% of the time spent face-to-face discussing treatment plan, pain management and coordination of care.  Orders placed this encounter:  Orders Placed This Encounter  Procedures   DG Chest [place], MD [place] [phone]

## 2018-06-23 DIAGNOSIS — I2510 Atherosclerotic heart disease of native coronary artery without angina pectoris: Secondary | ICD-10-CM | POA: Diagnosis not present

## 2018-06-23 DIAGNOSIS — I10 Essential (primary) hypertension: Secondary | ICD-10-CM | POA: Diagnosis not present

## 2018-06-23 DIAGNOSIS — Z7982 Long term (current) use of aspirin: Secondary | ICD-10-CM | POA: Diagnosis not present

## 2018-06-23 DIAGNOSIS — G8929 Other chronic pain: Secondary | ICD-10-CM | POA: Diagnosis not present

## 2018-06-23 DIAGNOSIS — J189 Pneumonia, unspecified organism: Secondary | ICD-10-CM | POA: Diagnosis not present

## 2018-06-23 DIAGNOSIS — Z7951 Long term (current) use of inhaled steroids: Secondary | ICD-10-CM | POA: Diagnosis not present

## 2018-06-23 DIAGNOSIS — C787 Secondary malignant neoplasm of liver and intrahepatic bile duct: Secondary | ICD-10-CM | POA: Diagnosis not present

## 2018-06-23 DIAGNOSIS — Z96641 Presence of right artificial hip joint: Secondary | ICD-10-CM | POA: Diagnosis not present

## 2018-06-23 DIAGNOSIS — G709 Myoneural disorder, unspecified: Secondary | ICD-10-CM | POA: Diagnosis not present

## 2018-06-23 DIAGNOSIS — J90 Pleural effusion, not elsewhere classified: Secondary | ICD-10-CM | POA: Diagnosis not present

## 2018-06-23 DIAGNOSIS — J440 Chronic obstructive pulmonary disease with acute lower respiratory infection: Secondary | ICD-10-CM | POA: Diagnosis not present

## 2018-06-23 DIAGNOSIS — M545 Low back pain: Secondary | ICD-10-CM | POA: Diagnosis not present

## 2018-06-23 DIAGNOSIS — C3490 Malignant neoplasm of unspecified part of unspecified bronchus or lung: Secondary | ICD-10-CM | POA: Diagnosis not present

## 2018-06-23 DIAGNOSIS — Z791 Long term (current) use of non-steroidal anti-inflammatories (NSAID): Secondary | ICD-10-CM | POA: Diagnosis not present

## 2018-06-23 DIAGNOSIS — Z96652 Presence of left artificial knee joint: Secondary | ICD-10-CM | POA: Diagnosis not present

## 2018-06-24 DIAGNOSIS — Z79899 Other long term (current) drug therapy: Secondary | ICD-10-CM | POA: Diagnosis not present

## 2018-06-24 DIAGNOSIS — J189 Pneumonia, unspecified organism: Secondary | ICD-10-CM | POA: Diagnosis not present

## 2018-06-24 DIAGNOSIS — C3490 Malignant neoplasm of unspecified part of unspecified bronchus or lung: Secondary | ICD-10-CM | POA: Diagnosis not present

## 2018-06-24 DIAGNOSIS — Z9981 Dependence on supplemental oxygen: Secondary | ICD-10-CM | POA: Diagnosis not present

## 2018-06-24 DIAGNOSIS — I10 Essential (primary) hypertension: Secondary | ICD-10-CM | POA: Diagnosis not present

## 2018-06-25 ENCOUNTER — Other Ambulatory Visit: Payer: Self-pay

## 2018-06-25 ENCOUNTER — Inpatient Hospital Stay (HOSPITAL_COMMUNITY)
Admission: EM | Admit: 2018-06-25 | Discharge: 2018-06-30 | DRG: 186 | Disposition: A | Discharge status: Home Health | Payer: Medicare Other | Attending: Family Medicine | Admitting: Family Medicine

## 2018-06-25 ENCOUNTER — Emergency Department (HOSPITAL_COMMUNITY): Payer: Medicare Other

## 2018-06-25 ENCOUNTER — Encounter (HOSPITAL_COMMUNITY): Payer: Self-pay | Admitting: *Deleted

## 2018-06-25 ENCOUNTER — Ambulatory Visit (HOSPITAL_COMMUNITY): Payer: Medicare Other | Admitting: Hematology

## 2018-06-25 DIAGNOSIS — Z8701 Personal history of pneumonia (recurrent): Secondary | ICD-10-CM

## 2018-06-25 DIAGNOSIS — Z20828 Contact with and (suspected) exposure to other viral communicable diseases: Secondary | ICD-10-CM | POA: Diagnosis present

## 2018-06-25 DIAGNOSIS — J9621 Acute and chronic respiratory failure with hypoxia: Secondary | ICD-10-CM | POA: Diagnosis not present

## 2018-06-25 DIAGNOSIS — E7800 Pure hypercholesterolemia, unspecified: Secondary | ICD-10-CM | POA: Diagnosis present

## 2018-06-25 DIAGNOSIS — Z9981 Dependence on supplemental oxygen: Secondary | ICD-10-CM | POA: Diagnosis not present

## 2018-06-25 DIAGNOSIS — Z66 Do not resuscitate: Secondary | ICD-10-CM | POA: Diagnosis present

## 2018-06-25 DIAGNOSIS — I2510 Atherosclerotic heart disease of native coronary artery without angina pectoris: Secondary | ICD-10-CM | POA: Diagnosis present

## 2018-06-25 DIAGNOSIS — C787 Secondary malignant neoplasm of liver and intrahepatic bile duct: Secondary | ICD-10-CM | POA: Diagnosis present

## 2018-06-25 DIAGNOSIS — C781 Secondary malignant neoplasm of mediastinum: Secondary | ICD-10-CM | POA: Diagnosis present

## 2018-06-25 DIAGNOSIS — E785 Hyperlipidemia, unspecified: Secondary | ICD-10-CM | POA: Diagnosis not present

## 2018-06-25 DIAGNOSIS — M545 Low back pain: Secondary | ICD-10-CM | POA: Diagnosis present

## 2018-06-25 DIAGNOSIS — J439 Emphysema, unspecified: Secondary | ICD-10-CM | POA: Diagnosis not present

## 2018-06-25 DIAGNOSIS — R9389 Abnormal findings on diagnostic imaging of other specified body structures: Secondary | ICD-10-CM | POA: Diagnosis not present

## 2018-06-25 DIAGNOSIS — J441 Chronic obstructive pulmonary disease with (acute) exacerbation: Secondary | ICD-10-CM | POA: Diagnosis not present

## 2018-06-25 DIAGNOSIS — G894 Chronic pain syndrome: Secondary | ICD-10-CM | POA: Diagnosis present

## 2018-06-25 DIAGNOSIS — Z7951 Long term (current) use of inhaled steroids: Secondary | ICD-10-CM

## 2018-06-25 DIAGNOSIS — F05 Delirium due to known physiological condition: Secondary | ICD-10-CM | POA: Diagnosis not present

## 2018-06-25 DIAGNOSIS — C3492 Malignant neoplasm of unspecified part of left bronchus or lung: Secondary | ICD-10-CM | POA: Diagnosis not present

## 2018-06-25 DIAGNOSIS — R0902 Hypoxemia: Secondary | ICD-10-CM | POA: Diagnosis not present

## 2018-06-25 DIAGNOSIS — Z886 Allergy status to analgesic agent status: Secondary | ICD-10-CM

## 2018-06-25 DIAGNOSIS — J90 Pleural effusion, not elsewhere classified: Secondary | ICD-10-CM | POA: Diagnosis not present

## 2018-06-25 DIAGNOSIS — J9620 Acute and chronic respiratory failure, unspecified whether with hypoxia or hypercapnia: Secondary | ICD-10-CM | POA: Diagnosis present

## 2018-06-25 DIAGNOSIS — J849 Interstitial pulmonary disease, unspecified: Secondary | ICD-10-CM | POA: Diagnosis not present

## 2018-06-25 DIAGNOSIS — Z7982 Long term (current) use of aspirin: Secondary | ICD-10-CM

## 2018-06-25 DIAGNOSIS — G629 Polyneuropathy, unspecified: Secondary | ICD-10-CM | POA: Diagnosis present

## 2018-06-25 DIAGNOSIS — J449 Chronic obstructive pulmonary disease, unspecified: Secondary | ICD-10-CM | POA: Diagnosis present

## 2018-06-25 DIAGNOSIS — E876 Hypokalemia: Secondary | ICD-10-CM

## 2018-06-25 DIAGNOSIS — I252 Old myocardial infarction: Secondary | ICD-10-CM

## 2018-06-25 DIAGNOSIS — F17210 Nicotine dependence, cigarettes, uncomplicated: Secondary | ICD-10-CM | POA: Diagnosis present

## 2018-06-25 DIAGNOSIS — D72829 Elevated white blood cell count, unspecified: Secondary | ICD-10-CM

## 2018-06-25 DIAGNOSIS — Z79899 Other long term (current) drug therapy: Secondary | ICD-10-CM

## 2018-06-25 DIAGNOSIS — Z720 Tobacco use: Secondary | ICD-10-CM

## 2018-06-25 DIAGNOSIS — Z09 Encounter for follow-up examination after completed treatment for conditions other than malignant neoplasm: Secondary | ICD-10-CM

## 2018-06-25 DIAGNOSIS — R0602 Shortness of breath: Secondary | ICD-10-CM

## 2018-06-25 DIAGNOSIS — F17209 Nicotine dependence, unspecified, with unspecified nicotine-induced disorders: Secondary | ICD-10-CM

## 2018-06-25 DIAGNOSIS — J95811 Postprocedural pneumothorax: Secondary | ICD-10-CM | POA: Diagnosis not present

## 2018-06-25 DIAGNOSIS — Z791 Long term (current) use of non-steroidal anti-inflammatories (NSAID): Secondary | ICD-10-CM

## 2018-06-25 DIAGNOSIS — Z9689 Presence of other specified functional implants: Secondary | ICD-10-CM

## 2018-06-25 DIAGNOSIS — J690 Pneumonitis due to inhalation of food and vomit: Secondary | ICD-10-CM | POA: Diagnosis not present

## 2018-06-25 DIAGNOSIS — C3432 Malignant neoplasm of lower lobe, left bronchus or lung: Secondary | ICD-10-CM | POA: Diagnosis present

## 2018-06-25 DIAGNOSIS — J189 Pneumonia, unspecified organism: Secondary | ICD-10-CM | POA: Diagnosis present

## 2018-06-25 DIAGNOSIS — I10 Essential (primary) hypertension: Secondary | ICD-10-CM | POA: Diagnosis not present

## 2018-06-25 DIAGNOSIS — R846 Abnormal cytological findings in specimens from respiratory organs and thorax: Secondary | ICD-10-CM | POA: Diagnosis not present

## 2018-06-25 DIAGNOSIS — Z79891 Long term (current) use of opiate analgesic: Secondary | ICD-10-CM

## 2018-06-25 DIAGNOSIS — Z4682 Encounter for fitting and adjustment of non-vascular catheter: Secondary | ICD-10-CM | POA: Diagnosis not present

## 2018-06-25 HISTORY — DX: Dependence on supplemental oxygen: Z99.81

## 2018-06-25 LAB — BASIC METABOLIC PANEL
Anion gap: 8 (ref 5–15)
BUN: 13 mg/dL (ref 8–23)
CO2: 18 mmol/L — ABNORMAL LOW (ref 22–32)
Calcium: 6.7 mg/dL — ABNORMAL LOW (ref 8.9–10.3)
Chloride: 114 mmol/L — ABNORMAL HIGH (ref 98–111)
Creatinine, Ser: 0.7 mg/dL (ref 0.61–1.24)
GFR calc Af Amer: >60 mL/min (ref >60)
GFR calc non Af Amer: >60 mL/min (ref >60)
Glucose, Bld: 85 mg/dL (ref 70–99)
Potassium: 2.9 mmol/L — ABNORMAL LOW (ref 3.5–5.1)
Sodium: 140 mmol/L (ref 135–145)

## 2018-06-25 LAB — CREATININE, SERUM
Creatinine, Ser: 1.19 mg/dL (ref 0.61–1.24)
GFR calc Af Amer: >60 mL/min (ref >60)
GFR calc non Af Amer: 59 mL/min — ABNORMAL LOW (ref >60)

## 2018-06-25 LAB — CBC WITH DIFFERENTIAL/PLATELET
Abs Immature Granulocytes: 0.05 10*3/uL (ref 0.00–0.07)
Basophils Absolute: 0.1 10*3/uL (ref 0.0–0.1)
Basophils Relative: 1 %
Eosinophils Absolute: 0.5 10*3/uL (ref 0.0–0.5)
Eosinophils Relative: 4 %
HCT: 48.4 % (ref 39.0–52.0)
Hemoglobin: 15.5 g/dL (ref 13.0–17.0)
Immature Granulocytes: 0 %
Lymphocytes Relative: 26 %
Lymphs Abs: 3.1 10*3/uL (ref 0.7–4.0)
MCH: 28.4 pg (ref 26.0–34.0)
MCHC: 32 g/dL (ref 30.0–36.0)
MCV: 88.6 fL (ref 80.0–100.0)
Monocytes Absolute: 0.6 10*3/uL (ref 0.1–1.0)
Monocytes Relative: 5 %
Neutro Abs: 7.7 10*3/uL (ref 1.7–7.7)
Neutrophils Relative %: 64 %
Platelets: 580 10*3/uL — ABNORMAL HIGH (ref 150–400)
RBC: 5.46 MIL/uL (ref 4.22–5.81)
RDW: 18.6 % — ABNORMAL HIGH (ref 11.5–15.5)
WBC: 12 10*3/uL — ABNORMAL HIGH (ref 4.0–10.5)
nRBC: 0 % (ref 0.0–0.2)

## 2018-06-25 LAB — CBC
HCT: 45.1 % (ref 39.0–52.0)
Hemoglobin: 14.6 g/dL (ref 13.0–17.0)
MCH: 28.7 pg (ref 26.0–34.0)
MCHC: 32.4 g/dL (ref 30.0–36.0)
MCV: 88.6 fL (ref 80.0–100.0)
Platelets: 575 10*3/uL — ABNORMAL HIGH (ref 150–400)
RBC: 5.09 MIL/uL (ref 4.22–5.81)
RDW: 17.2 % — ABNORMAL HIGH (ref 11.5–15.5)
WBC: 11.6 10*3/uL — ABNORMAL HIGH (ref 4.0–10.5)
nRBC: 0 % (ref 0.0–0.2)

## 2018-06-25 LAB — SARS CORONAVIRUS 2 BY RT PCR (HOSPITAL ORDER, PERFORMED IN ~~LOC~~ HOSPITAL LAB): SARS Coronavirus 2: NEGATIVE

## 2018-06-25 LAB — URINALYSIS, ROUTINE W REFLEX MICROSCOPIC
Bilirubin Urine: NEGATIVE
Glucose, UA: NEGATIVE mg/dL
Hgb urine dipstick: NEGATIVE
Leukocytes,Ua: NEGATIVE
Nitrite: NEGATIVE
Protein, ur: NEGATIVE mg/dL
Specific Gravity, Urine: >1.03 — ABNORMAL HIGH (ref 1.005–1.030)
pH: 5.5 (ref 5.0–8.0)

## 2018-06-25 LAB — BRAIN NATRIURETIC PEPTIDE: B Natriuretic Peptide: 52 pg/mL (ref 0.0–100.0)

## 2018-06-25 LAB — ALBUMIN: Albumin: 2.3 g/dL — ABNORMAL LOW (ref 3.5–5.0)

## 2018-06-25 LAB — LACTIC ACID, PLASMA
Lactic Acid, Venous: 1 mmol/L (ref 0.5–1.9)
Lactic Acid, Venous: 1.6 mmol/L (ref 0.5–1.9)

## 2018-06-25 LAB — TROPONIN I: Troponin I: <0.03 ng/mL (ref <0.03)

## 2018-06-25 MED ORDER — FERROUS SULFATE 325 (65 FE) MG PO TABS
325.0000 mg | ORAL_TABLET | Freq: Every day | ORAL | Status: DC
  Administered 2018-06-26 – 2018-06-30 (×5): 325 mg via ORAL
  Filled 2018-06-25 (×5): qty 1

## 2018-06-25 MED ORDER — METOCLOPRAMIDE HCL 5 MG PO TABS
5.0000 mg | ORAL_TABLET | Freq: Three times a day (TID) | ORAL | Status: DC
  Administered 2018-06-25 – 2018-06-30 (×20): 5 mg via ORAL
  Filled 2018-06-25 (×20): qty 1

## 2018-06-25 MED ORDER — ALBUTEROL SULFATE (2.5 MG/3ML) 0.083% IN NEBU: 3.0000 mL | INHALATION_SOLUTION | RESPIRATORY_TRACT | Status: DC | PRN

## 2018-06-25 MED ORDER — SODIUM CHLORIDE 0.9% FLUSH: 3.0000 mL | INTRAVENOUS | Status: DC | PRN

## 2018-06-25 MED ORDER — ALBUTEROL SULFATE HFA 108 (90 BASE) MCG/ACT IN AERS
8.0000 | INHALATION_SPRAY | RESPIRATORY_TRACT | Status: AC
  Administered 2018-06-25: 8 via RESPIRATORY_TRACT
  Filled 2018-06-25: qty 6.7

## 2018-06-25 MED ORDER — SODIUM CHLORIDE 0.9% FLUSH
3.0000 mL | Freq: Two times a day (BID) | INTRAVENOUS | Status: DC
  Administered 2018-06-25 – 2018-06-30 (×7): 3 mL via INTRAVENOUS

## 2018-06-25 MED ORDER — SODIUM CHLORIDE 0.9 % IV SOLN
250.0000 mL | INTRAVENOUS | Status: DC | PRN
  Administered 2018-06-28: 08:00:00 via INTRAVENOUS

## 2018-06-25 MED ORDER — AMLODIPINE BESYLATE 2.5 MG PO TABS
5.0000 mg | ORAL_TABLET | Freq: Every day | ORAL | Status: DC
  Administered 2018-06-26 – 2018-06-30 (×5): 5 mg via ORAL
  Filled 2018-06-25 (×5): qty 2

## 2018-06-25 MED ORDER — ALBUTEROL (5 MG/ML) CONTINUOUS INHALATION SOLN
10.0000 mg/h | INHALATION_SOLUTION | Freq: Once | RESPIRATORY_TRACT | Status: AC
  Administered 2018-06-25: 10 mg/h via RESPIRATORY_TRACT
  Filled 2018-06-25: qty 20

## 2018-06-25 MED ORDER — ONDANSETRON HCL 4 MG PO TABS: 4.0000 mg | ORAL_TABLET | Freq: Four times a day (QID) | ORAL | Status: DC | PRN

## 2018-06-25 MED ORDER — GUAIFENESIN ER 600 MG PO TB12
1200.0000 mg | ORAL_TABLET | Freq: Two times a day (BID) | ORAL | Status: DC
  Administered 2018-06-25 – 2018-06-30 (×10): 1200 mg via ORAL
  Filled 2018-06-25 (×10): qty 2

## 2018-06-25 MED ORDER — SIMVASTATIN 20 MG PO TABS
10.0000 mg | ORAL_TABLET | Freq: Every day | ORAL | Status: DC
  Administered 2018-06-26 – 2018-06-29 (×4): 10 mg via ORAL
  Filled 2018-06-25 (×4): qty 1

## 2018-06-25 MED ORDER — MIRABEGRON ER 50 MG PO TB24
50.0000 mg | ORAL_TABLET | Freq: Every day | ORAL | Status: DC
  Administered 2018-06-25 – 2018-06-29 (×5): 50 mg via ORAL
  Filled 2018-06-25 (×7): qty 1

## 2018-06-25 MED ORDER — METHYLPREDNISOLONE SODIUM SUCC 125 MG IJ SOLR
125.0000 mg | Freq: Once | INTRAMUSCULAR | Status: AC
  Administered 2018-06-25: 125 mg via INTRAVENOUS
  Filled 2018-06-25: qty 2

## 2018-06-25 MED ORDER — VITAMIN C 500 MG PO TABS
500.0000 mg | ORAL_TABLET | Freq: Every day | ORAL | Status: DC
  Administered 2018-06-26 – 2018-06-30 (×5): 500 mg via ORAL
  Filled 2018-06-25 (×5): qty 1

## 2018-06-25 MED ORDER — METOPROLOL SUCCINATE ER 25 MG PO TB24
25.0000 mg | ORAL_TABLET | Freq: Every day | ORAL | Status: DC
  Administered 2018-06-26 – 2018-06-30 (×5): 25 mg via ORAL
  Filled 2018-06-25 (×5): qty 1

## 2018-06-25 MED ORDER — TRAZODONE HCL 50 MG PO TABS: 25.0000 mg | ORAL_TABLET | Freq: Every evening | ORAL | Status: DC | PRN

## 2018-06-25 MED ORDER — FLUTICASONE PROPIONATE 50 MCG/ACT NA SUSP
2.0000 | Freq: Every day | NASAL | Status: DC
  Administered 2018-06-27 – 2018-06-30 (×2): 2 via NASAL
  Filled 2018-06-25 (×3): qty 16

## 2018-06-25 MED ORDER — IPRATROPIUM-ALBUTEROL 0.5-2.5 (3) MG/3ML IN SOLN
3.0000 mL | Freq: Three times a day (TID) | RESPIRATORY_TRACT | Status: DC
  Administered 2018-06-25 – 2018-06-30 (×13): 3 mL via RESPIRATORY_TRACT
  Filled 2018-06-25 (×14): qty 3

## 2018-06-25 MED ORDER — SODIUM CHLORIDE 0.9 % IV SOLN: 1.0000 g | Freq: Three times a day (TID) | INTRAVENOUS | Status: DC

## 2018-06-25 MED ORDER — ADULT MULTIVITAMIN W/MINERALS CH
1.0000 | ORAL_TABLET | Freq: Every day | ORAL | Status: DC
  Administered 2018-06-26 – 2018-06-30 (×5): 1 via ORAL
  Filled 2018-06-25 (×5): qty 1

## 2018-06-25 MED ORDER — MOMETASONE FURO-FORMOTEROL FUM 200-5 MCG/ACT IN AERO
2.0000 | INHALATION_SPRAY | Freq: Two times a day (BID) | RESPIRATORY_TRACT | Status: DC
  Administered 2018-06-25 – 2018-06-30 (×7): 2 via RESPIRATORY_TRACT
  Filled 2018-06-25 (×2): qty 8.8

## 2018-06-25 MED ORDER — NITROGLYCERIN 0.4 MG SL SUBL: 0.4000 mg | SUBLINGUAL_TABLET | SUBLINGUAL | Status: DC | PRN

## 2018-06-25 MED ORDER — MONTELUKAST SODIUM 10 MG PO TABS
10.0000 mg | ORAL_TABLET | Freq: Every day | ORAL | Status: DC
  Administered 2018-06-26 – 2018-06-30 (×5): 10 mg via ORAL
  Filled 2018-06-25 (×5): qty 1

## 2018-06-25 MED ORDER — IPRATROPIUM BROMIDE 0.02 % IN SOLN
1.0000 mg | Freq: Once | RESPIRATORY_TRACT | Status: AC
  Administered 2018-06-25: 1 mg via RESPIRATORY_TRACT
  Filled 2018-06-25: qty 5

## 2018-06-25 MED ORDER — POLYETHYLENE GLYCOL 3350 17 G PO PACK
17.0000 g | PACK | Freq: Every day | ORAL | Status: DC | PRN
  Filled 2018-06-25: qty 1

## 2018-06-25 MED ORDER — GABAPENTIN 300 MG PO CAPS
300.0000 mg | ORAL_CAPSULE | Freq: Three times a day (TID) | ORAL | Status: DC
  Administered 2018-06-25 – 2018-06-30 (×14): 300 mg via ORAL
  Filled 2018-06-25 (×14): qty 1

## 2018-06-25 MED ORDER — HEPARIN SODIUM (PORCINE) 5000 UNIT/ML IJ SOLN
5000.0000 [IU] | Freq: Three times a day (TID) | INTRAMUSCULAR | Status: DC
  Administered 2018-06-26 – 2018-06-30 (×12): 5000 [IU] via SUBCUTANEOUS
  Filled 2018-06-25 (×13): qty 1

## 2018-06-25 MED ORDER — ACETAMINOPHEN 650 MG RE SUPP: 650.0000 mg | Freq: Four times a day (QID) | RECTAL | Status: DC | PRN

## 2018-06-25 MED ORDER — PANTOPRAZOLE SODIUM 40 MG PO TBEC
40.0000 mg | DELAYED_RELEASE_TABLET | Freq: Every day | ORAL | Status: DC
  Administered 2018-06-26 – 2018-06-30 (×5): 40 mg via ORAL
  Filled 2018-06-25 (×5): qty 1

## 2018-06-25 MED ORDER — SENNA 8.6 MG PO TABS
1.0000 | ORAL_TABLET | Freq: Every day | ORAL | Status: DC
  Administered 2018-06-26 – 2018-06-30 (×5): 8.6 mg via ORAL
  Filled 2018-06-25 (×5): qty 1

## 2018-06-25 MED ORDER — CALCIUM GLUCONATE-NACL 1-0.675 GM/50ML-% IV SOLN
1.0000 g | Freq: Once | INTRAVENOUS | Status: AC
  Administered 2018-06-25: 1000 mg via INTRAVENOUS
  Filled 2018-06-25: qty 50

## 2018-06-25 MED ORDER — VANCOMYCIN HCL IN DEXTROSE 750-5 MG/150ML-% IV SOLN
750.0000 mg | Freq: Two times a day (BID) | INTRAVENOUS | Status: DC
  Administered 2018-06-26 – 2018-06-28 (×5): 750 mg via INTRAVENOUS
  Filled 2018-06-25 (×6): qty 150

## 2018-06-25 MED ORDER — ONDANSETRON HCL 4 MG/2ML IJ SOLN
4.0000 mg | Freq: Four times a day (QID) | INTRAMUSCULAR | Status: DC | PRN
  Administered 2018-06-26 – 2018-06-30 (×5): 4 mg via INTRAVENOUS
  Filled 2018-06-25 (×5): qty 2

## 2018-06-25 MED ORDER — POTASSIUM GLUCONATE 595 (99 K) MG PO TABS: 595.0000 mg | ORAL_TABLET | Freq: Every day | ORAL | Status: DC

## 2018-06-25 MED ORDER — OXYCODONE HCL 5 MG PO TABS
5.0000 mg | ORAL_TABLET | Freq: Four times a day (QID) | ORAL | Status: DC | PRN
  Administered 2018-06-25 – 2018-06-26 (×4): 10 mg via ORAL
  Administered 2018-06-27: 5 mg via ORAL
  Administered 2018-06-27: 10 mg via ORAL
  Administered 2018-06-27: 5 mg via ORAL
  Filled 2018-06-25: qty 1
  Filled 2018-06-25 (×4): qty 2
  Filled 2018-06-25: qty 1
  Filled 2018-06-25: qty 2

## 2018-06-25 MED ORDER — SODIUM CHLORIDE 0.9 % IV SOLN
2.0000 g | Freq: Three times a day (TID) | INTRAVENOUS | Status: DC
  Administered 2018-06-25 – 2018-06-28 (×9): 2 g via INTRAVENOUS
  Filled 2018-06-25 (×12): qty 2

## 2018-06-25 MED ORDER — POTASSIUM CHLORIDE CRYS ER 20 MEQ PO TBCR
40.0000 meq | EXTENDED_RELEASE_TABLET | Freq: Once | ORAL | Status: AC
  Administered 2018-06-25: 40 meq via ORAL
  Filled 2018-06-25: qty 2

## 2018-06-25 MED ORDER — ASPIRIN EC 81 MG PO TBEC
81.0000 mg | DELAYED_RELEASE_TABLET | Freq: Every day | ORAL | Status: DC
  Administered 2018-06-26 – 2018-06-30 (×5): 81 mg via ORAL
  Filled 2018-06-25 (×5): qty 1

## 2018-06-25 MED ORDER — ACETAMINOPHEN 325 MG PO TABS
650.0000 mg | ORAL_TABLET | Freq: Four times a day (QID) | ORAL | Status: DC | PRN
  Administered 2018-06-28 – 2018-06-29 (×3): 650 mg via ORAL
  Filled 2018-06-25 (×3): qty 2

## 2018-06-25 MED ORDER — IOHEXOL 350 MG/ML SOLN
75.0000 mL | Freq: Once | INTRAVENOUS | Status: AC | PRN
  Administered 2018-06-25: 75 mL via INTRAVENOUS

## 2018-06-25 MED ORDER — VANCOMYCIN HCL 10 G IV SOLR
1500.0000 mg | Freq: Once | INTRAVENOUS | Status: AC
  Administered 2018-06-25: 1500 mg via INTRAVENOUS
  Filled 2018-06-25: qty 1500

## 2018-06-25 NOTE — ED Notes (Signed)
Pt ambulating without oxygen; pt desated to 86% with SOB and light headedness with 1 lap around the nurses station. Pt stated "I can't go on"

## 2018-06-25 NOTE — ED Triage Notes (Signed)
Pt complained of SOB that started 2 days ago, but worsen today. Per pt fluid was removed from left lung 2 weeks ago. Hx of lung cancer. Pt wears 3L oxygen PRN.

## 2018-06-25 NOTE — H&P (Addendum)
History and Physical  Bradley [name] FBP:[id number] DOB: November 16, 1940 DOA: 06/25/2018  Referring physician: Thurnell Garbe  PCP: Jani Gravel, MD   Chief Complaint: SOB   HPI: Bradley [name] is a 78 y.o. 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  He had his thoracentesis with negative cytology.  1.5 L was removed.  He was followed by the CT surgery service.  They had planned to see him in the office later next week.  He was discharged on oral antibiotics which he reports taking.  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  He was recently seen by his oncologist Dr. Delton Coombes and patient decided he wanted to continue immunotherapy versus hospice.  The patient reports increasing shortness of breath and left-sided chest wall pain over the past 2 days.  He does wear oxygen 3 L as needed at home.  He has severe COPD.  He has diminished exercise tolerance.  He has been desaturating with ambulation.  He denies fever and chills.  Has a chronic cough.  He denies back pain.  ED course: The patient was noted to be hypoxic on arrival and was given Solu-Medrol and continuous nebulizer therapy.  He was ambulated in the ED and could only walk for short distance without severe shortness of breath.  He had a CTA chest with findings of no PE however reaccumulation of the loculated left pleural effusion and postobstructive pneumonia/pneumonitis was noted.  The patient started on broad-spectrum antibiotic therapy, supplemental oxygen and admission was requested.  Review of Systems: All systems reviewed and apart from history of presenting illness, are negative.  Past Medical History:  Diagnosis Date   Cancer ([place])    lung   Chronic lower back pain    Chronic pain    pain management   Contact with  stonefish as cause of accidental injury 1954   "stung across my left hand in [place]; LUE weaker since"   COPD (chronic obstructive pulmonary disease) ([place])    Coronary artery disease    Dyspnea    with exertion   High cholesterol    History of blood transfusion 1943   History of kidney stones    passed   Hypertension    Mass of left lung    Migraine    "none since the 1990s" (09/11/2016)   Myocardial infarction ([place]) 01/20/1993; 02/28/1993   Neuromuscular disorder ([place])    neuropathy left   On home O2    Pneumonia    "several times" (09/11/2016)   Tobacco abuse    ongoing   Past Surgical History:  Procedure Laterality Date   APPENDECTOMY     CARDIAC CATHETERIZATION     CATARACT EXTRACTION, BILATERAL Bilateral    COLONOSCOPY     HIP ARTHROPLASTY Right 12/20/2016   Procedure: ARTHROPLASTY BIPOLAR HIP (HEMIARTHROPLASTY);  Surgeon: [name], MD;  Location: [facility];  Service: Orthopedics;  Laterality: Right;   IR THORACENTESIS ASP PLEURAL SPACE W/IMG GUIDE  06/15/2018   JOINT REPLACEMENT     LEFT HEART CATH AND CORONARY ANGIOGRAPHY N/A 09/12/2016   Procedure: LEFT HEART CATH AND CORONARY ANGIOGRAPHY;  Surgeon: Burnell Blanks, MD;  Location: [facility];  Service: Cardiovascular;  Laterality: N/A;   NASAL ENDOSCOPY WITH EPISTAXIS CONTROL Right 03/15/2016   Procedure: NASAL ENDOSCOPY WITH EPISTAXIS CONTROL;  Surgeon:  Jodi Marble, MD;  Location: [facility];  Service: ENT;  Laterality: Right;   PORTACATH PLACEMENT Right 04/23/2018   Procedure: INSERTION PORT-A-CATH;  Surgeon: Aviva Signs, MD;  Location: [facility];  Service: General;  Laterality: Right;   SEPTOPLASTY Right 03/15/2016   Procedure: SEPTOPLASTY;  Surgeon: Jodi Marble, MD;  Location: [facility];  Service: ENT;  Laterality: Right;   SINUS EXPLORATION Right 03/15/2016   Procedure: SINUS EXPLORATION;  Surgeon: Jodi Marble, MD;  Location: [facility];  Service: ENT;  Laterality: Right;   TONSILLECTOMY      TOOTH EXTRACTION     TOTAL KNEE ARTHROPLASTY Left    VIDEO BRONCHOSCOPY WITH ENDOBRONCHIAL ULTRASOUND N/A 12/15/2017   Procedure: VIDEO BRONCHOSCOPY WITH ENDOBRONCHIAL ULTRASOUND;  Surgeon: Rigoberto Noel, MD;  Location: [facility];  Service: Thoracic;  Laterality: N/A;   Social History:  reports that he has been smoking cigarettes. He has a 70.00 pack-year smoking history. He has never used smokeless tobacco. He reports previous drug use. He reports that he does not drink alcohol.  Allergies  Allergen Reactions   Aspirin     Regular asa     Family History  Adopted: Yes  Problem Relation Age of Onset   Cancer Mother    Obesity Father     Prior to Admission medications   Medication Sig Start Date End Date Taking? Authorizing Provider  ADVAIR HFA 115-21 MCG/ACT inhaler Inhale 2 puffs into the lungs every 12 (twelve) hours. 01/29/16   [provider]  amLODipine (NORVASC) 5 MG tablet TAKE 1 TABLET BY MOUTH EVERY DAY Patient taking differently: Take 5 mg by mouth daily.  08/24/17   Lendon Colonel, NP  amoxicillin-clavulanate (AUGMENTIN) 875-125 MG tablet Take 1 tablet by mouth 2 (two) times daily for 7 days. 06/18/18 06/25/18  Aline August, MD  Ascorbic Acid (VITAMIN C) 500 MG CAPS Take 500 mg by mouth daily.    [provider]  aspirin EC 81 MG tablet Take 81 mg by mouth daily.     [provider]  celecoxib (CELEBREX) 200 MG capsule Take 1 capsule (200 mg total) by mouth daily. 12/22/16   [name], MD  cyclobenzaprine (FLEXERIL) 5 MG tablet Take 5 mg by mouth 2 (two) times daily.     [provider]  Ferrous Sulfate (IRON) 325 (65 Fe) MG TABS Take 1 tablet by mouth daily. 12/10/15   [provider]  fluticasone (FLONASE) 50 MCG/ACT nasal spray Place 2 sprays into both nostrils daily. 12/11/17   [provider]  gabapentin (NEURONTIN) 300 MG capsule Take 1 capsule by mouth 3 (three) times daily. 08/15/16   [provider]  Ipilimumab (YERVOY IV) Inject into the vein every 6 (six) weeks.    [provider]  ipratropium-albuterol (DUONEB) 0.5-2.5 (3) MG/3ML SOLN Take 3 mLs by nebulization 3 (three) times daily. 01/01/18   Johanan Skorupski L, MD  lidocaine-prilocaine (EMLA) cream Apply 1 application topically as needed. Apply a small amount to port site and cover with plastic wrap 1 hour prior to infusion appointments 04/23/18   Derek Jack, MD  lisinopril (ZESTRIL) 20 MG tablet Take 1 tablet (20 mg total) by mouth daily. 06/18/18   Aline August, MD  metoCLOPramide (REGLAN) 5 MG tablet Take 1 tablet by mouth 4 (four) times daily -  before meals and at bedtime. 01/25/16   [provider]  metoprolol succinate (TOPROL-XL) 25 MG 24 hr tablet Take 25 mg by mouth daily.  [provider]  mirabegron ER (MYRBETRIQ) 25 MG TB24 tablet Take 25 mg by mouth at bedtime.     [provider]  montelukast (SINGULAIR) 10 MG tablet Take 1 tablet by mouth daily. 06/02/18   [provider]  Multiple Vitamins-Minerals (MULTIVITAMIN WITH MINERALS) tablet Take 1 tablet by mouth daily.    [provider]  nitroGLYCERIN (NITROSTAT) 0.4 MG SL tablet Place 0.4 mg under the tongue every 5 (five) minutes as needed for chest pain.    [provider]  Nivolumab (OPDIVO IV) Inject into the vein every 14 (fourteen) days.    [provider]  omeprazole (PRILOSEC) 40 MG capsule Take 40 mg by mouth daily.     [provider]  oxyCODONE (OXY IR/ROXICODONE) 5 MG immediate release tablet Take 1 tablet (5 mg total) by mouth every 4 (four) hours as needed for severe pain. 04/23/18   Aviva Signs, MD  polyethylene glycol (MIRALAX / GLYCOLAX) 17 g packet Take 17 g by mouth daily as needed for mild constipation. 06/18/18   Aline August, MD  potassium gluconate (HM POTASSIUM) 595 (99 K) MG TABS tablet Take 595 mg by mouth daily.    [provider]    senna (SENOKOT) 8.6 MG TABS tablet Take 1 tablet (8.6 mg total) by mouth daily. 06/18/18   Aline August, MD  simvastatin (ZOCOR) 10 MG tablet Take 10 mg by mouth at bedtime.  12/23/15   [provider]  VENTOLIN HFA 108 (90 Base) MCG/ACT inhaler Inhale 2 puffs into the lungs every 4 (four) hours as needed for wheezing or shortness of breath.  12/11/15   [provider]   Physical Exam: Vitals:   06/25/18 1050 06/25/18 1200 06/25/18 1230 06/25/18 1300  BP:  122/62 (!) 127/44 138/74  Pulse:  76 77 78  Resp:   (!) 23   Temp:      TempSrc:      SpO2: 100% 98% 99% 100%  Weight:      Height:         General exam: Moderately built and nourished patient, lying comfortably supine on the gurney in no obvious distress.  Head, eyes and ENT: Nontraumatic and normocephalic. Pupils equally reacting to light and accommodation. Oral mucosa moist.  Neck: Supple. No JVD, carotid bruit or thyromegaly.  Lymphatics: No lymphadenopathy.  Respiratory system: coarse wheezing, diminished BS LLL. Moderate increased work of breathing.  Cardiovascular system: S1 and S2 heard, tachycardic. No JVD, murmurs, gallops, clicks or pedal edema.  Gastrointestinal system: Abdomen is nondistended, soft and nontender. Normal bowel sounds heard. No organomegaly or masses appreciated.  Central nervous system: Alert and oriented. No focal neurological deficits.  Extremities: Symmetric 5 x 5 power. Peripheral pulses symmetrically felt.   Skin: No rashes or acute findings.  Musculoskeletal system: Negative exam.  Psychiatry: Pleasant and cooperative.  Labs on Admission:  Basic Metabolic Panel: Recent Labs  Lab 06/25/18 0915  NA 140  K 2.9*  CL 114*  CO2 18*  GLUCOSE 85  BUN 13  CREATININE 0.70  CALCIUM 6.7*   Liver Function Tests: No results for input(s): AST, ALT, ALKPHOS, BILITOT, PROT, ALBUMIN in the last 168 hours. No results for input(s): LIPASE, AMYLASE in the last 168  hours. No results for input(s): AMMONIA in the last 168 hours. CBC: Recent Labs  Lab 06/25/18 0915  WBC 12.0*  NEUTROABS 7.7  HGB 15.5  HCT 48.4  MCV 88.6  PLT 580*   Cardiac Enzymes: Recent Labs  Lab 06/25/18 0915  TROPONINI <0.03    BNP (last 3 results) No results for input(s): PROBNP in the last 8760 hours. CBG: No results for input(s): GLUCAP in the last 168 hours.  Radiological Exams on Admission: Ct Angio Chest Pe W/cm &/or Wo Cm  Result Date: 06/25/2018 CLINICAL DATA:  History of lung cancer. Status post left thoracentesis 2 weeks ago. Now with shortness of breath which began 2 days ago. EXAM: CT ANGIOGRAPHY CHEST WITH CONTRAST TECHNIQUE: Multidetector CT imaging of the chest was performed using the standard protocol during bolus administration of intravenous contrast. Multiplanar CT image reconstructions and MIPs were obtained to evaluate the vascular anatomy. CONTRAST:  78mL OMNIPAQUE IOHEXOL 350 MG/ML SOLN COMPARISON:  06/13/2018 FINDINGS: Cardiovascular: Normal heart size. There is no pericardial effusion identified. Aortic atherosclerosis. The main pulmonary artery appears patent. No saddle embolus. No central obstructing pulmonary emboli identified. No lobar or segmental pulmonary artery filling defects identified. Mediastinum/Nodes: Normal appearance of the thyroid gland. The trachea appears patent and is midline. Normal appearance of the esophagus. No axillary or supraclavicular adenopathy. Hilar and mediastinal adenopathy is again identified and is unchanged from 06/13/18. -right paratracheal lymph node measures 1.4 cm, image 42/4. Index right hilar lymph node is unchanged at 2.0 cm, image 51/4. -subcarinal lymph node measures 1.3 cm, image 57/4. -left hilar lymph node is unchanged measuring 1.9 cm. Lungs/Pleura: Moderate volume loculated left pleural effusion is again identified overlying the left lung. Similar in volume to previous exam. Advanced centrilobular and  paraseptal emphysema. There is a large area of masslike architectural distortion within the perihilar left lung encasing the left hilar structures measuring 8.9 cm, image 139/5. Similar to previous exam. Additionally extensive airspace densities and fibrosis extends into the left upper lobe and left apex. Findings are favored to represent a combination of progressive tumor, radiation change, and potentially postobstructive pneumonitis. The right lung is clear. Upper Abdomen: Previously noted hypermetabolic tumor within the posteromedial right lobe of liver is again noted measuring 2.9 cm, image 92/4. There is a subcapsular low-attenuation mass overlying segment 6/5 measuring 4.1 cm. New from 02/15/2018 PET-CT. Gallstones. Musculoskeletal: No chest wall abnormality. No acute or significant osseous findings. Unchanged T11 compression deformity. Review of the MIP images confirms the above findings. IMPRESSION: 1. No evidence for acute pulmonary embolus. 2. Interval reaccumulation of loculated left pleural effusion status post thoracentesis on 06/15/2018. 3. Persistent masslike architectural distortion and airspace consolidation within the left lung which may reflect progressive tumor and/or postobstructive pneumonitis or pneumonia. 4. No change in bilateral hilar and mediastinal adenopathy. 5. Similar appearance of liver metastasis from 06/13/2018. 6. Aortic Atherosclerosis (ICD10-I70.0) and Emphysema (ICD10-J43.9). Electronically Signed   By: Kerby Moors M.D.   On: 06/25/2018 12:02   Dg Chest Port 1 View  Result Date: 06/25/2018 CLINICAL DATA:  Shortness of breath. History of small cell lung cancer. EXAM: PORTABLE CHEST 1 VIEW COMPARISON:  Chest x-ray dated Jun 22, 2018. FINDINGS: Unchanged right chest wall port catheter. Normal heart size. Severe emphysema and extensive pulmonary scarring again noted. Unchanged loculated left pleural effusion with left upper greater than lower lobe airspace disease. The right  lung is clear. No pneumothorax. No acute osseous abnormality. IMPRESSION: 1. Unchanged extensive left lung airspace disease and loculated pleural effusion superimposed on severe emphysema. Electronically Signed   By: Titus Dubin M.D.   On: 06/25/2018 09:36    EKG: Independently reviewed. NSR  Assessment/Plan Principal Problem:   Recurrent left pleural effusion Active Problems:   Tobacco  abuse   Emphysema, unspecified ([place])   CAD (coronary artery disease), native coronary artery   Essential hypertension   Leukocytosis   Abnormal chest x-ray   Hypokalemia   Non-small cell carcinoma of left lung ([facility])   Postobstructive pneumonia   On home O2   Acute on chronic respiratory failure ([place])   1. Acute on chronic respiratory failure secondary to pneumonia and recurrent loculated left pleural effusion- the patient had good relief from the recent thoracentesis done on 06/15/2018 with 1.5 L removed at that time.  We are unable to obtain thoracentesis at this facility will admit the patient to [facility].  Will consult CTS surgery to see him as well.  Place an order for ultrasound-guided thoracentesis with the fluid sent for cytology. 2. Postobstructive pneumonia-with recent hospitalization we will cover him for nosocomial organisms.  Continue cefepime/vancomycin.  Continue supportive therapy. 3. Severe COPD- we are resuming his home schedule bronchodilator therapy. 4. Tobacco abuse-patient has been smoking heavily since discharge.  He is unwilling to stop at this time. 5. Metastatic non-small cell lung carcinoma- patient recently saw his oncologist Dr. Delton Coombes and wants to pursue ongoing immunotherapy and plans to have a port placed next week. 6. Leukocytosis-secondary to pneumonia and steroids-follow CBC. 7. Essential hypertension-resume home blood pressure medications.  Currently blood pressure well controlled. 8. Hypokalemia- oral potassium supplement given in ED.  Recheck in a.m.  Check  magnesium. 9. CAD-resume home cardiac medications. 10. Relative hypocalcemia - add albumin to labs to calculate corrected calcium, replace as needed.   I called and spoke with CT surgery service and requested inpatient consult at [facility]. I spoke with Dr. Dyann Kief at [facility] who will assume care when patient arrives at [facility]. Placed order for US thoracentesis with cytology.   DVT Prophylaxis: Subcu heparin Code Status: DNR Family Communication:   Disposition Plan: inpatient   Time spent: 56 minutes  Courteny Egler Wynetta Emery, MD [facility] How to contact the [facility] Attending or Consulting provider [place] or covering provider during after hours [place], for this patient?  1. Check the care team in [facility] and look for a) attending/consulting [facility] provider listed and b) the [facility] team listed 2. Log into [email or web address] and use [facility]'s universal password to access. If you do not have the password, please contact the hospital operator. 3. Locate the [facility] provider you are looking for under [facility] and page to a number that you can be directly reached. 4. If you still have difficulty reaching the provider, please page the [facility] (Director on Call) for the Hospitalists listed on amion for assistance.

## 2018-06-25 NOTE — ED Provider Notes (Signed)
[facility] EMERGENCY DEPARTMENT Provider Note   CSN: [id number] Arrival date & time: 06/25/18  4098    History   Chief Complaint Chief Complaint  Patient presents with   Shortness of Breath    HPI Bradley [name] is a 78 y.o. male.     HPI  Pt was seen at 0855.  Per pt, c/o gradual onset and worsening of persistent SOB for the past 2 days. States he has been wearing home O2 "as needed," and continues to smoke cigarettes. Pt states the SOB is "constant." Pt states he does not have a rescue inhaler or neb to use when he becomes SOB. Denies fevers, known COVID+ exposures.  Denies CP/palpitations, no back pain, no abd pain, no N/V/D, no injury, no rash.    Past Medical History:  Diagnosis Date   Cancer ([place])    lung   Chronic lower back pain    Chronic pain    pain management   Contact with stonefish as cause of accidental injury 1954   "stung across my left hand in [place]; LUE weaker since"   COPD (chronic obstructive pulmonary disease) ([place])    Coronary artery disease    Dyspnea    with exertion   High cholesterol    History of blood transfusion 1943   History of kidney stones    passed   Hypertension    Mass of left lung    Migraine    "none since the 1990s" (09/11/2016)   Myocardial infarction ([place]) 01/20/1993; 02/28/1993   Neuromuscular disorder ([place])    neuropathy left   On home O2    Pneumonia    "several times" (09/11/2016)   Tobacco abuse    ongoing    Patient Active Problem List   Diagnosis Date Noted   Metastatic cancer [facility])    Palliative care encounter    Encounter for hospice care discussion    Pneumonia 06/13/2018   Acute maxillary sinusitis 12/30/2017   Palliative care by specialist    Goals of care, counseling/discussion    Malignant neoplasm of left lung ([place])    Left leg weakness    Nosocomial pneumonia 12/29/2017   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   Mass of lower lobe of left lung  12/10/2017   Chest pain in adult    Syncope and collapse    Rt Hip fracture ([place]) 12/19/2016   Chest pain 09/12/2016   Leukocytosis 09/12/2016   Abnormal chest x-ray 09/12/2016   Hyperlipidemia 09/12/2016   Hypokalemia 09/12/2016   Precordial pain    Tobacco abuse 09/11/2016   Emphysema, unspecified ([place]) 09/11/2016   CAD (coronary artery disease), native coronary artery 09/11/2016   Chronic pain syndrome 09/11/2016   Essential hypertension 09/11/2016   Right-sided epistaxis 03/15/2016    Past Surgical History:  Procedure Laterality Date   APPENDECTOMY     CARDIAC CATHETERIZATION     CATARACT EXTRACTION, BILATERAL Bilateral    COLONOSCOPY     HIP ARTHROPLASTY Right 12/20/2016   Procedure: ARTHROPLASTY BIPOLAR HIP (HEMIARTHROPLASTY);  Surgeon: [name], MD;  Location: [place];  Service: Orthopedics;  Laterality: Right;   IR THORACENTESIS ASP PLEURAL SPACE W/IMG GUIDE  06/15/2018   JOINT REPLACEMENT     LEFT HEART CATH AND CORONARY ANGIOGRAPHY N/A 09/12/2016   Procedure: LEFT HEART CATH AND CORONARY ANGIOGRAPHY;  Surgeon: Burnell Blanks, MD;  Location: [place] [facility];  Service: Cardiovascular;  Laterality: N/A;   NASAL ENDOSCOPY WITH EPISTAXIS CONTROL Right  03/15/2016   Procedure: NASAL ENDOSCOPY WITH EPISTAXIS CONTROL;  Surgeon: Jodi Marble, MD;  Location: [place];  Service: ENT;  Laterality: Right;   PORTACATH PLACEMENT Right 04/23/2018   Procedure: INSERTION PORT-A-CATH;  Surgeon: Aviva Signs, MD;  Location: [facility];  Service: General;  Laterality: Right;   SEPTOPLASTY Right 03/15/2016   Procedure: SEPTOPLASTY;  Surgeon: Jodi Marble, MD;  Location: [facility];  Service: ENT;  Laterality: Right;   SINUS EXPLORATION Right 03/15/2016   Procedure: SINUS EXPLORATION;  Surgeon: Jodi Marble, MD;  Location: [place];  Service: ENT;  Laterality: Right;   TONSILLECTOMY     TOOTH EXTRACTION     TOTAL KNEE ARTHROPLASTY Left    VIDEO BRONCHOSCOPY  WITH ENDOBRONCHIAL ULTRASOUND N/A 12/15/2017   Procedure: VIDEO BRONCHOSCOPY WITH ENDOBRONCHIAL ULTRASOUND;  Surgeon: Rigoberto Noel, MD;  Location: [place];  Service: Thoracic;  Laterality: N/A;        Home Medications    Prior to Admission medications   Medication Sig Start Date End Date Taking? Authorizing Provider  ADVAIR HFA 115-21 MCG/ACT inhaler Inhale 2 puffs into the lungs every 12 (twelve) hours. 01/29/16   [provider]  amLODipine (NORVASC) 5 MG tablet TAKE 1 TABLET BY MOUTH EVERY DAY Patient taking differently: Take 5 mg by mouth daily.  08/24/17   Lendon Colonel, NP  amoxicillin-clavulanate (AUGMENTIN) 875-125 MG tablet Take 1 tablet by mouth 2 (two) times daily for 7 days. 06/18/18 06/25/18  Aline August, MD  Ascorbic Acid (VITAMIN C) 500 MG CAPS Take 500 mg by mouth daily.    [provider]  aspirin EC 81 MG tablet Take 81 mg by mouth daily.     [provider]  celecoxib (CELEBREX) 200 MG capsule Take 1 capsule (200 mg total) by mouth daily. 12/22/16   [name], MD  cyclobenzaprine (FLEXERIL) 5 MG tablet Take 5 mg by mouth 2 (two) times daily.     [provider]  Ferrous Sulfate (IRON) 325 (65 Fe) MG TABS Take 1 tablet by mouth daily. 12/10/15   [provider]  fluticasone (FLONASE) 50 MCG/ACT nasal spray Place 2 sprays into both nostrils daily. 12/11/17   [provider]  gabapentin (NEURONTIN) 300 MG capsule Take 1 capsule by mouth 3 (three) times daily. 08/15/16   [provider]  Ipilimumab (YERVOY IV) Inject into the vein every 6 (six) weeks.    [provider]  ipratropium-albuterol (DUONEB) 0.5-2.5 (3) MG/3ML SOLN Take 3 mLs by nebulization 3 (three) times daily. 01/01/18   Johnson, Clanford L, MD  lidocaine-prilocaine (EMLA) cream Apply 1 application topically as needed. Apply a small amount to port site and cover with plastic wrap 1 hour prior to infusion appointments 04/23/18    Derek Jack, MD  lisinopril (ZESTRIL) 20 MG tablet Take 1 tablet (20 mg total) by mouth daily. 06/18/18   Aline August, MD  metoCLOPramide (REGLAN) 5 MG tablet Take 1 tablet by mouth 4 (four) times daily -  before meals and at bedtime. 01/25/16   [provider]  metoprolol succinate (TOPROL-XL) 25 MG 24 hr tablet Take 25 mg by mouth daily.    [provider]  mirabegron ER (MYRBETRIQ) 25 MG TB24 tablet Take 25 mg by mouth at bedtime.     [provider]  montelukast (SINGULAIR) 10 MG tablet Take 1 tablet by mouth daily. 06/02/18   [provider]  Multiple Vitamins-Minerals (MULTIVITAMIN WITH MINERALS) tablet Take 1 tablet by mouth daily.  [provider]  nitroGLYCERIN (NITROSTAT) 0.4 MG SL tablet Place 0.4 mg under the tongue every 5 (five) minutes as needed for chest pain.    [provider]  Nivolumab (OPDIVO IV) Inject into the vein every 14 (fourteen) days.    [provider]  omeprazole (PRILOSEC) 40 MG capsule Take 40 mg by mouth daily.     [provider]  oxyCODONE (OXY IR/ROXICODONE) 5 MG immediate release tablet Take 1 tablet (5 mg total) by mouth every 4 (four) hours as needed for severe pain. 04/23/18   Aviva Signs, MD  polyethylene glycol (MIRALAX / GLYCOLAX) 17 g packet Take 17 g by mouth daily as needed for mild constipation. 06/18/18   Aline August, MD  potassium gluconate (HM POTASSIUM) 595 (99 K) MG TABS tablet Take 595 mg by mouth daily.    [provider]  senna (SENOKOT) 8.6 MG TABS tablet Take 1 tablet (8.6 mg total) by mouth daily. 06/18/18   Aline August, MD  simvastatin (ZOCOR) 10 MG tablet Take 10 mg by mouth at bedtime.  12/23/15   [provider]  VENTOLIN HFA 108 (90 Base) MCG/ACT inhaler Inhale 2 puffs into the lungs every 4 (four) hours as needed for wheezing or shortness of breath.  12/11/15   [provider]    Family History Family History  Adopted:  Yes  Problem Relation Age of Onset   Cancer Mother    Obesity Father     Social History Social History   Tobacco Use   Smoking status: Current Every Day Smoker    Packs/day: 1.00    Years: 70.00    Pack years: 70.00    Types: Cigarettes   Smokeless tobacco: Never Used   Tobacco comment: smokes 8 a day  Substance Use Topics   Alcohol use: No    Comment: 11/26/2017  "drank from the age of 71 til 03/05/1993"   Drug use: Not Currently    Comment: 11/26/2017 "used some drugs when I was young"     Allergies   Aspirin   Review of Systems Review of Systems ROS: Statement: All systems negative except as marked or noted in the HPI; Constitutional: Negative for fever and chills. ; ; Eyes: Negative for eye pain, redness and discharge. ; ; ENMT: Negative for ear pain, hoarseness, nasal congestion, sinus pressure and sore throat. ; ; Cardiovascular: Negative for chest pain, palpitations, diaphoresis, and peripheral edema. ; ; Respiratory: +SOB. Negative for cough, wheezing and stridor. ; ; Gastrointestinal: Negative for nausea, vomiting, diarrhea, abdominal pain, blood in stool, hematemesis, jaundice and rectal bleeding. . ; ; Genitourinary: Negative for dysuria, flank pain and hematuria. ; ; Musculoskeletal: Negative for back pain and neck pain. Negative for swelling and trauma.; ; Skin: Negative for pruritus, rash, abrasions, blisters, bruising and skin lesion.; ; Neuro: Negative for headache, lightheadedness and neck stiffness. Negative for weakness, altered level of consciousness, altered mental status, extremity weakness, paresthesias, involuntary movement, seizure and syncope.       Physical Exam Updated Vital Signs BP (!) 161/88 (BP Location: Left Arm)    Pulse 77    Temp (!) 97.5 F (36.4 C) (Oral)    Resp (!) 22    Ht 6' (1.829 m)    Wt 65.8 kg    SpO2 98%    BMI 19.67 kg/m   Patient Vitals for the past 24 hrs:  BP Temp Temp src Pulse Resp SpO2 Height Weight  06/25/18 1300  138/74 -- --  78 -- 100 % -- --  06/25/18 1230 (!) 127/44 -- -- 77 (!) 23 99 % -- --  06/25/18 1200 122/62 -- -- 76 -- 98 % -- --  06/25/18 1050 -- -- -- -- -- 100 % -- --  06/25/18 1030 137/71 -- -- -- 18 98 % -- --  06/25/18 1000 126/71 -- -- 72 (!) 22 100 % -- --  06/25/18 0930 132/78 -- -- 73 17 100 % -- --  06/25/18 0900 126/76 -- -- 75 (!) 25 100 % -- --  06/25/18 0851 -- -- -- -- -- 98 % -- --  06/25/18 0848 (!) 161/88 (!) 97.5 F (36.4 C) Oral 77 (!) 22 98 % -- --  06/25/18 0847 -- -- -- -- -- -- 6' (1.829 m) 65.8 kg      Physical Exam 0900: Physical examination:  Nursing notes reviewed; Vital signs and O2 SAT reviewed;  Constitutional: Well developed, Well nourished, Well hydrated, In no acute distress; Head:  Normocephalic, atraumatic; Eyes: EOMI, PERRL, No scleral icterus; ENMT: Mouth and pharynx normal, Mucous membranes moist; Neck: Supple, Full range of motion, No lymphadenopathy; Cardiovascular: Regular rate and rhythm, No gallop; Respiratory: Breath sounds diminished, esp left lung, No wheezes.  Speaking sentences, sitting upright, mildly tachypneic, Normal respiratory effort/excursion; Chest: Nontender, Movement normal; Abdomen: Soft, Nontender, Nondistended, Normal bowel sounds; Genitourinary: No CVA tenderness; Extremities: Peripheral pulses normal, No tenderness, No edema, No calf edema or asymmetry.; Neuro: AA&Ox3, Major CN grossly intact.  Speech clear. No gross focal motor or sensory deficits in extremities.; Skin: Color normal, Warm, Dry.     ED Treatments / Results  Labs (all labs ordered are listed, but only abnormal results are displayed)   EKG None  Radiology   Procedures Procedures (including critical care time)  Medications Ordered in ED Medications  methylPREDNISolone sodium succinate (SOLU-MEDROL) 125 mg/2 mL injection 125 mg (has no administration in time range)  albuterol (PROVENTIL,VENTOLIN) solution continuous neb (has no administration in  time range)  ipratropium (ATROVENT) nebulizer solution 1 mg (has no administration in time range)  ceFEPIme (MAXIPIME) 1 g in sodium chloride 0.9 % 100 mL IVPB (has no administration in time range)  albuterol (VENTOLIN HFA) 108 (90 Base) MCG/ACT inhaler 8 puff (8 puffs Inhalation Given 06/25/18 1049)  iohexol (OMNIPAQUE) 350 MG/ML injection 75 mL (75 mLs Intravenous Contrast Given 06/25/18 1136)     Initial Impression / Assessment and Plan / ED Course  I have reviewed the triage vital signs and the nursing notes.  Pertinent labs & imaging results that were available during my care of the patient were reviewed by me and considered in my medical decision making (see chart for details).    MDM Reviewed: previous chart, nursing note and vitals Reviewed previous: labs and ECG Interpretation: labs, ECG, x-ray and CT scan Total time providing critical care: 30-74 minutes. This excludes time spent performing separately reportable procedures and services. Consults: admitting MD   CRITICAL CARE Performed by: Francine Graven Total critical care time: 35 minutes Critical care time was exclusive of separately billable procedures and treating other patients. Critical care was necessary to treat or prevent imminent or life-threatening deterioration. Critical care was time spent personally by me on the following activities: development of treatment plan with patient and/or surrogate as well as nursing, discussions with consultants, evaluation of patient's response to treatment, examination of patient, obtaining history from patient or surrogate, ordering and performing treatments and interventions, ordering and review of laboratory studies, ordering  and review of radiographic studies, pulse oximetry and re-evaluation of patient's condition.   ED ECG REPORT   Date: 06/25/2018  Rate: 74  Rhythm: normal sinus rhythm  QRS Axis: normal  Intervals: normal  ST/T Wave abnormalities: normal, baseline  wander, artifact  Conduction Disutrbances:none  Narrative Interpretation:   Old EKG Reviewed: unchanged; no significant changes from previous EKG dated 06/13/2018. I have personally reviewed the EKG tracing and agree with the computerized printout as noted.   Results for orders placed or performed during the hospital encounter of 06/25/18  SARS Coronavirus 2 (CEPHEID - Performed in [place] hospital lab), [facility] Order  Result Value Ref Range   SARS Coronavirus 2 NEGATIVE NEGATIVE  Basic metabolic panel  Result Value Ref Range   Sodium 140 135 - 145 mmol/L   Potassium 2.9 (L) 3.5 - 5.1 mmol/L   Chloride 114 (H) 98 - 111 mmol/L   CO2 18 (L) 22 - 32 mmol/L   Glucose, Bld 85 70 - 99 mg/dL   BUN 13 8 - 23 mg/dL   Creatinine, Ser 0.70 0.61 - 1.24 mg/dL   Calcium 6.7 (L) 8.9 - 10.3 mg/dL   GFR calc non Af Amer >60 >60 mL/min   GFR calc Af Amer >60 >60 mL/min   Anion gap 8 5 - 15  Brain natriuretic peptide  Result Value Ref Range   B Natriuretic Peptide 52.0 0.0 - 100.0 pg/mL  Troponin I - Once  Result Value Ref Range   Troponin I <0.03 <0.03 ng/mL  CBC with Differential  Result Value Ref Range   WBC 12.0 (H) 4.0 - 10.5 K/uL   RBC 5.46 4.22 - 5.81 MIL/uL   Hemoglobin 15.5 13.0 - 17.0 g/dL   HCT 48.4 39.0 - 52.0 %   MCV 88.6 80.0 - 100.0 fL   MCH 28.4 26.0 - 34.0 pg   MCHC 32.0 30.0 - 36.0 g/dL   RDW 18.6 (H) 11.5 - 15.5 %   Platelets 580 (H) 150 - 400 K/uL   nRBC 0.0 0.0 - 0.2 %   Neutrophils Relative % 64 %   Neutro Abs 7.7 1.7 - 7.7 K/uL   Lymphocytes Relative 26 %   Lymphs Abs 3.1 0.7 - 4.0 K/uL   Monocytes Relative 5 %   Monocytes Absolute 0.6 0.1 - 1.0 K/uL   Eosinophils Relative 4 %   Eosinophils Absolute 0.5 0.0 - 0.5 K/uL   Basophils Relative 1 %   Basophils Absolute 0.1 0.0 - 0.1 K/uL   Immature Granulocytes 0 %   Abs Immature Granulocytes 0.05 0.00 - 0.07 K/uL  Urinalysis, Routine w reflex microscopic  Result Value Ref Range   Color, Urine AMBER (A) YELLOW    APPearance HAZY (A) CLEAR   Specific Gravity, Urine >1.030 (H) 1.005 - 1.030   pH 5.5 5.0 - 8.0   Glucose, UA NEGATIVE NEGATIVE mg/dL   Hgb urine dipstick NEGATIVE NEGATIVE   Bilirubin Urine NEGATIVE NEGATIVE   Ketones, ur TRACE (A) NEGATIVE mg/dL   Protein, ur NEGATIVE NEGATIVE mg/dL   Nitrite NEGATIVE NEGATIVE   Leukocytes,Ua NEGATIVE NEGATIVE  Lactic acid, plasma  Result Value Ref Range   Lactic Acid, Venous 1.0 0.5 - 1.9 mmol/L  Lactic acid, plasma  Result Value Ref Range   Lactic Acid, Venous 1.6 0.5 - 1.9 mmol/L     Ct Angio Chest Pe W/cm &/or Wo Cm Result Date: 06/25/2018 CLINICAL DATA:  History of lung cancer. Status post left thoracentesis 2 weeks ago.  Now with shortness of breath which began 2 days ago. EXAM: CT ANGIOGRAPHY CHEST WITH CONTRAST TECHNIQUE: Multidetector CT imaging of the chest was performed using the standard protocol during bolus administration of intravenous contrast. Multiplanar CT image reconstructions and MIPs were obtained to evaluate the vascular anatomy. CONTRAST:  39mL OMNIPAQUE IOHEXOL 350 MG/ML SOLN COMPARISON:  06/13/2018 FINDINGS: Cardiovascular: Normal heart size. There is no pericardial effusion identified. Aortic atherosclerosis. The main pulmonary artery appears patent. No saddle embolus. No central obstructing pulmonary emboli identified. No lobar or segmental pulmonary artery filling defects identified. Mediastinum/Nodes: Normal appearance of the thyroid gland. The trachea appears patent and is midline. Normal appearance of the esophagus. No axillary or supraclavicular adenopathy. Hilar and mediastinal adenopathy is again identified and is unchanged from 06/13/18. -right paratracheal lymph node measures 1.4 cm, image 42/4. Index right hilar lymph node is unchanged at 2.0 cm, image 51/4. -subcarinal lymph node measures 1.3 cm, image 57/4. -left hilar lymph node is unchanged measuring 1.9 cm. Lungs/Pleura: Moderate volume loculated left pleural effusion is  again identified overlying the left lung. Similar in volume to previous exam. Advanced centrilobular and paraseptal emphysema. There is a large area of masslike architectural distortion within the perihilar left lung encasing the left hilar structures measuring 8.9 cm, image 139/5. Similar to previous exam. Additionally extensive airspace densities and fibrosis extends into the left upper lobe and left apex. Findings are favored to represent a combination of progressive tumor, radiation change, and potentially postobstructive pneumonitis. The right lung is clear. Upper Abdomen: Previously noted hypermetabolic tumor within the posteromedial right lobe of liver is again noted measuring 2.9 cm, image 92/4. There is a subcapsular low-attenuation mass overlying segment 6/5 measuring 4.1 cm. New from 02/15/2018 PET-CT. Gallstones. Musculoskeletal: No chest wall abnormality. No acute or significant osseous findings. Unchanged T11 compression deformity. Review of the MIP images confirms the above findings. IMPRESSION: 1. No evidence for acute pulmonary embolus. 2. Interval reaccumulation of loculated left pleural effusion status post thoracentesis on 06/15/2018. 3. Persistent masslike architectural distortion and airspace consolidation within the left lung which may reflect progressive tumor and/or postobstructive pneumonitis or pneumonia. 4. No change in bilateral hilar and mediastinal adenopathy. 5. Similar appearance of liver metastasis from 06/13/2018. 6. Aortic Atherosclerosis (ICD10-I70.0) and Emphysema (ICD10-J43.9). Electronically Signed   By: Kerby Moors M.D.   On: 06/25/2018 12:02     Dg Chest Port 1 View Result Date: 06/25/2018 CLINICAL DATA:  Shortness of breath. History of small cell lung cancer. EXAM: PORTABLE CHEST 1 VIEW COMPARISON:  Chest x-ray dated Jun 22, 2018. FINDINGS: Unchanged right chest wall port catheter. Normal heart size. Severe emphysema and extensive pulmonary scarring again noted.  Unchanged loculated left pleural effusion with left upper greater than lower lobe airspace disease. The right lung is clear. No pneumothorax. No acute osseous abnormality. IMPRESSION: 1. Unchanged extensive left lung airspace disease and loculated pleural effusion superimposed on severe emphysema. Electronically Signed   By: Titus Dubin M.D.   On: 06/25/2018 09:36    Bradley [name] was evaluated in Emergency Department on 06/25/2018 for the symptoms described in the history of present illness. He was evaluated in the context of the global COVID-19 pandemic, which necessitated consideration that the patient might be at risk for infection with the SARS-CoV-2 virus that causes COVID-19. Institutional protocols and algorithms that pertain to the evaluation of patients at risk for COVID-19 are in a state of rapid change based on information released by regulatory bodies including the CDC and federal  and state organizations. These policies and algorithms were followed during the patient's care in the ED.    1335:  CT with re-accumulation of loculated pleural effusion with pneumonia; likely needs repeat thoracentesis (?drain). IV abx started. Pt given albuterol MDI 8 puffs with O2 Sats at rest 98-100%. Pt ambulated with O2 Sats dropping to 86% R/A with pt c/o increasing SOB, near syncope and needing to stop to catch his breath. Pt escorted back to exam room. IV solumedrol and hour long neb ordered. Potassium repleted PO.  T/C returned from [facility] Dr. Wynetta Emery, case discussed, including:  HPI, pertinent PM/SHx, VS/PE, dx testing, ED course and treatment:  Agreeable to admit, possibly transfer to [facility].        Final Clinical Impressions(s) / ED Diagnoses   Final diagnoses:  None    ED Discharge Orders    None       Francine Graven, DO 06/30/18 [id number]

## 2018-06-25 NOTE — Progress Notes (Signed)
Procedure(s) (LRB): INSERTION PLEURAL DRAINAGE CATHETER (Left) Subjective: Patient examined, CT chest images personally reviewed. 78 yo with advanced lung cancer, liver mets. Re- accumulation of L pleural effusion 2 weeks after L thoracentesis.He is symptomatic Fluid was negative for malignancy at  time of thoracentesis. He is on home O2 and receiving chemotherapy in [place]. I discussed L Pleurx catheter placement with patient and have set up for Mon am Ok for L thoracentesis this weekend but DO NOT TAP DRY .  Objective: Vital signs in last 24 hours: Temp:  [97.5 F (36.4 C)-98.6 F (37 C)] 97.9 F (36.6 C) (05/29 1606) Pulse Rate:  [72-85] 85 (05/29 1606) Cardiac Rhythm: Normal sinus rhythm (05/29 1604) Resp:  [15-25] 20 (05/29 1606) BP: (122-161)/(44-88) 152/76 (05/29 1606) SpO2:  [98 %-100 %] 99 % (05/29 1606) Weight:  [65.8 kg] 65.8 kg (05/29 0847)  Hemodynamic parameters for last 24 hours:  nsr  Intake/Output from previous day: No intake/output data recorded. Intake/Output this shift: Total I/O In: 200.2 [IV Piggyback:200.2] Out: -   EXAM  Decreased breath sounds on L NSR neuro intact  Lab Results: Recent Labs    06/25/18 0915 06/25/18 1632  WBC 12.0* 11.6*  HGB 15.5 14.6  HCT 48.4 45.1  PLT 580* 575*   BMET:  Recent Labs    06/25/18 0915 06/25/18 1632  NA 140  --   K 2.9*  --   CL 114*  --   CO2 18*  --   GLUCOSE 85  --   BUN 13  --   CREATININE 0.70 1.19  CALCIUM 6.7*  --     PT/INR: No results for input(s): LABPROT, INR in the last 72 hours. ABG    Component Value Date/Time   TCO2 25 03/15/2016 0747   CBG (last 3)  No results for input(s): GLUCAP in the last 72 hours.  Assessment/Plan: S/P Procedure(s) (LRB): INSERTION PLEURAL DRAINAGE CATHETER (Left) Recurrent L pleural effusion, hx L lung cancer Plan L pleurx Mon am- patient agrees   LOS: 0 days    Tharon Aquas Trigt III 06/25/2018

## 2018-06-25 NOTE — Patient Outreach (Signed)
[facility]) Care Management  06/25/2018  Macalister Arnaud 22-Apr-1940 [id number]  EMMI: pneumonia red alert Referral date: 06/25/18 Referral reason: feeling better overall: no,   More short of breath than yesterday: yes Insurance: [employer] Day # 2  Attempt #1  Telephone call to patient regarding EMMI pneumonia red alert. HIPAA verified with patient. Patient states he is currently in the emergency room.    PLAN:  RNCM will attempt 2nd telephone call to patient within 4 business days.  RNCM will send patient outreach letter to attempt contact  Quinn Plowman RN,BSN,CCM [facility] Telephonic  [phone]

## 2018-06-25 NOTE — ED Notes (Signed)
ED TO INPATIENT HANDOFF REPORT  ED Nurse Name and Phone #: Mechele Claude [phone]  S Name/Age/Gender Bradley [name] 78 y.o. male Room/Bed: APA05/APA05  Code Status   Code Status: Prior  Home/SNF/Other Home Patient oriented to: self, place, time and situation Is this baseline? Yes   Triage Complete: Triage complete  Chief Complaint SOB  Triage Note Pt complained of SOB that started 2 days ago, but worsen today. Per pt fluid was removed from left lung 2 weeks ago. Hx of lung cancer. Pt wears 3L oxygen PRN.    Allergies Allergies  Allergen Reactions  . Aspirin     Regular asa     Level of Care/Admitting Diagnosis ED Disposition    ED Disposition Condition [facility] Area: [place] [[id number]]  Level of Care: Telemetry Medical [104]  Covid Evaluation: Confirmed COVID Negative  Diagnosis: Recurrent left pleural effusion [[id number]]  Admitting Physician: Murlean Iba [[id number]]  Attending Physician: Murlean Iba [[id number]]  Estimated length of stay: past midnight tomorrow  Certification:: I certify this patient will need inpatient services for at least 2 midnights  PT Class (Do Not Modify): Inpatient [101]  PT Acc Code (Do Not Modify): Private [1]       B Medical/Surgery History Past Medical History:  Diagnosis Date  . Cancer ([place])    lung  . Chronic lower back pain   . Chronic pain    pain management  . Contact with stonefish as cause of accidental injury 1954   "stung across my left hand in [place]; LUE weaker since"  . COPD (chronic obstructive pulmonary disease) ([place])   . Coronary artery disease   . Dyspnea    with exertion  . High cholesterol   . History of blood transfusion 1943  . History of kidney stones    passed  . Hypertension   . Mass of left lung   . Migraine    "none since the 1990s" (09/11/2016)  . Myocardial infarction ([place]) 01/20/1993; 02/28/1993  . Neuromuscular disorder ([facility])    neuropathy left  . On home  O2   . Pneumonia    "several times" (09/11/2016)  . Tobacco abuse    ongoing   Past Surgical History:  Procedure Laterality Date  . APPENDECTOMY    . CARDIAC CATHETERIZATION    . CATARACT EXTRACTION, BILATERAL Bilateral   . COLONOSCOPY    . HIP ARTHROPLASTY Right 12/20/2016   Procedure: ARTHROPLASTY BIPOLAR HIP (HEMIARTHROPLASTY);  Surgeon: [name], MD;  Location: [employer] [facility];  Service: Orthopedics;  Laterality: Right;  . IR THORACENTESIS ASP PLEURAL SPACE W/IMG GUIDE  06/15/2018  . JOINT REPLACEMENT    . LEFT HEART CATH AND CORONARY ANGIOGRAPHY N/A 09/12/2016   Procedure: LEFT HEART CATH AND CORONARY ANGIOGRAPHY;  Surgeon: Burnell Blanks, MD;  Location: [facility];  Service: Cardiovascular;  Laterality: N/A;  . NASAL ENDOSCOPY WITH EPISTAXIS CONTROL Right 03/15/2016   Procedure: NASAL ENDOSCOPY WITH EPISTAXIS CONTROL;  Surgeon: Jodi Marble, MD;  Location: [facility];  Service: ENT;  Laterality: Right;  . PORTACATH PLACEMENT Right 04/23/2018   Procedure: INSERTION PORT-A-CATH;  Surgeon: Aviva Signs, MD;  Location: [facility];  Service: General;  Laterality: Right;  . SEPTOPLASTY Right 03/15/2016   Procedure: SEPTOPLASTY;  Surgeon: Jodi Marble, MD;  Location: [facility];  Service: ENT;  Laterality: Right;  . SINUS EXPLORATION Right 03/15/2016   Procedure: SINUS EXPLORATION;  Surgeon: Jodi Marble, MD;  Location: [facility];  Service: ENT;  Laterality: Right;  . TONSILLECTOMY    . TOOTH EXTRACTION    . TOTAL KNEE ARTHROPLASTY Left   . VIDEO BRONCHOSCOPY WITH ENDOBRONCHIAL ULTRASOUND N/A 12/15/2017   Procedure: VIDEO BRONCHOSCOPY WITH ENDOBRONCHIAL ULTRASOUND;  Surgeon: Rigoberto Noel, MD;  Location: [facility];  Service: Thoracic;  Laterality: N/A;     A IV Location/Drains/Wounds Patient Lines/Drains/Airways Status   Active Line/Drains/Airways    Name:   Placement date:   Placement time:   Site:   Days:   Peripheral IV 06/25/18 Left Antecubital   06/25/18    0930    Antecubital   less  than 1   Incision (Closed) 06/15/18 Back Left   06/15/18    1020     10          Intake/Output Last 24 hours No intake or output data in the 24 hours ending 06/25/18 1445  Labs/Imaging Results for orders placed or performed during the hospital encounter of 06/25/18 (from the past 48 hour(s))  Urinalysis, Routine w reflex microscopic     Status: Abnormal   Collection Time: 06/25/18  9:04 AM  Result Value Ref Range   Color, Urine AMBER (A) YELLOW    Comment: BIOCHEMICALS MAY BE AFFECTED BY COLOR   APPearance HAZY (A) CLEAR   Specific Gravity, Urine >1.030 (H) 1.005 - 1.030   pH 5.5 5.0 - 8.0   Glucose, UA NEGATIVE NEGATIVE mg/dL   Hgb urine dipstick NEGATIVE NEGATIVE   Bilirubin Urine NEGATIVE NEGATIVE   Ketones, ur TRACE (A) NEGATIVE mg/dL   Protein, ur NEGATIVE NEGATIVE mg/dL   Nitrite NEGATIVE NEGATIVE   Leukocytes,Ua NEGATIVE NEGATIVE    Comment: Microscopic not done on urines with negative protein, blood, leukocytes, nitrite, or glucose < 500 mg/dL. Performed at [facility], [address] [place], [place] [address]   Basic metabolic panel     Status: Abnormal   Collection Time: 06/25/18  9:15 AM  Result Value Ref Range   Sodium 140 135 - 145 mmol/L   Potassium 2.9 (L) 3.5 - 5.1 mmol/L   Chloride 114 (H) 98 - 111 mmol/L   CO2 18 (L) 22 - 32 mmol/L   Glucose, Bld 85 70 - 99 mg/dL   BUN 13 8 - 23 mg/dL   Creatinine, Ser 0.70 0.61 - 1.24 mg/dL   Calcium 6.7 (L) 8.9 - 10.3 mg/dL   GFR calc non Af Amer >60 >60 mL/min   GFR calc Af Amer >60 >60 mL/min   Anion gap 8 5 - 15    Comment: Performed at [facility], [address] [place], [place] [address]  Brain natriuretic peptide     Status: None   Collection Time: 06/25/18  9:15 AM  Result Value Ref Range   B Natriuretic Peptide 52.0 0.0 - 100.0 pg/mL    Comment: Performed at [facility], [address], [place], [place] [address]  Troponin I - Once     Status: None   Collection Time: 06/25/18  9:15 AM  Result Value  Ref Range   Troponin I <0.03 <0.03 ng/mL    Comment: Performed at [facility], [address], [place], [place] [address]  CBC with Differential     Status: Abnormal   Collection Time: 06/25/18  9:15 AM  Result Value Ref Range   WBC 12.0 (H) 4.0 - 10.5 K/uL   RBC 5.46 4.22 - 5.81 MIL/uL   Hemoglobin 15.5 13.0 - 17.0 g/dL   HCT 48.4 39.0 -  52.0 %   MCV 88.6 80.0 - 100.0 fL   MCH 28.4 26.0 - 34.0 pg   MCHC 32.0 30.0 - 36.0 g/dL   RDW 18.6 (H) 11.5 - 15.5 %   Platelets 580 (H) 150 - 400 K/uL   nRBC 0.0 0.0 - 0.2 %   Neutrophils Relative % 64 %   Neutro Abs 7.7 1.7 - 7.7 K/uL   Lymphocytes Relative 26 %   Lymphs Abs 3.1 0.7 - 4.0 K/uL   Monocytes Relative 5 %   Monocytes Absolute 0.6 0.1 - 1.0 K/uL   Eosinophils Relative 4 %   Eosinophils Absolute 0.5 0.0 - 0.5 K/uL   Basophils Relative 1 %   Basophils Absolute 0.1 0.0 - 0.1 K/uL   Immature Granulocytes 0 %   Abs Immature Granulocytes 0.05 0.00 - 0.07 K/uL    Comment: Performed at [facility], [address], [place], [place] [address]  SARS Coronavirus 2 (CEPHEID - Performed in [place] hospital lab), Hosp Order     Status: None   Collection Time: 06/25/18  9:16 AM  Result Value Ref Range   SARS Coronavirus 2 NEGATIVE NEGATIVE    Comment: (NOTE) If result is NEGATIVE SARS-CoV-2 target nucleic acids are NOT DETECTED. The SARS-CoV-2 RNA is generally detectable in upper and lower  respiratory specimens during the acute phase of infection. The lowest  concentration of SARS-CoV-2 viral copies this assay can detect is 250  copies / mL. A negative result does not preclude SARS-CoV-2 infection  and should not be used as the sole basis for treatment or other  patient management decisions.  A negative result may occur with  improper specimen collection / handling, submission of specimen other  than nasopharyngeal swab, presence of viral mutation(s) within the  areas targeted by this assay, and inadequate number of viral copies   (<250 copies / mL). A negative result must be combined with clinical  observations, patient history, and epidemiological information. If result is POSITIVE SARS-CoV-2 target nucleic acids are DETECTED. The SARS-CoV-2 RNA is generally detectable in upper and lower  respiratory specimens dur ing the acute phase of infection.  Positive  results are indicative of active infection with SARS-CoV-2.  Clinical  correlation with patient history and other diagnostic information is  necessary to determine patient infection status.  Positive results do  not rule out bacterial infection or co-infection with other viruses. If result is PRESUMPTIVE POSTIVE SARS-CoV-2 nucleic acids MAY BE PRESENT.   A presumptive positive result was obtained on the submitted specimen  and confirmed on repeat testing.  While 2019 novel coronavirus  (SARS-CoV-2) nucleic acids may be present in the submitted sample  additional confirmatory testing may be necessary for epidemiological  and / or clinical management purposes  to differentiate between  SARS-CoV-2 and other Sarbecovirus currently known to infect humans.  If clinically indicated additional testing with an alternate test  methodology [phone]) is advised. The SARS-CoV-2 RNA is generally  detectable in upper and lower respiratory sp ecimens during the acute  phase of infection. The expected result is Negative. Fact Sheet for Patients:  StrictlyIdeas.no Fact Sheet for Healthcare Providers: BankingDealers.co.za This test is not yet approved or cleared by the [place] FDA and has been authorized for detection and/or diagnosis of SARS-CoV-2 by FDA under an Emergency Use Authorization (EUA).  This EUA will remain in effect (meaning this test can be used) for the duration of the COVID-19 declaration under Section 564(b)(1) of the Act, 21 U.S.C.  section 360bbb-3(b)(1), unless the authorization is terminated  or revoked sooner. Performed at [facility], [address], [place], [place] [address]   Lactic acid, plasma     Status: None   Collection Time: 06/25/18  9:24 AM  Result Value Ref Range   Lactic Acid, Venous 1.0 0.5 - 1.9 mmol/L    Comment: Performed at [facility], [address] [place], [place] [address]  Lactic acid, plasma     Status: None   Collection Time: 06/25/18 11:08 AM  Result Value Ref Range   Lactic Acid, Venous 1.6 0.5 - 1.9 mmol/L    Comment: Performed at [facility], [address], [place], [place] [address]  Albumin     Status: Abnormal   Collection Time: 06/25/18  2:30 PM  Result Value Ref Range   Albumin 2.3 (L) 3.5 - 5.0 g/dL    Comment: Performed at [facility], [address], [place], [place] [address]   Ct Angio Chest Pe W/cm &/or Wo Cm  Result Date: 06/25/2018 CLINICAL DATA:  History of lung cancer. Status post left thoracentesis 2 weeks ago. Now with shortness of breath which began 2 days ago. EXAM: CT ANGIOGRAPHY CHEST WITH CONTRAST TECHNIQUE: Multidetector CT imaging of the chest was performed using the standard protocol during bolus administration of intravenous contrast. Multiplanar CT image reconstructions and MIPs were obtained to evaluate the vascular anatomy. CONTRAST:  32mL OMNIPAQUE IOHEXOL 350 MG/ML SOLN COMPARISON:  06/13/2018 FINDINGS: Cardiovascular: Normal heart size. There is no pericardial effusion identified. Aortic atherosclerosis. The main pulmonary artery appears patent. No saddle embolus. No central obstructing pulmonary emboli identified. No lobar or segmental pulmonary artery filling defects identified. Mediastinum/Nodes: Normal appearance of the thyroid gland. The trachea appears patent and is midline. Normal appearance of the esophagus. No axillary or supraclavicular adenopathy. Hilar and mediastinal adenopathy is again identified and is unchanged from 06/13/18. -right paratracheal lymph node measures 1.4 cm, image 42/4. Index right hilar  lymph node is unchanged at 2.0 cm, image 51/4. -subcarinal lymph node measures 1.3 cm, image 57/4. -left hilar lymph node is unchanged measuring 1.9 cm. Lungs/Pleura: Moderate volume loculated left pleural effusion is again identified overlying the left lung. Similar in volume to previous exam. Advanced centrilobular and paraseptal emphysema. There is a large area of masslike architectural distortion within the perihilar left lung encasing the left hilar structures measuring 8.9 cm, image 139/5. Similar to previous exam. Additionally extensive airspace densities and fibrosis extends into the left upper lobe and left apex. Findings are favored to represent a combination of progressive tumor, radiation change, and potentially postobstructive pneumonitis. The right lung is clear. Upper Abdomen: Previously noted hypermetabolic tumor within the posteromedial right lobe of liver is again noted measuring 2.9 cm, image 92/4. There is a subcapsular low-attenuation mass overlying segment 6/5 measuring 4.1 cm. New from 02/15/2018 PET-CT. Gallstones. Musculoskeletal: No chest wall abnormality. No acute or significant osseous findings. Unchanged T11 compression deformity. Review of the MIP images confirms the above findings. IMPRESSION: 1. No evidence for acute pulmonary embolus. 2. Interval reaccumulation of loculated left pleural effusion status post thoracentesis on 06/15/2018. 3. Persistent masslike architectural distortion and airspace consolidation within the left lung which may reflect progressive tumor and/or postobstructive pneumonitis or pneumonia. 4. No change in bilateral hilar and mediastinal adenopathy. 5. Similar appearance of liver metastasis from 06/13/2018. 6. Aortic Atherosclerosis (ICD10-I70.0) and Emphysema (ICD10-J43.9). Electronically Signed   By: Kerby Moors M.D.   On: 06/25/2018 12:02   Dg Chest Port 1  View  Result Date: 06/25/2018 CLINICAL DATA:  Shortness of breath. History of small cell lung  cancer. EXAM: PORTABLE CHEST 1 VIEW COMPARISON:  Chest x-ray dated Jun 22, 2018. FINDINGS: Unchanged right chest wall port catheter. Normal heart size. Severe emphysema and extensive pulmonary scarring again noted. Unchanged loculated left pleural effusion with left upper greater than lower lobe airspace disease. The right lung is clear. No pneumothorax. No acute osseous abnormality. IMPRESSION: 1. Unchanged extensive left lung airspace disease and loculated pleural effusion superimposed on severe emphysema. Electronically Signed   By: Titus Dubin M.D.   On: 06/25/2018 09:36    Pending Labs Unresulted Labs (From admission, onward)    Start     Ordered   06/25/18 0904  Urine culture  ONCE - STAT,   STAT     06/25/18 0903   Signed and Held  CBC  (heparin)  Once,   R    Comments:  Baseline for heparin therapy IF NOT ALREADY DRAWN.  Notify MD if PLT < 100 K.    Signed and Held   Signed and Held  Creatinine, serum  (heparin)  Once,   R    Comments:  Baseline for heparin therapy IF NOT ALREADY DRAWN.    Signed and Held   Signed and Held  Comprehensive metabolic panel  Daily,   R     Signed and Held   Signed and Held  Magnesium  Daily,   R     Signed and Held   Signed and Held  CBC WITH DIFFERENTIAL  Daily,   R     Signed and Held          Vitals/Pain Today's Vitals   06/25/18 1230 06/25/18 1300 06/25/18 1400 06/25/18 1437  BP: (!) 127/44 138/74 (!) 145/75   Pulse: 77 78 76   Resp: (!) 23     Temp:      TempSrc:      SpO2: 99% 100% 98% 100%  Weight:      Height:      PainSc:        Isolation Precautions No active isolations  Medications Medications  ceFEPIme (MAXIPIME) 2 g in sodium chloride 0.9 % 100 mL IVPB (2 g Intravenous New Bag/Given 06/25/18 1354)  vancomycin (VANCOCIN) 1,500 mg in sodium chloride 0.9 % 500 mL IVPB (1,500 mg Intravenous New Bag/Given 06/25/18 1434)    Followed by  vancomycin (VANCOCIN) IVPB 750 mg/150 ml premix (has no administration in time range)   albuterol (VENTOLIN HFA) 108 (90 Base) MCG/ACT inhaler 8 puff (8 puffs Inhalation Given 06/25/18 1049)  iohexol (OMNIPAQUE) 350 MG/ML injection 75 mL (75 mLs Intravenous Contrast Given 06/25/18 1136)  methylPREDNISolone sodium succinate (SOLU-MEDROL) 125 mg/2 mL injection 125 mg (125 mg Intravenous Given 06/25/18 1349)  albuterol (PROVENTIL,VENTOLIN) solution continuous neb (10 mg/hr Nebulization Given 06/25/18 1436)  ipratropium (ATROVENT) nebulizer solution 1 mg (1 mg Nebulization Given 06/25/18 1437)  potassium chloride SA (K-DUR) CR tablet 40 mEq (40 mEq Oral Given 06/25/18 1349)  walks with person assist  Mobility  Moderate fall risk   Focused Assessments Pulmonary Assessment Handoff:  Lung sounds: Bilateral Breath Sounds: Diminished O2 Device: Nasal Cannula O2 Flow Rate (L/min): 3 L/min      R Recommendations: See Admitting Provider Note  Report given to:   Additional Notes:  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 Patient ambulatory but short of breath to the point of feeling light headed when doing so.

## 2018-06-25 NOTE — Progress Notes (Signed)
Pharmacy Antibiotic Note  Bradley [name] is a 78 y.o. male admitted on 06/25/2018 with pneumonia/HCAP.  Pharmacy has been consulted for Vancomycin dosing.  Plan: Vancomycin 1500mg  IV loading dose, then 750mg  IV every 12 hours.  Goal trough 15-20 mcg/mL.  Cefepime 2gm IV q8h F/U cxs and clinical progress Monitor V/S, labs and levels as indicated  Height: 6' (182.9 cm) Weight: 145 lb (65.8 kg) IBW/kg (Calculated) : 77.6  Temp (24hrs), Avg:97.5 F (36.4 C), Min:97.5 F (36.4 C), Max:97.5 F (36.4 C)  Recent Labs  Lab 06/25/18 0915 06/25/18 0924 06/25/18 1108  WBC 12.0*  --   --   CREATININE 0.70  --   --   LATICACIDVEN  --  1.0 1.6    Estimated Creatinine Clearance: 72 mL/min (by C-G formula based on SCr of 0.7 mg/dL).    Allergies  Allergen Reactions  . Aspirin     Regular asa     Antimicrobials this admission: Vancomycin 5/29 >> Cefepime 5/29 >>  Dose adjustments this admission: Dose adjusted for HCAP on cefepime 5/29  Microbiology results: 5/29 BCx: pending 5/29 UCx: pending  MRSA PCR:  Thank you for allowing pharmacy to be a part of this patient's care.  Isac Sarna, BS Pharm D, BCPS Clinical Pharmacist Pager [phone] 06/25/2018 1:25 PM

## 2018-06-25 NOTE — ED Notes (Signed)
Tammy, niece, picked up pt's knife and SS card.

## 2018-06-26 ENCOUNTER — Inpatient Hospital Stay (HOSPITAL_COMMUNITY): Payer: Medicare Other

## 2018-06-26 LAB — CBC WITH DIFFERENTIAL/PLATELET
Abs Immature Granulocytes: 0.06 10*3/uL (ref 0.00–0.07)
Basophils Absolute: 0 10*3/uL (ref 0.0–0.1)
Basophils Relative: 0 %
Eosinophils Absolute: 0 10*3/uL (ref 0.0–0.5)
Eosinophils Relative: 0 %
HCT: 40.7 % (ref 39.0–52.0)
Hemoglobin: 13.4 g/dL (ref 13.0–17.0)
Immature Granulocytes: 0 %
Lymphocytes Relative: 15 %
Lymphs Abs: 2 10*3/uL (ref 0.7–4.0)
MCH: 28.8 pg (ref 26.0–34.0)
MCHC: 32.9 g/dL (ref 30.0–36.0)
MCV: 87.5 fL (ref 80.0–100.0)
Monocytes Absolute: 0.2 10*3/uL (ref 0.1–1.0)
Monocytes Relative: 2 %
Neutro Abs: 11.3 10*3/uL — ABNORMAL HIGH (ref 1.7–7.7)
Neutrophils Relative %: 83 %
Platelets: 532 10*3/uL — ABNORMAL HIGH (ref 150–400)
RBC: 4.65 MIL/uL (ref 4.22–5.81)
RDW: 17.3 % — ABNORMAL HIGH (ref 11.5–15.5)
WBC: 13.6 10*3/uL — ABNORMAL HIGH (ref 4.0–10.5)
nRBC: 0 % (ref 0.0–0.2)

## 2018-06-26 LAB — PROTEIN, PLEURAL OR PERITONEAL FLUID: Total protein, fluid: 6.1 g/dL

## 2018-06-26 LAB — BODY FLUID CELL COUNT WITH DIFFERENTIAL
Eos, Fluid: 1 %
Lymphs, Fluid: 83 %
Monocyte-Macrophage-Serous Fluid: 9 % — ABNORMAL LOW (ref 50–90)
Neutrophil Count, Fluid: 7 % (ref 0–25)
Total Nucleated Cell Count, Fluid: 675 cu mm (ref 0–1000)

## 2018-06-26 LAB — URINE CULTURE: Culture: NO GROWTH

## 2018-06-26 LAB — COMPREHENSIVE METABOLIC PANEL
ALT: 14 U/L (ref 0–44)
AST: 18 U/L (ref 15–41)
Albumin: 2.6 g/dL — ABNORMAL LOW (ref 3.5–5.0)
Alkaline Phosphatase: 40 U/L (ref 38–126)
Anion gap: 11 (ref 5–15)
BUN: 18 mg/dL (ref 8–23)
CO2: 22 mmol/L (ref 22–32)
Calcium: 9.4 mg/dL (ref 8.9–10.3)
Chloride: 102 mmol/L (ref 98–111)
Creatinine, Ser: 1 mg/dL (ref 0.61–1.24)
GFR calc Af Amer: >60 mL/min (ref >60)
GFR calc non Af Amer: >60 mL/min (ref >60)
Glucose, Bld: 168 mg/dL — ABNORMAL HIGH (ref 70–99)
Potassium: 4.2 mmol/L (ref 3.5–5.1)
Sodium: 135 mmol/L (ref 135–145)
Total Bilirubin: 0.9 mg/dL (ref 0.3–1.2)
Total Protein: 8.5 g/dL — ABNORMAL HIGH (ref 6.5–8.1)

## 2018-06-26 LAB — AMYLASE, PLEURAL OR PERITONEAL FLUID: Amylase, Fluid: 119 U/L

## 2018-06-26 LAB — GLUCOSE, PLEURAL OR PERITONEAL FLUID: Glucose, Fluid: 159 mg/dL

## 2018-06-26 LAB — GRAM STAIN

## 2018-06-26 LAB — LACTATE DEHYDROGENASE, PLEURAL OR PERITONEAL FLUID: LD, Fluid: 143 U/L — ABNORMAL HIGH (ref 3–23)

## 2018-06-26 LAB — MAGNESIUM: Magnesium: 1.8 mg/dL (ref 1.7–2.4)

## 2018-06-26 MED ORDER — LIDOCAINE HCL (PF) 1 % IJ SOLN
INTRAMUSCULAR | Status: AC
  Filled 2018-06-26: qty 5

## 2018-06-26 NOTE — Procedures (Signed)
PROCEDURE SUMMARY:  Successful US guided left thoracentesis. Yielded 900 mL of clear yellow fluid. Patient tolerated procedure well. No immediate complications. EBL = trace  Specimen was sent for labs.  Post procedure chest X-ray reveals no pneumothorax  Braxley Balandran S Jaiyden Laur PA-C 06/26/2018 10:03 AM

## 2018-06-26 NOTE — Progress Notes (Signed)
PROGRESS NOTE    Bradley [name]  VZD:[id number] DOB: 05-29-1940 DOA: 06/25/2018 PCP: Jani Gravel, MD   Brief Narrative:   Bradley [name] is a 78 y.o. 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  He had his thoracentesis with negative cytology.  1.5 L was removed.  He was followed by the CT surgery service.  They had planned to see him in the office later next week.  He was discharged on oral antibiotics which he reports taking.  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  He was recently seen by his oncologist Dr. Delton Coombes and patient decided he wanted to continue immunotherapy versus hospice.  The patient reports increasing shortness of breath and left-sided chest wall pain over the past 2 days.  He does wear oxygen 3 L as needed at home.  He has severe COPD.  He has diminished exercise tolerance.  He has been desaturating with ambulation.  He denies fever and chills.  Has a chronic cough.  He denies back pain.   Assessment & Plan:   Principal Problem:   Recurrent left pleural effusion Active Problems:   Tobacco abuse   Emphysema, unspecified (HCC)   CAD (coronary artery disease), native coronary artery   Essential hypertension   Leukocytosis   Abnormal chest x-ray   Hypokalemia   Non-small cell carcinoma of left lung (HCC)   Postobstructive pneumonia   On home O2   Acute on chronic respiratory failure (HCC)   Acute on chronic respiratory failure secondary to pneumonia and recurrent loculated left pleural effusion     - the patient had good relief from the recent thoracentesis done on 06/15/2018 with 1.5 L removed at that time.     - CTS consulted, appreciate assistance; L pleurx catheter for Monday  Postobstructive pneumonia-with recent hospitalization     -  Continue cefepime/vancomycin.       - Continue supportive therapy.  Severe COPD     - dulera  Tobacco abuse     - patient has been smoking heavily since discharge.       - He is unwilling to stop at this time.  Metastatic non-small cell lung carcinoma     - patient recently saw his oncologist Dr. Delton Coombes and wants to pursue ongoing immunotherapy and plans to have a port placed next week.  Essential hypertension     - amlodipine, metoprolol  Hypokalemia     - resolved  CAD     - ASA, statin, metoprolol   DVT prophylaxis: Heparin Code Status: DNR   Disposition Plan: TBD   Consultants:   CTS  Procedures:   Thoracentesis  Antimicrobials:  Deniece Ree. cefepime    Subjective: "They just did."  Objective: Vitals:   06/26/18 0916 06/26/18 0923 06/26/18 0926 06/26/18 1422  BP: 138/74 137/70 135/69 134/71  Pulse:    86  Resp:    18  Temp:    97.8 F (36.6 C)  TempSrc:    Oral  SpO2: 100% 100% 100% 95%  Weight:      Height:        Intake/Output Summary (Last 24 hours) at 06/26/2018 1449 Last data filed at 06/26/2018 1300 Gross per 24 hour  Intake 680.17 ml  Output 800 ml  Net -119.83 ml   Filed Weights   06/25/18  2878 06/26/18 0439  Weight: 65.8 kg 67.7 kg    Examination:  General exam: 78 y.o. male Appears calm and comfortable  Respiratory system: Clear to auscultation anteriorly. Respiratory effort normal. Cardiovascular system: S1 & S2 heard, RRR. 1/6 SEM No JVD, rubs, gallops or clicks. No pedal edema. Gastrointestinal system: Abdomen is nondistended, soft and nontender. No organomegaly or masses felt. Normal bowel sounds heard. Central nervous system: Alert and oriented. No focal neurological deficits. Extremities: Symmetric 5 x 5 power. Skin: No rashes, lesions or ulcers    Data Reviewed: I have personally reviewed following labs and imaging studies.  CBC: Recent Labs  Lab 06/25/18 0915 06/25/18 1632 06/26/18 0518  WBC 12.0* 11.6* 13.6*   NEUTROABS 7.7  --  11.3*  HGB 15.5 14.6 13.4  HCT 48.4 45.1 40.7  MCV 88.6 88.6 87.5  PLT 580* 575* 676*   Basic Metabolic Panel: Recent Labs  Lab 06/25/18 0915 06/25/18 1632 06/26/18 0518  NA 140  --  135  K 2.9*  --  4.2  CL 114*  --  102  CO2 18*  --  22  GLUCOSE 85  --  168*  BUN 13  --  18  CREATININE 0.70 1.19 1.00  CALCIUM 6.7*  --  9.4  MG  --   --  1.8   GFR: Estimated Creatinine Clearance: 59.2 mL/min (by C-G formula based on SCr of 1 mg/dL). Liver Function Tests: Recent Labs  Lab 06/25/18 1430 06/26/18 0518  AST  --  18  ALT  --  14  ALKPHOS  --  40  BILITOT  --  0.9  PROT  --  8.5*  ALBUMIN 2.3* 2.6*   No results for input(s): LIPASE, AMYLASE in the last 168 hours. No results for input(s): AMMONIA in the last 168 hours. Coagulation Profile: No results for input(s): INR, PROTIME in the last 168 hours. Cardiac Enzymes: Recent Labs  Lab 06/25/18 0915  TROPONINI <0.03   BNP (last 3 results) No results for input(s): PROBNP in the last 8760 hours. HbA1C: No results for input(s): HGBA1C in the last 72 hours. CBG: No results for input(s): GLUCAP in the last 168 hours. Lipid Profile: No results for input(s): CHOL, HDL, LDLCALC, TRIG, CHOLHDL, LDLDIRECT in the last 72 hours. Thyroid Function Tests: No results for input(s): TSH, T4TOTAL, FREET4, T3FREE, THYROIDAB in the last 72 hours. Anemia Panel: No results for input(s): VITAMINB12, FOLATE, FERRITIN, TIBC, IRON, RETICCTPCT in the last 72 hours. Sepsis Labs: Recent Labs  Lab 06/25/18 7209 06/25/18 1108  LATICACIDVEN 1.0 1.6    Recent Results (from the past 240 hour(s))  Urine culture     Status: None   Collection Time: 06/25/18  9:04 AM  Result Value Ref Range Status   Specimen Description   Final    URINE, CLEAN CATCH Performed at [facility], [address], [place], [place] [address]    Special Requests   Final    NONE Performed at [facility], [address] [place], [place]  [address]    Culture   Final    NO GROWTH Performed at [facility], [place] [address] [place], [place] [address]    Report Status 06/26/2018 FINAL  Final  SARS Coronavirus 2 (CEPHEID - Performed in [place] hospital lab), Hosp Order     Status: None   Collection Time: 06/25/18  9:16 AM  Result Value Ref Range Status   SARS Coronavirus 2 NEGATIVE NEGATIVE Final    Comment: (  NOTE) If result is NEGATIVE SARS-CoV-2 target nucleic acids are NOT DETECTED. The SARS-CoV-2 RNA is generally detectable in upper and lower  respiratory specimens during the acute phase of infection. The lowest  concentration of SARS-CoV-2 viral copies this assay can detect is 250  copies / mL. A negative result does not preclude SARS-CoV-2 infection  and should not be used as the sole basis for treatment or other  patient management decisions.  A negative result may occur with  improper specimen collection / handling, submission of specimen other  than nasopharyngeal swab, presence of viral mutation(s) within the  areas targeted by this assay, and inadequate number of viral copies  (<250 copies / mL). A negative result must be combined with clinical  observations, patient history, and epidemiological information. If result is POSITIVE SARS-CoV-2 target nucleic acids are DETECTED. The SARS-CoV-2 RNA is generally detectable in upper and lower  respiratory specimens dur ing the acute phase of infection.  Positive  results are indicative of active infection with SARS-CoV-2.  Clinical  correlation with patient history and other diagnostic information is  necessary to determine patient infection status.  Positive results do  not rule out bacterial infection or co-infection with other viruses. If result is PRESUMPTIVE POSTIVE SARS-CoV-2 nucleic acids MAY BE PRESENT.   A presumptive positive result was obtained on the submitted specimen  and confirmed on repeat testing.  While 2019 novel coronavirus  (SARS-CoV-2)  nucleic acids may be present in the submitted sample  additional confirmatory testing may be necessary for epidemiological  and / or clinical management purposes  to differentiate between  SARS-CoV-2 and other Sarbecovirus currently known to infect humans.  If clinically indicated additional testing with an alternate test  methodology [phone]) is advised. The SARS-CoV-2 RNA is generally  detectable in upper and lower respiratory sp ecimens during the acute  phase of infection. The expected result is Negative. Fact Sheet for Patients:  StrictlyIdeas.no Fact Sheet for Healthcare Providers: BankingDealers.co.za This test is not yet approved or cleared by the [place] FDA and has been authorized for detection and/or diagnosis of SARS-CoV-2 by FDA under an Emergency Use Authorization (EUA).  This EUA will remain in effect (meaning this test can be used) for the duration of the COVID-19 declaration under Section 564(b)(1) of the Act, 21 U.S.C. section 360bbb-3(b)(1), unless the authorization is terminated or revoked sooner. Performed at [facility], [address], [place], [place] [address]          Radiology Studies: Dg Chest 1 View  Result Date: 06/26/2018 CLINICAL DATA:  Post left thoracentesis EXAM: CHEST  1 VIEW COMPARISON:  06/25/2018 FINDINGS: The left pleural effusion has improved and there is no evidence of pneumothorax. Normal heart size. Stable right subclavian Port-A-Cath. Opacities in the left lung improved. Stable appearance of the right lung with chronic parenchymal changes. IMPRESSION: No pneumothorax post left thoracentesis. Electronically Signed   By: Marybelle Killings M.D.   On: 06/26/2018 09:53   Ct Angio Chest Pe W/cm &/or Wo Cm  Result Date: 06/25/2018 CLINICAL DATA:  History of lung cancer. Status post left thoracentesis 2 weeks ago. Now with shortness of breath which began 2 days ago. EXAM: CT ANGIOGRAPHY CHEST WITH  CONTRAST TECHNIQUE: Multidetector CT imaging of the chest was performed using the standard protocol during bolus administration of intravenous contrast. Multiplanar CT image reconstructions and MIPs were obtained to evaluate the vascular anatomy. CONTRAST:  23mL OMNIPAQUE IOHEXOL 350 MG/ML SOLN COMPARISON:  06/13/2018 FINDINGS: Cardiovascular: Normal heart  size. There is no pericardial effusion identified. Aortic atherosclerosis. The main pulmonary artery appears patent. No saddle embolus. No central obstructing pulmonary emboli identified. No lobar or segmental pulmonary artery filling defects identified. Mediastinum/Nodes: Normal appearance of the thyroid gland. The trachea appears patent and is midline. Normal appearance of the esophagus. No axillary or supraclavicular adenopathy. Hilar and mediastinal adenopathy is again identified and is unchanged from 06/13/18. -right paratracheal lymph node measures 1.4 cm, image 42/4. Index right hilar lymph node is unchanged at 2.0 cm, image 51/4. -subcarinal lymph node measures 1.3 cm, image 57/4. -left hilar lymph node is unchanged measuring 1.9 cm. Lungs/Pleura: Moderate volume loculated left pleural effusion is again identified overlying the left lung. Similar in volume to previous exam. Advanced centrilobular and paraseptal emphysema. There is a large area of masslike architectural distortion within the perihilar left lung encasing the left hilar structures measuring 8.9 cm, image 139/5. Similar to previous exam. Additionally extensive airspace densities and fibrosis extends into the left upper lobe and left apex. Findings are favored to represent a combination of progressive tumor, radiation change, and potentially postobstructive pneumonitis. The right lung is clear. Upper Abdomen: Previously noted hypermetabolic tumor within the posteromedial right lobe of liver is again noted measuring 2.9 cm, image 92/4. There is a subcapsular low-attenuation mass overlying segment  6/5 measuring 4.1 cm. New from 02/15/2018 PET-CT. Gallstones. Musculoskeletal: No chest wall abnormality. No acute or significant osseous findings. Unchanged T11 compression deformity. Review of the MIP images confirms the above findings. IMPRESSION: 1. No evidence for acute pulmonary embolus. 2. Interval reaccumulation of loculated left pleural effusion status post thoracentesis on 06/15/2018. 3. Persistent masslike architectural distortion and airspace consolidation within the left lung which may reflect progressive tumor and/or postobstructive pneumonitis or pneumonia. 4. No change in bilateral hilar and mediastinal adenopathy. 5. Similar appearance of liver metastasis from 06/13/2018. 6. Aortic Atherosclerosis (ICD10-I70.0) and Emphysema (ICD10-J43.9). Electronically Signed   By: Kerby Moors M.D.   On: 06/25/2018 12:02   Dg Chest Port 1 View  Result Date: 06/25/2018 CLINICAL DATA:  Shortness of breath. History of small cell lung cancer. EXAM: PORTABLE CHEST 1 VIEW COMPARISON:  Chest x-ray dated Jun 22, 2018. FINDINGS: Unchanged right chest wall port catheter. Normal heart size. Severe emphysema and extensive pulmonary scarring again noted. Unchanged loculated left pleural effusion with left upper greater than lower lobe airspace disease. The right lung is clear. No pneumothorax. No acute osseous abnormality. IMPRESSION: 1. Unchanged extensive left lung airspace disease and loculated pleural effusion superimposed on severe emphysema. Electronically Signed   By: Titus Dubin M.D.   On: 06/25/2018 09:36   US Thoracentesis Asp Pleural Space W/img Guide  Result Date: 06/26/2018 INDICATION: Static left lung cancer. Left pleural effusion. Request for diagnostic and therapeutic thoracentesis. EXAM: ULTRASOUND GUIDED LEFT THORACENTESIS MEDICATIONS: 1% lidocaine 15 mL COMPLICATIONS: None immediate. PROCEDURE: An ultrasound guided thoracentesis was thoroughly discussed with the patient and questions answered.  The benefits, risks, alternatives and complications were also discussed. The patient understands and wishes to proceed with the procedure. Written consent was obtained. Ultrasound was performed to localize and mark an adequate pocket of fluid in the left chest. The area was then prepped and draped in the normal sterile fashion. 1% Lidocaine was used for local anesthesia. Under ultrasound guidance a 6 Fr Safe-T-Centesis catheter was introduced. Thoracentesis was performed. The catheter was removed and a dressing applied. FINDINGS: A total of approximately 900 mL of clear yellow fluid was removed. Samples were sent to  the laboratory as requested by the clinical team. IMPRESSION: Successful ultrasound guided left thoracentesis yielding 900 mL of pleural fluid. No pneumothorax on post-procedure chest x-ray. Read by: Gareth Eagle, PA-C Electronically Signed   By: Marybelle Killings M.D.   On: 06/26/2018 10:02        Scheduled Meds: . amLODipine  5 mg Oral Daily  . aspirin EC  81 mg Oral Daily  . ferrous sulfate  325 mg Oral Q breakfast  . fluticasone  2 spray Each Nare Daily  . gabapentin  300 mg Oral TID  . guaiFENesin  1,200 mg Oral BID  . heparin  5,000 Units Subcutaneous Q8H  . ipratropium-albuterol  3 mL Nebulization TID  . lidocaine (PF)      . lidocaine (PF)      . lidocaine (PF)      . metoCLOPramide  5 mg Oral TID AC & HS  . metoprolol succinate  25 mg Oral Daily  . mirabegron ER  50 mg Oral QHS  . mometasone-formoterol  2 puff Inhalation BID  . montelukast  10 mg Oral Daily  . multivitamin with minerals  1 tablet Oral Daily  . pantoprazole  40 mg Oral Daily  . senna  1 tablet Oral Daily  . simvastatin  10 mg Oral QHS  . sodium chloride flush  3 mL Intravenous Q12H  . vitamin C  500 mg Oral Daily   Continuous Infusions: . sodium chloride    . ceFEPime (MAXIPIME) IV 2 g (06/26/18 1318)  . vancomycin 750 mg (06/26/18 0209)     LOS: 1 day    Time spent: 25 minutes spent in the  coordination of care today.     Jonnie Finner, DO [facility] Pager [phone]  If 7PM-7AM, please contact night-coverage [email or web address] Password [email or web address] 06/26/2018, 2:49 PM

## 2018-06-27 LAB — CBC WITH DIFFERENTIAL/PLATELET
Abs Immature Granulocytes: 0.08 10*3/uL — ABNORMAL HIGH (ref 0.00–0.07)
Basophils Absolute: 0.1 10*3/uL (ref 0.0–0.1)
Basophils Relative: 1 %
Eosinophils Absolute: 0.2 10*3/uL (ref 0.0–0.5)
Eosinophils Relative: 1 %
HCT: 41.9 % (ref 39.0–52.0)
Hemoglobin: 13.5 g/dL (ref 13.0–17.0)
Immature Granulocytes: 0 %
Lymphocytes Relative: 16 %
Lymphs Abs: 2.9 10*3/uL (ref 0.7–4.0)
MCH: 29.1 pg (ref 26.0–34.0)
MCHC: 32.2 g/dL (ref 30.0–36.0)
MCV: 90.3 fL (ref 80.0–100.0)
Monocytes Absolute: 1 10*3/uL (ref 0.1–1.0)
Monocytes Relative: 5 %
Neutro Abs: 14.1 10*3/uL — ABNORMAL HIGH (ref 1.7–7.7)
Neutrophils Relative %: 77 %
Platelets: 531 10*3/uL — ABNORMAL HIGH (ref 150–400)
RBC: 4.64 MIL/uL (ref 4.22–5.81)
RDW: 18 % — ABNORMAL HIGH (ref 11.5–15.5)
WBC: 18.4 10*3/uL — ABNORMAL HIGH (ref 4.0–10.5)
nRBC: 0 % (ref 0.0–0.2)

## 2018-06-27 LAB — COMPREHENSIVE METABOLIC PANEL
ALT: 14 U/L (ref 0–44)
AST: 15 U/L (ref 15–41)
Albumin: 2.5 g/dL — ABNORMAL LOW (ref 3.5–5.0)
Alkaline Phosphatase: 46 U/L (ref 38–126)
Anion gap: 9 (ref 5–15)
BUN: 27 mg/dL — ABNORMAL HIGH (ref 8–23)
CO2: 23 mmol/L (ref 22–32)
Calcium: 8.8 mg/dL — ABNORMAL LOW (ref 8.9–10.3)
Chloride: 103 mmol/L (ref 98–111)
Creatinine, Ser: 1.06 mg/dL (ref 0.61–1.24)
GFR calc Af Amer: >60 mL/min (ref >60)
GFR calc non Af Amer: >60 mL/min (ref >60)
Glucose, Bld: 102 mg/dL — ABNORMAL HIGH (ref 70–99)
Potassium: 4.5 mmol/L (ref 3.5–5.1)
Sodium: 135 mmol/L (ref 135–145)
Total Bilirubin: 0.8 mg/dL (ref 0.3–1.2)
Total Protein: 7.8 g/dL (ref 6.5–8.1)

## 2018-06-27 LAB — MAGNESIUM: Magnesium: 1.9 mg/dL (ref 1.7–2.4)

## 2018-06-27 LAB — GLUCOSE, CAPILLARY: Glucose-Capillary: 84 mg/dL (ref 70–99)

## 2018-06-27 LAB — PHOSPHORUS: Phosphorus: 2.7 mg/dL (ref 2.5–4.6)

## 2018-06-27 MED ORDER — OXYCODONE HCL 5 MG PO TABS
10.0000 mg | ORAL_TABLET | Freq: Four times a day (QID) | ORAL | Status: DC | PRN
  Administered 2018-06-27 – 2018-06-30 (×6): 10 mg via ORAL
  Filled 2018-06-27 (×6): qty 2

## 2018-06-27 MED ORDER — LOPERAMIDE HCL 2 MG PO CAPS
4.0000 mg | ORAL_CAPSULE | Freq: Once | ORAL | Status: AC
  Administered 2018-06-27: 4 mg via ORAL
  Filled 2018-06-27: qty 2

## 2018-06-27 MED ORDER — MORPHINE SULFATE (PF) 2 MG/ML IV SOLN
2.0000 mg | INTRAVENOUS | Status: DC | PRN
  Administered 2018-06-27 – 2018-06-28 (×4): 2 mg via INTRAVENOUS
  Filled 2018-06-27 (×4): qty 1

## 2018-06-27 NOTE — Progress Notes (Signed)
Tele called this RN, pt had HR of 37-39, during this time pt was having bowel movement where in pt is straining. Pulse rate back to NSR. Will monitor

## 2018-06-27 NOTE — Progress Notes (Signed)
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 He had his thoracentesis with negative cytology. 1.5 L was removed. He was followed by the CT surgeryservice. They had planned to see him in the office later next week. He was discharged on oral antibiotics which he reports taking. 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 He was recently seen by his oncologist Dr. Delton Coombes and patient decided he wanted to continue immunotherapy versus hospice. The patient reports increasing shortness of breath and left-sided chest wall pain over the past 2 days. He does wear oxygen 3 L as needed at home. He has severe COPD. He has diminished exercise tolerance. He has been desaturating with ambulation. He denies fever and chills. Has a chronic cough. He denies back pain.   Assessment & Plan:   Principal Problem:   Recurrent left pleural effusion Active Problems:   Tobacco abuse   Emphysema, unspecified (HCC)   CAD (coronary artery disease), native coronary artery   Essential hypertension   Leukocytosis   Abnormal chest x-ray   Hypokalemia   Non-small cell carcinoma of left lung (HCC)   Postobstructive pneumonia   On home O2   Acute on chronic respiratory failure (HCC)   Acute on chronic respiratory failure secondary to pneumonia and recurrent loculated left pleural effusion     - the patient had good relief from the recent thoracentesis done on 06/15/2018 with 1.5 L removed at that time.     - CTS consulted, appreciate assistance; L pleurx catheter for Monday  Postobstructive pneumonia-with recent hospitalization     -  Continue cefepime/vancomycin.       - Continue supportive therapy.  Severe COPD     - dulera  Tobacco abuse     - patient has been smoking heavily since discharge.       - He is unwilling to stop at this time.  Metastatic non-small cell lung carcinoma     - patient recently saw his oncologist Dr. Delton Coombes and wants to pursue ongoing immunotherapy and plans to have a port placed next week.  Essential hypertension     - amlodipine, metoprolol  Hypokalemia     - resolved  CAD     - ASA, statin, metoprolol  C/o increase pain. Resume home dose pain management. For pleurx tomorrow. Continue as above.   DVT prophylaxis: Heparin Code Status: DNR   Disposition Plan: TBD   Consultants:   CTS  Procedures:   Thoracentesis  Antimicrobials:  . Cefepime/vanc    Subjective: "I've been on oxycodone since 1983."  Objective: Vitals:   06/27/18 0559 06/27/18 0850 06/27/18 1012 06/27/18 1156  BP: 131/76  119/65 114/75  Pulse: 80  79 80  Resp:    18  Temp: (!) 97.5 F (36.4 C)   98 F (36.7 C)  TempSrc: Oral   Oral  SpO2: 93% 95% 95% 96%  Weight: 67.5 kg     Height:        Intake/Output Summary (Last 24 hours) at 06/27/2018 1442 Last data filed at 06/27/2018 1300 Gross per 24 hour  Intake 533 ml  Output 1195 ml  Net -662 ml  Filed Weights   06/25/18 0847 06/26/18 0439 06/27/18 0559  Weight: 65.8 kg 67.7 kg 67.5 kg    Examination:  General exam: 78 y.o. male Appears calm and comfortable  Respiratory system: Clear to auscultation anteriorly. Respiratory effort normal. Cardiovascular system: S1 & S2 heard, RRR. 1/6 SEM No JVD, rubs, gallops or clicks. No pedal edema. Gastrointestinal system: Abdomen is nondistended, soft and nontender. No organomegaly or masses felt. Normal bowel sounds heard. Central nervous system: Alert and oriented. No focal neurological deficits. Extremities: Symmetric 5 x 5 power. Skin: No rashes, lesions or ulcers    Data  Reviewed: I have personally reviewed following labs and imaging studies.  CBC: Recent Labs  Lab 06/25/18 0915 06/25/18 1632 06/26/18 0518 06/27/18 0729  WBC 12.0* 11.6* 13.6* 18.4*  NEUTROABS 7.7  --  11.3* 14.1*  HGB 15.5 14.6 13.4 13.5  HCT 48.4 45.1 40.7 41.9  MCV 88.6 88.6 87.5 90.3  PLT 580* 575* 532* 527*   Basic Metabolic Panel: Recent Labs  Lab 06/25/18 0915 06/25/18 1632 06/26/18 0518 06/27/18 0729  NA 140  --  135 135  K 2.9*  --  4.2 4.5  CL 114*  --  102 103  CO2 18*  --  22 23  GLUCOSE 85  --  168* 102*  BUN 13  --  18 27*  CREATININE 0.70 1.19 1.00 1.06  CALCIUM 6.7*  --  9.4 8.8*  MG  --   --  1.8 1.9  PHOS  --   --   --  2.7   GFR: Estimated Creatinine Clearance: 55.7 mL/min (by C-G formula based on SCr of 1.06 mg/dL). Liver Function Tests: Recent Labs  Lab 06/25/18 1430 06/26/18 0518 06/27/18 0729  AST  --  18 15  ALT  --  14 14  ALKPHOS  --  40 46  BILITOT  --  0.9 0.8  PROT  --  8.5* 7.8  ALBUMIN 2.3* 2.6* 2.5*   No results for input(s): LIPASE, AMYLASE in the last 168 hours. No results for input(s): AMMONIA in the last 168 hours. Coagulation Profile: No results for input(s): INR, PROTIME in the last 168 hours. Cardiac Enzymes: Recent Labs  Lab 06/25/18 0915  TROPONINI <0.03   BNP (last 3 results) No results for input(s): PROBNP in the last 8760 hours. HbA1C: No results for input(s): HGBA1C in the last 72 hours. CBG: Recent Labs  Lab 06/27/18 0126  GLUCAP 84   Lipid Profile: No results for input(s): CHOL, HDL, LDLCALC, TRIG, CHOLHDL, LDLDIRECT in the last 72 hours. Thyroid Function Tests: No results for input(s): TSH, T4TOTAL, FREET4, T3FREE, THYROIDAB in the last 72 hours. Anemia Panel: No results for input(s): VITAMINB12, FOLATE, FERRITIN, TIBC, IRON, RETICCTPCT in the last 72 hours. Sepsis Labs: Recent Labs  Lab 06/25/18 7824 06/25/18 1108  LATICACIDVEN 1.0 1.6    Recent Results (from the past 240 hour(s))   Urine culture     Status: None   Collection Time: 06/25/18  9:04 AM  Result Value Ref Range Status   Specimen Description   Final    URINE, CLEAN CATCH Performed at [facility], [address], [place], [place] [address]    Special Requests   Final    NONE Performed at [facility], [address], [place], [place] [address]    Culture   Final    NO GROWTH Performed at [facility], [place] [address], [place], [place] [address]    Report Status 06/26/2018  FINAL  Final  SARS Coronavirus 2 (CEPHEID - Performed in [place] hospital lab), Hosp Order     Status: None   Collection Time: 06/25/18  9:16 AM  Result Value Ref Range Status   SARS Coronavirus 2 NEGATIVE NEGATIVE Final    Comment: (NOTE) If result is NEGATIVE SARS-CoV-2 target nucleic acids are NOT DETECTED. The SARS-CoV-2 RNA is generally detectable in upper and lower  respiratory specimens during the acute phase of infection. The lowest  concentration of SARS-CoV-2 viral copies this assay can detect is 250  copies / mL. A negative result does not preclude SARS-CoV-2 infection  and should not be used as the sole basis for treatment or other  patient management decisions.  A negative result may occur with  improper specimen collection / handling, submission of specimen other  than nasopharyngeal swab, presence of viral mutation(s) within the  areas targeted by this assay, and inadequate number of viral copies  (<250 copies / mL). A negative result must be combined with clinical  observations, patient history, and epidemiological information. If result is POSITIVE SARS-CoV-2 target nucleic acids are DETECTED. The SARS-CoV-2 RNA is generally detectable in upper and lower  respiratory specimens dur ing the acute phase of infection.  Positive  results are indicative of active infection with SARS-CoV-2.  Clinical  correlation with patient history and other diagnostic information is  necessary to determine patient  infection status.  Positive results do  not rule out bacterial infection or co-infection with other viruses. If result is PRESUMPTIVE POSTIVE SARS-CoV-2 nucleic acids MAY BE PRESENT.   A presumptive positive result was obtained on the submitted specimen  and confirmed on repeat testing.  While 2019 novel coronavirus  (SARS-CoV-2) nucleic acids may be present in the submitted sample  additional confirmatory testing may be necessary for epidemiological  and / or clinical management purposes  to differentiate between  SARS-CoV-2 and other Sarbecovirus currently known to infect humans.  If clinically indicated additional testing with an alternate test  methodology [phone]) is advised. The SARS-CoV-2 RNA is generally  detectable in upper and lower respiratory sp ecimens during the acute  phase of infection. The expected result is Negative. Fact Sheet for Patients:  StrictlyIdeas.no Fact Sheet for Healthcare Providers: BankingDealers.co.za This test is not yet approved or cleared by the [place] FDA and has been authorized for detection and/or diagnosis of SARS-CoV-2 by FDA under an Emergency Use Authorization (EUA).  This EUA will remain in effect (meaning this test can be used) for the duration of the COVID-19 declaration under Section 564(b)(1) of the Act, 21 U.S.C. section 360bbb-3(b)(1), unless the authorization is terminated or revoked sooner. Performed at [facility], [address] [place], [place] [address]   Gram stain     Status: None   Collection Time: 06/26/18  9:48 AM  Result Value Ref Range Status   Specimen Description PLEURAL LEFT  Final   Special Requests NONE  Final   Gram Stain   Final    RARE WBC PRESENT, PREDOMINANTLY PMN NO ORGANISMS SEEN Performed at [facility], [place] [address] [place], [place] [address]    Report Status 06/26/2018 FINAL  Final         Radiology Studies: Dg Chest 1 View   Result Date: 06/26/2018 CLINICAL DATA:  Post left thoracentesis EXAM: CHEST  1 VIEW COMPARISON:  06/25/2018 FINDINGS: The left pleural effusion has improved and there is no evidence of pneumothorax. Normal heart size. Stable right subclavian Port-A-Cath.  Opacities in the left lung improved. Stable appearance of the right lung with chronic parenchymal changes. IMPRESSION: No pneumothorax post left thoracentesis. Electronically Signed   By: Marybelle Killings M.D.   On: 06/26/2018 09:53   US Thoracentesis Asp Pleural Space W/img Guide  Result Date: 06/26/2018 INDICATION: Static left lung cancer. Left pleural effusion. Request for diagnostic and therapeutic thoracentesis. EXAM: ULTRASOUND GUIDED LEFT THORACENTESIS MEDICATIONS: 1% lidocaine 15 mL COMPLICATIONS: None immediate. PROCEDURE: An ultrasound guided thoracentesis was thoroughly discussed with the patient and questions answered. The benefits, risks, alternatives and complications were also discussed. The patient understands and wishes to proceed with the procedure. Written consent was obtained. Ultrasound was performed to localize and mark an adequate pocket of fluid in the left chest. The area was then prepped and draped in the normal sterile fashion. 1% Lidocaine was used for local anesthesia. Under ultrasound guidance a 6 Fr Safe-T-Centesis catheter was introduced. Thoracentesis was performed. The catheter was removed and a dressing applied. FINDINGS: A total of approximately 900 mL of clear yellow fluid was removed. Samples were sent to the laboratory as requested by the clinical team. IMPRESSION: Successful ultrasound guided left thoracentesis yielding 900 mL of pleural fluid. No pneumothorax on post-procedure chest x-ray. Read by: Gareth Eagle, PA-C Electronically Signed   By: Marybelle Killings M.D.   On: 06/26/2018 10:02        Scheduled Meds: . amLODipine  5 mg Oral Daily  . aspirin EC  81 mg Oral Daily  . ferrous sulfate  325 mg Oral Q breakfast  .  fluticasone  2 spray Each Nare Daily  . gabapentin  300 mg Oral TID  . guaiFENesin  1,200 mg Oral BID  . heparin  5,000 Units Subcutaneous Q8H  . ipratropium-albuterol  3 mL Nebulization TID  . metoCLOPramide  5 mg Oral TID AC & HS  . metoprolol succinate  25 mg Oral Daily  . mirabegron ER  50 mg Oral QHS  . mometasone-formoterol  2 puff Inhalation BID  . montelukast  10 mg Oral Daily  . multivitamin with minerals  1 tablet Oral Daily  . pantoprazole  40 mg Oral Daily  . senna  1 tablet Oral Daily  . simvastatin  10 mg Oral QHS  . sodium chloride flush  3 mL Intravenous Q12H  . vitamin C  500 mg Oral Daily   Continuous Infusions: . sodium chloride    . ceFEPime (MAXIPIME) IV 2 g (06/27/18 1402)  . vancomycin Stopped (06/27/18 0730)     LOS: 2 days    Time spent: 25 minutes spent in the coordination of care today.     Jonnie Finner, DO [facility] Pager [phone]  If 7PM-7AM, please contact night-coverage [email or web address] Password [email or web address] 06/27/2018, 2:42 PM

## 2018-06-28 ENCOUNTER — Encounter (HOSPITAL_COMMUNITY): Payer: Self-pay | Admitting: Critical Care Medicine

## 2018-06-28 ENCOUNTER — Inpatient Hospital Stay (HOSPITAL_COMMUNITY): Payer: Medicare Other

## 2018-06-28 ENCOUNTER — Other Ambulatory Visit: Payer: Self-pay

## 2018-06-28 ENCOUNTER — Encounter (HOSPITAL_COMMUNITY)
Admission: EM | Disposition: A | Discharge status: Home Health | Payer: Self-pay | Source: Home / Self Care | Attending: Internal Medicine

## 2018-06-28 ENCOUNTER — Inpatient Hospital Stay (HOSPITAL_COMMUNITY): Payer: Medicare Other | Admitting: Certified Registered Nurse Anesthetist

## 2018-06-28 DIAGNOSIS — J90 Pleural effusion, not elsewhere classified: Secondary | ICD-10-CM

## 2018-06-28 HISTORY — PX: CHEST TUBE INSERTION: SHX231

## 2018-06-28 LAB — CBC WITH DIFFERENTIAL/PLATELET
Abs Immature Granulocytes: 0.19 10*3/uL — ABNORMAL HIGH (ref 0.00–0.07)
Basophils Absolute: 0.1 10*3/uL (ref 0.0–0.1)
Basophils Relative: 1 %
Eosinophils Absolute: 1.8 10*3/uL — ABNORMAL HIGH (ref 0.0–0.5)
Eosinophils Relative: 9 %
HCT: 42.5 % (ref 39.0–52.0)
Hemoglobin: 13.4 g/dL (ref 13.0–17.0)
Immature Granulocytes: 1 %
Lymphocytes Relative: 25 %
Lymphs Abs: 5.1 10*3/uL — ABNORMAL HIGH (ref 0.7–4.0)
MCH: 28.6 pg (ref 26.0–34.0)
MCHC: 31.5 g/dL (ref 30.0–36.0)
MCV: 90.8 fL (ref 80.0–100.0)
Monocytes Absolute: 1.2 10*3/uL — ABNORMAL HIGH (ref 0.1–1.0)
Monocytes Relative: 6 %
Neutro Abs: 11.7 10*3/uL — ABNORMAL HIGH (ref 1.7–7.7)
Neutrophils Relative %: 58 %
Platelets: 496 10*3/uL — ABNORMAL HIGH (ref 150–400)
RBC: 4.68 MIL/uL (ref 4.22–5.81)
RDW: 17.9 % — ABNORMAL HIGH (ref 11.5–15.5)
WBC: 20.1 10*3/uL — ABNORMAL HIGH (ref 4.0–10.5)
nRBC: 0 % (ref 0.0–0.2)

## 2018-06-28 LAB — COMPREHENSIVE METABOLIC PANEL
ALT: 14 U/L (ref 0–44)
AST: 14 U/L — ABNORMAL LOW (ref 15–41)
Albumin: 2.4 g/dL — ABNORMAL LOW (ref 3.5–5.0)
Alkaline Phosphatase: 48 U/L (ref 38–126)
Anion gap: 8 (ref 5–15)
BUN: 23 mg/dL (ref 8–23)
CO2: 24 mmol/L (ref 22–32)
Calcium: 8.8 mg/dL — ABNORMAL LOW (ref 8.9–10.3)
Chloride: 100 mmol/L (ref 98–111)
Creatinine, Ser: 1.1 mg/dL (ref 0.61–1.24)
GFR calc Af Amer: >60 mL/min (ref >60)
GFR calc non Af Amer: >60 mL/min (ref >60)
Glucose, Bld: 100 mg/dL — ABNORMAL HIGH (ref 70–99)
Potassium: 4.4 mmol/L (ref 3.5–5.1)
Sodium: 132 mmol/L — ABNORMAL LOW (ref 135–145)
Total Bilirubin: 0.7 mg/dL (ref 0.3–1.2)
Total Protein: 7.4 g/dL (ref 6.5–8.1)

## 2018-06-28 LAB — PHOSPHORUS: Phosphorus: 2.5 mg/dL (ref 2.5–4.6)

## 2018-06-28 LAB — MAGNESIUM: Magnesium: 1.6 mg/dL — ABNORMAL LOW (ref 1.7–2.4)

## 2018-06-28 LAB — MRSA PCR SCREENING: MRSA by PCR: NEGATIVE

## 2018-06-28 SURGERY — INSERTION, PLEURAL DRAINAGE CATHETER
Anesthesia: Monitor Anesthesia Care | Site: Chest | Laterality: Left

## 2018-06-28 MED ORDER — FENTANYL CITRATE (PF) 100 MCG/2ML IJ SOLN
25.0000 ug | INTRAMUSCULAR | Status: DC | PRN
  Administered 2018-06-28 (×2): 25 ug via INTRAVENOUS

## 2018-06-28 MED ORDER — FENTANYL CITRATE (PF) 250 MCG/5ML IJ SOLN
INTRAMUSCULAR | Status: AC
  Filled 2018-06-28: qty 5

## 2018-06-28 MED ORDER — PROPOFOL 10 MG/ML IV BOLUS
INTRAVENOUS | Status: AC
  Filled 2018-06-28: qty 20

## 2018-06-28 MED ORDER — ACETAMINOPHEN 500 MG PO TABS: 1000.0000 mg | ORAL_TABLET | Freq: Once | ORAL | Status: DC | PRN

## 2018-06-28 MED ORDER — LIDOCAINE HCL 1 % IJ SOLN
INTRAMUSCULAR | Status: AC
  Filled 2018-06-28: qty 20

## 2018-06-28 MED ORDER — ACETAMINOPHEN 10 MG/ML IV SOLN
INTRAVENOUS | Status: AC
  Filled 2018-06-28: qty 100

## 2018-06-28 MED ORDER — 0.9 % SODIUM CHLORIDE (POUR BTL) OPTIME
TOPICAL | Status: DC | PRN
  Administered 2018-06-28: 1000 mL

## 2018-06-28 MED ORDER — ACETAMINOPHEN 10 MG/ML IV SOLN
1000.0000 mg | Freq: Once | INTRAVENOUS | Status: DC | PRN
  Administered 2018-06-28: 1000 mg via INTRAVENOUS

## 2018-06-28 MED ORDER — SODIUM CHLORIDE 0.9 % IV SOLN
2.0000 g | Freq: Two times a day (BID) | INTRAVENOUS | Status: DC
  Administered 2018-06-28 – 2018-06-30 (×4): 2 g via INTRAVENOUS
  Filled 2018-06-28 (×5): qty 2

## 2018-06-28 MED ORDER — MIDAZOLAM HCL 5 MG/5ML IJ SOLN
INTRAMUSCULAR | Status: DC | PRN
  Administered 2018-06-28 (×2): 0.5 mg via INTRAVENOUS

## 2018-06-28 MED ORDER — OXYCODONE HCL 5 MG/5ML PO SOLN: 5.0000 mg | Freq: Once | ORAL | Status: DC | PRN

## 2018-06-28 MED ORDER — PROPOFOL 10 MG/ML IV BOLUS
INTRAVENOUS | Status: DC | PRN
  Administered 2018-06-28 (×3): 10 mg via INTRAVENOUS

## 2018-06-28 MED ORDER — LIDOCAINE HCL 1 % IJ SOLN
INTRAMUSCULAR | Status: DC | PRN
  Administered 2018-06-28: 15 mL

## 2018-06-28 MED ORDER — VANCOMYCIN HCL 500 MG IV SOLR
500.0000 mg | Freq: Two times a day (BID) | INTRAVENOUS | Status: DC
  Administered 2018-06-28 – 2018-06-30 (×4): 500 mg via INTRAVENOUS
  Filled 2018-06-28 (×6): qty 500

## 2018-06-28 MED ORDER — FENTANYL CITRATE (PF) 250 MCG/5ML IJ SOLN
INTRAMUSCULAR | Status: DC | PRN
  Administered 2018-06-28 (×2): 25 ug via INTRAVENOUS

## 2018-06-28 MED ORDER — FENTANYL CITRATE (PF) 100 MCG/2ML IJ SOLN
INTRAMUSCULAR | Status: AC
  Filled 2018-06-28: qty 2

## 2018-06-28 MED ORDER — ACETAMINOPHEN 160 MG/5ML PO SOLN: 1000.0000 mg | Freq: Once | ORAL | Status: DC | PRN

## 2018-06-28 MED ORDER — MIDAZOLAM HCL 2 MG/2ML IJ SOLN
INTRAMUSCULAR | Status: AC
  Filled 2018-06-28: qty 2

## 2018-06-28 MED ORDER — OXYCODONE HCL 5 MG PO TABS: 5.0000 mg | ORAL_TABLET | Freq: Once | ORAL | Status: DC | PRN

## 2018-06-28 SURGICAL SUPPLY — 35 items
BRUSH SCRUB EZ PLAIN DRY (MISCELLANEOUS) ×6 IMPLANT
CANISTER SUCT 3000ML PPV (MISCELLANEOUS) ×3 IMPLANT
COVER SURGICAL LIGHT HANDLE (MISCELLANEOUS) ×3 IMPLANT
COVER WAND RF STERILE (DRAPES) ×3 IMPLANT
DERMABOND ADHESIVE PROPEN (GAUZE/BANDAGES/DRESSINGS) ×2
DERMABOND ADVANCED (GAUZE/BANDAGES/DRESSINGS) ×2
DERMABOND ADVANCED .7 DNX12 (GAUZE/BANDAGES/DRESSINGS) ×1 IMPLANT
DERMABOND ADVANCED .7 DNX6 (GAUZE/BANDAGES/DRESSINGS) ×1 IMPLANT
DRAPE C-ARM 42X72 X-RAY (DRAPES) ×3 IMPLANT
DRAPE LAPAROSCOPIC ABDOMINAL (DRAPES) ×3 IMPLANT
ELECT REM PT RETURN 9FT ADLT (ELECTROSURGICAL) ×3
ELECTRODE REM PT RTRN 9FT ADLT (ELECTROSURGICAL) ×1 IMPLANT
GLOVE BIO SURGEON STRL SZ 6 (GLOVE) ×6 IMPLANT
GLOVE BIO SURGEON STRL SZ7.5 (GLOVE) ×6 IMPLANT
GLOVE BIOGEL PI IND STRL 6 (GLOVE) ×2 IMPLANT
GLOVE BIOGEL PI INDICATOR 6 (GLOVE) ×4
GOWN STRL REUS W/ TWL LRG LVL3 (GOWN DISPOSABLE) ×3 IMPLANT
GOWN STRL REUS W/TWL LRG LVL3 (GOWN DISPOSABLE) ×6
KIT BASIN OR (CUSTOM PROCEDURE TRAY) ×3 IMPLANT
KIT PLEURX DRAIN CATH 1000ML (MISCELLANEOUS) ×3 IMPLANT
KIT PLEURX DRAIN CATH 15.5FR (DRAIN) ×3 IMPLANT
KIT TURNOVER KIT B (KITS) ×3 IMPLANT
NEEDLE HYPO 25GX1X1/2 BEV (NEEDLE) ×9 IMPLANT
NS IRRIG 1000ML POUR BTL (IV SOLUTION) ×3 IMPLANT
PACK GENERAL/GYN (CUSTOM PROCEDURE TRAY) ×3 IMPLANT
PAD ARMBOARD 7.5X6 YLW CONV (MISCELLANEOUS) ×6 IMPLANT
SET DRAINAGE LINE (MISCELLANEOUS) IMPLANT
SUT ETHILON 3 0 PS 1 (SUTURE) ×6 IMPLANT
SUT SILK 2 0 SH (SUTURE) ×3 IMPLANT
SUT VIC AB 3-0 SH 8-18 (SUTURE) ×3 IMPLANT
SYR CONTROL 10ML LL (SYRINGE) ×3 IMPLANT
TOWEL GREEN STERILE (TOWEL DISPOSABLE) ×3 IMPLANT
TOWEL GREEN STERILE FF (TOWEL DISPOSABLE) ×3 IMPLANT
VALVE REPLACEMENT CAP (MISCELLANEOUS) IMPLANT
WATER STERILE IRR 1000ML POUR (IV SOLUTION) ×3 IMPLANT

## 2018-06-28 NOTE — Transfer of Care (Signed)
Immediate Anesthesia Transfer of Care Note  Patient: Bradley [name]  Procedure(s) Performed: INSERTION PLEURAL DRAINAGE CATHETER (Left Chest)  Patient Location: PACU  Anesthesia Type:MAC  Level of Consciousness: awake, alert  and oriented  Airway & Oxygen Therapy: Patient Spontanous Breathing and Patient connected to nasal cannula oxygen  Post-op Assessment: Report given to RN and Post -op Vital signs reviewed and stable  Post vital signs: Reviewed and stable  Last Vitals:  Vitals Value Taken Time  BP 127/77 06/28/2018  8:41 AM  Temp    Pulse 87 06/28/2018  8:41 AM  Resp 13 06/28/2018  8:41 AM  SpO2 94 % 06/28/2018  8:41 AM  Vitals shown include unvalidated device data.  Last Pain:  Vitals:   06/28/18 0533  TempSrc: Oral  PainSc:       Patients Stated Pain Goal: 3 (01/28/70 5366)  Complications: No apparent anesthesia complications

## 2018-06-28 NOTE — Patient Outreach (Signed)
[facility] [facility]) Care Management  06/28/2018  Brycen Bean 08/06/1940 [id number]  EMMI: pneumonia red alert Referral date: 06/25/18 Referral reason: feeling better overall: no,   More short of breath than yesterday: yes,   Insurance: [place] health care Day # 2 and Day #4  Telephone call to patient regarding EMMI pneumonia red alert.  HIPAA verified with patient. Patient states he is still in the hospital. He states he was never released.  RNCM informed patient she will have the EMMI pneumonia calls discontinued at this time.  Patient verbalized understanding.   PLAN; RNCM will close due to patient being in the hospital [facility] will notify [facility] hospital liaison's that patient remains hospitalized.  RNCM will send closure letter to patients primary MD  Quinn Plowman RN,BSN,CCM [facility] Telephonic  [phone]

## 2018-06-28 NOTE — Anesthesia Preprocedure Evaluation (Signed)
Anesthesia Evaluation  Patient identified by MRN, date of birth, ID band Patient awake    Reviewed: Allergy & Precautions, NPO status , Patient's Chart, lab work & pertinent test results, reviewed documented beta blocker date and time   History of Anesthesia Complications Negative for: history of anesthetic complications  Airway Mallampati: II  TM Distance: >3 FB Neck ROM: Full    Dental  (+) Edentulous Upper, Edentulous Lower   Pulmonary shortness of breath and with exertion, pneumonia, COPD,  COPD inhaler, Current Smoker,    + rhonchi  + decreased breath sounds      Cardiovascular hypertension, Pt. on medications and Pt. on home beta blockers + angina + CAD and + Past MI   Rhythm:regular     Neuro/Psych  Headaches,  Neuromuscular disease    GI/Hepatic negative GI ROS, Neg liver ROS,   Endo/Other  negative endocrine ROS  Renal/GU negative Renal ROS     Musculoskeletal negative musculoskeletal ROS (+)   Abdominal   Peds  Hematology negative hematology ROS (+)   Anesthesia Other Findings Non-small cell lung CA, b/l Dyspnea with exertion, ambulates with cane MI hx 1992, 1994 Former h/o ETOH abuse, stopped 1996 congoing tob abuse 1 ppd  Reproductive/Obstetrics                             Anesthesia Physical Anesthesia Plan  ASA: IV  Anesthesia Plan: MAC   Post-op Pain Management:    Induction: Intravenous  PONV Risk Score and Plan: 0 and Treatment may vary due to age or medical condition and Propofol infusion  Airway Management Planned: Nasal Cannula and Mask  Additional Equipment: None  Intra-op Plan:   Post-operative Plan:   Informed Consent: I have reviewed the patients History and Physical, chart, labs and discussed the procedure including the risks, benefits and alternatives for the proposed anesthesia with the patient or authorized representative who has indicated  his/her understanding and acceptance.     Dental advisory given  Plan Discussed with: Surgeon and CRNA  Anesthesia Plan Comments:         Anesthesia Quick Evaluation

## 2018-06-28 NOTE — Brief Op Note (Addendum)
06/28/2018  8:42 AM  PATIENT:  [name]  78 y.o. male  PRE-OPERATIVE DIAGNOSIS:  left effusion  POST-OPERATIVE DIAGNOSIS:  left effusion  PROCEDURE:  Procedure(s): INSERTION PLEURAL DRAINAGE CATHETER (Left) Drain L pleural ef-fusion 350 cc SURGEON:  Surgeon(s) and Role:    Ivin Poot, MD - Primary  PHYSICIAN ASSISTANT:   ASSISTANTS: none   ANESTHESIA:   MAC  EBL:  1 mL   BLOOD ADMINISTERED:none  DRAINS: pleurx catheter   LOCAL MEDICATIONS USED:  LIDOCAINE  and Amount: 8 ml  SPECIMEN:  No Specimen  DISPOSITION OF SPECIMEN:  N/A  COUNTS:  YES  TOURNIQUET:  * No tourniquets in log *  DICTATION: .Dragon Dictation  PLAN OF CARE  To 3 [place] room  PATIENT DISPOSITION:  PACU - hemodynamically stable.   Delay start of Pharmacological VTE agent (>24hrs) due to surgical blood loss or risk of bleeding: yes

## 2018-06-28 NOTE — Anesthesia Postprocedure Evaluation (Signed)
Anesthesia Post Note  Patient: Bradley [name]  Procedure(s) Performed: INSERTION PLEURAL DRAINAGE CATHETER (Left Chest)     Patient location during evaluation: PACU Anesthesia Type: MAC Level of consciousness: awake and patient cooperative Pain management: pain level controlled Vital Signs Assessment: post-procedure vital signs reviewed and stable Respiratory status: spontaneous breathing, nonlabored ventilation, respiratory function stable and patient connected to nasal cannula oxygen Cardiovascular status: stable and blood pressure returned to baseline Postop Assessment: no apparent nausea or vomiting Anesthetic complications: no    Last Vitals:  Vitals:   06/28/18 0941 06/28/18 1152  BP: 134/73 (!) 124/57  Pulse: 82 80  Resp: 16 18  Temp: 36.4 C 36.6 C  SpO2: 99% 92%    Last Pain:  Vitals:   06/28/18 1152  TempSrc: Oral  PainSc:                  Anye Brose

## 2018-06-28 NOTE — Progress Notes (Signed)
Pt is leaving for the OR. Pt received a CHG bath. Has been NPO  Since Midnight , no contact isolation, on Oxygen, consent signed. Attempted to call report to the OR at [phone] unsuccessfully. Heart monitor removed, CCMD notified.

## 2018-06-28 NOTE — TOC Initial Note (Signed)
Transition of Care [facility]) - Initial/Assessment Note    Patient Details  Name: Bradley [name] MRN: [id number] Date of Birth: [birth year]  Transition of [facility]) CM/SW Contact:    Sherrilyn Rist Phone Number: [phone] 06/28/2018, 1:02 PM  Clinical Narrative:                 Patient is active with [facility] as prior to admission; paperwork for PleurX supplies faxed as requested. CM will continue to follow for progression of care.  Expected Discharge Plan: [place] Barriers to Discharge: No Barriers Identified   Patient Goals and CMS Choice Patient states their goals for this hospitalization and ongoing recovery are:: To stay at home CMS Medicare.gov Compare Post Acute Care list provided to:: Patient    Expected Discharge Plan and Services Expected Discharge Plan: [place] In-house Referral: NA Discharge Planning Services: CM Consult Post Acute Care Choice: Resumption of Svcs/PTA Provider Living arrangements for the past 2 months: Single Family Home                 DME Arranged: N/A DME Agency: NA       HH Arranged: RN [place] Agency: [place] ([place]) Date HH Agency Contacted: 06/28/18 Time [place]: 60 Representative spoke with at [place]: Hydrographic surveyor  Prior Living Arrangements/Services Living arrangements for the past 2 months: [place] with:: Adult Children Patient language and need for interpreter reviewed:: Yes Do you feel safe going back to the place where you live?: Yes      Need for Family Participation in Patient Care: Yes (Comment) Care giver support system in place?: Yes (comment)   Criminal Activity/Legal Involvement Pertinent to Current Situation/Hospitalization: No - Comment as needed  Activities of Daily Living Home Assistive Devices/Equipment: Cane (specify quad or straight), Hearing aid, Oxygen ADL Screening (condition at time of  admission) Patient's cognitive ability adequate to safely complete daily activities?: Yes Is the patient deaf or have difficulty hearing?: Yes Does the patient have difficulty seeing, even when wearing glasses/contacts?: No Does the patient have difficulty concentrating, remembering, or making decisions?: No Patient able to express need for assistance with ADLs?: Yes Does the patient have difficulty dressing or bathing?: No Independently performs ADLs?: Yes (appropriate for developmental age) Does the patient have difficulty walking or climbing stairs?: Yes Weakness of Legs: None Weakness of Arms/Hands: None  Permission Sought/Granted                  Emotional Assessment       Orientation: : Oriented to Self, Oriented to  Time, Oriented to Place, Oriented to Situation Alcohol / Substance Use: Tobacco Use Psych Involvement: No (comment)  Admission diagnosis:  Shortness of breath [R06.02] Hypokalemia [E87.6] Hypoxia [R09.02] Tobacco use disorder, continuous [F17.209] COPD with acute exacerbation ([place]) [J44.1] HCAP (healthcare-associated pneumonia) [J18.9] Recurrent pleural effusion on left [J90] Patient Active Problem List   Diagnosis Date Noted  . Recurrent left pleural effusion 06/25/2018  . Postobstructive pneumonia 06/25/2018  . On home O2 06/25/2018  . Acute on chronic respiratory failure ([place]) 06/25/2018  . Metastatic cancer ([place])   . Palliative care encounter   . Encounter for hospice care discussion   . Pneumonia 06/13/2018  . Acute maxillary sinusitis 12/30/2017  . Palliative care by specialist   . Goals of care, counseling/discussion   . Malignant neoplasm of left lung ([place])   . Left leg weakness   .  Nosocomial pneumonia 12/29/2017  . Non-small cell carcinoma of left lung ([place]) 12/29/2017  . Mass of lower lobe of left lung 12/10/2017  . Chest pain in adult   . Syncope and collapse   . Rt Hip fracture ([place]) 12/19/2016  . Chest pain 09/12/2016  .  Leukocytosis 09/12/2016  . Abnormal chest x-ray 09/12/2016  . Hyperlipidemia 09/12/2016  . Hypokalemia 09/12/2016  . Precordial pain   . Tobacco abuse 09/11/2016  . Emphysema, unspecified ([place]) 09/11/2016  . CAD (coronary artery disease), native coronary artery 09/11/2016  . Chronic pain syndrome 09/11/2016  . Essential hypertension 09/11/2016  . Right-sided epistaxis 03/15/2016   PCP:  Jani Gravel, MD Pharmacy:   [id number] - , [address] [place] [address] Phone: [phone] Fax: [phone]     Social Determinants of Health (SDOH) Interventions    Readmission Risk Interventions No flowsheet data found.

## 2018-06-28 NOTE — Progress Notes (Addendum)
PT's belongings are packed and put at the nursing station.

## 2018-06-28 NOTE — Plan of Care (Signed)

## 2018-06-28 NOTE — Progress Notes (Signed)
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 He had his thoracentesis with negative cytology. 1.5 L was removed. He was followed by the CT surgeryservice. They had planned to see him in the office later next week. He was discharged on oral antibiotics which he reports taking. 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 He was recently seen by his oncologist Dr. Delton Coombes and patient decided he wanted to continue immunotherapy versus hospice. The patient reports increasing shortness of breath and left-sided chest wall pain over the past 2 days. He does wear oxygen 3 L as needed at home. He has severe COPD. He has diminished exercise tolerance. He has been desaturating with ambulation. He denies fever and chills. Has a chronic cough. He denies back pain.   Assessment & Plan:   Principal Problem:   Recurrent left pleural effusion Active Problems:   Tobacco abuse   Emphysema, unspecified (HCC)   CAD (coronary artery disease), native coronary artery   Essential hypertension   Leukocytosis   Abnormal chest x-ray   Hypokalemia   Non-small cell carcinoma of left lung (HCC)   Postobstructive pneumonia   On home O2   Acute on chronic respiratory failure (HCC)   Acute on chronic respiratory failure secondary to pneumonia and recurrent loculated left pleural effusion - the patient had good relief from the recent thoracentesis done on 06/15/2018 with 1.5 L removed at that time. - CTS consulted, appreciate assistance; L pleurx catheter for Monday     - s/p L pleurx placement, tolerated, spoke with CTS, watch  ON, expect d/c to home in AM; family will need training care  Postobstructive pneumonia-with recent hospitalization - Continue cefepime/vancomycin.  - Continue supportive therapy.  Severe COPD - dulera  Tobacco abuse - patient has been smoking heavily since discharge.  - He is unwilling to stop at this time.  Metastatic non-small cell lung carcinoma - patient recently saw his oncologist Dr. Delton Coombes and wants to pursue ongoing immunotherapy and plans to have a port placed next week.  Essential hypertension - amlodipine, metoprolol  Hypokalemia - resolved  CAD - ASA, statin, metoprolol   DVT prophylaxis:Heparin Code Status:DNR Disposition Plan:To home   Consultants:   CTS  Procedures:   Thoracentesis  Pleurx placement  Antimicrobials:  . vanc/cefepime    Subjective: "My daughter is coming to learn how to work this."  Objective: Vitals:   06/28/18 0856 06/28/18 0911 06/28/18 0932 06/28/18 0941  BP: 127/83 126/79  134/73  Pulse: 84 84 85 82  Resp: 16 18 18 16   Temp:  (!) 97.3 F (36.3 C)  97.6 F (36.4 C)  TempSrc:    Oral  SpO2: 96% 97% 96% 99%  Weight:      Height:        Intake/Output Summary (Last 24 hours) at 06/28/2018 1054 Last data filed at 06/28/2018 0926 Gross per 24 hour  Intake 1320 ml  Output 2301 ml  Net -981 ml   Filed Weights   06/25/18 0847 06/26/18 0439 06/27/18 0559  Weight: 65.8 kg 67.7 kg 67.5 kg    Examination:  General exam:77 y.o.maleAppears calm and comfortable  Respiratory system: Clear  to auscultationanteriorly. Respiratory effort normal. Left pleurx tubing covered by dressing that is CDI Cardiovascular system:S1 &S2 heard, RRR. 1/6 SEMNo JVD, rubs, gallops or clicks. No pedal edema. Gastrointestinal system:Abdomen is nondistended, soft and nontender. No organomegaly or masses felt. Normal bowel sounds heard. Central nervous system:Alert and oriented. No  focal neurological deficits. Extremities: Symmetric 5 x 5 power. Skin: No rashes, lesions or ulcers    Data Reviewed: I have personally reviewed following labs and imaging studies.  CBC: Recent Labs  Lab 06/25/18 0915 06/25/18 1632 06/26/18 0518 06/27/18 0729  WBC 12.0* 11.6* 13.6* 18.4*  NEUTROABS 7.7  --  11.3* 14.1*  HGB 15.5 14.6 13.4 13.5  HCT 48.4 45.1 40.7 41.9  MCV 88.6 88.6 87.5 90.3  PLT 580* 575* 532* 656*   Basic Metabolic Panel: Recent Labs  Lab 06/25/18 0915 06/25/18 1632 06/26/18 0518 06/27/18 0729  NA 140  --  135 135  K 2.9*  --  4.2 4.5  CL 114*  --  102 103  CO2 18*  --  22 23  GLUCOSE 85  --  168* 102*  BUN 13  --  18 27*  CREATININE 0.70 1.19 1.00 1.06  CALCIUM 6.7*  --  9.4 8.8*  MG  --   --  1.8 1.9  PHOS  --   --   --  2.7   GFR: Estimated Creatinine Clearance: 55.7 mL/min (by C-G formula based on SCr of 1.06 mg/dL). Liver Function Tests: Recent Labs  Lab 06/25/18 1430 06/26/18 0518 06/27/18 0729  AST  --  18 15  ALT  --  14 14  ALKPHOS  --  40 46  BILITOT  --  0.9 0.8  PROT  --  8.5* 7.8  ALBUMIN 2.3* 2.6* 2.5*   No results for input(s): LIPASE, AMYLASE in the last 168 hours. No results for input(s): AMMONIA in the last 168 hours. Coagulation Profile: No results for input(s): INR, PROTIME in the last 168 hours. Cardiac Enzymes: Recent Labs  Lab 06/25/18 0915  TROPONINI <0.03   BNP (last 3 results) No results for input(s): PROBNP in the last 8760 hours. HbA1C: No results for input(s): HGBA1C in the last 72 hours. CBG: Recent Labs  Lab 06/27/18 0126  GLUCAP 84   Lipid Profile: No results for input(s): CHOL, HDL, LDLCALC, TRIG, CHOLHDL, LDLDIRECT in the last 72 hours. Thyroid Function Tests: No results for input(s): TSH, T4TOTAL, FREET4, T3FREE, THYROIDAB in the last 72 hours. Anemia Panel: No results for input(s): VITAMINB12, FOLATE, FERRITIN, TIBC, IRON, RETICCTPCT in the last 72 hours. Sepsis Labs: Recent Labs   Lab 06/25/18 8127 06/25/18 1108  LATICACIDVEN 1.0 1.6    Recent Results (from the past 240 hour(s))  Urine culture     Status: None   Collection Time: 06/25/18  9:04 AM  Result Value Ref Range Status   Specimen Description   Final    URINE, CLEAN CATCH Performed at [facility], [address] [place], [place] [address]    Special Requests   Final    NONE Performed at [facility], [address], [place], [place] [address]    Culture   Final    NO GROWTH Performed at [facility], [place] [address], [place], [place] [address]    Report Status 06/26/2018 FINAL  Final  SARS Coronavirus 2 (CEPHEID - Performed in [facility] hospital lab), Hosp Order     Status: None   Collection Time: 06/25/18  9:16 AM  Result  Value Ref Range Status   SARS Coronavirus 2 NEGATIVE NEGATIVE Final    Comment: (NOTE) If result is NEGATIVE SARS-CoV-2 target nucleic acids are NOT DETECTED. The SARS-CoV-2 RNA is generally detectable in upper and lower  respiratory specimens during the acute phase of infection. The lowest  concentration of SARS-CoV-2 viral copies this assay can detect is 250  copies / mL. A negative result does not preclude SARS-CoV-2 infection  and should not be used as the sole basis for treatment or other  patient management decisions.  A negative result may occur with  improper specimen collection / handling, submission of specimen other  than nasopharyngeal swab, presence of viral mutation(s) within the  areas targeted by this assay, and inadequate number of viral copies  (<250 copies / mL). A negative result must be combined with clinical  observations, patient history, and epidemiological information. If result is POSITIVE SARS-CoV-2 target nucleic acids are DETECTED. The SARS-CoV-2 RNA is generally detectable in upper and lower  respiratory specimens dur ing the acute phase of infection.  Positive  results are indicative of active infection with SARS-CoV-2.  Clinical   correlation with patient history and other diagnostic information is  necessary to determine patient infection status.  Positive results do  not rule out bacterial infection or co-infection with other viruses. If result is PRESUMPTIVE POSTIVE SARS-CoV-2 nucleic acids MAY BE PRESENT.   A presumptive positive result was obtained on the submitted specimen  and confirmed on repeat testing.  While 2019 novel coronavirus  (SARS-CoV-2) nucleic acids may be present in the submitted sample  additional confirmatory testing may be necessary for epidemiological  and / or clinical management purposes  to differentiate between  SARS-CoV-2 and other Sarbecovirus currently known to infect humans.  If clinically indicated additional testing with an alternate test  methodology [phone]) is advised. The SARS-CoV-2 RNA is generally  detectable in upper and lower respiratory sp ecimens during the acute  phase of infection. The expected result is Negative. Fact Sheet for Patients:  StrictlyIdeas.no Fact Sheet for Healthcare Providers: BankingDealers.co.za This test is not yet approved or cleared by the [place] FDA and has been authorized for detection and/or diagnosis of SARS-CoV-2 by FDA under an Emergency Use Authorization (EUA).  This EUA will remain in effect (meaning this test can be used) for the duration of the COVID-19 declaration under Section 564(b)(1) of the Act, 21 U.S.C. section 360bbb-3(b)(1), unless the authorization is terminated or revoked sooner. Performed at [facility], [address] [place], [place] [address]   Gram stain     Status: None   Collection Time: 06/26/18  9:48 AM  Result Value Ref Range Status   Specimen Description PLEURAL LEFT  Final   Special Requests NONE  Final   Gram Stain   Final    RARE WBC PRESENT, PREDOMINANTLY PMN NO ORGANISMS SEEN Performed at [facility], [place] [address] [place], [place]  [address]    Report Status 06/26/2018 FINAL  Final         Radiology Studies: Dg Chest Portable 1 View  Result Date: 06/28/2018 CLINICAL DATA:  Insertion of left pleural drainage catheter. EXAM: PORTABLE CHEST 1 VIEW 8:33 a.m. COMPARISON:  Chest x-rays dated 06/28/2018 at 7:38 a.m., 06/26/2018, 06/25/2018 and chest CT dated 06/25/2018 FINDINGS: Left chest tube has been inserted in the interval. No visible residual left pleural effusion. Tiny left apical pneumothorax. Persistent poorly defined infiltrate remains in the left upper lung zone. Diffuse accentuation of the  interstitial markings throughout the remainder of the left lung and throughout the right lung have slightly improved. Power port in good position, unchanged. Heart size and vascularity are normal. Aortic atherosclerosis. No acute bone abnormality. IMPRESSION: 1. Tiny left apical pneumothorax after left chest tube insertion. 2. Resolution of the left pleural effusion. 3. Improved interstitial accentuation in the right lung and at the left lung base. Four persistent ill-defined infiltrate in the left upper lung zone. 4.  Aortic Atherosclerosis (ICD10-I70.0). Electronically Signed   By: Lorriane Shire M.D.   On: 06/28/2018 08:48   Dg Chest Port 1 View  Result Date: 06/28/2018 CLINICAL DATA:  78 year old male with shortness of breath. EXAM: PORTABLE CHEST 1 VIEW COMPARISON:  Chest x-ray 06/26/2018. FINDINGS: Right subclavian single-lumen power porta cath with tip terminating in the distal superior vena cava. Lung volumes remain slightly low. Diffuse but asymmetrically distributed interstitial prominence is noted throughout the lungs bilaterally (left greater than right), increased compared to the prior examination, with worsening patchy asymmetrically distributed ill-defined airspace disease in a similar distribution. Persistent mass-like architectural distortion throughout the left mid to upper lung with superolateral pleural thickening, similar  to prior examinations, related to treated neoplasm. Small left pleural effusion. No definite right pleural effusion. No evidence of pulmonary edema. Heart size is normal. Upper mediastinal contours are distorted by patient positioning. Aortic atherosclerosis. IMPRESSION: 1. Worsening patchy multifocal interstitial and airspace disease asymmetrically distributed in the lungs bilaterally (left greater than right), concerning for progressive multilobar pneumonia. 2. Post treatment related changes in the left mid to upper lung, similar to prior examinations. 3. Aortic atherosclerosis. Electronically Signed   By: Vinnie Langton M.D.   On: 06/28/2018 07:52   Dg C-arm 1-60 Min-no Report  Result Date: 06/28/2018 Fluoroscopy was utilized by the requesting physician.  No radiographic interpretation.        Scheduled Meds: . amLODipine  5 mg Oral Daily  . aspirin EC  81 mg Oral Daily  . fentaNYL      . ferrous sulfate  325 mg Oral Q breakfast  . fluticasone  2 spray Each Nare Daily  . gabapentin  300 mg Oral TID  . guaiFENesin  1,200 mg Oral BID  . heparin  5,000 Units Subcutaneous Q8H  . ipratropium-albuterol  3 mL Nebulization TID  . metoCLOPramide  5 mg Oral TID AC & HS  . metoprolol succinate  25 mg Oral Daily  . mirabegron ER  50 mg Oral QHS  . mometasone-formoterol  2 puff Inhalation BID  . montelukast  10 mg Oral Daily  . multivitamin with minerals  1 tablet Oral Daily  . pantoprazole  40 mg Oral Daily  . senna  1 tablet Oral Daily  . simvastatin  10 mg Oral QHS  . sodium chloride flush  3 mL Intravenous Q12H  . vitamin C  500 mg Oral Daily   Continuous Infusions: . sodium chloride    . acetaminophen    . ceFEPime (MAXIPIME) IV 2 g (06/28/18 0543)  . vancomycin 750 mg (06/28/18 0231)     LOS: 3 days    Time spent: 15 minutes spent in the coordination of care today.    Jonnie Finner, DO [facility] Pager [phone]  If 7PM-7AM, please contact night-coverage  [email or web address] Password [facility] 06/28/2018, 10:54 AM

## 2018-06-28 NOTE — Op Note (Signed)
NAMEDOW, BLAHNIK MEDICAL RECORD JJ:[id number] ACCOUNT [id number] DATE OF BIRTH:[birth year] FACILITY: [facility] LOCATION: [facility] PHYSICIAN:PETER VAN TRIGT III, MD  OPERATIVE REPORT  DATE OF PROCEDURE:  06/28/2018  OPERATION:  Placement of left PleurX catheter.  SURGEON:  Tharon Aquas Trigt III, MD  PREOPERATIVE DIAGNOSIS:  Recurrent left pleural effusion with history of left lung cancer.  POSTOPERATIVE DIAGNOSIS:  Recurrent left pleural effusion with history of left lung cancer.  ANESTHESIA:  MAC.   DESCRIPTION OF PROCEDURE:  The patient was examined in the preop holding where informed consent was documented, the proper site marked, and the procedure explained, including the benefits and risks.  The patient was brought to the operating room and  placed supine on the operating table.  IV conscious sedation was monitored by Anesthesia.  The left chest was prepped and draped as a sterile field.  A proper time-out was performed.  Local lidocaine 1% was infiltrated in the anterior axillary line at  the 5th interspace and at the costal margin.  Two small incisions were made in these areas.  Through the upper incision using the Seldinger technique, a guidewire was passed into the pleural space with C-arm fluoroscopy.  Over this, a dilator, then a  dilator sheath was placed.  The catheter was tunneled from the lower incision to the upper incision and inserted into the left pleural space.  Yellow fluid 350 mL was removed.  C-arm fluoroscopy showed the catheter to be in good position.  Both incisions were then closed in a routine fashion and a sterile dressing was applied.  A chest x-ray in the operating room showed the catheter to be in good position without pneumothorax.  The patient was returned to recovery room in stable condition.  LN/NUANCE  D:06/28/2018 T:06/28/2018 JOB:[id number]

## 2018-06-28 NOTE — Progress Notes (Addendum)
Pre Procedure note for inpatients:   Bradley [name] has been scheduled for Procedure(s): INSERTION PLEURAL DRAINAGE CATHETER (Left) today. The various methods of treatment have been discussed with the patient. After consideration of the risks, benefits and treatment options the patient has consented to the planned procedure.   The patient has been seen and labs reviewed. There are no changes in the patient's condition to prevent proceeding with the planned procedure today.  Recent labs:  Lab Results  Component Value Date   WBC 18.4 (H) 06/27/2018   HGB 13.5 06/27/2018   HCT 41.9 06/27/2018   PLT 531 (H) 06/27/2018   GLUCOSE 102 (H) 06/27/2018   CHOL 107 09/12/2016   TRIG 87 09/12/2016   HDL 36 (L) 09/12/2016   LDLCALC 54 09/12/2016   ALT 14 06/27/2018   AST 15 06/27/2018   NA 135 06/27/2018   K 4.5 06/27/2018   CL 103 06/27/2018   CREATININE 1.06 06/27/2018   BUN 27 (H) 06/27/2018   CO2 23 06/27/2018   TSH 1.246 06/08/2018   INR 1.2 06/15/2018   HGBA1C 5.4 09/11/2016    PCXR ordered for this am preop has not been done Will need CXR before proceeding with Left Pleurx Ivin Poot III, MD 06/28/2018 7:28 AM

## 2018-06-28 NOTE — Anesthesia Procedure Notes (Signed)
Procedure Name: MAC Date/Time: 06/28/2018 7:50 AM Performed by: Wilburn Cornelia, CRNA Pre-anesthesia Checklist: Patient identified, Emergency Drugs available, Suction available, Patient being monitored and Timeout performed Patient Re-evaluated:Patient Re-evaluated prior to induction Oxygen Delivery Method: Nasal cannula Placement Confirmation: positive ETCO2 and breath sounds checked- equal and bilateral Dental Injury: Teeth and Oropharynx as per pre-operative assessment

## 2018-06-29 ENCOUNTER — Other Ambulatory Visit: Payer: Self-pay

## 2018-06-29 ENCOUNTER — Inpatient Hospital Stay (HOSPITAL_COMMUNITY): Payer: Medicare Other

## 2018-06-29 ENCOUNTER — Encounter (HOSPITAL_COMMUNITY): Payer: Self-pay | Admitting: Cardiothoracic Surgery

## 2018-06-29 LAB — COMPREHENSIVE METABOLIC PANEL
ALT: 14 U/L (ref 0–44)
AST: 16 U/L (ref 15–41)
Albumin: 2.6 g/dL — ABNORMAL LOW (ref 3.5–5.0)
Alkaline Phosphatase: 49 U/L (ref 38–126)
Anion gap: 10 (ref 5–15)
BUN: 20 mg/dL (ref 8–23)
CO2: 23 mmol/L (ref 22–32)
Calcium: 9 mg/dL (ref 8.9–10.3)
Chloride: 101 mmol/L (ref 98–111)
Creatinine, Ser: 1.01 mg/dL (ref 0.61–1.24)
GFR calc Af Amer: >60 mL/min (ref >60)
GFR calc non Af Amer: >60 mL/min (ref >60)
Glucose, Bld: 103 mg/dL — ABNORMAL HIGH (ref 70–99)
Potassium: 4.2 mmol/L (ref 3.5–5.1)
Sodium: 134 mmol/L — ABNORMAL LOW (ref 135–145)
Total Bilirubin: 1.1 mg/dL (ref 0.3–1.2)
Total Protein: 7.6 g/dL (ref 6.5–8.1)

## 2018-06-29 LAB — CBC WITH DIFFERENTIAL/PLATELET
Abs Immature Granulocytes: 0 10*3/uL (ref 0.00–0.07)
Basophils Absolute: 0 10*3/uL (ref 0.0–0.1)
Basophils Relative: 0 %
Eosinophils Absolute: 1.4 10*3/uL — ABNORMAL HIGH (ref 0.0–0.5)
Eosinophils Relative: 6 %
HCT: 46 % (ref 39.0–52.0)
Hemoglobin: 14.9 g/dL (ref 13.0–17.0)
Lymphocytes Relative: 11 %
Lymphs Abs: 2.6 10*3/uL (ref 0.7–4.0)
MCH: 29.2 pg (ref 26.0–34.0)
MCHC: 32.4 g/dL (ref 30.0–36.0)
MCV: 90.2 fL (ref 80.0–100.0)
Monocytes Absolute: 0.5 10*3/uL (ref 0.1–1.0)
Monocytes Relative: 2 %
Neutro Abs: 18.8 10*3/uL — ABNORMAL HIGH (ref 1.7–7.7)
Neutrophils Relative %: 81 %
Platelets: 498 10*3/uL — ABNORMAL HIGH (ref 150–400)
RBC: 5.1 MIL/uL (ref 4.22–5.81)
RDW: 17.6 % — ABNORMAL HIGH (ref 11.5–15.5)
WBC: 23.2 10*3/uL — ABNORMAL HIGH (ref 4.0–10.5)
nRBC: 0 % (ref 0.0–0.2)
nRBC: 0 /100 WBC

## 2018-06-29 LAB — TROPONIN I
Troponin I: <0.03 ng/mL (ref <0.03)
Troponin I: <0.03 ng/mL (ref <0.03)

## 2018-06-29 LAB — PHOSPHORUS: Phosphorus: 2.6 mg/dL (ref 2.5–4.6)

## 2018-06-29 LAB — GLUCOSE, CAPILLARY: Glucose-Capillary: 115 mg/dL — ABNORMAL HIGH (ref 70–99)

## 2018-06-29 LAB — BLOOD GAS, ARTERIAL
Acid-base deficit: 0.4 mmol/L (ref 0.0–2.0)
Bicarbonate: 24.2 mmol/L (ref 20.0–28.0)
Drawn by: 336831
O2 Content: 3 L/min
O2 Saturation: 94.1 %
Patient temperature: 98
pCO2 arterial: 42.1 mmHg (ref 32.0–48.0)
pH, Arterial: 7.376 (ref 7.350–7.450)
pO2, Arterial: 71.8 mmHg — ABNORMAL LOW (ref 83.0–108.0)

## 2018-06-29 LAB — MAGNESIUM: Magnesium: 1.7 mg/dL (ref 1.7–2.4)

## 2018-06-29 LAB — AMMONIA: Ammonia: 30 umol/L (ref 9–35)

## 2018-06-29 MED ORDER — QUETIAPINE FUMARATE 25 MG PO TABS: 25.0000 mg | ORAL_TABLET | Freq: Every evening | ORAL | Status: DC | PRN

## 2018-06-29 MED ORDER — SIMETHICONE 80 MG PO CHEW
80.0000 mg | CHEWABLE_TABLET | Freq: Four times a day (QID) | ORAL | Status: DC
  Administered 2018-06-29 – 2018-06-30 (×4): 80 mg via ORAL
  Filled 2018-06-29 (×5): qty 1

## 2018-06-29 MED ORDER — NON FORMULARY: 5.0000 mg | Freq: Every day | Status: DC

## 2018-06-29 MED ORDER — MELATONIN 3 MG PO TABS
6.0000 mg | ORAL_TABLET | Freq: Every day | ORAL | Status: DC
  Administered 2018-06-29: 23:00:00 6 mg via ORAL
  Filled 2018-06-29 (×2): qty 2

## 2018-06-29 NOTE — Progress Notes (Signed)
      [address]       [place] [address]             [phone]      1 Day Post-Op Procedure(s) (LRB): INSERTION PLEURAL DRAINAGE CATHETER (Left)   Subjective:  No new complaints.  Has some pain at site of new pleur-x  Objective: Vital signs in last 24 hours: Temp:  [97.3 F (36.3 C)-98.2 F (36.8 C)] 98 F (36.7 C) (06/02 0728) Pulse Rate:  [73-95] 95 (06/02 0728) Cardiac Rhythm: Normal sinus rhythm (06/01 2030) Resp:  [13-20] 16 (06/02 0728) BP: (121-134)/(57-83) 125/77 (06/02 0728) SpO2:  [89 %-99 %] 98 % (06/02 0728)  Intake/Output from previous day: 06/01 0701 - 06/02 0700 In: 1090 [P.O.:500; I.V.:290; IV Piggyback:300] Out: 801 [Urine:800; Blood:1]  General appearance: alert, cooperative and no distress Heart: regular rate and rhythm Lungs: diminished breath sounds bibasilar Wound: clean and dry  Lab Results: Recent Labs    06/28/18 1050 06/29/18 0701  WBC 20.1* 23.2*  HGB 13.4 14.9  HCT 42.5 46.0  PLT 496* 498*   BMET:  Recent Labs    06/28/18 1050 06/29/18 0701  NA 132* 134*  K 4.4 4.2  CL 100 101  CO2 24 23  GLUCOSE 100* 103*  BUN 23 20  CREATININE 1.10 1.01  CALCIUM 8.8* 9.0    PT/INR: No results for input(s): LABPROT, INR in the last 72 hours. ABG    Component Value Date/Time   TCO2 25 03/15/2016 0747   CBG (last 3)  Recent Labs    06/27/18 0126  GLUCAP 84    Assessment/Plan: S/P Procedure(s) (LRB): INSERTION PLEURAL DRAINAGE CATHETER (Left)  1. S/P Insertion of Left Sided Pleur-x catheter- CXR free from pneumothorax, no significant pleural effusion present... would starting draining Pleur-x catheter tomorrow with schedule of every other day drainage 2. Dispo- care per medicine   LOS: 4 days    Ellwood Handler 06/29/2018

## 2018-06-29 NOTE — Progress Notes (Signed)
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 He had his thoracentesis with negative cytology. 1.5 L was removed. He was followed by the CT surgeryservice. They had planned to see him in the office later next week. He was discharged on oral antibiotics which he reports taking. 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 He was recently seen by his oncologist Dr. Delton [name] and patient decided he wanted to continue immunotherapy versus hospice. The patient reports increasing shortness of breath and left-sided chest wall pain over the past 2 days. He does wear oxygen 3 L as needed at home. He has severe COPD. He has diminished exercise tolerance. He has been desaturating with ambulation. He denies fever and chills. Has a chronic cough. He denies back pain.   Assessment & Plan:   Principal Problem:   Recurrent left pleural effusion Active Problems:   Tobacco abuse   Emphysema, unspecified (HCC)   CAD (coronary artery disease), native coronary artery   Essential hypertension   Leukocytosis   Abnormal chest x-ray   Hypokalemia   Non-small cell carcinoma of left lung (HCC)   Postobstructive pneumonia   On home O2   Acute on chronic respiratory failure (HCC)   Acute on chronic respiratory failure secondary to pneumonia and recurrent loculated left pleural effusion - the patient had good relief from the recent thoracentesis done on 06/15/2018 with 1.5 L removed at that time. - CTS consulted, appreciate assistance; L pleurx catheter for [name]     - s/p L pleurx placement, tolerated, spoke with CTS, watch  ON, expect d/c to home in AM; family will need training care  Postobstructive pneumonia-with recent hospitalization - Continue cefepime/vancomycin.  - Continue supportive therapy.  Severe COPD - dulera  Tobacco abuse - patient has been smoking heavily since discharge.  - He is unwilling to stop at this time.  Metastatic non-small cell lung carcinoma - patient recently saw his oncologist Dr. Delton [name] and wants to pursue ongoing immunotherapy and plans to have a port placed next week.  Essential hypertension - amlodipine, metoprolol  Hypokalemia - resolved  CAD - ASA, statin, metoprolol  CP     - c/o chest pain; mid sternal, reproducible     - EKG negative     - troponin negative  Confusion     - ammonia 30     - ABG ok     - got narcotics, but is on them chronically; let's hold for now     - ?sundowning   DVT prophylaxis: heparin Code Status: DNR   Disposition Plan: To home   Consultants:   CTS  Procedures:   Thoracentesis  L pleurx  Antimicrobials:   vanc/cefepime    Subjective: "My chest hurts"  Objective: Vitals:   06/29/18 1103 06/29/18 1227 06/29/18 1400 06/29/18 1515  BP: 131/71 140/64 (!) 143/62   Pulse: 87 85 79 81  Resp: 16 20  18   Temp: 97.8 F (36.6 C) 98.3 F (36.8 C)    TempSrc: Oral Oral    SpO2: 99% 98%  98%  Weight:      Height:        Intake/Output Summary (  Last 24 hours) at 06/29/2018 1605 Last data filed at 06/29/2018 1555 Gross per 24 hour  Intake 803.22 ml  Output 800 ml  Net 3.22 ml   Filed Weights   06/26/18 0439 06/27/18 0559 06/29/18 0905  Weight: 67.7 kg 67.5 kg 66.7 kg    Examination:  General exam: 78 y.o. male Appears confused Respiratory system: Clear to auscultation. Respiratory effort normal. Cardiovascular system: S1 & S2 heard, RRR. No JVD, murmurs, rubs, gallops or clicks. No pedal edema.; mid sternal chest pain, reproducible Gastrointestinal system:  Abdomen is nondistended, soft and nontender. No organomegaly or masses felt. Normal bowel sounds heard. Central nervous system: Alert and oriented. No focal neurological deficits. Extremities: Symmetric 5 x 5 power. Skin: No rashes, lesions or ulcers    Data Reviewed: I have personally reviewed following labs and imaging studies.  CBC: Recent Labs  Lab 06/25/18 0915 06/25/18 1632 06/26/18 0518 06/27/18 0729 06/28/18 1050 06/29/18 0701  WBC 12.0* 11.6* 13.6* 18.4* 20.1* 23.2*  NEUTROABS 7.7  --  11.3* 14.1* 11.7* 18.8*  HGB 15.5 14.6 13.4 13.5 13.4 14.9  HCT 48.4 45.1 40.7 41.9 42.5 46.0  MCV 88.6 88.6 87.5 90.3 90.8 90.2  PLT 580* 575* 532* 531* 496* 703*   Basic Metabolic Panel: Recent Labs  Lab 06/25/18 0915 06/25/18 1632 06/26/18 0518 06/27/18 0729 06/28/18 1050 06/29/18 0701  NA 140  --  135 135 132* 134*  K 2.9*  --  4.2 4.5 4.4 4.2  CL 114*  --  102 103 100 101  CO2 18*  --  22 23 24 23   GLUCOSE 85  --  168* 102* 100* 103*  BUN 13  --  18 27* 23 20  CREATININE 0.70 1.19 1.00 1.06 1.10 1.01  CALCIUM 6.7*  --  9.4 8.8* 8.8* 9.0  MG  --   --  1.8 1.9 1.6* 1.7  PHOS  --   --   --  2.7 2.5 2.6   GFR: Estimated Creatinine Clearance: 57.8 mL/min (by C-G formula based on SCr of 1.01 mg/dL). Liver Function Tests: Recent Labs  Lab 06/25/18 1430 06/26/18 0518 06/27/18 0729 06/28/18 1050 06/29/18 0701  AST  --  18 15 14* 16  ALT  --  14 14 14 14   ALKPHOS  --  40 46 48 49  BILITOT  --  0.9 0.8 0.7 1.1  PROT  --  8.5* 7.8 7.4 7.6  ALBUMIN 2.3* 2.6* 2.5* 2.4* 2.6*   No results for input(s): LIPASE, AMYLASE in the last 168 hours. Recent Labs  Lab 06/29/18 1354  AMMONIA 30   Coagulation Profile: No results for input(s): INR, PROTIME in the last 168 hours. Cardiac Enzymes: Recent Labs  Lab 06/25/18 0915 06/29/18 1354  TROPONINI <0.03 <0.03   BNP (last 3 results) No results for input(s): PROBNP in the last 8760 hours. HbA1C: No results for input(s):  HGBA1C in the last 72 hours. CBG: Recent Labs  Lab 06/27/18 0126 06/29/18 1227  GLUCAP 84 115*   Lipid Profile: No results for input(s): CHOL, HDL, LDLCALC, TRIG, CHOLHDL, LDLDIRECT in the last 72 hours. Thyroid Function Tests: No results for input(s): TSH, T4TOTAL, FREET4, T3FREE, THYROIDAB in the last 72 hours. Anemia Panel: No results for input(s): VITAMINB12, FOLATE, FERRITIN, TIBC, IRON, RETICCTPCT in the last 72 hours. Sepsis Labs: Recent Labs  Lab 06/25/18 0924 06/25/18 1108  LATICACIDVEN 1.0 1.6    Recent Results (from the past 240 hour(s))  Urine culture     Status:  None   Collection Time: 06/25/18  9:04 AM  Result Value Ref Range Status   Specimen Description   Final    URINE, CLEAN CATCH Performed at [facility], [address], [place], [place] [address]    Special Requests   Final    NONE Performed at [facility], [address], [place], [place] [address]    Culture   Final    NO GROWTH Performed at [facility], [place] [address], [place], [place] [address]    Report Status 06/26/2018 FINAL  Final  SARS Coronavirus 2 (CEPHEID - Performed in [place] hospital lab), Hosp Order     Status: None   Collection Time: 06/25/18  9:16 AM  Result Value Ref Range Status   SARS Coronavirus 2 NEGATIVE NEGATIVE Final    Comment: (NOTE) If result is NEGATIVE SARS-CoV-2 target nucleic acids are NOT DETECTED. The SARS-CoV-2 RNA is generally detectable in upper and lower  respiratory specimens during the acute phase of infection. The lowest  concentration of SARS-CoV-2 viral copies this assay can detect is 250  copies / mL. A negative result does not preclude SARS-CoV-2 infection  and should not be used as the sole basis for treatment or other  patient management decisions.  A negative result may occur with  improper specimen collection / handling, submission of specimen other  than nasopharyngeal swab, presence of viral mutation(s) within the  areas targeted  by this assay, and inadequate number of viral copies  (<250 copies / mL). A negative result must be combined with clinical  observations, patient history, and epidemiological information. If result is POSITIVE SARS-CoV-2 target nucleic acids are DETECTED. The SARS-CoV-2 RNA is generally detectable in upper and lower  respiratory specimens dur ing the acute phase of infection.  Positive  results are indicative of active infection with SARS-CoV-2.  Clinical  correlation with patient history and other diagnostic information is  necessary to determine patient infection status.  Positive results do  not rule out bacterial infection or co-infection with other viruses. If result is PRESUMPTIVE POSTIVE SARS-CoV-2 nucleic acids MAY BE PRESENT.   A presumptive positive result was obtained on the submitted specimen  and confirmed on repeat testing.  While 2019 novel coronavirus  (SARS-CoV-2) nucleic acids may be present in the submitted sample  additional confirmatory testing may be necessary for epidemiological  and / or clinical management purposes  to differentiate between  SARS-CoV-2 and other Sarbecovirus currently known to infect humans.  If clinically indicated additional testing with an alternate test  methodology [phone]) is advised. The SARS-CoV-2 RNA is generally  detectable in upper and lower respiratory sp ecimens during the acute  phase of infection. The expected result is Negative. Fact Sheet for Patients:  StrictlyIdeas.no Fact Sheet for Healthcare Providers: BankingDealers.co.za This test is not yet approved or cleared by the [place] FDA and has been authorized for detection and/or diagnosis of SARS-CoV-2 by FDA under an Emergency Use Authorization (EUA).  This EUA will remain in effect (meaning this test can be used) for the duration of the COVID-19 declaration under Section 564(b)(1) of the Act, 21 U.S.C. section  360bbb-3(b)(1), unless the authorization is terminated or revoked sooner. Performed at [facility], [address], [place],  [address]   Gram stain     Status: None   Collection Time: 06/26/18  9:48 AM  Result Value Ref Range Status   Specimen Description PLEURAL LEFT  Final   Special Requests NONE  Final  Gram Stain   Final    RARE WBC PRESENT, PREDOMINANTLY PMN NO ORGANISMS SEEN Performed at [facility], [place] [address], [place], [place] [address]    Report Status 06/26/2018 FINAL  Final  MRSA PCR Screening     Status: None   Collection Time: 06/28/18  3:17 PM  Result Value Ref Range Status   MRSA by PCR NEGATIVE NEGATIVE Final    Comment:        The GeneXpert MRSA Assay (FDA approved for NASAL specimens only), is one component of a comprehensive MRSA colonization surveillance program. It is not intended to diagnose MRSA infection nor to guide or monitor treatment for MRSA infections. Performed at [facility], [place] [address] [place], [place] [address]          Radiology Studies: Dg Chest Port 1 View  Result Date: 06/29/2018 CLINICAL DATA:  Shortness of breath EXAM: PORTABLE CHEST 1 VIEW COMPARISON:  06/28/2018 FINDINGS: Cardiac shadows within normal limits. Aortic calcifications are again seen. Right-sided chest wall port is again noted. There is persistent but improved density within the left upper lobe. No definitive pneumothorax is seen. PleurX catheter is noted. Diffuse chronic interstitial changes are noted bilaterally. IMPRESSION: Persistent but improved infiltrate within the left upper lobe. PleurX catheter on the left is seen. Underlying chronic changes are noted. Electronically Signed   By: Inez Catalina M.D.   On: 06/29/2018 08:12   Dg Chest Portable 1 View  Result Date: 06/28/2018 CLINICAL DATA:  Insertion of left pleural drainage catheter. EXAM: PORTABLE CHEST 1 VIEW 8:33 a.m. COMPARISON:  Chest x-rays dated 06/28/2018 at 7:38 a.m.,  06/26/2018, 06/25/2018 and chest CT dated 06/25/2018 FINDINGS: Left chest tube has been inserted in the interval. No visible residual left pleural effusion. Tiny left apical pneumothorax. Persistent poorly defined infiltrate remains in the left upper lung zone. Diffuse accentuation of the interstitial markings throughout the remainder of the left lung and throughout the right lung have slightly improved. Power port in good position, unchanged. Heart size and vascularity are normal. Aortic atherosclerosis. No acute bone abnormality. IMPRESSION: 1. Tiny left apical pneumothorax after left chest tube insertion. 2. Resolution of the left pleural effusion. 3. Improved interstitial accentuation in the right lung and at the left lung base. Four persistent ill-defined infiltrate in the left upper lung zone. 4.  Aortic Atherosclerosis (ICD10-I70.0). Electronically Signed   By: Lorriane Shire M.D.   On: 06/28/2018 08:48   Dg Chest Port 1 View  Result Date: 06/28/2018 CLINICAL DATA:  78 year old male with shortness of breath. EXAM: PORTABLE CHEST 1 VIEW COMPARISON:  Chest x-ray 06/26/2018. FINDINGS: Right subclavian single-lumen power porta cath with tip terminating in the distal superior vena cava. Lung volumes remain slightly low. Diffuse but asymmetrically distributed interstitial prominence is noted throughout the lungs bilaterally (left greater than right), increased compared to the prior examination, with worsening patchy asymmetrically distributed ill-defined airspace disease in a similar distribution. Persistent mass-like architectural distortion throughout the left mid to upper lung with superolateral pleural thickening, similar to prior examinations, related to treated neoplasm. Small left pleural effusion. No definite right pleural effusion. No evidence of pulmonary edema. Heart size is normal. Upper mediastinal contours are distorted by patient positioning. Aortic atherosclerosis. IMPRESSION: 1. Worsening patchy  multifocal interstitial and airspace disease asymmetrically distributed in the lungs bilaterally (left greater than right), concerning for progressive multilobar pneumonia. 2. Post treatment related changes in the left mid to upper lung, similar to prior examinations. 3. Aortic  atherosclerosis. Electronically Signed   By: Vinnie Langton M.D.   On: 06/28/2018 07:52   Dg C-arm 1-60 Min-no Report  Result Date: 06/28/2018 Fluoroscopy was utilized by the requesting physician.  No radiographic interpretation.        Scheduled Meds:  amLODipine  5 mg Oral Daily   aspirin EC  81 mg Oral Daily   ferrous sulfate  325 mg Oral Q breakfast   fluticasone  2 spray Each Nare Daily   gabapentin  300 mg Oral TID   guaiFENesin  1,200 mg Oral BID   heparin  5,000 Units Subcutaneous Q8H   ipratropium-albuterol  3 mL Nebulization TID   Melatonin  6 mg Oral QHS   metoCLOPramide  5 mg Oral TID AC & HS   metoprolol succinate  25 mg Oral Daily   mirabegron ER  50 mg Oral QHS   mometasone-formoterol  2 puff Inhalation BID   montelukast  10 mg Oral Daily   multivitamin with minerals  1 tablet Oral Daily   pantoprazole  40 mg Oral Daily   senna  1 tablet Oral Daily   simethicone  80 mg Oral QID   simvastatin  10 mg Oral QHS   sodium chloride flush  3 mL Intravenous Q12H   vitamin C  500 mg Oral Daily   Continuous Infusions:  sodium chloride     ceFEPime (MAXIPIME) IV 2 g (06/29/18 0925)   vancomycin 500 mg (06/29/18 1421)     LOS: 4 days    Time spent: 35 minutes spent in the coordination of care    Jonnie Finner, DO [facility] Pager [phone]  If 7PM-7AM, please contact night-coverage [email or web address] Password [email or web address] 06/29/2018, 4:05 PM

## 2018-06-29 NOTE — Care Management Important Message (Signed)
Important Message  Patient Details  Name: Bradley [name] MRN: [id number] Date of Birth: [birth year]   Medicare Important Message Given:  Yes    [name] 06/29/2018, 12:28 PM

## 2018-06-29 NOTE — Progress Notes (Addendum)
Pt confusion increasing. Yelling out, disrobing, and pulling at medical lines. Was reoriented multiple times to no avail. Is oriented to self and situation but not place and time. C/O chest pain by poiting to mid-chest between nipple line. Unable to describe or rate pain. No other symptoms. No drainage to dressing to L pleural cathetar site. MD paged and immediately came to bedside to assessment pt. New orders obtained. VS and assessment documented. Awaiting results.

## 2018-06-29 NOTE — Progress Notes (Signed)
Responded to PIV consult. Discussed midline with pt; pt stated prefers no midline due to "didn't keep it long last time".

## 2018-06-29 NOTE — Plan of Care (Signed)
  Problem: Education: Goal: Knowledge of General Education information will improve Description: Including pain rating scale, medication(s)/side effects and non-pharmacologic comfort measures Outcome: Progressing   Problem: Clinical Measurements: Goal: Ability to maintain clinical measurements within normal limits will improve Outcome: Progressing   

## 2018-06-30 LAB — CBC WITH DIFFERENTIAL/PLATELET
Abs Immature Granulocytes: 0.28 10*3/uL — ABNORMAL HIGH (ref 0.00–0.07)
Basophils Absolute: 0.1 10*3/uL (ref 0.0–0.1)
Basophils Relative: 0 %
Eosinophils Absolute: 1.1 10*3/uL — ABNORMAL HIGH (ref 0.0–0.5)
Eosinophils Relative: 6 %
HCT: 41.8 % (ref 39.0–52.0)
Hemoglobin: 13.6 g/dL (ref 13.0–17.0)
Immature Granulocytes: 2 %
Lymphocytes Relative: 14 %
Lymphs Abs: 2.6 10*3/uL (ref 0.7–4.0)
MCH: 29 pg (ref 26.0–34.0)
MCHC: 32.5 g/dL (ref 30.0–36.0)
MCV: 89.1 fL (ref 80.0–100.0)
Monocytes Absolute: 0.6 10*3/uL (ref 0.1–1.0)
Monocytes Relative: 3 %
Neutro Abs: 13.7 10*3/uL — ABNORMAL HIGH (ref 1.7–7.7)
Neutrophils Relative %: 75 %
Platelets: 567 10*3/uL — ABNORMAL HIGH (ref 150–400)
RBC: 4.69 MIL/uL (ref 4.22–5.81)
RDW: 17.6 % — ABNORMAL HIGH (ref 11.5–15.5)
WBC: 18.3 10*3/uL — ABNORMAL HIGH (ref 4.0–10.5)
nRBC: 0 % (ref 0.0–0.2)

## 2018-06-30 LAB — COMPREHENSIVE METABOLIC PANEL
ALT: 14 U/L (ref 0–44)
AST: 15 U/L (ref 15–41)
Albumin: 2.4 g/dL — ABNORMAL LOW (ref 3.5–5.0)
Alkaline Phosphatase: 45 U/L (ref 38–126)
Anion gap: 10 (ref 5–15)
BUN: 17 mg/dL (ref 8–23)
CO2: 26 mmol/L (ref 22–32)
Calcium: 9.1 mg/dL (ref 8.9–10.3)
Chloride: 98 mmol/L (ref 98–111)
Creatinine, Ser: 0.97 mg/dL (ref 0.61–1.24)
GFR calc Af Amer: >60 mL/min (ref >60)
GFR calc non Af Amer: >60 mL/min (ref >60)
Glucose, Bld: 115 mg/dL — ABNORMAL HIGH (ref 70–99)
Potassium: 4.1 mmol/L (ref 3.5–5.1)
Sodium: 134 mmol/L — ABNORMAL LOW (ref 135–145)
Total Bilirubin: 1 mg/dL (ref 0.3–1.2)
Total Protein: 7.3 g/dL (ref 6.5–8.1)

## 2018-06-30 LAB — RENAL FUNCTION PANEL
Albumin: 2.4 g/dL — ABNORMAL LOW (ref 3.5–5.0)
Anion gap: 10 (ref 5–15)
BUN: 17 mg/dL (ref 8–23)
CO2: 26 mmol/L (ref 22–32)
Calcium: 9.2 mg/dL (ref 8.9–10.3)
Chloride: 98 mmol/L (ref 98–111)
Creatinine, Ser: 1.02 mg/dL (ref 0.61–1.24)
GFR calc Af Amer: >60 mL/min (ref >60)
GFR calc non Af Amer: >60 mL/min (ref >60)
Glucose, Bld: 113 mg/dL — ABNORMAL HIGH (ref 70–99)
Phosphorus: 3.7 mg/dL (ref 2.5–4.6)
Potassium: 4.4 mmol/L (ref 3.5–5.1)
Sodium: 134 mmol/L — ABNORMAL LOW (ref 135–145)

## 2018-06-30 LAB — MAGNESIUM: Magnesium: 1.8 mg/dL (ref 1.7–2.4)

## 2018-06-30 LAB — TROPONIN I: Troponin I: <0.03 ng/mL (ref <0.03)

## 2018-06-30 LAB — VANCOMYCIN, PEAK: Vancomycin Pk: 26 ug/mL — ABNORMAL LOW (ref 30–40)

## 2018-06-30 LAB — PHOSPHORUS: Phosphorus: 3.7 mg/dL (ref 2.5–4.6)

## 2018-06-30 MED ORDER — VENTOLIN HFA 108 (90 BASE) MCG/ACT IN AERS: 2.0000 | INHALATION_SPRAY | RESPIRATORY_TRACT | 2 refills | Status: DC | PRN

## 2018-06-30 MED ORDER — AMOXICILLIN-POT CLAVULANATE 875-125 MG PO TABS: 1.0000 | ORAL_TABLET | Freq: Two times a day (BID) | ORAL | 0 refills | Status: AC

## 2018-06-30 MED ORDER — IPRATROPIUM-ALBUTEROL 0.5-2.5 (3) MG/3ML IN SOLN: 3.0000 mL | Freq: Four times a day (QID) | RESPIRATORY_TRACT | Status: DC | PRN

## 2018-06-30 NOTE — Consult Note (Signed)
   [facility] CM Inpatient Consult   06/30/2018  Bradley [name] 1940/12/05 [id number]  Patient accessed for re-admission less than 7 days.  Please assign patient to EMMI calls prior to admission and for post hospital needs.  Patient was being followed for Pneumonia EMMI calls.   Plan:  EMMI calls to follow up after transitioning home.  Patient in the [place].      For questions, please contact:  Natividad Brood, RN BSN [facility] Liaison  [phone] business mobile phone Toll free office [phone]  Fax number: [phone] [email or web address]

## 2018-06-30 NOTE — Progress Notes (Signed)
      [address]       [place] [address]             [phone]      2 Days Post-Op Procedure(s) (LRB): INSERTION PLEURAL DRAINAGE CATHETER (Left)   Subjective:  Awoke from sleep, feels so/so  Objective: Vital signs in last 24 hours: Temp:  [97.4 F (36.3 C)-98.3 F (36.8 C)] 98 F (36.7 C) (06/03 0524) Pulse Rate:  [77-93] 83 (06/03 0524) Cardiac Rhythm: Normal sinus rhythm (06/03 0700) Resp:  [16-20] 20 (06/03 0524) BP: (114-145)/(62-85) 130/68 (06/03 0524) SpO2:  [97 %-99 %] 98 % (06/03 0524) Weight:  [66.7 kg-68 kg] 68 kg (06/03 0524)  Intake/Output from previous day: 06/02 0701 - 06/03 0700 In: 683.2 [P.O.:540; IV Piggyback:143.2] Out: 950 [Urine:900; Emesis/NG output:50]  General appearance: cooperative and no distress Heart: regular rate and rhythm Lungs: diminished breath sounds bibasilar Wound: clean and dry  Lab Results: Recent Labs    06/29/18 0701 06/30/18 0353  WBC 23.2* 18.3*  HGB 14.9 13.6  HCT 46.0 41.8  PLT 498* 567*   BMET:  Recent Labs    06/29/18 0701 06/30/18 0353  NA 134* 134*  134*  K 4.2 4.1  4.4  CL 101 98  98  CO2 23 26  26   GLUCOSE 103* 115*  113*  BUN 20 17  17   CREATININE 1.01 0.97  1.02  CALCIUM 9.0 9.1  9.2    PT/INR: No results for input(s): LABPROT, INR in the last 72 hours. ABG    Component Value Date/Time   PHART 7.376 06/29/2018 0955   HCO3 24.2 06/29/2018 0955   TCO2 25 03/15/2016 0747   ACIDBASEDEF 0.4 06/29/2018 0955   O2SAT 94.1 06/29/2018 0955   CBG (last 3)  Recent Labs    06/29/18 1227  GLUCAP 115*    Assessment/Plan: S/P Procedure(s) (LRB): INSERTION PLEURAL DRAINAGE CATHETER (Left)  1. Resume home drainage schedule for right sided pleur-x 2. May start drainage of left sided pleur-x today 3. Okay to d/c home from our standpoint, when appropriate from primary standpoint   LOS: 5 days    Ellwood Handler 06/30/2018

## 2018-06-30 NOTE — Progress Notes (Addendum)
Late entry: when patient took his previous IV out, he scratched his skin in the process on the right posterior arm, nursed cleaned it and applied pink form on it, patient also had one time emesis night shift, Zofran was given as well, day shift nurse updated, will continue to monitor.

## 2018-06-30 NOTE — Progress Notes (Signed)
Daughter at bedisde, this nurse demonstarted the sterile dressing change and pleurx draiin, drained 82ml serosang dr,pt tolerated fairly well w/o some c/o discomfort, pain med po was given prior to procedure, advised advanced would come assist and educate, also gave print out of pleurx drain care and dressing change

## 2018-06-30 NOTE — Discharge Summary (Signed)
**Note De-Identified vi Obfusction** Physicin Dischrge Summry  Lthon Adn TXM:[id number] DOB: [birth year] DOA: 06/25/2018  PCP: Jni Grvel, MD  Admit dte: 06/25/2018 Dischrge dte: 06/30/2018  Time spent: 45 minutes  Recommendtions for Outptient Follow-up:  1. Follow up outptient CBC/CMP 2. Follow up with CT surgery outptient for pleurx ctheter 3. Dischrged on dditionl 10 dys bx, follow repet CXR s outptient nd djust durtion s needed 4.  Follow up with Dr. 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ter being treted for  loculted left pleurl effusion nd pneumoni. He hd his thorcentesis with negtive cytology. 1.5 L ws removed. He ws followed by the CT surgeryservice. They hd plnned to see him in the office lter next week. He ws dischrged on orl ntibiotics which he reports tking. He hs metstses to the liver nd bilterl medistinl denopthy. He ws recently seen by his oncologist Dr. Delton Coombes nd ptient decided he wnted to continue immunotherpy versus hospice. The ptient reports incresing shortness of breth nd left-sided chest wll pin over the pst 2 dys. He  does wer oxygen 3 L s needed t home. He hs severe COPD. He hs diminished exercise tolernce. He hs been desturting with mbultion. He denies fever nd chills. Hs  chronic cough. He denies bck pin.  He ws dmitted for cute on chronic respirtory filure 2/2 pneumoni nd  recurrent loculted effusion.  CT surgery ws c/s who plced L sided pleurx ctheter.  Pt dischrged on 6/3 with plns for outptient f/u with CT surgery, oncology, nd his PCP.  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 - repet thorcentesis done on 5/30 with 900 cc of cler yellow fluid - pleurx ctheter plced on 6/1 by CT surgery - grm stin without orgnism from 5/30, 5/19 cx ngtd.  Unfortuntely, no cx obtined from 5/30 thorcentesis. - Pln for dditionl 10 dys ugmentin  - Exudtive per lights - typicl cells on cytology "not definitively dignostic of mlignncy" per pthology - d/c with home helth  Postobstructive pneumoni-with recent hospitliztion - negtive MRSA PCR - dischrge with ugmentin  - f/u repet CXR outptient   Severe COPD - duler - he notes he's on 3 L chroniclly t home  Tobcco buse - ptient hs been smoking hevily since dischrge.  - He is unwilling to stop t this time.  Metsttic non-smll cell lung crcinom - ptient recently sw his oncologist Dr. Delton Coombes nd wnts to pursue ongoing immunotherpy nd plns to hve  port plced next week. **Note De-Identified vi Obfusction** Essentil hypertension - mlodipine, metoprolol  Hypoklemi - resolved  CD - S, sttin, metoprolol  CP     - c/o chest pin; mid sternl, reproducible     - EKG negtive     - troponin negtive - resolved, follow up outptient s  needed  Confusion     - mmoni 30     - BG ok     - got nrcotics, but is on them chroniclly; let's hold for now     - ?sundowning  Procedures:  Thorcentesis  L pleurx ctheter  Consulttions: CT surgery  Dischrge Exm: Vitls:   06/30/18 1102 06/30/18 1138  BP: (!) 106/53 111/62  Pulse: 89 80  Resp: 18 20  Temp:  (!) 97.5 F (36.4 C)  SpO2: 95% 96%   Redy to go home, looking forwrd to this Discussed with dughter Pt &Ox3 Chrt notes R pleurx s well by CT surgery, but this ppers to be error s pt only hs L  Generl: No cute distress. Crdiovsculr: Hert sounds show  regulr rte, nd rhythm. . Lungs: Cler to usculttion bilterlly  bdomen: Soft, nontender, nondistended  Neurologicl: lert nd oriented 3. Moves ll extremities 4. Crnil nerves II through XII grossly intct. Skin: Wrm nd dry. No rshes or lesions. Extremities: No clubbing or cynosis. No edem. Psychitric: Mood nd ffect re norml. Insight nd judgment re pproprite.  Dischrge Instructions   Dischrge Instructions    Cll MD for:  difficulty brething, hedche or visul disturbnces   Complete by:  s directed    Cll MD for:  extreme ftigue   Complete by:  s directed    Cll MD for:  hives   Complete by:  s directed    Cll MD for:  persistnt dizziness or light-hededness   Complete by:  s directed    Cll MD for:  persistnt nuse nd vomiting   Complete by:  s directed    Cll MD for:  redness, tenderness, or signs of infection (pin, swelling, redness, odor or green/yellow dischrge round incision site)   Complete by:  s directed    Cll MD for:  severe uncontrolled pin   Complete by:  s directed    Cll MD for:  temperture >100.4   Complete by:  s directed    Diet - low sodium hert helthy   Complete by:  s directed    Dischrge instructions   Complete by:  s directed    You were seen for  recurrent pleurl effusion.  You hd   pleurx ctheter plced.  Per crdiothorcic surgery, this should be drined every other dy going forwrd.  Plese follow up with crdiothorcic surgery s n outptient.  I'll dischrge you on n dditionl 10 dys of ntibiotics.    Plese follow up with your PCP, oncologist, nd CT surgeon s n outptient.  Return for new, recurrent, or worsening symptoms.  Plese sk your PCP to request records from this hospitliztion so they know wht ws done nd wht the next steps will be.   Increse ctivity slowly   Complete by:  s directed      llergies s of 06/30/2018      Rections   spirin    Regulr s      Mediction List    STOP tking these medictions   celecoxib 200 MG cpsule Commonly known s:  CELEBREX     TKE these medictions   dvir HF 115-21 MCG/CT inhler Generic drug: **Note De-Identified vi Obfusction** fluticsone-slmeterol Inhle 2 puffs into the lungs every 12 (twelve) hours.   mLODipine 5 MG tblet Commonly known s:  NORVASC TAKE 1 TABLET BY MOUTH EVERY DAY   moxicillin-clvulnte 875-125 MG tblet Commonly known s:  Augmentin Tke 1 tblet by mouth 2 (two) times dily for 10 dys.   spirin EC 81 MG tblet Tke 81 mg by mouth dily.   cyclobenzprine 5 MG tblet Commonly known s:  FLEXERIL Tke 5 mg by mouth 3 (three) times dily.   fluticsone 50 MCG/ACT nsl spry Commonly known s:  FLONASE Plce 2 sprys into both nostrils dily.   gbpentin 300 MG cpsule Commonly known s:  NEURONTIN Tke 1 cpsule by mouth 3 (three) times dily.   HM Potssium 595 (99 K) MG Tbs tblet Generic drug:  potssium gluconte Tke 595 mg by mouth dily.   iprtropium-lbuterol 0.5-2.5 (3) MG/3ML Soln Commonly known s:  DUONEB Tke 3 mLs by nebuliztion 3 (three) times dily.   Iron 325 (65 Fe) MG Tbs Tke 1 tblet by mouth dily.   lidocine-prilocine crem Commonly known s:  EMLA Apply 1 ppliction topiclly s needed. Apply  smll mount to port site nd  cover with plstic wrp 1 hour prior to infusion ppointments   lisinopril 20 MG tblet Commonly known s:  ZESTRIL Tke 1 tblet (20 mg totl) by mouth dily.   metoCLOPrmide 5 MG tblet Commonly known s:  REGLAN Tke 1 tblet by mouth 4 (four) times dily -  before mels nd t bedtime.   metoprolol succinte 25 MG 24 hr tblet Commonly known s:  TOPROL-XL Tke 25 mg by mouth dily.   montelukst 10 MG tblet Commonly known s:  SINGULAIR Tke 1 tblet by mouth dily.   multivitmin with minerls tblet Tke 1 tblet by mouth dily.   Myrbetriq 50 MG Tb24 tblet Generic drug:  mirbegron ER Tke 1 tblet by mouth dily.   nitroGLYCERIN 0.4 MG SL tblet Commonly known s:  NITROSTAT Plce 0.4 mg under the tongue every 5 (five) minutes s needed for chest pin.   OPDIVO IV Inject into the vein every 14 (fourteen) dys.   Oxycodone HCl 10 MG Tbs Tke 1 tblet by mouth every 6 (six) hours.   polyethylene glycol 17 g pcket Commonly known s:  MIRALAX / GLYCOLAX Tke 17 g by mouth dily s needed for mild constiption.   PriLOSEC 40 MG cpsule Generic drug:  omeprzole Tke 1 cpsule by mouth dily.   senn 8.6 MG Tbs tblet Commonly known s:  SENOKOT Tke 1 tblet (8.6 mg totl) by mouth dily.   simvsttin 10 MG tblet Commonly known s:  ZOCOR Tke 10 mg by mouth t bedtime.   Ventolin HFA 108 (90 Bse) MCG/ACT inhler Generic drug:  lbuterol Inhle 2 puffs into the lungs every 4 (four) hours s needed for up to 30 dys for wheezing or shortness of breth.   Vitmin C 500 MG Cps Tke 500 mg by mouth dily.   YERVOY IV Inject into the vein every 6 (six) weeks.      Allergies  Allergen Rections  . Aspirin     Regulr s    Follow-up Informtion    Jni Grvel, MD Follow up.   Specilty:  Internl Medicine Contct informtion: [place] [id number] [phone]        Herminio Commons, MD .    Specilty:  Crdiology Contct informtion: [place] [id number] **Note De-Identified vi Obfusction** [phone]        Ivin Poot, MD Follow up.   Specilty:  Crdiothorcic Surgery Why:  Plese cll for  follow up ppointment Contct informtion: [place] [id number] [phone]        Derek Jck, MD Follow up.   Specilty:  Hemtology Why:  Cll for  follow up ppointment  Contct informtion: [place] [id number] [phone]            The results of significnt dignostics from this hospitliztion (including imging, microbiology, ncillry nd lbortory) re listed below for reference.    Significnt Dignostic Studies: Dg Chest 1 View  Result Dte: 06/26/2018 CLINICL DT:  Post left thorcentesis EXM: CHEST  1 VIEW COMPRISON:  06/25/2018 FINDINGS: The left pleurl effusion hs improved nd there is no evidence of pneumothorx. Norml hert size. Stble right subclvin Port--Cth. Opcities in the left lung improved. Stble ppernce of the right lung with chronic prenchyml chnges. IMPRESSION: No pneumothorx post left thorcentesis. Electroniclly Signed   By: Mrybelle Killings M.D.   On: 06/26/2018 09:53   Dg Chest 1 View  Result Dte: 06/15/2018 CLINICL DT:  Sttus post thorcentesis EXM: CHEST  1 VIEW COMPRISON:  06/13/2018 FINDINGS: Intervl thorcentesis hs been performed on the left. No pneumothorx is seen. Persistent infiltrtes re noted prticulrly in the mid nd upper left lung. Emphysemtous chnges re noted. Right chest wll port is seen. IMPRESSION: No evidence of pneumothorx following thorcentesis. Electroniclly Signed   By: Inez Ctlin M.D.   On: 06/15/2018 11:04   Dg Chest 2 View  Result Dte: 06/22/2018 CLINICL DT:  Left-sided chest pin. dvnced smll cell lung cncer. COPD. Shortness of breth. Productive cough. EXM: CHEST - 2 VIEW COMPRISON:  06/18/2018 FINDINGS: Power port  unchnged. Chronic lung disese with scrring seen diffusely. Focl consolidtion nd pleurl density in the left mid upper chest ppers similr to the previous study. No worsening or new findings. IMPRESSION: Similr ppernce to the previous study. Widespred lung disese with emphysem nd scrring. Focl prenchyml density nd pleurl density in the left mid to upper chest ppers similr. Electroniclly Signed   By: Nelson Chimes M.D.   On: 06/22/2018 12:54   Dg Chest 2 View  Result Dte: 06/17/2018 CLINICL DT:  Shortness of breth nd left-sided chest pin EXM: CHEST - 2 VIEW COMPRISON:  06/16/2018 FINDINGS: Crdic shdow is stble. Right chest wll port is gin seen. Diffuse irspce density is gin noted in left upper lobe stble from the prior exm. No enlrging pleurl fluid is seen. Chronic fibrotic chnges re seen bilterlly stble from the previous exm. No bony bnormlity is noted. IMPRESSION: Stble predominntly left upper lobe density superimposed over diffuse chronic fibrotic chnge. No enlrging effusion is seen. Electroniclly Signed   By: Inez Ctlin M.D.   On: 06/17/2018 09:13   Dg Chest 2 View  Result Dte: 06/16/2018 CLINICL DT:  Pleurl effusion EXM: CHEST - 2 VIEW COMPRISON:  Yesterdy FINDINGS: irspce disese with volume loss on the left. There is left pleurl fluid by CT without evidence of increse. Norml hert size. Port ctheter in stble position IMPRESSION: 1. Stble extensive irspce disese on the left. No evidence of incresing pleurl fluid. 2. COPD. Electroniclly Signed   By: Monte Fntsi M.D.   On: 06/16/2018 10:06   Ct ngio Chest Pe W/cm &/or Wo Cm  Result Dte: 06/25/2018 CLINICL DT:  History of lung cncer. Sttus post left thorcentesis **Note De-Identified vi Obfusction** 2 weeks go. Now with shortness of breth which begn 2 dys go. EXAM: CT ANGIOGRAPHY CHEST WITH CONTRAST TECHNIQUE: Multidetector CT imging of the chest ws performed using the stndrd protocol  during bolus dministrtion of intrvenous contrst. Multiplnr CT imge reconstructions nd MIPs were obtined to evlute the vsculr ntomy. CONTRAST:  76mL OMNIPAQUE IOHEXOL 350 MG/ML SOLN COMPARISON:  06/13/2018 FINDINGS: Crdiovsculr: Norml hert size. There is no pericrdil effusion identified. Aortic therosclerosis. The min pulmonry rtery ppers ptent. No sddle embolus. No centrl obstructing pulmonry emboli identified. No lobr or segmentl pulmonry rtery filling defects identified. Medistinum/Nodes: Norml ppernce of the thyroid glnd. The trche ppers ptent nd is midline. Norml ppernce of the esophgus. No xillry or suprclviculr denopthy. Hilr nd medistinl denopthy is gin identified nd is unchnged from 06/13/18. -right prtrchel lymph node mesures 1.4 cm, imge 42/4. Index right hilr lymph node is unchnged t 2.0 cm, imge 51/4. -subcrinl lymph node mesures 1.3 cm, imge 57/4. -left hilr lymph node is unchnged mesuring 1.9 cm. Lungs/Pleur: Moderte volume loculted left pleurl effusion is gin identified overlying the left lung. Similr in volume to previous exm. Advnced centrilobulr nd prseptl emphysem. There is  lrge re of msslike rchitecturl distortion within the perihilr left lung encsing the left hilr structures mesuring 8.9 cm, imge 139/5. Similr to previous exm. Additionlly extensive irspce densities nd fibrosis extends into the left upper lobe nd left pex. Findings re fvored to represent  combintion of progressive tumor, rdition chnge, nd potentilly postobstructive pneumonitis. The right lung is cler. 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 There is  subcpsulr low-ttenution mss overlying segment 6/5 mesuring 4.1 cm. New from 02/15/2018 PET-CT. Gllstones. Musculoskeletl: No chest wll  bnormlity. No cute or significnt osseous findings. Unchnged T11 compression deformity. Review of the MIP imges confirms the bove findings. IMPRESSION: 1. No evidence for cute pulmonry embolus. 2. Intervl reccumultion of loculted left pleurl effusion sttus post thorcentesis on 06/15/2018. 3. Persistent msslike rchitecturl distortion nd irspce consolidtion within the left lung which my reflect progressive tumor nd/or postobstructive pneumonitis or pneumoni. 4. No chnge in bilterl hilr nd medistinl denopthy. 5. Similr ppernce of liver metstsis from 06/13/2018. 6. Aortic Atherosclerosis (ICD10-I70.0) nd Emphysem (ICD10-J43.9). Electroniclly Signed   By: Kerby Moors M.D.   On: 06/25/2018 12:02   Ct Angio Chest Pe W And/or Wo Contrst  Result Dte: 06/13/2018 CLINICAL DATA:  Chest pin, rule out pulmonry embolism. History of lung cncer treted with immune therpy EXAM: CT ANGIOGRAPHY CHEST WITH CONTRAST TECHNIQUE: Multidetector CT imging of the chest ws performed using the stndrd protocol during bolus dministrtion of intrvenous contrst. Multiplnr CT imge reconstructions nd MIPs were obtined to evlute the vsculr ntomy. CONTRAST:  82mL OMNIPAQUE IOHEXOL 350 MG/ML SOLN COMPARISON:  Chest x-ry 06/13/2018.  PET 02/15/2018 FINDINGS: Crdiovsculr: Negtive for pulmonry embolism. Atherosclerotic ortic rch without neurysm. Hert size norml. No pericrdil effusion Medistinum/Nodes: Right hilr denopthy. Multiple enlrged lymph nodes in the right hilum mesuring up to 2 cm ech. Anterior medistinl mss 38 x 50 mm not present on the prior PET. This is concerning for recurrent tumor. Anterior medistinl lymph node 10 mm. Peri crin lymph node 15 mm on the left. Subcrinl lymph node 14 mm. Lungs/Pleur: Modertely lrge loculted left effusion. Extensive irspce disese in the left lung could represent pneumoni however given history of crcinom,  close imging follow-up is recommended. Severe emphysem. Right upper lobe density is **Note De-Identified vi Obfusction** unchnged my represent scrring. No pleurl effusion on the right. Upper Abdomen: Intervl development of new liver lesions comptible with metsttic disese. 26 x 14 mm lesion in the posterior right lobe liver. 42 x 25 mm lesion in the medil cudl liver. Centrl liver lesion positive on PET is difficult to see on the current study. Clcified gllstone. Musculoskeletl: Chronic compression frcture pproximtely T11. No cute skeletl bnormlity. Review of the MIP imges confirms the bove findings. IMPRESSION: 1. Negtive for pulmonry embolism 2. Loculted left pleurl effusion with extensive irspce disese on the left likely pneumoni. Given history of lung cncer close follow-up recommended 3. Right hilr denopthy, showing incresed uptke on PET. Peri crin lymph nodes lso showing uptke on PET. 4. Anterior medistinl mss is new nd suspicious for recurrent tumor 5. New liver lesions comptible with metsttic disese. Electroniclly Signed   By: Frnchot Gllo M.D.   On: 06/13/2018 17:23   Dg Chest Port 1 View  Result Dte: 06/29/2018 CLINICAL DATA:  Shortness of breth EXAM: PORTABLE CHEST 1 VIEW COMPARISON:  06/28/2018 FINDINGS: Crdic shdows within norml limits. Aortic clcifictions re gin seen. Right-sided chest wll port is gin noted. There is persistent but improved density within the left upper lobe. No definitive pneumothorx is seen. PleurX ctheter is noted. Diffuse chronic interstitil chnges re noted bilterlly. IMPRESSION: Persistent but improved infiltrte within the left upper lobe. PleurX ctheter on the left is seen. Underlying chronic chnges re noted. Electroniclly Signed   By: Inez Ctlin M.D.   On: 06/29/2018 08:12   Dg Chest Portble 1 View  Result Dte: 06/28/2018 CLINICAL DATA:  Insertion of left pleurl dringe ctheter. EXAM: PORTABLE CHEST 1 VIEW 8:33 .m.  COMPARISON:  Chest x-rys dted 06/28/2018 t 7:38 .m., 06/26/2018, 06/25/2018 nd chest CT dted 06/25/2018 FINDINGS: Left chest tube hs been inserted in the intervl. No visible residul left pleurl effusion. Tiny left picl pneumothorx. Persistent poorly defined infiltrte remins in the left upper lung zone. Diffuse ccentution of the interstitil mrkings throughout the reminder of the left lung nd throughout the right lung hve slightly improved. Power port in good position, unchnged. Hert size nd vsculrity re norml. Aortic therosclerosis. No cute bone bnormlity. IMPRESSION: 1. Tiny left picl pneumothorx fter left chest tube insertion. 2. Resolution of the left pleurl effusion. 3. Improved interstitil ccentution in the right lung nd t the left lung bse. Four persistent ill-defined infiltrte in the left upper lung zone. 4.  Aortic Atherosclerosis (ICD10-I70.0). Electroniclly Signed   By: Lorrine Shire M.D.   On: 06/28/2018 08:48   Dg Chest Port 1 View  Result Dte: 06/28/2018 CLINICAL DATA:  56 yer old mle with shortness of breth. EXAM: PORTABLE CHEST 1 VIEW COMPARISON:  Chest x-ry 06/26/2018. FINDINGS: Right subclvin single-lumen power port cth with tip terminting in the distl superior ven cv. Lung volumes remin slightly low. Diffuse but symmetriclly distributed interstitil prominence is noted throughout the lungs bilterlly (left greter thn right), incresed compred to the prior exmintion, with worsening ptchy symmetriclly distributed ill-defined irspce disese in  similr distribution. Persistent mss-like rchitecturl distortion throughout the left mid to upper lung with superolterl pleurl thickening, similr to prior exmintions, relted to treted neoplsm. Smll left pleurl effusion. No definite right pleurl effusion. No evidence of pulmonry edem. Hert size is norml. Upper medistinl contours re distorted by ptient  positioning. Aortic therosclerosis. IMPRESSION: 1. Worsening ptchy multifocl interstitil nd irspce disese symmetriclly distributed in the lungs bilterlly (left greter thn right), concerning for progressive multilobr pneumoni. 2. **Note De-Identified vi Obfusction** Post tretment relted chnges in the left mid to upper lung, similr to prior exmintions. 3. ortic therosclerosis. Electroniclly Signed   By: Vinnie Lngton M.D.   On: 06/28/2018 07:52   Dg Chest Port 1 View  Result Dte: 06/25/2018 CLINICL DT:  Shortness of breth. History of smll cell lung cncer. EXM: PORTBLE CHEST 1 VIEW COMPRISON:  Chest x-ry dted My 26, 2020. FINDINGS: Unchnged right chest wll port ctheter. Norml hert size. Severe emphysem nd extensive pulmonry scrring gin noted. Unchnged loculted left pleurl effusion with left upper greter thn lower lobe irspce disese. The right lung is cler. No pneumothorx. No cute osseous bnormlity. IMPRESSION: 1. Unchnged extensive left lung irspce disese nd loculted pleurl effusion superimposed on severe emphysem. Electroniclly Signed   By: Titus Dubin M.D.   On: 06/25/2018 09:36   Dg Chest Port 1 View  Result Dte: 06/18/2018 CLINICL DT:  Thorcentesis follow-up EXM: PORTBLE CHEST 1 VIEW COMPRISON:  [place] FINDINGS: Port on the right with tip t the SVC. Unchnged extensive opcifiction with volume loss nd pleurl fluid/thickening t the pex. This ws chrcterized by chest CT 06/13/2018. Norml hert size. Emphysem. IMPRESSION: Unchnged extensive left-sided irspce disese superimposed on dvnced emphysem. No incresing pleurl fluid. Electroniclly Signed   By: Monte Fntsi M.D.   On: 06/18/2018 09:03   Dg Chest Port 1 View  Result Dte: 06/13/2018 CLINICL DT:  Chest pin. Ptient hs history of stge IV lung nd liver cncer. EXM: PORTBLE CHEST 1 VIEW COMPRISON:  Mrch 27, 2020 FINDINGS: Significnt opcifiction of the left hemithorx  is  new finding. Stble right Port--Cth. No chnge in the crdiomedistinl silhouette. Chronic emphysemtous chnges re gin seen on the right. No suspicious nodules or msses on the right. IMPRESSION: 1. Widespred opcifiction in the left hemithorx is  new in the intervl nd nonspecific. If the ptient hs signs of infection, widespred pneumoni should be considered. Electroniclly Signed   By: Dorise Bullion III M.D   On: 06/13/2018 14:25   Dg C-rm 1-60 Min-no Report  Result Dte: 06/28/2018 Fluoroscopy ws utilized by the requesting physicin.  No rdiogrphic interprettion.   Ir Thorcentesis sp Pleurl Spce W/img Guide  Result Dte: 06/15/2018 INDICTION: 49 yer old with lung cncer. Left chest wll pin, shortness of breth nd left pleurl effusion. Request for chest tube plcement. EXM: ULTRSOUND GUIDED LEFT THORCENTESIS MEDICTIONS: None. COMPLICTIONS: None immedite. PROCEDURE: Left chest ws evluted with ultrsound. Simple left pleurl fluid ws identified. I felt the ptient would benefit from thorcentesis rther thn chest tube plcement t this time. Discussed with Dr. Prescott Gum nd we decided to proceed with only ultrsound-guided thorcentesis. n ultrsound guided thorcentesis ws thoroughly discussed with the ptient nd questions nswered. The benefits, risks, lterntives nd complictions were lso discussed. The ptient understnds nd wishes to proceed with the procedure. Written consent ws obtined. Ultrsound ws performed to loclize nd mrk n dequte pocket of fluid in the left chest. The re ws then prepped nd drped in the norml sterile fshion. 1% Lidocine ws used for locl nesthesi. Under ultrsound guidnce  6 Fr Sfe-T-Centesis ctheter ws introduced. Thorcentesis ws performed. The ctheter ws removed nd  dressing pplied. FINDINGS:  totl of pproximtely 1.45 L of drk yellow fluid ws removed. Smples were sent to the lbortory  s requested by the clinicl tem. IMPRESSION: Successful ultrsound guided left thorcentesis yielding 1.45 L of pleurl fluid. Electroniclly Signed   By: Mrkus Dft M.D.   On: 06/15/2018 11:29   [place] Thorcentesis **Note De-Identified vi Obfusction** sp Pleurl Spce W/img Guide  Result Dte: 06/26/2018 INDICTION: Sttic left lung cncer. Left pleurl effusion. Request for dignostic nd therpeutic thorcentesis. EXM: ULTRSOUND GUIDED LEFT THORCENTESIS MEDICTIONS: 1% lidocine 15 mL COMPLICTIONS: None immedite. PROCEDURE: n ultrsound guided thorcentesis ws thoroughly discussed with the ptient nd questions nswered. The benefits, risks, lterntives nd complictions were lso discussed. The ptient understnds nd wishes to proceed with the procedure. Written consent ws obtined. Ultrsound ws performed to loclize nd mrk n dequte pocket of fluid in the left chest. The re ws then prepped nd drped in the norml sterile fshion. 1% Lidocine ws used for locl nesthesi. Under ultrsound guidnce  6 Fr Sfe-T-Centesis ctheter ws introduced. Thorcentesis ws performed. The ctheter ws removed nd  dressing pplied. FINDINGS:  totl of pproximtely 900 mL of cler yellow fluid ws removed. Smples were sent to the lbortory s requested by the clinicl tem. IMPRESSION: Successful ultrsound guided left thorcentesis yielding 900 mL of pleurl fluid. No pneumothorx on post-procedure chest x-ry. Red by: Greth Egle, P-C Electroniclly Signed   By: Mrybelle Killings M.D.   On: 06/26/2018 10:02    Microbiology: Recent Results (from the pst 240 hour(s))  Urine culture     Sttus: None   Collection Time: 06/25/18  9:04 M  Result Vlue Ref Rnge Sttus   Specimen Description   Finl    URINE, CLEN CTCH Performed t [facility], [address] [place], [place] [address]    Specil Requests   Finl    NONE Performed t [facility], [address], [place], [place] [address]    Culture   Finl    NO  GROWTH Performed t [facility], [place] [address], [place], [place] [address]    Report Sttus 06/26/2018 FINL  Finl  SRS Coronvirus 2 (CEPHEID - Performed in [facility]), Hosp Order     Sttus: None   Collection Time: 06/25/18  9:16 M  Result Vlue Ref Rnge Sttus   SRS Coronvirus 2 NEGTIVE NEGTIVE Finl    Comment: (NOTE) If result is NEGTIVE SRS-CoV-2 trget nucleic cids re NOT DETECTED. The SRS-CoV-2 RN is generlly detectble in upper nd lower  respirtory specimens during the cute phse of infection. The lowest  concentrtion of SRS-CoV-2 virl copies this ssy cn detect is 250  copies / mL.  negtive result does not preclude SRS-CoV-2 infection  nd should not be used s the sole bsis for tretment or other  ptient mngement decisions.   negtive result my occur with  improper specimen collection / hndling, submission of specimen other  thn nsophryngel swb, presence of virl muttion(s) within the  res trgeted by this ssy, nd indequte number of virl copies  (<250 copies / mL).  negtive result must be combined with clinicl  observtions, ptient history, nd epidemiologicl informtion. If result is POSITIVE SRS-CoV-2 trget nucleic cids re DETECTED. The SRS-CoV-2 RN is generlly detectble in upper nd lower  respirtory specimens dur ing the cute phse of infection.  Positive  results re indictive of ctive infection with SRS-CoV-2.  Clinicl  correltion with ptient history nd other dignostic informtion is  necessry to determine ptient infection sttus.  Positive results do  not rule out bcteril infection or co-infection with other viruses. If result is PRESUMPTIVE POSTIVE SRS-CoV-2 nucleic cids MY BE PRESENT.    presumptive positive result ws obtined on the submitted specimen  nd confirmed on repet testing.  While 2019 novel coronvirus  (SRS-CoV-2) nucleic cids **Note De-Identified vi Obfusction** my be present in  the submitted smple  dditionl confirmtory testing my be necessry for epidemiologicl  nd / or clinicl mngement purposes  to differentite between  SARS-CoV-2 nd other Srbecovirus currently known to infect humns.  If cliniclly indicted dditionl testing with n lternte test  methodology [phone]) is dvised. The SARS-CoV-2 RNA is generlly  detectble in upper nd lower respirtory sp ecimens during the cute  phse of infection. The expected result is Negtive. Fct Sheet for Ptients:  StrictlyIdes.no Fct Sheet for [place] Providers: BnkingDelers.co.z This test is not yet pproved or clered by the [place] FDA nd hs been uthorized for detection nd/or dignosis of SARS-CoV-2 by FDA under n Emergency Use Authoriztion (EUA).  This EUA will remin in effect (mening this test cn be used) for the durtion of the COVID-19 declrtion under Section 564(b)(1) of the Act, 21 U.S.C. section 360bbb-3(b)(1), unless the uthoriztion is terminted or revoked sooner. Performed t [facility], [address] [place], [place] [address]   Grm stin     Sttus: None   Collection Time: 06/26/18  9:48 AM  Result Vlue Ref Rnge Sttus   Specimen Description PLEURAL LEFT  Finl   Specil Requests NONE  Finl   Grm Stin   Finl    RARE WBC PRESENT, PREDOMINANTLY PMN NO ORGANISMS SEEN Performed t [facility], [place] [address], [place], [place] [address]    Report Sttus 06/26/2018 FINAL  Finl  MRSA PCR Screening     Sttus: None   Collection Time: 06/28/18  3:17 PM  Result Vlue Ref Rnge Sttus   MRSA by PCR NEGATIVE NEGATIVE Finl    Comment:        The GeneXpert MRSA Assy (FDA pproved for NASAL specimens only), is one component of  comprehensive MRSA coloniztion surveillnce progrm. It is not intended to dignose MRSA infection nor to guide or monitor tretment for MRSA  infections. Performed t [facility], [place] [address] [place],  [address]      Lbs: Bsic Metbolic Pnel: Recent Lbs  Lb 06/26/18 0518 06/27/18 0729 06/28/18 1050 06/29/18 0701 06/30/18 0353  NA 135 135 132* 134* 134*  134*  K 4.2 4.5 4.4 4.2 4.1  4.4  CL 102 103 100 101 98  98  CO2 22 23 24 23 26  26   GLUCOSE 168* 102* 100* 103* 115*  113*  BUN 18 27* 23 20 17  17   CREATININE 1.00 1.06 1.10 1.01 0.97  1.02  CALCIUM 9.4 8.8* 8.8* 9.0 9.1  9.2  MG 1.8 1.9 1.6* 1.7 1.8  PHOS  --  2.7 2.5 2.6 3.7  3.7   Liver Function Tests: Recent Lbs  Lb 06/26/18 0518 06/27/18 0729 06/28/18 1050 06/29/18 0701 06/30/18 0353  AST 18 15 14* 16 15  ALT 14 14 14 14 14   ALKPHOS 40 46 48 49 45  BILITOT 0.9 0.8 0.7 1.1 1.0  PROT 8.5* 7.8 7.4 7.6 7.3  ALBUMIN 2.6* 2.5* 2.4* 2.6* 2.4*  2.4*   No results for input(s): LIPASE, AMYLASE in the lst 168 hours. Recent Lbs  Lb 06/29/18 1354  AMMONIA 30   CBC: Recent Lbs  Lb 06/26/18 0518 06/27/18 0729 06/28/18 1050 06/29/18 0701 06/30/18 0353  WBC 13.6* 18.4* 20.1* 23.2* 18.3*  NEUTROABS 11.3* 14.1* 11.7* 18.8* 13.7*  HGB 13.4 13.5 13.4 14.9 13.6  HCT 40.7 41.9 42.5 46.0 41.8  MCV 87.5 90.3 90.8 90.2 89.1  PLT 532* 531* 496* 498* 567*   Cardiac Enzymes: Recent Labs  Lab 06/25/18 0915 06/29/18 1354 06/29/18 1920 06/30/18 0353  TROPONINI <0.03 <0.03 <0.03 <0.03   BNP: BNP (last 3 results) Recent Labs    06/13/18 1402 06/25/18 0915  BNP 47.0 52.0    ProBNP (last 3 results) No results for input(s): PROBNP in the last 8760 hours.  CBG: Recent Labs  Lab 06/27/18 0126 06/29/18 1227  GLUCAP 84 115*       Signed:  Fayrene Helper MD.  [facility] 06/30/2018, 1:03 PM

## 2018-07-01 ENCOUNTER — Ambulatory Visit (HOSPITAL_COMMUNITY): Payer: Medicare Other | Admitting: Hematology

## 2018-07-01 ENCOUNTER — Other Ambulatory Visit (HOSPITAL_COMMUNITY): Payer: Medicare Other

## 2018-07-01 ENCOUNTER — Ambulatory Visit (HOSPITAL_COMMUNITY): Payer: Medicare Other

## 2018-07-01 DIAGNOSIS — Z7982 Long term (current) use of aspirin: Secondary | ICD-10-CM | POA: Diagnosis not present

## 2018-07-01 DIAGNOSIS — C3490 Malignant neoplasm of unspecified part of unspecified bronchus or lung: Secondary | ICD-10-CM | POA: Diagnosis not present

## 2018-07-01 DIAGNOSIS — J189 Pneumonia, unspecified organism: Secondary | ICD-10-CM | POA: Diagnosis not present

## 2018-07-01 DIAGNOSIS — Z96652 Presence of left artificial knee joint: Secondary | ICD-10-CM | POA: Diagnosis not present

## 2018-07-01 DIAGNOSIS — J440 Chronic obstructive pulmonary disease with acute lower respiratory infection: Secondary | ICD-10-CM | POA: Diagnosis not present

## 2018-07-01 DIAGNOSIS — I10 Essential (primary) hypertension: Secondary | ICD-10-CM | POA: Diagnosis not present

## 2018-07-01 DIAGNOSIS — G8929 Other chronic pain: Secondary | ICD-10-CM | POA: Diagnosis not present

## 2018-07-01 DIAGNOSIS — I2510 Atherosclerotic heart disease of native coronary artery without angina pectoris: Secondary | ICD-10-CM | POA: Diagnosis not present

## 2018-07-01 DIAGNOSIS — Z7951 Long term (current) use of inhaled steroids: Secondary | ICD-10-CM | POA: Diagnosis not present

## 2018-07-01 DIAGNOSIS — G709 Myoneural disorder, unspecified: Secondary | ICD-10-CM | POA: Diagnosis not present

## 2018-07-01 DIAGNOSIS — C787 Secondary malignant neoplasm of liver and intrahepatic bile duct: Secondary | ICD-10-CM | POA: Diagnosis not present

## 2018-07-01 DIAGNOSIS — M545 Low back pain: Secondary | ICD-10-CM | POA: Diagnosis not present

## 2018-07-01 DIAGNOSIS — J90 Pleural effusion, not elsewhere classified: Secondary | ICD-10-CM | POA: Diagnosis not present

## 2018-07-01 DIAGNOSIS — Z96641 Presence of right artificial hip joint: Secondary | ICD-10-CM | POA: Diagnosis not present

## 2018-07-01 DIAGNOSIS — Z791 Long term (current) use of non-steroidal anti-inflammatories (NSAID): Secondary | ICD-10-CM | POA: Diagnosis not present

## 2018-07-02 ENCOUNTER — Ambulatory Visit: Payer: Medicare Other | Admitting: Cardiothoracic Surgery

## 2018-07-03 DIAGNOSIS — J440 Chronic obstructive pulmonary disease with acute lower respiratory infection: Secondary | ICD-10-CM | POA: Diagnosis not present

## 2018-07-03 DIAGNOSIS — G709 Myoneural disorder, unspecified: Secondary | ICD-10-CM | POA: Diagnosis not present

## 2018-07-03 DIAGNOSIS — I2510 Atherosclerotic heart disease of native coronary artery without angina pectoris: Secondary | ICD-10-CM | POA: Diagnosis not present

## 2018-07-03 DIAGNOSIS — Z7982 Long term (current) use of aspirin: Secondary | ICD-10-CM | POA: Diagnosis not present

## 2018-07-03 DIAGNOSIS — Z7951 Long term (current) use of inhaled steroids: Secondary | ICD-10-CM | POA: Diagnosis not present

## 2018-07-03 DIAGNOSIS — Z96641 Presence of right artificial hip joint: Secondary | ICD-10-CM | POA: Diagnosis not present

## 2018-07-03 DIAGNOSIS — J189 Pneumonia, unspecified organism: Secondary | ICD-10-CM | POA: Diagnosis not present

## 2018-07-03 DIAGNOSIS — C787 Secondary malignant neoplasm of liver and intrahepatic bile duct: Secondary | ICD-10-CM | POA: Diagnosis not present

## 2018-07-03 DIAGNOSIS — Z96652 Presence of left artificial knee joint: Secondary | ICD-10-CM | POA: Diagnosis not present

## 2018-07-03 DIAGNOSIS — I10 Essential (primary) hypertension: Secondary | ICD-10-CM | POA: Diagnosis not present

## 2018-07-03 DIAGNOSIS — M545 Low back pain: Secondary | ICD-10-CM | POA: Diagnosis not present

## 2018-07-03 DIAGNOSIS — Z791 Long term (current) use of non-steroidal anti-inflammatories (NSAID): Secondary | ICD-10-CM | POA: Diagnosis not present

## 2018-07-03 DIAGNOSIS — G8929 Other chronic pain: Secondary | ICD-10-CM | POA: Diagnosis not present

## 2018-07-03 DIAGNOSIS — C3490 Malignant neoplasm of unspecified part of unspecified bronchus or lung: Secondary | ICD-10-CM | POA: Diagnosis not present

## 2018-07-03 DIAGNOSIS — J90 Pleural effusion, not elsewhere classified: Secondary | ICD-10-CM | POA: Diagnosis not present

## 2018-07-06 ENCOUNTER — Telehealth: Payer: Self-pay

## 2018-07-06 DIAGNOSIS — J440 Chronic obstructive pulmonary disease with acute lower respiratory infection: Secondary | ICD-10-CM | POA: Diagnosis not present

## 2018-07-06 DIAGNOSIS — M25562 Pain in left knee: Secondary | ICD-10-CM | POA: Diagnosis not present

## 2018-07-06 DIAGNOSIS — M25551 Pain in right hip: Secondary | ICD-10-CM | POA: Diagnosis not present

## 2018-07-06 DIAGNOSIS — Z79891 Long term (current) use of opiate analgesic: Secondary | ICD-10-CM | POA: Diagnosis not present

## 2018-07-06 DIAGNOSIS — C3490 Malignant neoplasm of unspecified part of unspecified bronchus or lung: Secondary | ICD-10-CM | POA: Diagnosis not present

## 2018-07-06 DIAGNOSIS — M545 Low back pain: Secondary | ICD-10-CM | POA: Diagnosis not present

## 2018-07-06 DIAGNOSIS — J189 Pneumonia, unspecified organism: Secondary | ICD-10-CM | POA: Diagnosis not present

## 2018-07-06 DIAGNOSIS — I10 Essential (primary) hypertension: Secondary | ICD-10-CM | POA: Diagnosis not present

## 2018-07-06 DIAGNOSIS — Z96641 Presence of right artificial hip joint: Secondary | ICD-10-CM | POA: Diagnosis not present

## 2018-07-06 DIAGNOSIS — G8929 Other chronic pain: Secondary | ICD-10-CM | POA: Diagnosis not present

## 2018-07-06 DIAGNOSIS — G709 Myoneural disorder, unspecified: Secondary | ICD-10-CM | POA: Diagnosis not present

## 2018-07-06 DIAGNOSIS — Z96652 Presence of left artificial knee joint: Secondary | ICD-10-CM | POA: Diagnosis not present

## 2018-07-06 DIAGNOSIS — J449 Chronic obstructive pulmonary disease, unspecified: Secondary | ICD-10-CM | POA: Diagnosis not present

## 2018-07-06 DIAGNOSIS — Z9981 Dependence on supplemental oxygen: Secondary | ICD-10-CM | POA: Diagnosis not present

## 2018-07-06 DIAGNOSIS — I2510 Atherosclerotic heart disease of native coronary artery without angina pectoris: Secondary | ICD-10-CM | POA: Diagnosis not present

## 2018-07-06 DIAGNOSIS — Z7982 Long term (current) use of aspirin: Secondary | ICD-10-CM | POA: Diagnosis not present

## 2018-07-06 DIAGNOSIS — C787 Secondary malignant neoplasm of liver and intrahepatic bile duct: Secondary | ICD-10-CM | POA: Diagnosis not present

## 2018-07-06 DIAGNOSIS — Z7951 Long term (current) use of inhaled steroids: Secondary | ICD-10-CM | POA: Diagnosis not present

## 2018-07-06 DIAGNOSIS — J90 Pleural effusion, not elsewhere classified: Secondary | ICD-10-CM | POA: Diagnosis not present

## 2018-07-06 DIAGNOSIS — Z791 Long term (current) use of non-steroidal anti-inflammatories (NSAID): Secondary | ICD-10-CM | POA: Diagnosis not present

## 2018-07-06 NOTE — Telephone Encounter (Signed)
Patient and daughter, Freda Munro, contacted to introduce Palliative Care and to offer to schedule visit with NP. Reviewed consent form with daughter. Verbal consent obtained. Visit scheduled for Friday 6/12/20202

## 2018-07-08 ENCOUNTER — Encounter (HOSPITAL_COMMUNITY): Payer: Self-pay | Admitting: Hematology

## 2018-07-08 ENCOUNTER — Other Ambulatory Visit: Payer: Self-pay

## 2018-07-08 ENCOUNTER — Inpatient Hospital Stay (HOSPITAL_COMMUNITY): Payer: Medicare Other

## 2018-07-08 ENCOUNTER — Inpatient Hospital Stay (HOSPITAL_COMMUNITY): Payer: Medicare Other | Attending: Hematology | Admitting: Hematology

## 2018-07-08 DIAGNOSIS — Z5112 Encounter for antineoplastic immunotherapy: Secondary | ICD-10-CM | POA: Diagnosis not present

## 2018-07-08 DIAGNOSIS — C3492 Malignant neoplasm of unspecified part of left bronchus or lung: Secondary | ICD-10-CM

## 2018-07-08 DIAGNOSIS — C3432 Malignant neoplasm of lower lobe, left bronchus or lung: Secondary | ICD-10-CM | POA: Insufficient documentation

## 2018-07-08 DIAGNOSIS — C3431 Malignant neoplasm of lower lobe, right bronchus or lung: Secondary | ICD-10-CM

## 2018-07-08 DIAGNOSIS — Z79899 Other long term (current) drug therapy: Secondary | ICD-10-CM

## 2018-07-08 LAB — COMPREHENSIVE METABOLIC PANEL
ALT: 15 U/L (ref 0–44)
AST: 15 U/L (ref 15–41)
Albumin: 3 g/dL — ABNORMAL LOW (ref 3.5–5.0)
Alkaline Phosphatase: 67 U/L (ref 38–126)
Anion gap: 13 (ref 5–15)
BUN: 14 mg/dL (ref 8–23)
CO2: 23 mmol/L (ref 22–32)
Calcium: 8.9 mg/dL (ref 8.9–10.3)
Chloride: 102 mmol/L (ref 98–111)
Creatinine, Ser: 0.86 mg/dL (ref 0.61–1.24)
GFR calc Af Amer: >60 mL/min (ref >60)
GFR calc non Af Amer: >60 mL/min (ref >60)
Glucose, Bld: 106 mg/dL — ABNORMAL HIGH (ref 70–99)
Potassium: 3.6 mmol/L (ref 3.5–5.1)
Sodium: 138 mmol/L (ref 135–145)
Total Bilirubin: 0.9 mg/dL (ref 0.3–1.2)
Total Protein: 8.2 g/dL — ABNORMAL HIGH (ref 6.5–8.1)

## 2018-07-08 LAB — CBC WITH DIFFERENTIAL/PLATELET
Abs Immature Granulocytes: 0.16 10*3/uL — ABNORMAL HIGH (ref 0.00–0.07)
Basophils Absolute: 0.1 10*3/uL (ref 0.0–0.1)
Basophils Relative: 1 %
Eosinophils Absolute: 1 10*3/uL — ABNORMAL HIGH (ref 0.0–0.5)
Eosinophils Relative: 6 %
HCT: 44.3 % (ref 39.0–52.0)
Hemoglobin: 14.2 g/dL (ref 13.0–17.0)
Immature Granulocytes: 1 %
Lymphocytes Relative: 22 %
Lymphs Abs: 3.6 10*3/uL (ref 0.7–4.0)
MCH: 28.8 pg (ref 26.0–34.0)
MCHC: 32.1 g/dL (ref 30.0–36.0)
MCV: 89.9 fL (ref 80.0–100.0)
Monocytes Absolute: 0.7 10*3/uL (ref 0.1–1.0)
Monocytes Relative: 5 %
Neutro Abs: 10.6 10*3/uL — ABNORMAL HIGH (ref 1.7–7.7)
Neutrophils Relative %: 65 %
Platelets: 612 10*3/uL — ABNORMAL HIGH (ref 150–400)
RBC: 4.93 MIL/uL (ref 4.22–5.81)
RDW: 17.6 % — ABNORMAL HIGH (ref 11.5–15.5)
WBC: 16.2 10*3/uL — ABNORMAL HIGH (ref 4.0–10.5)
nRBC: 0 % (ref 0.0–0.2)

## 2018-07-08 LAB — TSH: TSH: 0.711 u[IU]/mL (ref 0.350–4.500)

## 2018-07-08 NOTE — Patient Instructions (Addendum)
[place] at [facility] Discharge Instructions  You were seen today by Dr. Delton Coombes. He went over your recent lab results. He will see you back in 4 days for labs and follow up.   Thank you for choosing [place] at [facility] to provide your oncology and hematology care.  To afford each patient quality time with our provider, please arrive at least 15 minutes before your scheduled appointment time.   If you have a lab appointment with the [facility] please come in thru the  Main Entrance and check in at the main information desk  You need to re-schedule your appointment should you arrive 10 or more minutes late.  We strive to give you quality time with our providers, and arriving late affects you and other patients whose appointments are after yours.  Also, if you no show three or more times for appointments you may be dismissed from the clinic at the providers discretion.     Again, thank you for choosing [facility].  Our hope is that these requests will decrease the amount of time that you wait before being seen by our physicians.       _____________________________________________________________  Should you have questions after your visit to [facility], please contact our office at [phone] between the hours of 8:00 a.m. and 4:30 p.m.  Voicemails left after 4:00 p.m. will not be returned until the following business day.  For prescription refill requests, have your pharmacy contact our office and allow 72 hours.    Cancer Center Support Programs:   > Cancer Support Group  2nd Tuesday of the month 1pm-2pm, Journey Room

## 2018-07-08 NOTE — Assessment & Plan Note (Signed)
1.  Non-small cell lung cancer, adenocarcinoma type: -he was evaluated by me as inpatient when he was hospitalized on 12/29/2017. - Left lower lobe endobronchial biopsy on 12/15/2017 shows non-small cell lung cancer, TTF-1 positive consistent with pulmonary adenocarcinoma. -MRI of the brain on 12/29/2017 did not show any evidence of intracranial metastasis.. -PET/CT scan on 02/16/2018 shows dominant perihilar left lung mass with mildly increased in size.  There is new hypermetabolic nodule in the left upper lobe and new hypermetabolic lesion in the dome of the liver.  Right upper lobe lung nodules have slightly decreased in size.  Similar appearance of hypermetabolic mediastinal and hilar adenopathy.  - Rebiopsy of the liver lesion on 03/17/2018 for molecular studies shows poorly differentiated carcinoma. - Foundation 1 testing shows MS-stable, TMB 20 Muts/Mb, BRAF amplification, MET amplification, MYC and KIT amplification, PDGFRA/SOX2 amplifications.  PD-L1 could not be done because of scant tissue. -Combination immunotherapy with ipilimumab and nivolumab cycle 1 on 04/26/2018, cycle 2 on 06/08/2018. -He had multiple hospitalizations after start of immunotherapy. -Most recent CT scan showed progression compared to PET scan from February 15, 2018.  However patient did not start treatment until 04/26/2018.  We do not have a baseline scan prior to start of therapy. -He had received a left Pleurx catheter on 06/28/2018.  He reported improvement in his breathing and functional capacity.  Reportedly this only put out 50 mL this Monday.  Nurses coming to his house. - We have again discussed at length about further plan including best supportive care in the form of hospice versus continuation of immunotherapy.  He does not want to receive any chemotherapy but would like to continue immunotherapy for 2 more months and repeat scans to see if it works. - I have scheduled him for his next treatment on Monday.  I will see  him back 2 weeks from next treatment.   2.  Left chest wall pain: -This has improved since the Pleurx catheter placement. -He is taking oxycodone 10 mg every 4 hours.

## 2018-07-08 NOTE — Progress Notes (Signed)
[place], [place] [address]   CLINIC:  Medical Oncology/Hematology  PCP:  Jani Gravel, MD [place] [address] [phone]   REASON FOR VISIT:  Follow-up for  Non-small cell lung cancer   BRIEF ONCOLOGIC HISTORY:  Oncology History  Non-small cell carcinoma of left lung ([facility])  12/29/2017 Initial Diagnosis   Non-small cell lung cancer ([facility])   04/26/2018 -  Chemotherapy   The patient had ipilimumab (YERVOY) 75 mg in sodium chloride 0.9 % 50 mL chemo infusion, 1 mg/kg = 75 mg (100 % of original dose 1 mg/kg), Intravenous,  Once, 2 of 3 cycles Dose modification: 1 mg/kg (original dose 1 mg/kg, Cycle 1, Reason: Other (see comments), Comment: per trial) Administration: 75 mg (04/26/2018), 75 mg (06/08/2018) nivolumab (OPDIVO) 220 mg in sodium chloride 0.9 % 100 mL chemo infusion, 3 mg/kg = 220 mg (100 % of original dose 3 mg/kg), Intravenous, Once, 4 of 9 cycles Dose modification: 3 mg/kg (original dose 3 mg/kg, Cycle 1, Reason: Other (see comments), Comment: Per trial) Administration: 220 mg (04/26/2018), 220 mg (06/08/2018), 220 mg (05/10/2018), 220 mg (05/24/2018)  for chemotherapy treatment.       CANCER STAGING: Cancer Staging No matching staging information was found for the patient.   INTERVAL HISTORY:  Bradley [name] 78 y.o. male returns for follow-up of lung cancer.  He had a left Pleurx catheter placed on 06/28/2018.  This has put out less than 50 cc this Monday.  Home health nurse is coming to empty the fluid few days a week.  His last treatment of immunotherapy was on 06/08/2018.  He is taking oxycodone 10 mg every 4 hours for his left-sided chest wall pain.  Overall the pain has improved since the Pleurx catheter placement.  His breathing has also improved.  Denies any fevers or night sweats.  Appetite is 100%.  Energy levels are low.    REVIEW OF SYSTEMS:  Review of Systems  Respiratory: Positive for shortness  of breath. Negative for cough.   Cardiovascular: Positive for chest pain.  Gastrointestinal: Positive for constipation.  Neurological: Negative for numbness.  All other systems reviewed and are negative.    PAST MEDICAL/SURGICAL HISTORY:  Past Medical History:  Diagnosis Date  . Cancer ([facility])    lung  . Chronic lower back pain   . Chronic pain    pain management  . Contact with stonefish as cause of accidental injury 1954   "stung across my left hand in [place]; LUE weaker since"  . COPD (chronic obstructive pulmonary disease) ([facility])   . Coronary artery disease   . Dyspnea    with exertion  . High cholesterol   . History of blood transfusion 1943  . History of kidney stones    passed  . Hypertension   . Mass of left lung   . Migraine    "none since the 1990s" (09/11/2016)  . Myocardial infarction ([facility]) 01/20/1993; 02/28/1993  . Neuromuscular disorder ([facility])    neuropathy left  . On home O2   . Pneumonia    "several times" (09/11/2016)  . Tobacco abuse    ongoing   Past Surgical History:  Procedure Laterality Date  . APPENDECTOMY    . CARDIAC CATHETERIZATION    . CATARACT EXTRACTION, BILATERAL Bilateral   . CHEST TUBE INSERTION Left 06/28/2018   Procedure: INSERTION PLEURAL DRAINAGE CATHETER;  Surgeon: Ivin Poot, MD;  Location: [facility];  Service:  Thoracic;  Laterality: Left;  . COLONOSCOPY    . HIP ARTHROPLASTY Right 12/20/2016   Procedure: ARTHROPLASTY BIPOLAR HIP (HEMIARTHROPLASTY);  Surgeon: [name], MD;  Location: [facility];  Service: Orthopedics;  Laterality: Right;  . IR THORACENTESIS ASP PLEURAL SPACE W/IMG GUIDE  06/15/2018  . JOINT REPLACEMENT    . LEFT HEART CATH AND CORONARY ANGIOGRAPHY N/A 09/12/2016   Procedure: LEFT HEART CATH AND CORONARY ANGIOGRAPHY;  Surgeon: Burnell Blanks, MD;  Location: [facility];  Service: Cardiovascular;  Laterality: N/A;  . NASAL ENDOSCOPY WITH EPISTAXIS CONTROL Right 03/15/2016   Procedure: NASAL ENDOSCOPY WITH  EPISTAXIS CONTROL;  Surgeon: Jodi Marble, MD;  Location: [facility];  Service: ENT;  Laterality: Right;  . PORTACATH PLACEMENT Right 04/23/2018   Procedure: INSERTION PORT-A-CATH;  Surgeon: Aviva Signs, MD;  Location: [facility];  Service: General;  Laterality: Right;  . SEPTOPLASTY Right 03/15/2016   Procedure: SEPTOPLASTY;  Surgeon: Jodi Marble, MD;  Location: [facility];  Service: ENT;  Laterality: Right;  . SINUS EXPLORATION Right 03/15/2016   Procedure: SINUS EXPLORATION;  Surgeon: Jodi Marble, MD;  Location: [facility];  Service: ENT;  Laterality: Right;  . TONSILLECTOMY    . TOOTH EXTRACTION    . TOTAL KNEE ARTHROPLASTY Left   . VIDEO BRONCHOSCOPY WITH ENDOBRONCHIAL ULTRASOUND N/A 12/15/2017   Procedure: VIDEO BRONCHOSCOPY WITH ENDOBRONCHIAL ULTRASOUND;  Surgeon: Rigoberto Noel, MD;  Location: [facility];  Service: Thoracic;  Laterality: N/A;     SOCIAL HISTORY:  Social History   Socioeconomic History  . Marital status: Widowed    Spouse name: Not on file  . Number of children: Not on file  . Years of education: Not on file  . Highest education level: Not on file  Occupational History  . Not on file  Social Needs  . Financial resource strain: Not on file  . Food insecurity    Worry: Not on file    Inability: Not on file  . Transportation needs    Medical: Not on file    Non-medical: Not on file  Tobacco Use  . Smoking status: Current Every Day Smoker    Packs/day: 1.00    Years: 70.00    Pack years: 70.00    Types: Cigarettes  . Smokeless tobacco: Never Used  . Tobacco comment: smokes 8 a day  Substance and Sexual Activity  . Alcohol use: No    Comment: 11/26/2017  "drank from the age of 8 til 03/05/1993"  . Drug use: Not Currently    Comment: 11/26/2017 "used some drugs when I was young"  . Sexual activity: Not Currently  Lifestyle  . Physical activity    Days per week: Not on file    Minutes per session: Not on file  . Stress: Not on file  Relationships  . Social Product manager on phone: Not on file    Gets together: Not on file    Attends religious service: Not on file    Active member of club or organization: Not on file    Attends meetings of clubs or organizations: Not on file    Relationship status: Not on file  . Intimate partner violence    Fear of current or ex partner: Not on file    Emotionally abused: Not on file    Physically abused: Not on file    Forced sexual activity: Not on file  Other Topics Concern  . Not on file  Social History  Narrative  . Not on file    FAMILY HISTORY:  Family History  Adopted: Yes  Problem Relation Age of Onset  . Cancer Mother   . Obesity Father     CURRENT MEDICATIONS:  Outpatient Encounter Medications as of 07/08/2018  Medication Sig  . ADVAIR HFA 115-21 MCG/ACT inhaler Inhale 2 puffs into the lungs every 12 (twelve) hours.  [name] amLODipine (NORVASC) 5 MG tablet TAKE 1 TABLET BY MOUTH EVERY DAY (Patient taking differently: Take 5 mg by mouth daily. )  . amoxicillin-clavulanate (AUGMENTIN) 875-125 MG tablet Take 1 tablet by mouth 2 (two) times daily for 10 days.  . Ascorbic Acid (VITAMIN C) 500 MG CAPS Take 500 mg by mouth daily.  [name] aspirin EC 81 MG tablet Take 81 mg by mouth daily.   . cyclobenzaprine (FLEXERIL) 5 MG tablet Take 5 mg by mouth 3 (three) times daily.   . Ferrous Sulfate (IRON) 325 (65 Fe) MG TABS Take 1 tablet by mouth daily.  . fluticasone (FLONASE) 50 MCG/ACT nasal spray Place 2 sprays into both nostrils daily.  [name] gabapentin (NEURONTIN) 300 MG capsule Take 1 capsule by mouth 3 (three) times daily.  [name] Ipilimumab (YERVOY IV) Inject into the vein every 6 (six) weeks.  [name] ipratropium-albuterol (DUONEB) 0.5-2.5 (3) MG/3ML SOLN Take 3 mLs by nebulization 3 (three) times daily.  [name] lidocaine-prilocaine (EMLA) cream Apply 1 application topically as needed. Apply a small amount to port site and cover with plastic wrap 1 hour prior to infusion appointments  . lisinopril (ZESTRIL) 20 MG tablet Take  1 tablet (20 mg total) by mouth daily.  . metoCLOPramide (REGLAN) 5 MG tablet Take 1 tablet by mouth 4 (four) times daily -  before meals and at bedtime.  . metoprolol succinate (TOPROL-XL) 25 MG 24 hr tablet Take 25 mg by mouth daily.  . montelukast (SINGULAIR) 10 MG tablet Take 1 tablet by mouth daily.  . Multiple Vitamins-Minerals (MULTIVITAMIN WITH MINERALS) tablet Take 1 tablet by mouth daily.  [name] MYRBETRIQ 50 MG TB24 tablet Take 1 tablet by mouth daily.  . nitroGLYCERIN (NITROSTAT) 0.4 MG SL tablet Place 0.4 mg under the tongue every 5 (five) minutes as needed for chest pain.  . Nivolumab (OPDIVO IV) Inject into the vein every 14 (fourteen) days.  . Oxycodone HCl 10 MG TABS Take 1 tablet by mouth every 6 (six) hours.  . polyethylene glycol (MIRALAX / GLYCOLAX) 17 g packet Take 17 g by mouth daily as needed for mild constipation.  . potassium gluconate (HM POTASSIUM) 595 (99 K) MG TABS tablet Take 595 mg by mouth daily.  [name] PRILOSEC 40 MG capsule Take 1 capsule by mouth daily.  [name] senna (SENOKOT) 8.6 MG TABS tablet Take 1 tablet (8.6 mg total) by mouth daily.  . simvastatin (ZOCOR) 10 MG tablet Take 10 mg by mouth at bedtime.   . VENTOLIN HFA 108 (90 Base) MCG/ACT inhaler Inhale 2 puffs into the lungs every 4 (four) hours as needed for up to 30 days for wheezing or shortness of breath.   No facility-administered encounter medications on file as of 07/08/2018.     ALLERGIES:  Allergies  Allergen Reactions  . Aspirin     Regular asa      PHYSICAL EXAM:  ECOG Performance status: 1  Vitals:   07/08/18 0855  BP: 123/65  Pulse: 86  Resp: 18  Temp: 97.6 F (36.4 C)  SpO2: 93%   Filed Weights   07/08/18 0855  Weight: 150  lb 14.4 oz (68.4 kg)    Physical Exam Vitals signs reviewed.  Constitutional:      Appearance: Normal appearance.  Cardiovascular:     Rate and Rhythm: Normal rate and regular rhythm.     Heart sounds: Normal heart sounds.  Pulmonary:     Effort:  Pulmonary effort is normal.     Comments: Decreased breath sounds at the left lung base. Abdominal:     General: There is no distension.     Palpations: Abdomen is soft. There is no mass.  Musculoskeletal:        General: No swelling.  Skin:    General: Skin is warm.  Neurological:     General: No focal deficit present.     Mental Status: He is alert and oriented to person, place, and time.  Psychiatric:        Mood and Affect: Mood normal.        Behavior: Behavior normal.      LABORATORY DATA:  I have reviewed the labs as listed.  CBC    Component Value Date/Time   WBC 16.2 (H) 07/08/2018 0835   RBC 4.93 07/08/2018 0835   HGB 14.2 07/08/2018 0835   HCT 44.3 07/08/2018 0835   PLT 612 (H) 07/08/2018 0835   MCV 89.9 07/08/2018 0835   MCH 28.8 07/08/2018 0835   MCHC 32.1 07/08/2018 0835   RDW 17.6 (H) 07/08/2018 0835   LYMPHSABS 3.6 07/08/2018 0835   MONOABS 0.7 07/08/2018 0835   EOSABS 1.0 (H) 07/08/2018 0835   BASOSABS 0.1 07/08/2018 0835   CMP Latest Ref Rng & Units 07/08/2018 06/30/2018 06/30/2018  Glucose 70 - 99 mg/dL 106(H) 115(H) 113(H)  BUN 8 - 23 mg/dL _0 Creatinine 0.61 - 1.24 mg/dL 0.86 0.97 1.02  Sodium 135 - 145 mmol/L 138 134(L) 134(L)  Potassium 3.5 - 5.1 mmol/L 3.6 4.1 4.4  Chloride 98 - 111 mmol/L 102 98 98  CO2 22 - 32 mmol/L _1 Calcium 8.9 - 10.3 mg/dL 8.9 9.1 9.2  Total Protein 6.5 - 8.1 g/dL 8.2(H) 7.3 -  Total Bilirubin 0.3 - 1.2 mg/dL 0.9 1.0 -  Alkaline Phos 38 - 126 U/L 67 45 -  AST 15 - 41 U/L 15 15 -  ALT 0 - 44 U/L 15 14 -       DIAGNOSTIC IMAGING:  I have independently reviewed the scans and discussed with the patient.     ASSESSMENT & PLAN:   Non-small cell carcinoma of left lung ([facility]) 1.  Non-small cell lung cancer, adenocarcinoma type: -he was evaluated by me as inpatient when he was hospitalized on 12/29/2017. - Left lower lobe endobronchial biopsy on 12/15/2017 shows non-small cell lung cancer, TTF-1 positive  consistent with pulmonary adenocarcinoma. -MRI of the brain on 12/29/2017 did not show any evidence of intracranial metastasis.. -PET/CT scan on 02/16/2018 shows dominant perihilar left lung mass with mildly increased in size.  There is new hypermetabolic nodule in the left upper lobe and new hypermetabolic lesion in the dome of the liver.  Right upper lobe lung nodules have slightly decreased in size.  Similar appearance of hypermetabolic mediastinal and hilar adenopathy.  - Rebiopsy of the liver lesion on 03/17/2018 for molecular studies shows poorly differentiated carcinoma. - Foundation 1 testing shows MS-stable, TMB 20 Muts/Mb, BRAF amplification, MET amplification, MYC and KIT amplification, PDGFRA/SOX2 amplifications.  PD-L1 could not be done because of scant tissue. -Combination immunotherapy with ipilimumab and nivolumab cycle  1 on 04/26/2018, cycle 2 on 06/08/2018. -He had multiple hospitalizations after start of immunotherapy. -Most recent CT scan showed progression compared to PET scan from February 15, 2018.  However patient did not start treatment until 04/26/2018.  We do not have a baseline scan prior to start of therapy. -He had received a left Pleurx catheter on 06/28/2018.  He reported improvement in his breathing and functional capacity.  Reportedly this only put out 50 mL this Monday.  Nurses coming to his house. - We have again discussed at length about further plan including best supportive care in the form of hospice versus continuation of immunotherapy.  He does not want to receive any chemotherapy but would like to continue immunotherapy for 2 more months and repeat scans to see if it works. - I have scheduled him for his next treatment on Monday.  I will see him back 2 weeks from next treatment.   2.  Left chest wall pain: -This has improved since the Pleurx catheter placement. -He is taking oxycodone 10 mg every 4 hours.     Total time spent is 25 minutes with more than 50% of  the time spent face-to-face discussing treatment plan, pain management and coordination of care.  Orders placed this encounter:  No orders of the defined types were placed in this encounter.     Derek Jack, MD [place] [phone]

## 2018-07-09 ENCOUNTER — Other Ambulatory Visit: Payer: Medicare Other | Admitting: Internal Medicine

## 2018-07-09 ENCOUNTER — Other Ambulatory Visit: Payer: Self-pay

## 2018-07-09 DIAGNOSIS — Z515 Encounter for palliative care: Secondary | ICD-10-CM

## 2018-07-09 NOTE — Progress Notes (Addendum)
June 12th, 2020 [facility] Note Telephone: [phone]  Fax: [phone]  PATIENT NAME: Bradley [name] DOB: January 25, 1941 MRN: [id number]  PRIMARY CARE PROVIDER:   Jani Gravel, MD  Derek Jack [facility])  REFERRING PROVIDER:  Jani Gravel, MD [place],  [place] [address]  RESPONSIBLE PARTY: *(dtr) Babette Relic [phone]. 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 He is very hard of hearing; hearing aids help. He ambulates about the home with occasional use of walker or cane within the home. He is independent in all ADLs. He is only rarely dizzy and doesn't feel unsteady on his feet. Knows to use walker if that occurs. Last fell about 1 year ago when he tripped. He continues to smoke about 1/3 to 1 ppd (down from 4 ppd in the past) for the last 70 years. Has no desire to quit as "it's too late". Wears O2 at 3 L most of the time. He is well aware of dangers of smoking while wearing oxygen. He is dyspneic with conversation if he isn't wearing his oxygen. He doesn't use oxygen when traveling outside the home as he finds his portable tanks too heavy to lug about, and he can't afford a portable air compressor / insurance won't cover. He mentioned his PCP is trying to obtain a portable device for him.  He gives out (d/t dyspnea) after walking about 200 feet. Left pleural tube in place. His weight has been stable at about 145 lbs. At 6' his BMI is 19.7 kg/m2. He is continent of bowel and bladder. Moves bowels qd; episodically q 3-4 days. He has constant severe back, knee, and hip pain; his prn oxycodone (averages 6/d) and gabapentin will bring it down to a moderate level that he can tolerate. The oxycodone also assists with his dyspnea. Sleeps through the night about half the time ("just  can't sleep"); doesn't nap during the day. No residual daytime somnolence. He has a R upper chest P-A-C through which he gets his infusions. He has been tolerating immunotherapy reasonably well.   2. Family/Social supports: Patient reports he anticipates 2 more months of immunotherapy treatments, then a restaging scan. If no improvement he will not pursue any more treatments and would actively seek hospice referral. He derives his main source of support from his daughter Babette Relic and [name]. Jolaine Click, along with Roberts Gaudy little 2 yo daughter Duanne Moron live in the home. The ladies have relocated to the house to provide care for the patient as needed. Patient puts his faith in God; he rejoined the [employer] and his church family call and look in on him frequently. His only worry is about how his other daughter who recently (4 days ago) moved out of the home with patients granddaughter (age 38), will cope with his passing. He dearly misses his granddaughter with whom he was very close. They now live about 2 hr drive away. Patient has been married 3 times before; not close to his ex-wives. His heritage is [place] and [place]. Brother in [place] with whom he is not particularly close. He is a proud member of [employer] for 30 years. Patient has a little Insurance risk surveyor ([place]) who is a constant companion. Patient worked as a Games developer, then [employer]. Patient has a visiting nurse to assist  with management of his pleural vac (Shelia unable to learn as handling causes her to gag).   3. Advanced Care Directives: DNR and MOST form in home, tacked up over his bed. MOST details (12/30/2017): DNR/DNI. Limited scope of medical interventions. Antibiotics and IVFs if indicated. Feeding tube for a defined period of time. Both forms uploaded into EPIC EMR ([employer]).  -LCSW Jasmine Awe for community support  4. Goals of Care: Enjoy his family.  5. Follow up  Palliative Care Visit: Call to schedule week of July 12th.  I spent 60 minutes providing this consultation,  from 10 am to 11 am. More than 50% of the time in this consultation was spent coordinating communication.   HISTORY OF PRESENT ILLNESS:  Bradley [name] is a 78 y.o. male with h/o metastatic lung cancer (diagnosed 12/2017; chemo. L Pleurx catheter 06/28/2018), chronic lower back pain, COPD, CAD ([place]), HLD, kidney stones (passed), and HTN. Palliative Care was asked to help address goals of care.  PPS: 50%  HOSPICE ELIGIBILITY/DIAGNOSIS: yes, pending response of disease to nxt 2 month of immunotherapy   PAST MEDICAL HISTORY:  Past Medical History:  Diagnosis Date   Cancer ([facility])    lung   Chronic lower back pain    Chronic pain    pain management   Contact with stonefish as cause of accidental injury 1954   "stung across my left hand in [place]; LUE weaker since"   COPD (chronic obstructive pulmonary disease) ([facility])    Coronary artery disease    Dyspnea    with exertion   High cholesterol    History of blood transfusion 1943   History of kidney stones    passed   Hypertension    Mass of left lung    Migraine    "none since the 1990s" (09/11/2016)   Myocardial infarction ([facility]) 01/20/1993; 02/28/1993   Neuromuscular disorder ([facility])    neuropathy left   On home O2    Pneumonia    "several times" (09/11/2016)   Tobacco abuse    ongoing    SOCIAL HX:  Social History   Tobacco Use   Smoking status: Current Every Day Smoker    Packs/day: 1.00    Years: 70.00    Pack years: 70.00    Types: Cigarettes   Smokeless tobacco: Never Used   Tobacco comment: smokes 8 a day  Substance Use Topics   Alcohol use: No    Comment: 11/26/2017  "drank from the age of 68 til 03/05/1993"    ALLERGIES:  Allergies  Allergen Reactions   Aspirin     Regular asa      PERTINENT MEDICATIONS:  Outpatient Encounter Medications as of 07/09/2018  Medication Sig   ADVAIR HFA  115-21 MCG/ACT inhaler Inhale 2 puffs into the lungs every 12 (twelve) hours.   amLODipine (NORVASC) 5 MG tablet TAKE 1 TABLET BY MOUTH EVERY DAY (Patient taking differently: Take 5 mg by mouth daily. )   amoxicillin-clavulanate (AUGMENTIN) 875-125 MG tablet Take 1 tablet by mouth 2 (two) times daily for 10 days.   Ascorbic Acid (VITAMIN C) 500 MG CAPS Take 500 mg by mouth daily.   aspirin EC 81 MG tablet Take 81 mg by mouth daily.    cyclobenzaprine (FLEXERIL) 5 MG tablet Take 5 mg by mouth 3 (three) times daily.    Ferrous Sulfate (IRON) 325 (65 Fe) MG TABS Take 1 tablet by mouth daily.   fluticasone (FLONASE) 50 MCG/ACT nasal spray Place 2 sprays into  both nostrils daily.   gabapentin (NEURONTIN) 300 MG capsule Take 1 capsule by mouth 3 (three) times daily.   Ipilimumab (YERVOY IV) Inject into the vein every 6 (six) weeks.   ipratropium-albuterol (DUONEB) 0.5-2.5 (3) MG/3ML SOLN Take 3 mLs by nebulization 3 (three) times daily.   lidocaine-prilocaine (EMLA) cream Apply 1 application topically as needed. Apply a small amount to port site and cover with plastic wrap 1 hour prior to infusion appointments   lisinopril (ZESTRIL) 20 MG tablet Take 1 tablet (20 mg total) by mouth daily.   metoCLOPramide (REGLAN) 5 MG tablet Take 1 tablet by mouth 4 (four) times daily -  before meals and at bedtime.   metoprolol succinate (TOPROL-XL) 25 MG 24 hr tablet Take 25 mg by mouth daily.   montelukast (SINGULAIR) 10 MG tablet Take 1 tablet by mouth daily.   Multiple Vitamins-Minerals (MULTIVITAMIN WITH MINERALS) tablet Take 1 tablet by mouth daily.   MYRBETRIQ 50 MG TB24 tablet Take 1 tablet by mouth daily.   nitroGLYCERIN (NITROSTAT) 0.4 MG SL tablet Place 0.4 mg under the tongue every 5 (five) minutes as needed for chest pain.   Nivolumab (OPDIVO IV) Inject into the vein every 14 (fourteen) days.   Oxycodone HCl 10 MG TABS Take 1 tablet by mouth every 6 (six) hours.   polyethylene  glycol (MIRALAX / GLYCOLAX) 17 g packet Take 17 g by mouth daily as needed for mild constipation.   potassium gluconate (HM POTASSIUM) 595 (99 K) MG TABS tablet Take 595 mg by mouth daily.   PRILOSEC 40 MG capsule Take 1 capsule by mouth daily.   senna (SENOKOT) 8.6 MG TABS tablet Take 1 tablet (8.6 mg total) by mouth daily.   simvastatin (ZOCOR) 10 MG tablet Take 10 mg by mouth at bedtime.    VENTOLIN HFA 108 (90 Base) MCG/ACT inhaler Inhale 2 puffs into the lungs every 4 (four) hours as needed for up to 30 days for wheezing or shortness of breath.   No facility-administered encounter medications on file as of 07/09/2018.     PHYSICAL EXAM:   General: NAD, frail appearing, slender. Alert and pleasantly conversant. Daughter, god daughter present. Limited PE d/t COVID-19 social distancing Extremities: no edema, no joint deformities Skin: no rashes Neurological: Weakness but otherwise nonfocal  Julianne Handler, NP

## 2018-07-10 DIAGNOSIS — M545 Low back pain: Secondary | ICD-10-CM | POA: Diagnosis not present

## 2018-07-10 DIAGNOSIS — Z96652 Presence of left artificial knee joint: Secondary | ICD-10-CM | POA: Diagnosis not present

## 2018-07-10 DIAGNOSIS — I2510 Atherosclerotic heart disease of native coronary artery without angina pectoris: Secondary | ICD-10-CM | POA: Diagnosis not present

## 2018-07-10 DIAGNOSIS — Z7951 Long term (current) use of inhaled steroids: Secondary | ICD-10-CM | POA: Diagnosis not present

## 2018-07-10 DIAGNOSIS — Z7982 Long term (current) use of aspirin: Secondary | ICD-10-CM | POA: Diagnosis not present

## 2018-07-10 DIAGNOSIS — J440 Chronic obstructive pulmonary disease with acute lower respiratory infection: Secondary | ICD-10-CM | POA: Diagnosis not present

## 2018-07-10 DIAGNOSIS — I10 Essential (primary) hypertension: Secondary | ICD-10-CM | POA: Diagnosis not present

## 2018-07-10 DIAGNOSIS — G8929 Other chronic pain: Secondary | ICD-10-CM | POA: Diagnosis not present

## 2018-07-10 DIAGNOSIS — G709 Myoneural disorder, unspecified: Secondary | ICD-10-CM | POA: Diagnosis not present

## 2018-07-10 DIAGNOSIS — Z96641 Presence of right artificial hip joint: Secondary | ICD-10-CM | POA: Diagnosis not present

## 2018-07-10 DIAGNOSIS — J90 Pleural effusion, not elsewhere classified: Secondary | ICD-10-CM | POA: Diagnosis not present

## 2018-07-10 DIAGNOSIS — C3490 Malignant neoplasm of unspecified part of unspecified bronchus or lung: Secondary | ICD-10-CM | POA: Diagnosis not present

## 2018-07-10 DIAGNOSIS — J189 Pneumonia, unspecified organism: Secondary | ICD-10-CM | POA: Diagnosis not present

## 2018-07-10 DIAGNOSIS — C787 Secondary malignant neoplasm of liver and intrahepatic bile duct: Secondary | ICD-10-CM | POA: Diagnosis not present

## 2018-07-10 DIAGNOSIS — Z791 Long term (current) use of non-steroidal anti-inflammatories (NSAID): Secondary | ICD-10-CM | POA: Diagnosis not present

## 2018-07-11 DIAGNOSIS — J189 Pneumonia, unspecified organism: Secondary | ICD-10-CM | POA: Diagnosis not present

## 2018-07-11 DIAGNOSIS — R0602 Shortness of breath: Secondary | ICD-10-CM | POA: Diagnosis not present

## 2018-07-11 DIAGNOSIS — J438 Other emphysema: Secondary | ICD-10-CM | POA: Diagnosis not present

## 2018-07-11 DIAGNOSIS — S72009A Fracture of unspecified part of neck of unspecified femur, initial encounter for closed fracture: Secondary | ICD-10-CM | POA: Diagnosis not present

## 2018-07-12 ENCOUNTER — Inpatient Hospital Stay (HOSPITAL_COMMUNITY): Payer: Medicare Other

## 2018-07-12 ENCOUNTER — Other Ambulatory Visit: Payer: Self-pay

## 2018-07-12 VITALS — BP 126/60 | HR 68 | Temp 97.9°F | Resp 18 | Wt 155.0 lb

## 2018-07-12 DIAGNOSIS — C3492 Malignant neoplasm of unspecified part of left bronchus or lung: Secondary | ICD-10-CM

## 2018-07-12 DIAGNOSIS — Z5112 Encounter for antineoplastic immunotherapy: Secondary | ICD-10-CM | POA: Diagnosis not present

## 2018-07-12 DIAGNOSIS — Z79899 Other long term (current) drug therapy: Secondary | ICD-10-CM | POA: Diagnosis not present

## 2018-07-12 DIAGNOSIS — C3432 Malignant neoplasm of lower lobe, left bronchus or lung: Secondary | ICD-10-CM | POA: Diagnosis not present

## 2018-07-12 MED ORDER — SODIUM CHLORIDE 0.9 % IV SOLN
Freq: Once | INTRAVENOUS | Status: AC
  Administered 2018-07-12: 14:00:00 via INTRAVENOUS

## 2018-07-12 MED ORDER — SODIUM CHLORIDE 0.9% FLUSH
10.0000 mL | INTRAVENOUS | Status: DC | PRN
  Administered 2018-07-12: 10 mL
  Filled 2018-07-12: qty 10

## 2018-07-12 MED ORDER — SODIUM CHLORIDE 0.9 % IV SOLN
3.0000 mg/kg | Freq: Once | INTRAVENOUS | Status: AC
  Administered 2018-07-12: 220 mg via INTRAVENOUS
  Filled 2018-07-12: qty 12

## 2018-07-12 MED ORDER — HEPARIN SOD (PORK) LOCK FLUSH 100 UNIT/ML IV SOLN
500.0000 [IU] | Freq: Once | INTRAVENOUS | Status: AC | PRN
  Administered 2018-07-12: 500 [IU]

## 2018-07-12 NOTE — Patient Instructions (Signed)
[facility] Discharge Instructions for Patients Receiving Chemotherapy  Today you received the following chemotherapy agents   To help prevent nausea and vomiting after your treatment, we encourage you to take your nausea medication   If you develop nausea and vomiting that is not controlled by your nausea medication, call the clinic.   BELOW ARE SYMPTOMS THAT SHOULD BE REPORTED IMMEDIATELY:  *FEVER GREATER THAN 100.5 F  *CHILLS WITH OR WITHOUT FEVER  NAUSEA AND VOMITING THAT IS NOT CONTROLLED WITH YOUR NAUSEA MEDICATION  *UNUSUAL SHORTNESS OF BREATH  *UNUSUAL BRUISING OR BLEEDING  TENDERNESS IN MOUTH AND THROAT WITH OR WITHOUT PRESENCE OF ULCERS  *URINARY PROBLEMS  *BOWEL PROBLEMS  UNUSUAL RASH Items with * indicate a potential emergency and should be followed up as soon as possible.  Feel free to call the clinic should you have any questions or concerns. The clinic phone number is [phone].  Please show the CHEMO ALERT CARD at check-in to the Emergency Department and triage nurse.   

## 2018-07-12 NOTE — Progress Notes (Signed)
No new issues reported by patient today. Proceed with treatment per protocol. Labs done on 07-08-2018  Treatment given per orders. Patient tolerated it well without problems. Vitals stable and discharged home from clinic via wheelchair. Follow up as scheduled.

## 2018-07-13 MED ORDER — LANREOTIDE ACETATE 120 MG/0.5ML ~~LOC~~ SOLN
SUBCUTANEOUS | Status: AC
  Filled 2018-07-13: qty 120

## 2018-07-14 DIAGNOSIS — Z96641 Presence of right artificial hip joint: Secondary | ICD-10-CM | POA: Diagnosis not present

## 2018-07-14 DIAGNOSIS — J189 Pneumonia, unspecified organism: Secondary | ICD-10-CM | POA: Diagnosis not present

## 2018-07-14 DIAGNOSIS — Z7982 Long term (current) use of aspirin: Secondary | ICD-10-CM | POA: Diagnosis not present

## 2018-07-14 DIAGNOSIS — Z96652 Presence of left artificial knee joint: Secondary | ICD-10-CM | POA: Diagnosis not present

## 2018-07-14 DIAGNOSIS — C787 Secondary malignant neoplasm of liver and intrahepatic bile duct: Secondary | ICD-10-CM | POA: Diagnosis not present

## 2018-07-14 DIAGNOSIS — J90 Pleural effusion, not elsewhere classified: Secondary | ICD-10-CM | POA: Diagnosis not present

## 2018-07-14 DIAGNOSIS — I2510 Atherosclerotic heart disease of native coronary artery without angina pectoris: Secondary | ICD-10-CM | POA: Diagnosis not present

## 2018-07-14 DIAGNOSIS — M545 Low back pain: Secondary | ICD-10-CM | POA: Diagnosis not present

## 2018-07-14 DIAGNOSIS — Z791 Long term (current) use of non-steroidal anti-inflammatories (NSAID): Secondary | ICD-10-CM | POA: Diagnosis not present

## 2018-07-14 DIAGNOSIS — Z7951 Long term (current) use of inhaled steroids: Secondary | ICD-10-CM | POA: Diagnosis not present

## 2018-07-14 DIAGNOSIS — G8929 Other chronic pain: Secondary | ICD-10-CM | POA: Diagnosis not present

## 2018-07-14 DIAGNOSIS — G709 Myoneural disorder, unspecified: Secondary | ICD-10-CM | POA: Diagnosis not present

## 2018-07-14 DIAGNOSIS — J440 Chronic obstructive pulmonary disease with acute lower respiratory infection: Secondary | ICD-10-CM | POA: Diagnosis not present

## 2018-07-14 DIAGNOSIS — C3490 Malignant neoplasm of unspecified part of unspecified bronchus or lung: Secondary | ICD-10-CM | POA: Diagnosis not present

## 2018-07-14 DIAGNOSIS — I10 Essential (primary) hypertension: Secondary | ICD-10-CM | POA: Diagnosis not present

## 2018-07-17 DIAGNOSIS — J189 Pneumonia, unspecified organism: Secondary | ICD-10-CM | POA: Diagnosis not present

## 2018-07-17 DIAGNOSIS — J438 Other emphysema: Secondary | ICD-10-CM | POA: Diagnosis not present

## 2018-07-17 DIAGNOSIS — S72009A Fracture of unspecified part of neck of unspecified femur, initial encounter for closed fracture: Secondary | ICD-10-CM | POA: Diagnosis not present

## 2018-07-18 DIAGNOSIS — J189 Pneumonia, unspecified organism: Secondary | ICD-10-CM | POA: Diagnosis not present

## 2018-07-18 DIAGNOSIS — Z96652 Presence of left artificial knee joint: Secondary | ICD-10-CM | POA: Diagnosis not present

## 2018-07-18 DIAGNOSIS — J90 Pleural effusion, not elsewhere classified: Secondary | ICD-10-CM | POA: Diagnosis not present

## 2018-07-18 DIAGNOSIS — I2510 Atherosclerotic heart disease of native coronary artery without angina pectoris: Secondary | ICD-10-CM | POA: Diagnosis not present

## 2018-07-18 DIAGNOSIS — G8929 Other chronic pain: Secondary | ICD-10-CM | POA: Diagnosis not present

## 2018-07-18 DIAGNOSIS — M545 Low back pain: Secondary | ICD-10-CM | POA: Diagnosis not present

## 2018-07-18 DIAGNOSIS — I10 Essential (primary) hypertension: Secondary | ICD-10-CM | POA: Diagnosis not present

## 2018-07-18 DIAGNOSIS — Z7982 Long term (current) use of aspirin: Secondary | ICD-10-CM | POA: Diagnosis not present

## 2018-07-18 DIAGNOSIS — Z7951 Long term (current) use of inhaled steroids: Secondary | ICD-10-CM | POA: Diagnosis not present

## 2018-07-18 DIAGNOSIS — Z791 Long term (current) use of non-steroidal anti-inflammatories (NSAID): Secondary | ICD-10-CM | POA: Diagnosis not present

## 2018-07-18 DIAGNOSIS — C787 Secondary malignant neoplasm of liver and intrahepatic bile duct: Secondary | ICD-10-CM | POA: Diagnosis not present

## 2018-07-18 DIAGNOSIS — G709 Myoneural disorder, unspecified: Secondary | ICD-10-CM | POA: Diagnosis not present

## 2018-07-18 DIAGNOSIS — Z96641 Presence of right artificial hip joint: Secondary | ICD-10-CM | POA: Diagnosis not present

## 2018-07-18 DIAGNOSIS — C3490 Malignant neoplasm of unspecified part of unspecified bronchus or lung: Secondary | ICD-10-CM | POA: Diagnosis not present

## 2018-07-18 DIAGNOSIS — J440 Chronic obstructive pulmonary disease with acute lower respiratory infection: Secondary | ICD-10-CM | POA: Diagnosis not present

## 2018-07-19 ENCOUNTER — Encounter: Payer: Self-pay | Admitting: Internal Medicine

## 2018-07-22 ENCOUNTER — Other Ambulatory Visit: Payer: Self-pay | Admitting: Cardiothoracic Surgery

## 2018-07-22 DIAGNOSIS — C3492 Malignant neoplasm of unspecified part of left bronchus or lung: Secondary | ICD-10-CM

## 2018-07-23 ENCOUNTER — Ambulatory Visit
Admission: RE | Admit: 2018-07-23 | Discharge: 2018-07-23 | Disposition: A | Discharge status: Home / Self Care | Payer: Medicare Other | Source: Ambulatory Visit | Attending: Cardiothoracic Surgery | Admitting: Cardiothoracic Surgery

## 2018-07-23 ENCOUNTER — Encounter: Payer: Self-pay | Admitting: Cardiothoracic Surgery

## 2018-07-23 ENCOUNTER — Other Ambulatory Visit: Payer: Self-pay

## 2018-07-23 ENCOUNTER — Ambulatory Visit (INDEPENDENT_AMBULATORY_CARE_PROVIDER_SITE_OTHER): Payer: Medicare Other | Admitting: Cardiothoracic Surgery

## 2018-07-23 VITALS — BP 128/75 | HR 87 | Temp 97.9°F | Resp 20 | Ht 72.0 in | Wt 155.0 lb

## 2018-07-23 DIAGNOSIS — J90 Pleural effusion, not elsewhere classified: Secondary | ICD-10-CM | POA: Diagnosis not present

## 2018-07-23 DIAGNOSIS — C3492 Malignant neoplasm of unspecified part of left bronchus or lung: Secondary | ICD-10-CM

## 2018-07-23 DIAGNOSIS — R0602 Shortness of breath: Secondary | ICD-10-CM | POA: Diagnosis not present

## 2018-07-23 DIAGNOSIS — R05 Cough: Secondary | ICD-10-CM | POA: Diagnosis not present

## 2018-07-23 NOTE — Progress Notes (Signed)
PCP is Jani Gravel, MD Referring Provider is Elodia Florence.,*  Chief Complaint  Patient presents with  . Routine Post Op    f/u with CXR, left pleural effusion, currently draining every 4 days.average amounts have been less than 50cc's     HPI: 1 month Pleurx check for malignant effusion, left side.  Patient receiving chemotherapy with decreased amount of pleural drainage.  Not draining only 1-2 ounces at a time.  Will reduce drainage schedule to once a week on Mondays.  Chest x-ray today shows no significant left pleural effusion.  Pleurx catheter in good position.  Incision sutures removed today and Pleurx catheter dressing changed personally.  Exit site is clean and dry.   Past Medical History:  Diagnosis Date  . Cancer ([place])    lung  . Chronic lower back pain   . Chronic pain    pain management  . Contact with stonefish as cause of accidental injury 1954   "stung across my left hand in [place]; LUE weaker since"  . COPD (chronic obstructive pulmonary disease) ([place])   . Coronary artery disease   . Dyspnea    with exertion  . High cholesterol   . History of blood transfusion 1943  . History of kidney stones    passed  . Hypertension   . Mass of left lung   . Migraine    "none since the 1990s" (09/11/2016)  . Myocardial infarction ([place]) 01/20/1993; 02/28/1993  . Neuromuscular disorder ([facility])    neuropathy left  . On home O2   . Pneumonia    "several times" (09/11/2016)  . Tobacco abuse    ongoing    Past Surgical History:  Procedure Laterality Date  . APPENDECTOMY    . CARDIAC CATHETERIZATION    . CATARACT EXTRACTION, BILATERAL Bilateral   . CHEST TUBE INSERTION Left 06/28/2018   Procedure: INSERTION PLEURAL DRAINAGE CATHETER;  Surgeon: Ivin Poot, MD;  Location: [facility];  Service: Thoracic;  Laterality: Left;  . COLONOSCOPY    . HIP ARTHROPLASTY Right 12/20/2016   Procedure: ARTHROPLASTY BIPOLAR HIP (HEMIARTHROPLASTY);  Surgeon: [name], MD;  Location:  [facility];  Service: Orthopedics;  Laterality: Right;  . IR THORACENTESIS ASP PLEURAL SPACE W/IMG GUIDE  06/15/2018  . JOINT REPLACEMENT    . LEFT HEART CATH AND CORONARY ANGIOGRAPHY N/A 09/12/2016   Procedure: LEFT HEART CATH AND CORONARY ANGIOGRAPHY;  Surgeon: Burnell Blanks, MD;  Location: [facility];  Service: Cardiovascular;  Laterality: N/A;  . NASAL ENDOSCOPY WITH EPISTAXIS CONTROL Right 03/15/2016   Procedure: NASAL ENDOSCOPY WITH EPISTAXIS CONTROL;  Surgeon: Jodi Marble, MD;  Location: [facility];  Service: ENT;  Laterality: Right;  . PORTACATH PLACEMENT Right 04/23/2018   Procedure: INSERTION PORT-A-CATH;  Surgeon: Aviva Signs, MD;  Location: [facility];  Service: General;  Laterality: Right;  . SEPTOPLASTY Right 03/15/2016   Procedure: SEPTOPLASTY;  Surgeon: Jodi Marble, MD;  Location: [facility];  Service: ENT;  Laterality: Right;  . SINUS EXPLORATION Right 03/15/2016   Procedure: SINUS EXPLORATION;  Surgeon: Jodi Marble, MD;  Location: [facility];  Service: ENT;  Laterality: Right;  . TONSILLECTOMY    . TOOTH EXTRACTION    . TOTAL KNEE ARTHROPLASTY Left   . VIDEO BRONCHOSCOPY WITH ENDOBRONCHIAL ULTRASOUND N/A 12/15/2017   Procedure: VIDEO BRONCHOSCOPY WITH ENDOBRONCHIAL ULTRASOUND;  Surgeon: Rigoberto Noel, MD;  Location: [facility];  Service: Thoracic;  Laterality: N/A;    Family History  Adopted: Yes  Problem  Relation Age of Onset  . Cancer Mother   . Obesity Father     Social History Social History   Tobacco Use  . Smoking status: Current Every Day Smoker    Packs/day: 1.00    Years: 70.00    Pack years: 70.00    Types: Cigarettes  . Smokeless tobacco: Never Used  . Tobacco comment: smokes 8 a day  Substance Use Topics  . Alcohol use: No    Comment: 11/26/2017  "drank from the age of 8 til 03/05/1993"  . Drug use: Not Currently    Comment: 11/26/2017 "used some drugs when I was young"    Current Outpatient Medications  Medication Sig Dispense Refill  . ADVAIR HFA  115-21 MCG/ACT inhaler Inhale 2 puffs into the lungs every 12 (twelve) hours.  4  . amLODipine (NORVASC) 5 MG tablet TAKE 1 TABLET BY MOUTH EVERY DAY (Patient taking differently: Take 5 mg by mouth daily. ) 90 tablet 1  . Ascorbic Acid (VITAMIN C) 500 MG CAPS Take 500 mg by mouth daily.    [name] aspirin EC 81 MG tablet Take 81 mg by mouth daily.     . cyclobenzaprine (FLEXERIL) 5 MG tablet Take 5 mg by mouth 3 (three) times daily.     . Ferrous Sulfate (IRON) 325 (65 Fe) MG TABS Take 1 tablet by mouth daily.    . fluticasone (FLONASE) 50 MCG/ACT nasal spray Place 2 sprays into both nostrils daily.  6  . gabapentin (NEURONTIN) 300 MG capsule Take 1 capsule by mouth 3 (three) times daily.  2  . Ipilimumab (YERVOY IV) Inject into the vein every 6 (six) weeks.    [name] ipratropium-albuterol (DUONEB) 0.5-2.5 (3) MG/3ML SOLN Take 3 mLs by nebulization 3 (three) times daily. 360 mL   . lidocaine-prilocaine (EMLA) cream Apply 1 application topically as needed. Apply a small amount to port site and cover with plastic wrap 1 hour prior to infusion appointments 30 g 3  . lisinopril (ZESTRIL) 20 MG tablet Take 1 tablet (20 mg total) by mouth daily.    . metoCLOPramide (REGLAN) 5 MG tablet Take 1 tablet by mouth 4 (four) times daily -  before meals and at bedtime.  5  . metoprolol succinate (TOPROL-XL) 25 MG 24 hr tablet Take 25 mg by mouth daily.    . montelukast (SINGULAIR) 10 MG tablet Take 1 tablet by mouth daily.    . Multiple Vitamins-Minerals (MULTIVITAMIN WITH MINERALS) tablet Take 1 tablet by mouth daily.    [name] MYRBETRIQ 50 MG TB24 tablet Take 1 tablet by mouth daily.    . nitroGLYCERIN (NITROSTAT) 0.4 MG SL tablet Place 0.4 mg under the tongue every 5 (five) minutes as needed for chest pain.    . Nivolumab (OPDIVO IV) Inject into the vein every 14 (fourteen) days.    . Oxycodone HCl 10 MG TABS Take 1 tablet by mouth every 6 (six) hours.    . polyethylene glycol (MIRALAX / GLYCOLAX) 17 g packet Take 17 g by  mouth daily as needed for mild constipation. 14 each 0  . potassium gluconate (HM POTASSIUM) 595 (99 K) MG TABS tablet Take 595 mg by mouth daily.    [name] PRILOSEC 40 MG capsule Take 1 capsule by mouth daily.    [name] senna (SENOKOT) 8.6 MG TABS tablet Take 1 tablet (8.6 mg total) by mouth daily. 30 each 0  . simvastatin (ZOCOR) 10 MG tablet Take 10 mg by mouth at bedtime.   3  .  VENTOLIN HFA 108 (90 Base) MCG/ACT inhaler Inhale 2 puffs into the lungs every 4 (four) hours as needed for up to 30 days for wheezing or shortness of breath. 1 Inhaler 2   No current facility-administered medications for this visit.     Allergies  Allergen Reactions  . Aspirin     Regular asa     Review of Systems  Tolerating chemo fairly well has 6 more weeks left  BP 128/75   Pulse 87   Temp 97.9 F (36.6 C) (Skin)   Resp 20   Ht 6' (1.829 m)   Wt 155 lb (70.3 kg)   SpO2 (!) 87% Comment: RA- wearing mask  BMI 21.02 kg/m  Physical Exam Alert and comfortable Breath sounds clear bilaterally Pleurx exit site clean and dry Heart rate regular  Diagnostic Tests: Chest x-ray with evidence of left lung malignant disease but no significant pleural effusion  Impression: Left Pleurx catheter in place with decreasing drainage on chemotherapy Reduce drainage sessions to once a week Return for follow-up in 1 month Call if drainage remains very low on a weekly drainage schedule and will consider catheter removal.  Plan: Return in 4 weeks or sooner.  Len Childs, MD [facility] [phone]

## 2018-07-26 ENCOUNTER — Encounter: Payer: Self-pay | Admitting: Cardiovascular Disease

## 2018-07-26 ENCOUNTER — Encounter (HOSPITAL_COMMUNITY): Payer: Self-pay | Admitting: Hematology

## 2018-07-26 ENCOUNTER — Ambulatory Visit (INDEPENDENT_AMBULATORY_CARE_PROVIDER_SITE_OTHER): Payer: Medicare Other | Admitting: Cardiovascular Disease

## 2018-07-26 ENCOUNTER — Other Ambulatory Visit: Payer: Self-pay

## 2018-07-26 ENCOUNTER — Inpatient Hospital Stay (HOSPITAL_COMMUNITY): Payer: Medicare Other

## 2018-07-26 ENCOUNTER — Inpatient Hospital Stay (HOSPITAL_BASED_OUTPATIENT_CLINIC_OR_DEPARTMENT_OTHER): Payer: Medicare Other | Admitting: Hematology

## 2018-07-26 VITALS — BP 106/51 | HR 73 | Temp 97.8°F | Resp 20

## 2018-07-26 VITALS — BP 102/67 | HR 89 | Temp 98.4°F | Ht 72.0 in | Wt 156.0 lb

## 2018-07-26 DIAGNOSIS — C3492 Malignant neoplasm of unspecified part of left bronchus or lung: Secondary | ICD-10-CM

## 2018-07-26 DIAGNOSIS — R0789 Other chest pain: Secondary | ICD-10-CM | POA: Diagnosis not present

## 2018-07-26 DIAGNOSIS — Z79899 Other long term (current) drug therapy: Secondary | ICD-10-CM | POA: Diagnosis not present

## 2018-07-26 DIAGNOSIS — Z5112 Encounter for antineoplastic immunotherapy: Secondary | ICD-10-CM | POA: Diagnosis not present

## 2018-07-26 DIAGNOSIS — C3432 Malignant neoplasm of lower lobe, left bronchus or lung: Secondary | ICD-10-CM | POA: Diagnosis not present

## 2018-07-26 LAB — COMPREHENSIVE METABOLIC PANEL
ALT: 10 U/L (ref 0–44)
AST: 16 U/L (ref 15–41)
Albumin: 2.9 g/dL — ABNORMAL LOW (ref 3.5–5.0)
Alkaline Phosphatase: 48 U/L (ref 38–126)
Anion gap: 10 (ref 5–15)
BUN: 17 mg/dL (ref 8–23)
CO2: 24 mmol/L (ref 22–32)
Calcium: 9 mg/dL (ref 8.9–10.3)
Chloride: 105 mmol/L (ref 98–111)
Creatinine, Ser: 1.01 mg/dL (ref 0.61–1.24)
GFR calc Af Amer: >60 mL/min (ref >60)
GFR calc non Af Amer: >60 mL/min (ref >60)
Glucose, Bld: 131 mg/dL — ABNORMAL HIGH (ref 70–99)
Potassium: 3.9 mmol/L (ref 3.5–5.1)
Sodium: 139 mmol/L (ref 135–145)
Total Bilirubin: 0.8 mg/dL (ref 0.3–1.2)
Total Protein: 8.4 g/dL — ABNORMAL HIGH (ref 6.5–8.1)

## 2018-07-26 LAB — CBC WITH DIFFERENTIAL/PLATELET
Abs Immature Granulocytes: 0.09 10*3/uL — ABNORMAL HIGH (ref 0.00–0.07)
Basophils Absolute: 0.1 10*3/uL (ref 0.0–0.1)
Basophils Relative: 1 %
Eosinophils Absolute: 1.1 10*3/uL — ABNORMAL HIGH (ref 0.0–0.5)
Eosinophils Relative: 8 %
HCT: 45.7 % (ref 39.0–52.0)
Hemoglobin: 14.2 g/dL (ref 13.0–17.0)
Immature Granulocytes: 1 %
Lymphocytes Relative: 22 %
Lymphs Abs: 3.3 10*3/uL (ref 0.7–4.0)
MCH: 28 pg (ref 26.0–34.0)
MCHC: 31.1 g/dL (ref 30.0–36.0)
MCV: 90.1 fL (ref 80.0–100.0)
Monocytes Absolute: 1 10*3/uL (ref 0.1–1.0)
Monocytes Relative: 7 %
Neutro Abs: 9.3 10*3/uL — ABNORMAL HIGH (ref 1.7–7.7)
Neutrophils Relative %: 61 %
Platelets: 595 10*3/uL — ABNORMAL HIGH (ref 150–400)
RBC: 5.07 MIL/uL (ref 4.22–5.81)
RDW: 17.6 % — ABNORMAL HIGH (ref 11.5–15.5)
WBC: 14.9 10*3/uL — ABNORMAL HIGH (ref 4.0–10.5)
nRBC: 0 % (ref 0.0–0.2)

## 2018-07-26 LAB — TSH: TSH: 0.956 u[IU]/mL (ref 0.350–4.500)

## 2018-07-26 MED ORDER — SODIUM CHLORIDE 0.9 % IV SOLN
INTRAVENOUS | Status: DC
  Administered 2018-07-26: 11:00:00 via INTRAVENOUS

## 2018-07-26 MED ORDER — SODIUM CHLORIDE 0.9 % IV SOLN
3.0000 mg/kg | Freq: Once | INTRAVENOUS | Status: AC
  Administered 2018-07-26: 220 mg via INTRAVENOUS
  Filled 2018-07-26: qty 10

## 2018-07-26 MED ORDER — SODIUM CHLORIDE 0.9% FLUSH
10.0000 mL | INTRAVENOUS | Status: DC | PRN
  Administered 2018-07-26: 10 mL
  Filled 2018-07-26: qty 10

## 2018-07-26 MED ORDER — HEPARIN SOD (PORK) LOCK FLUSH 100 UNIT/ML IV SOLN
500.0000 [IU] | Freq: Once | INTRAVENOUS | Status: AC | PRN
  Administered 2018-07-26: 500 [IU]

## 2018-07-26 MED ORDER — SODIUM CHLORIDE 0.9 % IV SOLN
INTRAVENOUS | Status: AC
  Administered 2018-07-26: 10:00:00 via INTRAVENOUS

## 2018-07-26 MED ORDER — SODIUM CHLORIDE 0.9 % IV SOLN: Freq: Once | INTRAVENOUS | Status: DC

## 2018-07-26 NOTE — Progress Notes (Signed)
1010 Labs reviewed with and pt seen by Dr. Delton Coombes and pt approved for Opdivo infusion with hydration added per MD                                                                                      [name] tolerated Opdivo infusion with hydration well without complaints or incident. VSS upon discharge. Pt discharged via wheelchair in satisfactory condition

## 2018-07-26 NOTE — Progress Notes (Signed)
[place], [place] [address]   CLINIC:  Medical Oncology/Hematology  PCP:  Jani Gravel, MD [place] [address] [phone]   REASON FOR VISIT:  Follow-up for  Non-small cell lung cancer   BRIEF ONCOLOGIC HISTORY:  Oncology History  Non-small cell carcinoma of left lung ([facility])  12/29/2017 Initial Diagnosis   Non-small cell lung cancer ([facility])   04/26/2018 -  Chemotherapy   The patient had ipilimumab (YERVOY) 75 mg in sodium chloride 0.9 % 50 mL chemo infusion, 1 mg/kg = 75 mg (100 % of original dose 1 mg/kg), Intravenous,  Once, 2 of 3 cycles Dose modification: 1 mg/kg (original dose 1 mg/kg, Cycle 1, Reason: Other (see comments), Comment: per trial) Administration: 75 mg (04/26/2018), 75 mg (06/08/2018) nivolumab (OPDIVO) 220 mg in sodium chloride 0.9 % 100 mL chemo infusion, 3 mg/kg = 220 mg (100 % of original dose 3 mg/kg), Intravenous, Once, 5 of 9 cycles Dose modification: 3 mg/kg (original dose 3 mg/kg, Cycle 1, Reason: Other (see comments), Comment: Per trial) Administration: 220 mg (04/26/2018), 220 mg (06/08/2018), 220 mg (05/10/2018), 220 mg (05/24/2018), 220 mg (07/12/2018)  for chemotherapy treatment.       CANCER STAGING: Cancer Staging No matching staging information was found for the patient.   INTERVAL HISTORY:  [name] 78 y.o. male returns for follow-up of his lung cancer.  He tolerated nivolumab very well.  Denied any new onset cough.  Denied any diarrhea.  No skin rashes reported.  He was evaluated by Dr. Darcey Nora last week.  His Pleurx catheter is putting out about 2 tablespoons every 3 to 4 days.  He is now draining it every week.  His pain is well controlled with oxycodone 10 mg every 4 hours.  Appetite is 100%.  Energy levels are 25%.  Shortness of breath on exertion is stable.  He reportedly had chest pain lasting 30 minutes last night.  He was evaluated by Dr. Bronson Ing this morning.     REVIEW OF SYSTEMS:  Review of Systems  Respiratory: Positive for shortness of breath. Negative for cough.   Cardiovascular: Positive for chest pain.  Neurological: Negative for numbness.  All other systems reviewed and are negative.    PAST MEDICAL/SURGICAL HISTORY:  Past Medical History:  Diagnosis Date  . Cancer ([place])    lung  . Chronic lower back pain   . Chronic pain    pain management  . Contact with stonefish as cause of accidental injury 1954   "stung across my left hand in [place]; LUE weaker since"  . COPD (chronic obstructive pulmonary disease) ([place])   . Coronary artery disease   . Dyspnea    with exertion  . High cholesterol   . History of blood transfusion 1943  . History of kidney stones    passed  . Hypertension   . Mass of left lung   . Migraine    "none since the 1990s" (09/11/2016)  . Myocardial infarction ([place]) 01/20/1993; 02/28/1993  . Neuromuscular disorder ([facility])    neuropathy left  . On home O2   . Pneumonia    "several times" (09/11/2016)  . Tobacco abuse    ongoing   Past Surgical History:  Procedure Laterality Date  . APPENDECTOMY    . CARDIAC CATHETERIZATION    . CATARACT EXTRACTION, BILATERAL Bilateral   . CHEST TUBE INSERTION Left 06/28/2018   Procedure: INSERTION PLEURAL DRAINAGE CATHETER;  Surgeon: Lucianne Lei  Donney Rankins, MD;  [facility]: [facility];  Service: Thoracic;  Laterality: Left;  . COLONOSCOPY    . HIP ARTHROPLASTY Right 12/20/2016   Procedure: ARTHROPLASTY BIPOLAR HIP (HEMIARTHROPLASTY);  Surgeon: [name], MD;  Location: [facility];  Service: Orthopedics;  Laterality: Right;  . IR THORACENTESIS ASP PLEURAL SPACE W/IMG GUIDE  06/15/2018  . JOINT REPLACEMENT    . LEFT HEART CATH AND CORONARY ANGIOGRAPHY N/A 09/12/2016   Procedure: LEFT HEART CATH AND CORONARY ANGIOGRAPHY;  Surgeon: Burnell Blanks, MD;  Location: [facility];  Service: Cardiovascular;  Laterality: N/A;  . NASAL ENDOSCOPY WITH EPISTAXIS CONTROL Right 03/15/2016    Procedure: NASAL ENDOSCOPY WITH EPISTAXIS CONTROL;  Surgeon: Jodi Marble, MD;  Location: [facility];  Service: ENT;  Laterality: Right;  . PORTACATH PLACEMENT Right 04/23/2018   Procedure: INSERTION PORT-A-CATH;  Surgeon: Aviva Signs, MD;  [facility];  Service: General;  Laterality: Right;  . SEPTOPLASTY Right 03/15/2016   Procedure: SEPTOPLASTY;  Surgeon: Jodi Marble, MD;  Location: [facility];  Service: ENT;  Laterality: Right;  . SINUS EXPLORATION Right 03/15/2016   Procedure: SINUS EXPLORATION;  Surgeon: Jodi Marble, MD;  Location: [facility];  Service: ENT;  Laterality: Right;  . TONSILLECTOMY    . TOOTH EXTRACTION    . TOTAL KNEE ARTHROPLASTY Left   . VIDEO BRONCHOSCOPY WITH ENDOBRONCHIAL ULTRASOUND N/A 12/15/2017   Procedure: VIDEO BRONCHOSCOPY WITH ENDOBRONCHIAL ULTRASOUND;  Surgeon: Rigoberto Noel, MD;  Location: [facility];  Service: Thoracic;  Laterality: N/A;     SOCIAL HISTORY:  Social History   Socioeconomic History  . Marital status: Widowed    Spouse name: Not on file  . Number of children: Not on file  . Years of education: Not on file  . Highest education level: Not on file  Occupational History  . Not on file  Social Needs  . Financial resource strain: Not on file  . Food insecurity    Worry: Not on file    Inability: Not on file  . Transportation needs    Medical: Not on file    Non-medical: Not on file  Tobacco Use  . Smoking status: Current Every Day Smoker    Packs/day: 1.00    Years: 70.00    Pack years: 70.00    Types: Cigarettes  . Smokeless tobacco: Never Used  . Tobacco comment: smokes 8 a day  Substance and Sexual Activity  . Alcohol use: No    Comment: 11/26/2017  "drank from the age of 8 til 03/05/1993"  . Drug use: Not Currently    Comment: 11/26/2017 "used some drugs when I was young"  . Sexual activity: Not Currently  Lifestyle  . Physical activity    Days per week: Not on file    Minutes per session: Not on file  . Stress: Not on file   Relationships  . Social Herbalist on phone: Not on file    Gets together: Not on file    Attends religious service: Not on file    Active member of club or organization: Not on file    Attends meetings of clubs or organizations: Not on file    Relationship status: Not on file  . Intimate partner violence    Fear of current or ex partner: Not on file    Emotionally abused: Not on file    Physically abused: Not on file    Forced sexual activity: Not on file  Other Topics  Concern  . Not on file  Social History Narrative  . Not on file    FAMILY HISTORY:  Family History  Adopted: Yes  Problem Relation Age of Onset  . Cancer Mother   . Obesity Father     CURRENT MEDICATIONS:  Outpatient Encounter Medications as of 07/26/2018  Medication Sig  . ADVAIR HFA 115-21 MCG/ACT inhaler Inhale 2 puffs into the lungs every 12 (twelve) hours.  [name] amLODipine (NORVASC) 5 MG tablet TAKE 1 TABLET BY MOUTH EVERY DAY (Patient taking differently: Take 5 mg by mouth daily. )  . Ascorbic Acid (VITAMIN C) 500 MG CAPS Take 500 mg by mouth daily.  [name] aspirin EC 81 MG tablet Take 81 mg by mouth daily.   . cyclobenzaprine (FLEXERIL) 5 MG tablet Take 5 mg by mouth 3 (three) times daily.   . Ferrous Sulfate (IRON) 325 (65 Fe) MG TABS Take 1 tablet by mouth daily.  . fluticasone (FLONASE) 50 MCG/ACT nasal spray Place 2 sprays into both nostrils daily.  [name] gabapentin (NEURONTIN) 300 MG capsule Take 1 capsule by mouth 3 (three) times daily.  [name] Ipilimumab (YERVOY IV) Inject into the vein every 6 (six) weeks.  [name] ipratropium-albuterol (DUONEB) 0.5-2.5 (3) MG/3ML SOLN Take 3 mLs by nebulization 3 (three) times daily.  [name] lidocaine-prilocaine (EMLA) cream Apply 1 application topically as needed. Apply a small amount to port site and cover with plastic wrap 1 hour prior to infusion appointments  . lisinopril (ZESTRIL) 20 MG tablet Take 1 tablet (20 mg total) by mouth daily.  . metoCLOPramide (REGLAN) 5 MG  tablet Take 1 tablet by mouth 4 (four) times daily -  before meals and at bedtime.  . metoprolol succinate (TOPROL-XL) 25 MG 24 hr tablet Take 25 mg by mouth daily.  . montelukast (SINGULAIR) 10 MG tablet Take 1 tablet by mouth daily.  . Multiple Vitamins-Minerals (MULTIVITAMIN WITH MINERALS) tablet Take 1 tablet by mouth daily.  [name] MYRBETRIQ 50 MG TB24 tablet Take 1 tablet by mouth daily.  . nitroGLYCERIN (NITROSTAT) 0.4 MG SL tablet Place 0.4 mg under the tongue every 5 (five) minutes as needed for chest pain.  . Nivolumab (OPDIVO IV) Inject into the vein every 14 (fourteen) days.  . Oxycodone HCl 10 MG TABS Take 1 tablet by mouth every 6 (six) hours.  . polyethylene glycol (MIRALAX / GLYCOLAX) 17 g packet Take 17 g by mouth daily as needed for mild constipation.  . potassium gluconate (HM POTASSIUM) 595 (99 K) MG TABS tablet Take 595 mg by mouth daily.  [name] PRILOSEC 40 MG capsule Take 1 capsule by mouth daily.  [name] senna (SENOKOT) 8.6 MG TABS tablet Take 1 tablet (8.6 mg total) by mouth daily.  . simvastatin (ZOCOR) 10 MG tablet Take 10 mg by mouth at bedtime.   . VENTOLIN HFA 108 (90 Base) MCG/ACT inhaler Inhale 2 puffs into the lungs every 4 (four) hours as needed for up to 30 days for wheezing or shortness of breath.   No facility-administered encounter medications on file as of 07/26/2018.     ALLERGIES:  Allergies  Allergen Reactions  . Aspirin     Regular asa      PHYSICAL EXAM:  ECOG Performance status: 1  Vitals:   07/26/18 0900  BP: 116/64  Pulse: 83  Resp: 19  Temp: (!) 97.5 F (36.4 C)  SpO2: (!) 89%   Filed Weights   07/26/18 0900  Weight: 154 lb 7 oz (70.1 kg)  Physical Exam Vitals signs reviewed.  Constitutional:      Appearance: Normal appearance.  Cardiovascular:     Rate and Rhythm: Normal rate and regular rhythm.     Heart sounds: Normal heart sounds.  Pulmonary:     Effort: Pulmonary effort is normal.     Comments: Decreased breath sounds at the  left lung base. Abdominal:     General: There is no distension.     Palpations: Abdomen is soft. There is no mass.  Musculoskeletal:        General: No swelling.  Skin:    General: Skin is warm.  Neurological:     General: No focal deficit present.     Mental Status: He is alert and oriented to person, place, and time.  Psychiatric:        Mood and Affect: Mood normal.        Behavior: Behavior normal.      LABORATORY DATA:  I have reviewed the labs as listed.  CBC    Component Value Date/Time   WBC 14.9 (H) 07/26/2018 0815   RBC 5.07 07/26/2018 0815   HGB 14.2 07/26/2018 0815   HCT 45.7 07/26/2018 0815   PLT 595 (H) 07/26/2018 0815   MCV 90.1 07/26/2018 0815   MCH 28.0 07/26/2018 0815   MCHC 31.1 07/26/2018 0815   RDW 17.6 (H) 07/26/2018 0815   LYMPHSABS 3.3 07/26/2018 0815   MONOABS 1.0 07/26/2018 0815   EOSABS 1.1 (H) 07/26/2018 0815   BASOSABS 0.1 07/26/2018 0815   CMP Latest Ref Rng & Units 07/26/2018 07/08/2018 06/30/2018  Glucose 70 - 99 mg/dL 131(H) 106(H) 115(H)  BUN 8 - 23 mg/dL '17 14 17  ' Creatinine 0.61 - 1.24 mg/dL 1.01 0.86 0.97  Sodium 135 - 145 mmol/L 139 138 134(L)  Potassium 3.5 - 5.1 mmol/L 3.9 3.6 4.1  Chloride 98 - 111 mmol/L 105 102 98  CO2 22 - 32 mmol/L '24 23 26  ' Calcium 8.9 - 10.3 mg/dL 9.0 8.9 9.1  Total Protein 6.5 - 8.1 g/dL 8.4(H) 8.2(H) 7.3  Total Bilirubin 0.3 - 1.2 mg/dL 0.8 0.9 1.0  Alkaline Phos 38 - 126 U/L 48 67 45  AST 15 - 41 U/L '16 15 15  ' ALT 0 - 44 U/L '10 15 14       ' DIAGNOSTIC IMAGING:  I have independently reviewed the scans and discussed with the patient.     ASSESSMENT & PLAN:   Non-small cell carcinoma of left lung ([place]) 1.  Non-small cell lung cancer, adenocarcinoma: - [facility] 1 testing shows MS-stable, TMB 20 muts/Mb, BRAF amplification, MET amplification, MYC and KIT amplification, PDGFRA/SOX2 amplifications.  PD-L1 could not be done because of scant tissue. - Biopsy of the liver lesion on 03/17/2018 for  molecular study showed poorly differentiated carcinoma. -Combination immunotherapy with ipilimumab and nivolumab cycle 1 on 04/26/2018 and cycle 2 on 06/08/2018. - He had multiple hospitalizations after start of immunotherapy. - CT of the chest on 06/13/2018 showed new anterior mediastinal mass suspicious for recurrent tumor.  New liver lesions.  This was compared from PET scan on February 15, 2018.  He did not start treatments for 2 months after that scan.  We did not have a baseline scan. -He had a left Pleurx catheter placed on 06/28/2018. - Nivolumab restarted on 07/12/2018.  He tolerated it very well.  No immunotherapy related side effects. - He was evaluated by Dr. Darcey Nora last week.  His left Pleurx catheter is draining less than  50 mL.  He is draining it once a week now.  If it continues to be like that, it will be discontinued in a month. - He reportedly had chest pain last night which lasted about 30 minutes.  He was evaluated by Dr. Bronson Ing this morning.  Was felt to be noncardiac. - We will proceed with his treatment.  His creatinine is slightly elevated today.  He will also receive 100 mL of normal saline. - He also met with [facility] palliative team. -We will see him back in 2 weeks for follow-up.  I plan to repeat scans after next treatment.   2.  Left chest wall pain: -This has improved since the Pleurx catheter placement. -He is taking oxycodone 10 mg every 4 hours.   Total time spent is 25 minutes with more than 50% of the time spent face-to-face discussing treatment plan, pain management and coordination of care.  Orders placed this encounter:  No orders of the defined types were placed in this encounter.     Derek Jack, MD [place] [phone]

## 2018-07-26 NOTE — Patient Instructions (Signed)
[facility] Discharge Instructions for Patients Receiving Chemotherapy   Beginning January 23rd 2017 lab work for the [facility] will be done in the  Main lab at [facility] on 1st floor. If you have a lab appointment with the [facility] please come in thru the  Main Entrance and check in at the main information desk   Today you received the following chemotherapy agents Opdivo as well as hydration. Follow-up as scheduled. Call clinic for any questions or concerns  To help prevent nausea and vomiting after your treatment, we encourage you to take your nausea medication   If you develop nausea and vomiting, or diarrhea that is not controlled by your medication, call the clinic.  The clinic phone number is [phone]. Office hours are Monday-Friday 8:30am-5:00pm.  BELOW ARE SYMPTOMS THAT SHOULD BE REPORTED IMMEDIATELY:  *FEVER GREATER THAN 101.0 F  *CHILLS WITH OR WITHOUT FEVER  NAUSEA AND VOMITING THAT IS NOT CONTROLLED WITH YOUR NAUSEA MEDICATION  *UNUSUAL SHORTNESS OF BREATH  *UNUSUAL BRUISING OR BLEEDING  TENDERNESS IN MOUTH AND THROAT WITH OR WITHOUT PRESENCE OF ULCERS  *URINARY PROBLEMS  *BOWEL PROBLEMS  UNUSUAL RASH Items with * indicate a potential emergency and should be followed up as soon as possible. If you have an emergency after office hours please contact your primary care physician or go to the nearest emergency department.  Please call the clinic during office hours if you have any questions or concerns.   You may also contact the Patient Navigator at [phone] should you have any questions or need assistance in obtaining follow up care.      Resources For Cancer Patients and their Caregivers ? American Cancer Society: Can assist with transportation, wigs, general needs, runs Look Good Feel Better.        334-878-7857 ? Cancer Care: Provides financial assistance, online support groups, medication/co-pay assistance.   1-800-813-HOPE (224)285-2172) ? [place] Assists [place] cancer patients and their families through emotional , educational and financial support.  716-531-2226 ? [place] DSS Where to apply for food stamps, Medicaid and utility assistance. 7408373642 ? [facility]: Transportation to medical appointments. 727 651 5664 ? Social Security Administration: May apply for disability if have a Stage IV cancer. 937-095-7121 956-282-1268 ? [employer], Disability and Transit Services: Assists with nutrition, care and transit needs. (985)865-7342

## 2018-07-26 NOTE — Patient Instructions (Signed)
Medication Instructions: Your physician recommends that you continue on your current medications as directed. Please refer to the Current Medication list given to you today.   Labwork: none  Procedures/Testing: none  Follow-Up: As Needed  Any Additional Special Instructions Will Be Listed Below (If Applicable).    Thank you for choosing [facility] !

## 2018-07-26 NOTE — Assessment & Plan Note (Addendum)
1.  Non-small cell lung cancer, adenocarcinoma: - Foundation 1 testing shows MS-stable, TMB 20 muts/Mb, BRAF amplification, MET amplification, MYC and KIT amplification, PDGFRA/SOX2 amplifications.  PD-L1 could not be done because of scant tissue. - Biopsy of the liver lesion on 03/17/2018 for molecular study showed poorly differentiated carcinoma. -Combination immunotherapy with ipilimumab and nivolumab cycle 1 on 04/26/2018 and cycle 2 on 06/08/2018. - He had multiple hospitalizations after start of immunotherapy. - CT of the chest on 06/13/2018 showed new anterior mediastinal mass suspicious for recurrent tumor.  New liver lesions.  This was compared from PET scan on February 15, 2018.  He did not start treatments for 2 months after that scan.  We did not have a baseline scan. -He had a left Pleurx catheter placed on 06/28/2018. - Nivolumab restarted on 07/12/2018.  He tolerated it very well.  No immunotherapy related side effects. - He was evaluated by Dr. Darcey Nora last week.  His left Pleurx catheter is draining less than 50 mL.  He is draining it once a week now.  If it continues to be like that, it will be discontinued in a month. - He reportedly had chest pain last night which lasted about 30 minutes.  He was evaluated by Dr. Bronson Ing this morning.  Was felt to be noncardiac. - We will proceed with his treatment.  His creatinine is slightly elevated today.  He will also receive 100 mL of normal saline. - He also met with [facility] palliative team. -We will see him back in 2 weeks for follow-up.  I plan to repeat scans after next treatment.   2.  Left chest wall pain: -This has improved since the Pleurx catheter placement. -He is taking oxycodone 10 mg every 4 hours.

## 2018-07-26 NOTE — Patient Instructions (Addendum)
[facility] at [facility] Discharge Instructions  You were seen today by Dr. Katragadda. He went over your recent lab results. He will see you back in 3 weeks for labs and follow up.   Thank you for choosing [facility] at [facility] to provide your oncology and hematology care.  To afford each patient quality time with our provider, please arrive at least 15 minutes before your scheduled appointment time.   If you have a lab appointment with the Cancer Center please come in thru the  Main Entrance and check in at the main information desk  You need to re-schedule your appointment should you arrive 10 or more minutes late.  We strive to give you quality time with our providers, and arriving late affects you and other patients whose appointments are after yours.  Also, if you no show three or more times for appointments you may be dismissed from the clinic at the providers discretion.     Again, thank you for choosing [facility].  Our hope is that these requests will decrease the amount of time that you wait before being seen by our physicians.       _____________________________________________________________  Should you have questions after your visit to [facility], please contact our office at [phone] between the hours of 8:00 a.m. and 4:30 p.m.  Voicemails left after 4:00 p.m. will not be returned until the following business day.  For prescription refill requests, have your pharmacy contact our office and allow 72 hours.    Cancer Center Support Programs:   > Cancer Support Group  2nd Tuesday of the month 1pm-2pm, Journey Room    

## 2018-07-26 NOTE — Progress Notes (Signed)
SUBJECTIVE: The patient presents for routine follow-up.  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  He has a history of mild nonobstructive coronary artery disease on cath in 2018 as well as hypertension, hyperlipidemia, and tobacco abuse.  He was evaluated in the hospital for chest pain and syncope in November 2019.  It was felt he had an episode of transient global amnesia in the setting of multiple medical comorbidities.  It was unclear if the episode truly represented syncope or just amnesia.  Echocardiogram on 11/26/2017 demonstrated normal left ventricular systolic function and regional wall motion, LVEF 55 to 60%, mild LVH, grade 1 diastolic dysfunction, and moderately thickened aortic valve leaflets and mildly thickened mitral valve leaflets.  There is no stenosis of either valve.  I personally reviewed the ECG performed on 06/29/2018 which showed sinus rhythm and LVH.  He very seldom has chest pains sometimes once a year and sometimes once in 3 years.  He has not had to use nitroglycerin in quite some time.  He has non-small cell adenocarcinoma.  He uses 3 L of oxygen continuously.  He smokes about 10 cigarettes a day.  He had some chest pains last night which he felt were due to GERD.    Review of Systems: As per "subjective", otherwise negative.  Allergies  Allergen Reactions  . Aspirin     Regular asa     Current Outpatient Medications  Medication Sig Dispense Refill  . ADVAIR HFA 115-21 MCG/ACT inhaler Inhale 2 puffs into the lungs every 12 (twelve) hours.  4  . amLODipine (NORVASC) 5 MG tablet TAKE 1 TABLET BY MOUTH EVERY DAY (Patient taking differently: Take 5 mg by mouth daily. ) 90 tablet 1  . Ascorbic Acid (VITAMIN C) 500 MG CAPS Take 500 mg by mouth daily.    [name] aspirin EC 81 MG tablet Take 81 mg by mouth daily.     . cyclobenzaprine (FLEXERIL) 5 MG tablet Take 5 mg by mouth 3  (three) times daily.     . Ferrous Sulfate (IRON) 325 (65 Fe) MG TABS Take 1 tablet by mouth daily.    . fluticasone (FLONASE) 50 MCG/ACT nasal spray Place 2 sprays into both nostrils daily.  6  . gabapentin (NEURONTIN) 300 MG capsule Take 1 capsule by mouth 3 (three) times daily.  2  . Ipilimumab (YERVOY IV) Inject into the vein every 6 (six) weeks.    [name] ipratropium-albuterol (DUONEB) 0.5-2.5 (3) MG/3ML SOLN Take 3 mLs by nebulization 3 (three) times daily. 360 mL   . lidocaine-prilocaine (EMLA) cream Apply 1 application topically as needed. Apply a small amount to port site and cover with plastic wrap 1 hour prior to infusion appointments 30 g 3  . lisinopril (ZESTRIL) 20 MG tablet Take 1 tablet (20 mg total) by mouth daily.    . metoCLOPramide (REGLAN) 5 MG tablet Take 1 tablet by mouth 4 (four) times daily -  before meals and at bedtime.  5  . metoprolol succinate (TOPROL-XL) 25 MG 24 hr tablet Take 25 mg by mouth daily.    . montelukast (SINGULAIR) 10 MG tablet Take 1 tablet by mouth daily.    . Multiple Vitamins-Minerals (MULTIVITAMIN WITH MINERALS) tablet Take 1 tablet by mouth daily.    [name] MYRBETRIQ 50 MG TB24 tablet Take 1 tablet by mouth daily.    . nitroGLYCERIN (NITROSTAT) 0.4 MG SL tablet  Place 0.4 mg under the tongue every 5 (five) minutes as needed for chest pain.    . Nivolumab (OPDIVO IV) Inject into the vein every 14 (fourteen) days.    . Oxycodone HCl 10 MG TABS Take 1 tablet by mouth every 6 (six) hours.    . polyethylene glycol (MIRALAX / GLYCOLAX) 17 g packet Take 17 g by mouth daily as needed for mild constipation. 14 each 0  . potassium gluconate (HM POTASSIUM) 595 (99 K) MG TABS tablet Take 595 mg by mouth daily.    [name] PRILOSEC 40 MG capsule Take 1 capsule by mouth daily.    [name] senna (SENOKOT) 8.6 MG TABS tablet Take 1 tablet (8.6 mg total) by mouth daily. 30 each 0  . simvastatin (ZOCOR) 10 MG tablet Take 10 mg by mouth at bedtime.   3  . VENTOLIN HFA 108 (90 Base) MCG/ACT  inhaler Inhale 2 puffs into the lungs every 4 (four) hours as needed for up to 30 days for wheezing or shortness of breath. 1 Inhaler 2   No current facility-administered medications for this visit.     Past Medical History:  Diagnosis Date  . Cancer ([place])    lung  . Chronic lower back pain   . Chronic pain    pain management  . Contact with stonefish as cause of accidental injury 1954   "stung across my left hand in [place]; LUE weaker since"  . COPD (chronic obstructive pulmonary disease) ([place])   . Coronary artery disease   . Dyspnea    with exertion  . High cholesterol   . History of blood transfusion 1943  . History of kidney stones    passed  . Hypertension   . Mass of left lung   . Migraine    "none since the 1990s" (09/11/2016)  . Myocardial infarction ([place]) 01/20/1993; 02/28/1993  . Neuromuscular disorder ([facility])    neuropathy left  . On home O2   . Pneumonia    "several times" (09/11/2016)  . Tobacco abuse    ongoing    Past Surgical History:  Procedure Laterality Date  . APPENDECTOMY    . CARDIAC CATHETERIZATION    . CATARACT EXTRACTION, BILATERAL Bilateral   . CHEST TUBE INSERTION Left 06/28/2018   Procedure: INSERTION PLEURAL DRAINAGE CATHETER;  Surgeon: Ivin Poot, MD;  Location: [facility];  Service: Thoracic;  Laterality: Left;  . COLONOSCOPY    . HIP ARTHROPLASTY Right 12/20/2016   Procedure: ARTHROPLASTY BIPOLAR HIP (HEMIARTHROPLASTY);  Surgeon: [name], MD;  Location: [facility];  Service: Orthopedics;  Laterality: Right;  . IR THORACENTESIS ASP PLEURAL SPACE W/IMG GUIDE  06/15/2018  . JOINT REPLACEMENT    . LEFT HEART CATH AND CORONARY ANGIOGRAPHY N/A 09/12/2016   Procedure: LEFT HEART CATH AND CORONARY ANGIOGRAPHY;  Surgeon: Burnell Blanks, MD;  Location: [facility];  Service: Cardiovascular;  Laterality: N/A;  . NASAL ENDOSCOPY WITH EPISTAXIS CONTROL Right 03/15/2016   Procedure: NASAL ENDOSCOPY WITH EPISTAXIS CONTROL;  Surgeon: Jodi Marble, MD;  Location: [facility];  Service: ENT;  Laterality: Right;  . PORTACATH PLACEMENT Right 04/23/2018   Procedure: INSERTION PORT-A-CATH;  Surgeon: Aviva Signs, MD;  Location: [facility];  Service: General;  Laterality: Right;  . SEPTOPLASTY Right 03/15/2016   Procedure: SEPTOPLASTY;  Surgeon: Jodi Marble, MD;  Location: [facility];  Service: ENT;  Laterality: Right;  . SINUS EXPLORATION Right 03/15/2016   Procedure: SINUS EXPLORATION;  Surgeon: Jodi Marble, MD;  Location: [facility];  Service: ENT;  Laterality: Right;  . TONSILLECTOMY    . TOOTH EXTRACTION    . TOTAL KNEE ARTHROPLASTY Left   . VIDEO BRONCHOSCOPY WITH ENDOBRONCHIAL ULTRASOUND N/A 12/15/2017   Procedure: VIDEO BRONCHOSCOPY WITH ENDOBRONCHIAL ULTRASOUND;  Surgeon: Rigoberto Noel, MD;  Location: [facility];  Service: Thoracic;  Laterality: N/A;    Social History   Socioeconomic History  . Marital status: Widowed    Spouse name: Not on file  . Number of children: Not on file  . Years of education: Not on file  . Highest education level: Not on file  Occupational History  . Not on file  Social Needs  . Financial resource strain: Not on file  . Food insecurity    Worry: Not on file    Inability: Not on file  . Transportation needs    Medical: Not on file    Non-medical: Not on file  Tobacco Use  . Smoking status: Current Every Day Smoker    Packs/day: 1.00    Years: 70.00    Pack years: 70.00    Types: Cigarettes  . Smokeless tobacco: Never Used  . Tobacco comment: smokes 8 a day  Substance and Sexual Activity  . Alcohol use: No    Comment: 11/26/2017  "drank from the age of 8 til 03/05/1993"  . Drug use: Not Currently    Comment: 11/26/2017 "used some drugs when I was young"  . Sexual activity: Not Currently  Lifestyle  . Physical activity    Days per week: Not on file    Minutes per session: Not on file  . Stress: Not on file  Relationships  . Social Herbalist on phone: Not on file    Gets together: Not  on file    Attends religious service: Not on file    Active member of club or organization: Not on file    Attends meetings of clubs or organizations: Not on file    Relationship status: Not on file  . Intimate partner violence    Fear of current or ex partner: Not on file    Emotionally abused: Not on file    Physically abused: Not on file    Forced sexual activity: Not on file  Other Topics Concern  . Not on file  Social History Narrative  . Not on file     Vitals:   07/26/18 0836  BP: 102/67  Pulse: 89  Temp: 98.4 F (36.9 C)  SpO2: 90%  Weight: 156 lb (70.8 kg)  Height: 6' (1.829 m)    Wt Readings from Last 3 Encounters:  07/26/18 156 lb (70.8 kg)  07/23/18 155 lb (70.3 kg)  07/12/18 155 lb (70.3 kg)     PHYSICAL EXAM General: NAD HEENT: Normal. Neck: No JVD, no thyromegaly. Lungs: Poor air movement throughout, no crackles. CV: Regular rate and rhythm, normal S1/S2, no S3/S4, no murmur. No pretibial or periankle edema.  Abdomen: Soft, nontender, no distention.  Neurologic: Alert and oriented.  Psych: Normal affect. Skin: Normal. Musculoskeletal: No gross deformities.    ECG: Reviewed above under Subjective   Labs: Lab Results  Component Value Date/Time   K 3.9 07/26/2018 08:15 AM   BUN 17 07/26/2018 08:15 AM   CREATININE 1.01 07/26/2018 08:15 AM   ALT 10 07/26/2018 08:15 AM   TSH 0.711 07/08/2018 08:35 AM   HGB 14.2 07/26/2018 08:15 AM     Lipids: Lab Results  Component Value  Date/Time   LDLCALC 54 09/12/2016 04:12 AM   CHOL 107 09/12/2016 04:12 AM   TRIG 87 09/12/2016 04:12 AM   HDL 36 (L) 09/12/2016 04:12 AM       ASSESSMENT AND PLAN: 1.  Atypical chest pain: He very seldom has chest pains.  I reviewed the cardiac catheterization from 2018 which demonstrated mild nonobstructive disease.  Left ventricular systolic function was normal by echocardiogram in November 2019.  No further cardiac testing is indicated at this time.     Disposition: Follow up prn   Kate Sable, M.D., F.A.C.C.

## 2018-07-28 DIAGNOSIS — Z791 Long term (current) use of non-steroidal anti-inflammatories (NSAID): Secondary | ICD-10-CM | POA: Diagnosis not present

## 2018-07-28 DIAGNOSIS — G8929 Other chronic pain: Secondary | ICD-10-CM | POA: Diagnosis not present

## 2018-07-28 DIAGNOSIS — Z7982 Long term (current) use of aspirin: Secondary | ICD-10-CM | POA: Diagnosis not present

## 2018-07-28 DIAGNOSIS — G709 Myoneural disorder, unspecified: Secondary | ICD-10-CM | POA: Diagnosis not present

## 2018-07-28 DIAGNOSIS — Z96641 Presence of right artificial hip joint: Secondary | ICD-10-CM | POA: Diagnosis not present

## 2018-07-28 DIAGNOSIS — C787 Secondary malignant neoplasm of liver and intrahepatic bile duct: Secondary | ICD-10-CM | POA: Diagnosis not present

## 2018-07-28 DIAGNOSIS — M545 Low back pain: Secondary | ICD-10-CM | POA: Diagnosis not present

## 2018-07-28 DIAGNOSIS — Z7951 Long term (current) use of inhaled steroids: Secondary | ICD-10-CM | POA: Diagnosis not present

## 2018-07-28 DIAGNOSIS — I2510 Atherosclerotic heart disease of native coronary artery without angina pectoris: Secondary | ICD-10-CM | POA: Diagnosis not present

## 2018-07-28 DIAGNOSIS — Z96652 Presence of left artificial knee joint: Secondary | ICD-10-CM | POA: Diagnosis not present

## 2018-07-28 DIAGNOSIS — C3490 Malignant neoplasm of unspecified part of unspecified bronchus or lung: Secondary | ICD-10-CM | POA: Diagnosis not present

## 2018-07-28 DIAGNOSIS — J440 Chronic obstructive pulmonary disease with acute lower respiratory infection: Secondary | ICD-10-CM | POA: Diagnosis not present

## 2018-07-28 DIAGNOSIS — J189 Pneumonia, unspecified organism: Secondary | ICD-10-CM | POA: Diagnosis not present

## 2018-07-28 DIAGNOSIS — I10 Essential (primary) hypertension: Secondary | ICD-10-CM | POA: Diagnosis not present

## 2018-07-28 DIAGNOSIS — J90 Pleural effusion, not elsewhere classified: Secondary | ICD-10-CM | POA: Diagnosis not present

## 2018-08-03 ENCOUNTER — Inpatient Hospital Stay (HOSPITAL_COMMUNITY)
Admission: EM | Admit: 2018-08-03 | Discharge: 2018-08-05 | DRG: 189 | Disposition: A | Discharge status: Home Health | Payer: Medicare Other | Attending: Family Medicine | Admitting: Family Medicine

## 2018-08-03 ENCOUNTER — Encounter (HOSPITAL_COMMUNITY): Payer: Self-pay | Admitting: Emergency Medicine

## 2018-08-03 ENCOUNTER — Emergency Department (HOSPITAL_COMMUNITY): Payer: Medicare Other

## 2018-08-03 ENCOUNTER — Other Ambulatory Visit: Payer: Self-pay

## 2018-08-03 DIAGNOSIS — J9621 Acute and chronic respiratory failure with hypoxia: Principal | ICD-10-CM | POA: Diagnosis present

## 2018-08-03 DIAGNOSIS — M545 Low back pain: Secondary | ICD-10-CM | POA: Diagnosis not present

## 2018-08-03 DIAGNOSIS — J9601 Acute respiratory failure with hypoxia: Secondary | ICD-10-CM | POA: Diagnosis present

## 2018-08-03 DIAGNOSIS — Z7951 Long term (current) use of inhaled steroids: Secondary | ICD-10-CM

## 2018-08-03 DIAGNOSIS — J90 Pleural effusion, not elsewhere classified: Secondary | ICD-10-CM | POA: Diagnosis not present

## 2018-08-03 DIAGNOSIS — J439 Emphysema, unspecified: Secondary | ICD-10-CM | POA: Diagnosis not present

## 2018-08-03 DIAGNOSIS — Z79891 Long term (current) use of opiate analgesic: Secondary | ICD-10-CM

## 2018-08-03 DIAGNOSIS — Z9981 Dependence on supplemental oxygen: Secondary | ICD-10-CM | POA: Diagnosis not present

## 2018-08-03 DIAGNOSIS — I10 Essential (primary) hypertension: Secondary | ICD-10-CM | POA: Diagnosis present

## 2018-08-03 DIAGNOSIS — J189 Pneumonia, unspecified organism: Secondary | ICD-10-CM | POA: Diagnosis not present

## 2018-08-03 DIAGNOSIS — Z66 Do not resuscitate: Secondary | ICD-10-CM | POA: Diagnosis not present

## 2018-08-03 DIAGNOSIS — I2510 Atherosclerotic heart disease of native coronary artery without angina pectoris: Secondary | ICD-10-CM | POA: Diagnosis present

## 2018-08-03 DIAGNOSIS — Z720 Tobacco use: Secondary | ICD-10-CM | POA: Diagnosis not present

## 2018-08-03 DIAGNOSIS — C787 Secondary malignant neoplasm of liver and intrahepatic bile duct: Secondary | ICD-10-CM | POA: Diagnosis present

## 2018-08-03 DIAGNOSIS — R0602 Shortness of breath: Secondary | ICD-10-CM

## 2018-08-03 DIAGNOSIS — C3490 Malignant neoplasm of unspecified part of unspecified bronchus or lung: Secondary | ICD-10-CM | POA: Diagnosis not present

## 2018-08-03 DIAGNOSIS — E785 Hyperlipidemia, unspecified: Secondary | ICD-10-CM | POA: Diagnosis not present

## 2018-08-03 DIAGNOSIS — G894 Chronic pain syndrome: Secondary | ICD-10-CM | POA: Diagnosis not present

## 2018-08-03 DIAGNOSIS — Z791 Long term (current) use of non-steroidal anti-inflammatories (NSAID): Secondary | ICD-10-CM | POA: Diagnosis not present

## 2018-08-03 DIAGNOSIS — Z96652 Presence of left artificial knee joint: Secondary | ICD-10-CM | POA: Diagnosis not present

## 2018-08-03 DIAGNOSIS — R739 Hyperglycemia, unspecified: Secondary | ICD-10-CM | POA: Diagnosis not present

## 2018-08-03 DIAGNOSIS — Z7982 Long term (current) use of aspirin: Secondary | ICD-10-CM | POA: Diagnosis not present

## 2018-08-03 DIAGNOSIS — Y95 Nosocomial condition: Secondary | ICD-10-CM | POA: Diagnosis present

## 2018-08-03 DIAGNOSIS — C3492 Malignant neoplasm of unspecified part of left bronchus or lung: Secondary | ICD-10-CM | POA: Diagnosis present

## 2018-08-03 DIAGNOSIS — I252 Old myocardial infarction: Secondary | ICD-10-CM | POA: Diagnosis not present

## 2018-08-03 DIAGNOSIS — Z20828 Contact with and (suspected) exposure to other viral communicable diseases: Secondary | ICD-10-CM | POA: Diagnosis present

## 2018-08-03 DIAGNOSIS — G8929 Other chronic pain: Secondary | ICD-10-CM | POA: Diagnosis not present

## 2018-08-03 DIAGNOSIS — J440 Chronic obstructive pulmonary disease with acute lower respiratory infection: Secondary | ICD-10-CM | POA: Diagnosis not present

## 2018-08-03 DIAGNOSIS — G709 Myoneural disorder, unspecified: Secondary | ICD-10-CM | POA: Diagnosis not present

## 2018-08-03 DIAGNOSIS — Z96641 Presence of right artificial hip joint: Secondary | ICD-10-CM | POA: Diagnosis not present

## 2018-08-03 DIAGNOSIS — J449 Chronic obstructive pulmonary disease, unspecified: Secondary | ICD-10-CM | POA: Diagnosis present

## 2018-08-03 DIAGNOSIS — K5900 Constipation, unspecified: Secondary | ICD-10-CM | POA: Diagnosis not present

## 2018-08-03 DIAGNOSIS — K219 Gastro-esophageal reflux disease without esophagitis: Secondary | ICD-10-CM | POA: Diagnosis not present

## 2018-08-03 DIAGNOSIS — F17210 Nicotine dependence, cigarettes, uncomplicated: Secondary | ICD-10-CM | POA: Diagnosis present

## 2018-08-03 LAB — CBC WITH DIFFERENTIAL/PLATELET
Abs Immature Granulocytes: 0.11 10*3/uL — ABNORMAL HIGH (ref 0.00–0.07)
Basophils Absolute: 0.1 10*3/uL (ref 0.0–0.1)
Basophils Relative: 1 %
Eosinophils Absolute: 0.6 10*3/uL — ABNORMAL HIGH (ref 0.0–0.5)
Eosinophils Relative: 5 %
HCT: 46.2 % (ref 39.0–52.0)
Hemoglobin: 14.4 g/dL (ref 13.0–17.0)
Immature Granulocytes: 1 %
Lymphocytes Relative: 24 %
Lymphs Abs: 3.2 10*3/uL (ref 0.7–4.0)
MCH: 28.2 pg (ref 26.0–34.0)
MCHC: 31.2 g/dL (ref 30.0–36.0)
MCV: 90.6 fL (ref 80.0–100.0)
Monocytes Absolute: 1.3 10*3/uL — ABNORMAL HIGH (ref 0.1–1.0)
Monocytes Relative: 9 %
Neutro Abs: 8.3 10*3/uL — ABNORMAL HIGH (ref 1.7–7.7)
Neutrophils Relative %: 60 %
Platelets: 585 10*3/uL — ABNORMAL HIGH (ref 150–400)
RBC: 5.1 MIL/uL (ref 4.22–5.81)
RDW: 17.9 % — ABNORMAL HIGH (ref 11.5–15.5)
WBC: 13.6 10*3/uL — ABNORMAL HIGH (ref 4.0–10.5)
nRBC: 0 % (ref 0.0–0.2)

## 2018-08-03 LAB — COMPREHENSIVE METABOLIC PANEL
ALT: 11 U/L (ref 0–44)
AST: 17 U/L (ref 15–41)
Albumin: 3.1 g/dL — ABNORMAL LOW (ref 3.5–5.0)
Alkaline Phosphatase: 39 U/L (ref 38–126)
Anion gap: 12 (ref 5–15)
BUN: 22 mg/dL (ref 8–23)
CO2: 24 mmol/L (ref 22–32)
Calcium: 8.6 mg/dL — ABNORMAL LOW (ref 8.9–10.3)
Chloride: 101 mmol/L (ref 98–111)
Creatinine, Ser: 1.07 mg/dL (ref 0.61–1.24)
GFR calc Af Amer: >60 mL/min (ref >60)
GFR calc non Af Amer: >60 mL/min (ref >60)
Glucose, Bld: 146 mg/dL — ABNORMAL HIGH (ref 70–99)
Potassium: 3.7 mmol/L (ref 3.5–5.1)
Sodium: 137 mmol/L (ref 135–145)
Total Bilirubin: 0.8 mg/dL (ref 0.3–1.2)
Total Protein: 9.2 g/dL — ABNORMAL HIGH (ref 6.5–8.1)

## 2018-08-03 LAB — TROPONIN I (HIGH SENSITIVITY)
Troponin I (High Sensitivity): 5 ng/L (ref <18)
Troponin I (High Sensitivity): 5 ng/L (ref <18)

## 2018-08-03 LAB — SARS CORONAVIRUS 2 BY RT PCR (HOSPITAL ORDER, PERFORMED IN ~~LOC~~ HOSPITAL LAB): SARS Coronavirus 2: NEGATIVE

## 2018-08-03 LAB — BRAIN NATRIURETIC PEPTIDE: B Natriuretic Peptide: 77 pg/mL (ref 0.0–100.0)

## 2018-08-03 MED ORDER — ENOXAPARIN SODIUM 40 MG/0.4ML ~~LOC~~ SOLN
40.0000 mg | SUBCUTANEOUS | Status: DC
  Administered 2018-08-03 – 2018-08-04 (×2): 40 mg via SUBCUTANEOUS
  Filled 2018-08-03 (×2): qty 0.4

## 2018-08-03 MED ORDER — MIRABEGRON ER 25 MG PO TB24
50.0000 mg | ORAL_TABLET | Freq: Every day | ORAL | Status: DC
  Administered 2018-08-04 – 2018-08-05 (×2): 50 mg via ORAL
  Filled 2018-08-03 (×2): qty 2

## 2018-08-03 MED ORDER — VANCOMYCIN HCL IN DEXTROSE 750-5 MG/150ML-% IV SOLN
750.0000 mg | Freq: Two times a day (BID) | INTRAVENOUS | Status: DC
  Administered 2018-08-04: 750 mg via INTRAVENOUS
  Filled 2018-08-03: qty 150

## 2018-08-03 MED ORDER — SODIUM CHLORIDE 0.9 % IV SOLN
2.0000 g | Freq: Once | INTRAVENOUS | Status: AC
  Administered 2018-08-03: 2 g via INTRAVENOUS
  Filled 2018-08-03: qty 20

## 2018-08-03 MED ORDER — GABAPENTIN 300 MG PO CAPS
300.0000 mg | ORAL_CAPSULE | Freq: Three times a day (TID) | ORAL | Status: DC
  Administered 2018-08-04 – 2018-08-05 (×5): 300 mg via ORAL
  Filled 2018-08-03 (×5): qty 1

## 2018-08-03 MED ORDER — ASPIRIN EC 81 MG PO TBEC
81.0000 mg | DELAYED_RELEASE_TABLET | Freq: Every day | ORAL | Status: DC
  Administered 2018-08-04 – 2018-08-05 (×2): 81 mg via ORAL
  Filled 2018-08-03 (×2): qty 1

## 2018-08-03 MED ORDER — SODIUM CHLORIDE 0.9 % IV SOLN
2.0000 g | Freq: Two times a day (BID) | INTRAVENOUS | Status: DC
  Administered 2018-08-03 – 2018-08-04 (×3): 2 g via INTRAVENOUS
  Filled 2018-08-03 (×3): qty 2

## 2018-08-03 MED ORDER — SIMVASTATIN 10 MG PO TABS
10.0000 mg | ORAL_TABLET | Freq: Every day | ORAL | Status: DC
  Administered 2018-08-04: 10 mg via ORAL
  Filled 2018-08-03: qty 1

## 2018-08-03 MED ORDER — SODIUM CHLORIDE 0.9 % IV SOLN
500.0000 mg | INTRAVENOUS | Status: DC
  Administered 2018-08-03 – 2018-08-04 (×2): 500 mg via INTRAVENOUS
  Filled 2018-08-03 (×2): qty 500

## 2018-08-03 MED ORDER — PANTOPRAZOLE SODIUM 40 MG PO TBEC
40.0000 mg | DELAYED_RELEASE_TABLET | Freq: Every day | ORAL | Status: DC
  Administered 2018-08-04 – 2018-08-05 (×2): 40 mg via ORAL
  Filled 2018-08-03 (×2): qty 1

## 2018-08-03 MED ORDER — FLUTICASONE PROPIONATE 50 MCG/ACT NA SUSP
2.0000 | Freq: Every day | NASAL | Status: DC
  Administered 2018-08-04 – 2018-08-05 (×2): 2 via NASAL
  Filled 2018-08-03: qty 16

## 2018-08-03 MED ORDER — PREDNISONE 50 MG PO TABS
60.0000 mg | ORAL_TABLET | Freq: Once | ORAL | Status: AC
  Administered 2018-08-03: 60 mg via ORAL
  Filled 2018-08-03: qty 1

## 2018-08-03 MED ORDER — OXYCODONE HCL 5 MG PO TABS
10.0000 mg | ORAL_TABLET | Freq: Four times a day (QID) | ORAL | Status: DC | PRN
  Administered 2018-08-04: 10 mg via ORAL
  Filled 2018-08-03: qty 2

## 2018-08-03 MED ORDER — SODIUM CHLORIDE 0.9 % IV SOLN
INTRAVENOUS | Status: DC
  Administered 2018-08-03: 23:00:00 via INTRAVENOUS

## 2018-08-03 MED ORDER — VANCOMYCIN HCL 10 G IV SOLR
INTRAVENOUS | Status: AC
  Filled 2018-08-03: qty 1500

## 2018-08-03 MED ORDER — LISINOPRIL 10 MG PO TABS
20.0000 mg | ORAL_TABLET | Freq: Every day | ORAL | Status: DC
  Administered 2018-08-04 – 2018-08-05 (×2): 20 mg via ORAL
  Filled 2018-08-03 (×2): qty 2

## 2018-08-03 MED ORDER — CYCLOBENZAPRINE HCL 10 MG PO TABS
5.0000 mg | ORAL_TABLET | Freq: Three times a day (TID) | ORAL | Status: DC
  Administered 2018-08-04 – 2018-08-05 (×5): 5 mg via ORAL
  Filled 2018-08-03 (×5): qty 1

## 2018-08-03 MED ORDER — ALBUTEROL SULFATE HFA 108 (90 BASE) MCG/ACT IN AERS
2.0000 | INHALATION_SPRAY | Freq: Once | RESPIRATORY_TRACT | Status: AC
  Administered 2018-08-03: 2 via RESPIRATORY_TRACT
  Filled 2018-08-03: qty 6.7

## 2018-08-03 MED ORDER — METOPROLOL SUCCINATE ER 25 MG PO TB24
25.0000 mg | ORAL_TABLET | Freq: Every day | ORAL | Status: DC
  Administered 2018-08-04 – 2018-08-05 (×2): 25 mg via ORAL
  Filled 2018-08-03 (×2): qty 1

## 2018-08-03 MED ORDER — POLYETHYLENE GLYCOL 3350 17 G PO PACK: 17.0000 g | PACK | Freq: Every day | ORAL | Status: DC | PRN

## 2018-08-03 MED ORDER — AMLODIPINE BESYLATE 5 MG PO TABS
5.0000 mg | ORAL_TABLET | Freq: Every day | ORAL | Status: DC
  Administered 2018-08-04 – 2018-08-05 (×2): 5 mg via ORAL
  Filled 2018-08-03 (×2): qty 1

## 2018-08-03 MED ORDER — FERROUS SULFATE 325 (65 FE) MG PO TABS
325.0000 mg | ORAL_TABLET | Freq: Every day | ORAL | Status: DC
  Administered 2018-08-04 – 2018-08-05 (×2): 325 mg via ORAL
  Filled 2018-08-03 (×2): qty 1

## 2018-08-03 MED ORDER — IPRATROPIUM-ALBUTEROL 0.5-2.5 (3) MG/3ML IN SOLN
3.0000 mL | Freq: Three times a day (TID) | RESPIRATORY_TRACT | Status: DC
  Administered 2018-08-04 – 2018-08-05 (×4): 3 mL via RESPIRATORY_TRACT
  Filled 2018-08-03 (×5): qty 3

## 2018-08-03 MED ORDER — MONTELUKAST SODIUM 10 MG PO TABS
10.0000 mg | ORAL_TABLET | Freq: Every day | ORAL | Status: DC
  Administered 2018-08-04 – 2018-08-05 (×2): 10 mg via ORAL
  Filled 2018-08-03 (×2): qty 1

## 2018-08-03 MED ORDER — SENNA 8.6 MG PO TABS
1.0000 | ORAL_TABLET | Freq: Every day | ORAL | Status: DC
  Administered 2018-08-04 – 2018-08-05 (×2): 8.6 mg via ORAL
  Filled 2018-08-03 (×2): qty 1

## 2018-08-03 MED ORDER — METOCLOPRAMIDE HCL 10 MG PO TABS
5.0000 mg | ORAL_TABLET | Freq: Three times a day (TID) | ORAL | Status: DC
  Administered 2018-08-04 – 2018-08-05 (×6): 5 mg via ORAL
  Filled 2018-08-03 (×6): qty 1

## 2018-08-03 MED ORDER — VANCOMYCIN HCL 1.5 G IV SOLR
1500.0000 mg | Freq: Once | INTRAVENOUS | Status: AC
  Administered 2018-08-04: 1500 mg via INTRAVENOUS
  Filled 2018-08-03: qty 1500

## 2018-08-03 MED ORDER — ALBUTEROL SULFATE (2.5 MG/3ML) 0.083% IN NEBU
2.5000 mg | INHALATION_SOLUTION | RESPIRATORY_TRACT | Status: DC | PRN
  Administered 2018-08-04: 2.5 mg via RESPIRATORY_TRACT
  Filled 2018-08-03: qty 3

## 2018-08-03 NOTE — ED Notes (Signed)
Lab in room.

## 2018-08-03 NOTE — ED Triage Notes (Signed)
Increased sob since last night.  Hx of lung ca.

## 2018-08-03 NOTE — H&P (Addendum)
TRH H&P    Patient Demographics:    Bradley [name], is a 78 y.o. male  MRN: [id number]  DOB - 1940/02/11  Admit Date - 08/03/2018  Referring MD/NP/PA:  Gerlene Fee  Outpatient Primary MD for the patient is Jani Gravel, MD Dr. Delton Coombes - oncology  Patient coming from: home  Chief complaint-  dyspnea   HPI:    Bradley [name]  is a 78 y.o. 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  Pt was seen by home health RN today and told him that his o2 sat was low and to wear his oxygen. His daughter apparently told him to go to ER for evaluation of dyspnea.  Pt denies fever, chills, cp, palp, n/v, abd pain, diarrhea, brbpr.     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 Diffuse increase in interstitial and patchy opacity throughout both lungs since prior exam. This may be represent pulmonary edema versus multifocal  pneumonia, including atypical viral infection. 2. Left lung cancer. Chronic volume loss in the left hemithorax. Left PleurX catheter in place, unchanged blunting of left costophrenic angle.  Covid 19 negative  Wbc13.6, Hgb 14.4, Plt 585  Na 137, K 3.7, Bun 22, Creatinine 1.07 Calcium 8.6,  Ast 17, Alt 11  Trop 5.0 BNP 77  Blood culture x 2 pending  Pt received rocephin 2gm iv x1, and zithromax 500mg  iv x1 in ED,   Pt will be admitted for acute respiratory failure with hypoxia secondary to to HCAP      Review of systems:    In addition to the HPI above,  No Fever-chills, No Headache, No changes with Vision or hearing, No problems swallowing food or Liquids, No Chest pain, No Abdominal pain, No Nausea or Vomiting, bowel movements are regular, No Blood in stool or Urine, No dysuria, No new skin rashes or bruises, No new joints pains-aches,  No new weakness, tingling, numbness in any extremity, No recent weight gain or loss, No polyuria, polydypsia or polyphagia, No significant Mental Stressors.  All other systems reviewed and are negative.  Past History of the following :    Past Medical History:  Diagnosis Date  . Cancer ([place])    lung  . Chronic lower back pain   . Chronic pain    pain management  . Contact with stonefish as cause of accidental injury 1954   "stung across my left hand in [place]; LUE weaker since"  . COPD (chronic obstructive pulmonary disease) ([place])   . Coronary artery disease   . Dyspnea    with exertion  . High cholesterol   . History of blood transfusion 1943  . History of kidney stones    passed  . Hypertension   . Mass of left lung   . Migraine    "none since the 1990s" (09/11/2016)  . Myocardial infarction ([place]) 01/20/1993; 02/28/1993  . Neuromuscular disorder (H[facility])    neuropathy left  . On home O2   . Pneumonia    "several times" (09/11/2016)  . Tobacco abuse    ongoing      Past Surgical History:  Procedure Laterality  Date  . APPENDECTOMY    . CARDIAC CATHETERIZATION    . CATARACT EXTRACTION, BILATERAL Bilateral   . CHEST TUBE INSERTION Left 06/28/2018   Procedure: INSERTION PLEURAL DRAINAGE CATHETER;  Surgeon: Ivin Poot, MD;  Location: [facility];  Service: Thoracic;  Laterality: Left;  . COLONOSCOPY    . HIP ARTHROPLASTY Right 12/20/2016   Procedure: ARTHROPLASTY BIPOLAR HIP (HEMIARTHROPLASTY);  Surgeon: [name], MD;  Location: [facility];  Service: Orthopedics;  Laterality: Right;  . IR THORACENTESIS ASP PLEURAL SPACE W/IMG GUIDE  06/15/2018  . JOINT REPLACEMENT    . LEFT HEART CATH AND CORONARY ANGIOGRAPHY N/A 09/12/2016   Procedure: LEFT HEART CATH AND CORONARY ANGIOGRAPHY;  Surgeon: Burnell Blanks, MD;  Location: [facility];  Service: Cardiovascular;  Laterality: N/A;  . NASAL ENDOSCOPY WITH EPISTAXIS CONTROL Right 03/15/2016   Procedure: NASAL ENDOSCOPY WITH EPISTAXIS CONTROL;  Surgeon: Jodi Marble, MD;  Location: [facility];  Service: ENT;  Laterality: Right;  . PORTACATH PLACEMENT Right 04/23/2018   Procedure: INSERTION PORT-A-CATH;  Surgeon: Aviva Signs, MD;  Location: [facility];  Service: General;  Laterality: Right;  . SEPTOPLASTY Right 03/15/2016   Procedure: SEPTOPLASTY;  Surgeon: Jodi Marble, MD;  Location: [facility];  Service: ENT;  Laterality: Right;  . SINUS EXPLORATION Right 03/15/2016   Procedure: SINUS EXPLORATION;  Surgeon: Jodi Marble, MD;  Location: [facility];  Service: ENT;  Laterality: Right;  . TONSILLECTOMY    . TOOTH EXTRACTION    . TOTAL KNEE ARTHROPLASTY Left   . VIDEO BRONCHOSCOPY WITH ENDOBRONCHIAL ULTRASOUND N/A 12/15/2017   Procedure: VIDEO BRONCHOSCOPY WITH ENDOBRONCHIAL ULTRASOUND;  Surgeon: Rigoberto Noel, MD;  Location: [facility];  Service: Thoracic;  Laterality: N/A;      Social History:      Social History   Tobacco Use  . Smoking status: Current Every Day Smoker    Packs/day: 1.00    Years: 70.00    Pack years: 70.00    Types: Cigarettes  .  Smokeless tobacco: Never Used  . Tobacco comment: smokes 8 a day  Substance Use Topics  . Alcohol use: No    Comment: 11/26/2017  "drank from the age of 26 til 03/05/1993"       Family History :     Family History  Adopted: Yes  Problem Relation Age of Onset  . Cancer Mother   . Obesity Father  Home Medications:   Prior to Admission medications   Medication Sig Start Date End Date Taking? Authorizing Provider  ADVAIR HFA 115-21 MCG/ACT inhaler Inhale 2 puffs into the lungs every 12 (twelve) hours. 01/29/16   [provider]  amLODipine (NORVASC) 5 MG tablet TAKE 1 TABLET BY MOUTH EVERY DAY Patient taking differently: Take 5 mg by mouth daily.  08/24/17   Lendon Colonel, NP  Ascorbic Acid (VITAMIN C) 500 MG CAPS Take 500 mg by mouth daily.    [provider]  aspirin EC 81 MG tablet Take 81 mg by mouth daily.     [provider]  cyclobenzaprine (FLEXERIL) 5 MG tablet Take 5 mg by mouth 3 (three) times daily.     [provider]  Ferrous Sulfate (IRON) 325 (65 Fe) MG TABS Take 1 tablet by mouth daily. 12/10/15   [provider]  fluticasone (FLONASE) 50 MCG/ACT nasal spray Place 2 sprays into both nostrils daily. 12/11/17   [provider]  gabapentin (NEURONTIN) 300 MG capsule Take 1 capsule by mouth 3 (three) times daily. 08/15/16   [provider]  Ipilimumab (YERVOY IV) Inject into the vein every 6 (six) weeks.    [provider]  ipratropium-albuterol (DUONEB) 0.5-2.5 (3) MG/3ML SOLN Take 3 mLs by nebulization 3 (three) times daily. 01/01/18   Johnson, Clanford L, MD  lidocaine-prilocaine (EMLA) cream Apply 1 application topically as needed. Apply a small amount to port site and cover with plastic wrap 1 hour prior to infusion appointments 04/23/18   Derek Jack, MD  lisinopril (ZESTRIL) 20 MG tablet Take 1 tablet (20 mg total) by mouth daily. 06/18/18   Aline August, MD  metoCLOPramide  (REGLAN) 5 MG tablet Take 1 tablet by mouth 4 (four) times daily -  before meals and at bedtime. 01/25/16   [provider]  metoprolol succinate (TOPROL-XL) 25 MG 24 hr tablet Take 25 mg by mouth daily.    [provider]  montelukast (SINGULAIR) 10 MG tablet Take 1 tablet by mouth daily. 06/02/18   [provider]  Multiple Vitamins-Minerals (MULTIVITAMIN WITH MINERALS) tablet Take 1 tablet by mouth daily.    [provider]  MYRBETRIQ 50 MG TB24 tablet Take 1 tablet by mouth daily. 06/12/18   [provider]  nitroGLYCERIN (NITROSTAT) 0.4 MG SL tablet Place 0.4 mg under the tongue every 5 (five) minutes as needed for chest pain.    [provider]  Nivolumab (OPDIVO IV) Inject into the vein every 14 (fourteen) days.    [provider]  Oxycodone HCl 10 MG TABS Take 1 tablet by mouth every 6 (six) hours. 06/05/18   [provider]  polyethylene glycol (MIRALAX / GLYCOLAX) 17 g packet Take 17 g by mouth daily as needed for mild constipation. 06/18/18   Aline August, MD  potassium gluconate (HM POTASSIUM) 595 (99 K) MG TABS tablet Take 595 mg by mouth daily.    [provider]  PRILOSEC 40 MG capsule Take 1 capsule by mouth daily. 03/31/18   [provider]  senna (SENOKOT) 8.6 MG TABS tablet Take 1 tablet (8.6 mg total) by mouth daily. 06/18/18   Aline August, MD  simvastatin (ZOCOR) 10 MG tablet Take 10 mg by mouth at bedtime.  12/23/15   [provider]  VENTOLIN HFA 108 (90 Base) MCG/ACT inhaler Inhale 2 puffs into the lungs every 4 (four) hours as needed for up to 30 days for wheezing or shortness of  breath. 06/30/18 07/30/18  Elodia Florence., MD     Allergies:     Allergies  Allergen Reactions  . Aspirin     Regular asa      Physical Exam:   Vitals  Blood pressure 112/60, pulse 83, temperature 98.4 F (36.9 C), temperature source Oral, resp. rate (!) 28, height 6' (1.829 m), weight  70.3 kg, SpO2 90 %.  1.  General: axoxo3  2. Psychiatric: euthymic  3. Neurologic: cn2-12 intact, reflexes 2+ symmetric, diffuse with no clonus, motor 5/5 in all 4 ext  4. HEENMT:  Anicteric, pupils 1.6mm symmetric, direct, consensual, near intact Neck: no jvd, no bruit  5. Respiratory : Crackles right lung base, crackles left upper lung, no wheezing Left pleurex catheter  6. Cardiovascular : rrr s1, s2, no m/g/r  7. Gastrointestinal:  Abd: soft, nt, nd, +bs  8. Skin:  Ext: no c/c/e,  No rash  9.Musculoskeletal:  Good ROM  No adenoapthy    Data Review:    CBC Recent Labs  Lab 08/03/18 1807  WBC 13.6*  HGB 14.4  HCT 46.2  PLT 585*  MCV 90.6  MCH 28.2  MCHC 31.2  RDW 17.9*  LYMPHSABS 3.2  MONOABS 1.3*  EOSABS 0.6*  BASOSABS 0.1   ------------------------------------------------------------------------------------------------------------------  Results for orders placed or performed during the hospital encounter of 08/03/18 (from the past 48 hour(s))  CBC with Differential     Status: Abnormal   Collection Time: 08/03/18  6:07 PM  Result Value Ref Range   WBC 13.6 (H) 4.0 - 10.5 K/uL   RBC 5.10 4.22 - 5.81 MIL/uL   Hemoglobin 14.4 13.0 - 17.0 g/dL   HCT 46.2 39.0 - 52.0 %   MCV 90.6 80.0 - 100.0 fL   MCH 28.2 26.0 - 34.0 pg   MCHC 31.2 30.0 - 36.0 g/dL   RDW 17.9 (H) 11.5 - 15.5 %   Platelets 585 (H) 150 - 400 K/uL   nRBC 0.0 0.0 - 0.2 %   Neutrophils Relative % 60 %   Neutro Abs 8.3 (H) 1.7 - 7.7 K/uL   Lymphocytes Relative 24 %   Lymphs Abs 3.2 0.7 - 4.0 K/uL   Monocytes Relative 9 %   Monocytes Absolute 1.3 (H) 0.1 - 1.0 K/uL   Eosinophils Relative 5 %   Eosinophils Absolute 0.6 (H) 0.0 - 0.5 K/uL   Basophils Relative 1 %   Basophils Absolute 0.1 0.0 - 0.1 K/uL   Immature Granulocytes 1 %   Abs Immature Granulocytes 0.11 (H) 0.00 - 0.07 K/uL    Comment: Performed at [facility], [address], [place], [place] [address]  Comprehensive  metabolic panel     Status: Abnormal   Collection Time: 08/03/18  6:07 PM  Result Value Ref Range   Sodium 137 135 - 145 mmol/L   Potassium 3.7 3.5 - 5.1 mmol/L   Chloride 101 98 - 111 mmol/L   CO2 24 22 - 32 mmol/L   Glucose, Bld 146 (H) 70 - 99 mg/dL   BUN 22 8 - 23 mg/dL   Creatinine, Ser 1.07 0.61 - 1.24 mg/dL   Calcium 8.6 (L) 8.9 - 10.3 mg/dL   Total Protein 9.2 (H) 6.5 - 8.1 g/dL   Albumin 3.1 (L) 3.5 - 5.0 g/dL   AST 17 15 - 41 U/L   ALT 11 0 - 44 U/L   Alkaline Phosphatase 39 38 - 126 U/L   Total Bilirubin 0.8 0.3 - 1.2  mg/dL   GFR calc non Af Amer >60 >60 mL/min   GFR calc Af Amer >60 >60 mL/min   Anion gap 12 5 - 15    Comment: Performed at [facility], [address] [place], [place] [address]  Troponin I (High Sensitivity)     Status: None   Collection Time: 08/03/18  6:07 PM  Result Value Ref Range   Troponin I (High Sensitivity) 5.00 <18 ng/L    Comment: (NOTE) Elevated high sensitivity troponin I (hsTnI) values and significant  changes across serial measurements may suggest ACS but many other  chronic and acute conditions are known to elevate hsTnI results.  Refer to the "Links" section for chest pain algorithms and additional  guidance. Performed at [facility], [address] [place], [place] [address]   Brain natriuretic peptide     Status: None   Collection Time: 08/03/18  6:31 PM  Result Value Ref Range   B Natriuretic Peptide 77.0 0.0 - 100.0 pg/mL    Comment: Performed at [facility], [address] [place], [place] [address]    Chemistries  Recent Labs  Lab 08/03/18 1807  NA 137  K 3.7  CL 101  CO2 24  GLUCOSE 146*  BUN 22  CREATININE 1.07  CALCIUM 8.6*  AST 17  ALT 11  ALKPHOS 39  BILITOT 0.8   ------------------------------------------------------------------------------------------------------------------  ------------------------------------------------------------------------------------------------------------------ GFR:  Estimated Creatinine Clearance: 57.5 mL/min (by C-G formula based on SCr of 1.07 mg/dL). Liver Function Tests: Recent Labs  Lab 08/03/18 1807  AST 17  ALT 11  ALKPHOS 39  BILITOT 0.8  PROT 9.2*  ALBUMIN 3.1*   No results for input(s): LIPASE, AMYLASE in the last 168 hours. No results for input(s): AMMONIA in the last 168 hours. Coagulation Profile: No results for input(s): INR, PROTIME in the last 168 hours. Cardiac Enzymes: No results for input(s): CKTOTAL, CKMB, CKMBINDEX, TROPONINI in the last 168 hours. BNP (last 3 results) No results for input(s): PROBNP in the last 8760 hours. HbA1C: No results for input(s): HGBA1C in the last 72 hours. CBG: No results for input(s): GLUCAP in the last 168 hours. Lipid Profile: No results for input(s): CHOL, HDL, LDLCALC, TRIG, CHOLHDL, LDLDIRECT in the last 72 hours. Thyroid Function Tests: No results for input(s): TSH, T4TOTAL, FREET4, T3FREE, THYROIDAB in the last 72 hours. Anemia Panel: No results for input(s): VITAMINB12, FOLATE, FERRITIN, TIBC, IRON, RETICCTPCT in the last 72 hours.  --------------------------------------------------------------------------------------------------------------- Urine analysis:    Component Value Date/Time   COLORURINE AMBER (A) 06/25/2018 0904   APPEARANCEUR HAZY (A) 06/25/2018 0904   LABSPEC >1.030 (H) 06/25/2018 0904   PHURINE 5.5 06/25/2018 0904   GLUCOSEU NEGATIVE 06/25/2018 0904   HGBUR NEGATIVE 06/25/2018 0904   BILIRUBINUR NEGATIVE 06/25/2018 0904   KETONESUR TRACE (A) 06/25/2018 0904   PROTEINUR NEGATIVE 06/25/2018 0904   NITRITE NEGATIVE 06/25/2018 0904   LEUKOCYTESUR NEGATIVE 06/25/2018 0904      Imaging Results:    Dg Chest Port 1 View  Result Date: 08/03/2018 CLINICAL DATA:  Progressive shortness of breath since yesterday. Generalized weakness and dizziness. Lung cancer with active chemotherapy. EXAM: PORTABLE CHEST 1 VIEW COMPARISON:  Most recent radiograph 07/23/2018. Most  recent CT 06/25/2018 FINDINGS: Right chest port with tip in the mid SVC. Left PleurX catheter in place. Volume loss in the left hemithorax with minimal blunting of left costophrenic angle, similar to prior. Diffuse increase in interstitial and patchy opacity throughout both lungs since prior exam. Unchanged heart size and  mediastinal contours with left hilar fullness. Underlying emphysema. No evidence of large right pleural effusion. No visualized pneumothorax. IMPRESSION: 1. Diffuse increase in interstitial and patchy opacity throughout both lungs since prior exam. This may be represent pulmonary edema versus multifocal pneumonia, including atypical viral infection. 2. Left lung cancer. Chronic volume loss in the left hemithorax. Left PleurX catheter in place, unchanged blunting of left costophrenic angle. Electronically Signed   By: Keith Rake M.D.   On: 08/03/2018 19:05       Assessment & Plan:    Principal Problem:   Acute respiratory failure with hypoxia (HCC) Active Problems:   Tobacco abuse   COPD (chronic obstructive pulmonary disease) (HCC)   CAD (coronary artery disease), native coronary artery   Essential hypertension   HCAP (healthcare-associated pneumonia)   Hyperglycemia  Acute respiratory failure with hypoxia secondary to multifocal pneumonia, Hcap  Blood culture x2 Urine strep antigen Urine legionella antigen MRSA pcr screen, if negative consider d/c vanco vanco iv, cefepime iv pharmacy to dose zithromax 500mg  iv qday  Copd  On home o2 3L [place] prn DC Advair-> (increase in risk for pneumonia) Cont Duoneb 1 po tid  Cont Ventolin HFA 2puff q6h prn  Cont Singulair 10mg  po qday  Hyperglycemia Check hga1c  CAD  Cont Aspirin  Cont Toprol XL 25mg  po qday Cont Linsinopril 20mg  po qday Cont Amlodipine 5mg  po qday Cont Simvastatin 10mg  po qhs  Chronic back pain Cont gabapentin Cont oxycodone  Gerd Cont PPI  Constipation Cont Miralax prn   Metastatic Lung  cancer (liver) Cont Opdivo, [place] as outpatient F/u with Dr. Delton Coombes  Tobacco dependence Nicotine patch 21 mg topically qday prn  Pt counseled on smoking cessation x 3 minutes   DVT Prophylaxis-   Lovenox - SCDs   AM Labs Ordered, also please review Full Orders  Family Communication: Admission, patients condition and plan of care including tests being ordered have been discussed with the patient and daughter who indicate understanding and agree with the plan and Code Status.  Code Status:  DNR, notified daughter of fathers admission to room [facility] at [facility] for [facility]  Admission status:   Inpatient: Based on patients clinical presentation and evaluation of above clinical data, I have made determination that patient meets Inpatient criteria at this time. Pt has high risk of clinical deterioration, pt has multifocal pneumonia (hcap),  Pt will require iv abx and cultures.  And therefore need > 2 nites stay  Time spent in minutes : 70   Jani Gravel M.D on 08/03/2018 at 8:28 PM

## 2018-08-03 NOTE — ED Notes (Signed)
ED TO INPATIENT HANDOFF REPORT  ED Nurse Name and Phone #:  Lonn Georgia [phone]  S Name/Age/Gender Bradley [name] 78 y.o. male Room/Bed: APA11/APA11  Code Status   Code Status: Prior  Home/SNF/Other Home Patient oriented to: self, place, time and situation Is this baseline? Yes   Triage Complete: Triage complete  Chief Complaint Shortness of Breath  Triage Note Increased sob since last night.  Hx of lung ca.     Allergies Allergies  Allergen Reactions  . Aspirin     Regular asa     Level of Care/Admitting Diagnosis ED Disposition    ED Disposition Condition [facility]: [facility] [[id number]]  Level of Care: Telemetry [5]  Covid Evaluation: Person Under Investigation (PUI)  Diagnosis: Acute respiratory failure with hypoxia [facility]) [[id number]]  Admitting Physician: Jani Gravel [[id number]]  Attending Physician: Jani Gravel [phone]  Estimated length of stay: past midnight tomorrow  Certification:: I certify this patient will need inpatient services for at least 2 midnights  PT Class (Do Not Modify): Inpatient [101]  PT Acc Code (Do Not Modify): Private [1]       B Medical/Surgery History Past Medical History:  Diagnosis Date  . Cancer ([place])    lung  . Chronic lower back pain   . Chronic pain    pain management  . Contact with stonefish as cause of accidental injury 1954   "stung across my left hand in [place]; LUE weaker since"  . COPD (chronic obstructive pulmonary disease) ([place])   . Coronary artery disease   . Dyspnea    with exertion  . High cholesterol   . History of blood transfusion 1943  . History of kidney stones    passed  . Hypertension   . Mass of left lung   . Migraine    "none since the 1990s" (09/11/2016)  . Myocardial infarction ([place]) 01/20/1993; 02/28/1993  . Neuromuscular disorder ([facility])    neuropathy left  . On home O2   . Pneumonia    "several times" (09/11/2016)  . Tobacco abuse    ongoing   Past Surgical History:   Procedure Laterality Date  . APPENDECTOMY    . CARDIAC CATHETERIZATION    . CATARACT EXTRACTION, BILATERAL Bilateral   . CHEST TUBE INSERTION Left 06/28/2018   Procedure: INSERTION PLEURAL DRAINAGE CATHETER;  Surgeon: Ivin Poot, MD;  Location: [place];  Service: Thoracic;  Laterality: Left;  . COLONOSCOPY    . HIP ARTHROPLASTY Right 12/20/2016   Procedure: ARTHROPLASTY BIPOLAR HIP (HEMIARTHROPLASTY);  Surgeon: [name], MD;  Location: [place];  Service: Orthopedics;  Laterality: Right;  . IR THORACENTESIS ASP PLEURAL SPACE W/IMG GUIDE  06/15/2018  . JOINT REPLACEMENT    . LEFT HEART CATH AND CORONARY ANGIOGRAPHY N/A 09/12/2016   Procedure: LEFT HEART CATH AND CORONARY ANGIOGRAPHY;  Surgeon: Burnell Blanks, MD;  Location: [place] [facility];  Service: Cardiovascular;  Laterality: N/A;  . NASAL ENDOSCOPY WITH EPISTAXIS CONTROL Right 03/15/2016   Procedure: NASAL ENDOSCOPY WITH EPISTAXIS CONTROL;  Surgeon: Jodi Marble, MD;  Location: [place];  Service: ENT;  Laterality: Right;  . PORTACATH PLACEMENT Right 04/23/2018   Procedure: INSERTION PORT-A-CATH;  Surgeon: Aviva Signs, MD;  Location: [facility];  Service: General;  Laterality: Right;  . SEPTOPLASTY Right 03/15/2016   Procedure: SEPTOPLASTY;  Surgeon: Jodi Marble, MD;  Location: [place];  Service: ENT;  Laterality: Right;  . SINUS EXPLORATION Right 03/15/2016   Procedure: SINUS EXPLORATION;  Surgeon: Jodi Marble, MD;  Location: [place];  Service: ENT;  Laterality: Right;  . TONSILLECTOMY    . TOOTH EXTRACTION    . TOTAL KNEE ARTHROPLASTY Left   . VIDEO BRONCHOSCOPY WITH ENDOBRONCHIAL ULTRASOUND N/A 12/15/2017   Procedure: VIDEO BRONCHOSCOPY WITH ENDOBRONCHIAL ULTRASOUND;  Surgeon: Rigoberto Noel, MD;  Location: [place] [facility];  Service: Thoracic;  Laterality: N/A;     A IV Location/Drains/Wounds Patient Lines/Drains/Airways Status   Active Line/Drains/Airways    Name:   Placement date:   Placement time:   Site:   Days:   Implanted  Port Right Chest   -    -    Chest      Peripheral IV 08/03/18 Right Antecubital   08/03/18    1808    Antecubital   less than 1   Incision (Closed) 06/28/18 Chest Left   06/28/18    0731     36          Intake/Output Last 24 hours No intake or output data in the 24 hours ending 08/03/18 2110  Labs/Imaging Results for orders placed or performed during the hospital encounter of 08/03/18 (from the past 48 hour(s))  CBC with Differential     Status: Abnormal   Collection Time: 08/03/18  6:07 PM  Result Value Ref Range   WBC 13.6 (H) 4.0 - 10.5 K/uL   RBC 5.10 4.22 - 5.81 MIL/uL   Hemoglobin 14.4 13.0 - 17.0 g/dL   HCT 46.2 39.0 - 52.0 %   MCV 90.6 80.0 - 100.0 fL   MCH 28.2 26.0 - 34.0 pg   MCHC 31.2 30.0 - 36.0 g/dL   RDW 17.9 (H) 11.5 - 15.5 %   Platelets 585 (H) 150 - 400 K/uL   nRBC 0.0 0.0 - 0.2 %   Neutrophils Relative % 60 %   Neutro Abs 8.3 (H) 1.7 - 7.7 K/uL   Lymphocytes Relative 24 %   Lymphs Abs 3.2 0.7 - 4.0 K/uL   Monocytes Relative 9 %   Monocytes Absolute 1.3 (H) 0.1 - 1.0 K/uL   Eosinophils Relative 5 %   Eosinophils Absolute 0.6 (H) 0.0 - 0.5 K/uL   Basophils Relative 1 %   Basophils Absolute 0.1 0.0 - 0.1 K/uL   Immature Granulocytes 1 %   Abs Immature Granulocytes 0.11 (H) 0.00 - 0.07 K/uL    Comment: Performed at [facility], [address] [place], [place] [address]  Comprehensive metabolic panel     Status: Abnormal   Collection Time: 08/03/18  6:07 PM  Result Value Ref Range   Sodium 137 135 - 145 mmol/L   Potassium 3.7 3.5 - 5.1 mmol/L   Chloride 101 98 - 111 mmol/L   CO2 24 22 - 32 mmol/L   Glucose, Bld 146 (H) 70 - 99 mg/dL   BUN 22 8 - 23 mg/dL   Creatinine, Ser 1.07 0.61 - 1.24 mg/dL   Calcium 8.6 (L) 8.9 - 10.3 mg/dL   Total Protein 9.2 (H) 6.5 - 8.1 g/dL   Albumin 3.1 (L) 3.5 - 5.0 g/dL   AST 17 15 - 41 U/L   ALT 11 0 - 44 U/L   Alkaline Phosphatase 39 38 - 126 U/L   Total Bilirubin 0.8 0.3 - 1.2 mg/dL   GFR calc non Af Amer >60 >60  mL/min   GFR calc Af Amer >60 >60 mL/min   Anion gap 12 5 - 15    Comment: Performed at [facility], [address], [place], [place] [address]  Troponin I (High Sensitivity)     Status: None   Collection Time: 08/03/18  6:07 PM  Result Value Ref Range   Troponin I (High Sensitivity) 5.00 <18 ng/L    Comment: (NOTE) Elevated high sensitivity troponin I (hsTnI) values and significant  changes across serial measurements may suggest ACS but many other  chronic and acute conditions are known to elevate hsTnI results.  Refer to the "Links" section for chest pain algorithms and additional  guidance. Performed at [facility], [address] [place], [place] [address]   Brain natriuretic peptide     Status: None   Collection Time: 08/03/18  6:31 PM  Result Value Ref Range   B Natriuretic Peptide 77.0 0.0 - 100.0 pg/mL    Comment: Performed at [facility], [address], [place], [place] [address]  SARS Coronavirus 2 (CEPHEID - Performed in [place] hospital lab), Hosp Order     Status: None   Collection Time: 08/03/18  7:41 PM   Specimen: Nasopharyngeal Swab  Result Value Ref Range   SARS Coronavirus 2 NEGATIVE NEGATIVE    Comment: (NOTE) If result is NEGATIVE SARS-CoV-2 target nucleic acids are NOT DETECTED. The SARS-CoV-2 RNA is generally detectable in upper and lower  respiratory specimens during the acute phase of infection. The lowest  concentration of SARS-CoV-2 viral copies this assay can detect is 250  copies / mL. A negative result does not preclude SARS-CoV-2 infection  and should not be used as the sole basis for treatment or other  patient management decisions.  A negative result may occur with  improper specimen collection / handling, submission of specimen other  than nasopharyngeal swab, presence of viral mutation(s) within the  areas targeted by this assay, and inadequate number of viral copies  (<250 copies / mL). A negative result must be combined with clinical   observations, patient history, and epidemiological information. If result is POSITIVE SARS-CoV-2 target nucleic acids are DETECTED. The SARS-CoV-2 RNA is generally detectable in upper and lower  respiratory specimens dur ing the acute phase of infection.  Positive  results are indicative of active infection with SARS-CoV-2.  Clinical  correlation with patient history and other diagnostic information is  necessary to determine patient infection status.  Positive results do  not rule out bacterial infection or co-infection with other viruses. If result is PRESUMPTIVE POSTIVE SARS-CoV-2 nucleic acids MAY BE PRESENT.   A presumptive positive result was obtained on the submitted specimen  and confirmed on repeat testing.  While 2019 novel coronavirus  (SARS-CoV-2) nucleic acids may be present in the submitted sample  additional confirmatory testing may be necessary for epidemiological  and / or clinical management purposes  to differentiate between  SARS-CoV-2 and other Sarbecovirus currently known to infect humans.  If clinically indicated additional testing with an alternate test  methodology [phone]) is advised. The SARS-CoV-2 RNA is generally  detectable in upper and lower respiratory sp ecimens during the acute  phase of infection. The expected result is Negative. Fact Sheet for Patients:  StrictlyIdeas.no Fact Sheet for Healthcare Providers: BankingDealers.co.za This test is not yet approved or cleared by the [place] FDA and has been authorized for detection and/or diagnosis of SARS-CoV-2 by FDA under an Emergency Use Authorization (EUA).  This EUA will remain in effect (meaning this test can be used) for the duration of the COVID-19 declaration under Section 564(b)(1) of the Act, 21 U.S.C. section 360bbb-3(b)(1), unless  the authorization is terminated or revoked sooner. Performed at [facility], [address],  [place], [place] [address]    Dg Chest Port 1 View  Result Date: 08/03/2018 CLINICAL DATA:  Progressive shortness of breath since yesterday. Generalized weakness and dizziness. Lung cancer with active chemotherapy. EXAM: PORTABLE CHEST 1 VIEW COMPARISON:  Most recent radiograph 07/23/2018. Most recent CT 06/25/2018 FINDINGS: Right chest port with tip in the mid SVC. Left PleurX catheter in place. Volume loss in the left hemithorax with minimal blunting of left costophrenic angle, similar to prior. Diffuse increase in interstitial and patchy opacity throughout both lungs since prior exam. Unchanged heart size and mediastinal contours with left hilar fullness. Underlying emphysema. No evidence of large right pleural effusion. No visualized pneumothorax. IMPRESSION: 1. Diffuse increase in interstitial and patchy opacity throughout both lungs since prior exam. This may be represent pulmonary edema versus multifocal pneumonia, including atypical viral infection. 2. Left lung cancer. Chronic volume loss in the left hemithorax. Left PleurX catheter in place, unchanged blunting of left costophrenic angle. Electronically Signed   By: Keith Rake M.D.   On: 08/03/2018 19:05    Pending Labs Unresulted Labs (From admission, onward)    Start     Ordered   08/03/18 1936  Culture, blood (Routine X 2) w Reflex to ID Panel  BLOOD CULTURE X 2,   R (with STAT occurrences)     08/03/18 1936   08/03/18 1808  Troponin I (High Sensitivity)  STAT Now then every 2 hours,   R (with STAT occurrences)    Question Answer Comment  Indication Other   Specify indication sob/cp      08/03/18 1807          Vitals/Pain Today's Vitals   08/03/18 1809 08/03/18 1810 08/03/18 1830 08/03/18 2000  BP:  118/73 110/68 112/60  Pulse: 84 84 82 83  Resp: (!) 24 (!) 23 (!) 26 (!) 28  Temp:      TempSrc:      SpO2: 95% 96% 94% 90%  Weight:      Height:      PainSc:        Isolation Precautions No active  isolations  Medications Medications  azithromycin (ZITHROMAX) 500 mg in sodium chloride 0.9 % 250 mL IVPB (500 mg Intravenous New Bag/Given 08/03/18 2022)  albuterol (VENTOLIN HFA) 108 (90 Base) MCG/ACT inhaler 2 puff (2 puffs Inhalation Given 08/03/18 1853)  predniSONE (DELTASONE) tablet 60 mg (60 mg Oral Given 08/03/18 1853)  cefTRIAXone (ROCEPHIN) 2 g in sodium chloride 0.9 % 100 mL IVPB (2 g Intravenous New Bag/Given 08/03/18 2023)    Mobility walks with device Low fall risk   Focused Assessments Pulmonary Assessment Handoff:  Lung sounds: Bilateral Breath Sounds: Diminished O2 Device: Nasal Cannula O2 Flow Rate (L/min): 4 L/min      R Recommendations: See Admitting Provider Note  Report given to:   Additional Notes:  Covid neg. Has drain on left side to help drain lung from prior pneumonias

## 2018-08-03 NOTE — ED Notes (Signed)
DR kim in room

## 2018-08-03 NOTE — ED Notes (Addendum)
Hx of lung cancer. Currently taking chemo treatmentsPt c/o increased sob since yesterday. Seen dr and home health RN today and told him to his oxygen was low and to put his oxygen back on. Pt states he had stepped out side for a cigarette.  C/o gen weakness and dizziness with pain to mid chest that is sore and radiating into right shoulder since 4pm. Took ntg at 4pm and states it helped the shoulder pain. A/o. Pt placed on 02 4L upon entrance to room. States is "a little" dizzy now.

## 2018-08-03 NOTE — ED Provider Notes (Addendum)
[name] Provider Note MRN:  [id number]  Arrival date & time: 08/04/18     Chief Complaint   Shortness of Breath   History of Present Illness   [name] is a 78 y.o. year-old male with a history of CAD, COPD, lung cancer presenting to the ED with chief complaint of shortness of breath.  Shortness of breath for the past 1 to 2 days, gradual onset, progressively worsening.  Has been having to use oxygen throughout the day, normally only uses 3 L of oxygen at night when he is in bed.  Feeling very short of breath with ambulating associated with dizziness, some sharp central chest pain, also experiencing some increased productive cough for the past few days.  Denies fever, no abdominal pain, no numbness or weakness to the arms or legs.  Explains that he has a catheter in his chest on the left side and he was recently drained.  Review of Systems  A complete 10 system review of systems was obtained and all systems are negative except as noted in the HPI and PMH.   Patient's Health History    Past Medical History:  Diagnosis Date  . Cancer ([place])    lung  . Chronic lower back pain   . Chronic pain    pain management  . Contact with stonefish as cause of accidental injury 1954   "stung across my left hand in [place]; LUE weaker since"  . COPD (chronic obstructive pulmonary disease) ([place])   . Coronary artery disease   . Dyspnea    with exertion  . High cholesterol   . History of blood transfusion 1943  . History of kidney stones    passed  . Hypertension   . Mass of left lung   . Migraine    "none since the 1990s" (09/11/2016)  . Myocardial infarction ([place]) 01/20/1993; 02/28/1993  . Neuromuscular disorder ([facility])    neuropathy left  . On home O2   . Pneumonia    "several times" (09/11/2016)  . Tobacco abuse    ongoing    Past Surgical History:  Procedure Laterality Date  . APPENDECTOMY    . CARDIAC CATHETERIZATION    . CATARACT  EXTRACTION, BILATERAL Bilateral   . CHEST TUBE INSERTION Left 06/28/2018   Procedure: INSERTION PLEURAL DRAINAGE CATHETER;  Surgeon: Ivin Poot, MD;  Location: [facility];  Service: Thoracic;  Laterality: Left;  . COLONOSCOPY    . HIP ARTHROPLASTY Right 12/20/2016   Procedure: ARTHROPLASTY BIPOLAR HIP (HEMIARTHROPLASTY);  Surgeon: [name], MD;  Location: [facility];  Service: Orthopedics;  Laterality: Right;  . IR THORACENTESIS ASP PLEURAL SPACE W/IMG GUIDE  06/15/2018  . JOINT REPLACEMENT    . LEFT HEART CATH AND CORONARY ANGIOGRAPHY N/A 09/12/2016   Procedure: LEFT HEART CATH AND CORONARY ANGIOGRAPHY;  Surgeon: Burnell Blanks, MD;  Location: [facility];  Service: Cardiovascular;  Laterality: N/A;  . NASAL ENDOSCOPY WITH EPISTAXIS CONTROL Right 03/15/2016   Procedure: NASAL ENDOSCOPY WITH EPISTAXIS CONTROL;  Surgeon: Jodi Marble, MD;  Location: [facility];  Service: ENT;  Laterality: Right;  . PORTACATH PLACEMENT Right 04/23/2018   Procedure: INSERTION PORT-A-CATH;  Surgeon: Aviva Signs, MD;  Location: [facility];  Service: General;  Laterality: Right;  . SEPTOPLASTY Right 03/15/2016   Procedure: SEPTOPLASTY;  Surgeon: Jodi Marble, MD;  Location: [facility];  Service: ENT;  Laterality: Right;  . SINUS EXPLORATION Right 03/15/2016   Procedure: SINUS EXPLORATION;  Surgeon:  Jodi Marble, MD;  Location: [facility];  Service: ENT;  Laterality: Right;  . TONSILLECTOMY    . TOOTH EXTRACTION    . TOTAL KNEE ARTHROPLASTY Left   . VIDEO BRONCHOSCOPY WITH ENDOBRONCHIAL ULTRASOUND N/A 12/15/2017   Procedure: VIDEO BRONCHOSCOPY WITH ENDOBRONCHIAL ULTRASOUND;  Surgeon: Rigoberto Noel, MD;  Location: [facility];  Service: Thoracic;  Laterality: N/A;    Family History  Adopted: Yes  Problem Relation Age of Onset  . Cancer Mother   . Obesity Father     Social History   Socioeconomic History  . Marital status: Widowed    Spouse name: Not on file  . Number of children: Not on file  . Years of education:  Not on file  . Highest education level: Not on file  Occupational History  . Not on file  Social Needs  . Financial resource strain: Patient refused  . Food insecurity    Worry: Patient refused    Inability: Patient refused  . Transportation needs    Medical: Patient refused    Non-medical: Patient refused  Tobacco Use  . Smoking status: Current Every Day Smoker    Packs/day: 1.00    Years: 70.00    Pack years: 70.00    Types: Cigarettes  . Smokeless tobacco: Never Used  . Tobacco comment: smokes 8 a day  Substance and Sexual Activity  . Alcohol use: No    Comment: 11/26/2017  "drank from the age of 8 til 03/05/1993"  . Drug use: Not Currently    Comment: 11/26/2017 "used some drugs when I was young"  . Sexual activity: Not Currently  Lifestyle  . Physical activity    Days per week: Patient refused    Minutes per session: Patient refused  . Stress: To some extent  Relationships  . Social Herbalist on phone: Patient refused    Gets together: Patient refused    Attends religious service: Patient refused    Active member of club or organization: Patient refused    Attends meetings of clubs or organizations: Patient refused    Relationship status: Patient refused  . Intimate partner violence    Fear of current or ex partner: Patient refused    Emotionally abused: Patient refused    Physically abused: Patient refused    Forced sexual activity: Patient refused  Other Topics Concern  . Not on file  Social History Narrative  . Not on file     Physical Exam  Vital Signs and Nursing Notes reviewed Vitals:   08/03/18 2130 08/03/18 2212  BP: 117/63 136/79  Pulse: 86 84  Resp: (!) 29 16  Temp:  97.6 F (36.4 C)  SpO2: 90% 93%    CONSTITUTIONAL: Chronically ill-appearing, NAD NEURO:  Alert and oriented x 3, no focal deficits EYES:  eyes equal and reactive ENT/NECK:  no LAD, no JVD CARDIO: Regular rate, well-perfused, normal S1 and S2 PULM: Faint crackles  in bilateral bases mildly tachypneic,, catheter in place with clean dressing on the left lateral chest GI/GU:  normal bowel sounds, non-distended, non-tender MSK/SPINE:  No gross deformities, no edema SKIN:  no rash, atraumatic PSYCH:  Appropriate speech and behavior  Diagnostic and Interventional Summary    EKG Interpretation  Date/Time:  Tuesday August 03 2018 18:02:08 EDT Ventricular Rate:  87 PR Interval:    QRS Duration: 93 QT Interval:  370 QTC Calculation: 446 R Axis:   17 Text Interpretation:  Sinus rhythm Confirmed by Gerlene Fee [phone])  on 08/03/2018 6:28:16 PM      Labs Reviewed  CBC WITH DIFFERENTIAL/PLATELET - Abnormal; Notable for the following components:      Result Value   WBC 13.6 (*)    RDW 17.9 (*)    Platelets 585 (*)    Neutro Abs 8.3 (*)    Monocytes Absolute 1.3 (*)    Eosinophils Absolute 0.6 (*)    Abs Immature Granulocytes 0.11 (*)    All other components within normal limits  COMPREHENSIVE METABOLIC PANEL - Abnormal; Notable for the following components:   Glucose, Bld 146 (*)    Calcium 8.6 (*)    Total Protein 9.2 (*)    Albumin 3.1 (*)    All other components within normal limits  SARS CORONAVIRUS 2 (HOSPITAL ORDER, [facility])  CULTURE, BLOOD (ROUTINE X 2)  CULTURE, BLOOD (ROUTINE X 2)  MRSA PCR SCREENING  TROPONIN I (HIGH SENSITIVITY)  TROPONIN I (HIGH SENSITIVITY)  BRAIN NATRIURETIC PEPTIDE  HIV ANTIBODY (ROUTINE TESTING W REFLEX)  CBC  LEGIONELLA PNEUMOPHILA SEROGP 1 UR AG  STREP PNEUMONIAE URINARY ANTIGEN    DG Chest [place] 1 View  Final Result      Medications  azithromycin (ZITHROMAX) 500 mg in sodium chloride 0.9 % 250 mL IVPB (500 mg Intravenous New Bag/Given 08/03/18 2022)  enoxaparin (LOVENOX) injection 40 mg (40 mg Subcutaneous Given 08/03/18 2258)  0.9 %  sodium chloride infusion ( Intravenous New Bag/Given 08/03/18 2254)  Vancomycin (VANCOCIN) 1,500 mg in sodium chloride 0.9 % 500 mL IVPB (1,500 mg  Intravenous New Bag/Given 08/04/18 0019)  ceFEPIme (MAXIPIME) 2 g in sodium chloride 0.9 % 100 mL IVPB (2 g Intravenous New Bag/Given 08/03/18 2256)  vancomycin (VANCOCIN) IVPB 750 mg/150 ml premix (has no administration in time range)  aspirin EC tablet 81 mg (has no administration in time range)  amLODipine (NORVASC) tablet 5 mg (has no administration in time range)  lisinopril (ZESTRIL) tablet 20 mg (has no administration in time range)  metoprolol succinate (TOPROL-XL) 24 hr tablet 25 mg (has no administration in time range)  simvastatin (ZOCOR) tablet 10 mg (has no administration in time range)  metoCLOPramide (REGLAN) tablet 5 mg (has no administration in time range)  polyethylene glycol (MIRALAX / GLYCOLAX) packet 17 g (has no administration in time range)  pantoprazole (PROTONIX) EC tablet 40 mg (has no administration in time range)  senna (SENOKOT) tablet 8.6 mg (has no administration in time range)  mirabegron ER (MYRBETRIQ) tablet 50 mg (has no administration in time range)  Iron TABS 325 mg (has no administration in time range)  gabapentin (NEURONTIN) capsule 300 mg (has no administration in time range)  cyclobenzaprine (FLEXERIL) tablet 5 mg (has no administration in time range)  fluticasone (FLONASE) 50 MCG/ACT nasal spray 2 spray (has no administration in time range)  ipratropium-albuterol (DUONEB) 0.5-2.5 (3) MG/3ML nebulizer solution 3 mL (3 mLs Nebulization Not Given 08/03/18 2000)  montelukast (SINGULAIR) tablet 10 mg (has no administration in time range)  albuterol (VENTOLIN HFA) 108 (90 Base) MCG/ACT inhaler 2 puff (has no administration in time range)  oxyCODONE (Oxy IR/ROXICODONE) immediate release tablet 10 mg (has no administration in time range)  albuterol (VENTOLIN HFA) 108 (90 Base) MCG/ACT inhaler 2 puff (2 puffs Inhalation Given 08/03/18 1853)  predniSONE (DELTASONE) tablet 60 mg (60 mg Oral Given 08/03/18 1853)  cefTRIAXone (ROCEPHIN) 2 g in sodium chloride 0.9 % 100 mL  IVPB (0 g Intravenous Stopped 08/03/18 2111)  sodium chloride  0.9 % with vancomycin (VANCOCIN) ADS Med (has no administration in time range)     Procedures Critical Care Critical Care Documentation Critical care time provided by me (excluding procedures): 33 minutes  Condition necessitating critical care: hypoxic respiratory failure due to pneumonia  Components of critical care management: reviewing of prior records, laboratory and imaging interpretation, frequent re-examination and reassessment of vital signs, administration of IV fluids, IV antibiotics, discussion with consulting services    ED Course and Medical Decision Making  I have reviewed the triage vital signs and the nursing notes.  Pertinent labs & imaging results that were available during my care of the patient were reviewed by me and considered in my medical decision making (see below for details).  Concern for COPD exacerbation versus pleural effusion related to patient's malignancy, less concerning for ACS given the atypical presentation, EKG is without ischemic features.  Patient is currently on 3 L nasal cannula satting 91%, as stated above this is an increased oxygen requirement given that he normally only needs oxygen at night.  Anticipating admission for COPD exacerbation.  Clinical Course as of Aug 04 35  Tue Aug 03, 2018  1845 No evidence of DVT on exam, no tachycardia, chest pain was brief and sharp pain is currently not present, low concern for pulmonary embolism.   [MB]    Clinical Course User Index [MB] Maudie Flakes, MD    Chest x-ray concerning for pneumonia, coronavirus testing is negative.  Admitted to hospital service for further care.  Barth Kirks. [place] Small, [place] [email or web address]  Final Clinical Impressions(s) / ED Diagnoses     ICD-10-CM   1. Pneumonia due to infectious organism, unspecified laterality, unspecified part of lung  J18.9    2. SOB (shortness of breath)  R06.02 DG Chest [name] 1 View    DG Chest [place] 1 View    ED Discharge Orders    None         Maudie Flakes, MD 08/04/18 0037    Maudie Flakes, MD 08/18/18 0730

## 2018-08-03 NOTE — Progress Notes (Signed)
Pharmacy Antibiotic Note  [name] is a 78 y.o. male admitted on 08/03/2018 with pneumonia.  Pharmacy has been consulted for vancomycin and cefepime dosing.  Plan: Vancomycin 750mg  IV every 12 hours.  Goal trough 15-20 mcg/mL. cefepime 2gm iv q12h  Height: 6' (182.9 cm) Weight: 155 lb (70.3 kg) IBW/kg (Calculated) : 77.6  Temp (24hrs), Avg:98.4 F (36.9 C), Min:98.4 F (36.9 C), Max:98.4 F (36.9 C)  Recent Labs  Lab 08/03/18 1807  WBC 13.6*  CREATININE 1.07    Estimated Creatinine Clearance: 57.5 mL/min (by C-G formula based on SCr of 1.07 mg/dL).    Allergies  Allergen Reactions  . Aspirin     Regular asa     Antimicrobials this admission: 7/7 cefepime >>  7/7 vancomycin >>    Microbiology results: 7/7 BCx: sent 7/7 MRSA PCR: sent   Thank you for allowing pharmacy to be a part of this patient's care.  Donna Christen Vora Clover 08/03/2018 9:30 PM

## 2018-08-04 DIAGNOSIS — J9601 Acute respiratory failure with hypoxia: Secondary | ICD-10-CM

## 2018-08-04 LAB — CBC
HCT: 38.2 % — ABNORMAL LOW (ref 39.0–52.0)
Hemoglobin: 12.1 g/dL — ABNORMAL LOW (ref 13.0–17.0)
MCH: 28.7 pg (ref 26.0–34.0)
MCHC: 31.7 g/dL (ref 30.0–36.0)
MCV: 90.5 fL (ref 80.0–100.0)
Platelets: 496 10*3/uL — ABNORMAL HIGH (ref 150–400)
RBC: 4.22 MIL/uL (ref 4.22–5.81)
RDW: 17.6 % — ABNORMAL HIGH (ref 11.5–15.5)
WBC: 8 10*3/uL (ref 4.0–10.5)
nRBC: 0 % (ref 0.0–0.2)

## 2018-08-04 LAB — MRSA PCR SCREENING: MRSA by PCR: NEGATIVE

## 2018-08-04 MED ORDER — ORAL CARE MOUTH RINSE
15.0000 mL | Freq: Two times a day (BID) | OROMUCOSAL | Status: DC
  Administered 2018-08-04 (×2): 15 mL via OROMUCOSAL

## 2018-08-04 MED ORDER — CHLORHEXIDINE GLUCONATE 0.12 % MT SOLN
15.0000 mL | Freq: Two times a day (BID) | OROMUCOSAL | Status: DC
  Administered 2018-08-04 (×2): 15 mL via OROMUCOSAL
  Filled 2018-08-04 (×3): qty 15

## 2018-08-04 NOTE — Progress Notes (Addendum)
Patient Demographics:    Bradley [name], is a 78 y.o. male, DOB - 1940-12-19, LHT:[id number]  Admit date - 08/03/2018   Admitting Physician Jani Gravel, MD  Outpatient Primary MD for the patient is Jani Gravel, MD  LOS - 1   Chief Complaint  Patient presents with  . Shortness of Breath        Subjective:    [name] today has no fevers, no emesis,  No chest pain, but has cough, has shortness of breath with increased oxygen requirement   Assessment  & Plan :    Principal Problem:   Acute respiratory failure with hypoxia (HCC) Active Problems:   Tobacco abuse   COPD (chronic obstructive pulmonary disease) (HCC)   CAD (coronary artery disease), native coronary artery   Essential hypertension   HCAP (healthcare-associated pneumonia)   Hyperglycemia   Acute on chronic respiratory failure with hypoxia (HCC)  Brief Summary:- 78 y.o. 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  COVID-19 negative, leukocytosis  3)h/o CAD/HTN--- stable, continue metoprolol 25 mg daily, lisinopril 20 mg daily Deplin 5 mg daily  4)/chronic back pain MSK--pain--- stable, continue Flexeril and gabapentin  5) metastatic Lt lung cancer--with liver  mass and recurrent pleural effusion status post Pleurx catheter placement on 06/28/2018, Patient has had very scant drainage from the catheter plan was to remove catheter sometime later this month  Disposition/Need for in-Hospital Stay- patient unable to be discharged at this time due to increasing cough, shortness of breath and increased oxygen requirement due to H CAP requiring IV antibiotics  Code Status : DNR  Family Communication:   NA (patient is alert, awake and coherent)   Disposition Plan  : Possibly home couple of days if O2 requirement and respiratory symptoms improved  Consults  :  na  DVT Prophylaxis  :  Lovenox -  - SCDs   Lab Results  Component Value Date   PLT 496 (H) 08/04/2018    Inpatient Medications  Scheduled Meds: . amLODipine  5 mg Oral Daily  . aspirin EC  81 mg Oral Daily  . chlorhexidine  15 mL Mouth Rinse BID  . cyclobenzaprine  5 mg Oral TID  . enoxaparin (LOVENOX) injection  40 mg Subcutaneous Q24H  . ferrous sulfate  325 mg Oral Daily  . fluticasone  2 spray Each Nare Daily  . gabapentin  300 mg Oral TID  . ipratropium-albuterol  3 mL Nebulization TID  . lisinopril  20 mg Oral Daily  . mouth rinse  15 mL Mouth Rinse q12n4p  . metoCLOPramide  5 mg Oral TID AC & HS  .  metoprolol succinate  25 mg Oral Daily  . mirabegron ER  50 mg Oral Daily  . montelukast  10 mg Oral Daily  . pantoprazole  40 mg Oral Daily  . senna  1 tablet Oral Daily  . simvastatin  10 mg Oral QHS   Continuous Infusions: . azithromycin 500 mg (08/03/18 2022)  . ceFEPime (MAXIPIME) IV 2 g (08/04/18 1017)   PRN Meds:.albuterol, oxyCODONE, polyethylene glycol    Anti-infectives (From admission, onward)   Start     Dose/Rate Route Frequency Ordered Stop   08/04/18 0900  vancomycin (VANCOCIN) IVPB 750 mg/150 ml premix  Status:  Discontinued     750 mg 150 mL/hr over 60 Minutes Intravenous Every 12 hours 08/03/18 2130 08/04/18 1646   08/03/18 2200  ceFEPIme (MAXIPIME) 2 g  in sodium chloride 0.9 % 100 mL IVPB     2 g 200 mL/hr over 30 Minutes Intravenous Every 12 hours 08/03/18 2130     08/03/18 2145  Vancomycin (VANCOCIN) 1,500 mg in sodium chloride 0.9 % 500 mL IVPB     1,500 mg 250 mL/hr over 120 Minutes Intravenous  Once 08/03/18 2130 08/04/18 0219   08/03/18 1945  cefTRIAXone (ROCEPHIN) 2 g in sodium chloride 0.9 % 100 mL IVPB     2 g 200 mL/hr over 30 Minutes Intravenous  Once 08/03/18 1936 08/03/18 2111   08/03/18 1945  azithromycin (ZITHROMAX) 500 mg in sodium chloride 0.9 % 250 mL IVPB     500 mg 250 mL/hr over 60 Minutes Intravenous Every 24 hours 08/03/18 1936          Objective:   Vitals:   08/04/18 0446 08/04/18 0736 08/04/18 1309 08/04/18 1402  BP: 131/69  (!) 124/55   Pulse: 94  81   Resp: (!) 22  16   Temp: 97.7 F (36.5 C)  98.4 F (36.9 C)   TempSrc: Oral  Oral   SpO2: 95% 93% 97% 92%  Weight:      Height:        Wt Readings from Last 3 Encounters:  08/03/18 69.8 kg  07/26/18 70.1 kg  07/26/18 70.8 kg     Intake/Output Summary (Last 24 hours) at 08/04/2018 1648 Last data filed at 08/04/2018 1300 Gross per 24 hour  Intake 1643.98 ml  Output 1100 ml  Net 543.98 ml     Physical Exam  Gen:- Awake Alert, speaking in short sentences HEENT:- [place].AT, No sclera icterus Nose-  4 to 5 L/min  Neck-Supple Neck,No JVD,.  Lungs-diminished breath sounds left more than right, scattered rhonchi .  Left-sided Pleurx catheter in situ CV- S1, S2 normal, regular  Abd-  +ve B.Sounds, Abd Soft, No tenderness,    Extremity/Skin:- No  edema, pedal pulses present Psych-affect is appropriate, oriented x3 Neuro-no new focal deficits, no tremors   Data Review:   Micro Results Recent Results (from the past 240 hour(s))  SARS Coronavirus 2 (CEPHEID - Performed in [place] hospital lab), Hosp Order     Status: None   Collection Time: 08/03/18  7:41 PM   Specimen: Nasopharyngeal Swab  Result Value Ref Range Status   SARS Coronavirus  2 NEGATIVE NEGATIVE Final    Comment: (NOTE) If result is NEGATIVE SARS-CoV-2 target nucleic acids are NOT DETECTED. The SARS-CoV-2 RNA is generally detectable in upper and lower  respiratory specimens during the acute phase of infection. The lowest  concentration of SARS-CoV-2 viral copies this assay can detect is 250  copies / mL.  A negative result does not preclude SARS-CoV-2 infection  and should not be used as the sole basis for treatment or other  patient management decisions.  A negative result may occur with  improper specimen collection / handling, submission of specimen other  than nasopharyngeal swab, presence of viral mutation(s) within the  areas targeted by this assay, and inadequate number of viral copies  (<250 copies / mL). A negative result must be combined with clinical  observations, patient history, and epidemiological information. If result is POSITIVE SARS-CoV-2 target nucleic acids are DETECTED. The SARS-CoV-2 RNA is generally detectable in upper and lower  respiratory specimens dur ing the acute phase of infection.  Positive  results are indicative of active infection with SARS-CoV-2.  Clinical  correlation with patient history and other diagnostic information is  necessary to determine patient infection status.  Positive results do  not rule out bacterial infection or co-infection with other viruses. If result is PRESUMPTIVE POSTIVE SARS-CoV-2 nucleic acids MAY BE PRESENT.   A presumptive positive result was obtained on the submitted specimen  and confirmed on repeat testing.  While 2019 novel coronavirus  (SARS-CoV-2) nucleic acids may be present in the submitted sample  additional confirmatory testing may be necessary for epidemiological  and / or clinical management purposes  to differentiate between  SARS-CoV-2 and other Sarbecovirus currently known to infect humans.  If clinically indicated additional testing with an alternate test  methodology  [phone]) is advised. The SARS-CoV-2 RNA is generally  detectable in upper and lower respiratory sp ecimens during the acute  phase of infection. The expected result is Negative. Fact Sheet for Patients:  StrictlyIdeas.no Fact Sheet for Healthcare Providers: BankingDealers.co.za This test is not yet approved or cleared by the [place] FDA and has been authorized for detection and/or diagnosis of SARS-CoV-2 by FDA under an Emergency Use Authorization (EUA).  This EUA will remain in effect (meaning this test can be used) for the duration of the COVID-19 declaration under Section 564(b)(1) of the Act, 21 U.S.C. section 360bbb-3(b)(1), unless the authorization is terminated or revoked sooner. Performed at [facility], [address], [place], [place] [address]   Culture, blood (Routine X 2) w Reflex to ID Panel     Status: None (Preliminary result)   Collection Time: 08/03/18  7:58 PM   Specimen: BLOOD  Result Value Ref Range Status   Specimen Description BLOOD RIGHT ANTECUBITAL  Final   Special Requests   Final    BOTTLES DRAWN AEROBIC AND ANAEROBIC Blood Culture adequate volume   Culture   Final    NO GROWTH < 12 HOURS Performed at [facility], [address], [place], [place] [address]    Report Status PENDING  Incomplete  Culture, blood (Routine X 2) w Reflex to ID Panel     Status: None (Preliminary result)   Collection Time: 08/03/18  8:02 PM   Specimen: BLOOD RIGHT HAND  Result Value Ref Range Status   Specimen Description BLOOD RIGHT HAND  Final   Special Requests   Final    BOTTLES DRAWN AEROBIC AND ANAEROBIC Blood Culture adequate volume   Culture   Final    NO GROWTH < 12 HOURS Performed at [facility], [address] [place], [place] [address]    Report Status PENDING  Incomplete  MRSA PCR Screening     Status: None   Collection Time: 08/03/18  9:13 PM   Specimen: Nasal Mucosa; Nasopharyngeal  Result Value Ref  Range Status  MRSA by PCR NEGATIVE NEGATIVE Final    Comment:        The GeneXpert MRSA Assay (FDA approved for NASAL specimens only), is one component of a comprehensive MRSA colonization surveillance program. It is not intended to diagnose MRSA infection nor to guide or monitor treatment for MRSA infections. Performed at [facility], [address], [place], [place] [address]     Radiology Reports Dg Chest 2 View  Result Date: 07/23/2018 CLINICAL DATA:  Cough and shortness of breath. History of lung carcinoma EXAM: CHEST - 2 VIEW COMPARISON:  June 29, 2018 FINDINGS: There is fibrosis throughout the lungs bilaterally. There is less consolidation in the left upper lobe compared to most recent chest radiograph. There is asymmetric pleural thickening on the left laterally and superiorly, stable. No new opacity evident. Heart size is normal. Pulmonary vascularity on the right is normal. Distortion of pulmonary vascularity on the left is due to the fibrosis. No adenopathy is appreciable by radiography. Port-A-Cath tip is in the superior vena cava. No pneumothorax. There is aortic atherosclerosis. No bone lesions. IMPRESSION: Areas of fibrosis with less opacity in the left upper lobe compared to most recent study. No new opacity evident. Stable cardiac silhouette. Port-A-Cath tip in superior vena cava. Aortic Atherosclerosis (ICD10-I70.0). Electronically Signed   By: Lowella Grip III M.D.   On: 07/23/2018 11:35   Dg Chest Port 1 View  Result Date: 08/03/2018 CLINICAL DATA:  Progressive shortness of breath since yesterday. Generalized weakness and dizziness. Lung cancer with active chemotherapy. EXAM: PORTABLE CHEST 1 VIEW COMPARISON:  Most recent radiograph 07/23/2018. Most recent CT 06/25/2018 FINDINGS: Right chest port with tip in the mid SVC. Left PleurX catheter in place. Volume loss in the left hemithorax with minimal blunting of left costophrenic angle, similar to prior. Diffuse increase  in interstitial and patchy opacity throughout both lungs since prior exam. Unchanged heart size and mediastinal contours with left hilar fullness. Underlying emphysema. No evidence of large right pleural effusion. No visualized pneumothorax. IMPRESSION: 1. Diffuse increase in interstitial and patchy opacity throughout both lungs since prior exam. This may be represent pulmonary edema versus multifocal pneumonia, including atypical viral infection. 2. Left lung cancer. Chronic volume loss in the left hemithorax. Left PleurX catheter in place, unchanged blunting of left costophrenic angle. Electronically Signed   By: Keith Rake M.D.   On: 08/03/2018 19:05     CBC Recent Labs  Lab 08/03/18 1807 08/04/18 0528  WBC 13.6* 8.0  HGB 14.4 12.1*  HCT 46.2 38.2*  PLT 585* 496*  MCV 90.6 90.5  MCH 28.2 28.7  MCHC 31.2 31.7  RDW 17.9* 17.6*  LYMPHSABS 3.2  --   MONOABS 1.3*  --   EOSABS 0.6*  --   BASOSABS 0.1  --     Chemistries  Recent Labs  Lab 08/03/18 1807  NA 137  K 3.7  CL 101  CO2 24  GLUCOSE 146*  BUN 22  CREATININE 1.07  CALCIUM 8.6*  AST 17  ALT 11  ALKPHOS 39  BILITOT 0.8   ------------------------------------------------------------------------------------------------------------------ No results for input(s): CHOL, HDL, LDLCALC, TRIG, CHOLHDL, LDLDIRECT in the last 72 hours.  Lab Results  Component Value Date   HGBA1C 5.4 09/11/2016   ------------------------------------------------------------------------------------------------------------------ No results for input(s): TSH, T4TOTAL, T3FREE, THYROIDAB in the last 72 hours.  Invalid input(s): FREET3 ------------------------------------------------------------------------------------------------------------------ No results for input(s): VITAMINB12, FOLATE, FERRITIN, TIBC, IRON, RETICCTPCT in the last 72 hours.  Coagulation profile No results for input(s): INR, PROTIME  in the last 168 hours.  No results  for input(s): DDIMER in the last 72 hours.  Cardiac Enzymes No results for input(s): CKMB, TROPONINI, MYOGLOBIN in the last 168 hours.  Invalid input(s): CK ------------------------------------------------------------------------------------------------------------------    Component Value Date/Time   BNP 77.0 08/03/2018 1831     Furkan Keenum M.D on 08/04/2018 at 4:48 PM  Go to [email or web address] - for contact info  [facility] - Office  [phone]

## 2018-08-04 NOTE — TOC Initial Note (Signed)
Transition of Care [facility]) - Initial/Assessment Note    Patient Details  Name: Bradley [name] MRN: [id number] Date of Birth: [birth year]  Transition of Care [facility]) CM/SW Contact:    Kinsler Soeder, Chauncey Reading, RN Phone Number: 08/04/2018, 1:01 PM  Clinical Narrative:       HCAP. From home. Active with [facility] for RN services. Has pluer-x drain, daughters drains every Monday. Patient has appt. To follow up with MD on 7/29 to decide if drain will remain. Has oxygen at home pta. Independent. Has RW. Will resume home health at time of DC.             Expected Discharge Plan: [place] Barriers to Discharge: No Barriers Identified   Patient Goals and CMS Choice Patient states their goals for this hospitalization and ongoing recovery are:: breathe easier and get home      Expected Discharge Plan and Services Expected Discharge Plan: [place]     Prior Living Arrangements/Services     Patient language and need for interpreter reviewed:: Yes Do you feel safe going back to the place where you live?: Yes      Need for Family Participation in Patient Care: Yes (Comment) Care giver support system in place?: Yes (comment) Current home services: Home RN Criminal Activity/Legal Involvement Pertinent to Current Situation/Hospitalization: No - Comment as needed  Activities of Daily Living Home Assistive Devices/Equipment: Environmental consultant (specify type), Cane (specify quad or straight), Oxygen, Dentures (specify type), Hearing aid, Bedside commode/3-in-1 ADL Screening (condition at time of admission) Patient's cognitive ability adequate to safely complete daily activities?: Yes Is the patient deaf or have difficulty hearing?: Yes Does the patient have difficulty seeing, even when wearing glasses/contacts?: No Does the patient have difficulty concentrating, remembering, or making decisions?: Yes Patient able to express need for assistance with ADLs?: Yes Does  the patient have difficulty dressing or bathing?: Yes Independently performs ADLs?: No Communication: Independent Dressing (OT): Needs assistance Is this a change from baseline?: Pre-admission baseline Grooming: Needs assistance Is this a change from baseline?: Pre-admission baseline Feeding: Independent Bathing: Needs assistance Is this a change from baseline?: Pre-admission baseline Toileting: Independent In/Out Bed: Independent Walks in Home: Independent Does the patient have difficulty walking or climbing stairs?: Yes Weakness of Legs: Both Weakness of Arms/Hands: Left  Permission Sought/Granted   Permission granted to share information with : Yes, Verbal Permission Granted     Permission granted to share info w AGENCY: [facility], [facility]        Emotional Assessment Appearance:: Appears stated age Attitude/Demeanor/Rapport: Engaged Affect (typically observed): Accepting Orientation: : Oriented to Self, Oriented to Place, Oriented to  Time, Oriented to Situation Alcohol / Substance Use: Not Applicable Psych Involvement: No (comment)  Admission diagnosis:  SOB (shortness of breath) [R06.02] Acute on chronic respiratory failure with hypoxia (HCC) [J96.21] Patient Active Problem List   Diagnosis Date Noted  . Acute respiratory failure with hypoxia ([place]) 08/03/2018  . Hyperglycemia 08/03/2018  . Acute on chronic respiratory failure with hypoxia ([place]) 08/03/2018  . Recurrent left pleural effusion 06/25/2018  . Postobstructive pneumonia 06/25/2018  . On home O2 06/25/2018  . Acute on chronic respiratory failure ([place]) 06/25/2018  . Metastatic cancer ([place])   . Palliative care encounter   . Encounter for hospice care discussion   . HCAP (healthcare-associated pneumonia) 06/13/2018  . Acute maxillary sinusitis 12/30/2017  . Palliative care by specialist   . Goals of care, counseling/discussion   .  Malignant neoplasm of left lung ([place])   . Left leg weakness   .  Nosocomial pneumonia 12/29/2017  . 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 Mass of lower lobe of left lung 12/10/2017  . Chest pain in adult   . Syncope and collapse   . Rt Hip fracture ([place]) 12/19/2016  . Chest pain 09/12/2016  . Leukocytosis 09/12/2016  . Abnormal chest x-ray 09/12/2016  . Hyperlipidemia 09/12/2016  . Hypokalemia 09/12/2016  . Precordial pain   . Tobacco abuse 09/11/2016  . COPD (chronic obstructive pulmonary disease) ([place]) 09/11/2016  . CAD (coronary artery disease), native coronary artery 09/11/2016  . Chronic pain syndrome 09/11/2016  . Essential hypertension 09/11/2016  . Right-sided epistaxis 03/15/2016   PCP:  Jani Gravel, MD Pharmacy:   [id number] - [place], [place] [address] Phone: [phone] Fax: [phone]     Social Determinants of Health (SDOH) Interventions    Readmission Risk Interventions Readmission Risk Prevention Plan 08/04/2018  Transportation Screening Complete  Medication Review Press photographer) Complete  HRI or Home Care Consult Complete  SW Recovery Care/Counseling Consult Complete  Palliative Care Screening Not Applicable  Skilled Nursing Facility Complete

## 2018-08-05 ENCOUNTER — Inpatient Hospital Stay (HOSPITAL_COMMUNITY): Payer: Medicare Other

## 2018-08-05 DIAGNOSIS — J9621 Acute and chronic respiratory failure with hypoxia: Principal | ICD-10-CM

## 2018-08-05 LAB — HIV ANTIBODY (ROUTINE TESTING W REFLEX): HIV Screen 4th Generation wRfx: NONREACTIVE

## 2018-08-05 MED ORDER — ADVAIR HFA 115-21 MCG/ACT IN AERO: 2.0000 | INHALATION_SPRAY | Freq: Two times a day (BID) | RESPIRATORY_TRACT | 4 refills | Status: DC

## 2018-08-05 MED ORDER — FUROSEMIDE 20 MG PO TABS: 20.0000 mg | ORAL_TABLET | Freq: Every day | ORAL | 0 refills | Status: DC

## 2018-08-05 MED ORDER — DOXYCYCLINE HYCLATE 100 MG PO TABS: 100.0000 mg | ORAL_TABLET | Freq: Two times a day (BID) | ORAL | 0 refills | Status: AC

## 2018-08-05 MED ORDER — IPRATROPIUM-ALBUTEROL 0.5-2.5 (3) MG/3ML IN SOLN: 3.0000 mL | Freq: Four times a day (QID) | RESPIRATORY_TRACT | Status: AC | PRN

## 2018-08-05 MED ORDER — CEPHALEXIN 500 MG PO CAPS: 500.0000 mg | ORAL_CAPSULE | Freq: Three times a day (TID) | ORAL | 0 refills | Status: DC

## 2018-08-05 MED ORDER — VENTOLIN HFA 108 (90 BASE) MCG/ACT IN AERS: 2.0000 | INHALATION_SPRAY | RESPIRATORY_TRACT | 5 refills | Status: DC | PRN

## 2018-08-05 MED ORDER — CEPHALEXIN 500 MG PO CAPS
500.0000 mg | ORAL_CAPSULE | Freq: Three times a day (TID) | ORAL | Status: DC
  Administered 2018-08-05: 500 mg via ORAL
  Filled 2018-08-05: qty 1

## 2018-08-05 MED ORDER — FUROSEMIDE 40 MG PO TABS
40.0000 mg | ORAL_TABLET | Freq: Once | ORAL | Status: AC
  Administered 2018-08-05: 40 mg via ORAL
  Filled 2018-08-05: qty 1

## 2018-08-05 MED ORDER — PREDNISONE 20 MG PO TABS: 20.0000 mg | ORAL_TABLET | Freq: Every day | ORAL | 0 refills | Status: DC

## 2018-08-05 MED ORDER — PREDNISONE 20 MG PO TABS
50.0000 mg | ORAL_TABLET | Freq: Every day | ORAL | Status: DC
  Administered 2018-08-05: 50 mg via ORAL
  Filled 2018-08-05: qty 1

## 2018-08-05 MED ORDER — DOXYCYCLINE HYCLATE 100 MG PO TABS
100.0000 mg | ORAL_TABLET | Freq: Two times a day (BID) | ORAL | Status: DC
  Administered 2018-08-05: 100 mg via ORAL
  Filled 2018-08-05: qty 1

## 2018-08-05 NOTE — TOC Transition Note (Addendum)
Transition of Care [facility]) - CM/SW Discharge Note   Patient Details  Name: Bradley [name] MRN: [id number] Date of Birth: [birth year]  [facility] Care [facility]) CM/SW Contact:  Waldine Zenz, Chauncey Reading, RN Phone Number: 08/05/2018, 12:55 PM   Clinical Narrative:   Patient discharging home today. Daughter to transport, will bring portable oxygen tank for transport home. Vaughan Basta of [facility] notified of DC. Dr. Julianne Rice office will call to schedule follow up appointment.  Juliann Pulse of [facility] notified that patient is leaving and needs someone to come out to help educate him on oxygen concentrator and evaluate for smaller tanks.       Barriers to Discharge: No Barriers Identified   Patient Goals and CMS Choice Patient states their goals for this hospitalization and ongoing recovery are:: breathe easier and get home        Readmission Risk Interventions Readmission Risk Prevention Plan 08/04/2018  Transportation Screening Complete  Medication Review Press photographer) Complete  HRI or [place] Complete  SW Recovery Care/Counseling Consult Complete  Palliative Care Screening Not Applicable  Skilled Nursing Facility Complete

## 2018-08-05 NOTE — Progress Notes (Signed)
Pt educated on discharge instructions, pt verbalized understanding, granddaughter for transportation home with home oxygen.

## 2018-08-05 NOTE — Discharge Instructions (Signed)
1)Very low-salt diet advised 2)Weigh yourself daily, call if you gain more than 3 pounds in 1 day or more than 5 pounds in 1 week as your diuretic medications may need to be adjusted 3)Limit your Fluid  intake to no more than 60 ounces (1.8 Liters) per day 4) smoking cessation strongly advised 5)There is significant risk of fire, self injury and death if you continues to smoke with oxygen in the house 6) follow-up to primary care physician within a week for recheck and reevaluation

## 2018-08-05 NOTE — Discharge Summary (Signed)
Bradley [name], is a 78 y.o. male  DOB 05-19-1940  MRN [id number].  Admission date:  08/03/2018  Admitting Physician  Jani Gravel, MD  Discharge Date:  08/05/2018   Primary MD  Jani Gravel, MD  Recommendations for primary care physician for things to follow:   1)Very low-salt diet advised 2)Weigh yourself daily, call if you gain more than 3 pounds in 1 day or more than 5 pounds in 1 week as your diuretic medications may need to be adjusted 3)Limit your Fluid  intake to no more than 60 ounces (1.8 Liters) per day 4) smoking cessation strongly advised 5)There is significant risk of fire, self injury and death if you continues to smoke with oxygen in the house 6) follow-up to primary care physician within a week for recheck and reevaluation   Admission Diagnosis  SOB (shortness of breath) [R06.02] Acute on chronic respiratory failure with hypoxia (HCC) [J96.21]   Discharge Diagnosis  SOB (shortness of breath) [R06.02] Acute on chronic respiratory failure with hypoxia (HCC) [J96.21]    Principal Problem:   Acute on chronic respiratory failure with hypoxia ([place]) Active Problems:   HCAP (healthcare-associated pneumonia)   Tobacco abuse   COPD (chronic obstructive pulmonary disease) (HCC)   CAD (coronary artery disease), native coronary artery   Essential hypertension   Acute respiratory failure with hypoxia ([place])   Hyperglycemia      Past Medical History:  Diagnosis Date  . Cancer ([place])    lung  . Chronic lower back pain   . Chronic pain    pain management  . Contact with stonefish as cause of accidental injury 1954   "stung across my left hand in [place]; LUE weaker since"  . COPD (chronic obstructive pulmonary disease) ([place])   . Coronary artery disease   . Dyspnea    with exertion  . High cholesterol   . History of blood transfusion 1943  . History of kidney stones    passed  . Hypertension    . Mass of left lung   . Migraine    "none since the 1990s" (09/11/2016)  . Myocardial infarction ([place]) 01/20/1993; 02/28/1993  . Neuromuscular disorder (HCC)    neuropathy left  . On home O2   . Pneumonia    "several times" (09/11/2016)  . Tobacco abuse    ongoing    Past Surgical History:  Procedure Laterality Date  . APPENDECTOMY    . CARDIAC CATHETERIZATION    . CATARACT EXTRACTION, BILATERAL Bilateral   . CHEST TUBE INSERTION Left 06/28/2018   Procedure: INSERTION PLEURAL DRAINAGE CATHETER;  Surgeon: Ivin Poot, MD;  Location: [facility];  Service: Thoracic;  Laterality: Left;  . COLONOSCOPY    . HIP ARTHROPLASTY Right 12/20/2016   Procedure: ARTHROPLASTY BIPOLAR HIP (HEMIARTHROPLASTY);  Surgeon: [name], MD;  Location: [facility];  Service: Orthopedics;  Laterality: Right;  . IR THORACENTESIS ASP PLEURAL SPACE W/IMG GUIDE  06/15/2018  . JOINT REPLACEMENT    . LEFT HEART CATH AND CORONARY ANGIOGRAPHY N/A  09/12/2016   Procedure: LEFT HEART CATH AND CORONARY ANGIOGRAPHY;  Surgeon: Burnell Blanks, MD;  Location: [facility];  Service: Cardiovascular;  Laterality: N/A;  . NASAL ENDOSCOPY WITH EPISTAXIS CONTROL Right 03/15/2016   Procedure: NASAL ENDOSCOPY WITH EPISTAXIS CONTROL;  Surgeon: Jodi Marble, MD;  Location: [facility];  Service: ENT;  Laterality: Right;  . PORTACATH PLACEMENT Right 04/23/2018   Procedure: INSERTION PORT-A-CATH;  Surgeon: Aviva Signs, MD;  Location: [facility];  Service: General;  Laterality: Right;  . SEPTOPLASTY Right 03/15/2016   Procedure: SEPTOPLASTY;  Surgeon: Jodi Marble, MD;  Location: [facility];  Service: ENT;  Laterality: Right;  . SINUS EXPLORATION Right 03/15/2016   Procedure: SINUS EXPLORATION;  Surgeon: Jodi Marble, MD;  Location: [facility];  Service: ENT;  Laterality: Right;  . TONSILLECTOMY    . TOOTH EXTRACTION    . TOTAL KNEE ARTHROPLASTY Left   . VIDEO BRONCHOSCOPY WITH ENDOBRONCHIAL ULTRASOUND N/A 12/15/2017   Procedure: VIDEO  BRONCHOSCOPY WITH ENDOBRONCHIAL ULTRASOUND;  Surgeon: Rigoberto Noel, MD;  Location: [facility];  Service: Thoracic;  Laterality: N/A;       HPI  from the history and physical done on the day of admission:    Bradley [name]  is a 78 y.o. 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  Pt was seen by home health RN today and told him that his o2 sat was low and to wear his oxygen. His daughter apparently told him to go to ER for evaluation of dyspnea.  Pt denies fever, chills, cp, palp, n/v, abd pain, diarrhea, brbpr.     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 Diffuse increase in interstitial and patchy opacity throughout both lungs since prior exam. This may be represent pulmonary edema versus multifocal pneumonia, including atypical viral infection. 2. Left lung cancer. Chronic volume loss in the left hemithorax. Left PleurX catheter in place, unchanged blunting of left costophrenic angle.  Covid  19 negative  Wbc13.6, Hgb 14.4, Plt 585  Na 137, K 3.7, Bun 22, Creatinine 1.07 Calcium 8.6,  Ast 17, Alt 11  Trop 5.0 BNP 77  Blood culture x 2 pending  Pt received rocephin 2gm iv x1, and zithromax 500mg  iv x1 in ED,   Pt will be admitted for acute respiratory failure with hypoxia secondary to to [facility]     Hospital Course:        Brief Summary:- 78 y.o.male,w hypertension, hyperlipidemia, CAD (MI x2), Chronic back pain, tobacco dependence, severe Copd on home o2 3L  [place] prn , w metastatic adenocarcinoma left lung wliver metastasis, w recent admission for pneumonia/loculated left pleural effusion , s/p pleurex catheter 06/28/2018 admitted on 08/03/2018 with Presumed HCAP PNA  A/p 1) acute on chronic hypoxic respiratory failure--- and COPD now with superimposed presumed HCAP pneumonia,  oxygen requirement and respiratory status has improved,  cough and dyspnea are also improved --initially treated with vancomycin, azithromycin and cefepime,, repeat chest x-ray on 08/05/2018 with possible resolving infection, discharged on Keflex and doxycycline, low-dose prednisone for COPD flareup.... Oxygen requirement is back to patient's baseline, ambulated in hallway with oxygen and did well --- Pulmonary edema/CHF exacerbation clinically less likely  2)HCAP--- -initially treated with antibiotics as above #1, MRSA PCR is negative, continue bronchodilators and mucolytics,   COVID-19 negative, discharge home on antibiotics as above #1  3)h/o CAD/HTN--- stable, continue metoprolol 25 mg daily, lisinopril 20 mg daily and amlodipine 5 mg daily  4)/chronic back pain [facility]--pain--- stable, continue Flexeril and gabapentin  5) metastatic Lt lung cancer--with liver mass and recurrent pleural effusion status post Pleurx catheter placement on 06/28/2018, Patient has had very scant drainage from the catheter plan was to remove catheter sometime within the next few weeks   Code Status : DNR   Family Communication:    (patient is alert, awake and coherent)  Disposition Plan  : Home with home O2  Consults  :  na  Discharge Condition: stable  Follow UP  Follow-up Information    [facility] Follow up.   Contact information: [place] [phone]       [facility] Follow up.   Why: they will contact you to help you with your oxygen           Diet and Activity recommendation:  As advised  Discharge Instructions    Discharge Instructions    Call MD for:  difficulty breathing, headache or visual disturbances   Complete by: As directed    Call MD for:  persistant dizziness or light-headedness   Complete by: As directed    Call MD for:  persistant nausea and vomiting   Complete by: As directed    Call MD for:  temperature >100.4   Complete by: As directed    Diet - low sodium heart healthy   Complete by: As directed    Discharge instructions   Complete by: As directed    1)Very low-salt diet advised 2)Weigh yourself daily, call if you gain more than 3 pounds in 1 day or more than 5 pounds in 1 week as your diuretic medications may need to be adjusted 3)Limit your Fluid  intake to no more than 60 ounces (1.8 Liters) per day 4) smoking cessation strongly advised 5)There is significant risk of fire, self injury and death if you continues to smoke with oxygen in the house 6) follow-up to primary care physician within a week for recheck and reevaluation   Increase activity slowly   Complete by: As directed         Discharge Medications     Allergies as of 08/05/2018      Reactions   Aspirin    Allergy to regular Aspirin only      Medication List    TAKE these medications   Advair HFA 115-21 MCG/ACT inhaler Generic drug: fluticasone-salmeterol Inhale 2 puffs into the lungs 2 (two) times daily. What changed: when to take this   amLODipine 5 MG tablet Commonly known as: NORVASC TAKE 1  TABLET BY MOUTH  EVERY DAY   aspirin EC 81 MG tablet Take 81 mg by mouth daily.   cephALEXin 500 MG capsule Commonly known as: KEFLEX Take 1 capsule (500 mg total) by mouth 3 (three) times daily for 5 days.   cyclobenzaprine 5 MG tablet Commonly known as: FLEXERIL Take 5 mg by mouth 3 (three) times daily.   doxycycline 100 MG tablet Commonly known as: VIBRA-TABS Take 1 tablet (100 mg total) by mouth 2 (two) times daily for 5 days.   fluticasone 50 MCG/ACT nasal spray Commonly known as: FLONASE Place 2 sprays into both nostrils daily.   furosemide 20 MG tablet Commonly known as: Lasix Take 1 tablet (20 mg total) by mouth daily.   gabapentin 300 MG capsule Commonly known as: NEURONTIN Take 1 capsule by mouth 3 (three) times daily.   HM Potassium 595 (99 K) MG Tabs tablet Generic drug: potassium gluconate Take 595 mg by mouth daily.   ipratropium-albuterol 0.5-2.5 (3) MG/3ML Soln Commonly known as: DUONEB Take 3 mLs by nebulization every 6 (six) hours as needed (for shortness of breath).   Iron 325 (65 Fe) MG Tabs Take 1 tablet by mouth daily.   lidocaine-prilocaine cream Commonly known as: EMLA Apply 1 application topically as needed. Apply a small amount to port site and cover with plastic wrap 1 hour prior to infusion appointments   lisinopril 20 MG tablet Commonly known as: ZESTRIL Take 1 tablet (20 mg total) by mouth daily.   metoCLOPramide 5 MG tablet Commonly known as: REGLAN Take 1 tablet by mouth 4 (four) times daily -  before meals and at bedtime.   metoprolol succinate 25 MG 24 hr tablet Commonly known as: TOPROL-XL Take 25 mg by mouth daily.   montelukast 10 MG tablet Commonly known as: SINGULAIR Take 1 tablet by mouth daily.   multivitamin with minerals tablet Take 1 tablet by mouth daily.   Myrbetriq 50 MG Tb24 tablet Generic drug: mirabegron ER Take 1 tablet by mouth daily.   nitroGLYCERIN 0.4 MG SL tablet Commonly known as: NITROSTAT Place 0.4 mg under  the tongue every 5 (five) minutes as needed for chest pain.   OPDIVO IV Inject into the vein every 14 (fourteen) days.   Oxycodone HCl 10 MG Tabs Take 1 tablet by mouth every 4 (four) hours.   polyethylene glycol 17 g packet Commonly known as: MIRALAX / GLYCOLAX Take 17 g by mouth daily as needed for mild constipation.   predniSONE 20 MG tablet Commonly known as: Deltasone Take 1 tablet (20 mg total) by mouth daily with breakfast.   PriLOSEC 40 MG capsule Generic drug: omeprazole Take 1 capsule by mouth daily.   simvastatin 10 MG tablet Commonly known as: ZOCOR Take 10 mg by mouth at bedtime.   Ventolin HFA 108 (90 Base) MCG/ACT inhaler Generic drug: albuterol Inhale 2 puffs into the lungs every 4 (four) hours as needed for wheezing or shortness of breath.   Vitamin C 500 MG Caps Take 500 mg by mouth daily.   YERVOY IV Inject into the vein every 6 (six) weeks.       Major procedures and Radiology Reports - PLEASE review detailed and final reports for all details, in brief -    Dg Chest 2 View  Result Date: 08/05/2018 CLINICAL DATA:  Shortness of breath. History of lung cancer and COPD. EXAM: CHEST - 2 VIEW COMPARISON:  08/03/2018. FINDINGS: Normal sized heart. Diffusely prominent interstitial markings with mild improvement. The lungs  remain hyperexpanded. Stable mild blunting of the left lateral costophrenic angle. Stable right subclavian porta catheter. Diffuse osteopenia and old, healed right rib fracture. IMPRESSION: Mildly improved interstitial pulmonary edema or viral pneumonitis superimposed on changes of COPD. Electronically Signed   By: Claudie Revering M.D.   On: 08/05/2018 09:56   Dg Chest 2 View  Result Date: 07/23/2018 CLINICAL DATA:  Cough and shortness of breath. History of lung carcinoma EXAM: CHEST - 2 VIEW COMPARISON:  June 29, 2018 FINDINGS: There is fibrosis throughout the lungs bilaterally. There is less consolidation in the left upper lobe compared to most  recent chest radiograph. There is asymmetric pleural thickening on the left laterally and superiorly, stable. No new opacity evident. Heart size is normal. Pulmonary vascularity on the right is normal. Distortion of pulmonary vascularity on the left is due to the fibrosis. No adenopathy is appreciable by radiography. Port-A-Cath tip is in the superior vena cava. No pneumothorax. There is aortic atherosclerosis. No bone lesions. IMPRESSION: Areas of fibrosis with less opacity in the left upper lobe compared to most recent study. No new opacity evident. Stable cardiac silhouette. Port-A-Cath tip in superior vena cava. Aortic Atherosclerosis (ICD10-I70.0). Electronically Signed   By: Lowella Grip III M.D.   On: 07/23/2018 11:35   Dg Chest Port 1 View  Result Date: 08/03/2018 CLINICAL DATA:  Progressive shortness of breath since yesterday. Generalized weakness and dizziness. Lung cancer with active chemotherapy. EXAM: PORTABLE CHEST 1 VIEW COMPARISON:  Most recent radiograph 07/23/2018. Most recent CT 06/25/2018 FINDINGS: Right chest port with tip in the mid SVC. Left PleurX catheter in place. Volume loss in the left hemithorax with minimal blunting of left costophrenic angle, similar to prior. Diffuse increase in interstitial and patchy opacity throughout both lungs since prior exam. Unchanged heart size and mediastinal contours with left hilar fullness. Underlying emphysema. No evidence of large right pleural effusion. No visualized pneumothorax. IMPRESSION: 1. Diffuse increase in interstitial and patchy opacity throughout both lungs since prior exam. This may be represent pulmonary edema versus multifocal pneumonia, including atypical viral infection. 2. Left lung cancer. Chronic volume loss in the left hemithorax. Left PleurX catheter in place, unchanged blunting of left costophrenic angle. Electronically Signed   By: Keith Rake M.D.   On: 08/03/2018 19:05    Micro Results    Recent Results (from  the past 240 hour(s))  SARS Coronavirus 2 (CEPHEID - Performed in [facility] hospital lab), Hosp Order     Status: None   Collection Time: 08/03/18  7:41 PM   Specimen: Nasopharyngeal Swab  Result Value Ref Range Status   SARS Coronavirus 2 NEGATIVE NEGATIVE Final    Comment: (NOTE) If result is NEGATIVE SARS-CoV-2 target nucleic acids are NOT DETECTED. The SARS-CoV-2 RNA is generally detectable in upper and lower  respiratory specimens during the acute phase of infection. The lowest  concentration of SARS-CoV-2 viral copies this assay can detect is 250  copies / mL. A negative result does not preclude SARS-CoV-2 infection  and should not be used as the sole basis for treatment or other  patient management decisions.  A negative result may occur with  improper specimen collection / handling, submission of specimen other  than nasopharyngeal swab, presence of viral mutation(s) within the  areas targeted by this assay, and inadequate number of viral copies  (<250 copies / mL). A negative result must be combined with clinical  observations, patient history, and epidemiological information. If result is POSITIVE SARS-CoV-2 target nucleic  acids are DETECTED. The SARS-CoV-2 RNA is generally detectable in upper and lower  respiratory specimens dur ing the acute phase of infection.  Positive  results are indicative of active infection with SARS-CoV-2.  Clinical  correlation with patient history and other diagnostic information is  necessary to determine patient infection status.  Positive results do  not rule out bacterial infection or co-infection with other viruses. If result is PRESUMPTIVE POSTIVE SARS-CoV-2 nucleic acids MAY BE PRESENT.   A presumptive positive result was obtained on the submitted specimen  and confirmed on repeat testing.  While 2019 novel coronavirus  (SARS-CoV-2) nucleic acids may be present in the submitted sample  additional confirmatory testing may be necessary  for epidemiological  and / or clinical management purposes  to differentiate between  SARS-CoV-2 and other Sarbecovirus currently known to infect humans.  If clinically indicated additional testing with an alternate test  methodology [phone]) is advised. The SARS-CoV-2 RNA is generally  detectable in upper and lower respiratory sp ecimens during the acute  phase of infection. The expected result is Negative. Fact Sheet for Patients:  StrictlyIdeas.no Fact Sheet for Healthcare Providers: BankingDealers.co.za This test is not yet approved or cleared by the [place] FDA and has been authorized for detection and/or diagnosis of SARS-CoV-2 by FDA under an Emergency Use Authorization (EUA).  This EUA will remain in effect (meaning this test can be used) for the duration of the COVID-19 declaration under Section 564(b)(1) of the Act, 21 U.S.C. section 360bbb-3(b)(1), unless the authorization is terminated or revoked sooner. Performed at [facility], [address], [place], [place] [address]   Culture, blood (Routine X 2) w Reflex to ID Panel     Status: None (Preliminary result)   Collection Time: 08/03/18  7:58 PM   Specimen: BLOOD  Result Value Ref Range Status   Specimen Description BLOOD RIGHT ANTECUBITAL  Final   Special Requests   Final    BOTTLES DRAWN AEROBIC AND ANAEROBIC Blood Culture adequate volume   Culture   Final    NO GROWTH 2 DAYS Performed at [facility], [address] [place], [place] [address]    Report Status PENDING  Incomplete  Culture, blood (Routine X 2) w Reflex to ID Panel     Status: None (Preliminary result)   Collection Time: 08/03/18  8:02 PM   Specimen: BLOOD RIGHT HAND  Result Value Ref Range Status   Specimen Description BLOOD RIGHT HAND  Final   Special Requests   Final    BOTTLES DRAWN AEROBIC AND ANAEROBIC Blood Culture adequate volume   Culture   Final    NO GROWTH 2 DAYS Performed at [facility], [address], [place], [place] [address]    Report Status PENDING  Incomplete  MRSA PCR Screening     Status: None   Collection Time: 08/03/18  9:13 PM   Specimen: Nasal Mucosa; Nasopharyngeal  Result Value Ref Range Status   MRSA by PCR NEGATIVE NEGATIVE Final    Comment:        The GeneXpert MRSA Assay (FDA approved for NASAL specimens only), is one component of a comprehensive MRSA colonization surveillance program. It is not intended to diagnose MRSA infection nor to guide or monitor treatment for MRSA infections. Performed at [facility], [address] [place], [place] [address]        Today   Subjective    Joquan Lotz today has no new complaints, No fever  Or chills\  No  nausea, vomiting, diarrhea           Patient has been seen and examined prior to discharge   Objective   Blood pressure 138/76, pulse 78, temperature 98.2 F (36.8 C), temperature source Oral, resp. rate 18, height 6' (1.829 m), weight 69.8 kg, SpO2 94 %.   Intake/Output Summary (Last 24 hours) at 08/05/2018 1543 Last data filed at 08/05/2018 1215 Gross per 24 hour  Intake 1660 ml  Output 1800 ml  Net -140 ml    Exam Gen:- Awake Alert, no acute distress, speaking in complete sentences HEENT:- [place].AT, No sclera icterus Nose- [place] 3 L/min Neck-Supple Neck,No JVD,.  Lungs-improving air movement, no wheezing, left-sided Pleurx catheter in situ CV- S1, S2 normal, regular Abd-  +ve B.Sounds, Abd Soft, No tenderness,   \ Extremity/Skin:- No  edema,   good pulses Psych-affect is appropriate, oriented x3 Neuro-no new focal deficits, no tremors    Data Review   CBC w Diff:  Lab Results  Component Value Date   WBC 8.0 08/04/2018   HGB 12.1 (L) 08/04/2018   HCT 38.2 (L) 08/04/2018   PLT 496 (H) 08/04/2018   LYMPHOPCT 24 08/03/2018   MONOPCT 9 08/03/2018   EOSPCT 5 08/03/2018   BASOPCT 1 08/03/2018    CMP:  Lab Results  Component Value Date   NA 137 08/03/2018   K 3.7  08/03/2018   CL 101 08/03/2018   CO2 24 08/03/2018   BUN 22 08/03/2018   CREATININE 1.07 08/03/2018   PROT 9.2 (H) 08/03/2018   ALBUMIN 3.1 (L) 08/03/2018   BILITOT 0.8 08/03/2018   ALKPHOS 39 08/03/2018   AST 17 08/03/2018   ALT 11 08/03/2018  .   Total Discharge time is about 33 minutes  Roxan Hockey M.D on 08/05/2018 at 3:43 PM  Go to [email or web address] -  for contact info  [facility] - Office  [phone]

## 2018-08-06 DIAGNOSIS — J440 Chronic obstructive pulmonary disease with acute lower respiratory infection: Secondary | ICD-10-CM | POA: Diagnosis not present

## 2018-08-06 DIAGNOSIS — Z96641 Presence of right artificial hip joint: Secondary | ICD-10-CM | POA: Diagnosis not present

## 2018-08-06 DIAGNOSIS — G709 Myoneural disorder, unspecified: Secondary | ICD-10-CM | POA: Diagnosis not present

## 2018-08-06 DIAGNOSIS — G8929 Other chronic pain: Secondary | ICD-10-CM | POA: Diagnosis not present

## 2018-08-06 DIAGNOSIS — Z791 Long term (current) use of non-steroidal anti-inflammatories (NSAID): Secondary | ICD-10-CM | POA: Diagnosis not present

## 2018-08-06 DIAGNOSIS — J90 Pleural effusion, not elsewhere classified: Secondary | ICD-10-CM | POA: Diagnosis not present

## 2018-08-06 DIAGNOSIS — Z7951 Long term (current) use of inhaled steroids: Secondary | ICD-10-CM | POA: Diagnosis not present

## 2018-08-06 DIAGNOSIS — I10 Essential (primary) hypertension: Secondary | ICD-10-CM | POA: Diagnosis not present

## 2018-08-06 DIAGNOSIS — J189 Pneumonia, unspecified organism: Secondary | ICD-10-CM | POA: Diagnosis not present

## 2018-08-06 DIAGNOSIS — C3490 Malignant neoplasm of unspecified part of unspecified bronchus or lung: Secondary | ICD-10-CM | POA: Diagnosis not present

## 2018-08-06 DIAGNOSIS — Z7982 Long term (current) use of aspirin: Secondary | ICD-10-CM | POA: Diagnosis not present

## 2018-08-06 DIAGNOSIS — C787 Secondary malignant neoplasm of liver and intrahepatic bile duct: Secondary | ICD-10-CM | POA: Diagnosis not present

## 2018-08-06 DIAGNOSIS — I2510 Atherosclerotic heart disease of native coronary artery without angina pectoris: Secondary | ICD-10-CM | POA: Diagnosis not present

## 2018-08-06 DIAGNOSIS — Z96652 Presence of left artificial knee joint: Secondary | ICD-10-CM | POA: Diagnosis not present

## 2018-08-06 DIAGNOSIS — M545 Low back pain: Secondary | ICD-10-CM | POA: Diagnosis not present

## 2018-08-08 LAB — CULTURE, BLOOD (ROUTINE X 2)
Culture: NO GROWTH
Culture: NO GROWTH
Special Requests: ADEQUATE
Special Requests: ADEQUATE

## 2018-08-09 ENCOUNTER — Telehealth (HOSPITAL_COMMUNITY): Payer: Self-pay | Admitting: *Deleted

## 2018-08-09 NOTE — Telephone Encounter (Signed)
Patient called clinic today stating that he is taking Cephalexin and doxycycline.  He is expected to take his last dose tomorrow. He is questioning if he can get his treatment tomorrow.  Patient advised to come for his appointments tomorrow and Dr. Delton Coombes can evaluate him for treatment. He verbalizes understanding.

## 2018-08-09 NOTE — Telephone Encounter (Signed)
This encounter was created in error - please disregard.

## 2018-08-10 ENCOUNTER — Inpatient Hospital Stay (HOSPITAL_COMMUNITY): Payer: Medicare Other | Attending: Hematology

## 2018-08-10 ENCOUNTER — Inpatient Hospital Stay (HOSPITAL_BASED_OUTPATIENT_CLINIC_OR_DEPARTMENT_OTHER): Payer: Medicare Other | Admitting: Hematology

## 2018-08-10 ENCOUNTER — Encounter (HOSPITAL_COMMUNITY): Payer: Self-pay | Admitting: Hematology

## 2018-08-10 ENCOUNTER — Inpatient Hospital Stay (HOSPITAL_COMMUNITY): Payer: Medicare Other

## 2018-08-10 ENCOUNTER — Other Ambulatory Visit: Payer: Self-pay

## 2018-08-10 VITALS — BP 118/66 | HR 109 | Temp 97.8°F | Resp 20 | Wt 152.4 lb

## 2018-08-10 VITALS — BP 119/59 | HR 85 | Temp 97.5°F | Resp 18

## 2018-08-10 DIAGNOSIS — C787 Secondary malignant neoplasm of liver and intrahepatic bile duct: Secondary | ICD-10-CM | POA: Diagnosis not present

## 2018-08-10 DIAGNOSIS — S72009A Fracture of unspecified part of neck of unspecified femur, initial encounter for closed fracture: Secondary | ICD-10-CM | POA: Diagnosis not present

## 2018-08-10 DIAGNOSIS — Z5112 Encounter for antineoplastic immunotherapy: Secondary | ICD-10-CM | POA: Diagnosis not present

## 2018-08-10 DIAGNOSIS — J449 Chronic obstructive pulmonary disease, unspecified: Secondary | ICD-10-CM | POA: Diagnosis not present

## 2018-08-10 DIAGNOSIS — J189 Pneumonia, unspecified organism: Secondary | ICD-10-CM | POA: Diagnosis not present

## 2018-08-10 DIAGNOSIS — C3492 Malignant neoplasm of unspecified part of left bronchus or lung: Secondary | ICD-10-CM

## 2018-08-10 DIAGNOSIS — J9621 Acute and chronic respiratory failure with hypoxia: Secondary | ICD-10-CM | POA: Diagnosis not present

## 2018-08-10 DIAGNOSIS — Z79899 Other long term (current) drug therapy: Secondary | ICD-10-CM | POA: Diagnosis not present

## 2018-08-10 DIAGNOSIS — J439 Emphysema, unspecified: Secondary | ICD-10-CM | POA: Diagnosis not present

## 2018-08-10 DIAGNOSIS — J438 Other emphysema: Secondary | ICD-10-CM | POA: Diagnosis not present

## 2018-08-10 DIAGNOSIS — Z9981 Dependence on supplemental oxygen: Secondary | ICD-10-CM | POA: Diagnosis not present

## 2018-08-10 DIAGNOSIS — R0602 Shortness of breath: Secondary | ICD-10-CM | POA: Diagnosis not present

## 2018-08-10 LAB — CBC WITH DIFFERENTIAL/PLATELET
Abs Immature Granulocytes: 0.26 10*3/uL — ABNORMAL HIGH (ref 0.00–0.07)
Basophils Absolute: 0.1 10*3/uL (ref 0.0–0.1)
Basophils Relative: 1 %
Eosinophils Absolute: 0.8 10*3/uL — ABNORMAL HIGH (ref 0.0–0.5)
Eosinophils Relative: 4 %
HCT: 44.5 % (ref 39.0–52.0)
Hemoglobin: 13.8 g/dL (ref 13.0–17.0)
Immature Granulocytes: 1 %
Lymphocytes Relative: 25 %
Lymphs Abs: 5.2 10*3/uL — ABNORMAL HIGH (ref 0.7–4.0)
MCH: 28.4 pg (ref 26.0–34.0)
MCHC: 31 g/dL (ref 30.0–36.0)
MCV: 91.6 fL (ref 80.0–100.0)
Monocytes Absolute: 1.4 10*3/uL — ABNORMAL HIGH (ref 0.1–1.0)
Monocytes Relative: 7 %
Neutro Abs: 12.6 10*3/uL — ABNORMAL HIGH (ref 1.7–7.7)
Neutrophils Relative %: 62 %
Platelets: 639 10*3/uL — ABNORMAL HIGH (ref 150–400)
RBC: 4.86 MIL/uL (ref 4.22–5.81)
RDW: 17.3 % — ABNORMAL HIGH (ref 11.5–15.5)
WBC: 20.3 10*3/uL — ABNORMAL HIGH (ref 4.0–10.5)
nRBC: 0 % (ref 0.0–0.2)

## 2018-08-10 LAB — COMPREHENSIVE METABOLIC PANEL
ALT: 11 U/L (ref 0–44)
AST: 15 U/L (ref 15–41)
Albumin: 3 g/dL — ABNORMAL LOW (ref 3.5–5.0)
Alkaline Phosphatase: 32 U/L — ABNORMAL LOW (ref 38–126)
Anion gap: 11 (ref 5–15)
BUN: 19 mg/dL (ref 8–23)
CO2: 23 mmol/L (ref 22–32)
Calcium: 8.5 mg/dL — ABNORMAL LOW (ref 8.9–10.3)
Chloride: 102 mmol/L (ref 98–111)
Creatinine, Ser: 0.99 mg/dL (ref 0.61–1.24)
GFR calc Af Amer: >60 mL/min (ref >60)
GFR calc non Af Amer: >60 mL/min (ref >60)
Glucose, Bld: 120 mg/dL — ABNORMAL HIGH (ref 70–99)
Potassium: 3.2 mmol/L — ABNORMAL LOW (ref 3.5–5.1)
Sodium: 136 mmol/L (ref 135–145)
Total Bilirubin: 0.8 mg/dL (ref 0.3–1.2)
Total Protein: 8.5 g/dL — ABNORMAL HIGH (ref 6.5–8.1)

## 2018-08-10 LAB — TSH: TSH: 0.479 u[IU]/mL (ref 0.350–4.500)

## 2018-08-10 MED ORDER — DIPHENHYDRAMINE HCL 25 MG PO CAPS
25.0000 mg | ORAL_CAPSULE | Freq: Once | ORAL | Status: AC
  Administered 2018-08-10: 25 mg via ORAL

## 2018-08-10 MED ORDER — DIPHENHYDRAMINE HCL 25 MG PO CAPS
ORAL_CAPSULE | ORAL | Status: AC
  Filled 2018-08-10: qty 1

## 2018-08-10 MED ORDER — SODIUM CHLORIDE 0.9 % IV SOLN
Freq: Once | INTRAVENOUS | Status: AC
  Administered 2018-08-10: 10:00:00 via INTRAVENOUS

## 2018-08-10 MED ORDER — FAMOTIDINE 20 MG PO TABS
20.0000 mg | ORAL_TABLET | Freq: Once | ORAL | Status: AC
  Administered 2018-08-10: 11:00:00 20 mg via ORAL
  Filled 2018-08-10: qty 1

## 2018-08-10 MED ORDER — SODIUM CHLORIDE 0.9% FLUSH
10.0000 mL | INTRAVENOUS | Status: DC | PRN
  Administered 2018-08-10: 10 mL
  Filled 2018-08-10: qty 10

## 2018-08-10 MED ORDER — HEPARIN SOD (PORK) LOCK FLUSH 100 UNIT/ML IV SOLN
500.0000 [IU] | Freq: Once | INTRAVENOUS | Status: AC | PRN
  Administered 2018-08-10: 500 [IU]

## 2018-08-10 MED ORDER — SODIUM CHLORIDE 0.9 % IV SOLN
1.0000 mg/kg | Freq: Once | INTRAVENOUS | Status: AC
  Administered 2018-08-10: 75 mg via INTRAVENOUS
  Filled 2018-08-10: qty 15

## 2018-08-10 MED ORDER — FAMOTIDINE IN NACL 20-0.9 MG/50ML-% IV SOLN
INTRAVENOUS | Status: AC
  Filled 2018-08-10: qty 50

## 2018-08-10 MED ORDER — SODIUM CHLORIDE 0.9 % IV SOLN
3.0000 mg/kg | Freq: Once | INTRAVENOUS | Status: AC
  Administered 2018-08-10: 12:00:00 220 mg via INTRAVENOUS
  Filled 2018-08-10: qty 10

## 2018-08-10 NOTE — Patient Instructions (Signed)
[facility] Discharge Instructions for Patients Receiving Chemotherapy  Today you received the following chemotherapy agents   To help prevent nausea and vomiting after your treatment, we encourage you to take your nausea medication   If you develop nausea and vomiting that is not controlled by your nausea medication, call the clinic.   BELOW ARE SYMPTOMS THAT SHOULD BE REPORTED IMMEDIATELY:  *FEVER GREATER THAN 100.5 F  *CHILLS WITH OR WITHOUT FEVER  NAUSEA AND VOMITING THAT IS NOT CONTROLLED WITH YOUR NAUSEA MEDICATION  *UNUSUAL SHORTNESS OF BREATH  *UNUSUAL BRUISING OR BLEEDING  TENDERNESS IN MOUTH AND THROAT WITH OR WITHOUT PRESENCE OF ULCERS  *URINARY PROBLEMS  *BOWEL PROBLEMS  UNUSUAL RASH Items with * indicate a potential emergency and should be followed up as soon as possible.  Feel free to call the clinic should you have any questions or concerns. The clinic phone number is [phone].  Please show the CHEMO ALERT CARD at check-in to the Emergency Department and triage nurse.   

## 2018-08-10 NOTE — Assessment & Plan Note (Signed)
1.  Non-small cell lung cancer, adenocarcinoma: - Foundation 1 testing shows MS-stable, TMB 20 muts/Mb, BRAF amplification, MET amplification, MYC and KIT amplification, PDGFRA/SOX2 amplifications.  PD-L1 could not be done because of scant tissue. - Biopsy of the liver lesion on 03/17/2018 for molecular study showed poorly differentiated carcinoma. -Combination immunotherapy with ipilimumab and nivolumab cycle 1 on 04/26/2018 and cycle 2 on 06/08/2018. - He had multiple hospitalizations after start of immunotherapy. - CT of the chest on 06/13/2018 showed new anterior mediastinal mass suspicious for recurrent tumor.  New liver lesions.  This was compared from PET scan on February 15, 2018.  He did not start treatments for 2 months after that scan.  We did not have a baseline scan. -Left Pleurx catheter placed on 06/28/2018.  It is draining very minimal amount. -Nivolumab restarted on 07/12/2018. - Admitted from 08/03/2018 through 08/05/2018 with exacerbation of COPD and healthcare associated pneumonia.  He finished antibiotics yesterday.  His breathing is back to baseline.  He is on 4 L nasal cannula at this time.  Denies any cough or hemoptysis.  No other expectoration. -We reviewed his blood work.  His white count is mildly elevated as he finished steroids few days ago. - We will proceed with his next cycle of ipilimumab and Opdivo.  I will see him back in 2 weeks.  I plan to repeat scans after next treatment.   2.  Left chest wall pain: -This has improved since the Pleurx catheter placement. -He is taking oxycodone 10 mg every 4 hours as needed.

## 2018-08-10 NOTE — Patient Instructions (Signed)
[facility] at [facility] Discharge Instructions  You were seen today by Dr. Katragadda. He went over your recent lab results. He will see you back in 2 weeks for labs and follow up.   Thank you for choosing [facility] at [facility] to provide your oncology and hematology care.  To afford each patient quality time with our provider, please arrive at least 15 minutes before your scheduled appointment time.   If you have a lab appointment with the Cancer Center please come in thru the  Main Entrance and check in at the main information desk  You need to re-schedule your appointment should you arrive 10 or more minutes late.  We strive to give you quality time with our providers, and arriving late affects you and other patients whose appointments are after yours.  Also, if you no show three or more times for appointments you may be dismissed from the clinic at the providers discretion.     Again, thank you for choosing [facility].  Our hope is that these requests will decrease the amount of time that you wait before being seen by our physicians.       _____________________________________________________________  Should you have questions after your visit to [facility], please contact our office at [phone] between the hours of 8:00 a.m. and 4:30 p.m.  Voicemails left after 4:00 p.m. will not be returned until the following business day.  For prescription refill requests, have your pharmacy contact our office and allow 72 hours.    Cancer Center Support Programs:   > Cancer Support Group  2nd Tuesday of the month 1pm-2pm, Journey Room    

## 2018-08-10 NOTE — Progress Notes (Signed)
Pt presents today for treatment and office visit. Pt Afebrile. HR 109 today. MD aware. Labs reviewed. WBC 20.3. K+3.2 MD aware. VO received to proceed with treatment. Pt has been in the hospital per Dr.Katragadda for pneumonia. VO received to proceed with treatment.   HR recheck 94  Treatment given today per MD orders. Tolerated infusion without adverse affects. Vital signs stable. No complaints at this time. Discharged from clinic via wheel chair. F/U with [facility] as scheduled.

## 2018-08-10 NOTE — Progress Notes (Signed)
Bradley [name], Bradley [name] [id number]   CLINIC:  Medical Oncology/Hematology  PCP:  Jani Gravel, MD [place] [address] [phone]   REASON FOR VISIT:  Follow-up for  Non-small cell lung cancer   BRIEF ONCOLOGIC HISTORY:  Oncology History  Non-small cell carcinoma of left lung ([place])  12/29/2017 Initial Diagnosis   Non-small cell lung cancer ([place])   04/26/2018 -  Chemotherapy   The patient had ipilimumab (YERVOY) 75 mg in sodium chloride 0.9 % 50 mL chemo infusion, 1 mg/kg = 75 mg (100 % of original dose 1 mg/kg), Intravenous,  Once, 3 of 3 cycles Dose modification: 1 mg/kg (original dose 1 mg/kg, Cycle 1, Reason: Other (see comments), Comment: per trial) Administration: 75 mg (04/26/2018), 75 mg (06/08/2018), 75 mg (08/10/2018) nivolumab (OPDIVO) 220 mg in sodium chloride 0.9 % 100 mL chemo infusion, 3 mg/kg = 220 mg (100 % of original dose 3 mg/kg), Intravenous, Once, 7 of 9 cycles Dose modification: 3 mg/kg (original dose 3 mg/kg, Cycle 1, Reason: Other (see comments), Comment: Per trial) Administration: 220 mg (04/26/2018), 220 mg (06/08/2018), 220 mg (05/10/2018), 220 mg (08/10/2018), 220 mg (05/24/2018), 220 mg (07/12/2018), 220 mg (07/26/2018)  for chemotherapy treatment.       CANCER STAGING: Cancer Staging No matching staging information was found for the patient.   INTERVAL HISTORY:  Bradley [name] 78 y.o. male returns for follow-up of lung cancer.  He was recently hospitalized for 2 days for healthcare associated pneumonia last week.  He finished oral antibiotic and steroids yesterday.  He reports that his breathing is back to baseline.  Shortness of breath on exertion present.  He is currently on oxygen 4 L/min.  Chest pain is well controlled with oxycodone every 4 hours.  He reports that nothing drained from the left Pleurx catheter 10 days ago.  He is having it removed and of the month.  Appetite is 100%.  Energy  levels are 25%.    REVIEW OF SYSTEMS:  Review of Systems  Respiratory: Positive for shortness of breath. Negative for cough.   Cardiovascular: Positive for chest pain.  Neurological: Negative for numbness.  All other systems reviewed and are negative.    PAST MEDICAL/SURGICAL HISTORY:  Past Medical History:  Diagnosis Date  . Cancer ([place])    lung  . Chronic lower back pain   . Chronic pain    pain management  . Contact with stonefish as cause of accidental injury 1954   "stung across my left hand in [place]; LUE weaker since"  . COPD (chronic obstructive pulmonary disease) ([place])   . Coronary artery disease   . Dyspnea    with exertion  . High cholesterol   . History of blood transfusion 1943  . History of kidney stones    passed  . Hypertension   . Mass of left lung   . Migraine    "none since the 1990s" (09/11/2016)  . Myocardial infarction ([place]) 01/20/1993; 02/28/1993  . Neuromuscular disorder ([facility])    neuropathy left  . On home O2   . Pneumonia    "several times" (09/11/2016)  . Tobacco abuse    ongoing   Past Surgical History:  Procedure Laterality Date  . APPENDECTOMY    . CARDIAC CATHETERIZATION    . CATARACT EXTRACTION, BILATERAL Bilateral   . CHEST TUBE INSERTION Left 06/28/2018   Procedure: INSERTION PLEURAL DRAINAGE CATHETER;  Surgeon: Ivin Poot, MD;  Location: [facility];  Service: Thoracic;  Laterality: Left;  . COLONOSCOPY    . HIP ARTHROPLASTY Right 12/20/2016   Procedure: ARTHROPLASTY BIPOLAR HIP (HEMIARTHROPLASTY);  Surgeon: [name], MD;  Location: [place];  Service: Orthopedics;  Laterality: Right;  . IR THORACENTESIS ASP PLEURAL SPACE W/IMG GUIDE  06/15/2018  . JOINT REPLACEMENT    . LEFT HEART CATH AND CORONARY ANGIOGRAPHY N/A 09/12/2016   Procedure: LEFT HEART CATH AND CORONARY ANGIOGRAPHY;  Surgeon: Burnell Blanks, MD;  Location: [place] [facility];  Service: Cardiovascular;  Laterality: N/A;  . NASAL ENDOSCOPY WITH EPISTAXIS CONTROL  Right 03/15/2016   Procedure: NASAL ENDOSCOPY WITH EPISTAXIS CONTROL;  Surgeon: Jodi Marble, MD;  Location: [place];  Service: ENT;  Laterality: Right;  . PORTACATH PLACEMENT Right 04/23/2018   Procedure: INSERTION PORT-A-CATH;  Surgeon: Aviva Signs, MD;  Location: [facility];  Service: General;  Laterality: Right;  . SEPTOPLASTY Right 03/15/2016   Procedure: SEPTOPLASTY;  Surgeon: Jodi Marble, MD;  Location: [place];  Service: ENT;  Laterality: Right;  . SINUS EXPLORATION Right 03/15/2016   Procedure: SINUS EXPLORATION;  Surgeon: Jodi Marble, MD;  Location: [place];  Service: ENT;  Laterality: Right;  . TONSILLECTOMY    . TOOTH EXTRACTION    . TOTAL KNEE ARTHROPLASTY Left   . VIDEO BRONCHOSCOPY WITH ENDOBRONCHIAL ULTRASOUND N/A 12/15/2017   Procedure: VIDEO BRONCHOSCOPY WITH ENDOBRONCHIAL ULTRASOUND;  Surgeon: Rigoberto Noel, MD;  Location: [place];  Service: Thoracic;  Laterality: N/A;     SOCIAL HISTORY:  Social History   Socioeconomic History  . Marital status: Widowed    Spouse name: Not on file  . Number of children: Not on file  . Years of education: Not on file  . Highest education level: Not on file  Occupational History  . Not on file  Social Needs  . Financial resource strain: Patient refused  . Food insecurity    Worry: Patient refused    Inability: Patient refused  . Transportation needs    Medical: Patient refused    Non-medical: Patient refused  Tobacco Use  . Smoking status: Current Every Day Smoker    Packs/day: 1.00    Years: 70.00    Pack years: 70.00    Types: Cigarettes  . Smokeless tobacco: Never Used  . Tobacco comment: smokes 8 a day  Substance and Sexual Activity  . Alcohol use: No    Comment: 11/26/2017  "drank from the age of 8 til 03/05/1993"  . Drug use: Not Currently    Comment: 11/26/2017 "used some drugs when I was young"  . Sexual activity: Not Currently  Lifestyle  . Physical activity    Days per week: Patient refused    Minutes per session:  Patient refused  . Stress: To some extent  Relationships  . Social Herbalist on phone: Patient refused    Gets together: Patient refused    Attends religious service: Patient refused    Active member of club or organization: Patient refused    Attends meetings of clubs or organizations: Patient refused    Relationship status: Patient refused  . Intimate partner violence    Fear of current or ex partner: Patient refused    Emotionally abused: Patient refused    Physically abused: Patient refused    Forced sexual activity: Patient refused  Other Topics Concern  . Not on file  Social History Narrative  . Not on file    FAMILY HISTORY:  Family History  Adopted: Yes  Problem Relation Age of Onset  . Cancer Mother   . Obesity Father     CURRENT MEDICATIONS:  Outpatient Encounter Medications as of 08/10/2018  Medication Sig Note  . ADVAIR HFA 115-21 MCG/ACT inhaler Inhale 2 puffs into the lungs 2 (two) times daily.   [name] amLODipine (NORVASC) 5 MG tablet TAKE 1 TABLET BY MOUTH EVERY DAY (Patient taking differently: Take 5 mg by mouth daily. )   . Ascorbic Acid (VITAMIN C) 500 MG CAPS Take 500 mg by mouth daily.   [name] aspirin EC 81 MG tablet Take 81 mg by mouth daily.    . cyclobenzaprine (FLEXERIL) 5 MG tablet Take 5 mg by mouth 3 (three) times daily.    [name] doxycycline (VIBRA-TABS) 100 MG tablet Take 1 tablet (100 mg total) by mouth 2 (two) times daily for 5 days.   . Ferrous Sulfate (IRON) 325 (65 Fe) MG TABS Take 1 tablet by mouth daily.   . fluticasone (FLONASE) 50 MCG/ACT nasal spray Place 2 sprays into both nostrils daily.   . furosemide (LASIX) 20 MG tablet Take 1 tablet (20 mg total) by mouth daily.   [name] gabapentin (NEURONTIN) 300 MG capsule Take 1 capsule by mouth 3 (three) times daily.   [name] Ipilimumab (YERVOY IV) Inject into the vein every 6 (six) weeks.   [name] ipratropium-albuterol (DUONEB) 0.5-2.5 (3) MG/3ML SOLN Take 3 mLs by nebulization every 6 (six) hours as needed  (for shortness of breath).   . lidocaine-prilocaine (EMLA) cream Apply 1 application topically as needed. Apply a small amount to port site and cover with plastic wrap 1 hour prior to infusion appointments   . lisinopril (ZESTRIL) 20 MG tablet Take 1 tablet (20 mg total) by mouth daily.   . metoCLOPramide (REGLAN) 5 MG tablet Take 1 tablet by mouth 4 (four) times daily -  before meals and at bedtime.   . metoprolol succinate (TOPROL-XL) 25 MG 24 hr tablet Take 25 mg by mouth daily.   . montelukast (SINGULAIR) 10 MG tablet Take 1 tablet by mouth daily.   . Multiple Vitamins-Minerals (MULTIVITAMIN WITH MINERALS) tablet Take 1 tablet by mouth daily.   [name] MYRBETRIQ 50 MG TB24 tablet Take 1 tablet by mouth daily.   . nitroGLYCERIN (NITROSTAT) 0.4 MG SL tablet Place 0.4 mg under the tongue every 5 (five) minutes as needed for chest pain.   . Nivolumab (OPDIVO IV) Inject into the vein every 14 (fourteen) days. 08/04/2018: Due for treatment on 08/10/2018  . Oxycodone HCl 10 MG TABS Take 1 tablet by mouth every 4 (four) hours.    . polyethylene glycol (MIRALAX / GLYCOLAX) 17 g packet Take 17 g by mouth daily as needed for mild constipation.   . potassium gluconate (HM POTASSIUM) 595 (99 K) MG TABS tablet Take 595 mg by mouth daily.   . predniSONE (DELTASONE) 20 MG tablet Take 1 tablet (20 mg total) by mouth daily with breakfast.   . PRILOSEC 40 MG capsule Take 1 capsule by mouth daily.   . simvastatin (ZOCOR) 10 MG tablet Take 10 mg by mouth at bedtime.    . VENTOLIN HFA 108 (90 Base) MCG/ACT inhaler Inhale 2 puffs into the lungs every 4 (four) hours as needed for wheezing or shortness of breath.   . [DISCONTINUED] cephALEXin (KEFLEX) 500 MG capsule Take 1 capsule (500 mg total) by mouth 3 (three) times daily for 5 days.    No facility-administered encounter medications on file as  of 08/10/2018.     ALLERGIES:  Allergies  Allergen Reactions  . Aspirin     Allergy to regular Aspirin only        PHYSICAL EXAM:  ECOG Performance status: 1  Vitals:   08/10/18 0800  BP: 118/66  Pulse: (!) 109  Resp: 20  Temp: 97.8 F (36.6 C)  SpO2: 98%   Filed Weights   08/10/18 0800  Weight: 152 lb 6 oz (69.1 kg)    Physical Exam Vitals signs reviewed.  Constitutional:      Appearance: Normal appearance.  Cardiovascular:     Rate and Rhythm: Normal rate and regular rhythm.     Heart sounds: Normal heart sounds.  Pulmonary:     Effort: Pulmonary effort is normal.     Comments: Decreased breath sounds at the left lung base. Abdominal:     General: There is no distension.     Palpations: Abdomen is soft. There is no mass.  Musculoskeletal:        General: No swelling.  Skin:    General: Skin is warm.  Neurological:     General: No focal deficit present.     Mental Status: He is alert and oriented to person, place, and time.  Psychiatric:        Mood and Affect: Mood normal.        Behavior: Behavior normal.      LABORATORY DATA:  I have reviewed the labs as listed.  CBC    Component Value Date/Time   WBC 20.3 (H) 08/10/2018 0804   RBC 4.86 08/10/2018 0804   HGB 13.8 08/10/2018 0804   HCT 44.5 08/10/2018 0804   PLT 639 (H) 08/10/2018 0804   MCV 91.6 08/10/2018 0804   MCH 28.4 08/10/2018 0804   MCHC 31.0 08/10/2018 0804   RDW 17.3 (H) 08/10/2018 0804   LYMPHSABS 5.2 (H) 08/10/2018 0804   MONOABS 1.4 (H) 08/10/2018 0804   EOSABS 0.8 (H) 08/10/2018 0804   BASOSABS 0.1 08/10/2018 0804   CMP Latest Ref Rng & Units 08/10/2018 08/03/2018 07/26/2018  Glucose 70 - 99 mg/dL 120(H) 146(H) 131(H)  BUN 8 - 23 mg/dL '19 22 17  ' Creatinine 0.61 - 1.24 mg/dL 0.99 1.07 1.01  Sodium 135 - 145 mmol/L 136 137 139  Potassium 3.5 - 5.1 mmol/L 3.2(L) 3.7 3.9  Chloride 98 - 111 mmol/L 102 101 105  CO2 22 - 32 mmol/L '23 24 24  ' Calcium 8.9 - 10.3 mg/dL 8.5(L) 8.6(L) 9.0  Total Protein 6.5 - 8.1 g/dL 8.5(H) 9.2(H) 8.4(H)  Total Bilirubin 0.3 - 1.2 mg/dL 0.8 0.8 0.8  Alkaline Phos 38 -  126 U/L 32(L) 39 48  AST 15 - 41 U/L '15 17 16  ' ALT 0 - 44 U/L '11 11 10       ' DIAGNOSTIC IMAGING:  I have independently reviewed the scans and discussed with the patient.     ASSESSMENT & PLAN:   Non-small cell carcinoma of left lung ([place]) 1.  Non-small cell lung cancer, adenocarcinoma: - Foundation 1 testing shows MS-stable, TMB 20 muts/Mb, BRAF amplification, MET amplification, MYC and KIT amplification, PDGFRA/SOX2 amplifications.  PD-L1 could not be done because of scant tissue. - Biopsy of the liver lesion on 03/17/2018 for molecular study showed poorly differentiated carcinoma. -Combination immunotherapy with ipilimumab and nivolumab cycle 1 on 04/26/2018 and cycle 2 on 06/08/2018. - He had multiple hospitalizations after start of immunotherapy. - CT of the chest on 06/13/2018 showed new anterior mediastinal mass  suspicious for recurrent tumor.  New liver lesions.  This was compared from PET scan on February 15, 2018.  He did not start treatments for 2 months after that scan.  We did not have a baseline scan. -Left Pleurx catheter placed on 06/28/2018.  It is draining very minimal amount. -Nivolumab restarted on 07/12/2018. - Admitted from 08/03/2018 through 08/05/2018 with exacerbation of COPD and healthcare associated pneumonia.  He finished antibiotics yesterday.  His breathing is back to baseline.  He is on 4 L nasal cannula at this time.  Denies any cough or hemoptysis.  No other expectoration. -We reviewed his blood work.  His white count is mildly elevated as he finished steroids few days ago. - We will proceed with his next cycle of ipilimumab and Opdivo.  I will see him back in 2 weeks.  I plan to repeat scans after next treatment.   2.  Left chest wall pain: -This has improved since the Pleurx catheter placement. -He is taking oxycodone 10 mg every 4 hours as needed.   Total time spent is 25 minutes with more than 50% of the time spent face-to-face discussing treatment plan,  pain management and coordination of care.  Orders placed this encounter:  Orders Placed This Encounter  Procedures  . CBC with Differential/Platelet  . Comprehensive metabolic panel      Derek Jack, MD [place] [phone]

## 2018-08-12 DIAGNOSIS — G709 Myoneural disorder, unspecified: Secondary | ICD-10-CM | POA: Diagnosis not present

## 2018-08-12 DIAGNOSIS — I10 Essential (primary) hypertension: Secondary | ICD-10-CM | POA: Diagnosis not present

## 2018-08-12 DIAGNOSIS — J90 Pleural effusion, not elsewhere classified: Secondary | ICD-10-CM | POA: Diagnosis not present

## 2018-08-12 DIAGNOSIS — Z791 Long term (current) use of non-steroidal anti-inflammatories (NSAID): Secondary | ICD-10-CM | POA: Diagnosis not present

## 2018-08-12 DIAGNOSIS — M545 Low back pain: Secondary | ICD-10-CM | POA: Diagnosis not present

## 2018-08-12 DIAGNOSIS — G8929 Other chronic pain: Secondary | ICD-10-CM | POA: Diagnosis not present

## 2018-08-12 DIAGNOSIS — Z7982 Long term (current) use of aspirin: Secondary | ICD-10-CM | POA: Diagnosis not present

## 2018-08-12 DIAGNOSIS — J189 Pneumonia, unspecified organism: Secondary | ICD-10-CM | POA: Diagnosis not present

## 2018-08-12 DIAGNOSIS — C787 Secondary malignant neoplasm of liver and intrahepatic bile duct: Secondary | ICD-10-CM | POA: Diagnosis not present

## 2018-08-12 DIAGNOSIS — Z7951 Long term (current) use of inhaled steroids: Secondary | ICD-10-CM | POA: Diagnosis not present

## 2018-08-12 DIAGNOSIS — C3490 Malignant neoplasm of unspecified part of unspecified bronchus or lung: Secondary | ICD-10-CM | POA: Diagnosis not present

## 2018-08-12 DIAGNOSIS — Z96641 Presence of right artificial hip joint: Secondary | ICD-10-CM | POA: Diagnosis not present

## 2018-08-12 DIAGNOSIS — I2510 Atherosclerotic heart disease of native coronary artery without angina pectoris: Secondary | ICD-10-CM | POA: Diagnosis not present

## 2018-08-12 DIAGNOSIS — J440 Chronic obstructive pulmonary disease with acute lower respiratory infection: Secondary | ICD-10-CM | POA: Diagnosis not present

## 2018-08-12 DIAGNOSIS — Z96652 Presence of left artificial knee joint: Secondary | ICD-10-CM | POA: Diagnosis not present

## 2018-08-14 IMAGING — DX DG HIP (WITH OR WITHOUT PELVIS) 2-3V*R*
4 series · 4 of 4 positions shown · non-contrast
Comparison: None.

CLINICAL DATA: Right hip pain after fall.

EXAM:
DG HIP (WITH OR WITHOUT PELVIS) 2-3V RIGHT

[pelvis ap]
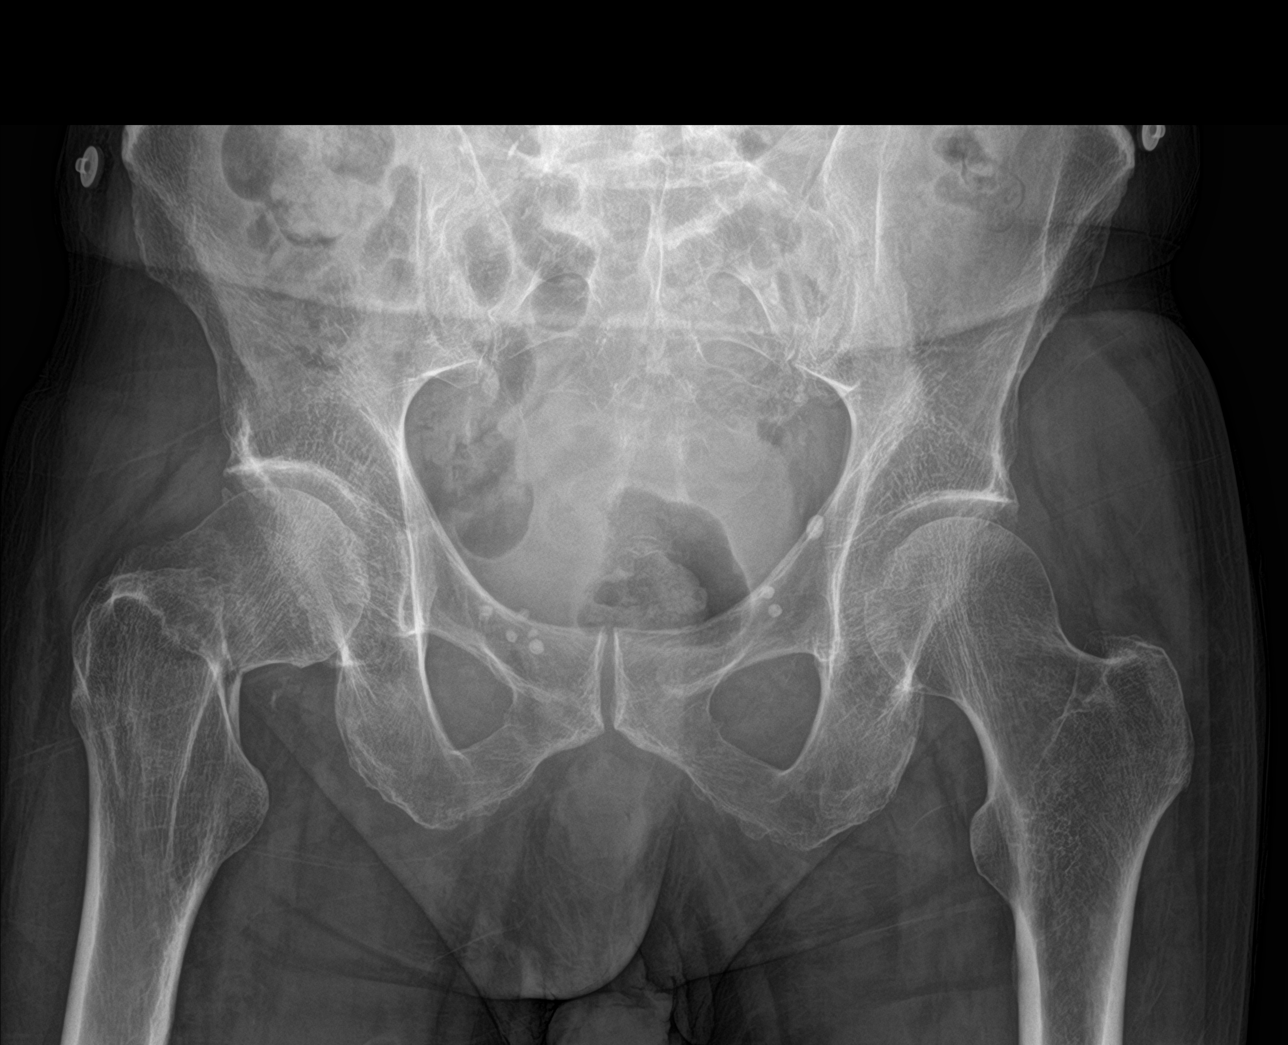

[hip ap]
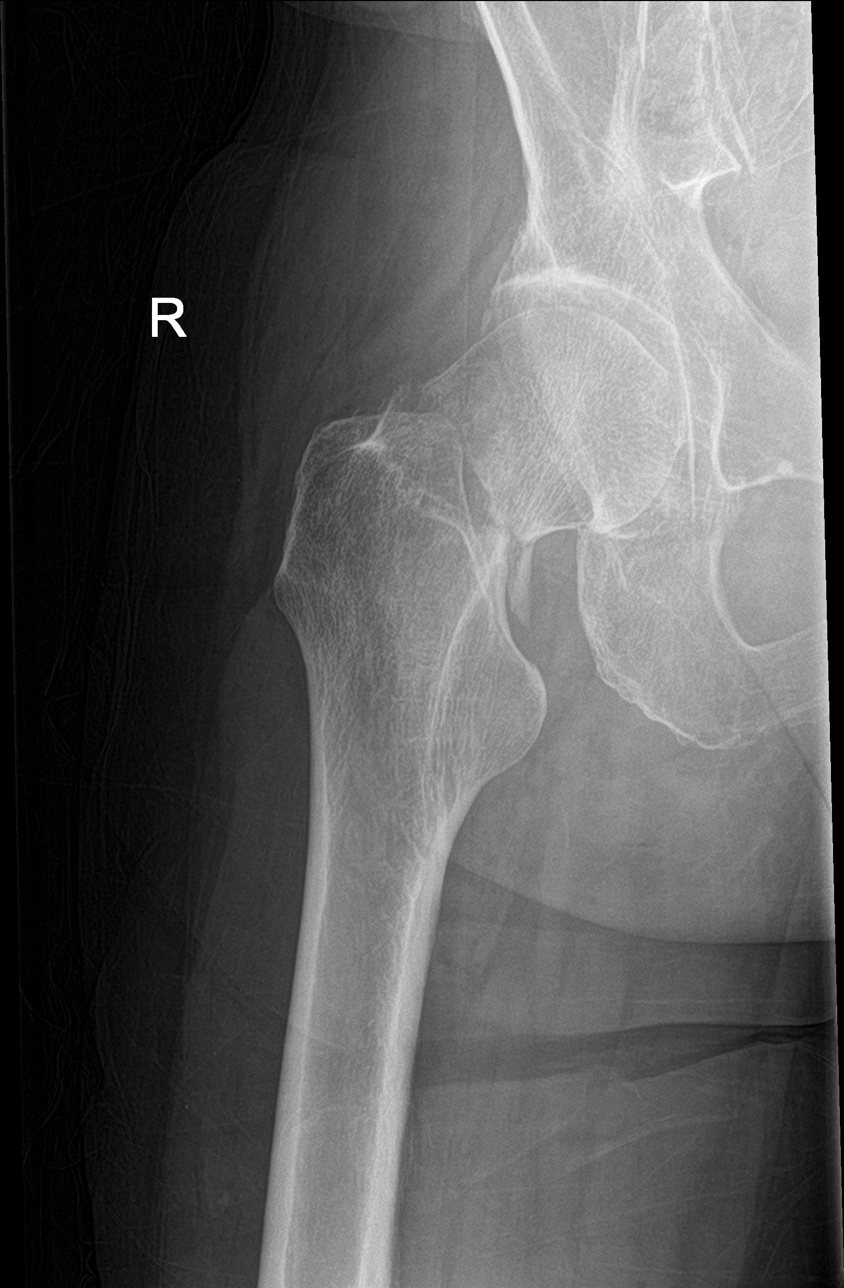

[hip lat (1 of 2)]
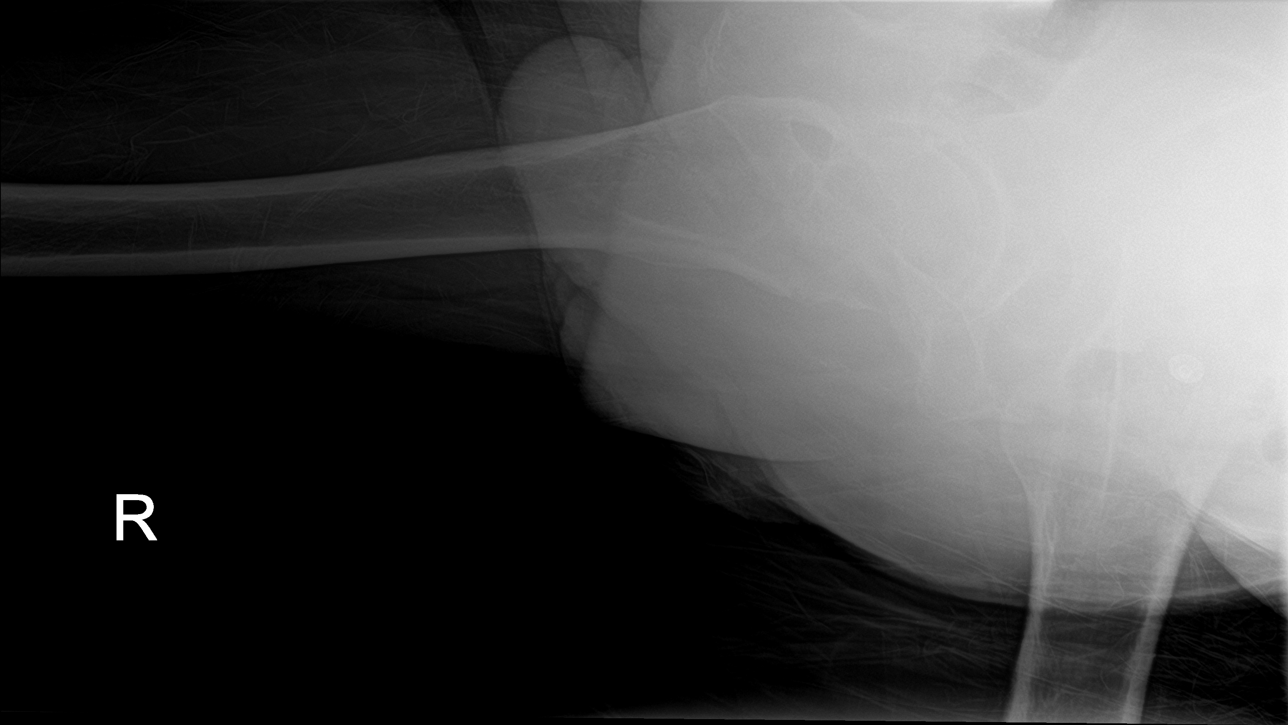

[hip lat (2 of 2)]
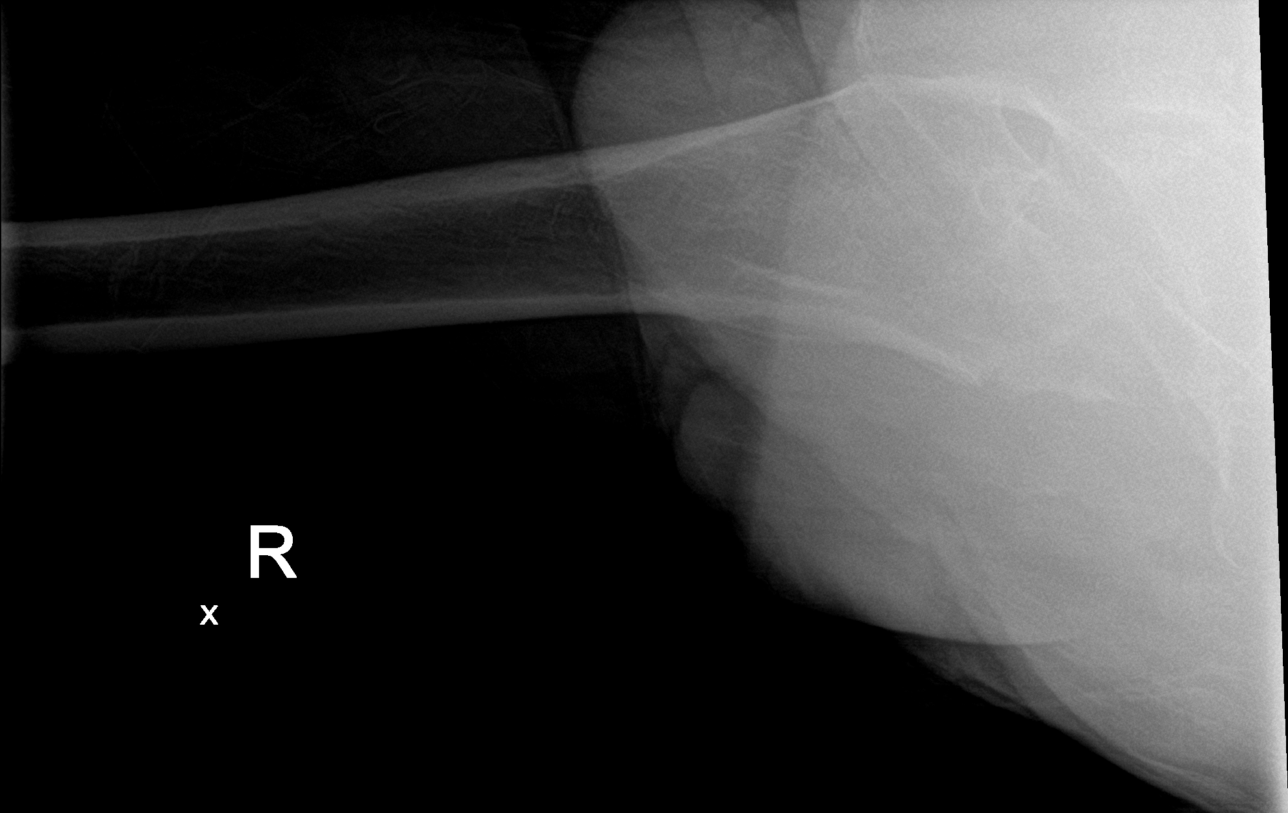

[4 of 4 positions shown; findings below may reference images not displayed]

FINDINGS: Moderately displaced and possibly comminuted fracture is seen
involving the proximal right femoral neck. Left hip appears normal.
IMPRESSION: Moderately displaced and possibly comminuted proximal right femoral
neck fracture.

## 2018-08-16 ENCOUNTER — Other Ambulatory Visit: Payer: Self-pay

## 2018-08-16 ENCOUNTER — Encounter: Payer: Self-pay | Admitting: Internal Medicine

## 2018-08-16 ENCOUNTER — Other Ambulatory Visit: Payer: Medicare Other | Admitting: Internal Medicine

## 2018-08-16 DIAGNOSIS — S72009A Fracture of unspecified part of neck of unspecified femur, initial encounter for closed fracture: Secondary | ICD-10-CM | POA: Diagnosis not present

## 2018-08-16 DIAGNOSIS — J438 Other emphysema: Secondary | ICD-10-CM | POA: Diagnosis not present

## 2018-08-16 DIAGNOSIS — J189 Pneumonia, unspecified organism: Secondary | ICD-10-CM | POA: Diagnosis not present

## 2018-08-16 DIAGNOSIS — Z515 Encounter for palliative care: Secondary | ICD-10-CM

## 2018-08-16 NOTE — Progress Notes (Signed)
July 20th, 2020 [place] Consult Note Telephone: [phone]  Fax: [phone]  *Due to the current COVID-19 infection/crises, the family prefer, and have given their verbal consent for, a provider visit via telemedicine. HIPPA policies of confidentially were discussed. Video-audio (telehealth) contact was unable to be done due technical barriers from the patient's side.  PATIENT NAME: Bradley [name] DOB: 04-Jul-1940 MRN: [id number]  PRIMARY CARE PROVIDER:   Jani Gravel, MD  [facility])  REFERRING PROVIDER:  Jani Gravel, MD [place],  [place] [address]  RESPONSIBLE PARTY: (Patient's cell):  [phone]. *(dtr) Babette Relic (M) [phone]. God-daughter Garen Lah [phone].  [phone]. Granddaughters: Debroah Baller (older), and 78yo Justice. Also in the home is  78 yo "half-pint" Tarri Fuller, daughter of patient's god-daughter Garen Lah.  Assessment/Recommendations:  1. 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 Hard hard of hearing. His hearing aids help. Ambulates with use of cane. Independent all ADL's.  He is continent of bowel and bladder. He denies feeling unsteady on his feet. Physical therapy visited last week and discharged him as he was doing so well. He continues to smoke one third to one pack per day. He wears 02 at 3 to 4 liters per minute. [facility] is switching out his oxygen compressor to 10 milliliter per minute capacity. He has a fill tank on his compressor so can use a portable tanks when out of the home. He discovered he can't utilize the continuous oxygen during the time that his tanks are being filled. I suggested he connect his oxygen  to another available tank that he has, but he said he couldn't do that. I'll check with [facility] so they can educate him how to do so. He  continues with a good appetite, consuming 100% of his meals. His weight is stable; current weight of 150 lb (from 145 last month ago).  at 6" his BMI is 19.7 kg / m2. He has a left chest pleurovac which has not had significant drainage.He has a appointment with his pulmonologists July 29th to have that removed. Patient anticipate being able to sleep better once that tube is out because he has a lifetime habit of sleeping on that left side.  2. Family supports/ coping setting of serious illness: Patient reports he is scheduled for 2 more more chemotherapy treatments, then follow up restaging scan. Pending the results of that scan, he will either discontinue chemo with a prognosis of 6 months or less, or continue chemo as long as cancer is stable or diminished. Patient has strong home family supports with his two daughters living in the home. One of his daughters has a little girl age 3 who's the apple of patients eye. Patience goddaughter also lives in the home with her two-year-old daughter. Bradley [name] receives a lot of joy from these family members. Patient has a strong church family. He's also close to his little miniature pinscher named Smokey who is his constant companion. Patient is followed by [facility]. Skilled nurse visits on a weekly basis.   3. Advanced Care Directives: DNR and MOST form in home, tacked up over his bed. MOST details (12/30/2017): DNR/DNI. Limited scope of medical interventions. Antibiotics and IVFs if indicated. Feeding tube for a defined period of time. Both forms uploaded into [employer] EMR ([employer]).  4. Goals  of Care: Enjoy his family.  5. Follow up Palliative Care Visit: Call to schedule week of Aug 30th.   I spent 60 minutes providing this consultation,  from 10 am to 11 am. More than 50% of the time in this consultation was spent coordinating communication.   HISTORY OF PRESENT ILLNESS:  Bradley [name] is a 78 y.o. male with h/o metastatic lung cancer (diagnosed  12/2017; chemo. L Pleurx catheter 06/28/2018), chronic lower back pain, COPD, CAD (MI), HLD, kidney stones (passed), and HTN. His is a f/u Palliative Care visit from June 12th; ongoing assist with goals of care.  PPS: 50%  HOSPICE ELIGIBILITY/DIAGNOSIS: yes, pending response of disease to nxt 2 month of immunotherapy   PAST MEDICAL HISTORY:  Past Medical History:  Diagnosis Date  . Cancer ([place])    lung  . Chronic lower back pain   . Chronic pain    pain management  . Contact with stonefish as cause of accidental injury 1954   "stung across my left hand in [place]; LUE weaker since"  . COPD (chronic obstructive pulmonary disease) ([place])   . Coronary artery disease   . Dyspnea    with exertion  . High cholesterol   . History of blood transfusion 1943  . History of kidney stones    passed  . Hypertension   . Mass of left lung   . Migraine    "none since the 1990s" (09/11/2016)  . Myocardial infarction ([place]) 01/20/1993; 02/28/1993  . Neuromuscular disorder ([facility])    neuropathy left  . On home O2   . Pneumonia    "several times" (09/11/2016)  . Tobacco abuse    ongoing    SOCIAL HX:  Social History   Tobacco Use  . Smoking status: Current Every Day Smoker    Packs/day: 1.00    Years: 70.00    Pack years: 70.00    Types: Cigarettes  . Smokeless tobacco: Never Used  . Tobacco comment: smokes 8 a day  Substance Use Topics  . Alcohol use: No    Comment: 11/26/2017  "drank from the age of 8 til 03/05/1993"    ALLERGIES:  Allergies  Allergen Reactions  . Aspirin     Allergy to regular Aspirin only       PERTINENT MEDICATIONS:  Outpatient Encounter Medications as of 08/16/2018  Medication Sig  . ADVAIR HFA 115-21 MCG/ACT inhaler Inhale 2 puffs into the lungs 2 (two) times daily.  [name] amLODipine (NORVASC) 5 MG tablet TAKE 1 TABLET BY MOUTH EVERY DAY (Patient taking differently: Take 5 mg by mouth daily. )  . Ascorbic Acid (VITAMIN C) 500 MG CAPS Take 500 mg by mouth daily.  [name]  aspirin EC 81 MG tablet Take 81 mg by mouth daily.   . cyclobenzaprine (FLEXERIL) 5 MG tablet Take 5 mg by mouth 3 (three) times daily.   . Ferrous Sulfate (IRON) 325 (65 Fe) MG TABS Take 1 tablet by mouth daily.  . fluticasone (FLONASE) 50 MCG/ACT nasal spray Place 2 sprays into both nostrils daily.  . furosemide (LASIX) 20 MG tablet Take 1 tablet (20 mg total) by mouth daily.  [name] gabapentin (NEURONTIN) 300 MG capsule Take 1 capsule by mouth 3 (three) times daily.  [name] Ipilimumab (YERVOY IV) Inject into the vein every 6 (six) weeks.  [name] ipratropium-albuterol (DUONEB) 0.5-2.5 (3) MG/3ML SOLN Take 3 mLs by nebulization every 6 (six) hours as needed (for shortness of breath).  . lidocaine-prilocaine (EMLA) cream Apply 1 application topically  as needed. Apply a small amount to port site and cover with plastic wrap 1 hour prior to infusion appointments  . lisinopril (ZESTRIL) 20 MG tablet Take 1 tablet (20 mg total) by mouth daily.  . metoCLOPramide (REGLAN) 5 MG tablet Take 1 tablet by mouth 4 (four) times daily -  before meals and at bedtime.  . metoprolol succinate (TOPROL-XL) 25 MG 24 hr tablet Take 25 mg by mouth daily.  . montelukast (SINGULAIR) 10 MG tablet Take 1 tablet by mouth daily.  . Multiple Vitamins-Minerals (MULTIVITAMIN WITH MINERALS) tablet Take 1 tablet by mouth daily.  [name] MYRBETRIQ 50 MG TB24 tablet Take 1 tablet by mouth daily.  . nitroGLYCERIN (NITROSTAT) 0.4 MG SL tablet Place 0.4 mg under the tongue every 5 (five) minutes as needed for chest pain.  . Nivolumab (OPDIVO IV) Inject into the vein every 14 (fourteen) days.  . Oxycodone HCl 10 MG TABS Take 1 tablet by mouth every 4 (four) hours.   . polyethylene glycol (MIRALAX / GLYCOLAX) 17 g packet Take 17 g by mouth daily as needed for mild constipation.  . potassium gluconate (HM POTASSIUM) 595 (99 K) MG TABS tablet Take 595 mg by mouth daily.  . predniSONE (DELTASONE) 20 MG tablet Take 1 tablet (20 mg total) by mouth daily with  breakfast.  . PRILOSEC 40 MG capsule Take 1 capsule by mouth daily.  . simvastatin (ZOCOR) 10 MG tablet Take 10 mg by mouth at bedtime.   . VENTOLIN HFA 108 (90 Base) MCG/ACT inhaler Inhale 2 puffs into the lungs every 4 (four) hours as needed for wheezing or shortness of breath.   No facility-administered encounter medications on file as of 08/16/2018.     PHYSICAL EXAM:   PE deferred d/t telehealth nature of visit (audio only).  Julianne Handler, NP

## 2018-08-17 DIAGNOSIS — C3490 Malignant neoplasm of unspecified part of unspecified bronchus or lung: Secondary | ICD-10-CM | POA: Diagnosis not present

## 2018-08-17 DIAGNOSIS — Z7982 Long term (current) use of aspirin: Secondary | ICD-10-CM | POA: Diagnosis not present

## 2018-08-17 DIAGNOSIS — I2510 Atherosclerotic heart disease of native coronary artery without angina pectoris: Secondary | ICD-10-CM | POA: Diagnosis not present

## 2018-08-17 DIAGNOSIS — J189 Pneumonia, unspecified organism: Secondary | ICD-10-CM | POA: Diagnosis not present

## 2018-08-17 DIAGNOSIS — M545 Low back pain: Secondary | ICD-10-CM | POA: Diagnosis not present

## 2018-08-17 DIAGNOSIS — J90 Pleural effusion, not elsewhere classified: Secondary | ICD-10-CM | POA: Diagnosis not present

## 2018-08-17 DIAGNOSIS — I10 Essential (primary) hypertension: Secondary | ICD-10-CM | POA: Diagnosis not present

## 2018-08-17 DIAGNOSIS — Z7951 Long term (current) use of inhaled steroids: Secondary | ICD-10-CM | POA: Diagnosis not present

## 2018-08-17 DIAGNOSIS — Z791 Long term (current) use of non-steroidal anti-inflammatories (NSAID): Secondary | ICD-10-CM | POA: Diagnosis not present

## 2018-08-17 DIAGNOSIS — J440 Chronic obstructive pulmonary disease with acute lower respiratory infection: Secondary | ICD-10-CM | POA: Diagnosis not present

## 2018-08-17 DIAGNOSIS — G8929 Other chronic pain: Secondary | ICD-10-CM | POA: Diagnosis not present

## 2018-08-17 DIAGNOSIS — G709 Myoneural disorder, unspecified: Secondary | ICD-10-CM | POA: Diagnosis not present

## 2018-08-17 DIAGNOSIS — C787 Secondary malignant neoplasm of liver and intrahepatic bile duct: Secondary | ICD-10-CM | POA: Diagnosis not present

## 2018-08-17 DIAGNOSIS — Z96641 Presence of right artificial hip joint: Secondary | ICD-10-CM | POA: Diagnosis not present

## 2018-08-17 DIAGNOSIS — Z96652 Presence of left artificial knee joint: Secondary | ICD-10-CM | POA: Diagnosis not present

## 2018-08-23 DIAGNOSIS — M545 Low back pain: Secondary | ICD-10-CM | POA: Diagnosis not present

## 2018-08-23 DIAGNOSIS — G709 Myoneural disorder, unspecified: Secondary | ICD-10-CM | POA: Diagnosis not present

## 2018-08-23 DIAGNOSIS — G8929 Other chronic pain: Secondary | ICD-10-CM | POA: Diagnosis not present

## 2018-08-23 DIAGNOSIS — Z4803 Encounter for change or removal of drains: Secondary | ICD-10-CM | POA: Diagnosis not present

## 2018-08-23 DIAGNOSIS — Z7982 Long term (current) use of aspirin: Secondary | ICD-10-CM | POA: Diagnosis not present

## 2018-08-23 DIAGNOSIS — I2510 Atherosclerotic heart disease of native coronary artery without angina pectoris: Secondary | ICD-10-CM | POA: Diagnosis not present

## 2018-08-23 DIAGNOSIS — J449 Chronic obstructive pulmonary disease, unspecified: Secondary | ICD-10-CM | POA: Diagnosis not present

## 2018-08-23 DIAGNOSIS — J90 Pleural effusion, not elsewhere classified: Secondary | ICD-10-CM | POA: Diagnosis not present

## 2018-08-23 DIAGNOSIS — C787 Secondary malignant neoplasm of liver and intrahepatic bile duct: Secondary | ICD-10-CM | POA: Diagnosis not present

## 2018-08-23 DIAGNOSIS — I10 Essential (primary) hypertension: Secondary | ICD-10-CM | POA: Diagnosis not present

## 2018-08-23 DIAGNOSIS — Z791 Long term (current) use of non-steroidal anti-inflammatories (NSAID): Secondary | ICD-10-CM | POA: Diagnosis not present

## 2018-08-23 DIAGNOSIS — Z96641 Presence of right artificial hip joint: Secondary | ICD-10-CM | POA: Diagnosis not present

## 2018-08-23 DIAGNOSIS — C3490 Malignant neoplasm of unspecified part of unspecified bronchus or lung: Secondary | ICD-10-CM | POA: Diagnosis not present

## 2018-08-23 DIAGNOSIS — Z7951 Long term (current) use of inhaled steroids: Secondary | ICD-10-CM | POA: Diagnosis not present

## 2018-08-23 DIAGNOSIS — Z96652 Presence of left artificial knee joint: Secondary | ICD-10-CM | POA: Diagnosis not present

## 2018-08-24 ENCOUNTER — Telehealth (HOSPITAL_COMMUNITY): Payer: Self-pay | Admitting: Emergency Medicine

## 2018-08-24 ENCOUNTER — Other Ambulatory Visit: Payer: Self-pay

## 2018-08-24 ENCOUNTER — Encounter (HOSPITAL_COMMUNITY): Payer: Self-pay

## 2018-08-24 ENCOUNTER — Inpatient Hospital Stay (HOSPITAL_COMMUNITY): Payer: Medicare Other

## 2018-08-24 ENCOUNTER — Encounter (HOSPITAL_COMMUNITY): Payer: Self-pay | Admitting: Hematology

## 2018-08-24 ENCOUNTER — Inpatient Hospital Stay (HOSPITAL_BASED_OUTPATIENT_CLINIC_OR_DEPARTMENT_OTHER): Payer: Medicare Other | Admitting: Hematology

## 2018-08-24 VITALS — BP 96/55 | HR 84 | Temp 97.5°F | Resp 18 | Wt 152.6 lb

## 2018-08-24 VITALS — BP 112/54 | HR 75 | Temp 98.5°F | Resp 16

## 2018-08-24 DIAGNOSIS — C787 Secondary malignant neoplasm of liver and intrahepatic bile duct: Secondary | ICD-10-CM | POA: Diagnosis not present

## 2018-08-24 DIAGNOSIS — C3492 Malignant neoplasm of unspecified part of left bronchus or lung: Secondary | ICD-10-CM

## 2018-08-24 DIAGNOSIS — Z79899 Other long term (current) drug therapy: Secondary | ICD-10-CM | POA: Diagnosis not present

## 2018-08-24 DIAGNOSIS — Z5112 Encounter for antineoplastic immunotherapy: Secondary | ICD-10-CM | POA: Diagnosis not present

## 2018-08-24 LAB — CBC WITH DIFFERENTIAL/PLATELET
Abs Immature Granulocytes: 0.09 10*3/uL — ABNORMAL HIGH (ref 0.00–0.07)
Basophils Absolute: 0.1 10*3/uL (ref 0.0–0.1)
Basophils Relative: 1 %
Eosinophils Absolute: 0.6 10*3/uL — ABNORMAL HIGH (ref 0.0–0.5)
Eosinophils Relative: 4 %
HCT: 44.3 % (ref 39.0–52.0)
Hemoglobin: 13.7 g/dL (ref 13.0–17.0)
Immature Granulocytes: 1 %
Lymphocytes Relative: 22 %
Lymphs Abs: 3.1 10*3/uL (ref 0.7–4.0)
MCH: 27.9 pg (ref 26.0–34.0)
MCHC: 30.9 g/dL (ref 30.0–36.0)
MCV: 90.2 fL (ref 80.0–100.0)
Monocytes Absolute: 0.8 10*3/uL (ref 0.1–1.0)
Monocytes Relative: 6 %
Neutro Abs: 9.6 10*3/uL — ABNORMAL HIGH (ref 1.7–7.7)
Neutrophils Relative %: 66 %
Platelets: 651 10*3/uL — ABNORMAL HIGH (ref 150–400)
RBC: 4.91 MIL/uL (ref 4.22–5.81)
RDW: 16.9 % — ABNORMAL HIGH (ref 11.5–15.5)
WBC: 14.2 10*3/uL — ABNORMAL HIGH (ref 4.0–10.5)
nRBC: 0 % (ref 0.0–0.2)

## 2018-08-24 LAB — COMPREHENSIVE METABOLIC PANEL
ALT: 11 U/L (ref 0–44)
AST: 16 U/L (ref 15–41)
Albumin: 2.9 g/dL — ABNORMAL LOW (ref 3.5–5.0)
Alkaline Phosphatase: 35 U/L — ABNORMAL LOW (ref 38–126)
Anion gap: 10 (ref 5–15)
BUN: 15 mg/dL (ref 8–23)
CO2: 21 mmol/L — ABNORMAL LOW (ref 22–32)
Calcium: 8.7 mg/dL — ABNORMAL LOW (ref 8.9–10.3)
Chloride: 104 mmol/L (ref 98–111)
Creatinine, Ser: 1.01 mg/dL (ref 0.61–1.24)
GFR calc Af Amer: >60 mL/min (ref >60)
GFR calc non Af Amer: >60 mL/min (ref >60)
Glucose, Bld: 127 mg/dL — ABNORMAL HIGH (ref 70–99)
Potassium: 4.2 mmol/L (ref 3.5–5.1)
Sodium: 135 mmol/L (ref 135–145)
Total Bilirubin: 0.6 mg/dL (ref 0.3–1.2)
Total Protein: 9 g/dL — ABNORMAL HIGH (ref 6.5–8.1)

## 2018-08-24 MED ORDER — SODIUM CHLORIDE 0.9% FLUSH
10.0000 mL | INTRAVENOUS | Status: DC | PRN
  Administered 2018-08-24: 10 mL
  Filled 2018-08-24: qty 10

## 2018-08-24 MED ORDER — SODIUM CHLORIDE 0.9 % IV SOLN
Freq: Once | INTRAVENOUS | Status: AC
  Administered 2018-08-24: 11:00:00 via INTRAVENOUS

## 2018-08-24 MED ORDER — SODIUM CHLORIDE 0.9 % IV SOLN
3.0000 mg/kg | Freq: Once | INTRAVENOUS | Status: AC
  Administered 2018-08-24: 12:00:00 220 mg via INTRAVENOUS
  Filled 2018-08-24: qty 12

## 2018-08-24 MED ORDER — HEPARIN SOD (PORK) LOCK FLUSH 100 UNIT/ML IV SOLN
500.0000 [IU] | Freq: Once | INTRAVENOUS | Status: AC | PRN
  Administered 2018-08-24: 500 [IU]

## 2018-08-24 NOTE — Telephone Encounter (Signed)
Pt called to let us know he has never been on prednisone so no need to cut dose in half. He verbalized understanding.

## 2018-08-24 NOTE — Patient Instructions (Signed)
[facility] at [facility] Discharge Instructions  You were seen today by Dr. Katragadda. He went over your recent lab results. He will see you back in 2 weeks for labs and follow up.   Thank you for choosing [facility] at [facility] to provide your oncology and hematology care.  To afford each patient quality time with our provider, please arrive at least 15 minutes before your scheduled appointment time.   If you have a lab appointment with the Cancer Center please come in thru the  Main Entrance and check in at the main information desk  You need to re-schedule your appointment should you arrive 10 or more minutes late.  We strive to give you quality time with our providers, and arriving late affects you and other patients whose appointments are after yours.  Also, if you no show three or more times for appointments you may be dismissed from the clinic at the providers discretion.     Again, thank you for choosing [facility].  Our hope is that these requests will decrease the amount of time that you wait before being seen by our physicians.       _____________________________________________________________  Should you have questions after your visit to [facility], please contact our office at [phone] between the hours of 8:00 a.m. and 4:30 p.m.  Voicemails left after 4:00 p.m. will not be returned until the following business day.  For prescription refill requests, have your pharmacy contact our office and allow 72 hours.    Cancer Center Support Programs:   > Cancer Support Group  2nd Tuesday of the month 1pm-2pm, Journey Room    

## 2018-08-24 NOTE — Patient Instructions (Signed)
[facility] Discharge Instructions for Patients Receiving Chemotherapy  Today you received the following chemotherapy agents   To help prevent nausea and vomiting after your treatment, we encourage you to take your nausea medication   If you develop nausea and vomiting that is not controlled by your nausea medication, call the clinic.   BELOW ARE SYMPTOMS THAT SHOULD BE REPORTED IMMEDIATELY:  *FEVER GREATER THAN 100.5 F  *CHILLS WITH OR WITHOUT FEVER  NAUSEA AND VOMITING THAT IS NOT CONTROLLED WITH YOUR NAUSEA MEDICATION  *UNUSUAL SHORTNESS OF BREATH  *UNUSUAL BRUISING OR BLEEDING  TENDERNESS IN MOUTH AND THROAT WITH OR WITHOUT PRESENCE OF ULCERS  *URINARY PROBLEMS  *BOWEL PROBLEMS  UNUSUAL RASH Items with * indicate a potential emergency and should be followed up as soon as possible.  Feel free to call the clinic should you have any questions or concerns. The clinic phone number is [phone].  Please show the CHEMO ALERT CARD at check-in to the Emergency Department and triage nurse.   

## 2018-08-24 NOTE — Progress Notes (Signed)
Labs reviewed with MD today at office visit. Proceed as planned per MD.   Bradley [name] a medication list to Patient and wrote down instructions to decrease does of prednisone to 10 mg per MD.   Treatment given per orders. Patient tolerated it well without problems. Vitals stable and discharged home from clinic via wheelchair. Follow up as scheduled.

## 2018-08-24 NOTE — Progress Notes (Addendum)
Bradley [name], [place] [id number]   CLINIC:  Medical Oncology/Hematology  PCP:  Jani Gravel, MD [place] [address] [phone]   REASON FOR VISIT:  Follow-up for  Non-small cell lung cancer   BRIEF ONCOLOGIC HISTORY:  Oncology History  Non-small cell carcinoma of left lung ([facility])  12/29/2017 Initial Diagnosis   Non-small cell lung cancer ([facility])   04/26/2018 -  Chemotherapy   The patient had ipilimumab (YERVOY) 75 mg in sodium chloride 0.9 % 50 mL chemo infusion, 1 mg/kg = 75 mg (100 % of original dose 1 mg/kg), Intravenous,  Once, 3 of 3 cycles Dose modification: 1 mg/kg (original dose 1 mg/kg, Cycle 1, Reason: Other (see comments), Comment: per trial) Administration: 75 mg (04/26/2018), 75 mg (06/08/2018), 75 mg (08/10/2018) nivolumab (OPDIVO) 220 mg in sodium chloride 0.9 % 100 mL chemo infusion, 3 mg/kg = 220 mg (100 % of original dose 3 mg/kg), Intravenous, Once, 8 of 9 cycles Dose modification: 3 mg/kg (original dose 3 mg/kg, Cycle 1, Reason: Other (see comments), Comment: Per trial) Administration: 220 mg (04/26/2018), 220 mg (06/08/2018), 220 mg (05/10/2018), 220 mg (08/10/2018), 220 mg (08/24/2018), 220 mg (05/24/2018), 220 mg (07/12/2018), 220 mg (07/26/2018)  for chemotherapy treatment.       CANCER STAGING: Cancer Staging No matching staging information was found for the patient.   INTERVAL HISTORY:  Bradley [name] 78 y.o. male seen for follow-up of metastatic lung cancer.  His appetite is 100%.  Energy levels are 25%.  He received last immunotherapy 2 weeks ago.  He had diarrhea today after eating beans for the last 3 days.  Cough and shortness of breath on exertion is stable.  Denies any fevers, night sweats or weight loss.  Appetite is 100%.  Energy levels are 25%.  No recent hospitalizations.  Left-sided chest wall pain is very well controlled on the current pain regimen.    REVIEW OF SYSTEMS:  Review of  Systems  Respiratory: Positive for shortness of breath. Negative for cough.   Cardiovascular: Positive for chest pain.  Neurological: Negative for numbness.  All other systems reviewed and are negative.    PAST MEDICAL/SURGICAL HISTORY:  Past Medical History:  Diagnosis Date  . Cancer ([facility])    lung  . Chronic lower back pain   . Chronic pain    pain management  . Contact with stonefish as cause of accidental injury 1954   "stung across my left hand in [place]; LUE weaker since"  . COPD (chronic obstructive pulmonary disease) ([facility])   . Coronary artery disease   . Dyspnea    with exertion  . High cholesterol   . History of blood transfusion 1943  . History of kidney stones    passed  . Hypertension   . Mass of left lung   . Migraine    "none since the 1990s" (09/11/2016)  . Myocardial infarction ([facility]) 01/20/1993; 02/28/1993  . Neuromuscular disorder ([facility])    neuropathy left  . On home O2   . Pneumonia    "several times" (09/11/2016)  . Tobacco abuse    ongoing   Past Surgical History:  Procedure Laterality Date  . APPENDECTOMY    . CARDIAC CATHETERIZATION    . CATARACT EXTRACTION, BILATERAL Bilateral   . CHEST TUBE INSERTION Left 06/28/2018   Procedure: INSERTION PLEURAL DRAINAGE CATHETER;  Surgeon: Ivin Poot, MD;  Location: [facility];  Service: Thoracic;  Laterality: Left;  .  COLONOSCOPY    . HIP ARTHROPLASTY Right 12/20/2016   Procedure: ARTHROPLASTY BIPOLAR HIP (HEMIARTHROPLASTY);  Surgeon: [name], MD;  Location: [facility];  Service: Orthopedics;  Laterality: Right;  . IR THORACENTESIS ASP PLEURAL SPACE W/IMG GUIDE  06/15/2018  . JOINT REPLACEMENT    . LEFT HEART CATH AND CORONARY ANGIOGRAPHY N/A 09/12/2016   Procedure: LEFT HEART CATH AND CORONARY ANGIOGRAPHY;  Surgeon: Burnell Blanks, MD;  Location: [facility];  Service: Cardiovascular;  Laterality: N/A;  . NASAL ENDOSCOPY WITH EPISTAXIS CONTROL Right 03/15/2016   Procedure: NASAL ENDOSCOPY WITH  EPISTAXIS CONTROL;  Surgeon: Jodi Marble, MD;  Location: [facility];  Service: ENT;  Laterality: Right;  . PORTACATH PLACEMENT Right 04/23/2018   Procedure: INSERTION PORT-A-CATH;  Surgeon: Aviva Signs, MD;  [facility];  Service: General;  Laterality: Right;  . SEPTOPLASTY Right 03/15/2016   Procedure: SEPTOPLASTY;  Surgeon: Jodi Marble, MD;  Location: [facility];  Service: ENT;  Laterality: Right;  . SINUS EXPLORATION Right 03/15/2016   Procedure: SINUS EXPLORATION;  Surgeon: Jodi Marble, MD;  Location: [facility];  Service: ENT;  Laterality: Right;  . TONSILLECTOMY    . TOOTH EXTRACTION    . TOTAL KNEE ARTHROPLASTY Left   . VIDEO BRONCHOSCOPY WITH ENDOBRONCHIAL ULTRASOUND N/A 12/15/2017   Procedure: VIDEO BRONCHOSCOPY WITH ENDOBRONCHIAL ULTRASOUND;  Surgeon: Rigoberto Noel, MD;  Location: [facility];  Service: Thoracic;  Laterality: N/A;     SOCIAL HISTORY:  Social History   Socioeconomic History  . Marital status: Widowed    Spouse name: Not on file  . Number of children: Not on file  . Years of education: Not on file  . Highest education level: Not on file  Occupational History  . Not on file  Social Needs  . Financial resource strain: Patient refused  . Food insecurity    Worry: Patient refused    Inability: Patient refused  . Transportation needs    Medical: Patient refused    Non-medical: Patient refused  Tobacco Use  . Smoking status: Current Every Day Smoker    Packs/day: 1.00    Years: 70.00    Pack years: 70.00    Types: Cigarettes  . Smokeless tobacco: Never Used  . Tobacco comment: smokes 8 a day  Substance and Sexual Activity  . Alcohol use: No    Comment: 11/26/2017  "drank from the age of 8 til 03/05/1993"  . Drug use: Not Currently    Comment: 11/26/2017 "used some drugs when I was young"  . Sexual activity: Not Currently  Lifestyle  . Physical activity    Days per week: Patient refused    Minutes per session: Patient refused  . Stress: To some extent   Relationships  . Social Herbalist on phone: Patient refused    Gets together: Patient refused    Attends religious service: Patient refused    Active member of club or organization: Patient refused    Attends meetings of clubs or organizations: Patient refused    Relationship status: Patient refused  . Intimate partner violence    Fear of current or ex partner: Patient refused    Emotionally abused: Patient refused    Physically abused: Patient refused    Forced sexual activity: Patient refused  Other Topics Concern  . Not on file  Social History Narrative  . Not on file    FAMILY HISTORY:  Family History  Adopted: Yes  Problem Relation Age of Onset  .  Cancer Mother   . Obesity Father     CURRENT MEDICATIONS:  Outpatient Encounter Medications as of 08/24/2018  Medication Sig Note  . ADVAIR HFA 115-21 MCG/ACT inhaler Inhale 2 puffs into the lungs 2 (two) times daily.   [name] amLODipine (NORVASC) 5 MG tablet TAKE 1 TABLET BY MOUTH EVERY DAY (Patient taking differently: Take 5 mg by mouth daily. )   . Ascorbic Acid (VITAMIN C) 500 MG CAPS Take 500 mg by mouth daily.   [name] aspirin EC 81 MG tablet Take 81 mg by mouth daily.    . cyclobenzaprine (FLEXERIL) 5 MG tablet Take 5 mg by mouth 3 (three) times daily.    . Ferrous Sulfate (IRON) 325 (65 Fe) MG TABS Take 1 tablet by mouth daily.   . fluticasone (FLONASE) 50 MCG/ACT nasal spray Place 2 sprays into both nostrils daily.   . furosemide (LASIX) 20 MG tablet Take 1 tablet (20 mg total) by mouth daily.   [name] gabapentin (NEURONTIN) 300 MG capsule Take 1 capsule by mouth 3 (three) times daily.   [name] Ipilimumab (YERVOY IV) Inject into the vein every 6 (six) weeks.   [name] ipratropium-albuterol (DUONEB) 0.5-2.5 (3) MG/3ML SOLN Take 3 mLs by nebulization every 6 (six) hours as needed (for shortness of breath).   . lidocaine-prilocaine (EMLA) cream Apply 1 application topically as needed. Apply a small amount to port site and cover with  plastic wrap 1 hour prior to infusion appointments   . lisinopril (ZESTRIL) 20 MG tablet Take 1 tablet (20 mg total) by mouth daily.   . metoCLOPramide (REGLAN) 5 MG tablet Take 1 tablet by mouth 4 (four) times daily -  before meals and at bedtime.   . metoprolol succinate (TOPROL-XL) 25 MG 24 hr tablet Take 25 mg by mouth daily.   . montelukast (SINGULAIR) 10 MG tablet Take 1 tablet by mouth daily.   . Multiple Vitamins-Minerals (MULTIVITAMIN WITH MINERALS) tablet Take 1 tablet by mouth daily.   [name] MYRBETRIQ 50 MG TB24 tablet Take 1 tablet by mouth daily.   . nitroGLYCERIN (NITROSTAT) 0.4 MG SL tablet Place 0.4 mg under the tongue every 5 (five) minutes as needed for chest pain.   . Nivolumab (OPDIVO IV) Inject into the vein every 14 (fourteen) days. 08/04/2018: Due for treatment on 08/10/2018  . Oxycodone HCl 10 MG TABS Take 1 tablet by mouth every 4 (four) hours.    . polyethylene glycol (MIRALAX / GLYCOLAX) 17 g packet Take 17 g by mouth daily as needed for mild constipation.   . potassium gluconate (HM POTASSIUM) 595 (99 K) MG TABS tablet Take 595 mg by mouth daily.   . predniSONE (DELTASONE) 20 MG tablet Take 1 tablet (20 mg total) by mouth daily with breakfast.   . PRILOSEC 40 MG capsule Take 1 capsule by mouth daily.   . simvastatin (ZOCOR) 10 MG tablet Take 10 mg by mouth at bedtime.    . VENTOLIN HFA 108 (90 Base) MCG/ACT inhaler Inhale 2 puffs into the lungs every 4 (four) hours as needed for wheezing or shortness of breath.    No facility-administered encounter medications on file as of 08/24/2018.     ALLERGIES:  Allergies  Allergen Reactions  . Aspirin     Allergy to regular Aspirin only       PHYSICAL EXAM:  ECOG Performance status: 1  Vitals:   08/24/18 0936  BP: (!) 96/55  Pulse: 84  Resp: 18  Temp: (!) 97.5 F (36.4 C)  SpO2: 98%   Filed Weights   08/24/18 0936  Weight: 152 lb 9.6 oz (69.2 kg)    Physical Exam Vitals signs reviewed.  Constitutional:       Appearance: Normal appearance.  Cardiovascular:     Rate and Rhythm: Normal rate and regular rhythm.     Heart sounds: Normal heart sounds.  Pulmonary:     Effort: Pulmonary effort is normal.     Comments: Decreased breath sounds at the left lung base. Abdominal:     General: There is no distension.     Palpations: Abdomen is soft. There is no mass.  Musculoskeletal:        General: No swelling.  Skin:    General: Skin is warm.  Neurological:     General: No focal deficit present.     Mental Status: He is alert and oriented to person, place, and time.  Psychiatric:        Mood and Affect: Mood normal.        Behavior: Behavior normal.      LABORATORY DATA:  I have reviewed the labs as listed.  CBC    Component Value Date/Time   WBC 14.2 (H) 08/24/2018 0904   RBC 4.91 08/24/2018 0904   HGB 13.7 08/24/2018 0904   HCT 44.3 08/24/2018 0904   PLT 651 (H) 08/24/2018 0904   MCV 90.2 08/24/2018 0904   MCH 27.9 08/24/2018 0904   MCHC 30.9 08/24/2018 0904   RDW 16.9 (H) 08/24/2018 0904   LYMPHSABS 3.1 08/24/2018 0904   MONOABS 0.8 08/24/2018 0904   EOSABS 0.6 (H) 08/24/2018 0904   BASOSABS 0.1 08/24/2018 0904   CMP Latest Ref Rng & Units 08/24/2018 08/10/2018 08/03/2018  Glucose 70 - 99 mg/dL 127(H) 120(H) 146(H)  BUN 8 - 23 mg/dL _0 Creatinine 0.61 - 1.24 mg/dL 1.01 0.99 1.07  Sodium 135 - 145 mmol/L 135 136 137  Potassium 3.5 - 5.1 mmol/L 4.2 3.2(L) 3.7  Chloride 98 - 111 mmol/L 104 102 101  CO2 22 - 32 mmol/L 21(L) 23 24  Calcium 8.9 - 10.3 mg/dL 8.7(L) 8.5(L) 8.6(L)  Total Protein 6.5 - 8.1 g/dL 9.0(H) 8.5(H) 9.2(H)  Total Bilirubin 0.3 - 1.2 mg/dL 0.6 0.8 0.8  Alkaline Phos 38 - 126 U/L 35(L) 32(L) 39  AST 15 - 41 U/L _1 ALT 0 - 44 U/L _2 DIAGNOSTIC IMAGING:  I have independently reviewed the scans and discussed with the patient.     ASSESSMENT & PLAN:   Non-small cell carcinoma of left lung ([facility]) 1.  Non-small cell lung cancer,  adenocarcinoma: - Foundation 1 testing shows MS-stable, TMB 20 muts/Mb, BRAF amplification, MET amplification, MYC and KIT amplification, PDGFRA/SOX2 amplifications.  PD-L1 could not be done because of scant tissue. - Biopsy of the liver lesion on 03/17/2018 for molecular study showed poorly differentiated carcinoma. -Combination immunotherapy with ipilimumab and nivolumab cycle 1 on 04/26/2018 and cycle 2 on 06/08/2018. - He had multiple hospitalizations after start of immunotherapy. - CT of the chest on 06/13/2018 showed new anterior mediastinal mass suspicious for recurrent tumor.  New liver lesions.  This was compared from PET scan on February 15, 2018.  He did not start treatments for 2 months after that scan.  We did not have a baseline scan. -Left Pleurx catheter placed on 06/28/2018.  It is draining very minimal amount. - His breathing is stable at this time.  He  denies any immunotherapy related side effects. -I have reviewed his blood work.  He may proceed with his next cycle of Opdivo today. -I will arrange for a CT CAP prior to next visit in 2 weeks.   2.  Left chest wall pain: -This has improved since the Pleurx catheter placement. -He is taking oxycodone 10 mg every 4 hours as needed.   Total time spent is 25 minutes with more than 50% of the time spent face-to-face discussing treatment plan, pain management and coordination of care.  Orders placed this encounter:  Orders Placed This Encounter  Procedures  . CT Abdomen Pelvis W Contrast  . CT Chest W Contrast      Derek Jack, MD [place] [phone]

## 2018-08-24 NOTE — Assessment & Plan Note (Signed)
1.  Non-small cell lung cancer, adenocarcinoma: - Foundation 1 testing shows MS-stable, TMB 20 muts/Mb, BRAF amplification, MET amplification, MYC and KIT amplification, PDGFRA/SOX2 amplifications.  PD-L1 could not be done because of scant tissue. - Biopsy of the liver lesion on 03/17/2018 for molecular study showed poorly differentiated carcinoma. -Combination immunotherapy with ipilimumab and nivolumab cycle 1 on 04/26/2018 and cycle 2 on 06/08/2018. - He had multiple hospitalizations after start of immunotherapy. - CT of the chest on 06/13/2018 showed new anterior mediastinal mass suspicious for recurrent tumor.  New liver lesions.  This was compared from PET scan on February 15, 2018.  He did not start treatments for 2 months after that scan.  We did not have a baseline scan. -Left Pleurx catheter placed on 06/28/2018.  It is draining very minimal amount. - His breathing is stable at this time.  He denies any immunotherapy related side effects. -I have reviewed his blood work.  He may proceed with his next cycle of Opdivo today. -I will arrange for a CT CAP prior to next visit in 2 weeks.   2.  Left chest wall pain: -This has improved since the Pleurx catheter placement. -He is taking oxycodone 10 mg every 4 hours as needed.

## 2018-08-24 NOTE — Progress Notes (Signed)
08/24/18  Prednisone dose at home dose of 20 mg per day lowered today to 10 mg per day.  RN to instruct patient on lower dose to take at home.  T.O. Dr Beckey Downing LPN/Wavie Hashimi Ronnald Ramp, PharmD

## 2018-08-25 ENCOUNTER — Ambulatory Visit (INDEPENDENT_AMBULATORY_CARE_PROVIDER_SITE_OTHER): Payer: Medicare Other | Admitting: Cardiothoracic Surgery

## 2018-08-25 ENCOUNTER — Encounter: Payer: Self-pay | Admitting: Cardiothoracic Surgery

## 2018-08-25 ENCOUNTER — Ambulatory Visit
Admission: RE | Admit: 2018-08-25 | Discharge: 2018-08-25 | Disposition: A | Discharge status: Home / Self Care | Payer: Medicare Other | Source: Ambulatory Visit | Attending: Cardiothoracic Surgery | Admitting: Cardiothoracic Surgery

## 2018-08-25 VITALS — BP 107/65 | HR 90 | Temp 97.9°F | Resp 20 | Ht 72.0 in | Wt 150.0 lb

## 2018-08-25 DIAGNOSIS — C3492 Malignant neoplasm of unspecified part of left bronchus or lung: Secondary | ICD-10-CM

## 2018-08-25 DIAGNOSIS — J90 Pleural effusion, not elsewhere classified: Secondary | ICD-10-CM | POA: Diagnosis not present

## 2018-08-25 NOTE — Progress Notes (Signed)
PCP is Jani Gravel, MD Referring Provider is Elodia Florence.,*  Chief Complaint  Patient presents with  . Pleural Effusion    1 month f/u with CXR    HPI: Patient returns for left Pleurx catheter follow-up. He had a malignant pleural effusion. The effusion has dried up with chemotherapy. Chest x-ray shows no significant effusion today Pleurx catheter was removed in the office today. 2 nylon sutures at the exit site need to be removed in 1 week by home health nursing.   Past Medical History:  Diagnosis Date  . Cancer ([facility])    lung  . Chronic lower back pain   . Chronic pain    pain management  . Contact with stonefish as cause of accidental injury 1954   "stung across my left hand in [place]; LUE weaker since"  . COPD (chronic obstructive pulmonary disease) ([facility])   . Coronary artery disease   . Dyspnea    with exertion  . High cholesterol   . History of blood transfusion 1943  . History of kidney stones    passed  . Hypertension   . Mass of left lung   . Migraine    "none since the 1990s" (09/11/2016)  . Myocardial infarction ([facility]) 01/20/1993; 02/28/1993  . Neuromuscular disorder ([facility])    neuropathy left  . On home O2   . Pneumonia    "several times" (09/11/2016)  . Tobacco abuse    ongoing    Past Surgical History:  Procedure Laterality Date  . APPENDECTOMY    . CARDIAC CATHETERIZATION    . CATARACT EXTRACTION, BILATERAL Bilateral   . CHEST TUBE INSERTION Left 06/28/2018   Procedure: INSERTION PLEURAL DRAINAGE CATHETER;  Surgeon: Ivin Poot, MD;  Location: [facility];  Service: Thoracic;  Laterality: Left;  . COLONOSCOPY    . HIP ARTHROPLASTY Right 12/20/2016   Procedure: ARTHROPLASTY BIPOLAR HIP (HEMIARTHROPLASTY);  Surgeon: [name], MD;  Location: [facility];  Service: Orthopedics;  Laterality: Right;  . IR THORACENTESIS ASP PLEURAL SPACE W/IMG GUIDE  06/15/2018  . JOINT REPLACEMENT    . LEFT HEART CATH AND CORONARY ANGIOGRAPHY N/A 09/12/2016   Procedure:  LEFT HEART CATH AND CORONARY ANGIOGRAPHY;  Surgeon: Burnell Blanks, MD;  Location: [facility];  Service: Cardiovascular;  Laterality: N/A;  . NASAL ENDOSCOPY WITH EPISTAXIS CONTROL Right 03/15/2016   Procedure: NASAL ENDOSCOPY WITH EPISTAXIS CONTROL;  Surgeon: Jodi Marble, MD;  Location: [facility];  Service: ENT;  Laterality: Right;  . PORTACATH PLACEMENT Right 04/23/2018   Procedure: INSERTION PORT-A-CATH;  Surgeon: Aviva Signs, MD;  [facility];  Service: General;  Laterality: Right;  . SEPTOPLASTY Right 03/15/2016   Procedure: SEPTOPLASTY;  Surgeon: Jodi Marble, MD;  Location: [facility];  Service: ENT;  Laterality: Right;  . SINUS EXPLORATION Right 03/15/2016   Procedure: SINUS EXPLORATION;  Surgeon: Jodi Marble, MD;  Location: [facility];  Service: ENT;  Laterality: Right;  . TONSILLECTOMY    . TOOTH EXTRACTION    . TOTAL KNEE ARTHROPLASTY Left   . VIDEO BRONCHOSCOPY WITH ENDOBRONCHIAL ULTRASOUND N/A 12/15/2017   Procedure: VIDEO BRONCHOSCOPY WITH ENDOBRONCHIAL ULTRASOUND;  Surgeon: Rigoberto Noel, MD;  Location: [facility];  Service: Thoracic;  Laterality: N/A;    Family History  Adopted: Yes  Problem Relation Age of Onset  . Cancer Mother   . Obesity Father     Social History Social History   Tobacco Use  . Smoking status: Current Every Day Smoker  Packs/day: 1.00    Years: 70.00    Pack years: 70.00    Types: Cigarettes  . Smokeless tobacco: Never Used  . Tobacco comment: smokes 8 a day  Substance Use Topics  . Alcohol use: No    Comment: 11/26/2017  "drank from the age of 8 til 03/05/1993"  . Drug use: Not Currently    Comment: 11/26/2017 "used some drugs when I was young"    Current Outpatient Medications  Medication Sig Dispense Refill  . ADVAIR HFA 115-21 MCG/ACT inhaler Inhale 2 puffs into the lungs 2 (two) times daily. 1 Inhaler 4  . amLODipine (NORVASC) 5 MG tablet TAKE 1 TABLET BY MOUTH EVERY DAY (Patient taking differently: Take 5 mg by mouth daily.  ) 90 tablet 1  . Ascorbic Acid (VITAMIN C) 500 MG CAPS Take 500 mg by mouth daily.    [name] aspirin EC 81 MG tablet Take 81 mg by mouth daily.     . cyclobenzaprine (FLEXERIL) 5 MG tablet Take 5 mg by mouth 3 (three) times daily.     . Ferrous Sulfate (IRON) 325 (65 Fe) MG TABS Take 1 tablet by mouth daily.    . fluticasone (FLONASE) 50 MCG/ACT nasal spray Place 2 sprays into both nostrils daily.  6  . furosemide (LASIX) 20 MG tablet Take 1 tablet (20 mg total) by mouth daily. 10 tablet 0  . gabapentin (NEURONTIN) 300 MG capsule Take 1 capsule by mouth 3 (three) times daily.  2  . Ipilimumab (YERVOY IV) Inject into the vein every 6 (six) weeks.    [name] ipratropium-albuterol (DUONEB) 0.5-2.5 (3) MG/3ML SOLN Take 3 mLs by nebulization every 6 (six) hours as needed (for shortness of breath).    . lidocaine-prilocaine (EMLA) cream Apply 1 application topically as needed. Apply a small amount to port site and cover with plastic wrap 1 hour prior to infusion appointments 30 g 3  . lisinopril (ZESTRIL) 20 MG tablet Take 1 tablet (20 mg total) by mouth daily.    . metoCLOPramide (REGLAN) 5 MG tablet Take 1 tablet by mouth 4 (four) times daily -  before meals and at bedtime.  5  . metoprolol succinate (TOPROL-XL) 25 MG 24 hr tablet Take 25 mg by mouth daily.    . montelukast (SINGULAIR) 10 MG tablet Take 1 tablet by mouth daily.    . Multiple Vitamins-Minerals (MULTIVITAMIN WITH MINERALS) tablet Take 1 tablet by mouth daily.    [name] MYRBETRIQ 50 MG TB24 tablet Take 1 tablet by mouth daily.    . nitroGLYCERIN (NITROSTAT) 0.4 MG SL tablet Place 0.4 mg under the tongue every 5 (five) minutes as needed for chest pain.    . Nivolumab (OPDIVO IV) Inject into the vein every 14 (fourteen) days.    . Oxycodone HCl 10 MG TABS Take 1 tablet by mouth every 4 (four) hours.     . polyethylene glycol (MIRALAX / GLYCOLAX) 17 g packet Take 17 g by mouth daily as needed for mild constipation. 14 each 0  . potassium gluconate (HM  POTASSIUM) 595 (99 K) MG TABS tablet Take 595 mg by mouth daily.    . predniSONE (DELTASONE) 20 MG tablet Take 1 tablet (20 mg total) by mouth daily with breakfast. 5 tablet 0  . PRILOSEC 40 MG capsule Take 1 capsule by mouth daily.    . simvastatin (ZOCOR) 10 MG tablet Take 10 mg by mouth at bedtime.   3  . VENTOLIN HFA 108 (90 Base) MCG/ACT inhaler  Inhale 2 puffs into the lungs every 4 (four) hours as needed for wheezing or shortness of breath. 18 g 5   No current facility-administered medications for this visit.     Allergies  Allergen Reactions  . Aspirin     Allergy to regular Aspirin only      Review of Systems  Patient has chemotherapy scheduled for tomorrow  BP 107/65   Pulse 90   Temp 97.9 F (36.6 C) (Skin)   Resp 20   Ht 6' (1.829 m)   Wt 150 lb (68 kg)   BMI 20.34 kg/m  Physical Exam Breath sounds with coarse breath sounds but equal bilaterally Neuro intact O2 saturation on room air 94%  Diagnostic Tests:  Chest x-ray image today personally reviewed showing COPD, chronic interstitial markings but no significant pleural effusion   impression: Resolution of malignant left pleural effusion.  Pleurx catheter has been removed in the office. Plan: Needed.  Patient states he will asked the home health nurse to remove his sutures in 1 week. Return in [place] [facility] [phone]

## 2018-08-30 DIAGNOSIS — Z4803 Encounter for change or removal of drains: Secondary | ICD-10-CM | POA: Diagnosis not present

## 2018-08-30 DIAGNOSIS — J449 Chronic obstructive pulmonary disease, unspecified: Secondary | ICD-10-CM | POA: Diagnosis not present

## 2018-08-30 DIAGNOSIS — J90 Pleural effusion, not elsewhere classified: Secondary | ICD-10-CM | POA: Diagnosis not present

## 2018-08-30 DIAGNOSIS — I2510 Atherosclerotic heart disease of native coronary artery without angina pectoris: Secondary | ICD-10-CM | POA: Diagnosis not present

## 2018-08-30 DIAGNOSIS — I10 Essential (primary) hypertension: Secondary | ICD-10-CM | POA: Diagnosis not present

## 2018-08-30 DIAGNOSIS — Z96641 Presence of right artificial hip joint: Secondary | ICD-10-CM | POA: Diagnosis not present

## 2018-08-30 DIAGNOSIS — Z96652 Presence of left artificial knee joint: Secondary | ICD-10-CM | POA: Diagnosis not present

## 2018-08-30 DIAGNOSIS — C3490 Malignant neoplasm of unspecified part of unspecified bronchus or lung: Secondary | ICD-10-CM | POA: Diagnosis not present

## 2018-08-30 DIAGNOSIS — M545 Low back pain: Secondary | ICD-10-CM | POA: Diagnosis not present

## 2018-08-30 DIAGNOSIS — C787 Secondary malignant neoplasm of liver and intrahepatic bile duct: Secondary | ICD-10-CM | POA: Diagnosis not present

## 2018-08-30 DIAGNOSIS — Z791 Long term (current) use of non-steroidal anti-inflammatories (NSAID): Secondary | ICD-10-CM | POA: Diagnosis not present

## 2018-08-30 DIAGNOSIS — Z7982 Long term (current) use of aspirin: Secondary | ICD-10-CM | POA: Diagnosis not present

## 2018-08-30 DIAGNOSIS — G709 Myoneural disorder, unspecified: Secondary | ICD-10-CM | POA: Diagnosis not present

## 2018-08-30 DIAGNOSIS — G8929 Other chronic pain: Secondary | ICD-10-CM | POA: Diagnosis not present

## 2018-08-30 DIAGNOSIS — Z7951 Long term (current) use of inhaled steroids: Secondary | ICD-10-CM | POA: Diagnosis not present

## 2018-08-31 ENCOUNTER — Encounter (HOSPITAL_COMMUNITY): Payer: Self-pay | Admitting: *Deleted

## 2018-08-31 ENCOUNTER — Emergency Department (HOSPITAL_COMMUNITY): Payer: Medicare Other

## 2018-08-31 ENCOUNTER — Other Ambulatory Visit: Payer: Self-pay

## 2018-08-31 ENCOUNTER — Inpatient Hospital Stay (HOSPITAL_COMMUNITY)
Admission: EM | Admit: 2018-08-31 | Discharge: 2018-09-09 | DRG: 871 | Disposition: A | Discharge status: Hospice | Payer: Medicare Other | Attending: Internal Medicine | Admitting: Internal Medicine

## 2018-08-31 DIAGNOSIS — A419 Sepsis, unspecified organism: Secondary | ICD-10-CM | POA: Diagnosis not present

## 2018-08-31 DIAGNOSIS — J441 Chronic obstructive pulmonary disease with (acute) exacerbation: Secondary | ICD-10-CM | POA: Diagnosis present

## 2018-08-31 DIAGNOSIS — Z720 Tobacco use: Secondary | ICD-10-CM | POA: Diagnosis not present

## 2018-08-31 DIAGNOSIS — C3492 Malignant neoplasm of unspecified part of left bronchus or lung: Secondary | ICD-10-CM | POA: Diagnosis not present

## 2018-08-31 DIAGNOSIS — I129 Hypertensive chronic kidney disease with stage 1 through stage 4 chronic kidney disease, or unspecified chronic kidney disease: Secondary | ICD-10-CM | POA: Diagnosis not present

## 2018-08-31 DIAGNOSIS — I10 Essential (primary) hypertension: Secondary | ICD-10-CM | POA: Diagnosis present

## 2018-08-31 DIAGNOSIS — Z515 Encounter for palliative care: Secondary | ICD-10-CM | POA: Diagnosis present

## 2018-08-31 DIAGNOSIS — Z7982 Long term (current) use of aspirin: Secondary | ICD-10-CM

## 2018-08-31 DIAGNOSIS — Z96652 Presence of left artificial knee joint: Secondary | ICD-10-CM | POA: Diagnosis present

## 2018-08-31 DIAGNOSIS — R918 Other nonspecific abnormal finding of lung field: Secondary | ICD-10-CM | POA: Diagnosis not present

## 2018-08-31 DIAGNOSIS — J449 Chronic obstructive pulmonary disease, unspecified: Secondary | ICD-10-CM | POA: Diagnosis not present

## 2018-08-31 DIAGNOSIS — J439 Emphysema, unspecified: Secondary | ICD-10-CM | POA: Diagnosis not present

## 2018-08-31 DIAGNOSIS — E785 Hyperlipidemia, unspecified: Secondary | ICD-10-CM | POA: Diagnosis present

## 2018-08-31 DIAGNOSIS — J910 Malignant pleural effusion: Secondary | ICD-10-CM | POA: Diagnosis not present

## 2018-08-31 DIAGNOSIS — Z96641 Presence of right artificial hip joint: Secondary | ICD-10-CM | POA: Diagnosis present

## 2018-08-31 DIAGNOSIS — R0600 Dyspnea, unspecified: Secondary | ICD-10-CM

## 2018-08-31 DIAGNOSIS — Z7952 Long term (current) use of systemic steroids: Secondary | ICD-10-CM

## 2018-08-31 DIAGNOSIS — Y95 Nosocomial condition: Secondary | ICD-10-CM | POA: Diagnosis present

## 2018-08-31 DIAGNOSIS — R0602 Shortness of breath: Secondary | ICD-10-CM | POA: Diagnosis not present

## 2018-08-31 DIAGNOSIS — Z8701 Personal history of pneumonia (recurrent): Secondary | ICD-10-CM

## 2018-08-31 DIAGNOSIS — J9621 Acute and chronic respiratory failure with hypoxia: Secondary | ICD-10-CM | POA: Diagnosis not present

## 2018-08-31 DIAGNOSIS — G8929 Other chronic pain: Secondary | ICD-10-CM | POA: Diagnosis not present

## 2018-08-31 DIAGNOSIS — R0902 Hypoxemia: Secondary | ICD-10-CM

## 2018-08-31 DIAGNOSIS — Z66 Do not resuscitate: Secondary | ICD-10-CM | POA: Diagnosis present

## 2018-08-31 DIAGNOSIS — R739 Hyperglycemia, unspecified: Secondary | ICD-10-CM | POA: Diagnosis not present

## 2018-08-31 DIAGNOSIS — N183 Chronic kidney disease, stage 3 (moderate): Secondary | ICD-10-CM | POA: Diagnosis not present

## 2018-08-31 DIAGNOSIS — I252 Old myocardial infarction: Secondary | ICD-10-CM | POA: Diagnosis not present

## 2018-08-31 DIAGNOSIS — Z9981 Dependence on supplemental oxygen: Secondary | ICD-10-CM

## 2018-08-31 DIAGNOSIS — E871 Hypo-osmolality and hyponatremia: Secondary | ICD-10-CM | POA: Diagnosis present

## 2018-08-31 DIAGNOSIS — Z20828 Contact with and (suspected) exposure to other viral communicable diseases: Secondary | ICD-10-CM | POA: Diagnosis present

## 2018-08-31 DIAGNOSIS — R9389 Abnormal findings on diagnostic imaging of other specified body structures: Secondary | ICD-10-CM | POA: Diagnosis present

## 2018-08-31 DIAGNOSIS — F17210 Nicotine dependence, cigarettes, uncomplicated: Secondary | ICD-10-CM | POA: Diagnosis present

## 2018-08-31 DIAGNOSIS — I2510 Atherosclerotic heart disease of native coronary artery without angina pectoris: Secondary | ICD-10-CM | POA: Diagnosis not present

## 2018-08-31 DIAGNOSIS — Z79891 Long term (current) use of opiate analgesic: Secondary | ICD-10-CM

## 2018-08-31 DIAGNOSIS — E861 Hypovolemia: Secondary | ICD-10-CM | POA: Diagnosis present

## 2018-08-31 DIAGNOSIS — J440 Chronic obstructive pulmonary disease with acute lower respiratory infection: Secondary | ICD-10-CM | POA: Diagnosis not present

## 2018-08-31 DIAGNOSIS — J189 Pneumonia, unspecified organism: Secondary | ICD-10-CM | POA: Diagnosis not present

## 2018-08-31 DIAGNOSIS — R0689 Other abnormalities of breathing: Secondary | ICD-10-CM | POA: Diagnosis not present

## 2018-08-31 DIAGNOSIS — J8410 Pulmonary fibrosis, unspecified: Secondary | ICD-10-CM | POA: Diagnosis present

## 2018-08-31 DIAGNOSIS — R069 Unspecified abnormalities of breathing: Secondary | ICD-10-CM | POA: Diagnosis not present

## 2018-08-31 DIAGNOSIS — D649 Anemia, unspecified: Secondary | ICD-10-CM | POA: Diagnosis not present

## 2018-08-31 LAB — APTT: aPTT: 33 seconds (ref 24–36)

## 2018-08-31 LAB — CBC WITH DIFFERENTIAL/PLATELET
Abs Immature Granulocytes: 0.14 10*3/uL — ABNORMAL HIGH (ref 0.00–0.07)
Basophils Absolute: 0.1 10*3/uL (ref 0.0–0.1)
Basophils Relative: 1 %
Eosinophils Absolute: 0.1 10*3/uL (ref 0.0–0.5)
Eosinophils Relative: 1 %
HCT: 44.3 % (ref 39.0–52.0)
Hemoglobin: 13.6 g/dL (ref 13.0–17.0)
Immature Granulocytes: 1 %
Lymphocytes Relative: 15 %
Lymphs Abs: 2.5 10*3/uL (ref 0.7–4.0)
MCH: 28 pg (ref 26.0–34.0)
MCHC: 30.7 g/dL (ref 30.0–36.0)
MCV: 91.3 fL (ref 80.0–100.0)
Monocytes Absolute: 1 10*3/uL (ref 0.1–1.0)
Monocytes Relative: 6 %
Neutro Abs: 12.9 10*3/uL — ABNORMAL HIGH (ref 1.7–7.7)
Neutrophils Relative %: 76 %
Platelets: 590 10*3/uL — ABNORMAL HIGH (ref 150–400)
RBC: 4.85 MIL/uL (ref 4.22–5.81)
RDW: 16.9 % — ABNORMAL HIGH (ref 11.5–15.5)
WBC: 16.7 10*3/uL — ABNORMAL HIGH (ref 4.0–10.5)
nRBC: 0 % (ref 0.0–0.2)

## 2018-08-31 LAB — COMPREHENSIVE METABOLIC PANEL
ALT: 10 U/L (ref 0–44)
AST: 14 U/L — ABNORMAL LOW (ref 15–41)
Albumin: 2.6 g/dL — ABNORMAL LOW (ref 3.5–5.0)
Alkaline Phosphatase: 31 U/L — ABNORMAL LOW (ref 38–126)
Anion gap: 10 (ref 5–15)
BUN: 19 mg/dL (ref 8–23)
CO2: 21 mmol/L — ABNORMAL LOW (ref 22–32)
Calcium: 8.1 mg/dL — ABNORMAL LOW (ref 8.9–10.3)
Chloride: 102 mmol/L (ref 98–111)
Creatinine, Ser: 1.24 mg/dL (ref 0.61–1.24)
GFR calc Af Amer: >60 mL/min (ref >60)
GFR calc non Af Amer: 56 mL/min — ABNORMAL LOW (ref >60)
Glucose, Bld: 126 mg/dL — ABNORMAL HIGH (ref 70–99)
Potassium: 3.7 mmol/L (ref 3.5–5.1)
Sodium: 133 mmol/L — ABNORMAL LOW (ref 135–145)
Total Bilirubin: 0.7 mg/dL (ref 0.3–1.2)
Total Protein: 8.3 g/dL — ABNORMAL HIGH (ref 6.5–8.1)

## 2018-08-31 LAB — URINALYSIS, ROUTINE W REFLEX MICROSCOPIC
Bilirubin Urine: NEGATIVE
Glucose, UA: NEGATIVE mg/dL
Hgb urine dipstick: NEGATIVE
Ketones, ur: NEGATIVE mg/dL
Leukocytes,Ua: NEGATIVE
Nitrite: NEGATIVE
Protein, ur: NEGATIVE mg/dL
Specific Gravity, Urine: 1.017 (ref 1.005–1.030)
pH: 5 (ref 5.0–8.0)

## 2018-08-31 LAB — SARS CORONAVIRUS 2 BY RT PCR (HOSPITAL ORDER, PERFORMED IN ~~LOC~~ HOSPITAL LAB): SARS Coronavirus 2: NEGATIVE

## 2018-08-31 LAB — LACTIC ACID, PLASMA: Lactic Acid, Venous: 1.6 mmol/L (ref 0.5–1.9)

## 2018-08-31 LAB — PROTIME-INR
INR: 1.1 (ref 0.8–1.2)
Prothrombin Time: 14 seconds (ref 11.4–15.2)

## 2018-08-31 LAB — BRAIN NATRIURETIC PEPTIDE: B Natriuretic Peptide: 112 pg/mL — ABNORMAL HIGH (ref 0.0–100.0)

## 2018-08-31 MED ORDER — VANCOMYCIN HCL 1.5 G IV SOLR
1500.0000 mg | Freq: Once | INTRAVENOUS | Status: AC
  Administered 2018-08-31: 1500 mg via INTRAVENOUS
  Filled 2018-08-31: qty 1500

## 2018-08-31 MED ORDER — SODIUM CHLORIDE 0.9 % IV BOLUS
2000.0000 mL | Freq: Once | INTRAVENOUS | Status: AC
  Administered 2018-08-31: 2000 mL via INTRAVENOUS

## 2018-08-31 MED ORDER — VANCOMYCIN HCL 10 G IV SOLR
INTRAVENOUS | Status: AC
  Filled 2018-08-31: qty 1500

## 2018-08-31 MED ORDER — SODIUM CHLORIDE 0.9 % IV SOLN
1.0000 g | Freq: Once | INTRAVENOUS | Status: AC
  Administered 2018-08-31: 1 g via INTRAVENOUS
  Filled 2018-08-31: qty 1

## 2018-08-31 MED ORDER — VANCOMYCIN HCL 500 MG IV SOLR
500.0000 mg | Freq: Two times a day (BID) | INTRAVENOUS | Status: DC
  Administered 2018-09-01 (×2): 500 mg via INTRAVENOUS
  Filled 2018-08-31 (×5): qty 500

## 2018-08-31 NOTE — ED Provider Notes (Addendum)
[facility] EMERGENCY DEPARTMENT Provider Note   CSN: [id number] Arrival date & time: 08/31/18  2114     History   Chief Complaint Chief Complaint  Patient presents with  . Shortness of Breath    HPI Bradley [name] is a 78 y.o. male.     The history is provided by the patient. No language interpreter was used.  Shortness of Breath Severity:  Moderate Onset quality:  Gradual Duration:  1 day Timing:  Constant Progression:  Worsening Chronicity:  Recurrent Relieved by:  Nothing Worsened by:  Nothing Ineffective treatments:  None tried Pt has a history of lung cancer.  Pt reports he has become increasingly short of breath today.  Pt reports his oxygen at home was to low and he could not breath.  Pt states he was at 3 liters and he is suppose to be at 4.  Pt reports he had a pleurovac removed on 7/29.  Pt reports he has sutures still in chest.   Pt has non-small cell carcinoma.  Pt has been seen by Pallative care  Past Medical History:  Diagnosis Date  . Cancer ([place])    lung  . Chronic lower back pain   . Chronic pain    pain management  . Contact with stonefish as cause of accidental injury 1954   "stung across my left hand in [place]; LUE weaker since"  . COPD (chronic obstructive pulmonary disease) ([place])   . Coronary artery disease   . Dyspnea    with exertion  . High cholesterol   . History of blood transfusion 1943  . History of kidney stones    passed  . Hypertension   . Mass of left lung   . Migraine    "none since the 1990s" (09/11/2016)  . Myocardial infarction ([place]) 01/20/1993; 02/28/1993  . Neuromuscular disorder (H[facility])    neuropathy left  . On home O2   . Pneumonia    "several times" (09/11/2016)  . Tobacco abuse    ongoing    Patient Active Problem List   Diagnosis Date Noted  . Acute respiratory failure with hypoxia ([place]) 08/03/2018  . Hyperglycemia 08/03/2018  . Acute on chronic respiratory failure with hypoxia ([place]) 08/03/2018  . Recurrent left  pleural effusion 06/25/2018  . Postobstructive pneumonia 06/25/2018  . On home O2 06/25/2018  . Acute on chronic respiratory failure ([place]) 06/25/2018  . Metastatic cancer ([place])   . Palliative care encounter   . Encounter for hospice care discussion   . HCAP (healthcare-associated pneumonia) 06/13/2018  . Acute maxillary sinusitis 12/30/2017  . Palliative care by specialist   . Goals of care, counseling/discussion   . Malignant neoplasm of left lung ([place])   . Left leg weakness   . Nosocomial pneumonia 12/29/2017  . Non-small cell carcinoma of left lung ([place]) 12/29/2017  . Mass of lower lobe of left lung 12/10/2017  . Chest pain in adult   . Syncope and collapse   . Rt Hip fracture ([place]) 12/19/2016  . Chest pain 09/12/2016  . Leukocytosis 09/12/2016  . Abnormal chest x-ray 09/12/2016  . Hyperlipidemia 09/12/2016  . Hypokalemia 09/12/2016  . Precordial pain   . Tobacco abuse 09/11/2016  . COPD (chronic obstructive pulmonary disease) ([place]) 09/11/2016  . CAD (coronary artery disease), native coronary artery 09/11/2016  . Chronic pain syndrome 09/11/2016  . Essential hypertension 09/11/2016  . Right-sided epistaxis 03/15/2016    Past Surgical History:  Procedure Laterality Date  . APPENDECTOMY    .  CARDIAC CATHETERIZATION    . CATARACT EXTRACTION, BILATERAL Bilateral   . CHEST TUBE INSERTION Left 06/28/2018   Procedure: INSERTION PLEURAL DRAINAGE CATHETER;  Surgeon: Ivin Poot, MD;  Location: [facility];  Service: Thoracic;  Laterality: Left;  . COLONOSCOPY    . HIP ARTHROPLASTY Right 12/20/2016   Procedure: ARTHROPLASTY BIPOLAR HIP (HEMIARTHROPLASTY);  Surgeon: [name], MD;  Location: [facility];  Service: Orthopedics;  Laterality: Right;  . IR THORACENTESIS ASP PLEURAL SPACE W/IMG GUIDE  06/15/2018  . JOINT REPLACEMENT    . LEFT HEART CATH AND CORONARY ANGIOGRAPHY N/A 09/12/2016   Procedure: LEFT HEART CATH AND CORONARY ANGIOGRAPHY;  Surgeon: Burnell Blanks, MD;   Location: [facility];  Service: Cardiovascular;  Laterality: N/A;  . NASAL ENDOSCOPY WITH EPISTAXIS CONTROL Right 03/15/2016   Procedure: NASAL ENDOSCOPY WITH EPISTAXIS CONTROL;  Surgeon: Jodi Marble, MD;  Location: [facility];  Service: ENT;  Laterality: Right;  . PORTACATH PLACEMENT Right 04/23/2018   Procedure: INSERTION PORT-A-CATH;  Surgeon: Aviva Signs, MD;  Location: [facility];  Service: General;  Laterality: Right;  . SEPTOPLASTY Right 03/15/2016   Procedure: SEPTOPLASTY;  Surgeon: Jodi Marble, MD;  Location: [place];  Service: ENT;  Laterality: Right;  . SINUS EXPLORATION Right 03/15/2016   Procedure: SINUS EXPLORATION;  Surgeon: Jodi Marble, MD;  Location: [facility];  Service: ENT;  Laterality: Right;  . TONSILLECTOMY    . TOOTH EXTRACTION    . TOTAL KNEE ARTHROPLASTY Left   . VIDEO BRONCHOSCOPY WITH ENDOBRONCHIAL ULTRASOUND N/A 12/15/2017   Procedure: VIDEO BRONCHOSCOPY WITH ENDOBRONCHIAL ULTRASOUND;  Surgeon: Rigoberto Noel, MD;  Location: [facility];  Service: Thoracic;  Laterality: N/A;        Home Medications    Prior to Admission medications   Medication Sig Start Date End Date Taking? Authorizing Provider  ADVAIR HFA 115-21 MCG/ACT inhaler Inhale 2 puffs into the lungs 2 (two) times daily. 08/05/18   Emokpae, Courage, MD  amLODipine (NORVASC) 5 MG tablet TAKE 1 TABLET BY MOUTH EVERY DAY Patient taking differently: Take 5 mg by mouth daily.  08/24/17   Lendon Colonel, NP  Ascorbic Acid (VITAMIN C) 500 MG CAPS Take 500 mg by mouth daily.    [provider]  aspirin EC 81 MG tablet Take 81 mg by mouth daily.     [provider]  cyclobenzaprine (FLEXERIL) 5 MG tablet Take 5 mg by mouth 3 (three) times daily.     [provider]  Ferrous Sulfate (IRON) 325 (65 Fe) MG TABS Take 1 tablet by mouth daily. 12/10/15   [provider]  fluticasone (FLONASE) 50 MCG/ACT nasal spray Place 2 sprays into both nostrils daily. 12/11/17   [provider]  furosemide (LASIX) 20 MG tablet Take 1 tablet (20 mg total) by mouth daily. 08/05/18 08/05/19  Roxan Hockey, MD  gabapentin (NEURONTIN) 300 MG capsule Take 1 capsule by mouth 3 (three) times daily. 08/15/16   [provider]  Ipilimumab (YERVOY IV) Inject into the vein every 6 (six) weeks.    [provider]  ipratropium-albuterol (DUONEB) 0.5-2.5 (3) MG/3ML SOLN Take 3 mLs by nebulization every 6 (six) hours as needed (for shortness of breath). 08/05/18   Roxan Hockey, MD  lidocaine-prilocaine (EMLA) cream Apply 1 application topically as needed. Apply a small amount to port site and cover with plastic wrap 1 hour prior to infusion appointments 04/23/18   Derek Jack, MD  lisinopril (ZESTRIL) 20 MG tablet Take  1 tablet (20 mg total) by mouth daily. 06/18/18   Aline August, MD  metoCLOPramide (REGLAN) 5 MG tablet Take 1 tablet by mouth 4 (four) times daily -  before meals and at bedtime. 01/25/16   [provider]  metoprolol succinate (TOPROL-XL) 25 MG 24 hr tablet Take 25 mg by mouth daily.    [provider]  montelukast (SINGULAIR) 10 MG tablet Take 1 tablet by mouth daily. 06/02/18   [provider]  Multiple Vitamins-Minerals (MULTIVITAMIN WITH MINERALS) tablet Take 1 tablet by mouth daily.    [provider]  MYRBETRIQ 50 MG TB24 tablet Take 1 tablet by mouth daily. 06/12/18   [provider]  nitroGLYCERIN (NITROSTAT) 0.4 MG SL tablet Place 0.4 mg under the tongue every 5 (five) minutes as needed for chest pain.    [provider]  Nivolumab (OPDIVO IV) Inject into the vein every 14 (fourteen) days.    [provider]  Oxycodone HCl 10 MG TABS Take 1 tablet by mouth every 4 (four) hours.     [provider]  polyethylene glycol (MIRALAX / GLYCOLAX) 17 g packet Take 17 g by mouth daily as needed for mild constipation. 06/18/18   Aline August, MD  potassium gluconate (HM POTASSIUM)  595 (99 K) MG TABS tablet Take 595 mg by mouth daily.    [provider]  predniSONE (DELTASONE) 20 MG tablet Take 1 tablet (20 mg total) by mouth daily with breakfast. 08/05/18   Emokpae, Courage, MD  PRILOSEC 40 MG capsule Take 1 capsule by mouth daily. 03/31/18   [provider]  simvastatin (ZOCOR) 10 MG tablet Take 10 mg by mouth at bedtime.  12/23/15   [provider]  VENTOLIN HFA 108 (90 Base) MCG/ACT inhaler Inhale 2 puffs into the lungs every 4 (four) hours as needed for wheezing or shortness of breath. 08/05/18 09/04/18  Roxan Hockey, MD    Family History Family History  Adopted: Yes  Problem Relation Age of Onset  . Cancer Mother   . Obesity Father     Social History Social History   Tobacco Use  . Smoking status: Current Every Day Smoker    Packs/day: 1.00    Years: 70.00    Pack years: 70.00    Types: Cigarettes  . Smokeless tobacco: Never Used  . Tobacco comment: smokes 8 a day  Substance Use Topics  . Alcohol use: No    Comment: 11/26/2017  "drank from the age of 8 til 03/05/1993"  . Drug use: Not Currently    Comment: 11/26/2017 "used some drugs when I was young"     Allergies   Aspirin   Review of Systems Review of Systems  Respiratory: Positive for shortness of breath.   All other systems reviewed and are negative.    Physical Exam Updated Vital Signs BP (!) 83/68 (BP Location: Left Arm)   Pulse (!) 113   Temp 98.7 F (37.1 C) (Oral)   Resp (!) 28   Ht 6' (1.829 m)   Wt 68 kg   SpO2 94%   BMI 20.34 kg/m   Physical Exam Vitals signs reviewed.  Constitutional:      Appearance: He is ill-appearing.  HENT:     Head: Normocephalic.  Eyes:     Pupils: Pupils are equal, round, and reactive to light.  Neck:     Musculoskeletal: Normal range of motion.  Cardiovascular:     Rate and Rhythm: Tachycardia present.  Pulmonary:  Effort: Pulmonary effort is normal.     Breath sounds: Decreased breath sounds and rhonchi  present.  Musculoskeletal: Normal range of motion.  Skin:    General: Skin is warm.  Neurological:     General: No focal deficit present.     Mental Status: He is alert.  Psychiatric:        Mood and Affect: Mood normal.      ED Treatments / Results  Labs (all labs ordered are listed, but only abnormal results are displayed) Labs Reviewed  CBC WITH DIFFERENTIAL/PLATELET - Abnormal; Notable for the following components:      Result Value   WBC 16.7 (*)    RDW 16.9 (*)    Platelets 590 (*)    Neutro Abs 12.9 (*)    Abs Immature Granulocytes 0.14 (*)    All other components within normal limits  CULTURE, BLOOD (ROUTINE X 2)  CULTURE, BLOOD (ROUTINE X 2)  URINE CULTURE  SARS CORONAVIRUS 2 (HOSPITAL ORDER, [facility])  URINALYSIS, ROUTINE W REFLEX MICROSCOPIC  LACTIC ACID, PLASMA  LACTIC ACID, PLASMA  COMPREHENSIVE METABOLIC PANEL  APTT  PROTIME-INR  BRAIN NATRIURETIC PEPTIDE    EKG None  Radiology Dg Chest Portable 1 View  Result Date: 08/31/2018 CLINICAL DATA:  Shortness of breath for 2 days EXAM: PORTABLE CHEST 1 VIEW COMPARISON:  08/25/2018 FINDINGS: Cardiac shadow is stable. Right chest wall port is again seen. Diffuse fibrotic changes are again identified throughout both lungs. Some increased infiltrate in the left upper lobe is noted new from the prior exam. No sizable acute effusion is seen. No bony abnormality is noted. IMPRESSION: Acute on chronic infiltrate in the left upper lobe. Diffuse fibrotic changes are again identified. Electronically Signed   By: Inez Catalina M.D.   On: 08/31/2018 22:15    Procedures .Critical Care Performed by: Fransico Meadow, PA-C Authorized by: Fransico Meadow, PA-C   Critical care provider statement:    Critical care time (minutes):  45   Critical care start time:  08/31/2018 9:00 PM   Critical care end time:  08/31/2018 11:00 PM   Critical care was time spent personally by me on the following  activities:  Discussions with consultants, evaluation of patient's response to treatment, examination of patient, ordering and performing treatments and interventions, ordering and review of laboratory studies, ordering and review of radiographic studies, pulse oximetry, re-evaluation of patient's condition, obtaining history from patient or surrogate and review of old charts   I assumed direction of critical care for this patient from another provider in my specialty: no     (including critical care time)  Medications Ordered in ED Medications  ceFEPIme (MAXIPIME) 1 g in sodium chloride 0.9 % 100 mL IVPB (1 g Intravenous New Bag/Given 08/31/18 2209)  sodium chloride 0.9 % bolus 2,000 mL (2,000 mLs Intravenous New Bag/Given 08/31/18 2205)     Initial Impression / Assessment and Plan / ED Course  I have reviewed the triage vital signs and the nursing notes.  Pertinent labs & imaging results that were available during my care of the patient were reviewed by me and considered in my medical decision making (see chart for details).        Pt's 02 sats low 80's on 4 liters.  Bp 83/68.  Rn increased 02 to 7 liters.  02 increased to 905.  Pt reports he feels better.  Chest xray shows new infiltrate.   Iv antibiotics given  Hospitalist  consulted for admission   Final Clinical Impressions(s) / ED Diagnoses   Final diagnoses:  HCAP (healthcare-associated pneumonia)  Dyspnea, unspecified type  Hypoxia    ED Discharge Orders    None    An After Visit Summary was printed and given to the patient.    Sidney Ace 08/31/18 2357    Milton Ferguson, MD 09/03/18 0923    Fransico Meadow, PA-C 09/26/18 Juliann Mule, MD 10/03/18 [phone]

## 2018-08-31 NOTE — ED Triage Notes (Signed)
Pt brought in by [facility] for c/o sob that started x 2 days ago; family states pt's O2 concentrator has been cutting off and on and not working properly; pt is c/o generalized body aches; pt also states he feels weak and his legs have been giving out from under him; p has liver and lung cancer and last had chemo on July 28th, pt states he is due for another treatment on August 13th

## 2018-08-31 NOTE — Progress Notes (Signed)
Pharmacy Antibiotic Note  Bradley [name] is a 78 y.o. male admitted on 08/31/2018 with pneumonia.  Pharmacy has been consulted for Vancomycin dosing.  Plan: Vancomycin 1500mg  IV loading dose, then 500mg  IV every 12 hours.  Goal trough 15-20 mcg/mL. F/u cxs and clinical progress Monitor V/S, labs and levels as indicated  Height: 6' (182.9 cm) Weight: 150 lb (68 kg) IBW/kg (Calculated) : 77.6  Temp (24hrs), Avg:98.7 F (37.1 C), Min:98.7 F (37.1 C), Max:98.7 F (37.1 C)  Recent Labs  Lab 08/31/18 2202 08/31/18 2203  WBC  --  16.7*  CREATININE  --  1.24  LATICACIDVEN 1.6  --     Estimated Creatinine Clearance: 48 mL/min (by C-G formula based on SCr of 1.24 mg/dL).    Allergies  Allergen Reactions  . Aspirin     Allergy to regular Aspirin only      Antimicrobials this admission: Vancomycin 8/4>>  Cefepime 8/4 >>   Dose adjustments this admission:   Microbiology results: 8/4 BCx: pending  UCx:   MRSA PCR:   Thank you for allowing pharmacy to be a part of this patient's care.  Cristy Friedlander 08/31/2018 10:42 PM

## 2018-09-01 DIAGNOSIS — Z720 Tobacco use: Secondary | ICD-10-CM | POA: Diagnosis not present

## 2018-09-01 DIAGNOSIS — D649 Anemia, unspecified: Secondary | ICD-10-CM | POA: Diagnosis present

## 2018-09-01 DIAGNOSIS — R0902 Hypoxemia: Secondary | ICD-10-CM | POA: Diagnosis not present

## 2018-09-01 DIAGNOSIS — I129 Hypertensive chronic kidney disease with stage 1 through stage 4 chronic kidney disease, or unspecified chronic kidney disease: Secondary | ICD-10-CM | POA: Diagnosis present

## 2018-09-01 DIAGNOSIS — C3492 Malignant neoplasm of unspecified part of left bronchus or lung: Secondary | ICD-10-CM | POA: Diagnosis not present

## 2018-09-01 DIAGNOSIS — J439 Emphysema, unspecified: Secondary | ICD-10-CM | POA: Diagnosis not present

## 2018-09-01 DIAGNOSIS — J9621 Acute and chronic respiratory failure with hypoxia: Secondary | ICD-10-CM | POA: Diagnosis present

## 2018-09-01 DIAGNOSIS — C3490 Malignant neoplasm of unspecified part of unspecified bronchus or lung: Secondary | ICD-10-CM | POA: Diagnosis not present

## 2018-09-01 DIAGNOSIS — E861 Hypovolemia: Secondary | ICD-10-CM | POA: Diagnosis present

## 2018-09-01 DIAGNOSIS — E785 Hyperlipidemia, unspecified: Secondary | ICD-10-CM | POA: Diagnosis present

## 2018-09-01 DIAGNOSIS — N183 Chronic kidney disease, stage 3 (moderate): Secondary | ICD-10-CM | POA: Diagnosis present

## 2018-09-01 DIAGNOSIS — G8929 Other chronic pain: Secondary | ICD-10-CM | POA: Diagnosis present

## 2018-09-01 DIAGNOSIS — J9601 Acute respiratory failure with hypoxia: Secondary | ICD-10-CM | POA: Diagnosis not present

## 2018-09-01 DIAGNOSIS — J189 Pneumonia, unspecified organism: Secondary | ICD-10-CM | POA: Diagnosis not present

## 2018-09-01 DIAGNOSIS — R9389 Abnormal findings on diagnostic imaging of other specified body structures: Secondary | ICD-10-CM | POA: Diagnosis not present

## 2018-09-01 DIAGNOSIS — Z20828 Contact with and (suspected) exposure to other viral communicable diseases: Secondary | ICD-10-CM | POA: Diagnosis present

## 2018-09-01 DIAGNOSIS — R0600 Dyspnea, unspecified: Secondary | ICD-10-CM | POA: Insufficient documentation

## 2018-09-01 DIAGNOSIS — Z7401 Bed confinement status: Secondary | ICD-10-CM | POA: Diagnosis not present

## 2018-09-01 DIAGNOSIS — J441 Chronic obstructive pulmonary disease with (acute) exacerbation: Secondary | ICD-10-CM | POA: Diagnosis present

## 2018-09-01 DIAGNOSIS — I2510 Atherosclerotic heart disease of native coronary artery without angina pectoris: Secondary | ICD-10-CM | POA: Diagnosis present

## 2018-09-01 DIAGNOSIS — Z66 Do not resuscitate: Secondary | ICD-10-CM | POA: Diagnosis present

## 2018-09-01 DIAGNOSIS — J910 Malignant pleural effusion: Secondary | ICD-10-CM | POA: Diagnosis present

## 2018-09-01 DIAGNOSIS — I10 Essential (primary) hypertension: Secondary | ICD-10-CM | POA: Diagnosis not present

## 2018-09-01 DIAGNOSIS — J449 Chronic obstructive pulmonary disease, unspecified: Secondary | ICD-10-CM | POA: Diagnosis not present

## 2018-09-01 DIAGNOSIS — I252 Old myocardial infarction: Secondary | ICD-10-CM | POA: Diagnosis not present

## 2018-09-01 DIAGNOSIS — R52 Pain, unspecified: Secondary | ICD-10-CM | POA: Diagnosis not present

## 2018-09-01 DIAGNOSIS — F17210 Nicotine dependence, cigarettes, uncomplicated: Secondary | ICD-10-CM | POA: Diagnosis present

## 2018-09-01 DIAGNOSIS — J440 Chronic obstructive pulmonary disease with acute lower respiratory infection: Secondary | ICD-10-CM | POA: Diagnosis present

## 2018-09-01 DIAGNOSIS — Z515 Encounter for palliative care: Secondary | ICD-10-CM | POA: Diagnosis not present

## 2018-09-01 DIAGNOSIS — Z8701 Personal history of pneumonia (recurrent): Secondary | ICD-10-CM | POA: Diagnosis not present

## 2018-09-01 DIAGNOSIS — Z9981 Dependence on supplemental oxygen: Secondary | ICD-10-CM | POA: Diagnosis not present

## 2018-09-01 DIAGNOSIS — Y95 Nosocomial condition: Secondary | ICD-10-CM | POA: Diagnosis present

## 2018-09-01 DIAGNOSIS — J8410 Pulmonary fibrosis, unspecified: Secondary | ICD-10-CM | POA: Diagnosis not present

## 2018-09-01 DIAGNOSIS — R739 Hyperglycemia, unspecified: Secondary | ICD-10-CM | POA: Diagnosis present

## 2018-09-01 DIAGNOSIS — A419 Sepsis, unspecified organism: Secondary | ICD-10-CM | POA: Diagnosis present

## 2018-09-01 DIAGNOSIS — E871 Hypo-osmolality and hyponatremia: Secondary | ICD-10-CM | POA: Diagnosis present

## 2018-09-01 LAB — BLOOD CULTURE ID PANEL (REFLEXED)

## 2018-09-01 LAB — BLOOD GAS, ARTERIAL
Acid-base deficit: 1 mmol/L (ref 0.0–2.0)
Bicarbonate: 23.4 mmol/L (ref 20.0–28.0)
FIO2: 60
O2 Saturation: 86.7 %
Patient temperature: 37
pCO2 arterial: 38.3 mmHg (ref 32.0–48.0)
pH, Arterial: 7.399 (ref 7.350–7.450)
pO2, Arterial: 54.7 mmHg — ABNORMAL LOW (ref 83.0–108.0)

## 2018-09-01 LAB — STREP PNEUMONIAE URINARY ANTIGEN: Strep Pneumo Urinary Antigen: NEGATIVE

## 2018-09-01 LAB — MRSA PCR SCREENING: MRSA by PCR: NEGATIVE

## 2018-09-01 LAB — LACTIC ACID, PLASMA: Lactic Acid, Venous: 1.1 mmol/L (ref 0.5–1.9)

## 2018-09-01 MED ORDER — MONTELUKAST SODIUM 10 MG PO TABS
10.0000 mg | ORAL_TABLET | Freq: Every day | ORAL | Status: DC
  Administered 2018-09-01 – 2018-09-09 (×7): 10 mg via ORAL
  Filled 2018-09-01 (×8): qty 1

## 2018-09-01 MED ORDER — ALBUTEROL SULFATE HFA 108 (90 BASE) MCG/ACT IN AERS
2.0000 | INHALATION_SPRAY | RESPIRATORY_TRACT | Status: DC | PRN
  Filled 2018-09-01: qty 6.7

## 2018-09-01 MED ORDER — ENOXAPARIN SODIUM 40 MG/0.4ML ~~LOC~~ SOLN
40.0000 mg | SUBCUTANEOUS | Status: DC
  Administered 2018-09-01 – 2018-09-09 (×8): 40 mg via SUBCUTANEOUS
  Filled 2018-09-01 (×8): qty 0.4

## 2018-09-01 MED ORDER — FLUTICASONE FUROATE-VILANTEROL 200-25 MCG/INH IN AEPB
1.0000 | INHALATION_SPRAY | Freq: Every day | RESPIRATORY_TRACT | Status: DC
  Administered 2018-09-01 – 2018-09-09 (×8): 1 via RESPIRATORY_TRACT
  Filled 2018-09-01: qty 28

## 2018-09-01 MED ORDER — VITAMIN C 500 MG PO TABS
500.0000 mg | ORAL_TABLET | Freq: Every day | ORAL | Status: DC
  Administered 2018-09-01 – 2018-09-09 (×8): 500 mg via ORAL
  Filled 2018-09-01 (×11): qty 1

## 2018-09-01 MED ORDER — SODIUM CHLORIDE 0.9 % IV SOLN
INTRAVENOUS | Status: DC
  Administered 2018-09-01: 01:00:00 via INTRAVENOUS

## 2018-09-01 MED ORDER — SODIUM CHLORIDE 0.9 % IV SOLN
2.0000 g | Freq: Two times a day (BID) | INTRAVENOUS | Status: DC
  Administered 2018-09-01 – 2018-09-02 (×2): 2 g via INTRAVENOUS
  Filled 2018-09-01 (×2): qty 2

## 2018-09-01 MED ORDER — FLUTICASONE PROPIONATE 50 MCG/ACT NA SUSP
2.0000 | Freq: Every day | NASAL | Status: DC
  Administered 2018-09-01 – 2018-09-09 (×8): 2 via NASAL
  Filled 2018-09-01 (×2): qty 16

## 2018-09-01 MED ORDER — MIRABEGRON ER 25 MG PO TB24
50.0000 mg | ORAL_TABLET | Freq: Every day | ORAL | Status: DC
  Administered 2018-09-01 – 2018-09-09 (×8): 50 mg via ORAL
  Filled 2018-09-01 (×12): qty 2

## 2018-09-01 MED ORDER — PANTOPRAZOLE SODIUM 40 MG PO TBEC
40.0000 mg | DELAYED_RELEASE_TABLET | Freq: Every day | ORAL | Status: DC
  Administered 2018-09-01 – 2018-09-09 (×8): 40 mg via ORAL
  Filled 2018-09-01 (×9): qty 1

## 2018-09-01 MED ORDER — CYCLOBENZAPRINE HCL 10 MG PO TABS
5.0000 mg | ORAL_TABLET | Freq: Three times a day (TID) | ORAL | Status: DC
  Administered 2018-09-01 – 2018-09-03 (×7): 5 mg via ORAL
  Filled 2018-09-01 (×7): qty 1

## 2018-09-01 MED ORDER — ADULT MULTIVITAMIN W/MINERALS CH
1.0000 | ORAL_TABLET | Freq: Every day | ORAL | Status: DC
  Administered 2018-09-01 – 2018-09-09 (×8): 1 via ORAL
  Filled 2018-09-01 (×9): qty 1

## 2018-09-01 MED ORDER — METOCLOPRAMIDE HCL 10 MG PO TABS
5.0000 mg | ORAL_TABLET | Freq: Three times a day (TID) | ORAL | Status: DC
  Administered 2018-09-01 – 2018-09-09 (×29): 5 mg via ORAL
  Filled 2018-09-01 (×30): qty 1

## 2018-09-01 MED ORDER — GABAPENTIN 300 MG PO CAPS
300.0000 mg | ORAL_CAPSULE | Freq: Three times a day (TID) | ORAL | Status: DC
  Administered 2018-09-01 – 2018-09-09 (×23): 300 mg via ORAL
  Filled 2018-09-01 (×23): qty 1

## 2018-09-01 MED ORDER — FUROSEMIDE 10 MG/ML IJ SOLN
20.0000 mg | Freq: Once | INTRAMUSCULAR | Status: AC
  Administered 2018-09-01: 20 mg via INTRAVENOUS
  Filled 2018-09-01: qty 2

## 2018-09-01 MED ORDER — IPRATROPIUM-ALBUTEROL 0.5-2.5 (3) MG/3ML IN SOLN
3.0000 mL | Freq: Four times a day (QID) | RESPIRATORY_TRACT | Status: DC | PRN
  Administered 2018-09-01 – 2018-09-09 (×3): 3 mL via RESPIRATORY_TRACT
  Filled 2018-09-01 (×2): qty 3

## 2018-09-01 MED ORDER — IPRATROPIUM-ALBUTEROL 0.5-2.5 (3) MG/3ML IN SOLN
3.0000 mL | RESPIRATORY_TRACT | Status: DC
  Administered 2018-09-01 – 2018-09-02 (×11): 3 mL via RESPIRATORY_TRACT
  Filled 2018-09-01 (×11): qty 3

## 2018-09-01 MED ORDER — ASPIRIN EC 81 MG PO TBEC
81.0000 mg | DELAYED_RELEASE_TABLET | Freq: Every day | ORAL | Status: DC
  Administered 2018-09-01 – 2018-09-09 (×8): 81 mg via ORAL
  Filled 2018-09-01 (×9): qty 1

## 2018-09-01 MED ORDER — POLYETHYLENE GLYCOL 3350 17 G PO PACK: 17.0000 g | PACK | Freq: Every day | ORAL | Status: DC | PRN

## 2018-09-01 MED ORDER — SODIUM CHLORIDE 0.9 % IV SOLN
1.0000 g | Freq: Two times a day (BID) | INTRAVENOUS | Status: DC
  Administered 2018-09-01 (×2): 1 g via INTRAVENOUS
  Filled 2018-09-01 (×2): qty 1

## 2018-09-01 MED ORDER — OXYCODONE HCL 5 MG PO TABS
10.0000 mg | ORAL_TABLET | ORAL | Status: DC
  Administered 2018-09-01 – 2018-09-09 (×45): 10 mg via ORAL
  Filled 2018-09-01 (×46): qty 2

## 2018-09-01 MED ORDER — FERROUS SULFATE 325 (65 FE) MG PO TABS
325.0000 mg | ORAL_TABLET | Freq: Every day | ORAL | Status: DC
  Administered 2018-09-02 – 2018-09-09 (×7): 325 mg via ORAL
  Filled 2018-09-01 (×9): qty 1

## 2018-09-01 MED ORDER — SIMVASTATIN 20 MG PO TABS
10.0000 mg | ORAL_TABLET | Freq: Every day | ORAL | Status: DC
  Administered 2018-09-01 – 2018-09-08 (×8): 10 mg via ORAL
  Filled 2018-09-01 (×8): qty 1

## 2018-09-01 NOTE — ED Notes (Signed)
Emptied urinal of 500 ml urine.

## 2018-09-01 NOTE — Progress Notes (Signed)
Patient had tried to get up on side of bed and became hypoxic, tachypneic and tachycardic. Patient placed on NRB mask and O2 sats came back up. Treatment given at 23:52 and patient placed back on HFNC at 15 LPM. Will titrate as able. Patient was on 13 LPM on arrival to floor. Patient's respiratory rate and heart rate are starting to go back down. Patient told not to try and get up without assistance.

## 2018-09-01 NOTE — ED Notes (Signed)
Gave patient meal tray.

## 2018-09-01 NOTE — Progress Notes (Signed)
COVERAGE NOTE   09/01/2018 4:27 PM  Bradley [name] was seen and examined.  The H&P by the admitting provider, orders, imaging was reviewed.  Please see new orders.  Will continue to follow.   Vitals:   09/01/18 1526 09/01/18 1530  BP:  (!) 130/104  Pulse:  91  Resp:  (!) 21  Temp:    SpO2: 96% (!) 89%    Results for orders placed or performed during the hospital encounter of 08/31/18  Blood Culture (routine x 2)   Specimen: BLOOD RIGHT HAND  Result Value Ref Range   Specimen Description BLOOD RIGHT HAND    Special Requests      BOTTLES DRAWN AEROBIC AND ANAEROBIC Blood Culture adequate volume   Culture      NO GROWTH < 12 HOURS Performed at [facility], [address], [place], [place] [address]    Report Status PENDING   Blood Culture (routine x 2)   Specimen: BLOOD  Result Value Ref Range   Specimen Description BLOOD    Special Requests NONE    Culture      NO GROWTH < 12 HOURS Performed at [facility], [address], [place], [place] [address]    Report Status PENDING   SARS Coronavirus 2 [facility] order, Performed in [facility] hospital lab) Nasopharyngeal Nasopharyngeal Swab   Specimen: Nasopharyngeal Swab  Result Value Ref Range   SARS Coronavirus 2 NEGATIVE NEGATIVE  Culture, respiratory  Result Value Ref Range   Specimen Description      EXPECTORATED SPUTUM Performed at [facility], [address], [place], [place] [address]    Special Requests      NONE Performed at [facility], [address], [place], [place] [address]    Gram Stain      FEW WBC PRESENT,BOTH PMN AND MONONUCLEAR FEW GRAM POSITIVE COCCI FEW GRAM VARIABLE ROD ABUNDANT YEAST ABUNDANT SQUAMOUS EPITHELIAL CELLS PRESENT Performed at [facility], [place] [address], [place], [place] [address]    Culture PENDING    Report Status PENDING   Lactic acid, plasma  Result Value Ref Range   Lactic Acid, Venous 1.6 0.5 - 1.9 mmol/L  Lactic acid, plasma  Result Value Ref Range   Lactic  Acid, Venous 1.1 0.5 - 1.9 mmol/L  Comprehensive metabolic panel  Result Value Ref Range   Sodium 133 (L) 135 - 145 mmol/L   Potassium 3.7 3.5 - 5.1 mmol/L   Chloride 102 98 - 111 mmol/L   CO2 21 (L) 22 - 32 mmol/L   Glucose, Bld 126 (H) 70 - 99 mg/dL   BUN 19 8 - 23 mg/dL   Creatinine, Ser 1.24 0.61 - 1.24 mg/dL   Calcium 8.1 (L) 8.9 - 10.3 mg/dL   Total Protein 8.3 (H) 6.5 - 8.1 g/dL   Albumin 2.6 (L) 3.5 - 5.0 g/dL   AST 14 (L) 15 - 41 U/L   ALT 10 0 - 44 U/L   Alkaline Phosphatase 31 (L) 38 - 126 U/L   Total Bilirubin 0.7 0.3 - 1.2 mg/dL   GFR calc non Af Amer 56 (L) >60 mL/min   GFR calc Af Amer >60 >60 mL/min   Anion gap 10 5 - 15  CBC WITH DIFFERENTIAL  Result Value Ref Range   WBC 16.7 (H) 4.0 - 10.5 K/uL   RBC 4.85 4.22 - 5.81 MIL/uL   Hemoglobin 13.6 13.0 - 17.0 g/dL   HCT 44.3 39.0 - 52.0 %   MCV  91.3 80.0 - 100.0 fL   MCH 28.0 26.0 - 34.0 pg   MCHC 30.7 30.0 - 36.0 g/dL   RDW 16.9 (H) 11.5 - 15.5 %   Platelets 590 (H) 150 - 400 K/uL   nRBC 0.0 0.0 - 0.2 %   Neutrophils Relative % 76 %   Neutro Abs 12.9 (H) 1.7 - 7.7 K/uL   Lymphocytes Relative 15 %   Lymphs Abs 2.5 0.7 - 4.0 K/uL   Monocytes Relative 6 %   Monocytes Absolute 1.0 0.1 - 1.0 K/uL   Eosinophils Relative 1 %   Eosinophils Absolute 0.1 0.0 - 0.5 K/uL   Basophils Relative 1 %   Basophils Absolute 0.1 0.0 - 0.1 K/uL   Immature Granulocytes 1 %   Abs Immature Granulocytes 0.14 (H) 0.00 - 0.07 K/uL  APTT  Result Value Ref Range   aPTT 33 24 - 36 seconds  Protime-INR  Result Value Ref Range   Prothrombin Time 14.0 11.4 - 15.2 seconds   INR 1.1 0.8 - 1.2  Urinalysis, Routine w reflex microscopic  Result Value Ref Range   Color, Urine YELLOW YELLOW   APPearance CLEAR CLEAR   Specific Gravity, Urine 1.017 1.005 - 1.030   pH 5.0 5.0 - 8.0   Glucose, UA NEGATIVE NEGATIVE mg/dL   Hgb urine dipstick NEGATIVE NEGATIVE   Bilirubin Urine NEGATIVE NEGATIVE   Ketones, ur NEGATIVE NEGATIVE mg/dL    Protein, ur NEGATIVE NEGATIVE mg/dL   Nitrite NEGATIVE NEGATIVE   Leukocytes,Ua NEGATIVE NEGATIVE  Brain natriuretic peptide  Result Value Ref Range   B Natriuretic Peptide 112.0 (H) 0.0 - 100.0 pg/mL  Strep pneumoniae urinary antigen  Result Value Ref Range   Strep Pneumo Urinary Antigen NEGATIVE NEGATIVE  Blood gas, arterial  Result Value Ref Range   FIO2 60.00    pH, Arterial 7.399 7.350 - 7.450   pCO2 arterial 38.3 32.0 - 48.0 mmHg   pO2, Arterial 54.7 (L) 83.0 - 108.0 mmHg   Bicarbonate 23.4 20.0 - 28.0 mmol/L   Acid-base deficit 1.0 0.0 - 2.0 mmol/L   O2 Saturation 86.7 %   Patient temperature 37.0    Allens test (pass/fail) PASS PASS    Murvin Natal, MD [facility]   08/31/2018  9:20 PM How to contact the [facility] Attending or Consulting provider [place] or covering provider during after hours 7P -7A, for this patient?  1. Check the care team in [facility] and look for a) attending/consulting [facility] provider listed and b) the [facility] team listed 2. Log into [email or web address] and use [place]'s universal password to access. If you do not have the password, please contact the hospital operator. 3. Locate the [facility] provider you are looking for under [facility] and page to a number that you can be directly reached. 4. If you still have difficulty reaching the provider, please page the [facility] (Director on Call) for the Hospitalists listed on amion for assistance.

## 2018-09-01 NOTE — ED Notes (Signed)
Freda Munro - daughter  [phone] updated  Patient eating dinner, on high flow 12L/ humidity

## 2018-09-01 NOTE — H&P (Signed)
TRH H&P    Patient Demographics:    Bradley [name], is a 78 y.o. male  MRN: [id number]  DOB - 1940/11/27  Admit Date - 08/31/2018  Referring MD/NP/PA: PA Sofia   Outpatient Primary MD for the patient is Bradley Gravel, MD  Patient coming from: Home  Chief complaint- Shortness of breath    HPI:    Bradley [name]  is a 78 y.o. male, with history of non-small cell lung cancer, COPD, CAD, HLD, anemia, and hypertension who presents to the ED today via EMS for shortness of breath.  Patient reports that he was at home in the middle of supper when daughter said he did not look good and called EMS.  Patient reports that he had been progressively more short of breath over the previous 5 hours.  He reports that this is due to having the wrong oxygen prescription.  He reports that he supposed to be on 4 L nasal cannula and somehow his prescription is written for 3 L nasal cannula.  He comes into the ED was 77% oxygen saturation.  He required 7 L to maintain oxygen saturations above 90%.  Patient reports no associated chest pain, palpitations, nausea vomiting, diaphoresis, change in his cough, fever or chills.  Patient does report that he has a regular cough that is productive of yellow phlegm.  He reports that he is continued to have this cough, it is not increased in the production of sputum, nor has increased in frequency.  Unfortunately patient does continue to smoke.  He reports that he smokes 10 cigarettes a day.  Patient reports that he knows he is at end-of-life, so his oncologist has told him he can do "what ever he wants."  This is his fifth admission this year for pneumonia.  Last admission was August 03, 2018.  Chart review reveals that patient has non-small cell lung cancer.  He also has lesions in his liver.  Patient sees Dr. Raliegh Ip for oncology.  He has had treatment with ipilimumab.    ED course Code sepsis was called  secondary to leukocytosis, tachypnea, tachycardia, and a suspected source of pneumonia.  Patient was started on broad-spectrum antibiotics-cefepime and vancomycin.  Blood cultures were drawn.  Lactic acid was 1.6.  Chest x-ray showed acute on chronic infiltrate in left upper lobe.  Diffuse fibrotic changes.  Blood work was done showed a leukocytosis of 6.7, thrombocytosis at 590, hyponatremia at 133, elevated creatinine at 1.24, and hyperglycemia at 126 (postprandial).  Patient is being admitted for IV antibiotics, and to monitor respiratory status.  Patient is a DNR.   Review of systems:    In addition to the HPI above,  No Fever-chills, No Headache, No changes with Vision or hearing, No problems swallowing food or Liquids, No Chest pain, positive for acute shortness of breath, chronic cough No Abdominal pain, No Nausea or Vomiting, bowel movements are regular, No Blood in stool or Urine, No dysuria, No new skin rashes or bruises, No new joints pains-aches,  No new weakness, tingling, numbness in any extremity,  No recent weight gain or loss, No polyuria, polydypsia or polyphagia, No significant Mental Stressors.  All other systems reviewed and are negative.    Past History of the following :    Past Medical History:  Diagnosis Date  . Cancer ([facility])    lung  . Chronic lower back pain   . Chronic pain    pain management  . Contact with stonefish as cause of accidental injury 1954   "stung across my left hand in [place]; LUE weaker since"  . COPD (chronic obstructive pulmonary disease) ([facility])   . Coronary artery disease   . Dyspnea    with exertion  . High cholesterol   . History of blood transfusion 1943  . History of kidney stones    passed  . Hypertension   . Mass of left lung   . Migraine    "none since the 1990s" (09/11/2016)  . Myocardial infarction ([facility]) 01/20/1993; 02/28/1993  . Neuromuscular disorder ([facility])    neuropathy left  . On home O2   . Pneumonia     "several times" (09/11/2016)  . Tobacco abuse    ongoing      Past Surgical History:  Procedure Laterality Date  . APPENDECTOMY    . CARDIAC CATHETERIZATION    . CATARACT EXTRACTION, BILATERAL Bilateral   . CHEST TUBE INSERTION Left 06/28/2018   Procedure: INSERTION PLEURAL DRAINAGE CATHETER;  Surgeon: Ivin Poot, MD;  Location: [facility];  Service: Thoracic;  Laterality: Left;  . COLONOSCOPY    . HIP ARTHROPLASTY Right 12/20/2016   Procedure: ARTHROPLASTY BIPOLAR HIP (HEMIARTHROPLASTY);  Surgeon: [name], MD;  Location: [facility];  Service: Orthopedics;  Laterality: Right;  . IR THORACENTESIS ASP PLEURAL SPACE W/IMG GUIDE  06/15/2018  . JOINT REPLACEMENT    . LEFT HEART CATH AND CORONARY ANGIOGRAPHY N/A 09/12/2016   Procedure: LEFT HEART CATH AND CORONARY ANGIOGRAPHY;  Surgeon: Burnell Blanks, MD;  Location: [facility];  Service: Cardiovascular;  Laterality: N/A;  . NASAL ENDOSCOPY WITH EPISTAXIS CONTROL Right 03/15/2016   Procedure: NASAL ENDOSCOPY WITH EPISTAXIS CONTROL;  Surgeon: Jodi Marble, MD;  Location: [facility];  Service: ENT;  Laterality: Right;  . PORTACATH PLACEMENT Right 04/23/2018   Procedure: INSERTION PORT-A-CATH;  Surgeon: Aviva Signs, MD;  Location: [facility];  Service: General;  Laterality: Right;  . SEPTOPLASTY Right 03/15/2016   Procedure: SEPTOPLASTY;  Surgeon: Jodi Marble, MD;  Location: [facility];  Service: ENT;  Laterality: Right;  . SINUS EXPLORATION Right 03/15/2016   Procedure: SINUS EXPLORATION;  Surgeon: Jodi Marble, MD;  Location: [facility];  Service: ENT;  Laterality: Right;  . TONSILLECTOMY    . TOOTH EXTRACTION    . TOTAL KNEE ARTHROPLASTY Left   . VIDEO BRONCHOSCOPY WITH ENDOBRONCHIAL ULTRASOUND N/A 12/15/2017   Procedure: VIDEO BRONCHOSCOPY WITH ENDOBRONCHIAL ULTRASOUND;  Surgeon: Rigoberto Noel, MD;  Location: [facility];  Service: Thoracic;  Laterality: N/A;      Social History:      Social History   Tobacco Use  . Smoking status:  Current Every Day Smoker    Packs/day: 1.00    Years: 70.00    Pack years: 70.00    Types: Cigarettes  . Smokeless tobacco: Never Used  . Tobacco comment: smokes 8 a day  Substance Use Topics  . Alcohol use: No    Comment: 11/26/2017  "drank from the age of 64 til 03/05/1993"       Family History :  Family History  Adopted: Yes  Problem Relation Age of Onset  . Cancer Mother   . Obesity Father       Home Medications:   Prior to Admission medications   Medication Sig Start Date End Date Taking? Authorizing Provider  Oxycodone HCl 10 MG TABS Take 1 tablet by mouth every 4 (four) hours.    Yes [provider]  ADVAIR HFA 115-21 MCG/ACT inhaler Inhale 2 puffs into the lungs 2 (two) times daily. 08/05/18   Emokpae, Courage, MD  amLODipine (NORVASC) 5 MG tablet TAKE 1 TABLET BY MOUTH EVERY DAY Patient taking differently: Take 5 mg by mouth daily.  08/24/17   Lendon Colonel, NP  Ascorbic Acid (VITAMIN C) 500 MG CAPS Take 500 mg by mouth daily.    [provider]  aspirin EC 81 MG tablet Take 81 mg by mouth daily.     [provider]  cyclobenzaprine (FLEXERIL) 5 MG tablet Take 5 mg by mouth 3 (three) times daily.     [provider]  Ferrous Sulfate (IRON) 325 (65 Fe) MG TABS Take 1 tablet by mouth daily. 12/10/15   [provider]  fluticasone (FLONASE) 50 MCG/ACT nasal spray Place 2 sprays into both nostrils daily. 12/11/17   [provider]  furosemide (LASIX) 20 MG tablet Take 1 tablet (20 mg total) by mouth daily. 08/05/18 08/05/19  Roxan Hockey, MD  gabapentin (NEURONTIN) 300 MG capsule Take 1 capsule by mouth 3 (three) times daily. 08/15/16   [provider]  Ipilimumab (YERVOY IV) Inject into the vein every 6 (six) weeks.    [provider]  ipratropium-albuterol (DUONEB) 0.5-2.5 (3) MG/3ML SOLN Take 3 mLs by nebulization every 6 (six) hours as needed (for shortness of breath). 08/05/18   Roxan Hockey,  MD  lidocaine-prilocaine (EMLA) cream Apply 1 application topically as needed. Apply a small amount to port site and cover with plastic wrap 1 hour prior to infusion appointments 04/23/18   Derek Jack, MD  lisinopril (ZESTRIL) 20 MG tablet Take 1 tablet (20 mg total) by mouth daily. 06/18/18   Aline August, MD  metoCLOPramide (REGLAN) 5 MG tablet Take 1 tablet by mouth 4 (four) times daily -  before meals and at bedtime. 01/25/16   [provider]  metoprolol succinate (TOPROL-XL) 25 MG 24 hr tablet Take 25 mg by mouth daily.    [provider]  montelukast (SINGULAIR) 10 MG tablet Take 1 tablet by mouth daily. 06/02/18   [provider]  Multiple Vitamins-Minerals (MULTIVITAMIN WITH MINERALS) tablet Take 1 tablet by mouth daily.    [provider]  MYRBETRIQ 50 MG TB24 tablet Take 1 tablet by mouth daily. 06/12/18   [provider]  nitroGLYCERIN (NITROSTAT) 0.4 MG SL tablet Place 0.4 mg under the tongue every 5 (five) minutes as needed for chest pain.    [provider]  Nivolumab (OPDIVO IV) Inject into the vein every 14 (fourteen) days.    [provider]  polyethylene glycol (MIRALAX / GLYCOLAX) 17 g packet Take 17 g by mouth daily as needed for mild constipation. 06/18/18   Aline August, MD  potassium gluconate (HM POTASSIUM) 595 (99 K) MG TABS tablet Take 595 mg by mouth daily.    [provider]  predniSONE (DELTASONE) 20 MG tablet Take 1 tablet (20 mg total) by mouth daily with breakfast. 08/05/18   Emokpae, Courage, MD  PRILOSEC 40 MG capsule Take 1 capsule by mouth daily. 03/31/18  [provider]  simvastatin (ZOCOR) 10 MG tablet Take 10 mg by mouth at bedtime.  12/23/15   [provider]  VENTOLIN HFA 108 (90 Base) MCG/ACT inhaler Inhale 2 puffs into the lungs every 4 (four) hours as needed for wheezing or shortness of breath. 08/05/18 09/04/18  Roxan Hockey, MD     Allergies:      Allergies  Allergen Reactions  . Aspirin     Allergy to regular Aspirin only       Physical Exam:   Vitals  Blood pressure 128/74, pulse 95, temperature 98.7 F (37.1 C), temperature source Oral, resp. rate (!) 23, height 6' (1.829 m), weight 68 kg, SpO2 96 %.  1.  General: Lying supine in bed with nasal cannula in place, mask on, no acute distress  2. Psychiatric: Intact insight and judgment.  Alert and oriented x3.  3. Neurologic: No focal deficit on limited exam.  Cranial nerves II through XII grossly intact.  4. HEENMT:  Normocephalic atraumatic.  Sclera are anicteric.  Neck is cachectic, no masses.  5. Respiratory : Diminished lung sounds in the left lower lung field.  No wheezing or crackles.  6. Cardiovascular : H RRR.  No murmurs rubs or gallops.  7. Gastrointestinal:  Flat, soft, nontender  8. Skin:  No acute skin changes on limited skin exam     Data Review:    CBC Recent Labs  Lab 08/31/18 2203  WBC 16.7*  HGB 13.6  HCT 44.3  PLT 590*  MCV 91.3  MCH 28.0  MCHC 30.7  RDW 16.9*  LYMPHSABS 2.5  MONOABS 1.0  EOSABS 0.1  BASOSABS 0.1   ------------------------------------------------------------------------------------------------------------------  Results for orders placed or performed during the hospital encounter of 08/31/18 (from the past 48 hour(s))  Urinalysis, Routine w reflex microscopic     Status: None   Collection Time: 08/31/18  9:45 PM  Result Value Ref Range   Color, Urine YELLOW YELLOW   APPearance CLEAR CLEAR   Specific Gravity, Urine 1.017 1.005 - 1.030   pH 5.0 5.0 - 8.0   Glucose, UA NEGATIVE NEGATIVE mg/dL   Hgb urine dipstick NEGATIVE NEGATIVE   Bilirubin Urine NEGATIVE NEGATIVE   Ketones, ur NEGATIVE NEGATIVE mg/dL   Protein, ur NEGATIVE NEGATIVE mg/dL   Nitrite NEGATIVE NEGATIVE   Leukocytes,Ua NEGATIVE NEGATIVE    Comment: Performed at [facility], [address] [place], [place] [address]  SARS Coronavirus  2 [facility] order, Performed in [facility] hospital lab) Nasopharyngeal Nasopharyngeal Swab     Status: None   Collection Time: 08/31/18  9:46 PM   Specimen: Nasopharyngeal Swab  Result Value Ref Range   SARS Coronavirus 2 NEGATIVE NEGATIVE    Comment: (NOTE) If result is NEGATIVE SARS-CoV-2 target nucleic acids are NOT DETECTED. The SARS-CoV-2 RNA is generally detectable in upper and lower  respiratory specimens during the acute phase of infection. The lowest  concentration of SARS-CoV-2 viral copies this assay can detect is 250  copies / mL. A negative result does not preclude SARS-CoV-2 infection  and should not be used as the sole basis for treatment or other  patient management decisions.  A negative result may occur with  improper specimen collection / handling, submission of specimen other  than nasopharyngeal swab, presence of viral mutation(s) within the  areas targeted by this assay, and inadequate number of viral copies  (<250 copies / mL). A negative result must be combined with clinical  observations, patient history, and epidemiological  information. If result is POSITIVE SARS-CoV-2 target nucleic acids are DETECTED. The SARS-CoV-2 RNA is generally detectable in upper and lower  respiratory specimens dur ing the acute phase of infection.  Positive  results are indicative of active infection with SARS-CoV-2.  Clinical  correlation with patient history and other diagnostic information is  necessary to determine patient infection status.  Positive results do  not rule out bacterial infection or co-infection with other viruses. If result is PRESUMPTIVE POSTIVE SARS-CoV-2 nucleic acids MAY BE PRESENT.   A presumptive positive result was obtained on the submitted specimen  and confirmed on repeat testing.  While 2019 novel coronavirus  (SARS-CoV-2) nucleic acids may be present in the submitted sample  additional confirmatory testing may be necessary for epidemiological  and  / or clinical management purposes  to differentiate between  SARS-CoV-2 and other Sarbecovirus currently known to infect humans.  If clinically indicated additional testing with an alternate test  methodology [phone]) is advised. The SARS-CoV-2 RNA is generally  detectable in upper and lower respiratory sp ecimens during the acute  phase of infection. The expected result is Negative. Fact Sheet for Patients:  StrictlyIdeas.no Fact Sheet for Healthcare Providers: BankingDealers.co.za This test is not yet approved or cleared by the [place] FDA and has been authorized for detection and/or diagnosis of SARS-CoV-2 by FDA under an Emergency Use Authorization (EUA).  This EUA will remain in effect (meaning this test can be used) for the duration of the COVID-19 declaration under Section 564(b)(1) of the Act, 21 U.S.C. section 360bbb-3(b)(1), unless the authorization is terminated or revoked sooner. Performed at [facility], [address] [place], [place] [address]   Lactic acid, plasma     Status: None   Collection Time: 08/31/18 10:02 PM  Result Value Ref Range   Lactic Acid, Venous 1.6 0.5 - 1.9 mmol/L    Comment: Performed at [facility], [address] [place], [place] [address]  Comprehensive metabolic panel     Status: Abnormal   Collection Time: 08/31/18 10:03 PM  Result Value Ref Range   Sodium 133 (L) 135 - 145 mmol/L   Potassium 3.7 3.5 - 5.1 mmol/L   Chloride 102 98 - 111 mmol/L   CO2 21 (L) 22 - 32 mmol/L   Glucose, Bld 126 (H) 70 - 99 mg/dL   BUN 19 8 - 23 mg/dL   Creatinine, Ser 1.24 0.61 - 1.24 mg/dL   Calcium 8.1 (L) 8.9 - 10.3 mg/dL   Total Protein 8.3 (H) 6.5 - 8.1 g/dL   Albumin 2.6 (L) 3.5 - 5.0 g/dL   AST 14 (L) 15 - 41 U/L   ALT 10 0 - 44 U/L   Alkaline Phosphatase 31 (L) 38 - 126 U/L   Total Bilirubin 0.7 0.3 - 1.2 mg/dL   GFR calc non Af Amer 56 (L) >60 mL/min   GFR calc Af Amer >60 >60 mL/min   Anion  gap 10 5 - 15    Comment: Performed at [facility], [address] [place], [place] [address]  CBC WITH DIFFERENTIAL     Status: Abnormal   Collection Time: 08/31/18 10:03 PM  Result Value Ref Range   WBC 16.7 (H) 4.0 - 10.5 K/uL   RBC 4.85 4.22 - 5.81 MIL/uL   Hemoglobin 13.6 13.0 - 17.0 g/dL   HCT 44.3 39.0 - 52.0 %   MCV 91.3 80.0 - 100.0 fL   MCH 28.0 26.0 - 34.0 pg   MCHC 30.7 30.0 -  36.0 g/dL   RDW 16.9 (H) 11.5 - 15.5 %   Platelets 590 (H) 150 - 400 K/uL   nRBC 0.0 0.0 - 0.2 %   Neutrophils Relative % 76 %   Neutro Abs 12.9 (H) 1.7 - 7.7 K/uL   Lymphocytes Relative 15 %   Lymphs Abs 2.5 0.7 - 4.0 K/uL   Monocytes Relative 6 %   Monocytes Absolute 1.0 0.1 - 1.0 K/uL   Eosinophils Relative 1 %   Eosinophils Absolute 0.1 0.0 - 0.5 K/uL   Basophils Relative 1 %   Basophils Absolute 0.1 0.0 - 0.1 K/uL   Immature Granulocytes 1 %   Abs Immature Granulocytes 0.14 (H) 0.00 - 0.07 K/uL    Comment: Performed at [facility], [address], [place], [place] [address]  APTT     Status: None   Collection Time: 08/31/18 10:03 PM  Result Value Ref Range   aPTT 33 24 - 36 seconds    Comment: Performed at [facility], [address] [place], [place] [address]  Protime-INR     Status: None   Collection Time: 08/31/18 10:03 PM  Result Value Ref Range   Prothrombin Time 14.0 11.4 - 15.2 seconds   INR 1.1 0.8 - 1.2    Comment: (NOTE) INR goal varies based on device and disease states. Performed at [facility], [address] [place], [place] [address]   Brain natriuretic peptide     Status: Abnormal   Collection Time: 08/31/18 10:04 PM  Result Value Ref Range   B Natriuretic Peptide 112.0 (H) 0.0 - 100.0 pg/mL    Comment: Performed at [facility] [address], [place], [place] [address]    Chemistries  Recent Labs  Lab 08/31/18 2203  NA 133*  K 3.7  CL 102  CO2 21*  GLUCOSE 126*  BUN 19  CREATININE 1.24  CALCIUM 8.1*  AST 14*  ALT 10  ALKPHOS 31*  BILITOT 0.7    ------------------------------------------------------------------------------------------------------------------  ------------------------------------------------------------------------------------------------------------------ GFR: Estimated Creatinine Clearance: 48 mL/min (by C-G formula based on SCr of 1.24 mg/dL). Liver Function Tests: Recent Labs  Lab 08/31/18 2203  AST 14*  ALT 10  ALKPHOS 31*  BILITOT 0.7  PROT 8.3*  ALBUMIN 2.6*   No results for input(s): LIPASE, AMYLASE in the last 168 hours. No results for input(s): AMMONIA in the last 168 hours. Coagulation Profile: Recent Labs  Lab 08/31/18 2203  INR 1.1   Cardiac Enzymes: No results for input(s): CKTOTAL, CKMB, CKMBINDEX, TROPONINI in the last 168 hours. BNP (last 3 results) No results for input(s): PROBNP in the last 8760 hours. HbA1C: No results for input(s): HGBA1C in the last 72 hours. CBG: No results for input(s): GLUCAP in the last 168 hours. Lipid Profile: No results for input(s): CHOL, HDL, LDLCALC, TRIG, CHOLHDL, LDLDIRECT in the last 72 hours. Thyroid Function Tests: No results for input(s): TSH, T4TOTAL, FREET4, T3FREE, THYROIDAB in the last 72 hours. Anemia Panel: No results for input(s): VITAMINB12, FOLATE, FERRITIN, TIBC, IRON, RETICCTPCT in the last 72 hours.  --------------------------------------------------------------------------------------------------------------- Urine analysis:    Component Value Date/Time   COLORURINE YELLOW 08/31/2018 2145   APPEARANCEUR CLEAR 08/31/2018 2145   LABSPEC 1.017 08/31/2018 2145   PHURINE 5.0 08/31/2018 2145   GLUCOSEU NEGATIVE 08/31/2018 2145   HGBUR NEGATIVE 08/31/2018 2145   BILIRUBINUR NEGATIVE 08/31/2018 2145   [place] NEGATIVE 08/31/2018 2145   PROTEINUR NEGATIVE 08/31/2018 2145   NITRITE NEGATIVE 08/31/2018 2145   LEUKOCYTESUR NEGATIVE 08/31/2018 2145  Imaging Results:    Dg Chest Portable 1 View  Result Date: 08/31/2018  CLINICAL DATA:  Shortness of breath for 2 days EXAM: PORTABLE CHEST 1 VIEW COMPARISON:  08/25/2018 FINDINGS: Cardiac shadow is stable. Right chest wall port is again seen. Diffuse fibrotic changes are again identified throughout both lungs. Some increased infiltrate in the left upper lobe is noted new from the prior exam. No sizable acute effusion is seen. No bony abnormality is noted. IMPRESSION: Acute on chronic infiltrate in the left upper lobe. Diffuse fibrotic changes are again identified. Electronically Signed   By: Inez Catalina M.D.   On: 08/31/2018 22:15    My personal review of EKG: Rhythm sinus tach at a rate of 115, QTc 422, no acute ST changes   Assessment & Plan:    Active Problems:   HCAP (healthcare-associated pneumonia)   1. Sepsis secondary to pneumonia present on admission 1. Patient meets sepsis criteria with a leukocytosis of 16.7, tachycardia, tachypnea.  Sepsis code was called.  Lactic acid 1.6.  Blood cultures drawn.  Broad-spectrum antibiotics started.  Patient was given 2 L bolus in the ED.   2. Continue maintenance fluids 3. Continue cefepime and vancomycin for pseudomonal and MRSA coverage 4. MRSA swab- if negative can de-escalate vancomycin 5. Culture sputum 6. Urine strep antigen  Hypovolemic hyponatremia  Likely to correct as volume status improves with fluids  Recheck in the morning  2. H CAP pneumonia 1. Last admission August 03, 2018.  Patient has been admitted to the hospital 5 times in the 2020-year.  Chest x-ray demonstrates acute on chronic infiltrate in the left upper lobe.  See plan as above. 3. Non-small cell lung cancer 1. Continue home regimen 4. CKD stage III 1. Likely prerenal.  Continue fluids.  Recheck in the morning.  This is not an AKI.  If creatinine remains elevated discontinue Protonix. 5. Hyperglycemia 1. Postprandial 2. Recheck glucose on morning labs. 6. Hypertension 1. Holding hypertensive medications given patient's soft blood  pressure in ED with a systolic blood pressure of 100. 7. Hyperlipidemia 1. Continue statin medication   DVT Prophylaxis-   Lovenox   AM Labs Ordered, also please review Full Orders  Family Communication: Admission, patients condition and plan of care including tests being ordered have been discussed with the patient who verbalizes understanding and agrees with the plan and Code Status.  Code Status: DNR  Admission status Inpatient: Based on patients clinical presentation and evaluation of above clinical data, I have made determination that patient meets Inpatient criteria at this time.  Time spent in minutes : 68   Demetrio Leighty B Zierle-Ghosh M.D on 09/01/2018 at 12:28 AM

## 2018-09-01 NOTE — Progress Notes (Signed)
CRITICAL VALUE ALERT  Critical Value:  Blood culture 2/4 bottles. Gram positive cocci. Staph species.   Date & Time Notied:  09/01/18 2245  Provider Notified: Olevia Bowens   Orders Received/Actions taken: No orders at this time.

## 2018-09-01 NOTE — Progress Notes (Signed)
TRH night shift coverage note.  The labs reported positive growth of non Staph Aureus a staphylococcal species resistant to methicillin.  The patient is currently on vancomycin, to which the growing bacteria is sensitive to.  Tennis Must, MD

## 2018-09-02 DIAGNOSIS — J9621 Acute and chronic respiratory failure with hypoxia: Secondary | ICD-10-CM

## 2018-09-02 DIAGNOSIS — J189 Pneumonia, unspecified organism: Secondary | ICD-10-CM

## 2018-09-02 LAB — CBC
HCT: 41.4 % (ref 39.0–52.0)
Hemoglobin: 12.9 g/dL — ABNORMAL LOW (ref 13.0–17.0)
MCH: 28.5 pg (ref 26.0–34.0)
MCHC: 31.2 g/dL (ref 30.0–36.0)
MCV: 91.4 fL (ref 80.0–100.0)
Platelets: 554 10*3/uL — ABNORMAL HIGH (ref 150–400)
RBC: 4.53 MIL/uL (ref 4.22–5.81)
RDW: 16.3 % — ABNORMAL HIGH (ref 11.5–15.5)
WBC: 12 10*3/uL — ABNORMAL HIGH (ref 4.0–10.5)
nRBC: 0 % (ref 0.0–0.2)

## 2018-09-02 LAB — HIV ANTIBODY (ROUTINE TESTING W REFLEX): HIV Screen 4th Generation wRfx: NONREACTIVE

## 2018-09-02 LAB — COMPREHENSIVE METABOLIC PANEL
ALT: 9 U/L (ref 0–44)
AST: 15 U/L (ref 15–41)
Albumin: 2.5 g/dL — ABNORMAL LOW (ref 3.5–5.0)
Alkaline Phosphatase: 23 U/L — ABNORMAL LOW (ref 38–126)
Anion gap: 8 (ref 5–15)
BUN: 14 mg/dL (ref 8–23)
CO2: 23 mmol/L (ref 22–32)
Calcium: 8.3 mg/dL — ABNORMAL LOW (ref 8.9–10.3)
Chloride: 102 mmol/L (ref 98–111)
Creatinine, Ser: 0.83 mg/dL (ref 0.61–1.24)
GFR calc Af Amer: >60 mL/min (ref >60)
GFR calc non Af Amer: >60 mL/min (ref >60)
Glucose, Bld: 100 mg/dL — ABNORMAL HIGH (ref 70–99)
Potassium: 3.7 mmol/L (ref 3.5–5.1)
Sodium: 133 mmol/L — ABNORMAL LOW (ref 135–145)
Total Bilirubin: 1.3 mg/dL — ABNORMAL HIGH (ref 0.3–1.2)
Total Protein: 8.1 g/dL (ref 6.5–8.1)

## 2018-09-02 LAB — LEGIONELLA PNEUMOPHILA SEROGP 1 UR AG: L. pneumophila Serogp 1 Ur Ag: NEGATIVE

## 2018-09-02 LAB — URINE CULTURE: Culture: NO GROWTH

## 2018-09-02 MED ORDER — ALBUTEROL SULFATE (2.5 MG/3ML) 0.083% IN NEBU
2.5000 mg | INHALATION_SOLUTION | RESPIRATORY_TRACT | Status: DC | PRN
  Administered 2018-09-03: 2.5 mg via RESPIRATORY_TRACT
  Filled 2018-09-02: qty 3

## 2018-09-02 MED ORDER — IPRATROPIUM-ALBUTEROL 0.5-2.5 (3) MG/3ML IN SOLN: 3.0000 mL | Freq: Four times a day (QID) | RESPIRATORY_TRACT | Status: DC

## 2018-09-02 MED ORDER — SODIUM CHLORIDE 0.9 % IV SOLN
INTRAVENOUS | Status: DC | PRN
  Administered 2018-09-02: 500 mL via INTRAVENOUS

## 2018-09-02 MED ORDER — SODIUM CHLORIDE 0.9 % IV SOLN
2.0000 g | Freq: Three times a day (TID) | INTRAVENOUS | Status: DC
  Administered 2018-09-02 – 2018-09-07 (×14): 2 g via INTRAVENOUS
  Filled 2018-09-02 (×14): qty 2

## 2018-09-02 NOTE — Progress Notes (Signed)
PROGRESS NOTE    Bradley [name]  WNI:[id number]  DOB: [birth year]  DOA: 08/31/2018 PCP: Jani Gravel, MD   Brief Admission Hx: 78 y.o. male, with history of non-small cell lung cancer, COPD, CAD, HLD, anemia, and hypertension who presents to the ED today via EMS for shortness of breath and recurrent HCAP.   MDM/Assessment & Plan:   1. Sepsis secondary to HCAP - clinically patient is much improved. Continue supportive therapy.  2. HCAP - continue cefepime.  DC vanc as MRSA screen negative. Coag neg staph seen in 1/2 blood culture likely contaminant.  Possibly secondary to recent Pleurx cath that was removed 08/25/18.  3. Hyponatremia - stable, following.  4. Non small cell lung cancer - he will need to follow up with oncologist after discharge. He was seen on 08/24/18. They are planning CT CAP in next week prior to appt next week.  5. S/p recent Pleuryx cath removal - will ask RN to remove sutures.  6. Essential hypertension - soft BPs, holding home antihypertensives 7. Acute hypoxic respiratory failure- he is on HFNC and asked for team to wean down.  His goal oxygen sat should be 88-92%.  He does not need to be at 100%.     DVT prophylaxis: lovenox Code Status: DNR Family Communication: patient bedside update Disposition Plan: inpatient   Consultants:    Procedures:    Antimicrobials:     Subjective: Pt less SOB this morning.  He became severely SOB when tried to get up early this morning.   Objective: Vitals:   09/02/18 0718 09/02/18 0823 09/02/18 0843 09/02/18 1121  BP:      Pulse:      Resp:      Temp:      TempSrc:      SpO2: 97% 97% 97% 97%  Weight:      Height:        Intake/Output Summary (Last 24 hours) at 09/02/2018 1326 Last data filed at 09/02/2018 1232 Gross per 24 hour  Intake 526.83 ml  Output 1850 ml  Net -1323.17 ml   Filed Weights   08/31/18 2134 08/31/18 2210 09/01/18 2005  Weight: 68 kg 68 kg 67.7 kg     REVIEW OF SYSTEMS  As per  history otherwise all reviewed and reported negative  Exam:  General exam: elderly male, awake, alert, NAD.  Respiratory system: diffuse rales. Mild increased work of breathing. Cardiovascular system: S1 & S2 heard. No JVD, murmurs, gallops, clicks or pedal edema. Gastrointestinal system: Abdomen is nondistended, soft and nontender. Normal bowel sounds heard. Central nervous system: Alert and orientedx2. No focal neurological deficits. Extremities: no CCE.  Data Reviewed: Basic Metabolic Panel: Recent Labs  Lab 08/31/18 2203 09/02/18 0917  NA 133* 133*  K 3.7 3.7  CL 102 102  CO2 21* 23  GLUCOSE 126* 100*  BUN 19 14  CREATININE 1.24 0.83  CALCIUM 8.1* 8.3*   Liver Function Tests: Recent Labs  Lab 08/31/18 2203 09/02/18 0917  AST 14* 15  ALT 10 9  ALKPHOS 31* 23*  BILITOT 0.7 1.3*  PROT 8.3* 8.1  ALBUMIN 2.6* 2.5*   No results for input(s): LIPASE, AMYLASE in the last 168 hours. No results for input(s): AMMONIA in the last 168 hours. CBC: Recent Labs  Lab 08/31/18 2203 09/02/18 0917  WBC 16.7* 12.0*  NEUTROABS 12.9*  --   HGB 13.6 12.9*  HCT 44.3 41.4  MCV 91.3 91.4  PLT 590* 554*   Cardiac Enzymes: No results  for input(s): CKTOTAL, CKMB, CKMBINDEX, TROPONINI in the last 168 hours. CBG (last 3)  No results for input(s): GLUCAP in the last 72 hours. Recent Results (from the past 240 hour(s))  Urine culture     Status: None   Collection Time: 08/31/18  9:45 PM   Specimen: In/Out Cath Urine  Result Value Ref Range Status   Specimen Description   Final    IN/OUT CATH URINE Performed at [facility], [address], [place], [place] [address]    Special Requests   Final    NONE Performed at [facility], [address], [place], [place] [address]    Culture   Final    NO GROWTH Performed at [facility], [place] [address], [place], [place] [address]    Report Status 09/02/2018 FINAL  Final  SARS Coronavirus 2 [facility] order, Performed in [facility] hospital lab) Nasopharyngeal Nasopharyngeal Swab     Status: None   Collection Time: 08/31/18  9:46 PM   Specimen: Nasopharyngeal Swab  Result Value Ref Range Status   SARS Coronavirus 2 NEGATIVE NEGATIVE Final    Comment: (NOTE) If result is NEGATIVE SARS-CoV-2 target nucleic acids are NOT DETECTED. The SARS-CoV-2 RNA is generally detectable in upper and lower  respiratory specimens during the acute phase of infection. The lowest  concentration of SARS-CoV-2 viral copies this assay can detect is 250  copies / mL. A negative result does not preclude SARS-CoV-2 infection  and should not be used as the sole basis for treatment or other  patient management decisions.  A negative result may occur with  improper specimen collection / handling, submission of specimen other  than nasopharyngeal swab, presence of viral mutation(s) within the  areas targeted by this assay, and inadequate number of viral copies  (<250 copies / mL). A negative result must be combined with clinical  observations, patient history, and epidemiological information. If result is POSITIVE SARS-CoV-2 target nucleic acids are DETECTED. The SARS-CoV-2 RNA is generally detectable in upper and lower  respiratory specimens dur ing the acute phase of infection.  Positive  results are indicative of active infection with SARS-CoV-2.  Clinical  correlation with patient history and other diagnostic information is  necessary to determine patient infection status.  Positive results do  not rule out bacterial infection or co-infection with other viruses. If result is PRESUMPTIVE POSTIVE SARS-CoV-2 nucleic acids MAY BE PRESENT.   A presumptive positive result was obtained on the submitted specimen  and confirmed on repeat testing.  While 2019 novel coronavirus  (SARS-CoV-2) nucleic acids may be present in the submitted sample  additional confirmatory testing may be necessary for epidemiological  and / or clinical management  purposes  to differentiate between  SARS-CoV-2 and other Sarbecovirus currently known to infect humans.  If clinically indicated additional testing with an alternate test  methodology [phone]) is advised. The SARS-CoV-2 RNA is generally  detectable in upper and lower respiratory sp ecimens during the acute  phase of infection. The expected result is Negative. Fact Sheet for Patients:  StrictlyIdeas.no Fact Sheet for Healthcare Providers: BankingDealers.co.za This test is not yet approved or cleared by the [place] FDA and has been authorized for detection and/or diagnosis of SARS-CoV-2 by FDA under an Emergency Use Authorization (EUA).  This EUA will remain in effect (meaning this test can be used) for the duration of the COVID-19 declaration under Section 564(b)(1) of the Act, 21 U.S.C. section 360bbb-3(b)(1), unless the authorization is terminated or  revoked sooner. Performed at [facility], [address] [place], [place] [address]   Blood Culture (routine x 2)     Status: None (Preliminary result)   Collection Time: 08/31/18 10:03 PM   Specimen: BLOOD RIGHT HAND  Result Value Ref Range Status   Specimen Description   Final    BLOOD RIGHT HAND Performed at [facility], [address], [place], [place] [address]    Special Requests   Final    BOTTLES DRAWN AEROBIC AND ANAEROBIC Blood Culture adequate volume Performed at [facility], [address], [place], [place] [address]    Culture  Setup Time   Final    GRAM POSITIVE COCCI IN BOTH AEROBIC AND ANAEROBIC BOTTLES Gram Stain Report Called to,Read Back By and Verified With: MINTER,R ON 09/01/18 AT 1740 BY LOY,C PERFORMED AT [facility] CRITICAL RESULT CALLED TO, READ BACK BY AND VERIFIED WITH: C HOPKINS RN 09/01/18 2220 JDW    Culture   Final    GRAM POSITIVE COCCI CULTURE REINCUBATED FOR BETTER GROWTH Performed at [facility], [place] [address], [place], [place] [address]     Report Status PENDING  Incomplete  Blood Culture ID Panel (Reflexed)     Status: Abnormal   Collection Time: 08/31/18 10:03 PM  Result Value Ref Range Status   Enterococcus species NOT DETECTED NOT DETECTED Final   Listeria monocytogenes NOT DETECTED NOT DETECTED Final   Staphylococcus species DETECTED (A) NOT DETECTED Final    Comment: Methicillin (oxacillin) resistant coagulase negative staphylococcus. Possible blood culture contaminant (unless isolated from more than one blood culture draw or clinical case suggests pathogenicity). No antibiotic treatment is indicated for blood  culture contaminants. CRITICAL RESULT CALLED TO, READ BACK BY AND VERIFIED WITH: C HOPKINS RN 09/01/18 2220 JDW    Staphylococcus aureus (BCID) NOT DETECTED NOT DETECTED Final   Methicillin resistance DETECTED (A) NOT DETECTED Final    Comment: CRITICAL RESULT CALLED TO, READ BACK BY AND VERIFIED WITH: C HOPKINS RN 09/01/18 2220 JDW    Streptococcus species NOT DETECTED NOT DETECTED Final   Streptococcus agalactiae NOT DETECTED NOT DETECTED Final   Streptococcus pneumoniae NOT DETECTED NOT DETECTED Final   Streptococcus pyogenes NOT DETECTED NOT DETECTED Final   Acinetobacter baumannii NOT DETECTED NOT DETECTED Final   Enterobacteriaceae species NOT DETECTED NOT DETECTED Final   Enterobacter cloacae complex NOT DETECTED NOT DETECTED Final   Escherichia coli NOT DETECTED NOT DETECTED Final   Klebsiella oxytoca NOT DETECTED NOT DETECTED Final   Klebsiella pneumoniae NOT DETECTED NOT DETECTED Final   Proteus species NOT DETECTED NOT DETECTED Final   Serratia marcescens NOT DETECTED NOT DETECTED Final   Haemophilus influenzae NOT DETECTED NOT DETECTED Final   Neisseria meningitidis NOT DETECTED NOT DETECTED Final   Pseudomonas aeruginosa NOT DETECTED NOT DETECTED Final   Candida albicans NOT DETECTED NOT DETECTED Final   Candida glabrata NOT DETECTED NOT DETECTED Final   Candida krusei NOT DETECTED NOT DETECTED  Final   Candida parapsilosis NOT DETECTED NOT DETECTED Final   Candida tropicalis NOT DETECTED NOT DETECTED Final    Comment: Performed at [facility], [place] [address], [place], [place] [address]  Blood Culture (routine x 2)     Status: None (Preliminary result)   Collection Time: 08/31/18 10:04 PM   Specimen: BLOOD RIGHT ARM  Result Value Ref Range Status   Specimen Description BLOOD RIGHT ARM  Final   Special Requests   Final    BOTTLES DRAWN  AEROBIC AND ANAEROBIC Blood Culture adequate volume   Culture   Final    NO GROWTH 2 DAYS Performed at [facility], [address] [place], [place] [address]    Report Status PENDING  Incomplete  Culture, respiratory     Status: None (Preliminary result)   Collection Time: 09/01/18 11:27 AM  Result Value Ref Range Status   Specimen Description   Final    EXPECTORATED SPUTUM Performed at [facility], [address], [place], [place] [address]    Special Requests   Final    NONE Performed at [facility], [address], [place], [place] [address]    Gram Stain   Final    FEW WBC PRESENT,BOTH PMN AND MONONUCLEAR FEW GRAM POSITIVE COCCI FEW GRAM VARIABLE ROD ABUNDANT YEAST ABUNDANT SQUAMOUS EPITHELIAL CELLS PRESENT    Culture   Final    CULTURE REINCUBATED FOR BETTER GROWTH Performed at [facility], [place] [address] [place], [place] [address]    Report Status PENDING  Incomplete  MRSA PCR Screening     Status: None   Collection Time: 09/01/18  8:39 PM   Specimen: Nasal Mucosa; Nasopharyngeal  Result Value Ref Range Status   MRSA by PCR NEGATIVE NEGATIVE Final    Comment:        The GeneXpert MRSA Assay (FDA approved for NASAL specimens only), is one component of a comprehensive MRSA colonization surveillance program. It is not intended to diagnose MRSA infection nor to guide or monitor treatment for MRSA infections. Performed at [facility], [address], [place],  [address]      Studies: Dg Chest  Portable 1 View  Result Date: 08/31/2018 CLINICAL DATA:  Shortness of breath for 2 days EXAM: PORTABLE CHEST 1 VIEW COMPARISON:  08/25/2018 FINDINGS: Cardiac shadow is stable. Right chest wall port is again seen. Diffuse fibrotic changes are again identified throughout both lungs. Some increased infiltrate in the left upper lobe is noted new from the prior exam. No sizable acute effusion is seen. No bony abnormality is noted. IMPRESSION: Acute on chronic infiltrate in the left upper lobe. Diffuse fibrotic changes are again identified. Electronically Signed   By: Inez Catalina M.D.   On: 08/31/2018 22:15     Scheduled Meds: . aspirin EC  81 mg Oral Daily  . cyclobenzaprine  5 mg Oral TID  . enoxaparin (LOVENOX) injection  40 mg Subcutaneous Q24H  . ferrous sulfate  325 mg Oral Daily  . fluticasone  2 spray Each Nare Daily  . fluticasone furoate-vilanterol  1 puff Inhalation Daily  . gabapentin  300 mg Oral TID  . ipratropium-albuterol  3 mL Nebulization Q4H  . metoCLOPramide  5 mg Oral TID AC & HS  . mirabegron ER  50 mg Oral Daily  . montelukast  10 mg Oral Daily  . multivitamin with minerals  1 tablet Oral Daily  . oxyCODONE  10 mg Oral Q4H  . pantoprazole  40 mg Oral Daily  . simvastatin  10 mg Oral QHS  . vitamin C  500 mg Oral Daily   Continuous Infusions: . sodium chloride 500 mL (09/02/18 0835)  . 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  1. Check the care team in [facility] and look for a) attending/consulting [facility] provider listed and b) the [facility] team listed 2. Log into [email or web address] and use [facility]'s universal password to access. If you do not have the  password, please contact the hospital operator. 3. Locate the [facility] provider you are looking for under [facility] and page to a number that you can be directly reached. 4. If you still have difficulty reaching the provider, please page the [facility] (Director on Call) for the Hospitalists listed on amion for assistance.

## 2018-09-02 NOTE — Progress Notes (Signed)
Received order to remove sutures from left chest tube site which he had in another facility.  No drainage or redness.  Bandaid placed

## 2018-09-03 ENCOUNTER — Inpatient Hospital Stay (HOSPITAL_COMMUNITY): Payer: Medicare Other

## 2018-09-03 DIAGNOSIS — Z720 Tobacco use: Secondary | ICD-10-CM

## 2018-09-03 DIAGNOSIS — C3492 Malignant neoplasm of unspecified part of left bronchus or lung: Secondary | ICD-10-CM

## 2018-09-03 DIAGNOSIS — J439 Emphysema, unspecified: Secondary | ICD-10-CM

## 2018-09-03 DIAGNOSIS — I10 Essential (primary) hypertension: Secondary | ICD-10-CM

## 2018-09-03 DIAGNOSIS — R918 Other nonspecific abnormal finding of lung field: Secondary | ICD-10-CM

## 2018-09-03 DIAGNOSIS — R9389 Abnormal findings on diagnostic imaging of other specified body structures: Secondary | ICD-10-CM

## 2018-09-03 LAB — COMPREHENSIVE METABOLIC PANEL
ALT: 10 U/L (ref 0–44)
AST: 14 U/L — ABNORMAL LOW (ref 15–41)
Albumin: 2.4 g/dL — ABNORMAL LOW (ref 3.5–5.0)
Alkaline Phosphatase: 23 U/L — ABNORMAL LOW (ref 38–126)
Anion gap: 9 (ref 5–15)
BUN: 15 mg/dL (ref 8–23)
CO2: 23 mmol/L (ref 22–32)
Calcium: 8.5 mg/dL — ABNORMAL LOW (ref 8.9–10.3)
Chloride: 99 mmol/L (ref 98–111)
Creatinine, Ser: 0.85 mg/dL (ref 0.61–1.24)
GFR calc Af Amer: >60 mL/min (ref >60)
GFR calc non Af Amer: >60 mL/min (ref >60)
Glucose, Bld: 101 mg/dL — ABNORMAL HIGH (ref 70–99)
Potassium: 3.7 mmol/L (ref 3.5–5.1)
Sodium: 131 mmol/L — ABNORMAL LOW (ref 135–145)
Total Bilirubin: 1 mg/dL (ref 0.3–1.2)
Total Protein: 7.8 g/dL (ref 6.5–8.1)

## 2018-09-03 LAB — CULTURE, BLOOD (ROUTINE X 2): Special Requests: ADEQUATE

## 2018-09-03 LAB — CULTURE, RESPIRATORY W GRAM STAIN: Culture: NORMAL

## 2018-09-03 LAB — CBC
HCT: 41.3 % (ref 39.0–52.0)
Hemoglobin: 13 g/dL (ref 13.0–17.0)
MCH: 28.3 pg (ref 26.0–34.0)
MCHC: 31.5 g/dL (ref 30.0–36.0)
MCV: 90 fL (ref 80.0–100.0)
Platelets: 529 10*3/uL — ABNORMAL HIGH (ref 150–400)
RBC: 4.59 MIL/uL (ref 4.22–5.81)
RDW: 16.4 % — ABNORMAL HIGH (ref 11.5–15.5)
WBC: 15 10*3/uL — ABNORMAL HIGH (ref 4.0–10.5)
nRBC: 0 % (ref 0.0–0.2)

## 2018-09-03 MED ORDER — CHLORHEXIDINE GLUCONATE CLOTH 2 % EX PADS
6.0000 | MEDICATED_PAD | Freq: Every day | CUTANEOUS | Status: DC
  Administered 2018-09-04 – 2018-09-08 (×4): 6 via TOPICAL

## 2018-09-03 MED ORDER — POTASSIUM CHLORIDE CRYS ER 20 MEQ PO TBCR
20.0000 meq | EXTENDED_RELEASE_TABLET | Freq: Once | ORAL | Status: AC
  Administered 2018-09-03: 05:00:00 20 meq via ORAL
  Filled 2018-09-03: qty 1

## 2018-09-03 MED ORDER — MAGNESIUM SULFATE 2 GM/50ML IV SOLN
2.0000 g | Freq: Once | INTRAVENOUS | Status: AC
  Administered 2018-09-03: 2 g via INTRAVENOUS
  Filled 2018-09-03: qty 50

## 2018-09-03 MED ORDER — METHYLPREDNISOLONE SODIUM SUCC 125 MG IJ SOLR
60.0000 mg | Freq: Four times a day (QID) | INTRAMUSCULAR | Status: DC
  Administered 2018-09-03 – 2018-09-09 (×24): 60 mg via INTRAVENOUS
  Filled 2018-09-03 (×24): qty 2

## 2018-09-03 MED ORDER — METHYLPREDNISOLONE SODIUM SUCC 125 MG IJ SOLR
125.0000 mg | Freq: Once | INTRAMUSCULAR | Status: AC
  Administered 2018-09-03: 125 mg via INTRAVENOUS
  Filled 2018-09-03: qty 2

## 2018-09-03 MED ORDER — LORAZEPAM 2 MG/ML IJ SOLN
0.5000 mg | INTRAMUSCULAR | Status: DC | PRN
  Administered 2018-09-04 – 2018-09-08 (×3): 0.5 mg via INTRAVENOUS
  Filled 2018-09-03 (×3): qty 1

## 2018-09-03 MED ORDER — IPRATROPIUM BROMIDE 0.02 % IN SOLN
0.5000 mg | Freq: Four times a day (QID) | RESPIRATORY_TRACT | Status: DC
  Administered 2018-09-03 – 2018-09-08 (×23): 0.5 mg via RESPIRATORY_TRACT
  Filled 2018-09-03 (×23): qty 2.5

## 2018-09-03 MED ORDER — LORAZEPAM 2 MG/ML IJ SOLN
0.5000 mg | Freq: Once | INTRAMUSCULAR | Status: AC
  Administered 2018-09-03: 0.5 mg via INTRAVENOUS
  Filled 2018-09-03: qty 1

## 2018-09-03 MED ORDER — LEVALBUTEROL HCL 1.25 MG/0.5ML IN NEBU: 1.2500 mg | INHALATION_SOLUTION | Freq: Once | RESPIRATORY_TRACT | Status: DC

## 2018-09-03 MED ORDER — LEVALBUTEROL HCL 1.25 MG/0.5ML IN NEBU
1.2500 mg | INHALATION_SOLUTION | Freq: Four times a day (QID) | RESPIRATORY_TRACT | Status: DC
  Administered 2018-09-03 – 2018-09-08 (×23): 1.25 mg via RESPIRATORY_TRACT
  Filled 2018-09-03 (×23): qty 0.5

## 2018-09-03 NOTE — Progress Notes (Signed)
PT Cancellation Note  Patient Details Name: Bradley [name] MRN: [id number] DOB: 03-21-1940   Cancelled Treatment:    Reason Eval/Treat Not Completed: Medical issues which prohibited therapy.  Patient transferred to a higher level of care and will need new PT consult resume therapy when patient is medically stable.  Thank you.    11:20 AM, 09/03/18 Lonell Grandchild, MPT Physical Therapist with [facility] [phone] office [phone] mobile phone

## 2018-09-03 NOTE — Consult Note (Signed)
Consult requested by: [facility] hospitalist, Dr. Wynetta Emery Consult requested for: Respiratory distress  HPI: This is a 78 year old who is known to have non-small cell lung cancer, COPD, coronary artery occlusive disease, hyperlipidemia, anemia, hypertension.  He was admitted to the hospital after he was at home eating and his daughter said he did not look good and called EMS.  He has been progressively more short of breath.  He has been on oxygen at home.  He came to the emergency department was noted to be hypoxic and required 7 L to maintain oxygen.  He says he has been coughing but it is basically nonproductive.  He is short of breath.  He does not want to be intubated or placed on mechanical ventilation.  He has diffuse pulmonary fibrosis by chest x-ray which I have personally reviewed.  As per the radiologist he could easily have pneumonia that we cannot see that is obscured by his fibrosis.  He is continuing to smoke.  He has metastatic non-small cell lung cancer and he has been on treatment for that from our cancer center.  History is mostly from the medical record because he is extremely short of breath on nonrebreather mask.  He became acutely worse last night and this morning with hypoxia and increased work of breathing and he has been transferred to stepdown unit. Past Medical History:  Diagnosis Date  . Cancer ([facility])    lung  . Chronic lower back pain   . Chronic pain    pain management  . Contact with stonefish as cause of accidental injury 1954   "stung across my left hand in [place]; LUE weaker since"  . COPD (chronic obstructive pulmonary disease) ([facility])   . Coronary artery disease   . Dyspnea    with exertion  . High cholesterol   . History of blood transfusion 1943  . History of kidney stones    passed  . Hypertension   . Mass of left lung   . Migraine    "none since the 1990s" (09/11/2016)  . Myocardial infarction ([facility]) 01/20/1993; 02/28/1993  . Neuromuscular disorder ([facility])     neuropathy left  . On home O2   . Pneumonia    "several times" (09/11/2016)  . Tobacco abuse    ongoing     Family History  Adopted: Yes  Problem Relation Age of Onset  . Cancer Mother   . Obesity Father      Social History   Socioeconomic History  . Marital status: Widowed    Spouse name: Not on file  . Number of children: Not on file  . Years of education: Not on file  . Highest education level: Not on file  Occupational History  . Not on file  Social Needs  . Financial resource strain: Patient refused  . Food insecurity    Worry: Patient refused    Inability: Patient refused  . Transportation needs    Medical: Patient refused    Non-medical: Patient refused  Tobacco Use  . Smoking status: Current Every Day Smoker    Packs/day: 1.00    Years: 70.00    Pack years: 70.00    Types: Cigarettes  . Smokeless tobacco: Never Used  . Tobacco comment: smokes 8 a day  Substance and Sexual Activity  . Alcohol use: No    Comment: 11/26/2017  "drank from the age of 8 til 03/05/1993"  . Drug use: Not Currently    Comment: 11/26/2017 "used some drugs when I was  young"  . Sexual activity: Not Currently  Lifestyle  . Physical activity    Days per week: Patient refused    Minutes per session: Patient refused  . Stress: To some extent  Relationships  . Social Herbalist on phone: Patient refused    Gets together: Patient refused    Attends religious service: Patient refused    Active member of club or organization: Patient refused    Attends meetings of clubs or organizations: Patient refused    Relationship status: Patient refused  Other Topics Concern  . Not on file  Social History Narrative  . Not on file     ROS: Not obtainable considering his current condition    Objective: Vital signs in last 24 hours: Temp:  [98.1 F (36.7 C)-98.4 F (36.9 C)] 98.4 F (36.9 C) (08/07 0735) Pulse Rate:  [98-117] 106 (08/07 0735) Resp:  [18-28] 28 (08/07  0735) BP: (109-146)/(79-82) 146/80 (08/07 0735) SpO2:  [89 %-97 %] 90 % (08/07 0735) FiO2 (%):  [0 %] 0 % (08/06 0823) Weight change:  Last BM Date: 08/30/18  Intake/Output from previous day: 08/06 0701 - 08/07 0700 In: 1017 [P.O.:720; I.V.:164.6; IV Piggyback:132.4] Out: 500 [Urine:500]  PHYSICAL EXAM Constitutional: He is an elderly thin male in moderate distress.  He is wearing a nonrebreather mask.  Eyes: Pupils react EOMI.  Ears nose mouth and throat: Mucous membranes are dry.  Exam is limited by his respiratory distress.  Cardiovascular: His heart is regular with tachycardia.  Heart sounds are obscured.  Respiratory: He has marked bilateral rales.  Gastrointestinal: His abdomen soft.  Musculoskeletal: Grossly normal strength with some decrease in strength left upper extremity.  Neurological: No focal abnormalities.  Psychiatric: He is anxious  Lab Results: Basic Metabolic Panel: Recent Labs    09/02/18 0917 09/03/18 0512  NA 133* 131*  K 3.7 3.7  CL 102 99  CO2 23 23  GLUCOSE 100* 101*  BUN 14 15  CREATININE 0.83 0.85  CALCIUM 8.3* 8.5*   Liver Function Tests: Recent Labs    09/02/18 0917 09/03/18 0512  AST 15 14*  ALT 9 10  ALKPHOS 23* 23*  BILITOT 1.3* 1.0  PROT 8.1 7.8  ALBUMIN 2.5* 2.4*   No results for input(s): LIPASE, AMYLASE in the last 72 hours. No results for input(s): AMMONIA in the last 72 hours. CBC: Recent Labs    08/31/18 2203 09/02/18 0917 09/03/18 0512  WBC 16.7* 12.0* 15.0*  NEUTROABS 12.9*  --   --   HGB 13.6 12.9* 13.0  HCT 44.3 41.4 41.3  MCV 91.3 91.4 90.0  PLT 590* 554* 529*   Cardiac Enzymes: No results for input(s): CKTOTAL, CKMB, CKMBINDEX, TROPONINI in the last 72 hours. BNP: No results for input(s): PROBNP in the last 72 hours. D-Dimer: No results for input(s): DDIMER in the last 72 hours. CBG: No results for input(s): GLUCAP in the last 72 hours. Hemoglobin A1C: No results for input(s): HGBA1C in the last 72  hours. Fasting Lipid Panel: No results for input(s): CHOL, HDL, LDLCALC, TRIG, CHOLHDL, LDLDIRECT in the last 72 hours. Thyroid Function Tests: No results for input(s): TSH, T4TOTAL, FREET4, T3FREE, THYROIDAB in the last 72 hours. Anemia Panel: No results for input(s): VITAMINB12, FOLATE, FERRITIN, TIBC, IRON, RETICCTPCT in the last 72 hours. Coagulation: Recent Labs    08/31/18 2203  LABPROT 14.0  INR 1.1   Urine Drug Screen: Drugs of Abuse  No results found for: LABOPIA, COCAINSCRNUR, LABBENZ,  AMPHETMU, THCU, LABBARB  Alcohol Level: No results for input(s): ETH in the last 72 hours. Urinalysis: Recent Labs    08/31/18 2145  COLORURINE YELLOW  LABSPEC 1.017  PHURINE 5.0  GLUCOSEU NEGATIVE  HGBUR NEGATIVE  BILIRUBINUR NEGATIVE  KETONESUR NEGATIVE  PROTEINUR NEGATIVE  NITRITE NEGATIVE  LEUKOCYTESUR NEGATIVE   Misc. Labs:   ABGS: Recent Labs    09/01/18 0720  PHART 7.399  PO2ART 54.7*  HCO3 23.4     MICROBIOLOGY: Recent Results (from the past 240 hour(s))  Urine culture     Status: None   Collection Time: 08/31/18  9:45 PM   Specimen: In/Out Cath Urine  Result Value Ref Range Status   Specimen Description   Final    IN/OUT CATH URINE Performed at [facility], [address], [place], [place] [address]    Special Requests   Final    NONE Performed at [facility], [address], [place], [place] [address]    Culture   Final    NO GROWTH Performed at [facility], [place] [address] [place],  [address]    Report Status 09/02/2018 FINAL  Final  SARS Coronavirus 2 [facility] order, Performed in [facility] hospital lab) Nasopharyngeal Nasopharyngeal Swab     Status: None   Collection Time: 08/31/18  9:46 PM   Specimen: Nasopharyngeal Swab  Result Value Ref Range Status   SARS Coronavirus 2 NEGATIVE NEGATIVE Final    Comment: (NOTE) If result is NEGATIVE SARS-CoV-2 target nucleic acids are NOT DETECTED. The SARS-CoV-2 RNA is generally  detectable in upper and lower  respiratory specimens during the acute phase of infection. The lowest  concentration of SARS-CoV-2 viral copies this assay can detect is 250  copies / mL. A negative result does not preclude SARS-CoV-2 infection  and should not be used as the sole basis for treatment or other  patient management decisions.  A negative result may occur with  improper specimen collection / handling, submission of specimen other  than nasopharyngeal swab, presence of viral mutation(s) within the  areas targeted by this assay, and inadequate number of viral copies  (<250 copies / mL). A negative result must be combined with clinical  observations, patient history, and epidemiological information. If result is POSITIVE SARS-CoV-2 target nucleic acids are DETECTED. The SARS-CoV-2 RNA is generally detectable in upper and lower  respiratory specimens dur ing the acute phase of infection.  Positive  results are indicative of active infection with SARS-CoV-2.  Clinical  correlation with patient history and other diagnostic information is  necessary to determine patient infection status.  Positive results do  not rule out bacterial infection or co-infection with other viruses. If result is PRESUMPTIVE POSTIVE SARS-CoV-2 nucleic acids MAY BE PRESENT.   A presumptive positive result was obtained on the submitted specimen  and confirmed on repeat testing.  While 2019 novel coronavirus  (SARS-CoV-2) nucleic acids may be present in the submitted sample  additional confirmatory testing may be necessary for epidemiological  and / or clinical management purposes  to differentiate between  SARS-CoV-2 and other Sarbecovirus currently known to infect humans.  If clinically indicated additional testing with an alternate test  methodology [phone]) is advised. The SARS-CoV-2 RNA is generally  detectable in upper and lower respiratory sp ecimens during the acute  phase of infection. The  expected result is Negative. Fact Sheet for Patients:  StrictlyIdeas.no Fact Sheet for Healthcare Providers: BankingDealers.co.za This test is not yet approved or cleared by the  [place] Architectural technologist and has been authorized for detection and/or diagnosis of SARS-CoV-2 by FDA under an Print production planner (EUA).  This EUA will remain in effect (meaning this test can be used) for the duration of the COVID-19 declaration under Section 564(b)(1) of the Act, 21 U.S.C. section 360bbb-3(b)(1), unless the authorization is terminated or revoked sooner. Performed at [facility], [address], [place], [place] [address]   Blood Culture (routine x 2)     Status: None (Preliminary result)   Collection Time: 08/31/18 10:03 PM   Specimen: BLOOD RIGHT HAND  Result Value Ref Range Status   Specimen Description   Final    BLOOD RIGHT HAND Performed at [facility], [address] [place], [place] [address]    Special Requests   Final    BOTTLES DRAWN AEROBIC AND ANAEROBIC Blood Culture adequate volume Performed at [facility], [address] [place], [place] [address]    Culture  Setup Time   Final    GRAM POSITIVE COCCI IN BOTH AEROBIC AND ANAEROBIC BOTTLES Gram Stain Report Called to,Read Back By and Verified With: MINTER,R ON 09/01/18 AT 1740 BY LOY,C PERFORMED AT [facility] CRITICAL RESULT CALLED TO, READ BACK BY AND VERIFIED WITH: C HOPKINS RN 09/01/18 2220 JDW Performed at [facility], [place] [address], [place], [place] [address]    Culture GRAM POSITIVE COCCI  Final   Report Status PENDING  Incomplete  Blood Culture ID Panel (Reflexed)     Status: Abnormal   Collection Time: 08/31/18 10:03 PM  Result Value Ref Range Status   Enterococcus species NOT DETECTED NOT DETECTED Final   Listeria monocytogenes NOT DETECTED NOT DETECTED Final   Staphylococcus species DETECTED (A) NOT DETECTED Final    Comment: Methicillin (oxacillin) resistant  coagulase negative staphylococcus. Possible blood culture contaminant (unless isolated from more than one blood culture draw or clinical case suggests pathogenicity). No antibiotic treatment is indicated for blood  culture contaminants. CRITICAL RESULT CALLED TO, READ BACK BY AND VERIFIED WITH: C HOPKINS RN 09/01/18 2220 JDW    Staphylococcus aureus (BCID) NOT DETECTED NOT DETECTED Final   Methicillin resistance DETECTED (A) NOT DETECTED Final    Comment: CRITICAL RESULT CALLED TO, READ BACK BY AND VERIFIED WITH: C HOPKINS RN 09/01/18 2220 JDW    Streptococcus species NOT DETECTED NOT DETECTED Final   Streptococcus agalactiae NOT DETECTED NOT DETECTED Final   Streptococcus pneumoniae NOT DETECTED NOT DETECTED Final   Streptococcus pyogenes NOT DETECTED NOT DETECTED Final   Acinetobacter baumannii NOT DETECTED NOT DETECTED Final   Enterobacteriaceae species NOT DETECTED NOT DETECTED Final   Enterobacter cloacae complex NOT DETECTED NOT DETECTED Final   Escherichia coli NOT DETECTED NOT DETECTED Final   Klebsiella oxytoca NOT DETECTED NOT DETECTED Final   Klebsiella pneumoniae NOT DETECTED NOT DETECTED Final   Proteus species NOT DETECTED NOT DETECTED Final   Serratia marcescens NOT DETECTED NOT DETECTED Final   Haemophilus influenzae NOT DETECTED NOT DETECTED Final   Neisseria meningitidis NOT DETECTED NOT DETECTED Final   Pseudomonas aeruginosa NOT DETECTED NOT DETECTED Final   Candida albicans NOT DETECTED NOT DETECTED Final   Candida glabrata NOT DETECTED NOT DETECTED Final   Candida krusei NOT DETECTED NOT DETECTED Final   Candida parapsilosis NOT DETECTED NOT DETECTED Final   Candida tropicalis NOT DETECTED NOT DETECTED Final    Comment: Performed at [facility], [place] [address], [place], [place] [address]  Blood Culture (routine x 2)  Status: None (Preliminary result)   Collection Time: 08/31/18 10:04 PM   Specimen: BLOOD RIGHT ARM  Result Value Ref Range Status    Specimen Description BLOOD RIGHT ARM  Final   Special Requests   Final    BOTTLES DRAWN AEROBIC AND ANAEROBIC Blood Culture adequate volume   Culture   Final    NO GROWTH 3 DAYS Performed at [facility], [address] [place], [place] [address]    Report Status PENDING  Incomplete  Culture, respiratory     Status: None (Preliminary result)   Collection Time: 09/01/18 11:27 AM  Result Value Ref Range Status   Specimen Description   Final    EXPECTORATED SPUTUM Performed at [facility], [address] [place], [place] [address]    Special Requests   Final    NONE Performed at [facility], [address], [place], [place] [address]    Gram Stain   Final    FEW WBC PRESENT,BOTH PMN AND MONONUCLEAR FEW GRAM POSITIVE COCCI FEW GRAM VARIABLE ROD ABUNDANT YEAST ABUNDANT SQUAMOUS EPITHELIAL CELLS PRESENT    Culture   Final    CULTURE REINCUBATED FOR BETTER GROWTH Performed at [facility], [place] [address], [place], [place] [address]    Report Status PENDING  Incomplete  MRSA PCR Screening     Status: None   Collection Time: 09/01/18  8:39 PM   Specimen: Nasal Mucosa; Nasopharyngeal  Result Value Ref Range Status   MRSA by PCR NEGATIVE NEGATIVE Final    Comment:        The GeneXpert MRSA Assay (FDA approved for NASAL specimens only), is one component of a comprehensive MRSA colonization surveillance program. It is not intended to diagnose MRSA infection nor to guide or monitor treatment for MRSA infections. Performed at [facility], [address], [place], [place] [address]     Studies/Results: Dg Chest Port 1 View  Result Date: 09/03/2018 CLINICAL DATA:  Pneumonia. EXAM: PORTABLE CHEST 1 VIEW COMPARISON:  Three days ago FINDINGS: Coarse interstitial opacities with mildly low lung volumes. 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 There is history of pneumonia which could be easily obscured. Chronic costophrenic sulcus blunting. No pneumothorax. Right subclavian porta  catheter in good position. IMPRESSION: Stable from 3 days ago. Chronic fibrotic and emphysematous lung disease with possible superimposed pneumonia Electronically Signed   By: Monte Fantasia M.D.   On: 09/03/2018 05:59    Medications:  Prior to Admission:  Medications Prior to Admission  Medication Sig Dispense Refill Last Dose  . ADVAIR HFA 115-21 MCG/ACT inhaler Inhale 2 puffs into the lungs 2 (two) times daily. 1 Inhaler 4 08/31/2018 at Unknown time  . amLODipine (NORVASC) 5 MG tablet TAKE 1 TABLET BY MOUTH EVERY DAY (Patient taking differently: Take 5 mg by mouth daily. ) 90 tablet 1 08/31/2018 at Unknown time  . Ascorbic Acid (VITAMIN C) 500 MG CAPS Take 500 mg by mouth daily.   08/31/2018 at Unknown time  . aspirin EC 81 MG tablet Take 81 mg by mouth daily.    08/31/2018 at Unknown time  . cyclobenzaprine (FLEXERIL) 5 MG tablet Take 5 mg by mouth 3 (three) times daily.    08/31/2018 at Unknown time  . Ferrous Sulfate (IRON) 325 (65 Fe) MG TABS Take 1 tablet by mouth at bedtime.    08/31/2018 at Unknown time  . fluticasone (FLONASE) 50 MCG/ACT nasal spray Place 2 sprays into both nostrils daily.  6 08/31/2018 at Unknown  time  . furosemide (LASIX) 20 MG tablet Take 1 tablet (20 mg total) by mouth daily. 10 tablet 0 08/31/2018 at Unknown time  . gabapentin (NEURONTIN) 300 MG capsule Take 1 capsule by mouth 3 (three) times daily.  2 08/31/2018 at Unknown time  . ipratropium-albuterol (DUONEB) 0.5-2.5 (3) MG/3ML SOLN Take 3 mLs by nebulization every 6 (six) hours as needed (for shortness of breath).   08/31/2018 at Unknown time  . lidocaine-prilocaine (EMLA) cream Apply 1 application topically as needed. Apply a small amount to port site and cover with plastic wrap 1 hour prior to infusion appointments 30 g 3 Past Week at Unknown time  . lisinopril (ZESTRIL) 20 MG tablet Take 1 tablet (20 mg total) by mouth daily.   08/31/2018 at Unknown time  . metoCLOPramide (REGLAN) 5 MG tablet Take 1 tablet by mouth 4 (four)  times daily -  before meals and at bedtime. Daily as needed  5 Past Month at Unknown time  . metoprolol succinate (TOPROL-XL) 25 MG 24 hr tablet Take 25 mg by mouth daily.   08/31/2018 at 0800  . Multiple Vitamins-Minerals (MULTIVITAMIN WITH MINERALS) tablet Take 1 tablet by mouth daily.   08/31/2018 at Unknown time  . MYRBETRIQ 50 MG TB24 tablet Take 1 tablet by mouth daily.   08/31/2018 at Unknown time  . nitroGLYCERIN (NITROSTAT) 0.4 MG SL tablet Place 0.4 mg under the tongue every 5 (five) minutes as needed for chest pain.   unknown  . Nivolumab (OPDIVO IV) Inject into the vein every 14 (fourteen) days.   Past Month at Unknown time  . Oxycodone HCl 10 MG TABS Take 1 tablet by mouth every 4 (four) hours.    08/31/2018 at Unknown time  . potassium gluconate (HM POTASSIUM) 595 (99 K) MG TABS tablet Take 595 mg by mouth daily.   08/31/2018 at Unknown time  . PRILOSEC 40 MG capsule Take 1 capsule by mouth daily.   08/31/2018 at Unknown time  . simvastatin (ZOCOR) 10 MG tablet Take 10 mg by mouth at bedtime.   3 08/31/2018 at Unknown time  . VENTOLIN HFA 108 (90 Base) MCG/ACT inhaler Inhale 2 puffs into the lungs every 4 (four) hours as needed for wheezing or shortness of breath. 18 g 5 Past Month at Unknown time  . Ipilimumab (YERVOY IV) Inject into the vein every 6 (six) weeks.   Unknown at Unknown time  . polyethylene glycol (MIRALAX / GLYCOLAX) 17 g packet Take 17 g by mouth daily as needed for mild constipation. 14 each 0 Unknown at Unknown time   Scheduled: . aspirin EC  81 mg Oral Daily  . cyclobenzaprine  5 mg Oral TID  . enoxaparin (LOVENOX) injection  40 mg Subcutaneous Q24H  . ferrous sulfate  325 mg Oral Daily  . fluticasone  2 spray Each Nare Daily  . fluticasone furoate-vilanterol  1 puff Inhalation Daily  . gabapentin  300 mg Oral TID  . ipratropium  0.5 mg Nebulization Q6H  . levalbuterol  1.25 mg Nebulization Q6H  . methylPREDNISolone (SOLU-MEDROL) injection  60 mg Intravenous Q6H  .  metoCLOPramide  5 mg Oral TID AC & HS  . mirabegron ER  50 mg Oral Daily  . montelukast  10 mg Oral Daily  . multivitamin with minerals  1 tablet Oral Daily  . oxyCODONE  10 mg Oral Q4H  . pantoprazole  40 mg Oral Daily  . simvastatin  10 mg Oral QHS  . vitamin C  500 mg Oral Daily   Continuous: . sodium chloride Stopped (09/03/18 0532)  . ceFEPime (MAXIPIME) IV Stopped (09/03/18 0008)   [id number] chloride, albuterol, ipratropium-albuterol, polyethylene glycol  Assesment: He was admitted with presumed healthcare associated pneumonia.  He had done well and then deteriorated and has been transferred to stepdown.  I discussed the possibility of BiPAP with him.  He does not want to be intubated or have resuscitation.  He has extensive pulmonary fibrosis on his chest x-ray which I have personally reviewed  He has COPD at baseline  He has lung cancer which is metastatic  He is acutely severely ill.  High risk for not surviving this episode. Active Problems:   HCAP (healthcare-associated pneumonia)   Acute and chronic respiratory failure with hypoxia ([place])    Plan: Add IV steroids.  BiPAP if needed.  Continue other treatments.    LOS: 2 days   Alonza Bogus 09/03/2018, 8:08 AM

## 2018-09-03 NOTE — Progress Notes (Signed)
PROGRESS NOTE    Bradley [name]  WER:[id number]  DOB: [birth year]  DOA: 08/31/2018 PCP: Jani Gravel, MD   Brief Admission Hx: 78 y.o. male, with history of non-small cell lung cancer, COPD, CAD, HLD, anemia, and hypertension who presents to the ED today via EMS for shortness of breath and recurrent HCAP.   MDM/Assessment & Plan:   1. Sepsis secondary to HCAP -patient had initially improved but now has taken a turn for the worst and having worsening respiratory distress, transferred to the stepdown unit and started on IV steroids.  2. HCAP - continue cefepime.  DC vanc as MRSA screen negative. Coag neg staph seen in 1/2 blood culture likely contaminant.   Pleurx cath that was removed 08/25/18 for a malignant pleural effusion that had resolved.  3. Hyponatremia - stable, following.  4. Non small cell lung cancer - he will need to follow up with oncologist after discharge. He was seen on 08/24/18. They are planning CT CAP in next week prior to appt next week.  5. S/p recent Pleuryx cath removal - will ask RN to remove sutures.  6. Essential hypertension - soft BPs, holding home antihypertensives 7. Acute hypoxic respiratory failure-he had initially started showing improvement but had taken a turn for the worse early this morning and now transferred to the stepdown ICU and started on IV steroids.  Appreciate pulmonary consultation and assistance from Dr. Luan Pulling.  Patient may need BiPAP in the near future.  He does not want to be intubated.  I confirmed with his daughter.  DVT prophylaxis: lovenox Code Status: DNR Family Communication: patient bedside update/daughter updated telephone Disposition Plan: inpatient  Consultants:  Pulmonology  Procedures:    Antimicrobials:  Cefepime  Subjective: Pt had increasing shortness of breath and respiratory distress and was transferred to stepdown ICU.  Coughing up bloody tinged sputum.  Objective: Vitals:   09/03/18 1000 09/03/18 1040  09/03/18 1100 09/03/18 1212  BP:      Pulse: (!) 104  (!) 109   Resp: 18  (!) 26   Temp:  97.6 F (36.4 C)  98.5 F (36.9 C)  TempSrc:  Axillary  Oral  SpO2: 93%  94%   Weight:      Height:        Intake/Output Summary (Last 24 hours) at 09/03/2018 1245 Last data filed at 09/03/2018 1100 Gross per 24 hour  Intake 651.29 ml  Output 420 ml  Net 231.29 ml   Filed Weights   08/31/18 2134 08/31/18 2210 09/01/18 2005  Weight: 68 kg 68 kg 67.7 kg     REVIEW OF SYSTEMS  As per history otherwise all reviewed and reported negative  Exam:  General exam: elderly male, awake, alert, NAD.  Coughing with blood-tinged sputum seen. Respiratory system: diffuse rales.  Moderate increased work of breathing with tachypnea. Cardiovascular system: S1 & S2 heard. No JVD, murmurs, gallops, clicks or pedal edema. Gastrointestinal system: Abdomen is nondistended, soft and nontender. Normal bowel sounds heard. Central nervous system: Alert and orientedx2. No focal neurological deficits. Extremities: no CCE.  Data Reviewed: Basic Metabolic Panel: Recent Labs  Lab 08/31/18 2203 09/02/18 0917 09/03/18 0512  NA 133* 133* 131*  K 3.7 3.7 3.7  CL 102 102 99  CO2 21* 23 23  GLUCOSE 126* 100* 101*  BUN 19 14 15   CREATININE 1.24 0.83 0.85  CALCIUM 8.1* 8.3* 8.5*   Liver Function Tests: Recent Labs  Lab 08/31/18 2203 09/02/18 0917 09/03/18 0512  AST 14* 15  14*  ALT 10 9 10   ALKPHOS 31* 23* 23*  BILITOT 0.7 1.3* 1.0  PROT 8.3* 8.1 7.8  ALBUMIN 2.6* 2.5* 2.4*   No results for input(s): LIPASE, AMYLASE in the last 168 hours. No results for input(s): AMMONIA in the last 168 hours. CBC: Recent Labs  Lab 08/31/18 2203 09/02/18 0917 09/03/18 0512  WBC 16.7* 12.0* 15.0*  NEUTROABS 12.9*  --   --   HGB 13.6 12.9* 13.0  HCT 44.3 41.4 41.3  MCV 91.3 91.4 90.0  PLT 590* 554* 529*   Cardiac Enzymes: No results for input(s): CKTOTAL, CKMB, CKMBINDEX, TROPONINI in the last 168 hours. CBG  (last 3)  No results for input(s): GLUCAP in the last 72 hours. Recent Results (from the past 240 hour(s))  Urine culture     Status: None   Collection Time: 08/31/18  9:45 PM   Specimen: In/Out Cath Urine  Result Value Ref Range Status   Specimen Description   Final    IN/OUT CATH URINE Performed at [facility], [address], [place], [place] [address]    Special Requests   Final    NONE Performed at [facility], [address] [place], [place] [address]    Culture   Final    NO GROWTH Performed at [facility], [place] [address], [place], [place] [address]    Report Status 09/02/2018 FINAL  Final  SARS Coronavirus 2 [facility] order, Performed in [facility] hospital lab) Nasopharyngeal Nasopharyngeal Swab     Status: None   Collection Time: 08/31/18  9:46 PM   Specimen: Nasopharyngeal Swab  Result Value Ref Range Status   SARS Coronavirus 2 NEGATIVE NEGATIVE Final    Comment: (NOTE) If result is NEGATIVE SARS-CoV-2 target nucleic acids are NOT DETECTED. The SARS-CoV-2 RNA is generally detectable in upper and lower  respiratory specimens during the acute phase of infection. The lowest  concentration of SARS-CoV-2 viral copies this assay can detect is 250  copies / mL. A negative result does not preclude SARS-CoV-2 infection  and should not be used as the sole basis for treatment or other  patient management decisions.  A negative result may occur with  improper specimen collection / handling, submission of specimen other  than nasopharyngeal swab, presence of viral mutation(s) within the  areas targeted by this assay, and inadequate number of viral copies  (<250 copies / mL). A negative result must be combined with clinical  observations, patient history, and epidemiological information. If result is POSITIVE SARS-CoV-2 target nucleic acids are DETECTED. The SARS-CoV-2 RNA is generally detectable in upper and lower  respiratory specimens dur ing the acute phase of  infection.  Positive  results are indicative of active infection with SARS-CoV-2.  Clinical  correlation with patient history and other diagnostic information is  necessary to determine patient infection status.  Positive results do  not rule out bacterial infection or co-infection with other viruses. If result is PRESUMPTIVE POSTIVE SARS-CoV-2 nucleic acids MAY BE PRESENT.   A presumptive positive result was obtained on the submitted specimen  and confirmed on repeat testing.  While 2019 novel coronavirus  (SARS-CoV-2) nucleic acids may be present in the submitted sample  additional confirmatory testing may be necessary for epidemiological  and / or clinical management purposes  to differentiate between  SARS-CoV-2 and other Sarbecovirus currently known to infect humans.  If clinically indicated additional testing with an alternate test  methodology [phone]) is advised. The SARS-CoV-2 RNA is generally  detectable in upper and lower respiratory sp ecimens during the acute  phase of infection. The expected result is Negative. Fact Sheet for Patients:  StrictlyIdeas.no Fact Sheet for Healthcare Providers: BankingDealers.co.za This test is not yet approved or cleared by the [place] FDA and has been authorized for detection and/or diagnosis of SARS-CoV-2 by FDA under an Emergency Use Authorization (EUA).  This EUA will remain in effect (meaning this test can be used) for the duration of the COVID-19 declaration under Section 564(b)(1) of the Act, 21 U.S.C. section 360bbb-3(b)(1), unless the authorization is terminated or revoked sooner. Performed at [facility], [address], [place], [place] [address]   Blood Culture (routine x 2)     Status: Abnormal   Collection Time: 08/31/18 10:03 PM   Specimen: BLOOD RIGHT HAND  Result Value Ref Range Status   Specimen Description   Final    BLOOD RIGHT HAND Performed at [facility], [address], [place], [place] [address]    Special Requests   Final    BOTTLES DRAWN AEROBIC AND ANAEROBIC Blood Culture adequate volume Performed at [facility], [address] [place], [place] [address]    Culture  Setup Time   Final    GRAM POSITIVE COCCI IN BOTH AEROBIC AND ANAEROBIC BOTTLES Gram Stain Report Called to,Read Back By and Verified With: MINTER,R ON 09/01/18 AT 12 BY LOY,C PERFORMED AT [facility] CRITICAL RESULT CALLED TO, READ BACK BY AND VERIFIED WITH: C HOPKINS RN 09/01/18 2220 JDW    Culture (A)  Final    STAPHYLOCOCCUS SPECIES (COAGULASE NEGATIVE) THE SIGNIFICANCE OF ISOLATING THIS ORGANISM FROM A SINGLE SET OF BLOOD CULTURES WHEN MULTIPLE SETS ARE DRAWN IS UNCERTAIN. PLEASE NOTIFY THE MICROBIOLOGY DEPARTMENT WITHIN ONE WEEK IF SPECIATION AND SENSITIVITIES ARE REQUIRED. Performed at [facility], [place] [address] [place], [place] [address]    Report Status 09/03/2018 FINAL  Final  Blood Culture ID Panel (Reflexed)     Status: Abnormal   Collection Time: 08/31/18 10:03 PM  Result Value Ref Range Status   Enterococcus species NOT DETECTED NOT DETECTED Final   Listeria monocytogenes NOT DETECTED NOT DETECTED Final   Staphylococcus species DETECTED (A) NOT DETECTED Final    Comment: Methicillin (oxacillin) resistant coagulase negative staphylococcus. Possible blood culture contaminant (unless isolated from more than one blood culture draw or clinical case suggests pathogenicity). No antibiotic treatment is indicated for blood  culture contaminants. CRITICAL RESULT CALLED TO, READ BACK BY AND VERIFIED WITH: C HOPKINS RN 09/01/18 2220 JDW    Staphylococcus aureus (BCID) NOT DETECTED NOT DETECTED Final   Methicillin resistance DETECTED (A) NOT DETECTED Final    Comment: CRITICAL RESULT CALLED TO, READ BACK BY AND VERIFIED WITH: C HOPKINS RN 09/01/18 2220 JDW    Streptococcus species NOT DETECTED NOT DETECTED Final   Streptococcus agalactiae NOT DETECTED NOT DETECTED  Final   Streptococcus pneumoniae NOT DETECTED NOT DETECTED Final   Streptococcus pyogenes NOT DETECTED NOT DETECTED Final   Acinetobacter baumannii NOT DETECTED NOT DETECTED Final   Enterobacteriaceae species NOT DETECTED NOT DETECTED Final   Enterobacter cloacae complex NOT DETECTED NOT DETECTED Final   Escherichia coli NOT DETECTED NOT DETECTED Final   Klebsiella oxytoca NOT DETECTED NOT DETECTED Final   Klebsiella pneumoniae NOT DETECTED NOT DETECTED Final   Proteus species NOT DETECTED NOT DETECTED Final   Serratia marcescens NOT DETECTED NOT DETECTED Final   Haemophilus influenzae NOT DETECTED NOT DETECTED Final   Neisseria meningitidis NOT DETECTED  NOT DETECTED Final   Pseudomonas aeruginosa NOT DETECTED NOT DETECTED Final   Candida albicans NOT DETECTED NOT DETECTED Final   Candida glabrata NOT DETECTED NOT DETECTED Final   Candida krusei NOT DETECTED NOT DETECTED Final   Candida parapsilosis NOT DETECTED NOT DETECTED Final   Candida tropicalis NOT DETECTED NOT DETECTED Final    Comment: Performed at [facility], [place] [address], [place], [place] [address]  Blood Culture (routine x 2)     Status: None (Preliminary result)   Collection Time: 08/31/18 10:04 PM   Specimen: BLOOD RIGHT ARM  Result Value Ref Range Status   Specimen Description BLOOD RIGHT ARM  Final   Special Requests   Final    BOTTLES DRAWN AEROBIC AND ANAEROBIC Blood Culture adequate volume   Culture   Final    NO GROWTH 3 DAYS Performed at [facility], [address] [place], [place] [address]    Report Status PENDING  Incomplete  Culture, respiratory     Status: None   Collection Time: 09/01/18 11:27 AM  Result Value Ref Range Status   Specimen Description   Final    EXPECTORATED SPUTUM Performed at [facility], [address] [place], [place] [address]    Special Requests   Final    NONE Performed at [facility], [address], [place], [place] [address]    Gram Stain   Final    FEW WBC  PRESENT,BOTH PMN AND MONONUCLEAR FEW GRAM POSITIVE COCCI FEW GRAM VARIABLE ROD ABUNDANT YEAST ABUNDANT SQUAMOUS EPITHELIAL CELLS PRESENT    Culture   Final    Consistent with normal respiratory flora. Performed at [facility], [place] [address], [place], [place] [address]    Report Status 09/03/2018 FINAL  Final  MRSA PCR Screening     Status: None   Collection Time: 09/01/18  8:39 PM   Specimen: Nasal Mucosa; Nasopharyngeal  Result Value Ref Range Status   MRSA by PCR NEGATIVE NEGATIVE Final    Comment:        The GeneXpert MRSA Assay (FDA approved for NASAL specimens only), is one component of a comprehensive MRSA colonization surveillance program. It is not intended to diagnose MRSA infection nor to guide or monitor treatment for MRSA infections. Performed at [facility], [address], [place],  [address]      Studies: Dg Chest [facility] 1 View  Result Date: 09/03/2018 CLINICAL DATA:  Pneumonia. EXAM: PORTABLE CHEST 1 VIEW COMPARISON:  Three days ago FINDINGS: Coarse interstitial opacities with mildly low lung volumes. 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 There is history of pneumonia which could be easily obscured. Chronic costophrenic sulcus blunting. No pneumothorax. Right subclavian porta catheter in good position. IMPRESSION: Stable from 3 days ago. Chronic fibrotic and emphysematous lung disease with possible superimposed pneumonia Electronically Signed   By: Monte Fantasia M.D.   On: 09/03/2018 05:59   Scheduled Meds: . aspirin EC  81 mg Oral Daily  . cyclobenzaprine  5 mg Oral TID  . enoxaparin (LOVENOX) injection  40 mg Subcutaneous Q24H  . ferrous sulfate  325 mg Oral Daily  . fluticasone  2 spray Each Nare Daily  . fluticasone furoate-vilanterol  1 puff Inhalation Daily  . gabapentin  300 mg Oral TID  . ipratropium  0.5 mg Nebulization Q6H  . levalbuterol  1.25 mg Nebulization Q6H  . methylPREDNISolone (SOLU-MEDROL) injection  60 mg Intravenous Q6H   . metoCLOPramide  5 mg Oral TID  AC & HS  . mirabegron ER  50 mg Oral Daily  . montelukast  10 mg Oral Daily  . multivitamin with minerals  1 tablet Oral Daily  . oxyCODONE  10 mg Oral Q4H  . pantoprazole  40 mg Oral Daily  . simvastatin  10 mg Oral QHS  . vitamin C  500 mg Oral Daily   Continuous Infusions: . sodium chloride Stopped (09/03/18 0532)  . ceFEPime (MAXIPIME) IV 2 g (09/03/18 0851)    Active Problems:   Tobacco abuse   COPD (chronic obstructive pulmonary disease) (HCC)   Essential hypertension   Abnormal chest x-ray   Mass of lower lobe of left lung   Non-small cell carcinoma of left lung (HCC)   Malignant neoplasm of left lung (HCC)   HCAP (healthcare-associated pneumonia)   Acute and chronic respiratory failure with hypoxia [facility])   Critical Care Time spent: 33 minutes   Irwin Brakeman, MD [facility] 09/03/2018, 12:45 PM    LOS: 2 days  How to contact the [facility] Attending or Consulting provider [place] or covering provider during after hours [place], for this patient?  1. Check the care team in [facility] and look for a) attending/consulting TRH provider listed and b) the [facility] team listed 2. Log into [email or web address] and use [place]'s universal password to access. If you do not have the password, please contact the hospital operator. 3. Locate the [facility] provider you are looking for under [facility] and page to a number that you can be directly reached. 4. If you still have difficulty reaching the provider, please page the [facility] (Director on Call) for the Hospitalists listed on amion for assistance.

## 2018-09-03 NOTE — TOC Initial Note (Addendum)
Transition of Care [facility]) - Initial/Assessment Note    Patient Details  Name: Bradley [name] MRN: [id number] Date of Birth: Nov 17, 1940  [facility] of [facility]) CM/SW Contact:    Trish Mage, LCSW Phone Number: 09/03/2018, 1:17 PM  Clinical Narrative:      Patient seen due to high risk for readmission.  States he is currently on 10L O2, but feels OK with it.  States the O2 company needs to bring him a different set up because his does not go over 4L at home. Does not remember the name of the company, but chart indicates it is through [employer], and HH is through [employer].  Stays daughter, 2 nieces and 2 grand nieces.  "And the dog is also a male."    Up to now has been able to drive to appointments, uses 1 of 2 cans and has a rolling walker as well.  Says he plans to return home at d/c. CSW to continue to follow for progression of care.  Will need appointment with Dr Maudie Mercury and resumption of services orders at d/c.   Will need new O2 level orders at d/c.       Expected Discharge Plan: [place] Barriers to Discharge: No Barriers Identified   Patient Goals and CMS Choice Patient states their goals for this hospitalization and ongoing recovery are:: "I can't get rid of this pneumonia.  It's got me down again.  This time my legs just buckled, and I was doing fine."      Expected Discharge Plan and Services Expected Discharge Plan: [place]   Discharge Planning Services: CM Consult Post Acute Care Choice: [place] arrangements for the past 2 months: Single Family Home                                      Prior Living Arrangements/Services Living arrangements for the past 2 months: Single Family Home Lives with:: Adult Children, Relatives Patient language and need for interpreter reviewed:: Yes Do you feel safe going back to the place where you live?: Yes      Need for Family Participation in Patient Care: Yes (Comment) Care giver  support system in place?: Yes (comment) Current home services: DME Criminal Activity/Legal Involvement Pertinent to Current Situation/Hospitalization: No - Comment as needed  Activities of Daily Living Home Assistive Devices/Equipment: Walker (specify type), Cane (specify quad or straight) ADL Screening (condition at time of admission) Patient's cognitive ability adequate to safely complete daily activities?: Yes Is the patient deaf or have difficulty hearing?: Yes Does the patient have difficulty seeing, even when wearing glasses/contacts?: No Does the patient have difficulty concentrating, remembering, or making decisions?: No Patient able to express need for assistance with ADLs?: No Does the patient have difficulty dressing or bathing?: No Independently performs ADLs?: Yes (appropriate for developmental age) Communication: Independent Dressing (OT): Needs assistance Is this a change from baseline?: Pre-admission baseline Grooming: Needs assistance Is this a change from baseline?: Pre-admission baseline Feeding: Independent Bathing: Needs assistance Is this a change from baseline?: Pre-admission baseline Toileting: Needs assistance Is this a change from baseline?: Pre-admission baseline In/Out Bed: Needs assistance Is this a change from baseline?: Pre-admission baseline Walks in Home: Independent Does the patient have difficulty walking or climbing stairs?: Yes Weakness of Legs: Both Weakness of Arms/Hands: Both  Permission Sought/Granted Permission sought to share information with : Family  Supports Permission granted to share information with : Yes, Verbal Permission Granted  Share Information with NAME: Freda Munro     Permission granted to share info w Relationship: daughter  Permission granted to share info w Contact Information: [phone]  Emotional Assessment Appearance:: Appears stated age Attitude/Demeanor/Rapport: Engaged Affect (typically observed):  Appropriate Orientation: : Oriented to Situation, Oriented to  Time, Oriented to Self, Oriented to Place Alcohol / Substance Use: Not Applicable Psych Involvement: No (comment)  Admission diagnosis:  Hypoxia [R09.02] HCAP (healthcare-associated pneumonia) [J18.9] Dyspnea, unspecified type [R06.00] Acute and chronic respiratory failure with hypoxia ([place]) [J96.21] Patient Active Problem List   Diagnosis Date Noted  . Acute and chronic respiratory failure with hypoxia ([place]) 09/01/2018  . Dyspnea   . Hypoxia   . Acute respiratory failure with hypoxia ([place]) 08/03/2018  . Hyperglycemia 08/03/2018  . Acute on chronic respiratory failure with hypoxia ([place]) 08/03/2018  . Recurrent left pleural effusion 06/25/2018  . Postobstructive pneumonia 06/25/2018  . On home O2 06/25/2018  . Acute on chronic respiratory failure ([place]) 06/25/2018  . Metastatic cancer ([place])   . Palliative care encounter   . Encounter for hospice care discussion   . HCAP (healthcare-associated pneumonia) 06/13/2018  . Acute maxillary sinusitis 12/30/2017  . Palliative care by specialist   . Goals of care, counseling/discussion   . Malignant neoplasm of left lung ([place])   . Left leg weakness   . Nosocomial pneumonia 12/29/2017  . Non-small cell carcinoma of left lung ([place]) 12/29/2017  . Mass of lower lobe of left lung 12/10/2017  . Chest pain in adult   . Syncope and collapse   . Rt Hip fracture ([place]) 12/19/2016  . Chest pain 09/12/2016  . Leukocytosis 09/12/2016  . Abnormal chest x-ray 09/12/2016  . Hyperlipidemia 09/12/2016  . Hypokalemia 09/12/2016  . Precordial pain   . Tobacco abuse 09/11/2016  . COPD (chronic obstructive pulmonary disease) ([place]) 09/11/2016  . CAD (coronary artery disease), native coronary artery 09/11/2016  . Chronic pain syndrome 09/11/2016  . Essential hypertension 09/11/2016  . Right-sided epistaxis 03/15/2016   PCP:  Jani Gravel, MD Pharmacy:   [id number] [place], [address] [place] [address] Phone: [phone] Fax: [phone]     Social Determinants of Health (SDOH) Interventions    Readmission Risk Interventions Readmission Risk Prevention Plan 08/05/2018 08/04/2018  Transportation Screening - Complete  Medication Review Press photographer) - Complete  PCP or Specialist appointment within 3-5 days of discharge Complete -  [place] or [place] - Complete  SW Recovery Care/Counseling Consult - Complete   [place] - Complete

## 2018-09-03 NOTE — Care Management Important Message (Signed)
Important Message  Patient Details  Name: Bradley [name] MRN: [id number] Date of Birth: 1940/06/23   Medicare Important Message Given:  Yes     Tommy Medal 09/03/2018, 1:50 PM

## 2018-09-03 NOTE — Progress Notes (Signed)
Night shift TRH coverage note.  The patient was seen due to hypoxia in the mid to high 80s, mild tachypnea in the low 20s and tachycardia in the 110s. His most recent vital signs are temperature 98.1 F, pulse 117 bpm, respiratory rate 28/min, BP 136/79 mmHg and O2 sat 89% on room air.  He has decreased air movement, bilateral rhonchi and wheezing, CV S1-S2, tachycardic, no murmurs, abdomen is soft, extremities no CCE, neuro awake, alert and oriented.  He had a recent DuoNeb treatment about 30 to 45 minutes prior to my examination.  Plan: Xopenex neb x1. Solu-Medrol 125 mg IVP x1. Magnesium sulfate 2 g IVPB x1. Potassium chloride 20 mEq p.o. x1.  Post Xopenex treatment he has increased air movement with mild rhonchi and more audible wheezing.\  Tennis Must, MD

## 2018-09-03 NOTE — Progress Notes (Addendum)
At 0805-Transferred to Step-Down room 10. Stable. Received by Purcell Nails. Report given. Clothes, watch, R hearing aide in and batteries with patient.

## 2018-09-03 NOTE — Progress Notes (Signed)
Patient called RN to room because he had to use the urinal. Patient's HR in the 110s and O2 in the 60s on 12L HFNC. Patient placed on NRB mask and O2 sats came back up to 87-88% on 15L HFNC. Respiratory and MD notified.

## 2018-09-03 NOTE — Progress Notes (Signed)
Pt on a NRB with sats in the 90%. Alert and oriented. HR 106 and reg , B/P 146/80, RR 28 and labored. Exp/Insp wheezes. Pt does not c/o SOB. The pt has been on the NRB for 2 hours. The report I recieved is that Dr. Olevia Bowens has seen the pt. Dr. Wynetta Emery notified via Epic message with orders obtained to transfer to step-down. Pending step-down bed assignement. AC notified. Continue to monitor.

## 2018-09-04 ENCOUNTER — Inpatient Hospital Stay (HOSPITAL_COMMUNITY): Payer: Medicare Other

## 2018-09-04 LAB — BASIC METABOLIC PANEL
Anion gap: 10 (ref 5–15)
BUN: 26 mg/dL — ABNORMAL HIGH (ref 8–23)
CO2: 24 mmol/L (ref 22–32)
Calcium: 8.7 mg/dL — ABNORMAL LOW (ref 8.9–10.3)
Chloride: 102 mmol/L (ref 98–111)
Creatinine, Ser: 0.79 mg/dL (ref 0.61–1.24)
GFR calc Af Amer: >60 mL/min (ref >60)
GFR calc non Af Amer: >60 mL/min (ref >60)
Glucose, Bld: 133 mg/dL — ABNORMAL HIGH (ref 70–99)
Potassium: 3.9 mmol/L (ref 3.5–5.1)
Sodium: 136 mmol/L (ref 135–145)

## 2018-09-04 LAB — CBC
HCT: 39 % (ref 39.0–52.0)
Hemoglobin: 12.4 g/dL — ABNORMAL LOW (ref 13.0–17.0)
MCH: 28.2 pg (ref 26.0–34.0)
MCHC: 31.8 g/dL (ref 30.0–36.0)
MCV: 88.8 fL (ref 80.0–100.0)
Platelets: 525 10*3/uL — ABNORMAL HIGH (ref 150–400)
RBC: 4.39 MIL/uL (ref 4.22–5.81)
RDW: 16.4 % — ABNORMAL HIGH (ref 11.5–15.5)
WBC: 10.9 10*3/uL — ABNORMAL HIGH (ref 4.0–10.5)
nRBC: 0 % (ref 0.0–0.2)

## 2018-09-04 MED ORDER — MORPHINE SULFATE (PF) 4 MG/ML IV SOLN
4.0000 mg | INTRAVENOUS | Status: DC | PRN
  Administered 2018-09-08: 4 mg via INTRAVENOUS
  Filled 2018-09-04: qty 1

## 2018-09-04 MED ORDER — LEVALBUTEROL HCL 1.25 MG/0.5ML IN NEBU
INHALATION_SOLUTION | RESPIRATORY_TRACT | Status: AC
  Filled 2018-09-04: qty 0.5

## 2018-09-04 MED ORDER — IPRATROPIUM BROMIDE 0.02 % IN SOLN
RESPIRATORY_TRACT | Status: AC
  Filled 2018-09-04: qty 2.5

## 2018-09-04 NOTE — Progress Notes (Signed)
Subjective: He is quite agitated and tachypneic this morning.  He is breathing up to 50 times a minute.  He has mitts in place to keep him from removing equipment but that seems to be causing some of his agitation.  Objective: Vital signs in last 24 hours: Temp:  [97.6 F (36.4 C)-98.5 F (36.9 C)] 97.6 F (36.4 C) (08/08 0750) Pulse Rate:  [89-117] 103 (08/08 0900) Resp:  [16-33] 25 (08/08 0900) BP: (150-176)/(81-111) 176/111 (08/08 0900) SpO2:  [78 %-97 %] 96 % (08/08 0932) FiO2 (%):  [40 %] 40 % (08/08 0806) Weight change:  Last BM Date: 08/30/18  Intake/Output from previous day: 08/07 0701 - 08/08 0700 In: 114.3 [I.V.:14.3; IV Piggyback:100] Out: 295 [Urine:295]  PHYSICAL EXAM General appearance: alert and moderate distress Resp: rales bilaterally and rhonchi bilaterally Cardio: regular rate and rhythm, S1, S2 normal, no murmur, click, rub or gallop GI: soft, non-tender; bowel sounds normal; no masses,  no organomegaly Extremities: extremities normal, atraumatic, no cyanosis or edema  Lab Results:  Results for orders placed or performed during the hospital encounter of 08/31/18 (from the past 48 hour(s))  CBC     Status: Abnormal   Collection Time: 09/03/18  5:12 AM  Result Value Ref Range   WBC 15.0 (H) 4.0 - 10.5 K/uL   RBC 4.59 4.22 - 5.81 MIL/uL   Hemoglobin 13.0 13.0 - 17.0 g/dL   HCT 41.3 39.0 - 52.0 %   MCV 90.0 80.0 - 100.0 fL   MCH 28.3 26.0 - 34.0 pg   MCHC 31.5 30.0 - 36.0 g/dL   RDW 16.4 (H) 11.5 - 15.5 %   Platelets 529 (H) 150 - 400 K/uL   nRBC 0.0 0.0 - 0.2 %    Comment: Performed at [facility], [address] [place], [place] [address]  Comprehensive metabolic panel     Status: Abnormal   Collection Time: 09/03/18  5:12 AM  Result Value Ref Range   Sodium 131 (L) 135 - 145 mmol/L   Potassium 3.7 3.5 - 5.1 mmol/L   Chloride 99 98 - 111 mmol/L   CO2 23 22 - 32 mmol/L   Glucose, Bld 101 (H) 70 - 99 mg/dL   BUN 15 8 - 23 mg/dL   Creatinine, Ser  0.85 0.61 - 1.24 mg/dL   Calcium 8.5 (L) 8.9 - 10.3 mg/dL   Total Protein 7.8 6.5 - 8.1 g/dL   Albumin 2.4 (L) 3.5 - 5.0 g/dL   AST 14 (L) 15 - 41 U/L   ALT 10 0 - 44 U/L   Alkaline Phosphatase 23 (L) 38 - 126 U/L   Total Bilirubin 1.0 0.3 - 1.2 mg/dL   GFR calc non Af Amer >60 >60 mL/min   GFR calc Af Amer >60 >60 mL/min   Anion gap 9 5 - 15    Comment: Performed at [facility], [address] [place], [place] [address]  CBC     Status: Abnormal   Collection Time: 09/04/18  4:42 AM  Result Value Ref Range   WBC 10.9 (H) 4.0 - 10.5 K/uL   RBC 4.39 4.22 - 5.81 MIL/uL   Hemoglobin 12.4 (L) 13.0 - 17.0 g/dL   HCT 39.0 39.0 - 52.0 %   MCV 88.8 80.0 - 100.0 fL   MCH 28.2 26.0 - 34.0 pg   MCHC 31.8 30.0 - 36.0 g/dL   RDW 16.4 (H) 11.5 - 15.5 %   Platelets 525 (H) 150 - 400 K/uL  nRBC 0.0 0.0 - 0.2 %    Comment: Performed at [facility], [address], [place], [place] [address]  Basic metabolic panel     Status: Abnormal   Collection Time: 09/04/18  4:42 AM  Result Value Ref Range   Sodium 136 135 - 145 mmol/L   Potassium 3.9 3.5 - 5.1 mmol/L   Chloride 102 98 - 111 mmol/L   CO2 24 22 - 32 mmol/L   Glucose, Bld 133 (H) 70 - 99 mg/dL   BUN 26 (H) 8 - 23 mg/dL   Creatinine, Ser 0.79 0.61 - 1.24 mg/dL   Calcium 8.7 (L) 8.9 - 10.3 mg/dL   GFR calc non Af Amer >60 >60 mL/min   GFR calc Af Amer >60 >60 mL/min   Anion gap 10 5 - 15    Comment: Performed at [facility], [address], [place], [place] [address]    ABGS No results for input(s): PHART, PO2ART, TCO2, HCO3 in the last 72 hours.  Invalid input(s): PCO2 CULTURES Recent Results (from the past 240 hour(s))  Urine culture     Status: None   Collection Time: 08/31/18  9:45 PM   Specimen: In/Out Cath Urine  Result Value Ref Range Status   Specimen Description   Final    IN/OUT CATH URINE Performed at [facility], [address], [place], [place] [address]    Special Requests   Final    NONE Performed at [facility], [address], [place], [place] [address]    Culture   Final    NO GROWTH Performed at [facility], [place] [address] [place], [place] [address]    Report Status 09/02/2018 FINAL  Final  SARS Coronavirus 2 [facility] order, Performed in [facility] hospital lab) Nasopharyngeal Nasopharyngeal Swab     Status: None   Collection Time: 08/31/18  9:46 PM   Specimen: Nasopharyngeal Swab  Result Value Ref Range Status   SARS Coronavirus 2 NEGATIVE NEGATIVE Final    Comment: (NOTE) If result is NEGATIVE SARS-CoV-2 target nucleic acids are NOT DETECTED. The SARS-CoV-2 RNA is generally detectable in upper and lower  respiratory specimens during the acute phase of infection. The lowest  concentration of SARS-CoV-2 viral copies this assay can detect is 250  copies / mL. A negative result does not preclude SARS-CoV-2 infection  and should not be used as the sole basis for treatment or other  patient management decisions.  A negative result may occur with  improper specimen collection / handling, submission of specimen other  than nasopharyngeal swab, presence of viral mutation(s) within the  areas targeted by this assay, and inadequate number of viral copies  (<250 copies / mL). A negative result must be combined with clinical  observations, patient history, and epidemiological information. If result is POSITIVE SARS-CoV-2 target nucleic acids are DETECTED. The SARS-CoV-2 RNA is generally detectable in upper and lower  respiratory specimens dur ing the acute phase of infection.  Positive  results are indicative of active infection with SARS-CoV-2.  Clinical  correlation with patient history and other diagnostic information is  necessary to determine patient infection status.  Positive results do  not rule out bacterial infection or co-infection with other viruses. If result is PRESUMPTIVE POSTIVE SARS-CoV-2 nucleic acids MAY BE PRESENT.   A presumptive positive result was obtained  on the submitted specimen  and confirmed on repeat testing.  While 2019 novel coronavirus  (SARS-CoV-2) nucleic acids may be present in the  submitted sample  additional confirmatory testing may be necessary for epidemiological  and / or clinical management purposes  to differentiate between  SARS-CoV-2 and other Sarbecovirus currently known to infect humans.  If clinically indicated additional testing with an alternate test  methodology [phone]) is advised. The SARS-CoV-2 RNA is generally  detectable in upper and lower respiratory sp ecimens during the acute  phase of infection. The expected result is Negative. Fact Sheet for Patients:  StrictlyIdeas.no Fact Sheet for Healthcare Providers: BankingDealers.co.za This test is not yet approved or cleared by the [place] FDA and has been authorized for detection and/or diagnosis of SARS-CoV-2 by FDA under an Emergency Use Authorization (EUA).  This EUA will remain in effect (meaning this test can be used) for the duration of the COVID-19 declaration under Section 564(b)(1) of the Act, 21 U.S.C. section 360bbb-3(b)(1), unless the authorization is terminated or revoked sooner. Performed at [facility], [address], [place], [place] [address]   Blood Culture (routine x 2)     Status: Abnormal   Collection Time: 08/31/18 10:03 PM   Specimen: BLOOD RIGHT HAND  Result Value Ref Range Status   Specimen Description   Final    BLOOD RIGHT HAND Performed at [facility], [address] [place], [place] [address]    Special Requests   Final    BOTTLES DRAWN AEROBIC AND ANAEROBIC Blood Culture adequate volume Performed at [facility], [address] [place], [place] [address]    Culture  Setup Time   Final    GRAM POSITIVE COCCI IN BOTH AEROBIC AND ANAEROBIC BOTTLES Gram Stain Report Called to,Read Back By and Verified With: MINTER,R ON 09/01/18 AT 41 BY LOY,C PERFORMED AT [facility] CRITICAL  RESULT CALLED TO, READ BACK BY AND VERIFIED WITH: C HOPKINS RN 09/01/18 2220 JDW    Culture (A)  Final    STAPHYLOCOCCUS SPECIES (COAGULASE NEGATIVE) THE SIGNIFICANCE OF ISOLATING THIS ORGANISM FROM A SINGLE SET OF BLOOD CULTURES WHEN MULTIPLE SETS ARE DRAWN IS UNCERTAIN. PLEASE NOTIFY THE MICROBIOLOGY DEPARTMENT WITHIN ONE WEEK IF SPECIATION AND SENSITIVITIES ARE REQUIRED. Performed at [facility], [place] [address], [place], [place] [address]    Report Status 09/03/2018 FINAL  Final  Blood Culture ID Panel (Reflexed)     Status: Abnormal   Collection Time: 08/31/18 10:03 PM  Result Value Ref Range Status   Enterococcus species NOT DETECTED NOT DETECTED Final   Listeria monocytogenes NOT DETECTED NOT DETECTED Final   Staphylococcus species DETECTED (A) NOT DETECTED Final    Comment: Methicillin (oxacillin) resistant coagulase negative staphylococcus. Possible blood culture contaminant (unless isolated from more than one blood culture draw or clinical case suggests pathogenicity). No antibiotic treatment is indicated for blood  culture contaminants. CRITICAL RESULT CALLED TO, READ BACK BY AND VERIFIED WITH: C HOPKINS RN 09/01/18 2220 JDW    Staphylococcus aureus (BCID) NOT DETECTED NOT DETECTED Final   Methicillin resistance DETECTED (A) NOT DETECTED Final    Comment: CRITICAL RESULT CALLED TO, READ BACK BY AND VERIFIED WITH: C HOPKINS RN 09/01/18 2220 JDW    Streptococcus species NOT DETECTED NOT DETECTED Final   Streptococcus agalactiae NOT DETECTED NOT DETECTED Final   Streptococcus pneumoniae NOT DETECTED NOT DETECTED Final   Streptococcus pyogenes NOT DETECTED NOT DETECTED Final   Acinetobacter baumannii NOT DETECTED NOT DETECTED Final   Enterobacteriaceae species NOT DETECTED NOT DETECTED Final   Enterobacter cloacae complex NOT DETECTED NOT DETECTED Final   Escherichia coli NOT DETECTED NOT DETECTED  Final   Klebsiella oxytoca NOT DETECTED NOT DETECTED Final   Klebsiella  pneumoniae NOT DETECTED NOT DETECTED Final   Proteus species NOT DETECTED NOT DETECTED Final   Serratia marcescens NOT DETECTED NOT DETECTED Final   Haemophilus influenzae NOT DETECTED NOT DETECTED Final   Neisseria meningitidis NOT DETECTED NOT DETECTED Final   Pseudomonas aeruginosa NOT DETECTED NOT DETECTED Final   Candida albicans NOT DETECTED NOT DETECTED Final   Candida glabrata NOT DETECTED NOT DETECTED Final   Candida krusei NOT DETECTED NOT DETECTED Final   Candida parapsilosis NOT DETECTED NOT DETECTED Final   Candida tropicalis NOT DETECTED NOT DETECTED Final    Comment: Performed at [facility], [place] [address], [place], [place] [address]  Blood Culture (routine x 2)     Status: None (Preliminary result)   Collection Time: 08/31/18 10:04 PM   Specimen: BLOOD RIGHT ARM  Result Value Ref Range Status   Specimen Description BLOOD RIGHT ARM  Final   Special Requests   Final    BOTTLES DRAWN AEROBIC AND ANAEROBIC Blood Culture adequate volume   Culture   Final    NO GROWTH 4 DAYS Performed at [facility], [address] [place], [place] [address]    Report Status PENDING  Incomplete  Culture, respiratory     Status: None   Collection Time: 09/01/18 11:27 AM  Result Value Ref Range Status   Specimen Description   Final    EXPECTORATED SPUTUM Performed at [facility], [address], [place], [place] [address]    Special Requests   Final    NONE Performed at [facility], [address] [place], [place] [address]    Gram Stain   Final    FEW WBC PRESENT,BOTH PMN AND MONONUCLEAR FEW GRAM POSITIVE COCCI FEW GRAM VARIABLE ROD ABUNDANT YEAST ABUNDANT SQUAMOUS EPITHELIAL CELLS PRESENT    Culture   Final    Consistent with normal respiratory flora. Performed at [facility], [place] [address] [place], [place] [address]    Report Status 09/03/2018 FINAL  Final  MRSA PCR Screening     Status: None   Collection Time: 09/01/18  8:39 PM   Specimen: Nasal Mucosa;  Nasopharyngeal  Result Value Ref Range Status   MRSA by PCR NEGATIVE NEGATIVE Final    Comment:        The GeneXpert MRSA Assay (FDA approved for NASAL specimens only), is one component of a comprehensive MRSA colonization surveillance program. It is not intended to diagnose MRSA infection nor to guide or monitor treatment for MRSA infections. Performed at [facility], [address] [place], [place] [address]    Studies/Results: Dg Chest Port 1 View  Result Date: 09/03/2018 CLINICAL DATA:  Pneumonia. EXAM: PORTABLE CHEST 1 VIEW COMPARISON:  Three days ago FINDINGS: Coarse interstitial opacities with mildly low lung volumes. 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 There is history of pneumonia which could be easily obscured. Chronic costophrenic sulcus blunting. No pneumothorax. Right subclavian porta catheter in good position. IMPRESSION: Stable from 3 days ago. Chronic fibrotic and emphysematous lung disease with possible superimposed pneumonia Electronically Signed   By: Monte Fantasia M.D.   On: 09/03/2018 05:59    Medications:  Prior to Admission:  Medications Prior to Admission  Medication Sig Dispense Refill Last Dose  . ADVAIR HFA 115-21 MCG/ACT inhaler Inhale 2 puffs into the lungs 2 (two) times daily. 1 Inhaler 4 08/31/2018 at Unknown time  . amLODipine (NORVASC) 5  MG tablet TAKE 1 TABLET BY MOUTH EVERY DAY (Patient taking differently: Take 5 mg by mouth daily. ) 90 tablet 1 08/31/2018 at Unknown time  . Ascorbic Acid (VITAMIN C) 500 MG CAPS Take 500 mg by mouth daily.   08/31/2018 at Unknown time  . aspirin EC 81 MG tablet Take 81 mg by mouth daily.    08/31/2018 at Unknown time  . cyclobenzaprine (FLEXERIL) 5 MG tablet Take 5 mg by mouth 3 (three) times daily.    08/31/2018 at Unknown time  . Ferrous Sulfate (IRON) 325 (65 Fe) MG TABS Take 1 tablet by mouth at bedtime.    08/31/2018 at Unknown time  . fluticasone (FLONASE) 50 MCG/ACT nasal spray Place 2 sprays into both nostrils  daily.  6 08/31/2018 at Unknown time  . furosemide (LASIX) 20 MG tablet Take 1 tablet (20 mg total) by mouth daily. 10 tablet 0 08/31/2018 at Unknown time  . gabapentin (NEURONTIN) 300 MG capsule Take 1 capsule by mouth 3 (three) times daily.  2 08/31/2018 at Unknown time  . ipratropium-albuterol (DUONEB) 0.5-2.5 (3) MG/3ML SOLN Take 3 mLs by nebulization every 6 (six) hours as needed (for shortness of breath).   08/31/2018 at Unknown time  . lidocaine-prilocaine (EMLA) cream Apply 1 application topically as needed. Apply a small amount to port site and cover with plastic wrap 1 hour prior to infusion appointments 30 g 3 Past Week at Unknown time  . lisinopril (ZESTRIL) 20 MG tablet Take 1 tablet (20 mg total) by mouth daily.   08/31/2018 at Unknown time  . metoCLOPramide (REGLAN) 5 MG tablet Take 1 tablet by mouth 4 (four) times daily -  before meals and at bedtime. Daily as needed  5 Past Month at Unknown time  . metoprolol succinate (TOPROL-XL) 25 MG 24 hr tablet Take 25 mg by mouth daily.   08/31/2018 at 0800  . Multiple Vitamins-Minerals (MULTIVITAMIN WITH MINERALS) tablet Take 1 tablet by mouth daily.   08/31/2018 at Unknown time  . MYRBETRIQ 50 MG TB24 tablet Take 1 tablet by mouth daily.   08/31/2018 at Unknown time  . nitroGLYCERIN (NITROSTAT) 0.4 MG SL tablet Place 0.4 mg under the tongue every 5 (five) minutes as needed for chest pain.   unknown  . Nivolumab (OPDIVO IV) Inject into the vein every 14 (fourteen) days.   Past Month at Unknown time  . Oxycodone HCl 10 MG TABS Take 1 tablet by mouth every 4 (four) hours.    08/31/2018 at Unknown time  . potassium gluconate (HM POTASSIUM) 595 (99 K) MG TABS tablet Take 595 mg by mouth daily.   08/31/2018 at Unknown time  . PRILOSEC 40 MG capsule Take 1 capsule by mouth daily.   08/31/2018 at Unknown time  . simvastatin (ZOCOR) 10 MG tablet Take 10 mg by mouth at bedtime.   3 08/31/2018 at Unknown time  . VENTOLIN HFA 108 (90 Base) MCG/ACT inhaler Inhale 2 puffs into  the lungs every 4 (four) hours as needed for wheezing or shortness of breath. 18 g 5 Past Month at Unknown time  . Ipilimumab (YERVOY IV) Inject into the vein every 6 (six) weeks.   Unknown at Unknown time  . polyethylene glycol (MIRALAX / GLYCOLAX) 17 g packet Take 17 g by mouth daily as needed for mild constipation. 14 each 0 Unknown at Unknown time   Scheduled: . aspirin EC  81 mg Oral Daily  . Chlorhexidine Gluconate Cloth  6 each Topical Q0600  .  cyclobenzaprine  5 mg Oral TID  . enoxaparin (LOVENOX) injection  40 mg Subcutaneous Q24H  . ferrous sulfate  325 mg Oral Daily  . fluticasone  2 spray Each Nare Daily  . fluticasone furoate-vilanterol  1 puff Inhalation Daily  . gabapentin  300 mg Oral TID  . ipratropium  0.5 mg Nebulization Q6H  . levalbuterol  1.25 mg Nebulization Q6H  . methylPREDNISolone (SOLU-MEDROL) injection  60 mg Intravenous Q6H  . metoCLOPramide  5 mg Oral TID AC & HS  . mirabegron ER  50 mg Oral Daily  . montelukast  10 mg Oral Daily  . multivitamin with minerals  1 tablet Oral Daily  . oxyCODONE  10 mg Oral Q4H  . pantoprazole  40 mg Oral Daily  . simvastatin  10 mg Oral QHS  . vitamin C  500 mg Oral Daily   Continuous: . sodium chloride Stopped (09/03/18 0532)  . ceFEPime (MAXIPIME) IV 2 g (09/04/18 0738)   YIA:[id number] chloride, albuterol, ipratropium-albuterol, LORazepam, polyethylene glycol  Assesment: He was admitted with respiratory failure which is acute on chronic.  He has pulmonary fibrosis at baseline.  He has COPD at baseline.  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  He is agitated and very tachypneic again. 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  He might be helped by some morphine which may help his dyspnea and his agitation so I am going to go ahead and order that.    LOS: 3 days   Alonza Bogus 09/04/2018, 9:35 AM

## 2018-09-04 NOTE — Progress Notes (Signed)
PROGRESS NOTE    Bradley [name]  VWP:[id number]  DOB: 1940-09-16  DOA: 08/31/2018 PCP: Jani Gravel, MD   Brief Admission Hx: 78 y.o. male, with history of non-small cell lung cancer, COPD, CAD, HLD, anemia, and hypertension who presents to the ED today via EMS for shortness of breath and recurrent HCAP.   MDM/Assessment & Plan:   1. Acute on chronic hypoxic respiratory failure-he had initially started showing improvement but had taken a turn for the worse and now is on bipap and was transferred to the stepdown ICU and started on IV steroids.  Appreciate pulmonary consultation and assistance from Dr. Luan Pulling.  Continue bipap treatments.  He does not want to be intubated.  I confirmed with his daughter.  Morphine ordered for dyspnea.  2. Sepsis secondary to HCAP - continue current treatments.   3. HCAP - continue cefepime.  DC vanc as MRSA screen negative. Coag neg staph seen in 1/2 blood culture likely contaminant.   Pleurx cath that was removed 08/25/18 for a malignant pleural effusion that had resolved.  4. Hyponatremia - stable to improved.  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  5. Non small cell lung cancer - he will need to follow up with oncologist after discharge. He was seen on 08/24/18. They are planning CT CAP in next week prior to appt next week.  6. S/p recent Pleuryx cath removal - will ask RN to remove sutures.  7. Essential hypertension - soft BPs, holding home antihypertensives   DVT prophylaxis: lovenox Code Status: DNR Family Communication: patient bedside update/daughter updated telephone Disposition Plan: daughter wants to pursue SNF if patient survives this hospitalization  Consultants:  Pulmonology  Procedures:    Antimicrobials:  Cefepime  Subjective: Pt is on bipap and has had intermittent agitation.  Objective: Vitals:   09/04/18 0800 09/04/18 0806 09/04/18 0900 09/04/18 0932  BP: (!) 153/89  (!) 176/111   Pulse: 89  (!) 103   Resp: 19  (!) 25    Temp:      TempSrc:      SpO2: 94% 93% 93% 96%  Weight:      Height:        Intake/Output Summary (Last 24 hours) at 09/04/2018 1000 Last data filed at 09/03/2018 1900 Gross per 24 hour  Intake 114.28 ml  Output 175 ml  Net -60.72 ml   Filed Weights   08/31/18 2134 08/31/18 2210 09/01/18 2005  Weight: 68 kg 68 kg 67.7 kg    REVIEW OF SYSTEMS  Unable to obtain due to condition  Exam:  General exam: elderly male on bipap, appears chronically ill.   Respiratory system: diffuse rales poor air movement.  Moderate increased work of breathing with tachypnea. Cardiovascular system: S1 & S2 heard. No JVD, murmurs, gallops, clicks or pedal edema. Gastrointestinal system: Abdomen is nondistended, soft and nontender. Normal bowel sounds heard. Central nervous system: lethargic but arousable. No focal neurological deficits. Extremities: no CCE.  Data Reviewed: Basic Metabolic Panel: Recent Labs  Lab 08/31/18 2203 09/02/18 0917 09/03/18 0512 09/04/18 0442  NA 133* 133* 131* 136  K 3.7 3.7 3.7 3.9  CL 102 102 99 102  CO2 21* 23 23 24   GLUCOSE 126* 100* 101* 133*  BUN 19 14 15  26*  CREATININE 1.24 0.83 0.85 0.79  CALCIUM 8.1* 8.3* 8.5* 8.7*   Liver Function Tests: Recent Labs  Lab 08/31/18 2203 09/02/18 0917 09/03/18 0512  AST 14* 15 14*  ALT 10 9 10   ALKPHOS 31* 23* 23*  BILITOT 0.7 1.3* 1.0  PROT 8.3* 8.1 7.8  ALBUMIN 2.6* 2.5* 2.4*   No results for input(s): LIPASE, AMYLASE in the last 168 hours. No results for input(s): AMMONIA in the last 168 hours. CBC: Recent Labs  Lab 08/31/18 2203 09/02/18 0917 09/03/18 0512 09/04/18 0442  WBC 16.7* 12.0* 15.0* 10.9*  NEUTROABS 12.9*  --   --   --   HGB 13.6 12.9* 13.0 12.4*  HCT 44.3 41.4 41.3 39.0  MCV 91.3 91.4 90.0 88.8  PLT 590* 554* 529* 525*   Cardiac Enzymes: No results for input(s): CKTOTAL, CKMB, CKMBINDEX, TROPONINI in the last 168 hours. CBG (last 3)  No results for input(s): GLUCAP in the last 72  hours. Recent Results (from the past 240 hour(s))  Urine culture     Status: None   Collection Time: 08/31/18  9:45 PM   Specimen: In/Out Cath Urine  Result Value Ref Range Status   Specimen Description   Final    IN/OUT CATH URINE Performed at [facility], [address], [place], [place] [address]    Special Requests   Final    NONE Performed at [facility], [address], [place], [place] [address]    Culture   Final    NO GROWTH Performed at [facility], [place] [address], [place], [place] [address]    Report Status 09/02/2018 FINAL  Final  SARS Coronavirus 2 [facility] order, Performed in [facility] hospital lab) Nasopharyngeal Nasopharyngeal Swab     Status: None   Collection Time: 08/31/18  9:46 PM   Specimen: Nasopharyngeal Swab  Result Value Ref Range Status   SARS Coronavirus 2 NEGATIVE NEGATIVE Final    Comment: (NOTE) If result is NEGATIVE SARS-CoV-2 target nucleic acids are NOT DETECTED. The SARS-CoV-2 RNA is generally detectable in upper and lower  respiratory specimens during the acute phase of infection. The lowest  concentration of SARS-CoV-2 viral copies this assay can detect is 250  copies / mL. A negative result does not preclude SARS-CoV-2 infection  and should not be used as the sole basis for treatment or other  patient management decisions.  A negative result may occur with  improper specimen collection / handling, submission of specimen other  than nasopharyngeal swab, presence of viral mutation(s) within the  areas targeted by this assay, and inadequate number of viral copies  (<250 copies / mL). A negative result must be combined with clinical  observations, patient history, and epidemiological information. If result is POSITIVE SARS-CoV-2 target nucleic acids are DETECTED. The SARS-CoV-2 RNA is generally detectable in upper and lower  respiratory specimens dur ing the acute phase of infection.  Positive  results are indicative of active  infection with SARS-CoV-2.  Clinical  correlation with patient history and other diagnostic information is  necessary to determine patient infection status.  Positive results do  not rule out bacterial infection or co-infection with other viruses. If result is PRESUMPTIVE POSTIVE SARS-CoV-2 nucleic acids MAY BE PRESENT.   A presumptive positive result was obtained on the submitted specimen  and confirmed on repeat testing.  While 2019 novel coronavirus  (SARS-CoV-2) nucleic acids may be present in the submitted sample  additional confirmatory testing may be necessary for epidemiological  and / or clinical management purposes  to differentiate between  SARS-CoV-2 and other Sarbecovirus currently known to infect humans.  If clinically indicated additional testing with an alternate test  methodology [phone]) is advised. The SARS-CoV-2 RNA is generally  detectable  in upper and lower respiratory sp ecimens during the acute  phase of infection. The expected result is Negative. Fact Sheet for Patients:  StrictlyIdeas.no Fact Sheet for Healthcare Providers: BankingDealers.co.za This test is not yet approved or cleared by the [place] FDA and has been authorized for detection and/or diagnosis of SARS-CoV-2 by FDA under an Emergency Use Authorization (EUA).  This EUA will remain in effect (meaning this test can be used) for the duration of the COVID-19 declaration under Section 564(b)(1) of the Act, 21 U.S.C. section 360bbb-3(b)(1), unless the authorization is terminated or revoked sooner. Performed at [facility], [address], [place], [place] [address]   Blood Culture (routine x 2)     Status: Abnormal   Collection Time: 08/31/18 10:03 PM   Specimen: BLOOD RIGHT HAND  Result Value Ref Range Status   Specimen Description   Final    BLOOD RIGHT HAND Performed at [facility], [address] [place], [place] [address]    Special  Requests   Final    BOTTLES DRAWN AEROBIC AND ANAEROBIC Blood Culture adequate volume Performed at [facility], [address] [place], [place] [address]    Culture  Setup Time   Final    GRAM POSITIVE COCCI IN BOTH AEROBIC AND ANAEROBIC BOTTLES Gram Stain Report Called to,Read Back By and Verified With: MINTER,R ON 09/01/18 AT 69 BY LOY,C PERFORMED AT [facility] CRITICAL RESULT CALLED TO, READ BACK BY AND VERIFIED WITH: C HOPKINS RN 09/01/18 2220 JDW    Culture (A)  Final    STAPHYLOCOCCUS SPECIES (COAGULASE NEGATIVE) THE SIGNIFICANCE OF ISOLATING THIS ORGANISM FROM A SINGLE SET OF BLOOD CULTURES WHEN MULTIPLE SETS ARE DRAWN IS UNCERTAIN. PLEASE NOTIFY THE MICROBIOLOGY DEPARTMENT WITHIN ONE WEEK IF SPECIATION AND SENSITIVITIES ARE REQUIRED. Performed at [facility], [place] [address] [place], [place] [address]    Report Status 09/03/2018 FINAL  Final  Blood Culture ID Panel (Reflexed)     Status: Abnormal   Collection Time: 08/31/18 10:03 PM  Result Value Ref Range Status   Enterococcus species NOT DETECTED NOT DETECTED Final   Listeria monocytogenes NOT DETECTED NOT DETECTED Final   Staphylococcus species DETECTED (A) NOT DETECTED Final    Comment: Methicillin (oxacillin) resistant coagulase negative staphylococcus. Possible blood culture contaminant (unless isolated from more than one blood culture draw or clinical case suggests pathogenicity). No antibiotic treatment is indicated for blood  culture contaminants. CRITICAL RESULT CALLED TO, READ BACK BY AND VERIFIED WITH: C HOPKINS RN 09/01/18 2220 JDW    Staphylococcus aureus (BCID) NOT DETECTED NOT DETECTED Final   Methicillin resistance DETECTED (A) NOT DETECTED Final    Comment: CRITICAL RESULT CALLED TO, READ BACK BY AND VERIFIED WITH: C HOPKINS RN 09/01/18 2220 JDW    Streptococcus species NOT DETECTED NOT DETECTED Final   Streptococcus agalactiae NOT DETECTED NOT DETECTED Final   Streptococcus pneumoniae NOT DETECTED NOT DETECTED  Final   Streptococcus pyogenes NOT DETECTED NOT DETECTED Final   Acinetobacter baumannii NOT DETECTED NOT DETECTED Final   Enterobacteriaceae species NOT DETECTED NOT DETECTED Final   Enterobacter cloacae complex NOT DETECTED NOT DETECTED Final   Escherichia coli NOT DETECTED NOT DETECTED Final   Klebsiella oxytoca NOT DETECTED NOT DETECTED Final   Klebsiella pneumoniae NOT DETECTED NOT DETECTED Final   Proteus species NOT DETECTED NOT DETECTED Final   Serratia marcescens NOT DETECTED NOT DETECTED Final   Haemophilus influenzae NOT DETECTED NOT DETECTED Final   Neisseria meningitidis NOT DETECTED NOT  DETECTED Final   Pseudomonas aeruginosa NOT DETECTED NOT DETECTED Final   Candida albicans NOT DETECTED NOT DETECTED Final   Candida glabrata NOT DETECTED NOT DETECTED Final   Candida krusei NOT DETECTED NOT DETECTED Final   Candida parapsilosis NOT DETECTED NOT DETECTED Final   Candida tropicalis NOT DETECTED NOT DETECTED Final    Comment: Performed at [facility], [place] [address] [place], [place] [address]  Blood Culture (routine x 2)     Status: None (Preliminary result)   Collection Time: 08/31/18 10:04 PM   Specimen: BLOOD RIGHT ARM  Result Value Ref Range Status   Specimen Description BLOOD RIGHT ARM  Final   Special Requests   Final    BOTTLES DRAWN AEROBIC AND ANAEROBIC Blood Culture adequate volume   Culture   Final    NO GROWTH 4 DAYS Performed at [facility], [address] [place], [place] [address]    Report Status PENDING  Incomplete  Culture, respiratory     Status: None   Collection Time: 09/01/18 11:27 AM  Result Value Ref Range Status   Specimen Description   Final    EXPECTORATED SPUTUM Performed at [facility], [address], [place], [place] [address]    Special Requests   Final    NONE Performed at [facility], [address] [place], [place] [address]    Gram Stain   Final    FEW WBC PRESENT,BOTH PMN AND MONONUCLEAR FEW GRAM POSITIVE COCCI FEW  GRAM VARIABLE ROD ABUNDANT YEAST ABUNDANT SQUAMOUS EPITHELIAL CELLS PRESENT    Culture   Final    Consistent with normal respiratory flora. Performed at [facility], [place] [address] [place], [place] [address]    Report Status 09/03/2018 FINAL  Final  MRSA PCR Screening     Status: None   Collection Time: 09/01/18  8:39 PM   Specimen: Nasal Mucosa; Nasopharyngeal  Result Value Ref Range Status   MRSA by PCR NEGATIVE NEGATIVE Final    Comment:        The GeneXpert MRSA Assay (FDA approved for NASAL specimens only), is one component of a comprehensive MRSA colonization surveillance program. It is not intended to diagnose MRSA infection nor to guide or monitor treatment for MRSA infections. Performed at [facility], [address], [place], [place] [address]      Studies: Dg Chest [facility] 1 View  Result Date: 09/04/2018 CLINICAL DATA:  Acute and chronic respiratory failure with hypoxia. Small-cell lung cancer. COPD. EXAM: PORTABLE CHEST 1 VIEW COMPARISON:  09/03/2018 FINDINGS: Right subclavian catheter tip at low SVC. Patient rotated right. Numerous leads and wires project over the chest. Mild cardiomegaly. Chronic costophrenic angle blunting may be slightly improved. No pneumothorax. Diffuse marked interstitial opacities throughout both lungs. Possible somewhat more confluent opacity in the right lower lobe laterally, not significantly changed. IMPRESSION: No significant change since 1 day prior. Diffuse interstitial prominence is likely related to COPD/chronic bronchitis. Superimposed pulmonary edema or atypical infection cannot be excluded. Suspect somewhat more confluent right lower lobe airspace disease, similar. This may represent an area of lobar pneumonia. Electronically Signed   By: Abigail Miyamoto M.D.   On: 09/04/2018 09:45   Dg Chest Port 1 View  Result Date: 09/03/2018 CLINICAL DATA:  Pneumonia. EXAM: PORTABLE CHEST 1 VIEW COMPARISON:  Three days ago FINDINGS: Coarse  interstitial opacities with mildly low lung volumes. 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 There is history of pneumonia which could be easily obscured.  Chronic costophrenic sulcus blunting. No pneumothorax. Right subclavian porta catheter in good position. IMPRESSION: Stable from 3 days ago. Chronic fibrotic and emphysematous lung disease with possible superimposed pneumonia Electronically Signed   By: Monte Fantasia M.D.   On: 09/03/2018 05:59   Scheduled Meds: . aspirin EC  81 mg Oral Daily  . Chlorhexidine Gluconate Cloth  6 each Topical Q0600  . cyclobenzaprine  5 mg Oral TID  . enoxaparin (LOVENOX) injection  40 mg Subcutaneous Q24H  . ferrous sulfate  325 mg Oral Daily  . fluticasone  2 spray Each Nare Daily  . fluticasone furoate-vilanterol  1 puff Inhalation Daily  . gabapentin  300 mg Oral TID  . ipratropium  0.5 mg Nebulization Q6H  . levalbuterol  1.25 mg Nebulization Q6H  . methylPREDNISolone (SOLU-MEDROL) injection  60 mg Intravenous Q6H  . metoCLOPramide  5 mg Oral TID AC & HS  . mirabegron ER  50 mg Oral Daily  . montelukast  10 mg Oral Daily  . multivitamin with minerals  1 tablet Oral Daily  . oxyCODONE  10 mg Oral Q4H  . pantoprazole  40 mg Oral Daily  . simvastatin  10 mg Oral QHS  . vitamin C  500 mg Oral Daily   Continuous Infusions: . sodium chloride Stopped (09/03/18 0532)  . ceFEPime (MAXIPIME) IV 2 g (09/04/18 4193)    Active Problems:   Tobacco abuse   COPD (chronic obstructive pulmonary disease) (HCC)   Essential hypertension   Abnormal chest x-ray   Mass of lower lobe of left lung   Non-small cell carcinoma of left lung (HCC)   Malignant neoplasm of left lung (HCC)   HCAP (healthcare-associated pneumonia)   Acute and chronic respiratory failure with hypoxia [facility])   Critical Care Time spent: 30 minutes   Irwin Brakeman, MD [facility] 09/04/2018, 10:00 AM    LOS: 3 days  How to contact the [facility] Attending or Consulting provider [place]  or covering provider during after hours [place], for this patient?  1. Check the care team in [facility] and look for a) attending/consulting [facility] provider listed and b) the [facility] team listed 2. Log into [email or web address] and use 's universal password to access. If you do not have the password, please contact the hospital operator. 3. Locate the [facility] provider you are looking for under [facility] and page to a number that you can be directly reached. 4. If you still have difficulty reaching the provider, please page the [facility] (Director on Call) for the Hospitalists listed on amion for assistance.

## 2018-09-04 NOTE — Progress Notes (Signed)
Patient appears to be doing well with out BiPAP he is on 12 liter high flow. Not sure who turned it up to 12 but its not a problem. He received some sedation Ativan  and is asleep, saturation is 97. He appears comfortable. Will hold BiPAP for now.[name]

## 2018-09-05 LAB — CULTURE, BLOOD (ROUTINE X 2)
Culture: NO GROWTH
Special Requests: ADEQUATE

## 2018-09-05 MED ORDER — NICOTINE 14 MG/24HR TD PT24
14.0000 mg | MEDICATED_PATCH | Freq: Every day | TRANSDERMAL | Status: DC
  Administered 2018-09-05 – 2018-09-09 (×3): 14 mg via TRANSDERMAL
  Filled 2018-09-05 (×5): qty 1

## 2018-09-05 MED ORDER — FLUCONAZOLE IN SODIUM CHLORIDE 200-0.9 MG/100ML-% IV SOLN
200.0000 mg | INTRAVENOUS | Status: AC
  Administered 2018-09-05 – 2018-09-08 (×4): 200 mg via INTRAVENOUS
  Filled 2018-09-05 (×5): qty 100

## 2018-09-05 MED ORDER — AMLODIPINE BESYLATE 5 MG PO TABS
5.0000 mg | ORAL_TABLET | Freq: Every day | ORAL | Status: DC
  Administered 2018-09-05 – 2018-09-09 (×4): 5 mg via ORAL
  Filled 2018-09-05 (×5): qty 1

## 2018-09-05 NOTE — Progress Notes (Signed)
PROGRESS NOTE    Bradley [name]  IRS:[id number]  DOB: [birth year]  DOA: 08/31/2018 PCP: Jani Gravel, MD   Brief Admission Hx: 78 y.o. male, with history of non-small cell lung cancer, COPD, CAD, HLD, anemia, and hypertension who presents to the ED today via EMS for shortness of breath and recurrent HCAP.   MDM/Assessment & Plan:   1. Acute on chronic hypoxic respiratory failure-he had initially started showing improvement but had taken a turn for the worse and was briefly placed on bipap and was transferred to the stepdown ICU and started on IV steroids.  Appreciate pulmonary consultation and assistance from Dr. Luan Pulling.  He is of bipap now and on HFNC.  He does not want to be intubated.  I confirmed with his daughter.  Morphine ordered for dyspnea.  I will ask for palliative care team to see 8/10.  2. Sepsis secondary to HCAP - Resolved now.  Continue supportive care.   3. HCAP - continue cefepime.  DC vanc as MRSA screen negative. Coag neg staph seen in 1/2 blood culture likely contaminant.   Pleurx cath that was removed 08/25/18 for a malignant pleural effusion that had resolved.   Sputum culture with abundant yeast, will start fluconazole.  4. Hyponatremia - resolved.  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  5. Non small cell lung cancer - he will need to follow up with oncologist after discharge. He was seen on 08/24/18. They are planning CT CAP in next week prior to appt next week.  6. S/p recent Pleuryx cath removal - sutures have been removed.  7. Essential hypertension - resume amlodipine 5 mg daily.   DVT prophylaxis: lovenox Code Status: DNR Family Communication: patient bedside update/daughter updated telephone Disposition Plan: daughter wants to pursue SNF if patient survives hospitalization  Consultants:  Pulmonology  Procedures:    Antimicrobials:  Cefepime  Subjective: Pt wants to eat some breakfast, says his breathing is a little better.   Objective: Vitals:    09/05/18 0202 09/05/18 0300 09/05/18 0400 09/05/18 0500  BP:  (!) 141/77 134/81   Pulse:  87 88   Resp:  17 15   Temp:   98.1 F (36.7 C)   TempSrc:   Axillary   SpO2: 97% 99% 98%   Weight:    66.8 kg  Height:        Intake/Output Summary (Last 24 hours) at 09/05/2018 1103 Last data filed at 09/05/2018 0500 Gross per 24 hour  Intake 1072.82 ml  Output 1320 ml  Net -247.18 ml   Filed Weights   08/31/18 2210 09/01/18 2005 09/05/18 0500  Weight: 68 kg 67.7 kg 66.8 kg    REVIEW OF SYSTEMS  Unable to obtain due to condition  Exam:  General exam: elderly male on bipap, appears chronically ill.   Respiratory system: diffuse rales poor air movement.  Moderate increased work of breathing with tachypnea. Cardiovascular system: S1 & S2 heard. No JVD, murmurs, gallops, clicks or pedal edema. Gastrointestinal system: Abdomen is nondistended, soft and nontender. Normal bowel sounds heard. Central nervous system: lethargic but arousable. No focal neurological deficits. Extremities: no CCE.  Data Reviewed: Basic Metabolic Panel: Recent Labs  Lab 08/31/18 2203 09/02/18 0917 09/03/18 0512 09/04/18 0442  NA 133* 133* 131* 136  K 3.7 3.7 3.7 3.9  CL 102 102 99 102  CO2 21* 23 23 24   GLUCOSE 126* 100* 101* 133*  BUN 19 14 15  26*  CREATININE 1.24 0.83 0.85 0.79  CALCIUM 8.1* 8.3* 8.5*  8.7*   Liver Function Tests: Recent Labs  Lab 08/31/18 2203 09/02/18 0917 09/03/18 0512  AST 14* 15 14*  ALT 10 9 10   ALKPHOS 31* 23* 23*  BILITOT 0.7 1.3* 1.0  PROT 8.3* 8.1 7.8  ALBUMIN 2.6* 2.5* 2.4*   No results for input(s): LIPASE, AMYLASE in the last 168 hours. No results for input(s): AMMONIA in the last 168 hours. CBC: Recent Labs  Lab 08/31/18 2203 09/02/18 0917 09/03/18 0512 09/04/18 0442  WBC 16.7* 12.0* 15.0* 10.9*  NEUTROABS 12.9*  --   --   --   HGB 13.6 12.9* 13.0 12.4*  HCT 44.3 41.4 41.3 39.0  MCV 91.3 91.4 90.0 88.8  PLT 590* 554* 529* 525*   Cardiac  Enzymes: No results for input(s): CKTOTAL, CKMB, CKMBINDEX, TROPONINI in the last 168 hours. CBG (last 3)  No results for input(s): GLUCAP in the last 72 hours. Recent Results (from the past 240 hour(s))  Urine culture     Status: None   Collection Time: 08/31/18  9:45 PM   Specimen: In/Out Cath Urine  Result Value Ref Range Status   Specimen Description   Final    IN/OUT CATH URINE Performed at [facility], [address] [place], [place] [address]    Special Requests   Final    NONE Performed at [facility], [address], [place], [place] [address]    Culture   Final    NO GROWTH Performed at [facility], [place] [address] [place], [place] [address]    Report Status 09/02/2018 FINAL  Final  SARS Coronavirus 2 [facility] order, Performed in [facility] hospital lab) Nasopharyngeal Nasopharyngeal Swab     Status: None   Collection Time: 08/31/18  9:46 PM   Specimen: Nasopharyngeal Swab  Result Value Ref Range Status   SARS Coronavirus 2 NEGATIVE NEGATIVE Final    Comment: (NOTE) If result is NEGATIVE SARS-CoV-2 target nucleic acids are NOT DETECTED. The SARS-CoV-2 RNA is generally detectable in upper and lower  respiratory specimens during the acute phase of infection. The lowest  concentration of SARS-CoV-2 viral copies this assay can detect is 250  copies / mL. A negative result does not preclude SARS-CoV-2 infection  and should not be used as the sole basis for treatment or other  patient management decisions.  A negative result may occur with  improper specimen collection / handling, submission of specimen other  than nasopharyngeal swab, presence of viral mutation(s) within the  areas targeted by this assay, and inadequate number of viral copies  (<250 copies / mL). A negative result must be combined with clinical  observations, patient history, and epidemiological information. If result is POSITIVE SARS-CoV-2 target nucleic acids are DETECTED. The SARS-CoV-2  RNA is generally detectable in upper and lower  respiratory specimens dur ing the acute phase of infection.  Positive  results are indicative of active infection with SARS-CoV-2.  Clinical  correlation with patient history and other diagnostic information is  necessary to determine patient infection status.  Positive results do  not rule out bacterial infection or co-infection with other viruses. If result is PRESUMPTIVE POSTIVE SARS-CoV-2 nucleic acids MAY BE PRESENT.   A presumptive positive result was obtained on the submitted specimen  and confirmed on repeat testing.  While 2019 novel coronavirus  (SARS-CoV-2) nucleic acids may be present in the submitted sample  additional confirmatory testing may be necessary for epidemiological  and / or clinical management purposes  to differentiate between  SARS-CoV-2 and other Sarbecovirus currently known to infect humans.  If clinically indicated additional testing with an alternate test  methodology [phone]) is advised. The SARS-CoV-2 RNA is generally  detectable in upper and lower respiratory sp ecimens during the acute  phase of infection. The expected result is Negative. Fact Sheet for Patients:  StrictlyIdeas.no Fact Sheet for Healthcare Providers: BankingDealers.co.za This test is not yet approved or cleared by the [place] FDA and has been authorized for detection and/or diagnosis of SARS-CoV-2 by FDA under an Emergency Use Authorization (EUA).  This EUA will remain in effect (meaning this test can be used) for the duration of the COVID-19 declaration under Section 564(b)(1) of the Act, 21 U.S.C. section 360bbb-3(b)(1), unless the authorization is terminated or revoked sooner. Performed at [facility], [address], [place], [place] [address]   Blood Culture (routine x 2)     Status: Abnormal   Collection Time: 08/31/18 10:03 PM   Specimen: BLOOD RIGHT HAND  Result Value Ref  Range Status   Specimen Description   Final    BLOOD RIGHT HAND Performed at [facility], [address], [place], [place] [address]    Special Requests   Final    BOTTLES DRAWN AEROBIC AND ANAEROBIC Blood Culture adequate volume Performed at [facility], [address] [place], [place] [address]    Culture  Setup Time   Final    GRAM POSITIVE COCCI IN BOTH AEROBIC AND ANAEROBIC BOTTLES Gram Stain Report Called to,Read Back By and Verified With: MINTER,R ON 09/01/18 AT 48 BY LOY,C PERFORMED AT [facility] CRITICAL RESULT CALLED TO, READ BACK BY AND VERIFIED WITH: C HOPKINS RN 09/01/18 2220 JDW    Culture (A)  Final    STAPHYLOCOCCUS SPECIES (COAGULASE NEGATIVE) THE SIGNIFICANCE OF ISOLATING THIS ORGANISM FROM A SINGLE SET OF BLOOD CULTURES WHEN MULTIPLE SETS ARE DRAWN IS UNCERTAIN. PLEASE NOTIFY THE MICROBIOLOGY DEPARTMENT WITHIN ONE WEEK IF SPECIATION AND SENSITIVITIES ARE REQUIRED. Performed at [facility], [place] [address] [place], [place] [address]    Report Status 09/03/2018 FINAL  Final  Blood Culture ID Panel (Reflexed)     Status: Abnormal   Collection Time: 08/31/18 10:03 PM  Result Value Ref Range Status   Enterococcus species NOT DETECTED NOT DETECTED Final   Listeria monocytogenes NOT DETECTED NOT DETECTED Final   Staphylococcus species DETECTED (A) NOT DETECTED Final    Comment: Methicillin (oxacillin) resistant coagulase negative staphylococcus. Possible blood culture contaminant (unless isolated from more than one blood culture draw or clinical case suggests pathogenicity). No antibiotic treatment is indicated for blood  culture contaminants. CRITICAL RESULT CALLED TO, READ BACK BY AND VERIFIED WITH: C HOPKINS RN 09/01/18 2220 JDW    Staphylococcus aureus (BCID) NOT DETECTED NOT DETECTED Final   Methicillin resistance DETECTED (A) NOT DETECTED Final    Comment: CRITICAL RESULT CALLED TO, READ BACK BY AND VERIFIED WITH: C HOPKINS RN 09/01/18 2220 JDW    Streptococcus  species NOT DETECTED NOT DETECTED Final   Streptococcus agalactiae NOT DETECTED NOT DETECTED Final   Streptococcus pneumoniae NOT DETECTED NOT DETECTED Final   Streptococcus pyogenes NOT DETECTED NOT DETECTED Final   Acinetobacter baumannii NOT DETECTED NOT DETECTED Final   Enterobacteriaceae species NOT DETECTED NOT DETECTED Final   Enterobacter cloacae complex NOT DETECTED NOT DETECTED Final   Escherichia coli NOT DETECTED NOT DETECTED Final   Klebsiella oxytoca NOT DETECTED NOT DETECTED Final   Klebsiella pneumoniae NOT DETECTED NOT DETECTED Final   Proteus  species NOT DETECTED NOT DETECTED Final   Serratia marcescens NOT DETECTED NOT DETECTED Final   Haemophilus influenzae NOT DETECTED NOT DETECTED Final   Neisseria meningitidis NOT DETECTED NOT DETECTED Final   Pseudomonas aeruginosa NOT DETECTED NOT DETECTED Final   Candida albicans NOT DETECTED NOT DETECTED Final   Candida glabrata NOT DETECTED NOT DETECTED Final   Candida krusei NOT DETECTED NOT DETECTED Final   Candida parapsilosis NOT DETECTED NOT DETECTED Final   Candida tropicalis NOT DETECTED NOT DETECTED Final    Comment: Performed at [facility], [place] [address] [place], [place] [address]  Blood Culture (routine x 2)     Status: None (Preliminary result)   Collection Time: 08/31/18 10:04 PM   Specimen: BLOOD RIGHT ARM  Result Value Ref Range Status   Specimen Description BLOOD RIGHT ARM  Final   Special Requests   Final    BOTTLES DRAWN AEROBIC AND ANAEROBIC Blood Culture adequate volume   Culture   Final    NO GROWTH 4 DAYS Performed at [facility], [address], [place], [place] [address]    Report Status PENDING  Incomplete  Culture, respiratory     Status: None   Collection Time: 09/01/18 11:27 AM  Result Value Ref Range Status   Specimen Description   Final    EXPECTORATED SPUTUM Performed at [facility], [address], [place], [place] [address]    Special Requests   Final    NONE Performed at  [facility], [address], [place], [place] [address]    Gram Stain   Final    FEW WBC PRESENT,BOTH PMN AND MONONUCLEAR FEW GRAM POSITIVE COCCI FEW GRAM VARIABLE ROD ABUNDANT YEAST ABUNDANT SQUAMOUS EPITHELIAL CELLS PRESENT    Culture   Final    Consistent with normal respiratory flora. Performed at [facility], [place] [address] [place], [place] [address]    Report Status 09/03/2018 FINAL  Final  MRSA PCR Screening     Status: None   Collection Time: 09/01/18  8:39 PM   Specimen: Nasal Mucosa; Nasopharyngeal  Result Value Ref Range Status   MRSA by PCR NEGATIVE NEGATIVE Final    Comment:        The GeneXpert MRSA Assay (FDA approved for NASAL specimens only), is one component of a comprehensive MRSA colonization surveillance program. It is not intended to diagnose MRSA infection nor to guide or monitor treatment for MRSA infections. Performed at [facility], [address] [place], [place] [address]      Studies: Dg Chest [facility] 1 View  Result Date: 09/04/2018 CLINICAL DATA:  Acute and chronic respiratory failure with hypoxia. Small-cell lung cancer. COPD. EXAM: PORTABLE CHEST 1 VIEW COMPARISON:  09/03/2018 FINDINGS: Right subclavian catheter tip at low SVC. Patient rotated right. Numerous leads and wires project over the chest. Mild cardiomegaly. Chronic costophrenic angle blunting may be slightly improved. No pneumothorax. Diffuse marked interstitial opacities throughout both lungs. Possible somewhat more confluent opacity in the right lower lobe laterally, not significantly changed. IMPRESSION: No significant change since 1 day prior. Diffuse interstitial prominence is likely related to COPD/chronic bronchitis. Superimposed pulmonary edema or atypical infection cannot be excluded. Suspect somewhat more confluent right lower lobe airspace disease, similar. This may represent an area of lobar pneumonia. Electronically Signed   By: Abigail Miyamoto M.D.   On: 09/04/2018 09:45    Scheduled Meds: . aspirin EC  81 mg Oral Daily  . Chlorhexidine Gluconate Cloth  6 each  Topical Q0600  . enoxaparin (LOVENOX) injection  40 mg Subcutaneous Q24H  . ferrous sulfate  325 mg Oral Daily  . fluticasone  2 spray Each Nare Daily  . fluticasone furoate-vilanterol  1 puff Inhalation Daily  . gabapentin  300 mg Oral TID  . ipratropium  0.5 mg Nebulization Q6H  . levalbuterol  1.25 mg Nebulization Q6H  . methylPREDNISolone (SOLU-MEDROL) injection  60 mg Intravenous Q6H  . metoCLOPramide  5 mg Oral TID AC & HS  . mirabegron ER  50 mg Oral Daily  . montelukast  10 mg Oral Daily  . multivitamin with minerals  1 tablet Oral Daily  . nicotine  14 mg Transdermal Daily  . oxyCODONE  10 mg Oral Q4H  . pantoprazole  40 mg Oral Daily  . simvastatin  10 mg Oral QHS  . vitamin C  500 mg Oral Daily   Continuous Infusions: . sodium chloride Stopped (09/03/18 0532)  . ceFEPime (MAXIPIME) IV 2 g (09/05/18 0742)    Active Problems:   Tobacco abuse   COPD (chronic obstructive pulmonary disease) (HCC)   Essential hypertension   Abnormal chest x-ray   Mass of lower lobe of left lung   Non-small cell carcinoma of left lung (HCC)   Malignant neoplasm of left lung (HCC)   HCAP (healthcare-associated pneumonia)   Acute and chronic respiratory failure with hypoxia [facility])   Critical Care Time spent: 31 minutes   Irwin Brakeman, MD [facility] 09/05/2018, 11:03 AM    LOS: 4 days  How to contact the [facility] Attending or Consulting provider [place] or covering provider during after hours [place], for this patient?  1. Check the care team in [facility] and look for a) attending/consulting [facility] provider listed and b) the [facility] team listed 2. Log into [email or web address] and use [place]'s universal password to access. If you do not have the password, please contact the hospital operator. 3. Locate the [facility] provider you are looking for under [facility] and page to a number that you can be directly  reached. 4. If you still have difficulty reaching the provider, please page the [facility] (Director on Call) for the Hospitalists listed on amion for assistance.

## 2018-09-05 NOTE — Progress Notes (Addendum)
Patient has been off BiPAP through out night. AT 5 am he decided he would get on side of bed and urinate. He accomplished his goal even though his saturation dropped around 89 to 88. This occurred over about 10 minutes with some physical exertion.  Suspect he is to a point he can stay off of BiPAP. He was on 12 liters through out his event. Will continue titration of oxygen. He was monitored through out event.

## 2018-09-05 NOTE — Progress Notes (Signed)
Patient still appears well slept through breathing treatment. Is able to arouse.Still off BiPAP

## 2018-09-05 NOTE — Progress Notes (Signed)
Subjective: He looks substantially better this morning.  He is less short of breath.  His respiratory rate is decreased.  Oxygenation is better on high flow nasal cannula.  He is coughing nonproductively.  He says he wants a cigarette  Objective: Vital signs in last 24 hours: Temp:  [97.4 F (36.3 C)-98.1 F (36.7 C)] 98.1 F (36.7 C) (08/09 0400) Pulse Rate:  [87-107] 88 (08/09 0400) Resp:  [15-27] 15 (08/09 0400) BP: (118-176)/(77-122) 134/81 (08/09 0400) SpO2:  [91 %-99 %] 98 % (08/09 0400) Weight:  [66.8 kg] 66.8 kg (08/09 0500) Weight change:  Last BM Date: 08/30/18  Intake/Output from previous day: 08/08 0701 - 08/09 0700 In: 1272.8 [P.O.:720; I.V.:252.8; IV Piggyback:300] Out: 1320 [Urine:1320]  PHYSICAL EXAM General appearance: alert, cooperative and mild distress Resp: rales bilaterally and rhonchi bilaterally Cardio: regular rate and rhythm, S1, S2 normal, no murmur, click, rub or gallop GI: soft, non-tender; bowel sounds normal; no masses,  no organomegaly Extremities: extremities normal, atraumatic, no cyanosis or edema  Lab Results:  Results for orders placed or performed during the hospital encounter of 08/31/18 (from the past 48 hour(s))  CBC     Status: Abnormal   Collection Time: 09/04/18  4:42 AM  Result Value Ref Range   WBC 10.9 (H) 4.0 - 10.5 K/uL   RBC 4.39 4.22 - 5.81 MIL/uL   Hemoglobin 12.4 (L) 13.0 - 17.0 g/dL   HCT 39.0 39.0 - 52.0 %   MCV 88.8 80.0 - 100.0 fL   MCH 28.2 26.0 - 34.0 pg   MCHC 31.8 30.0 - 36.0 g/dL   RDW 16.4 (H) 11.5 - 15.5 %   Platelets 525 (H) 150 - 400 K/uL   nRBC 0.0 0.0 - 0.2 %    Comment: Performed at [facility], [address], [place], [place] [address]  Basic metabolic panel     Status: Abnormal   Collection Time: 09/04/18  4:42 AM  Result Value Ref Range   Sodium 136 135 - 145 mmol/L   Potassium 3.9 3.5 - 5.1 mmol/L   Chloride 102 98 - 111 mmol/L   CO2 24 22 - 32 mmol/L   Glucose, Bld 133 (H) 70 - 99 mg/dL    BUN 26 (H) 8 - 23 mg/dL   Creatinine, Ser 0.79 0.61 - 1.24 mg/dL   Calcium 8.7 (L) 8.9 - 10.3 mg/dL   GFR calc non Af Amer >60 >60 mL/min   GFR calc Af Amer >60 >60 mL/min   Anion gap 10 5 - 15    Comment: Performed at [facility], [address], [place], [place] [address]    ABGS No results for input(s): PHART, PO2ART, TCO2, HCO3 in the last 72 hours.  Invalid input(s): PCO2 CULTURES Recent Results (from the past 240 hour(s))  Urine culture     Status: None   Collection Time: 08/31/18  9:45 PM   Specimen: In/Out Cath Urine  Result Value Ref Range Status   Specimen Description   Final    IN/OUT CATH URINE Performed at [facility], [address] [place], [place] [address]    Special Requests   Final    NONE Performed at [facility], [address], [place], [place] [address]    Culture   Final    NO GROWTH Performed at [facility], [place] [address] [place], [place] [address]    Report Status 09/02/2018 FINAL  Final  SARS Coronavirus 2 [facility] order, Performed in [facility] hospital  lab) Nasopharyngeal Nasopharyngeal Swab     Status: None   Collection Time: 08/31/18  9:46 PM   Specimen: Nasopharyngeal Swab  Result Value Ref Range Status   SARS Coronavirus 2 NEGATIVE NEGATIVE Final    Comment: (NOTE) If result is NEGATIVE SARS-CoV-2 target nucleic acids are NOT DETECTED. The SARS-CoV-2 RNA is generally detectable in upper and lower  respiratory specimens during the acute phase of infection. The lowest  concentration of SARS-CoV-2 viral copies this assay can detect is 250  copies / mL. A negative result does not preclude SARS-CoV-2 infection  and should not be used as the sole basis for treatment or other  patient management decisions.  A negative result may occur with  improper specimen collection / handling, submission of specimen other  than nasopharyngeal swab, presence of viral mutation(s) within the  areas targeted by this assay, and inadequate number of  viral copies  (<250 copies / mL). A negative result must be combined with clinical  observations, patient history, and epidemiological information. If result is POSITIVE SARS-CoV-2 target nucleic acids are DETECTED. The SARS-CoV-2 RNA is generally detectable in upper and lower  respiratory specimens dur ing the acute phase of infection.  Positive  results are indicative of active infection with SARS-CoV-2.  Clinical  correlation with patient history and other diagnostic information is  necessary to determine patient infection status.  Positive results do  not rule out bacterial infection or co-infection with other viruses. If result is PRESUMPTIVE POSTIVE SARS-CoV-2 nucleic acids MAY BE PRESENT.   A presumptive positive result was obtained on the submitted specimen  and confirmed on repeat testing.  While 2019 novel coronavirus  (SARS-CoV-2) nucleic acids may be present in the submitted sample  additional confirmatory testing may be necessary for epidemiological  and / or clinical management purposes  to differentiate between  SARS-CoV-2 and other Sarbecovirus currently known to infect humans.  If clinically indicated additional testing with an alternate test  methodology [phone]) is advised. The SARS-CoV-2 RNA is generally  detectable in upper and lower respiratory sp ecimens during the acute  phase of infection. The expected result is Negative. Fact Sheet for Patients:  StrictlyIdeas.no Fact Sheet for Healthcare Providers: BankingDealers.co.za This test is not yet approved or cleared by the [place] FDA and has been authorized for detection and/or diagnosis of SARS-CoV-2 by FDA under an Emergency Use Authorization (EUA).  This EUA will remain in effect (meaning this test can be used) for the duration of the COVID-19 declaration under Section 564(b)(1) of the Act, 21 U.S.C. section 360bbb-3(b)(1), unless the authorization is  terminated or revoked sooner. Performed at [facility], [address], [place], [place] [address]   Blood Culture (routine x 2)     Status: Abnormal   Collection Time: 08/31/18 10:03 PM   Specimen: BLOOD RIGHT HAND  Result Value Ref Range Status   Specimen Description   Final    BLOOD RIGHT HAND Performed at [facility], [address], [place], [place] [address]    Special Requests   Final    BOTTLES DRAWN AEROBIC AND ANAEROBIC Blood Culture adequate volume Performed at [facility], [address] [place], [place] [address]    Culture  Setup Time   Final    GRAM POSITIVE COCCI IN BOTH AEROBIC AND ANAEROBIC BOTTLES Gram Stain Report Called to,Read Back By and Verified With: MINTER,R ON 09/01/18 AT 1740 BY LOY,C PERFORMED AT [facility] CRITICAL RESULT CALLED TO, READ BACK BY AND VERIFIED WITH: C  HOPKINS RN 09/01/18 2220 JDW    Culture (A)  Final    STAPHYLOCOCCUS SPECIES (COAGULASE NEGATIVE) THE SIGNIFICANCE OF ISOLATING THIS ORGANISM FROM A SINGLE SET OF BLOOD CULTURES WHEN MULTIPLE SETS ARE DRAWN IS UNCERTAIN. PLEASE NOTIFY THE MICROBIOLOGY DEPARTMENT WITHIN ONE WEEK IF SPECIATION AND SENSITIVITIES ARE REQUIRED. Performed at [facility], [place] [address] [place], [place] [address]    Report Status 09/03/2018 FINAL  Final  Blood Culture ID Panel (Reflexed)     Status: Abnormal   Collection Time: 08/31/18 10:03 PM  Result Value Ref Range Status   Enterococcus species NOT DETECTED NOT DETECTED Final   Listeria monocytogenes NOT DETECTED NOT DETECTED Final   Staphylococcus species DETECTED (A) NOT DETECTED Final    Comment: Methicillin (oxacillin) resistant coagulase negative staphylococcus. Possible blood culture contaminant (unless isolated from more than one blood culture draw or clinical case suggests pathogenicity). No antibiotic treatment is indicated for blood  culture contaminants. CRITICAL RESULT CALLED TO, READ BACK BY AND VERIFIED WITH: C HOPKINS RN 09/01/18 2220 JDW     Staphylococcus aureus (BCID) NOT DETECTED NOT DETECTED Final   Methicillin resistance DETECTED (A) NOT DETECTED Final    Comment: CRITICAL RESULT CALLED TO, READ BACK BY AND VERIFIED WITH: C HOPKINS RN 09/01/18 2220 JDW    Streptococcus species NOT DETECTED NOT DETECTED Final   Streptococcus agalactiae NOT DETECTED NOT DETECTED Final   Streptococcus pneumoniae NOT DETECTED NOT DETECTED Final   Streptococcus pyogenes NOT DETECTED NOT DETECTED Final   Acinetobacter baumannii NOT DETECTED NOT DETECTED Final   Enterobacteriaceae species NOT DETECTED NOT DETECTED Final   Enterobacter cloacae complex NOT DETECTED NOT DETECTED Final   Escherichia coli NOT DETECTED NOT DETECTED Final   Klebsiella oxytoca NOT DETECTED NOT DETECTED Final   Klebsiella pneumoniae NOT DETECTED NOT DETECTED Final   Proteus species NOT DETECTED NOT DETECTED Final   Serratia marcescens NOT DETECTED NOT DETECTED Final   Haemophilus influenzae NOT DETECTED NOT DETECTED Final   Neisseria meningitidis NOT DETECTED NOT DETECTED Final   Pseudomonas aeruginosa NOT DETECTED NOT DETECTED Final   Candida albicans NOT DETECTED NOT DETECTED Final   Candida glabrata NOT DETECTED NOT DETECTED Final   Candida krusei NOT DETECTED NOT DETECTED Final   Candida parapsilosis NOT DETECTED NOT DETECTED Final   Candida tropicalis NOT DETECTED NOT DETECTED Final    Comment: Performed at [facility], [place] [address] [place], [place] [address]  Blood Culture (routine x 2)     Status: None (Preliminary result)   Collection Time: 08/31/18 10:04 PM   Specimen: BLOOD RIGHT ARM  Result Value Ref Range Status   Specimen Description BLOOD RIGHT ARM  Final   Special Requests   Final    BOTTLES DRAWN AEROBIC AND ANAEROBIC Blood Culture adequate volume   Culture   Final    NO GROWTH 4 DAYS Performed at [facility], [address], [place], [place] [address]    Report Status PENDING  Incomplete  Culture, respiratory     Status: None    Collection Time: 09/01/18 11:27 AM  Result Value Ref Range Status   Specimen Description   Final    EXPECTORATED SPUTUM Performed at [facility], [address], [place], [place] [address]    Special Requests   Final    NONE Performed at [facility], [address], [place], [place] [address]    Gram Stain   Final    FEW WBC PRESENT,BOTH PMN AND MONONUCLEAR  FEW GRAM POSITIVE COCCI FEW GRAM VARIABLE ROD ABUNDANT YEAST ABUNDANT SQUAMOUS EPITHELIAL CELLS PRESENT    Culture   Final    Consistent with normal respiratory flora. Performed at [facility], [place] [address] [place], [place] [address]    Report Status 09/03/2018 FINAL  Final  MRSA PCR Screening     Status: None   Collection Time: 09/01/18  8:39 PM   Specimen: Nasal Mucosa; Nasopharyngeal  Result Value Ref Range Status   MRSA by PCR NEGATIVE NEGATIVE Final    Comment:        The GeneXpert MRSA Assay (FDA approved for NASAL specimens only), is one component of a comprehensive MRSA colonization surveillance program. It is not intended to diagnose MRSA infection nor to guide or monitor treatment for MRSA infections. Performed at [facility], [address], [place], [place] [address]    Studies/Results: Dg Chest Port 1 View  Result Date: 09/04/2018 CLINICAL DATA:  Acute and chronic respiratory failure with hypoxia. Small-cell lung cancer. COPD. EXAM: PORTABLE CHEST 1 VIEW COMPARISON:  09/03/2018 FINDINGS: Right subclavian catheter tip at low SVC. Patient rotated right. Numerous leads and wires project over the chest. Mild cardiomegaly. Chronic costophrenic angle blunting may be slightly improved. No pneumothorax. Diffuse marked interstitial opacities throughout both lungs. Possible somewhat more confluent opacity in the right lower lobe laterally, not significantly changed. IMPRESSION: No significant change since 1 day prior. Diffuse interstitial prominence is likely related to COPD/chronic bronchitis. Superimposed  pulmonary edema or atypical infection cannot be excluded. Suspect somewhat more confluent right lower lobe airspace disease, similar. This may represent an area of lobar pneumonia. Electronically Signed   By: Abigail Miyamoto M.D.   On: 09/04/2018 09:45    Medications:  Prior to Admission:  Medications Prior to Admission  Medication Sig Dispense Refill Last Dose  . ADVAIR HFA 115-21 MCG/ACT inhaler Inhale 2 puffs into the lungs 2 (two) times daily. 1 Inhaler 4 08/31/2018 at Unknown time  . amLODipine (NORVASC) 5 MG tablet TAKE 1 TABLET BY MOUTH EVERY DAY (Patient taking differently: Take 5 mg by mouth daily. ) 90 tablet 1 08/31/2018 at Unknown time  . Ascorbic Acid (VITAMIN C) 500 MG CAPS Take 500 mg by mouth daily.   08/31/2018 at Unknown time  . aspirin EC 81 MG tablet Take 81 mg by mouth daily.    08/31/2018 at Unknown time  . cyclobenzaprine (FLEXERIL) 5 MG tablet Take 5 mg by mouth 3 (three) times daily.    08/31/2018 at Unknown time  . Ferrous Sulfate (IRON) 325 (65 Fe) MG TABS Take 1 tablet by mouth at bedtime.    08/31/2018 at Unknown time  . fluticasone (FLONASE) 50 MCG/ACT nasal spray Place 2 sprays into both nostrils daily.  6 08/31/2018 at Unknown time  . furosemide (LASIX) 20 MG tablet Take 1 tablet (20 mg total) by mouth daily. 10 tablet 0 08/31/2018 at Unknown time  . gabapentin (NEURONTIN) 300 MG capsule Take 1 capsule by mouth 3 (three) times daily.  2 08/31/2018 at Unknown time  . ipratropium-albuterol (DUONEB) 0.5-2.5 (3) MG/3ML SOLN Take 3 mLs by nebulization every 6 (six) hours as needed (for shortness of breath).   08/31/2018 at Unknown time  . lidocaine-prilocaine (EMLA) cream Apply 1 application topically as needed. Apply a small amount to port site and cover with plastic wrap 1 hour prior to infusion appointments 30 g 3 Past Week at Unknown time  . lisinopril (ZESTRIL) 20 MG tablet Take 1 tablet (  20 mg total) by mouth daily.   08/31/2018 at Unknown time  . metoCLOPramide (REGLAN) 5 MG tablet Take 1  tablet by mouth 4 (four) times daily -  before meals and at bedtime. Daily as needed  5 Past Month at Unknown time  . metoprolol succinate (TOPROL-XL) 25 MG 24 hr tablet Take 25 mg by mouth daily.   08/31/2018 at 0800  . Multiple Vitamins-Minerals (MULTIVITAMIN WITH MINERALS) tablet Take 1 tablet by mouth daily.   08/31/2018 at Unknown time  . MYRBETRIQ 50 MG TB24 tablet Take 1 tablet by mouth daily.   08/31/2018 at Unknown time  . nitroGLYCERIN (NITROSTAT) 0.4 MG SL tablet Place 0.4 mg under the tongue every 5 (five) minutes as needed for chest pain.   unknown  . Nivolumab (OPDIVO IV) Inject into the vein every 14 (fourteen) days.   Past Month at Unknown time  . Oxycodone HCl 10 MG TABS Take 1 tablet by mouth every 4 (four) hours.    08/31/2018 at Unknown time  . potassium gluconate (HM POTASSIUM) 595 (99 K) MG TABS tablet Take 595 mg by mouth daily.   08/31/2018 at Unknown time  . PRILOSEC 40 MG capsule Take 1 capsule by mouth daily.   08/31/2018 at Unknown time  . simvastatin (ZOCOR) 10 MG tablet Take 10 mg by mouth at bedtime.   3 08/31/2018 at Unknown time  . VENTOLIN HFA 108 (90 Base) MCG/ACT inhaler Inhale 2 puffs into the lungs every 4 (four) hours as needed for wheezing or shortness of breath. 18 g 5 Past Month at Unknown time  . Ipilimumab (YERVOY IV) Inject into the vein every 6 (six) weeks.   Unknown at Unknown time  . polyethylene glycol (MIRALAX / GLYCOLAX) 17 g packet Take 17 g by mouth daily as needed for mild constipation. 14 each 0 Unknown at Unknown time   Scheduled: . aspirin EC  81 mg Oral Daily  . Chlorhexidine Gluconate Cloth  6 each Topical Q0600  . enoxaparin (LOVENOX) injection  40 mg Subcutaneous Q24H  . ferrous sulfate  325 mg Oral Daily  . fluticasone  2 spray Each Nare Daily  . fluticasone furoate-vilanterol  1 puff Inhalation Daily  . gabapentin  300 mg Oral TID  . ipratropium  0.5 mg Nebulization Q6H  . levalbuterol  1.25 mg Nebulization Q6H  . methylPREDNISolone  (SOLU-MEDROL) injection  60 mg Intravenous Q6H  . metoCLOPramide  5 mg Oral TID AC & HS  . mirabegron ER  50 mg Oral Daily  . montelukast  10 mg Oral Daily  . multivitamin with minerals  1 tablet Oral Daily  . oxyCODONE  10 mg Oral Q4H  . pantoprazole  40 mg Oral Daily  . simvastatin  10 mg Oral QHS  . vitamin C  500 mg Oral Daily   Continuous: . sodium chloride Stopped (09/03/18 0532)  . ceFEPime (MAXIPIME) IV 2 g (09/05/18 0742)   WGN:FAOZHY chloride, albuterol, ipratropium-albuterol, LORazepam, morphine injection, polyethylene glycol  Assesment: He was admitted with healthcare associated pneumonia and acute on chronic hypoxic respiratory failure.  He is known to have malignant neoplasm of the left lung.  He was very short of breath very tachypneic and very agitated yesterday and that is much better.  He is now on high flow nasal cannula.  At baseline he has COPD which is pretty severe.  He has ongoing cigarette smoking and is requesting a cigarette.  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  Continue high flow nasal cannula.  BiPAP based on clinical grounds.    LOS: 4 days   Alonza Bogus 09/05/2018, 8:34 AM

## 2018-09-06 DIAGNOSIS — J9621 Acute and chronic respiratory failure with hypoxia: Secondary | ICD-10-CM

## 2018-09-06 DIAGNOSIS — C3492 Malignant neoplasm of unspecified part of left bronchus or lung: Secondary | ICD-10-CM

## 2018-09-06 DIAGNOSIS — J449 Chronic obstructive pulmonary disease, unspecified: Secondary | ICD-10-CM

## 2018-09-06 DIAGNOSIS — Z515 Encounter for palliative care: Secondary | ICD-10-CM

## 2018-09-06 NOTE — Progress Notes (Signed)
Patient took about half of neb.

## 2018-09-06 NOTE — Consult Note (Signed)
Consultation Note Date: 09/06/18  Patient Name: Bradley [name]  DOB: 03-18-1940  MRN: [id number]  Age / Sex: 78 y.o., male  PCP: Bradley Gravel, MD Referring Physician: Murlean Iba, MD  Reason for Consultation: Establishing goals of care  HPI/Patient Profile: 78 y.o. male  with past medical history of non-small cell lung cancer, COPD on home oxygen, CAD, HLD, anemia, HTN, smoker admitted on 08/31/2018 with shortness of breath and recurrent HCAP. Patient admitted for acute on chronic respiratory failure, initially showing improvement but required transfer to stepdown ICU for BiPAP/HFNC. Sepsis secondary to HCAP now resolved. Followed by Dr. Delton [name] for [place]. Palliative medicine consultation for goals of care.   Clinical Assessment and Goals of Care:  I have reviewed medical records, discussed with care team, and assessed the patient at bedside. Bradley [name] is awake, alert, oriented and able to participate in conversation. He denies current pain or shortness of breath. No family at bedside.   I introduced Palliative Medicine as specialized medical care for people living with serious illness. It focuses on providing relief from the symptoms and stress of a serious illness. The goal is to improve quality of life for both the patient and the family. Patient remembers PMT provider from previous admission.   Patient lives at home with 6 women including his daughter Bradley [name]) who is his primary caregiver and support system. Prior to admit, requiring 4L [place]. Appetite is great, 'anything I can get my hands on.' Functionally, he has become weaker.   Discussed course of hospitalization including diagnoses and interventions. Per RT at bedside, patient has not required BiPAP since 8/8. They are attempting to wean him on HFNC. Currently on 8L HFNC and sats are high 90's. Patient shares this has been the "worst" he has ever been.  Patient reports this is his 6th hospitalization this year for pneumonia. Explained chronic respiratory failure not only from multiple admissions for pneumonia but also with underlying COPD and lung cancer. Patient confirms his decision for DNR/DNI. (MOST form was completed in the past by this NP).  Patient is followed by Dr. Raliegh [name] at the cancer center and is currently receiving immunotherapy. Patient reports he had scheduled repeat CT today and cancer center f/u tomorrow, which he will unfortunately miss due to hospitalization. Patient speaks of his plan to f/u with Dr. Raliegh [name] when possible for repeat CT.   I attempted to elicit values and goals of care important to the patient. He states "home." We discussed hopes of becoming medically stable and ability to wean from high oxygen requirement but also that he is very weak and will need physical therapy. Patient willing to participate in physical therapy and seems amendable to short term rehab if necessary, with hopes to return back home following rehab.   Patient appears exhausted this afternoon and is planning to rest before dinner. He asks when the dinner tray will arrive. Reassured of ongoing palliative support this admission and also, that I will contact his daughter.    SUMMARY OF RECOMMENDATIONS    DNR/DNI  Continue current plan of care and medical management. Patient has not required BiPAP in 2 days.   Patient hopeful to work with physical therapy when possible. He understands he is very weak and may need short-term rehab. His ultimate goal is to return 'home.'  At this point, patient still plans to follow-up with outpatient oncology regarding options and repeat CT scan.   PMT will continue to follow clinical progression inpatient. No family at bedside this afternoon. PMT provider will attempt engagement with daughter as well.   Code Status/Advance Care Planning:  DNR  Symptom Management:   Per attending  Palliative Prophylaxis:    Aspiration, Delirium Protocol, Frequent Pain Assessment, Oral Care and Turn Reposition  Psycho-social/Spiritual:   Desire for further Chaplaincy support: yes  Additional Recommendations: Caregiving  Support/Resources, Compassionate Wean Education and Education on Hospice  Prognosis:   Poor long-term prognosis  Discharge Planning: To Be Determined      Primary Diagnoses: Present on Admission: . HCAP (healthcare-associated pneumonia) . Acute and chronic respiratory failure with hypoxia ([place]) . Tobacco abuse . COPD (chronic obstructive pulmonary disease) ([place]) . Essential hypertension . Abnormal chest x-ray . Mass of lower lobe of left lung . Non-small cell carcinoma of left lung ([place]) . Malignant neoplasm of left lung ([place])   I have reviewed the medical record, interviewed the patient and family, and examined the patient. The following aspects are pertinent.  Past Medical History:  Diagnosis Date  . Cancer ([place])    lung  . Chronic lower back pain   . Chronic pain    pain management  . Contact with stonefish as cause of accidental injury 1954   "stung across my left hand in [place]; LUE weaker since"  . COPD (chronic obstructive pulmonary disease) ([place])   . Coronary artery disease   . Dyspnea    with exertion  . High cholesterol   . History of blood transfusion 1943  . History of kidney stones    passed  . Hypertension   . Mass of left lung   . Migraine    "none since the 1990s" (09/11/2016)  . Myocardial infarction ([place]) 01/20/1993; 02/28/1993  . Neuromuscular disorder ([facility])    neuropathy left  . On home O2   . Pneumonia    "several times" (09/11/2016)  . Tobacco abuse    ongoing   Social History   Socioeconomic History  . Marital status: Widowed    Spouse name: Not on file  . Number of children: Not on file  . Years of education: Not on file  . Highest education level: Not on file  Occupational History  . Not on file  Social Needs  . Financial resource  strain: Patient refused  . Food insecurity    Worry: Patient refused    Inability: Patient refused  . Transportation needs    Medical: Patient refused    Non-medical: Patient refused  Tobacco Use  . Smoking status: Current Every Day Smoker    Packs/day: 1.00    Years: 70.00    Pack years: 70.00    Types: Cigarettes  . Smokeless tobacco: Never Used  . Tobacco comment: smokes 8 a day  Substance and Sexual Activity  . Alcohol use: No    Comment: 11/26/2017  "drank from the age of 8 til 03/05/1993"  . Drug use: Not Currently    Comment: 11/26/2017 "used some drugs when I was young"  . Sexual activity: Not Currently  Lifestyle  . Physical activity  Days per week: Patient refused    Minutes per session: Patient refused  . Stress: To some extent  Relationships  . Social Herbalist on phone: Patient refused    Gets together: Patient refused    Attends religious service: Patient refused    Active member of club or organization: Patient refused    Attends meetings of clubs or organizations: Patient refused    Relationship status: Patient refused  Other Topics Concern  . Not on file  Social History Narrative  . Not on file   Family History  Adopted: Yes  Problem Relation Age of Onset  . Cancer Mother   . Obesity Father    Scheduled Meds: . amLODipine  5 mg Oral Daily  . aspirin EC  81 mg Oral Daily  . Chlorhexidine Gluconate Cloth  6 each Topical Q0600  . enoxaparin (LOVENOX) injection  40 mg Subcutaneous Q24H  . ferrous sulfate  325 mg Oral Daily  . fluticasone  2 spray Each Nare Daily  . fluticasone furoate-vilanterol  1 puff Inhalation Daily  . gabapentin  300 mg Oral TID  . ipratropium  0.5 mg Nebulization Q6H  . levalbuterol  1.25 mg Nebulization Q6H  . methylPREDNISolone (SOLU-MEDROL) injection  60 mg Intravenous Q6H  . metoCLOPramide  5 mg Oral TID AC & HS  . mirabegron ER  50 mg Oral Daily  . montelukast  10 mg Oral Daily  . multivitamin with  minerals  1 tablet Oral Daily  . nicotine  14 mg Transdermal Daily  . oxyCODONE  10 mg Oral Q4H  . pantoprazole  40 mg Oral Daily  . simvastatin  10 mg Oral QHS  . vitamin C  500 mg Oral Daily   Continuous Infusions: . sodium chloride Stopped (09/03/18 0532)  . ceFEPime (MAXIPIME) IV 2 g (09/06/18 1643)  . fluconazole (DIFLUCAN) IV 200 mg (09/06/18 1032)   PRN Meds:.sodium chloride, albuterol, ipratropium-albuterol, LORazepam, morphine injection, polyethylene glycol Medications Prior to Admission:  Prior to Admission medications   Medication Sig Start Date End Date Taking? Authorizing Provider  ADVAIR HFA 115-21 MCG/ACT inhaler Inhale 2 puffs into the lungs 2 (two) times daily. 08/05/18  Yes Emokpae, Courage, MD  amLODipine (NORVASC) 5 MG tablet TAKE 1 TABLET BY MOUTH EVERY DAY Patient taking differently: Take 5 mg by mouth daily.  08/24/17  Yes Lendon Colonel, NP  Ascorbic Acid (VITAMIN C) 500 MG CAPS Take 500 mg by mouth daily.   Yes [provider]  aspirin EC 81 MG tablet Take 81 mg by mouth daily.    Yes [provider]  cyclobenzaprine (FLEXERIL) 5 MG tablet Take 5 mg by mouth 3 (three) times daily.    Yes [provider]  Ferrous Sulfate (IRON) 325 (65 Fe) MG TABS Take 1 tablet by mouth at bedtime.  12/10/15  Yes [provider]  fluticasone (FLONASE) 50 MCG/ACT nasal spray Place 2 sprays into both nostrils daily. 12/11/17  Yes [provider]  furosemide (LASIX) 20 MG tablet Take 1 tablet (20 mg total) by mouth daily. 08/05/18 08/05/19 Yes Emokpae, Courage, MD  gabapentin (NEURONTIN) 300 MG capsule Take 1 capsule by mouth 3 (three) times daily. 08/15/16  Yes [provider]  ipratropium-albuterol (DUONEB) 0.5-2.5 (3) MG/3ML SOLN Take 3 mLs by nebulization every 6 (six) hours as needed (for shortness of breath). 08/05/18  Yes Emokpae, Courage, MD  lidocaine-prilocaine (EMLA) cream Apply 1 application topically as needed. Apply a small  amount  to port site and cover with plastic wrap 1 hour prior to infusion appointments 04/23/18  Yes Derek Jack, MD  lisinopril (ZESTRIL) 20 MG tablet Take 1 tablet (20 mg total) by mouth daily. 06/18/18  Yes Aline August, MD  metoCLOPramide (REGLAN) 5 MG tablet Take 1 tablet by mouth 4 (four) times daily -  before meals and at bedtime. Daily as needed 01/25/16  Yes [provider]  metoprolol succinate (TOPROL-XL) 25 MG 24 hr tablet Take 25 mg by mouth daily.   Yes [provider]  Multiple Vitamins-Minerals (MULTIVITAMIN WITH MINERALS) tablet Take 1 tablet by mouth daily.   Yes [provider]  MYRBETRIQ 50 MG TB24 tablet Take 1 tablet by mouth daily. 06/12/18  Yes [provider]  nitroGLYCERIN (NITROSTAT) 0.4 MG SL tablet Place 0.4 mg under the tongue every 5 (five) minutes as needed for chest pain.   Yes [provider]  Nivolumab (OPDIVO IV) Inject into the vein every 14 (fourteen) days.   Yes [provider]  Oxycodone HCl 10 MG TABS Take 1 tablet by mouth every 4 (four) hours.    Yes [provider]  potassium gluconate (HM POTASSIUM) 595 (99 K) MG TABS tablet Take 595 mg by mouth daily.   Yes [provider]  PRILOSEC 40 MG capsule Take 1 capsule by mouth daily. 03/31/18  Yes [provider]  simvastatin (ZOCOR) 10 MG tablet Take 10 mg by mouth at bedtime.  12/23/15  Yes [provider]  VENTOLIN HFA 108 (90 Base) MCG/ACT inhaler Inhale 2 puffs into the lungs every 4 (four) hours as needed for wheezing or shortness of breath. 08/05/18 09/04/18 Yes Emokpae, Courage, MD  Ipilimumab (YERVOY IV) Inject into the vein every 6 (six) weeks.    [provider]  polyethylene glycol (MIRALAX / GLYCOLAX) 17 g packet Take 17 g by mouth daily as needed for mild constipation. 06/18/18   Aline August, MD   Allergies  Allergen Reactions  . Aspirin     Allergy to regular Aspirin only     Review of  Systems  Constitutional: Positive for activity change.  Respiratory: Positive for shortness of breath.   Neurological: Positive for weakness.   Physical Exam Vitals signs and nursing note reviewed.  Constitutional:      General: He is awake.     Appearance: He is ill-appearing.  HENT:     Head: Normocephalic and atraumatic.  Cardiovascular:     Rate and Rhythm: Normal rate.  Pulmonary:     Effort: No tachypnea, accessory muscle usage or respiratory distress.     Comments: 8L HFNC Abdominal:     Tenderness: There is no abdominal tenderness.  Skin:    General: Skin is warm and dry.  Neurological:     Mental Status: He is alert and oriented to person, place, and time.  Psychiatric:        Mood and Affect: Mood normal.        Speech: Speech normal.        Behavior: Behavior normal.        Cognition and Memory: Cognition normal.    Vital Signs: BP (!) 158/86   Pulse 90   Temp 97.6 F (36.4 C) (Oral)   Resp 12   Ht 6' (1.829 m)   Wt 65.8 kg   SpO2 95%   BMI 19.67 kg/m  Pain Scale: 0-10 POSS *See Group Information*: 2-Acceptable,Slightly drowsy, easily aroused Pain Score: 7    SpO2:  SpO2: 95 % O2 Device:SpO2: 95 % O2 Flow Rate: .O2 Flow Rate (L/min): 8 L/min  IO: Intake/output summary:   Intake/Output Summary (Last 24 hours) at 09/06/2018 1700 Last data filed at 09/06/2018 0947 Gross per 24 hour  Intake 109.61 ml  Output 650 ml  Net -540.39 ml    LBM: Last BM Date: 08/30/18 Baseline Weight: Weight: 68 kg Most recent weight: Weight: 65.8 kg     Palliative Assessment/Data: PPS 40%      Time In: 1510 Time Out: 1550 Time Total: 40 Greater than 50%  of this time was spent counseling and coordinating care related to the above assessment and plan.  Signed by:  Ihor Dow, FNP-C Palliative Medicine Team  Phone: [phone] Fax: [phone]   Please contact Palliative Medicine Team phone at [phone] for questions and concerns.  For individual provider:  See Shea Evans

## 2018-09-06 NOTE — Progress Notes (Signed)
PROGRESS NOTE    Bradley [name]  SNK:[id number]  DOB: 04-29-1940  DOA: 08/31/2018 PCP: Jani Gravel, MD   Brief Admission Hx: 78 y.o. male, with history of non-small cell lung cancer, COPD, CAD, HLD, anemia, and hypertension who presents to the ED today via EMS for shortness of breath and recurrent HCAP.   MDM/Assessment & Plan:   1. Acute on chronic hypoxic respiratory failure-he had initially started showing improvement but had taken a turn for the worse and was briefly placed on bipap and was transferred to the stepdown ICU and started on IV steroids.  Appreciate pulmonary consultation and assistance from Dr. Luan Pulling.  He is of bipap now and on HFNC.  He does not want to be intubated.  I confirmed with his daughter.  Morphine ordered for dyspnea.  I will ask for palliative care team to see 8/10.  2. Sepsis secondary to HCAP - Resolved now.  Continue supportive care.   3. HCAP - continue cefepime.  DC vanc as MRSA screen negative. Coag neg staph seen in 1/2 blood culture likely contaminant.   Pleurx cath that was removed 08/25/18 for a malignant pleural effusion that had resolved.   Sputum culture with abundant yeast, will start fluconazole.  4. Hyponatremia - resolved.  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  5. Non small cell lung cancer - he will need to follow up with oncologist after discharge. He was seen on 08/24/18. They are planning CT CAP in next week prior to appt next week.  6. S/p recent Pleuryx cath removal - sutures have been removed.  7. Essential hypertension - resume amlodipine 5 mg daily.   DVT prophylaxis: lovenox Code Status: DNR Family Communication: patient bedside update/daughter updated telephone Disposition Plan: daughter wants to pursue SNF if patient survives hospitalization, consulting palliative care for goals of care as condition seems to be progressing  Consultants:  Pulmonology  Procedures:    Antimicrobials:  Cefepime  Subjective: Pt is  lethargic after pain meds had been given earlier  Objective: Vitals:   09/06/18 0400 09/06/18 0500 09/06/18 0600 09/06/18 0700  BP: (!) 143/74  (!) 146/77 (!) 146/85  Pulse: 78 97 81 76  Resp: 12  12 (!) 21  Temp: (!) 97.5 F (36.4 C)     TempSrc: Oral     SpO2: 93% 98% 98% 98%  Weight:  65.8 kg    Height:        Intake/Output Summary (Last 24 hours) at 09/06/2018 0755 Last data filed at 09/06/2018 0700 Gross per 24 hour  Intake 859.93 ml  Output 650 ml  Net 209.93 ml   Filed Weights   09/01/18 2005 09/05/18 0500 09/06/18 0500  Weight: 67.7 kg 66.8 kg 65.8 kg    REVIEW OF SYSTEMS  Unable to obtain due to condition  Exam:  General exam: elderly male on bipap, appears chronically ill.  Pt lethargic but arousable.  Respiratory system: diffuse rales poor air movement.  Cardiovascular system: S1 & S2 heard. No JVD, murmurs, gallops, clicks or pedal edema. Gastrointestinal system: Abdomen is nondistended, soft and nontender. Normal bowel sounds heard. Central nervous system: lethargic but arousable. No focal neurological deficits. Extremities: no CCE.  Data Reviewed: Basic Metabolic Panel: Recent Labs  Lab 08/31/18 2203 09/02/18 0917 09/03/18 0512 09/04/18 0442  NA 133* 133* 131* 136  K 3.7 3.7 3.7 3.9  CL 102 102 99 102  CO2 21* 23 23 24   GLUCOSE 126* 100* 101* 133*  BUN 19 14 15  26*  CREATININE 1.24 0.83 0.85 0.79  CALCIUM 8.1* 8.3* 8.5* 8.7*   Liver Function Tests: Recent Labs  Lab 08/31/18 2203 09/02/18 0917 09/03/18 0512  AST 14* 15 14*  ALT 10 9 10   ALKPHOS 31* 23* 23*  BILITOT 0.7 1.3* 1.0  PROT 8.3* 8.1 7.8  ALBUMIN 2.6* 2.5* 2.4*   No results for input(s): LIPASE, AMYLASE in the last 168 hours. No results for input(s): AMMONIA in the last 168 hours. CBC: Recent Labs  Lab 08/31/18 2203 09/02/18 0917 09/03/18 0512 09/04/18 0442  WBC 16.7* 12.0* 15.0* 10.9*  NEUTROABS 12.9*  --   --   --   HGB 13.6 12.9* 13.0 12.4*  HCT 44.3 41.4 41.3  39.0  MCV 91.3 91.4 90.0 88.8  PLT 590* 554* 529* 525*   Cardiac Enzymes: No results for input(s): CKTOTAL, CKMB, CKMBINDEX, TROPONINI in the last 168 hours. CBG (last 3)  No results for input(s): GLUCAP in the last 72 hours. Recent Results (from the past 240 hour(s))  Urine culture     Status: None   Collection Time: 08/31/18  9:45 PM   Specimen: In/Out Cath Urine  Result Value Ref Range Status   Specimen Description   Final    IN/OUT CATH URINE Performed at [facility], [address], [place], [place] [address]    Special Requests   Final    NONE Performed at [facility], [address], [place], [place] [address]    Culture   Final    NO GROWTH Performed at [facility], [place] [address] [place],  [address]    Report Status 09/02/2018 FINAL  Final  SARS Coronavirus 2 [facility] order, Performed in [facility] hospital lab) Nasopharyngeal Nasopharyngeal Swab     Status: None   Collection Time: 08/31/18  9:46 PM   Specimen: Nasopharyngeal Swab  Result Value Ref Range Status   SARS Coronavirus 2 NEGATIVE NEGATIVE Final    Comment: (NOTE) If result is NEGATIVE SARS-CoV-2 target nucleic acids are NOT DETECTED. The SARS-CoV-2 RNA is generally detectable in upper and lower  respiratory specimens during the acute phase of infection. The lowest  concentration of SARS-CoV-2 viral copies this assay can detect is 250  copies / mL. A negative result does not preclude SARS-CoV-2 infection  and should not be used as the sole basis for treatment or other  patient management decisions.  A negative result may occur with  improper specimen collection / handling, submission of specimen other  than nasopharyngeal swab, presence of viral mutation(s) within the  areas targeted by this assay, and inadequate number of viral copies  (<250 copies / mL). A negative result must be combined with clinical  observations, patient history, and epidemiological information. If result is  POSITIVE SARS-CoV-2 target nucleic acids are DETECTED. The SARS-CoV-2 RNA is generally detectable in upper and lower  respiratory specimens dur ing the acute phase of infection.  Positive  results are indicative of active infection with SARS-CoV-2.  Clinical  correlation with patient history and other diagnostic information is  necessary to determine patient infection status.  Positive results do  not rule out bacterial infection or co-infection with other viruses. If result is PRESUMPTIVE POSTIVE SARS-CoV-2 nucleic acids MAY BE PRESENT.   A presumptive positive result was obtained on the submitted specimen  and confirmed on repeat testing.  While 2019 novel coronavirus  (SARS-CoV-2) nucleic acids may be present in the submitted sample  additional confirmatory testing may be necessary for epidemiological  and / or clinical management purposes  to differentiate between  SARS-CoV-2 and other Sarbecovirus currently known to infect humans.  If clinically indicated additional testing with an alternate test  methodology [phone]) is advised. The SARS-CoV-2 RNA is generally  detectable in upper and lower respiratory sp ecimens during the acute  phase of infection. The expected result is Negative. Fact Sheet for Patients:  StrictlyIdeas.no Fact Sheet for Healthcare Providers: BankingDealers.co.za This test is not yet approved or cleared by the [place] FDA and has been authorized for detection and/or diagnosis of SARS-CoV-2 by FDA under an Emergency Use Authorization (EUA).  This EUA will remain in effect (meaning this test can be used) for the duration of the COVID-19 declaration under Section 564(b)(1) of the Act, 21 U.S.C. section 360bbb-3(b)(1), unless the authorization is terminated or revoked sooner. Performed at [facility], [address] [place], [place] [address]   Blood Culture (routine x 2)     Status: Abnormal   Collection  Time: 08/31/18 10:03 PM   Specimen: BLOOD RIGHT HAND  Result Value Ref Range Status   Specimen Description   Final    BLOOD RIGHT HAND Performed at [facility], [address], [place], [place] [address]    Special Requests   Final    BOTTLES DRAWN AEROBIC AND ANAEROBIC Blood Culture adequate volume Performed at [facility], [address] [place], [place] [address]    Culture  Setup Time   Final    GRAM POSITIVE COCCI IN BOTH AEROBIC AND ANAEROBIC BOTTLES Gram Stain Report Called to,Read Back By and Verified With: MINTER,R ON 09/01/18 AT 43 BY LOY,C PERFORMED AT [facility] CRITICAL RESULT CALLED TO, READ BACK BY AND VERIFIED WITH: C HOPKINS RN 09/01/18 2220 JDW    Culture (A)  Final    STAPHYLOCOCCUS SPECIES (COAGULASE NEGATIVE) THE SIGNIFICANCE OF ISOLATING THIS ORGANISM FROM A SINGLE SET OF BLOOD CULTURES WHEN MULTIPLE SETS ARE DRAWN IS UNCERTAIN. PLEASE NOTIFY THE MICROBIOLOGY DEPARTMENT WITHIN ONE WEEK IF SPECIATION AND SENSITIVITIES ARE REQUIRED. Performed at [facility], [place] [address], [place], [place] [address]    Report Status 09/03/2018 FINAL  Final  Blood Culture ID Panel (Reflexed)     Status: Abnormal   Collection Time: 08/31/18 10:03 PM  Result Value Ref Range Status   Enterococcus species NOT DETECTED NOT DETECTED Final   Listeria monocytogenes NOT DETECTED NOT DETECTED Final   Staphylococcus species DETECTED (A) NOT DETECTED Final    Comment: Methicillin (oxacillin) resistant coagulase negative staphylococcus. Possible blood culture contaminant (unless isolated from more than one blood culture draw or clinical case suggests pathogenicity). No antibiotic treatment is indicated for blood  culture contaminants. CRITICAL RESULT CALLED TO, READ BACK BY AND VERIFIED WITH: C HOPKINS RN 09/01/18 2220 JDW    Staphylococcus aureus (BCID) NOT DETECTED NOT DETECTED Final   Methicillin resistance DETECTED (A) NOT DETECTED Final    Comment: CRITICAL RESULT CALLED TO, READ BACK BY  AND VERIFIED WITH: C HOPKINS RN 09/01/18 2220 JDW    Streptococcus species NOT DETECTED NOT DETECTED Final   Streptococcus agalactiae NOT DETECTED NOT DETECTED Final   Streptococcus pneumoniae NOT DETECTED NOT DETECTED Final   Streptococcus pyogenes NOT DETECTED NOT DETECTED Final   Acinetobacter baumannii NOT DETECTED NOT DETECTED Final   Enterobacteriaceae species NOT DETECTED NOT DETECTED Final   Enterobacter cloacae complex NOT DETECTED NOT DETECTED Final   Escherichia coli NOT DETECTED NOT DETECTED Final   Klebsiella oxytoca NOT DETECTED NOT DETECTED Final  Klebsiella pneumoniae NOT DETECTED NOT DETECTED Final   Proteus species NOT DETECTED NOT DETECTED Final   Serratia marcescens NOT DETECTED NOT DETECTED Final   Haemophilus influenzae NOT DETECTED NOT DETECTED Final   Neisseria meningitidis NOT DETECTED NOT DETECTED Final   Pseudomonas aeruginosa NOT DETECTED NOT DETECTED Final   Candida albicans NOT DETECTED NOT DETECTED Final   Candida glabrata NOT DETECTED NOT DETECTED Final   Candida krusei NOT DETECTED NOT DETECTED Final   Candida parapsilosis NOT DETECTED NOT DETECTED Final   Candida tropicalis NOT DETECTED NOT DETECTED Final    Comment: Performed at [facility], [place] [address], [place], [place] [address]  Blood Culture (routine x 2)     Status: None   Collection Time: 08/31/18 10:04 PM   Specimen: BLOOD RIGHT ARM  Result Value Ref Range Status   Specimen Description BLOOD RIGHT ARM  Final   Special Requests   Final    BOTTLES DRAWN AEROBIC AND ANAEROBIC Blood Culture adequate volume   Culture   Final    NO GROWTH 5 DAYS Performed at [facility], [address], [place], [place] [address]    Report Status 09/05/2018 FINAL  Final  Culture, respiratory     Status: None   Collection Time: 09/01/18 11:27 AM  Result Value Ref Range Status   Specimen Description   Final    EXPECTORATED SPUTUM Performed at [facility], [address] [place], [place] [address]     Special Requests   Final    NONE Performed at [facility], [address] [place], [place] [address]    Gram Stain   Final    FEW WBC PRESENT,BOTH PMN AND MONONUCLEAR FEW GRAM POSITIVE COCCI FEW GRAM VARIABLE ROD ABUNDANT YEAST ABUNDANT SQUAMOUS EPITHELIAL CELLS PRESENT    Culture   Final    Consistent with normal respiratory flora. Performed at [facility], [place] [address] [place], [place] [address]    Report Status 09/03/2018 FINAL  Final  MRSA PCR Screening     Status: None   Collection Time: 09/01/18  8:39 PM   Specimen: Nasal Mucosa; Nasopharyngeal  Result Value Ref Range Status   MRSA by PCR NEGATIVE NEGATIVE Final    Comment:        The GeneXpert MRSA Assay (FDA approved for NASAL specimens only), is one component of a comprehensive MRSA colonization surveillance program. It is not intended to diagnose MRSA infection nor to guide or monitor treatment for MRSA infections. Performed at [facility], [address], [place], [place] [address]      Studies: No results found. Scheduled Meds: . amLODipine  5 mg Oral Daily  . aspirin EC  81 mg Oral Daily  . Chlorhexidine Gluconate Cloth  6 each Topical Q0600  . enoxaparin (LOVENOX) injection  40 mg Subcutaneous Q24H  . ferrous sulfate  325 mg Oral Daily  . fluticasone  2 spray Each Nare Daily  . fluticasone furoate-vilanterol  1 puff Inhalation Daily  . gabapentin  300 mg Oral TID  . ipratropium  0.5 mg Nebulization Q6H  . levalbuterol  1.25 mg Nebulization Q6H  . methylPREDNISolone (SOLU-MEDROL) injection  60 mg Intravenous Q6H  . metoCLOPramide  5 mg Oral TID AC & HS  . mirabegron ER  50 mg Oral Daily  . montelukast  10 mg Oral Daily  . multivitamin with minerals  1 tablet Oral Daily  . nicotine  14 mg Transdermal Daily  . oxyCODONE  10  mg Oral Q4H  . pantoprazole  40 mg Oral Daily  . simvastatin  10 mg Oral QHS  . vitamin C  500 mg Oral Daily   Continuous Infusions: . sodium chloride Stopped  (09/03/18 0532)  . ceFEPime (MAXIPIME) IV Stopped (09/05/18 2355)  . fluconazole (DIFLUCAN) IV 200 mg (09/05/18 1438)    Active Problems:   Tobacco abuse   COPD (chronic obstructive pulmonary disease) (HCC)   Essential hypertension   Abnormal chest x-ray   Mass of lower lobe of left lung   Non-small cell carcinoma of left lung (HCC)   Malignant neoplasm of left lung (HCC)   HCAP (healthcare-associated pneumonia)   Acute and chronic respiratory failure with hypoxia [facility])   Critical Care Time spent: 30 minutes   Irwin Brakeman, MD [facility] 09/06/2018, 7:55 AM    LOS: 5 days  How to contact the [facility] Attending or Consulting provider [place] or covering provider during after hours [place], for this patient?  1. Check the care team in [facility] and look for a) attending/consulting [facility] provider listed and b) the [facility] team listed 2. Log into [email or web address] and use [place]'s universal password to access. If you do not have the password, please contact the hospital operator. 3. Locate the [facility] provider you are looking for under [facility] and page to a number that you can be directly reached. 4. If you still have difficulty reaching the provider, please page the [facility] (Director on Call) for the Hospitalists listed on amion for assistance.

## 2018-09-06 NOTE — Care Management Important Message (Signed)
Important Message  Patient Details  Name: Bradley [name] MRN: [id number] Date of Birth: Sep 18, 1940   Medicare Important Message Given:  Yes     Tommy Medal 09/06/2018, 4:17 PM

## 2018-09-06 NOTE — Progress Notes (Signed)
Subjective: He had trouble with confusion last night.  I think he is a little better this morning.  He is still on high flow nasal cannula  Objective: Vital signs in last 24 hours: Temp:  [97.5 F (36.4 C)-98.8 F (37.1 C)] 97.5 F (36.4 C) (08/10 0400) Pulse Rate:  [74-100] 76 (08/10 0700) Resp:  [11-27] 21 (08/10 0700) BP: (121-159)/(66-90) 146/85 (08/10 0700) SpO2:  [93 %-100 %] 98 % (08/10 0700) Weight:  [65.8 kg] 65.8 kg (08/10 0500) Weight change: -1 kg Last BM Date: 08/30/18  Intake/Output from previous day: 08/09 0701 - 08/10 0700 In: 1219.9 [P.O.:720; IV Piggyback:499.9] Out: 650 [Urine:650]  PHYSICAL EXAM General appearance: He is awake and confused. Resp: He still has significant rales bilaterally Cardio: regular rate and rhythm, S1, S2 normal, no murmur, click, rub or gallop GI: soft, non-tender; bowel sounds normal; no masses,  no organomegaly Extremities: extremities normal, atraumatic, no cyanosis or edema  Lab Results:  No results found for this or any previous visit (from the past 48 hour(s)).  ABGS No results for input(s): PHART, PO2ART, TCO2, HCO3 in the last 72 hours.  Invalid input(s): PCO2 CULTURES Recent Results (from the past 240 hour(s))  Urine culture     Status: None   Collection Time: 08/31/18  9:45 PM   Specimen: In/Out Cath Urine  Result Value Ref Range Status   Specimen Description   Final    IN/OUT CATH URINE Performed at [facility], [address], [place], [place] [address]    Special Requests   Final    NONE Performed at [facility], [address] [place], [place] [address]    Culture   Final    NO GROWTH Performed at [facility], [place] [address] [place], [place] [address]    Report Status 09/02/2018 FINAL  Final  SARS Coronavirus 2 [facility] order, Performed in [facility] hospital lab) Nasopharyngeal Nasopharyngeal Swab     Status: None   Collection Time: 08/31/18  9:46 PM   Specimen: Nasopharyngeal Swab  Result  Value Ref Range Status   SARS Coronavirus 2 NEGATIVE NEGATIVE Final    Comment: (NOTE) If result is NEGATIVE SARS-CoV-2 target nucleic acids are NOT DETECTED. The SARS-CoV-2 RNA is generally detectable in upper and lower  respiratory specimens during the acute phase of infection. The lowest  concentration of SARS-CoV-2 viral copies this assay can detect is 250  copies / mL. A negative result does not preclude SARS-CoV-2 infection  and should not be used as the sole basis for treatment or other  patient management decisions.  A negative result may occur with  improper specimen collection / handling, submission of specimen other  than nasopharyngeal swab, presence of viral mutation(s) within the  areas targeted by this assay, and inadequate number of viral copies  (<250 copies / mL). A negative result must be combined with clinical  observations, patient history, and epidemiological information. If result is POSITIVE SARS-CoV-2 target nucleic acids are DETECTED. The SARS-CoV-2 RNA is generally detectable in upper and lower  respiratory specimens dur ing the acute phase of infection.  Positive  results are indicative of active infection with SARS-CoV-2.  Clinical  correlation with patient history and other diagnostic information is  necessary to determine patient infection status.  Positive results do  not rule out bacterial infection or co-infection with other viruses. If result is PRESUMPTIVE POSTIVE SARS-CoV-2 nucleic acids MAY BE PRESENT.   A presumptive positive result was obtained on the submitted  specimen  and confirmed on repeat testing.  While 2019 novel coronavirus  (SARS-CoV-2) nucleic acids may be present in the submitted sample  additional confirmatory testing may be necessary for epidemiological  and / or clinical management purposes  to differentiate between  SARS-CoV-2 and other Sarbecovirus currently known to infect humans.  If clinically indicated additional testing  with an alternate test  methodology [phone]) is advised. The SARS-CoV-2 RNA is generally  detectable in upper and lower respiratory sp ecimens during the acute  phase of infection. The expected result is Negative. Fact Sheet for Patients:  StrictlyIdeas.no Fact Sheet for Healthcare Providers: BankingDealers.co.za This test is not yet approved or cleared by the [place] FDA and has been authorized for detection and/or diagnosis of SARS-CoV-2 by FDA under an Emergency Use Authorization (EUA).  This EUA will remain in effect (meaning this test can be used) for the duration of the COVID-19 declaration under Section 564(b)(1) of the Act, 21 U.S.C. section 360bbb-3(b)(1), unless the authorization is terminated or revoked sooner. Performed at [facility], [address], [place], [place] [address]   Blood Culture (routine x 2)     Status: Abnormal   Collection Time: 08/31/18 10:03 PM   Specimen: BLOOD RIGHT HAND  Result Value Ref Range Status   Specimen Description   Final    BLOOD RIGHT HAND Performed at [facility], [address], [place], [place] [address]    Special Requests   Final    BOTTLES DRAWN AEROBIC AND ANAEROBIC Blood Culture adequate volume Performed at [facility], [address], [place], [place] [address]    Culture  Setup Time   Final    GRAM POSITIVE COCCI IN BOTH AEROBIC AND ANAEROBIC BOTTLES Gram Stain Report Called to,Read Back By and Verified With: MINTER,R ON 09/01/18 AT 44 BY LOY,C PERFORMED AT [facility] CRITICAL RESULT CALLED TO, READ BACK BY AND VERIFIED WITH: C HOPKINS RN 09/01/18 2220 JDW    Culture (A)  Final    STAPHYLOCOCCUS SPECIES (COAGULASE NEGATIVE) THE SIGNIFICANCE OF ISOLATING THIS ORGANISM FROM A SINGLE SET OF BLOOD CULTURES WHEN MULTIPLE SETS ARE DRAWN IS UNCERTAIN. PLEASE NOTIFY THE MICROBIOLOGY DEPARTMENT WITHIN ONE WEEK IF SPECIATION AND SENSITIVITIES ARE REQUIRED. Performed at [place], [place] [address], [place], [place] [address]    Report Status 09/03/2018 FINAL  Final  Blood Culture ID Panel (Reflexed)     Status: Abnormal   Collection Time: 08/31/18 10:03 PM  Result Value Ref Range Status   Enterococcus species NOT DETECTED NOT DETECTED Final   Listeria monocytogenes NOT DETECTED NOT DETECTED Final   Staphylococcus species DETECTED (A) NOT DETECTED Final    Comment: Methicillin (oxacillin) resistant coagulase negative staphylococcus. Possible blood culture contaminant (unless isolated from more than one blood culture draw or clinical case suggests pathogenicity). No antibiotic treatment is indicated for blood  culture contaminants. CRITICAL RESULT CALLED TO, READ BACK BY AND VERIFIED WITH: C HOPKINS RN 09/01/18 2220 JDW    Staphylococcus aureus (BCID) NOT DETECTED NOT DETECTED Final   Methicillin resistance DETECTED (A) NOT DETECTED Final    Comment: CRITICAL RESULT CALLED TO, READ BACK BY AND VERIFIED WITH: C HOPKINS RN 09/01/18 2220 JDW    Streptococcus species NOT DETECTED NOT DETECTED Final   Streptococcus agalactiae NOT DETECTED NOT DETECTED Final   Streptococcus pneumoniae NOT DETECTED NOT DETECTED Final   Streptococcus pyogenes NOT DETECTED NOT DETECTED Final   Acinetobacter baumannii NOT DETECTED NOT DETECTED Final   Enterobacteriaceae species NOT  DETECTED NOT DETECTED Final   Enterobacter cloacae complex NOT DETECTED NOT DETECTED Final   Escherichia coli NOT DETECTED NOT DETECTED Final   Klebsiella oxytoca NOT DETECTED NOT DETECTED Final   Klebsiella pneumoniae NOT DETECTED NOT DETECTED Final   Proteus species NOT DETECTED NOT DETECTED Final   Serratia marcescens NOT DETECTED NOT DETECTED Final   Haemophilus influenzae NOT DETECTED NOT DETECTED Final   Neisseria meningitidis NOT DETECTED NOT DETECTED Final   Pseudomonas aeruginosa NOT DETECTED NOT DETECTED Final   Candida albicans NOT DETECTED NOT DETECTED Final   Candida glabrata NOT DETECTED NOT DETECTED  Final   Candida krusei NOT DETECTED NOT DETECTED Final   Candida parapsilosis NOT DETECTED NOT DETECTED Final   Candida tropicalis NOT DETECTED NOT DETECTED Final    Comment: Performed at [facility], [place] [address], [place], [place] [address]  Blood Culture (routine x 2)     Status: None   Collection Time: 08/31/18 10:04 PM   Specimen: BLOOD RIGHT ARM  Result Value Ref Range Status   Specimen Description BLOOD RIGHT ARM  Final   Special Requests   Final    BOTTLES DRAWN AEROBIC AND ANAEROBIC Blood Culture adequate volume   Culture   Final    NO GROWTH 5 DAYS Performed at [facility], [address] [place], [place] [address]    Report Status 09/05/2018 FINAL  Final  Culture, respiratory     Status: None   Collection Time: 09/01/18 11:27 AM  Result Value Ref Range Status   Specimen Description   Final    EXPECTORATED SPUTUM Performed at [facility], [address], [place], [place] [address]    Special Requests   Final    NONE Performed at [facility], [address], [place], [place] [address]    Gram Stain   Final    FEW WBC PRESENT,BOTH PMN AND MONONUCLEAR FEW GRAM POSITIVE COCCI FEW GRAM VARIABLE ROD ABUNDANT YEAST ABUNDANT SQUAMOUS EPITHELIAL CELLS PRESENT    Culture   Final    Consistent with normal respiratory flora. Performed at [facility], [place] [address], [place], [place] [address]    Report Status 09/03/2018 FINAL  Final  MRSA PCR Screening     Status: None   Collection Time: 09/01/18  8:39 PM   Specimen: Nasal Mucosa; Nasopharyngeal  Result Value Ref Range Status   MRSA by PCR NEGATIVE NEGATIVE Final    Comment:        The GeneXpert MRSA Assay (FDA approved for NASAL specimens only), is one component of a comprehensive MRSA colonization surveillance program. It is not intended to diagnose MRSA infection nor to guide or monitor treatment for MRSA infections. Performed at [facility], [address], [place], [place] [address]     Studies/Results: No results found.  Medications:  Prior to Admission:  Medications Prior to Admission  Medication Sig Dispense Refill Last Dose  . ADVAIR HFA 115-21 MCG/ACT inhaler Inhale 2 puffs into the lungs 2 (two) times daily. 1 Inhaler 4 08/31/2018 at Unknown time  . amLODipine (NORVASC) 5 MG tablet TAKE 1 TABLET BY MOUTH EVERY DAY (Patient taking differently: Take 5 mg by mouth daily. ) 90 tablet 1 08/31/2018 at Unknown time  . Ascorbic Acid (VITAMIN C) 500 MG CAPS Take 500 mg by mouth daily.   08/31/2018 at Unknown time  . aspirin EC 81 MG tablet Take 81 mg by mouth daily.    08/31/2018 at Unknown time  . cyclobenzaprine (  FLEXERIL) 5 MG tablet Take 5 mg by mouth 3 (three) times daily.    08/31/2018 at Unknown time  . Ferrous Sulfate (IRON) 325 (65 Fe) MG TABS Take 1 tablet by mouth at bedtime.    08/31/2018 at Unknown time  . fluticasone (FLONASE) 50 MCG/ACT nasal spray Place 2 sprays into both nostrils daily.  6 08/31/2018 at Unknown time  . furosemide (LASIX) 20 MG tablet Take 1 tablet (20 mg total) by mouth daily. 10 tablet 0 08/31/2018 at Unknown time  . gabapentin (NEURONTIN) 300 MG capsule Take 1 capsule by mouth 3 (three) times daily.  2 08/31/2018 at Unknown time  . ipratropium-albuterol (DUONEB) 0.5-2.5 (3) MG/3ML SOLN Take 3 mLs by nebulization every 6 (six) hours as needed (for shortness of breath).   08/31/2018 at Unknown time  . lidocaine-prilocaine (EMLA) cream Apply 1 application topically as needed. Apply a small amount to port site and cover with plastic wrap 1 hour prior to infusion appointments 30 g 3 Past Week at Unknown time  . lisinopril (ZESTRIL) 20 MG tablet Take 1 tablet (20 mg total) by mouth daily.   08/31/2018 at Unknown time  . metoCLOPramide (REGLAN) 5 MG tablet Take 1 tablet by mouth 4 (four) times daily -  before meals and at bedtime. Daily as needed  5 Past Month at Unknown time  . metoprolol succinate (TOPROL-XL) 25 MG 24 hr tablet Take 25 mg by mouth daily.   08/31/2018 at  0800  . Multiple Vitamins-Minerals (MULTIVITAMIN WITH MINERALS) tablet Take 1 tablet by mouth daily.   08/31/2018 at Unknown time  . MYRBETRIQ 50 MG TB24 tablet Take 1 tablet by mouth daily.   08/31/2018 at Unknown time  . nitroGLYCERIN (NITROSTAT) 0.4 MG SL tablet Place 0.4 mg under the tongue every 5 (five) minutes as needed for chest pain.   unknown  . Nivolumab (OPDIVO IV) Inject into the vein every 14 (fourteen) days.   Past Month at Unknown time  . Oxycodone HCl 10 MG TABS Take 1 tablet by mouth every 4 (four) hours.    08/31/2018 at Unknown time  . potassium gluconate (HM POTASSIUM) 595 (99 K) MG TABS tablet Take 595 mg by mouth daily.   08/31/2018 at Unknown time  . PRILOSEC 40 MG capsule Take 1 capsule by mouth daily.   08/31/2018 at Unknown time  . simvastatin (ZOCOR) 10 MG tablet Take 10 mg by mouth at bedtime.   3 08/31/2018 at Unknown time  . VENTOLIN HFA 108 (90 Base) MCG/ACT inhaler Inhale 2 puffs into the lungs every 4 (four) hours as needed for wheezing or shortness of breath. 18 g 5 Past Month at Unknown time  . Ipilimumab (YERVOY IV) Inject into the vein every 6 (six) weeks.   Unknown at Unknown time  . polyethylene glycol (MIRALAX / GLYCOLAX) 17 g packet Take 17 g by mouth daily as needed for mild constipation. 14 each 0 Unknown at Unknown time   Scheduled: . amLODipine  5 mg Oral Daily  . aspirin EC  81 mg Oral Daily  . Chlorhexidine Gluconate Cloth  6 each Topical Q0600  . enoxaparin (LOVENOX) injection  40 mg Subcutaneous Q24H  . ferrous sulfate  325 mg Oral Daily  . fluticasone  2 spray Each Nare Daily  . fluticasone furoate-vilanterol  1 puff Inhalation Daily  . gabapentin  300 mg Oral TID  . ipratropium  0.5 mg Nebulization Q6H  . levalbuterol  1.25 mg Nebulization Q6H  . methylPREDNISolone (SOLU-MEDROL)  injection  60 mg Intravenous Q6H  . metoCLOPramide  5 mg Oral TID AC & HS  . mirabegron ER  50 mg Oral Daily  . montelukast  10 mg Oral Daily  . multivitamin with minerals   1 tablet Oral Daily  . nicotine  14 mg Transdermal Daily  . oxyCODONE  10 mg Oral Q4H  . pantoprazole  40 mg Oral Daily  . simvastatin  10 mg Oral QHS  . vitamin C  500 mg Oral Daily   Continuous: . sodium chloride Stopped (09/03/18 0532)  . ceFEPime (MAXIPIME) IV Stopped (09/05/18 2355)  . fluconazole (DIFLUCAN) IV 200 mg (09/05/18 1438)   AOZ:HYQMVH chloride, albuterol, ipratropium-albuterol, LORazepam, morphine injection, polyethylene glycol  Assesment: He was admitted with increasing shortness of breath.  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  He was thought to have pneumonia on admission and that is been treated.  He initially did okay then he developed markedly increased dyspnea and hypoxia and was transferred to stepdown and has been on phone on BiPAP since.  He is mostly on high flow nasal cannula now.  I do not think we can tell for sure about pneumonia because of the extensive fibrotic changes on his chest x-ray he is better with his oxygenation and with his air hunger  He is having trouble with confusion possibly from being out of his normal environment  He does not want to be resuscitated because of his cancer.  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  Continue other treatments.  Dr. Wynetta Emery plans to get palliative care consultation which I think is appropriate    LOS: 5 days   Bradley [name] 09/06/2018, 7:53 AM

## 2018-09-07 LAB — COMPREHENSIVE METABOLIC PANEL
ALT: 26 U/L (ref 0–44)
AST: 25 U/L (ref 15–41)
Albumin: 2.6 g/dL — ABNORMAL LOW (ref 3.5–5.0)
Alkaline Phosphatase: 18 U/L — ABNORMAL LOW (ref 38–126)
Anion gap: 8 (ref 5–15)
BUN: 26 mg/dL — ABNORMAL HIGH (ref 8–23)
CO2: 32 mmol/L (ref 22–32)
Calcium: 8.7 mg/dL — ABNORMAL LOW (ref 8.9–10.3)
Chloride: 99 mmol/L (ref 98–111)
Creatinine, Ser: 0.77 mg/dL (ref 0.61–1.24)
GFR calc Af Amer: >60 mL/min (ref >60)
GFR calc non Af Amer: >60 mL/min (ref >60)
Glucose, Bld: 147 mg/dL — ABNORMAL HIGH (ref 70–99)
Potassium: 4 mmol/L (ref 3.5–5.1)
Sodium: 139 mmol/L (ref 135–145)
Total Bilirubin: 0.7 mg/dL (ref 0.3–1.2)
Total Protein: 8.1 g/dL (ref 6.5–8.1)

## 2018-09-07 LAB — CBC WITH DIFFERENTIAL/PLATELET
Abs Immature Granulocytes: 0.06 10*3/uL (ref 0.00–0.07)
Basophils Absolute: 0 10*3/uL (ref 0.0–0.1)
Basophils Relative: 0 %
Eosinophils Absolute: 0 10*3/uL (ref 0.0–0.5)
Eosinophils Relative: 0 %
HCT: 41.8 % (ref 39.0–52.0)
Hemoglobin: 13 g/dL (ref 13.0–17.0)
Immature Granulocytes: 1 %
Lymphocytes Relative: 10 %
Lymphs Abs: 1.3 10*3/uL (ref 0.7–4.0)
MCH: 28.2 pg (ref 26.0–34.0)
MCHC: 31.1 g/dL (ref 30.0–36.0)
MCV: 90.7 fL (ref 80.0–100.0)
Monocytes Absolute: 0.7 10*3/uL (ref 0.1–1.0)
Monocytes Relative: 5 %
Neutro Abs: 11 10*3/uL — ABNORMAL HIGH (ref 1.7–7.7)
Neutrophils Relative %: 84 %
Platelets: 541 10*3/uL — ABNORMAL HIGH (ref 150–400)
RBC: 4.61 MIL/uL (ref 4.22–5.81)
RDW: 16.4 % — ABNORMAL HIGH (ref 11.5–15.5)
WBC: 13.1 10*3/uL — ABNORMAL HIGH (ref 4.0–10.5)
nRBC: 0 % (ref 0.0–0.2)

## 2018-09-07 LAB — MAGNESIUM: Magnesium: 2.1 mg/dL (ref 1.7–2.4)

## 2018-09-07 MED ORDER — SODIUM CHLORIDE 0.9 % IV SOLN
2.0000 g | Freq: Three times a day (TID) | INTRAVENOUS | Status: AC
  Administered 2018-09-07 (×2): 2 g via INTRAVENOUS
  Filled 2018-09-07 (×2): qty 2

## 2018-09-07 MED ORDER — METOPROLOL SUCCINATE ER 25 MG PO TB24
25.0000 mg | ORAL_TABLET | Freq: Every day | ORAL | Status: DC
  Administered 2018-09-07 – 2018-09-09 (×3): 25 mg via ORAL
  Filled 2018-09-07 (×3): qty 1

## 2018-09-07 MED ORDER — LISINOPRIL 10 MG PO TABS
10.0000 mg | ORAL_TABLET | Freq: Every day | ORAL | Status: DC
  Administered 2018-09-07 – 2018-09-09 (×2): 10 mg via ORAL
  Filled 2018-09-07 (×3): qty 1

## 2018-09-07 NOTE — Progress Notes (Signed)
PROGRESS NOTE    Bradley [name]  OJJ:[id number]  DOB: May 28, 1940  DOA: 08/31/2018 PCP: Jani Gravel, MD   Brief Admission Hx: 78 y.o. male, with history of non-small cell lung cancer, COPD, CAD, HLD, anemia, and hypertension who presents to the ED today via EMS for shortness of breath and recurrent HCAP.   MDM/Assessment & Plan:   1. Acute on chronic hypoxic respiratory failure-he had initially started showing improvement but had taken a turn for the worse and was briefly placed on bipap and was transferred to the stepdown ICU and started on IV steroids.  Appreciate pulmonary consultation and assistance from Dr. Luan Pulling.  He is of bipap now and on HFNC.  He does not want to be intubated.  I confirmed with his daughter.  Morphine ordered for dyspnea.  I will ask for palliative care team to see 8/10.  2. Sepsis secondary to HCAP - Resolved now.  Continue supportive care.   3. HCAP - continue cefepime.  DC vanc as MRSA screen negative. Coag neg staph seen in 1/2 blood culture likely contaminant.   Pleurx cath that was removed 08/25/18 for a malignant pleural effusion that had resolved.   Sputum culture with abundant yeast, will start fluconazole.  4. Hyponatremia - resolved.  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  5. Non small cell lung cancer - he will need to follow up with oncologist after discharge. He was seen on 08/24/18. They are planning CT CAP in next week prior to next appt.   6. S/p recent Pleuryx cath removal - sutures have been removed.  7. Essential hypertension - resumed amlodipine 5 mg daily, metoprolol and lisinopril.   DVT prophylaxis: lovenox Code Status: DNR Family Communication: patient bedside update/daughter updated telephone Disposition Plan: daughter wants to pursue SNF if patient survives hospitalization, consulting palliative care for goals of care as condition seems to be progressing  Consultants:  Pulmonology  Procedures:    Antimicrobials:  Cefepime   Subjective: Pt reports cough and chest congestion.   Objective: Vitals:   09/07/18 0400 09/07/18 0404 09/07/18 0500 09/07/18 0600  BP: (!) 152/87  (!) 161/86 (!) 163/88  Pulse: 89  81 92  Resp: 12  (!) 6 20  Temp:  97.9 F (36.6 C)    TempSrc:  Oral    SpO2: 97%  98% 94%  Weight:  66.4 kg    Height:        Intake/Output Summary (Last 24 hours) at 09/07/2018 0708 Last data filed at 09/07/2018 9371 Gross per 24 hour  Intake 400 ml  Output 1650 ml  Net -1250 ml   Filed Weights   09/05/18 0500 09/06/18 0500 09/07/18 0404  Weight: 66.8 kg 65.8 kg 66.4 kg    REVIEW OF SYSTEMS  Unable to obtain due to condition  Exam:  General exam: elderly male on bipap, appears chronically ill.  Pt lethargic but arousable.  Respiratory system: diffuse rales poor air movement but slightly improved from prior exam.  Cardiovascular system: S1 & S2 heard. No JVD, murmurs, gallops, clicks or pedal edema. Gastrointestinal system: Abdomen is nondistended, soft and nontender. Normal bowel sounds heard. Central nervous system: lethargic but arousable. No focal neurological deficits. Extremities: no CCE.  Data Reviewed: Basic Metabolic Panel: Recent Labs  Lab 08/31/18 2203 09/02/18 0917 09/03/18 0512 09/04/18 0442 09/07/18 0341  NA 133* 133* 131* 136 139  K 3.7 3.7 3.7 3.9 4.0  CL 102 102 99 102 99  CO2 21* 23 23 24  32  GLUCOSE  126* 100* 101* 133* 147*  BUN 19 14 15  26* 26*  CREATININE 1.24 0.83 0.85 0.79 0.77  CALCIUM 8.1* 8.3* 8.5* 8.7* 8.7*  MG  --   --   --   --  2.1   Liver Function Tests: Recent Labs  Lab 08/31/18 2203 09/02/18 0917 09/03/18 0512 09/07/18 0341  AST 14* 15 14* 25  ALT 10 9 10 26   ALKPHOS 31* 23* 23* 18*  BILITOT 0.7 1.3* 1.0 0.7  PROT 8.3* 8.1 7.8 8.1  ALBUMIN 2.6* 2.5* 2.4* 2.6*   No results for input(s): LIPASE, AMYLASE in the last 168 hours. No results for input(s): AMMONIA in the last 168 hours. CBC: Recent Labs  Lab 08/31/18 2203 09/02/18  0917 09/03/18 0512 09/04/18 0442 09/07/18 0341  WBC 16.7* 12.0* 15.0* 10.9* 13.1*  NEUTROABS 12.9*  --   --   --  11.0*  HGB 13.6 12.9* 13.0 12.4* 13.0  HCT 44.3 41.4 41.3 39.0 41.8  MCV 91.3 91.4 90.0 88.8 90.7  PLT 590* 554* 529* 525* 541*   Cardiac Enzymes: No results for input(s): CKTOTAL, CKMB, CKMBINDEX, TROPONINI in the last 168 hours. CBG (last 3)  No results for input(s): GLUCAP in the last 72 hours. Recent Results (from the past 240 hour(s))  Urine culture     Status: None   Collection Time: 08/31/18  9:45 PM   Specimen: In/Out Cath Urine  Result Value Ref Range Status   Specimen Description   Final    IN/OUT CATH URINE Performed at [facility], [address], [place], [place] [address]    Special Requests   Final    NONE Performed at [facility], [address] [place], [place] [address]    Culture   Final    NO GROWTH Performed at [facility], [place] [address], [place], [place] [address]    Report Status 09/02/2018 FINAL  Final  SARS Coronavirus 2 [facility] order, Performed in [facility] hospital lab) Nasopharyngeal Nasopharyngeal Swab     Status: None   Collection Time: 08/31/18  9:46 PM   Specimen: Nasopharyngeal Swab  Result Value Ref Range Status   SARS Coronavirus 2 NEGATIVE NEGATIVE Final    Comment: (NOTE) If result is NEGATIVE SARS-CoV-2 target nucleic acids are NOT DETECTED. The SARS-CoV-2 RNA is generally detectable in upper and lower  respiratory specimens during the acute phase of infection. The lowest  concentration of SARS-CoV-2 viral copies this assay can detect is 250  copies / mL. A negative result does not preclude SARS-CoV-2 infection  and should not be used as the sole basis for treatment or other  patient management decisions.  A negative result may occur with  improper specimen collection / handling, submission of specimen other  than nasopharyngeal swab, presence of viral mutation(s) within the  areas targeted by this assay,  and inadequate number of viral copies  (<250 copies / mL). A negative result must be combined with clinical  observations, patient history, and epidemiological information. If result is POSITIVE SARS-CoV-2 target nucleic acids are DETECTED. The SARS-CoV-2 RNA is generally detectable in upper and lower  respiratory specimens dur ing the acute phase of infection.  Positive  results are indicative of active infection with SARS-CoV-2.  Clinical  correlation with patient history and other diagnostic information is  necessary to determine patient infection status.  Positive results do  not rule out bacterial infection or co-infection with other viruses. If result is PRESUMPTIVE POSTIVE SARS-CoV-2 nucleic acids MAY  BE PRESENT.   A presumptive positive result was obtained on the submitted specimen  and confirmed on repeat testing.  While 2019 novel coronavirus  (SARS-CoV-2) nucleic acids may be present in the submitted sample  additional confirmatory testing may be necessary for epidemiological  and / or clinical management purposes  to differentiate between  SARS-CoV-2 and other Sarbecovirus currently known to infect humans.  If clinically indicated additional testing with an alternate test  methodology [phone]) is advised. The SARS-CoV-2 RNA is generally  detectable in upper and lower respiratory sp ecimens during the acute  phase of infection. The expected result is Negative. Fact Sheet for Patients:  StrictlyIdeas.no Fact Sheet for Healthcare Providers: BankingDealers.co.za This test is not yet approved or cleared by the [place] FDA and has been authorized for detection and/or diagnosis of SARS-CoV-2 by FDA under an Emergency Use Authorization (EUA).  This EUA will remain in effect (meaning this test can be used) for the duration of the COVID-19 declaration under Section 564(b)(1) of the Act, 21 U.S.C. section 360bbb-3(b)(1), unless  the authorization is terminated or revoked sooner. Performed at [facility], [address] [place], [place] [address]   Blood Culture (routine x 2)     Status: Abnormal   Collection Time: 08/31/18 10:03 PM   Specimen: BLOOD RIGHT HAND  Result Value Ref Range Status   Specimen Description   Final    BLOOD RIGHT HAND Performed at [facility], [address], [place], [place] [address]    Special Requests   Final    BOTTLES DRAWN AEROBIC AND ANAEROBIC Blood Culture adequate volume Performed at [facility], [address], [place], [place] [address]    Culture  Setup Time   Final    GRAM POSITIVE COCCI IN BOTH AEROBIC AND ANAEROBIC BOTTLES Gram Stain Report Called to,Read Back By and Verified With: MINTER,R ON 09/01/18 AT 73 BY LOY,C PERFORMED AT [facility] CRITICAL RESULT CALLED TO, READ BACK BY AND VERIFIED WITH: C HOPKINS RN 09/01/18 2220 JDW    Culture (A)  Final    STAPHYLOCOCCUS SPECIES (COAGULASE NEGATIVE) THE SIGNIFICANCE OF ISOLATING THIS ORGANISM FROM A SINGLE SET OF BLOOD CULTURES WHEN MULTIPLE SETS ARE DRAWN IS UNCERTAIN. PLEASE NOTIFY THE MICROBIOLOGY DEPARTMENT WITHIN ONE WEEK IF SPECIATION AND SENSITIVITIES ARE REQUIRED. Performed at [facility], [place] [address] [place], [place] [address]    Report Status 09/03/2018 FINAL  Final  Blood Culture ID Panel (Reflexed)     Status: Abnormal   Collection Time: 08/31/18 10:03 PM  Result Value Ref Range Status   Enterococcus species NOT DETECTED NOT DETECTED Final   Listeria monocytogenes NOT DETECTED NOT DETECTED Final   Staphylococcus species DETECTED (A) NOT DETECTED Final    Comment: Methicillin (oxacillin) resistant coagulase negative staphylococcus. Possible blood culture contaminant (unless isolated from more than one blood culture draw or clinical case suggests pathogenicity). No antibiotic treatment is indicated for blood  culture contaminants. CRITICAL RESULT CALLED TO, READ BACK BY AND VERIFIED WITH: C HOPKINS RN  09/01/18 2220 JDW    Staphylococcus aureus (BCID) NOT DETECTED NOT DETECTED Final   Methicillin resistance DETECTED (A) NOT DETECTED Final    Comment: CRITICAL RESULT CALLED TO, READ BACK BY AND VERIFIED WITH: C HOPKINS RN 09/01/18 2220 JDW    Streptococcus species NOT DETECTED NOT DETECTED Final   Streptococcus agalactiae NOT DETECTED NOT DETECTED Final   Streptococcus pneumoniae NOT DETECTED NOT DETECTED Final   Streptococcus pyogenes NOT DETECTED NOT DETECTED Final  Acinetobacter baumannii NOT DETECTED NOT DETECTED Final   Enterobacteriaceae species NOT DETECTED NOT DETECTED Final   Enterobacter cloacae complex NOT DETECTED NOT DETECTED Final   Escherichia coli NOT DETECTED NOT DETECTED Final   Klebsiella oxytoca NOT DETECTED NOT DETECTED Final   Klebsiella pneumoniae NOT DETECTED NOT DETECTED Final   Proteus species NOT DETECTED NOT DETECTED Final   Serratia marcescens NOT DETECTED NOT DETECTED Final   Haemophilus influenzae NOT DETECTED NOT DETECTED Final   Neisseria meningitidis NOT DETECTED NOT DETECTED Final   Pseudomonas aeruginosa NOT DETECTED NOT DETECTED Final   Candida albicans NOT DETECTED NOT DETECTED Final   Candida glabrata NOT DETECTED NOT DETECTED Final   Candida krusei NOT DETECTED NOT DETECTED Final   Candida parapsilosis NOT DETECTED NOT DETECTED Final   Candida tropicalis NOT DETECTED NOT DETECTED Final    Comment: Performed at [facility], [place] [address], [place], [place] [address]  Blood Culture (routine x 2)     Status: None   Collection Time: 08/31/18 10:04 PM   Specimen: BLOOD RIGHT ARM  Result Value Ref Range Status   Specimen Description BLOOD RIGHT ARM  Final   Special Requests   Final    BOTTLES DRAWN AEROBIC AND ANAEROBIC Blood Culture adequate volume   Culture   Final    NO GROWTH 5 DAYS Performed at [facility], [address], [place], [place] [address]    Report Status 09/05/2018 FINAL  Final  Culture, respiratory     Status: None    Collection Time: 09/01/18 11:27 AM  Result Value Ref Range Status   Specimen Description   Final    EXPECTORATED SPUTUM Performed at [facility], [address] [place], [place] [address]    Special Requests   Final    NONE Performed at [facility], [address], [place], [place] [address]    Gram Stain   Final    FEW WBC PRESENT,BOTH PMN AND MONONUCLEAR FEW GRAM POSITIVE COCCI FEW GRAM VARIABLE ROD ABUNDANT YEAST ABUNDANT SQUAMOUS EPITHELIAL CELLS PRESENT    Culture   Final    Consistent with normal respiratory flora. Performed at [facility], [place] [address], [place], [place] [address]    Report Status 09/03/2018 FINAL  Final  MRSA PCR Screening     Status: None   Collection Time: 09/01/18  8:39 PM   Specimen: Nasal Mucosa; Nasopharyngeal  Result Value Ref Range Status   MRSA by PCR NEGATIVE NEGATIVE Final    Comment:        The GeneXpert MRSA Assay (FDA approved for NASAL specimens only), is one component of a comprehensive MRSA colonization surveillance program. It is not intended to diagnose MRSA infection nor to guide or monitor treatment for MRSA infections. Performed at [facility], [address] [place], [place] [address]      Studies: No results found. Scheduled Meds: . amLODipine  5 mg Oral Daily  . aspirin EC  81 mg Oral Daily  . Chlorhexidine Gluconate Cloth  6 each Topical Q0600  . enoxaparin (LOVENOX) injection  40 mg Subcutaneous Q24H  . ferrous sulfate  325 mg Oral Daily  . fluticasone  2 spray Each Nare Daily  . fluticasone furoate-vilanterol  1 puff Inhalation Daily  . gabapentin  300 mg Oral TID  . ipratropium  0.5 mg Nebulization Q6H  . levalbuterol  1.25 mg Nebulization Q6H  . lisinopril  10 mg Oral Daily  . methylPREDNISolone (SOLU-MEDROL) injection  60  mg Intravenous Q6H  . metoCLOPramide  5 mg Oral TID AC & HS  . metoprolol succinate  25 mg Oral Daily  . mirabegron ER  50 mg Oral Daily  . montelukast  10 mg Oral Daily  .  multivitamin with minerals  1 tablet Oral Daily  . nicotine  14 mg Transdermal Daily  . oxyCODONE  10 mg Oral Q4H  . pantoprazole  40 mg Oral Daily  . simvastatin  10 mg Oral QHS  . vitamin C  500 mg Oral Daily   Continuous Infusions: . sodium chloride Stopped (09/03/18 0532)  . ceFEPime (MAXIPIME) IV Stopped (09/07/18 0107)  . fluconazole (DIFLUCAN) IV Stopped (09/06/18 1134)    Active Problems:   Tobacco abuse   COPD (chronic obstructive pulmonary disease) (HCC)   Essential hypertension   Abnormal chest x-ray   Mass of lower lobe of left lung   Non-small cell carcinoma of left lung (HCC)   Malignant neoplasm of left lung (HCC)   HCAP (healthcare-associated pneumonia)   Acute and chronic respiratory failure with hypoxia [facility])  Critical Care Time spent: 32 minutes   Irwin Brakeman, MD [facility] 09/07/2018, 7:08 AM    LOS: 6 days  How to contact the [facility] Attending or Consulting provider [place] or covering provider during after hours [place], for this patient?  1. Check the care team in [facility] and look for a) attending/consulting [facility] provider listed and b) the [facility] team listed 2. Log into [email or web address] and use [place]'s universal password to access. If you do not have the password, please contact the hospital operator. 3. Locate the [facility] provider you are looking for under [facility] and page to a number that you can be directly reached. 4. If you still have difficulty reaching the provider, please page the [facility] (Director on Call) for the Hospitalists listed on amion for assistance.

## 2018-09-07 NOTE — Progress Notes (Signed)
Subjective: He says he is still short of breath but generally feels a little bit better.  He is coughing nonproductively.  He is currently on high flow nasal cannula and is receiving morphine for dyspnea.  Objective: Vital signs in last 24 hours: Temp:  [97.3 F (36.3 C)-98.2 F (36.8 C)] 97.7 F (36.5 C) (08/11 0756) Pulse Rate:  [75-101] 98 (08/11 0756) Resp:  [6-20] 13 (08/11 0756) BP: (112-167)/(77-101) 146/84 (08/11 0700) SpO2:  [94 %-99 %] 96 % (08/11 0756) Weight:  [66.4 kg] 66.4 kg (08/11 0404) Weight change: 0.6 kg Last BM Date: 08/30/18  Intake/Output from previous day: 08/10 0701 - 08/11 0700 In: 400 [IV Piggyback:400] Out: 1650 [Urine:1650]  PHYSICAL EXAM General appearance: alert, cooperative and mild distress Resp: rales bilaterally and rhonchi bilaterally Cardio: regular rate and rhythm, S1, S2 normal, no murmur, click, rub or gallop GI: soft, non-tender; bowel sounds normal; no masses,  no organomegaly Extremities: extremities normal, atraumatic, no cyanosis or edema  Lab Results:  Results for orders placed or performed during the hospital encounter of 08/31/18 (from the past 48 hour(s))  CBC with Differential/Platelet     Status: Abnormal   Collection Time: 09/07/18  3:41 AM  Result Value Ref Range   WBC 13.1 (H) 4.0 - 10.5 K/uL   RBC 4.61 4.22 - 5.81 MIL/uL   Hemoglobin 13.0 13.0 - 17.0 g/dL   HCT 41.8 39.0 - 52.0 %   MCV 90.7 80.0 - 100.0 fL   MCH 28.2 26.0 - 34.0 pg   MCHC 31.1 30.0 - 36.0 g/dL   RDW 16.4 (H) 11.5 - 15.5 %   Platelets 541 (H) 150 - 400 K/uL   nRBC 0.0 0.0 - 0.2 %   Neutrophils Relative % 84 %   Neutro Abs 11.0 (H) 1.7 - 7.7 K/uL   Lymphocytes Relative 10 %   Lymphs Abs 1.3 0.7 - 4.0 K/uL   Monocytes Relative 5 %   Monocytes Absolute 0.7 0.1 - 1.0 K/uL   Eosinophils Relative 0 %   Eosinophils Absolute 0.0 0.0 - 0.5 K/uL   Basophils Relative 0 %   Basophils Absolute 0.0 0.0 - 0.1 K/uL   Immature Granulocytes 1 %   Abs Immature  Granulocytes 0.06 0.00 - 0.07 K/uL    Comment: Performed at [facility], [address], [place], [place] [address]  Comprehensive metabolic panel     Status: Abnormal   Collection Time: 09/07/18  3:41 AM  Result Value Ref Range   Sodium 139 135 - 145 mmol/L   Potassium 4.0 3.5 - 5.1 mmol/L   Chloride 99 98 - 111 mmol/L   CO2 32 22 - 32 mmol/L   Glucose, Bld 147 (H) 70 - 99 mg/dL   BUN 26 (H) 8 - 23 mg/dL   Creatinine, Ser 0.77 0.61 - 1.24 mg/dL   Calcium 8.7 (L) 8.9 - 10.3 mg/dL   Total Protein 8.1 6.5 - 8.1 g/dL   Albumin 2.6 (L) 3.5 - 5.0 g/dL   AST 25 15 - 41 U/L   ALT 26 0 - 44 U/L   Alkaline Phosphatase 18 (L) 38 - 126 U/L   Total Bilirubin 0.7 0.3 - 1.2 mg/dL   GFR calc non Af Amer >60 >60 mL/min   GFR calc Af Amer >60 >60 mL/min   Anion gap 8 5 - 15    Comment: Performed at [facility], [address] [place], [place] [address]  Magnesium     Status: None  Collection Time: 09/07/18  3:41 AM  Result Value Ref Range   Magnesium 2.1 1.7 - 2.4 mg/dL    Comment: Performed at [facility], [address] [place], [place] [address]    ABGS No results for input(s): PHART, PO2ART, TCO2, HCO3 in the last 72 hours.  Invalid input(s): PCO2 CULTURES Recent Results (from the past 240 hour(s))  Urine culture     Status: None   Collection Time: 08/31/18  9:45 PM   Specimen: In/Out Cath Urine  Result Value Ref Range Status   Specimen Description   Final    IN/OUT CATH URINE Performed at [facility], [address], [place], [place] [address]    Special Requests   Final    NONE Performed at [facility], [address] [place], [place] [address]    Culture   Final    NO GROWTH Performed at [facility], [place] [address] [place], [place] [address]    Report Status 09/02/2018 FINAL  Final  SARS Coronavirus 2 [facility] order, Performed in [facility] hospital lab) Nasopharyngeal Nasopharyngeal Swab     Status: None   Collection Time: 08/31/18  9:46 PM   Specimen:  Nasopharyngeal Swab  Result Value Ref Range Status   SARS Coronavirus 2 NEGATIVE NEGATIVE Final    Comment: (NOTE) If result is NEGATIVE SARS-CoV-2 target nucleic acids are NOT DETECTED. The SARS-CoV-2 RNA is generally detectable in upper and lower  respiratory specimens during the acute phase of infection. The lowest  concentration of SARS-CoV-2 viral copies this assay can detect is 250  copies / mL. A negative result does not preclude SARS-CoV-2 infection  and should not be used as the sole basis for treatment or other  patient management decisions.  A negative result may occur with  improper specimen collection / handling, submission of specimen other  than nasopharyngeal swab, presence of viral mutation(s) within the  areas targeted by this assay, and inadequate number of viral copies  (<250 copies / mL). A negative result must be combined with clinical  observations, patient history, and epidemiological information. If result is POSITIVE SARS-CoV-2 target nucleic acids are DETECTED. The SARS-CoV-2 RNA is generally detectable in upper and lower  respiratory specimens dur ing the acute phase of infection.  Positive  results are indicative of active infection with SARS-CoV-2.  Clinical  correlation with patient history and other diagnostic information is  necessary to determine patient infection status.  Positive results do  not rule out bacterial infection or co-infection with other viruses. If result is PRESUMPTIVE POSTIVE SARS-CoV-2 nucleic acids MAY BE PRESENT.   A presumptive positive result was obtained on the submitted specimen  and confirmed on repeat testing.  While 2019 novel coronavirus  (SARS-CoV-2) nucleic acids may be present in the submitted sample  additional confirmatory testing may be necessary for epidemiological  and / or clinical management purposes  to differentiate between  SARS-CoV-2 and other Sarbecovirus currently known to infect humans.  If clinically  indicated additional testing with an alternate test  methodology [phone]) is advised. The SARS-CoV-2 RNA is generally  detectable in upper and lower respiratory sp ecimens during the acute  phase of infection. The expected result is Negative. Fact Sheet for Patients:  StrictlyIdeas.no Fact Sheet for Healthcare Providers: BankingDealers.co.za This test is not yet approved or cleared by the [place] FDA and has been authorized for detection and/or diagnosis of SARS-CoV-2 by FDA under an Emergency Use Authorization (EUA).  This EUA will remain  in effect (meaning this test can be used) for the duration of the COVID-19 declaration under Section 564(b)(1) of the Act, 21 U.S.C. section 360bbb-3(b)(1), unless the authorization is terminated or revoked sooner. Performed at [facility], [address], [place], [place] [address]   Blood Culture (routine x 2)     Status: Abnormal   Collection Time: 08/31/18 10:03 PM   Specimen: BLOOD RIGHT HAND  Result Value Ref Range Status   Specimen Description   Final    BLOOD RIGHT HAND Performed at [facility], [address] [place], [place] [address]    Special Requests   Final    BOTTLES DRAWN AEROBIC AND ANAEROBIC Blood Culture adequate volume Performed at [facility], [address], [place], [place] [address]    Culture  Setup Time   Final    GRAM POSITIVE COCCI IN BOTH AEROBIC AND ANAEROBIC BOTTLES Gram Stain Report Called to,Read Back By and Verified With: MINTER,R ON 09/01/18 AT 80 BY LOY,C PERFORMED AT [facility] CRITICAL RESULT CALLED TO, READ BACK BY AND VERIFIED WITH: C HOPKINS RN 09/01/18 2220 JDW    Culture (A)  Final    STAPHYLOCOCCUS SPECIES (COAGULASE NEGATIVE) THE SIGNIFICANCE OF ISOLATING THIS ORGANISM FROM A SINGLE SET OF BLOOD CULTURES WHEN MULTIPLE SETS ARE DRAWN IS UNCERTAIN. PLEASE NOTIFY THE MICROBIOLOGY DEPARTMENT WITHIN ONE WEEK IF SPECIATION AND SENSITIVITIES ARE  REQUIRED. Performed at [facility], [place] [address] [place], [place] [address]    Report Status 09/03/2018 FINAL  Final  Blood Culture ID Panel (Reflexed)     Status: Abnormal   Collection Time: 08/31/18 10:03 PM  Result Value Ref Range Status   Enterococcus species NOT DETECTED NOT DETECTED Final   Listeria monocytogenes NOT DETECTED NOT DETECTED Final   Staphylococcus species DETECTED (A) NOT DETECTED Final    Comment: Methicillin (oxacillin) resistant coagulase negative staphylococcus. Possible blood culture contaminant (unless isolated from more than one blood culture draw or clinical case suggests pathogenicity). No antibiotic treatment is indicated for blood  culture contaminants. CRITICAL RESULT CALLED TO, READ BACK BY AND VERIFIED WITH: C HOPKINS RN 09/01/18 2220 JDW    Staphylococcus aureus (BCID) NOT DETECTED NOT DETECTED Final   Methicillin resistance DETECTED (A) NOT DETECTED Final    Comment: CRITICAL RESULT CALLED TO, READ BACK BY AND VERIFIED WITH: C HOPKINS RN 09/01/18 2220 JDW    Streptococcus species NOT DETECTED NOT DETECTED Final   Streptococcus agalactiae NOT DETECTED NOT DETECTED Final   Streptococcus pneumoniae NOT DETECTED NOT DETECTED Final   Streptococcus pyogenes NOT DETECTED NOT DETECTED Final   Acinetobacter baumannii NOT DETECTED NOT DETECTED Final   Enterobacteriaceae species NOT DETECTED NOT DETECTED Final   Enterobacter cloacae complex NOT DETECTED NOT DETECTED Final   Escherichia coli NOT DETECTED NOT DETECTED Final   Klebsiella oxytoca NOT DETECTED NOT DETECTED Final   Klebsiella pneumoniae NOT DETECTED NOT DETECTED Final   Proteus species NOT DETECTED NOT DETECTED Final   Serratia marcescens NOT DETECTED NOT DETECTED Final   Haemophilus influenzae NOT DETECTED NOT DETECTED Final   Neisseria meningitidis NOT DETECTED NOT DETECTED Final   Pseudomonas aeruginosa NOT DETECTED NOT DETECTED Final   Candida albicans NOT DETECTED NOT DETECTED Final    Candida glabrata NOT DETECTED NOT DETECTED Final   Candida krusei NOT DETECTED NOT DETECTED Final   Candida parapsilosis NOT DETECTED NOT DETECTED Final   Candida tropicalis NOT DETECTED NOT DETECTED Final    Comment: Performed at [facility], [address] [place], [place] [address]  Blood Culture (routine x 2)     Status: None   Collection Time: 08/31/18 10:04 PM   Specimen: BLOOD RIGHT ARM  Result Value Ref Range Status   Specimen Description BLOOD RIGHT ARM  Final   Special Requests   Final    BOTTLES DRAWN AEROBIC AND ANAEROBIC Blood Culture adequate volume   Culture   Final    NO GROWTH 5 DAYS Performed at [facility], [address], [place], [place] [address]    Report Status 09/05/2018 FINAL  Final  Culture, respiratory     Status: None   Collection Time: 09/01/18 11:27 AM  Result Value Ref Range Status   Specimen Description   Final    EXPECTORATED SPUTUM Performed at [facility], [address], [place], [place] [address]    Special Requests   Final    NONE Performed at [facility], [address], [place], [place] [address]    Gram Stain   Final    FEW WBC PRESENT,BOTH PMN AND MONONUCLEAR FEW GRAM POSITIVE COCCI FEW GRAM VARIABLE ROD ABUNDANT YEAST ABUNDANT SQUAMOUS EPITHELIAL CELLS PRESENT    Culture   Final    Consistent with normal respiratory flora. Performed at [facility], [place] [address] [place], [place] [address]    Report Status 09/03/2018 FINAL  Final  MRSA PCR Screening     Status: None   Collection Time: 09/01/18  8:39 PM   Specimen: Nasal Mucosa; Nasopharyngeal  Result Value Ref Range Status   MRSA by PCR NEGATIVE NEGATIVE Final    Comment:        The GeneXpert MRSA Assay (FDA approved for NASAL specimens only), is one component of a comprehensive MRSA colonization surveillance program. It is not intended to diagnose MRSA infection nor to guide or monitor treatment for MRSA infections. Performed at [facility],  [address] [place], [place] [address]    Studies/Results: No results found.  Medications:  Prior to Admission:  Medications Prior to Admission  Medication Sig Dispense Refill Last Dose  . ADVAIR HFA 115-21 MCG/ACT inhaler Inhale 2 puffs into the lungs 2 (two) times daily. 1 Inhaler 4 08/31/2018 at Unknown time  . amLODipine (NORVASC) 5 MG tablet TAKE 1 TABLET BY MOUTH EVERY DAY (Patient taking differently: Take 5 mg by mouth daily. ) 90 tablet 1 08/31/2018 at Unknown time  . Ascorbic Acid (VITAMIN C) 500 MG CAPS Take 500 mg by mouth daily.   08/31/2018 at Unknown time  . aspirin EC 81 MG tablet Take 81 mg by mouth daily.    08/31/2018 at Unknown time  . cyclobenzaprine (FLEXERIL) 5 MG tablet Take 5 mg by mouth 3 (three) times daily.    08/31/2018 at Unknown time  . Ferrous Sulfate (IRON) 325 (65 Fe) MG TABS Take 1 tablet by mouth at bedtime.    08/31/2018 at Unknown time  . fluticasone (FLONASE) 50 MCG/ACT nasal spray Place 2 sprays into both nostrils daily.  6 08/31/2018 at Unknown time  . furosemide (LASIX) 20 MG tablet Take 1 tablet (20 mg total) by mouth daily. 10 tablet 0 08/31/2018 at Unknown time  . gabapentin (NEURONTIN) 300 MG capsule Take 1 capsule by mouth 3 (three) times daily.  2 08/31/2018 at Unknown time  . ipratropium-albuterol (DUONEB) 0.5-2.5 (3) MG/3ML SOLN Take 3 mLs by nebulization every 6 (six) hours as needed (for shortness of breath).   08/31/2018 at Unknown time  . lidocaine-prilocaine (EMLA)  cream Apply 1 application topically as needed. Apply a small amount to port site and cover with plastic wrap 1 hour prior to infusion appointments 30 g 3 Past Week at Unknown time  . lisinopril (ZESTRIL) 20 MG tablet Take 1 tablet (20 mg total) by mouth daily.   08/31/2018 at Unknown time  . metoCLOPramide (REGLAN) 5 MG tablet Take 1 tablet by mouth 4 (four) times daily -  before meals and at bedtime. Daily as needed  5 Past Month at Unknown time  . metoprolol succinate (TOPROL-XL) 25 MG 24 hr tablet Take  25 mg by mouth daily.   08/31/2018 at 0800  . Multiple Vitamins-Minerals (MULTIVITAMIN WITH MINERALS) tablet Take 1 tablet by mouth daily.   08/31/2018 at Unknown time  . MYRBETRIQ 50 MG TB24 tablet Take 1 tablet by mouth daily.   08/31/2018 at Unknown time  . nitroGLYCERIN (NITROSTAT) 0.4 MG SL tablet Place 0.4 mg under the tongue every 5 (five) minutes as needed for chest pain.   unknown  . Nivolumab (OPDIVO IV) Inject into the vein every 14 (fourteen) days.   Past Month at Unknown time  . Oxycodone HCl 10 MG TABS Take 1 tablet by mouth every 4 (four) hours.    08/31/2018 at Unknown time  . potassium gluconate (HM POTASSIUM) 595 (99 K) MG TABS tablet Take 595 mg by mouth daily.   08/31/2018 at Unknown time  . PRILOSEC 40 MG capsule Take 1 capsule by mouth daily.   08/31/2018 at Unknown time  . simvastatin (ZOCOR) 10 MG tablet Take 10 mg by mouth at bedtime.   3 08/31/2018 at Unknown time  . VENTOLIN HFA 108 (90 Base) MCG/ACT inhaler Inhale 2 puffs into the lungs every 4 (four) hours as needed for wheezing or shortness of breath. 18 g 5 Past Month at Unknown time  . Ipilimumab (YERVOY IV) Inject into the vein every 6 (six) weeks.   Unknown at Unknown time  . polyethylene glycol (MIRALAX / GLYCOLAX) 17 g packet Take 17 g by mouth daily as needed for mild constipation. 14 each 0 Unknown at Unknown time   Scheduled: . amLODipine  5 mg Oral Daily  . aspirin EC  81 mg Oral Daily  . Chlorhexidine Gluconate Cloth  6 each Topical Q0600  . enoxaparin (LOVENOX) injection  40 mg Subcutaneous Q24H  . ferrous sulfate  325 mg Oral Daily  . fluticasone  2 spray Each Nare Daily  . fluticasone furoate-vilanterol  1 puff Inhalation Daily  . gabapentin  300 mg Oral TID  . ipratropium  0.5 mg Nebulization Q6H  . levalbuterol  1.25 mg Nebulization Q6H  . lisinopril  10 mg Oral Daily  . methylPREDNISolone (SOLU-MEDROL) injection  60 mg Intravenous Q6H  . metoCLOPramide  5 mg Oral TID AC & HS  . metoprolol succinate  25 mg  Oral Daily  . mirabegron ER  50 mg Oral Daily  . montelukast  10 mg Oral Daily  . multivitamin with minerals  1 tablet Oral Daily  . nicotine  14 mg Transdermal Daily  . oxyCODONE  10 mg Oral Q4H  . pantoprazole  40 mg Oral Daily  . simvastatin  10 mg Oral QHS  . vitamin C  500 mg Oral Daily   Continuous: . sodium chloride Stopped (09/03/18 0532)  . ceFEPime (MAXIPIME) IV Stopped (09/07/18 0107)  . fluconazole (DIFLUCAN) IV Stopped (09/06/18 1134)   OEU:MPNTIR chloride, albuterol, ipratropium-albuterol, LORazepam, morphine injection, polyethylene glycol  Assesment: He was  admitted with acute on chronic hypoxic respiratory failure.  He initially was improving then became significantly worse was transferred to stepdown unit started on IV steroids and started on BiPAP.  He has improved but remains occasionally on BiPAP and on high flow nasal cannula.  He is still dyspneic.  Morphine seems to help.  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    LOS: 6 days   Alonza Bogus 09/07/2018, 8:23 AM

## 2018-09-08 ENCOUNTER — Other Ambulatory Visit (HOSPITAL_COMMUNITY): Payer: Medicare Other

## 2018-09-08 ENCOUNTER — Ambulatory Visit (HOSPITAL_COMMUNITY): Admission: RE | Admit: 2018-09-08 | Payer: Medicare Other | Source: Ambulatory Visit

## 2018-09-08 DIAGNOSIS — I10 Essential (primary) hypertension: Secondary | ICD-10-CM

## 2018-09-08 DIAGNOSIS — A419 Sepsis, unspecified organism: Secondary | ICD-10-CM

## 2018-09-08 LAB — CREATININE, SERUM
Creatinine, Ser: 0.71 mg/dL (ref 0.61–1.24)
GFR calc Af Amer: >60 mL/min (ref >60)
GFR calc non Af Amer: >60 mL/min (ref >60)

## 2018-09-08 LAB — SARS CORONAVIRUS 2 (TAT 6-24 HRS): SARS Coronavirus 2: NEGATIVE

## 2018-09-08 MED ORDER — IPRATROPIUM BROMIDE 0.02 % IN SOLN
0.5000 mg | Freq: Four times a day (QID) | RESPIRATORY_TRACT | Status: DC
  Administered 2018-09-09 (×2): 0.5 mg via RESPIRATORY_TRACT
  Filled 2018-09-08 (×2): qty 2.5

## 2018-09-08 MED ORDER — LEVALBUTEROL HCL 1.25 MG/0.5ML IN NEBU
1.2500 mg | INHALATION_SOLUTION | Freq: Four times a day (QID) | RESPIRATORY_TRACT | Status: DC
  Administered 2018-09-09: 1.25 mg via RESPIRATORY_TRACT
  Filled 2018-09-08 (×2): qty 0.5

## 2018-09-08 NOTE — Progress Notes (Signed)
Subjective: He remains short of breath but says he feels fairly well.  He is on high flow nasal cannula now at about 8 L.  Oxygenation is excellent on 8 L.  He was evaluated by palliative care yesterday and confirms DNR/DNI status. Objective: Vital signs in last 24 hours: Temp:  [97.5 F (36.4 C)-97.9 F (36.6 C)] 97.5 F (36.4 C) (08/12 0400) Pulse Rate:  [71-98] 78 (08/12 0700) Resp:  [6-23] 23 (08/12 0600) BP: (148-189)/(80-103) 189/102 (08/12 0700) SpO2:  [94 %-100 %] 100 % (08/12 0700) Weight:  [65.5 kg] 65.5 kg (08/12 0600) Weight change: -0.9 kg Last BM Date: 08/30/18  Intake/Output from previous day: 08/11 0701 - 08/12 0700 In: 101.2 [IV Piggyback:101.2] Out: 1700 [Urine:1700]  PHYSICAL EXAM General appearance: alert, cooperative and Hard of hearing Resp: rales bilaterally Cardio: regular rate and rhythm, S1, S2 normal, no murmur, click, rub or gallop GI: soft, non-tender; bowel sounds normal; no masses,  no organomegaly Extremities: extremities normal, atraumatic, no cyanosis or edema  Lab Results:  Results for orders placed or performed during the hospital encounter of 08/31/18 (from the past 48 hour(s))  CBC with Differential/Platelet     Status: Abnormal   Collection Time: 09/07/18  3:41 AM  Result Value Ref Range   WBC 13.1 (H) 4.0 - 10.5 K/uL   RBC 4.61 4.22 - 5.81 MIL/uL   Hemoglobin 13.0 13.0 - 17.0 g/dL   HCT 41.8 39.0 - 52.0 %   MCV 90.7 80.0 - 100.0 fL   MCH 28.2 26.0 - 34.0 pg   MCHC 31.1 30.0 - 36.0 g/dL   RDW 16.4 (H) 11.5 - 15.5 %   Platelets 541 (H) 150 - 400 K/uL   nRBC 0.0 0.0 - 0.2 %   Neutrophils Relative % 84 %   Neutro Abs 11.0 (H) 1.7 - 7.7 K/uL   Lymphocytes Relative 10 %   Lymphs Abs 1.3 0.7 - 4.0 K/uL   Monocytes Relative 5 %   Monocytes Absolute 0.7 0.1 - 1.0 K/uL   Eosinophils Relative 0 %   Eosinophils Absolute 0.0 0.0 - 0.5 K/uL   Basophils Relative 0 %   Basophils Absolute 0.0 0.0 - 0.1 K/uL   Immature Granulocytes 1 %   Abs Immature Granulocytes 0.06 0.00 - 0.07 K/uL    Comment: Performed at [facility], [address], [place], [place] [address]  Comprehensive metabolic panel     Status: Abnormal   Collection Time: 09/07/18  3:41 AM  Result Value Ref Range   Sodium 139 135 - 145 mmol/L   Potassium 4.0 3.5 - 5.1 mmol/L   Chloride 99 98 - 111 mmol/L   CO2 32 22 - 32 mmol/L   Glucose, Bld 147 (H) 70 - 99 mg/dL   BUN 26 (H) 8 - 23 mg/dL   Creatinine, Ser 0.77 0.61 - 1.24 mg/dL   Calcium 8.7 (L) 8.9 - 10.3 mg/dL   Total Protein 8.1 6.5 - 8.1 g/dL   Albumin 2.6 (L) 3.5 - 5.0 g/dL   AST 25 15 - 41 U/L   ALT 26 0 - 44 U/L   Alkaline Phosphatase 18 (L) 38 - 126 U/L   Total Bilirubin 0.7 0.3 - 1.2 mg/dL   GFR calc non Af Amer >60 >60 mL/min   GFR calc Af Amer >60 >60 mL/min   Anion gap 8 5 - 15    Comment: Performed at [facility], [address], [place], [place] [address]  Magnesium  Status: None   Collection Time: 09/07/18  3:41 AM  Result Value Ref Range   Magnesium 2.1 1.7 - 2.4 mg/dL    Comment: Performed at [facility], [address] [place], [place] [address]  Creatinine, serum     Status: None   Collection Time: 09/08/18  6:00 AM  Result Value Ref Range   Creatinine, Ser 0.71 0.61 - 1.24 mg/dL   GFR calc non Af Amer >60 >60 mL/min   GFR calc Af Amer >60 >60 mL/min    Comment: Performed at [facility], [address] [place], [place] [address]    ABGS No results for input(s): PHART, PO2ART, TCO2, HCO3 in the last 72 hours.  Invalid input(s): PCO2 CULTURES Recent Results (from the past 240 hour(s))  Urine culture     Status: None   Collection Time: 08/31/18  9:45 PM   Specimen: In/Out Cath Urine  Result Value Ref Range Status   Specimen Description   Final    IN/OUT CATH URINE Performed at [facility], [address] [place], [place] [address]    Special Requests   Final    NONE Performed at [facility], [address] [place], [place] [address]    Culture   Final    NO  GROWTH Performed at [facility], [place] [address], [place], [place] [address]    Report Status 09/02/2018 FINAL  Final  SARS Coronavirus 2 [facility] order, Performed in [facility] hospital lab) Nasopharyngeal Nasopharyngeal Swab     Status: None   Collection Time: 08/31/18  9:46 PM   Specimen: Nasopharyngeal Swab  Result Value Ref Range Status   SARS Coronavirus 2 NEGATIVE NEGATIVE Final    Comment: (NOTE) If result is NEGATIVE SARS-CoV-2 target nucleic acids are NOT DETECTED. The SARS-CoV-2 RNA is generally detectable in upper and lower  respiratory specimens during the acute phase of infection. The lowest  concentration of SARS-CoV-2 viral copies this assay can detect is 250  copies / mL. A negative result does not preclude SARS-CoV-2 infection  and should not be used as the sole basis for treatment or other  patient management decisions.  A negative result may occur with  improper specimen collection / handling, submission of specimen other  than nasopharyngeal swab, presence of viral mutation(s) within the  areas targeted by this assay, and inadequate number of viral copies  (<250 copies / mL). A negative result must be combined with clinical  observations, patient history, and epidemiological information. If result is POSITIVE SARS-CoV-2 target nucleic acids are DETECTED. The SARS-CoV-2 RNA is generally detectable in upper and lower  respiratory specimens dur ing the acute phase of infection.  Positive  results are indicative of active infection with SARS-CoV-2.  Clinical  correlation with patient history and other diagnostic information is  necessary to determine patient infection status.  Positive results do  not rule out bacterial infection or co-infection with other viruses. If result is PRESUMPTIVE POSTIVE SARS-CoV-2 nucleic acids MAY BE PRESENT.   A presumptive positive result was obtained on the submitted specimen  and confirmed on repeat testing.  While 2019  novel coronavirus  (SARS-CoV-2) nucleic acids may be present in the submitted sample  additional confirmatory testing may be necessary for epidemiological  and / or clinical management purposes  to differentiate between  SARS-CoV-2 and other Sarbecovirus currently known to infect humans.  If clinically indicated additional testing with an alternate test  methodology [phone]) is advised. The SARS-CoV-2 RNA is generally  detectable in upper and lower respiratory sp ecimens during the acute  phase of infection. The expected result is Negative. Fact Sheet for Patients:  StrictlyIdeas.no Fact Sheet for Healthcare Providers: BankingDealers.co.za This test is not yet approved or cleared by the [place] FDA and has been authorized for detection and/or diagnosis of SARS-CoV-2 by FDA under an Emergency Use Authorization (EUA).  This EUA will remain in effect (meaning this test can be used) for the duration of the COVID-19 declaration under Section 564(b)(1) of the Act, 21 U.S.C. section 360bbb-3(b)(1), unless the authorization is terminated or revoked sooner. Performed at [facility], [address] [place], [place] [address]   Blood Culture (routine x 2)     Status: Abnormal   Collection Time: 08/31/18 10:03 PM   Specimen: BLOOD RIGHT HAND  Result Value Ref Range Status   Specimen Description   Final    BLOOD RIGHT HAND Performed at [facility], [address], [place], [place] [address]    Special Requests   Final    BOTTLES DRAWN AEROBIC AND ANAEROBIC Blood Culture adequate volume Performed at [facility], [address] [place], [place] [address]    Culture  Setup Time   Final    GRAM POSITIVE COCCI IN BOTH AEROBIC AND ANAEROBIC BOTTLES Gram Stain Report Called to,Read Back By and Verified With: MINTER,R ON 09/01/18 AT 37 BY LOY,C PERFORMED AT [facility] CRITICAL RESULT CALLED TO, READ BACK BY AND VERIFIED WITH: C HOPKINS RN 09/01/18 2220  JDW    Culture (A)  Final    STAPHYLOCOCCUS SPECIES (COAGULASE NEGATIVE) THE SIGNIFICANCE OF ISOLATING THIS ORGANISM FROM A SINGLE SET OF BLOOD CULTURES WHEN MULTIPLE SETS ARE DRAWN IS UNCERTAIN. PLEASE NOTIFY THE MICROBIOLOGY DEPARTMENT WITHIN ONE WEEK IF SPECIATION AND SENSITIVITIES ARE REQUIRED. Performed at [facility], [place] [address], [place], [place] [address]    Report Status 09/03/2018 FINAL  Final  Blood Culture ID Panel (Reflexed)     Status: Abnormal   Collection Time: 08/31/18 10:03 PM  Result Value Ref Range Status   Enterococcus species NOT DETECTED NOT DETECTED Final   Listeria monocytogenes NOT DETECTED NOT DETECTED Final   Staphylococcus species DETECTED (A) NOT DETECTED Final    Comment: Methicillin (oxacillin) resistant coagulase negative staphylococcus. Possible blood culture contaminant (unless isolated from more than one blood culture draw or clinical case suggests pathogenicity). No antibiotic treatment is indicated for blood  culture contaminants. CRITICAL RESULT CALLED TO, READ BACK BY AND VERIFIED WITH: C HOPKINS RN 09/01/18 2220 JDW    Staphylococcus aureus (BCID) NOT DETECTED NOT DETECTED Final   Methicillin resistance DETECTED (A) NOT DETECTED Final    Comment: CRITICAL RESULT CALLED TO, READ BACK BY AND VERIFIED WITH: C HOPKINS RN 09/01/18 2220 JDW    Streptococcus species NOT DETECTED NOT DETECTED Final   Streptococcus agalactiae NOT DETECTED NOT DETECTED Final   Streptococcus pneumoniae NOT DETECTED NOT DETECTED Final   Streptococcus pyogenes NOT DETECTED NOT DETECTED Final   Acinetobacter baumannii NOT DETECTED NOT DETECTED Final   Enterobacteriaceae species NOT DETECTED NOT DETECTED Final   Enterobacter cloacae complex NOT DETECTED NOT DETECTED Final   Escherichia coli NOT DETECTED NOT DETECTED Final   Klebsiella oxytoca NOT DETECTED NOT DETECTED Final   Klebsiella pneumoniae NOT DETECTED NOT DETECTED Final   Proteus species NOT DETECTED NOT  DETECTED Final   Serratia marcescens NOT DETECTED NOT DETECTED Final   Haemophilus influenzae NOT DETECTED NOT DETECTED Final   Neisseria meningitidis NOT DETECTED  NOT DETECTED Final   Pseudomonas aeruginosa NOT DETECTED NOT DETECTED Final   Candida albicans NOT DETECTED NOT DETECTED Final   Candida glabrata NOT DETECTED NOT DETECTED Final   Candida krusei NOT DETECTED NOT DETECTED Final   Candida parapsilosis NOT DETECTED NOT DETECTED Final   Candida tropicalis NOT DETECTED NOT DETECTED Final    Comment: Performed at [facility], [place] [address] [place], [place] [address]  Blood Culture (routine x 2)     Status: None   Collection Time: 08/31/18 10:04 PM   Specimen: BLOOD RIGHT ARM  Result Value Ref Range Status   Specimen Description BLOOD RIGHT ARM  Final   Special Requests   Final    BOTTLES DRAWN AEROBIC AND ANAEROBIC Blood Culture adequate volume   Culture   Final    NO GROWTH 5 DAYS Performed at [facility], [address], [place], [place] [address]    Report Status 09/05/2018 FINAL  Final  Culture, respiratory     Status: None   Collection Time: 09/01/18 11:27 AM  Result Value Ref Range Status   Specimen Description   Final    EXPECTORATED SPUTUM Performed at [facility], [address], [place], [place] [address]    Special Requests   Final    NONE Performed at [facility], [address], [place], [place] [address]    Gram Stain   Final    FEW WBC PRESENT,BOTH PMN AND MONONUCLEAR FEW GRAM POSITIVE COCCI FEW GRAM VARIABLE ROD ABUNDANT YEAST ABUNDANT SQUAMOUS EPITHELIAL CELLS PRESENT    Culture   Final    Consistent with normal respiratory flora. Performed at [facility], [place] [address], [place], [place] [address]    Report Status 09/03/2018 FINAL  Final  MRSA PCR Screening     Status: None   Collection Time: 09/01/18  8:39 PM   Specimen: Nasal Mucosa; Nasopharyngeal  Result Value Ref Range Status   MRSA by PCR NEGATIVE NEGATIVE Final     Comment:        The GeneXpert MRSA Assay (FDA approved for NASAL specimens only), is one component of a comprehensive MRSA colonization surveillance program. It is not intended to diagnose MRSA infection nor to guide or monitor treatment for MRSA infections. Performed at [facility], [address], [place], [place] [address]    Studies/Results: No results found.  Medications:  Prior to Admission:  Medications Prior to Admission  Medication Sig Dispense Refill Last Dose  . ADVAIR HFA 115-21 MCG/ACT inhaler Inhale 2 puffs into the lungs 2 (two) times daily. 1 Inhaler 4 08/31/2018 at Unknown time  . amLODipine (NORVASC) 5 MG tablet TAKE 1 TABLET BY MOUTH EVERY DAY (Patient taking differently: Take 5 mg by mouth daily. ) 90 tablet 1 08/31/2018 at Unknown time  . Ascorbic Acid (VITAMIN C) 500 MG CAPS Take 500 mg by mouth daily.   08/31/2018 at Unknown time  . aspirin EC 81 MG tablet Take 81 mg by mouth daily.    08/31/2018 at Unknown time  . cyclobenzaprine (FLEXERIL) 5 MG tablet Take 5 mg by mouth 3 (three) times daily.    08/31/2018 at Unknown time  . Ferrous Sulfate (IRON) 325 (65 Fe) MG TABS Take 1 tablet by mouth at bedtime.    08/31/2018 at Unknown time  . fluticasone (FLONASE) 50 MCG/ACT nasal spray Place 2 sprays into both nostrils daily.  6 08/31/2018 at Unknown time  . furosemide (LASIX) 20 MG tablet Take 1 tablet (  20 mg total) by mouth daily. 10 tablet 0 08/31/2018 at Unknown time  . gabapentin (NEURONTIN) 300 MG capsule Take 1 capsule by mouth 3 (three) times daily.  2 08/31/2018 at Unknown time  . ipratropium-albuterol (DUONEB) 0.5-2.5 (3) MG/3ML SOLN Take 3 mLs by nebulization every 6 (six) hours as needed (for shortness of breath).   08/31/2018 at Unknown time  . lidocaine-prilocaine (EMLA) cream Apply 1 application topically as needed. Apply a small amount to port site and cover with plastic wrap 1 hour prior to infusion appointments 30 g 3 Past Week at Unknown time  . lisinopril (ZESTRIL) 20  MG tablet Take 1 tablet (20 mg total) by mouth daily.   08/31/2018 at Unknown time  . metoCLOPramide (REGLAN) 5 MG tablet Take 1 tablet by mouth 4 (four) times daily -  before meals and at bedtime. Daily as needed  5 Past Month at Unknown time  . metoprolol succinate (TOPROL-XL) 25 MG 24 hr tablet Take 25 mg by mouth daily.   08/31/2018 at 0800  . Multiple Vitamins-Minerals (MULTIVITAMIN WITH MINERALS) tablet Take 1 tablet by mouth daily.   08/31/2018 at Unknown time  . MYRBETRIQ 50 MG TB24 tablet Take 1 tablet by mouth daily.   08/31/2018 at Unknown time  . nitroGLYCERIN (NITROSTAT) 0.4 MG SL tablet Place 0.4 mg under the tongue every 5 (five) minutes as needed for chest pain.   unknown  . Nivolumab (OPDIVO IV) Inject into the vein every 14 (fourteen) days.   Past Month at Unknown time  . Oxycodone HCl 10 MG TABS Take 1 tablet by mouth every 4 (four) hours.    08/31/2018 at Unknown time  . potassium gluconate (HM POTASSIUM) 595 (99 K) MG TABS tablet Take 595 mg by mouth daily.   08/31/2018 at Unknown time  . PRILOSEC 40 MG capsule Take 1 capsule by mouth daily.   08/31/2018 at Unknown time  . simvastatin (ZOCOR) 10 MG tablet Take 10 mg by mouth at bedtime.   3 08/31/2018 at Unknown time  . VENTOLIN HFA 108 (90 Base) MCG/ACT inhaler Inhale 2 puffs into the lungs every 4 (four) hours as needed for wheezing or shortness of breath. 18 g 5 Past Month at Unknown time  . Ipilimumab (YERVOY IV) Inject into the vein every 6 (six) weeks.   Unknown at Unknown time  . polyethylene glycol (MIRALAX / GLYCOLAX) 17 g packet Take 17 g by mouth daily as needed for mild constipation. 14 each 0 Unknown at Unknown time   Scheduled: . amLODipine  5 mg Oral Daily  . aspirin EC  81 mg Oral Daily  . Chlorhexidine Gluconate Cloth  6 each Topical Q0600  . enoxaparin (LOVENOX) injection  40 mg Subcutaneous Q24H  . ferrous sulfate  325 mg Oral Daily  . fluticasone  2 spray Each Nare Daily  . fluticasone furoate-vilanterol  1 puff  Inhalation Daily  . gabapentin  300 mg Oral TID  . ipratropium  0.5 mg Nebulization Q6H  . levalbuterol  1.25 mg Nebulization Q6H  . lisinopril  10 mg Oral Daily  . methylPREDNISolone (SOLU-MEDROL) injection  60 mg Intravenous Q6H  . metoCLOPramide  5 mg Oral TID AC & HS  . metoprolol succinate  25 mg Oral Daily  . mirabegron ER  50 mg Oral Daily  . montelukast  10 mg Oral Daily  . multivitamin with minerals  1 tablet Oral Daily  . nicotine  14 mg Transdermal Daily  . oxyCODONE  10  mg Oral Q4H  . pantoprazole  40 mg Oral Daily  . simvastatin  10 mg Oral QHS  . vitamin C  500 mg Oral Daily   Continuous: . sodium chloride Stopped (09/03/18 0532)  . fluconazole (DIFLUCAN) IV Stopped (09/07/18 1341)   TIW:PYKDXI chloride, albuterol, ipratropium-albuterol, LORazepam, morphine injection, polyethylene glycol  Assesment: He was initially admitted with healthcare associated pneumonia and acute on chronic hypoxic respiratory failure.  He initially improved and then had deterioration with increased shortness of breath and increased oxygen requirement.  He was placed on BiPAP started on IV steroids.  He has improved some since then but still requiring high flow oxygen.  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  Try to wean his oxygen down to a point that he could go home  or to rehab which per palliative care he would agree to do    LOS: 7 days   Alonza Bogus 09/08/2018, 7:47 AM

## 2018-09-08 NOTE — TOC Progression Note (Signed)
Transition of Care [facility]) - Progression Note    Patient Details  Name: Bradley [name] MRN: [id number] Date of Birth: 12-07-1940  Transition of [facility]) CM/SW Contact  Boneta Lucks, RN Phone Number: 09/08/2018, 2:42 PM  Clinical Narrative:   Ihor Dow , Palliative NP and Dr Tat spoke with daughter and patient. They now all agree that [facility] is best.  CM sent referral to [facility], she is reviewing the chart.  They do have a bed.  Pt needs a more recent COVID test.  MD aware to order.  Due to family issues, MD states discharge will be tomorrow. Cassandra updated.    Expected Discharge Plan: [place] Barriers to Discharge: No Barriers Identified  Expected Discharge Plan and Services Expected Discharge Plan: [place]   Discharge Planning Services: CM Consult Post Acute Care Choice: [place] arrangements for the past 2 months: Single Family Home                      Social Determinants of Health (SDOH) Interventions    Readmission Risk Interventions Readmission Risk Prevention Plan 08/05/2018 08/04/2018  Transportation Screening - Complete  Medication Review [employer]) - Complete  PCP or Specialist appointment within 3-5 days of discharge Complete -  [place] or [place] - Complete  SW Recovery Care/Counseling Consult - Complete  [place] - Complete

## 2018-09-08 NOTE — Progress Notes (Addendum)
Daily Progress Note   Patient Name: Bradley [name]       Date: 09/08/2018 DOB: April 11, 1940  Age: 78 y.o. MRN#: [id number] Attending Physician: Orson Eva, MD Primary Care Physician: Jani Gravel, MD Admit Date: 08/31/2018  Reason for Consultation/Follow-up: Establishing goals of care  Subjective: Patient awake but more confused this afternoon. Replaced his oxygen, which he had removed. He is anxious and frustrated that he is not able to speak with his brother in [place] because the room phone only dials local numbers. He is upset that his daughter Bradley [name] does not understand he cannot call his brother in [place] from room phone. He tells me he is leaving for [facility] in the morning. He also asks if there is something biting his forehead, for which I reassured that there is nothing biting his forehead.   GOC:  No family at bedside. Spoke with daughter Bradley [name] via telephone at patient bedside.   Discussed course of hospitalization including diagnoses, interventions, and plan of care. Discussed high risk for decline requiring recurrent hospitalization.   Patient previously told this NP he is agreeable to attempt rehab. Daughter does not want him to discharge to SNF because she will not be able to see him. Also, he is very weak and very likely that he will not progress well at SNF. Bradley [name] is unable to care for him at home with hospice services. Bradley [name] is requesting hospice facility placement.   Explained to patient and daughter hospice options and philosophy, including discontinuation of interventions not aimed at comfort including immunotherapy and oncology follow-up. Explained focus on comfort, quality, dignity at EOL. Also symptom management and prevention of recurrent hospitalization. Although patient  is confused this afternoon, he does understand a transition to hospice would mean no further immunotherapy or aggressive medical interventions.   Bradley [name] feels her father is getting closer to EOL. She recently showed him a picture of his first great grandchildren (which was one of his final life wishes, to see his first great grandchild) and since then, his health has continued to decline. He has been seeing and speaking to his dead granddaughter Bradley [name], engineering daughter). Today, he is more confused with poor appetite.   Bradley [name] wishes for her father to be "happy and comfortable." She wants him well taken care of and his symptoms to be properly managed during  acute episodes of dyspnea. She is hopeful the hospice facility will accept him.   Shelia requests that I place hospice referral today. She also requests to speak with Dr. Delton Coombes if possible prior to transition to hospice facility. Shelia and I plan to meet at [facility] in person.   Answered questions and concerns. Therapeutic listening and emotional support provided.    Length of Stay: 7  Current Medications: Scheduled Meds:  . amLODipine  5 mg Oral Daily  . aspirin EC  81 mg Oral Daily  . Chlorhexidine Gluconate Cloth  6 each Topical Q0600  . enoxaparin (LOVENOX) injection  40 mg Subcutaneous Q24H  . ferrous sulfate  325 mg Oral Daily  . fluticasone  2 spray Each Nare Daily  . fluticasone furoate-vilanterol  1 puff Inhalation Daily  . gabapentin  300 mg Oral TID  . ipratropium  0.5 mg Nebulization Q6H  . levalbuterol  1.25 mg Nebulization Q6H  . lisinopril  10 mg Oral Daily  . methylPREDNISolone (SOLU-MEDROL) injection  60 mg Intravenous Q6H  . metoCLOPramide  5 mg Oral TID AC & HS  . metoprolol succinate  25 mg Oral Daily  . mirabegron ER  50 mg Oral Daily  . montelukast  10 mg Oral Daily  . multivitamin with minerals  1 tablet Oral Daily  . nicotine  14 mg Transdermal Daily  . oxyCODONE  10 mg Oral Q4H  . pantoprazole  40 mg Oral  Daily  . simvastatin  10 mg Oral QHS  . vitamin C  500 mg Oral Daily    Continuous Infusions: . sodium chloride Stopped (09/03/18 0532)    PRN Meds: sodium chloride, albuterol, ipratropium-albuterol, LORazepam, morphine injection, polyethylene glycol  Physical Exam Vitals signs and nursing note reviewed.  Constitutional:      General: He is awake.     Appearance: He is cachectic. He is ill-appearing.  HENT:     Head: Normocephalic and atraumatic.  Cardiovascular:     Rate and Rhythm: Normal rate.  Pulmonary:     Effort: No tachypnea, accessory muscle usage or respiratory distress.  Skin:    General: Skin is warm and dry.  Neurological:     Mental Status: He is alert.     Comments: Oriented to person/place. Intermittently confused and forgetful. Irritable this afternoon.             Vital Signs: BP (!) 176/91   Pulse 78   Temp 97.6 F (36.4 C) (Axillary)   Resp (!) 23   Ht 6' (1.829 m)   Wt 65.5 kg   SpO2 100%   BMI 19.58 kg/m  SpO2: SpO2: 100 % O2 Device: O2 Device: Nasal Cannula O2 Flow Rate: O2 Flow Rate (L/min): 6 L/min  Intake/output summary:   Intake/Output Summary (Last 24 hours) at 09/08/2018 1421 Last data filed at 09/08/2018 0518 Gross per 24 hour  Intake 101.22 ml  Output 1700 ml  Net -1598.78 ml   LBM: Last BM Date: 08/30/18 Baseline Weight: Weight: 68 kg Most recent weight: Weight: 65.5 kg       Palliative Assessment/Data: PPS 30%      Patient Active Problem List   Diagnosis Date Noted  . Sepsis due to undetermined organism ([place]) 09/08/2018  . Acute and chronic respiratory failure with hypoxia ([place]) 09/01/2018  . Dyspnea   . Hypoxia   . Acute respiratory failure with hypoxia ([place]) 08/03/2018  . Hyperglycemia 08/03/2018  . Acute on chronic respiratory failure with hypoxia ([place]) 08/03/2018  .  Recurrent left pleural effusion 06/25/2018  . Postobstructive pneumonia 06/25/2018  . On home O2 06/25/2018  . Acute on chronic respiratory  failure ([place]) 06/25/2018  . Metastatic cancer ([place])   . Palliative care encounter   . Encounter for hospice care discussion   . HCAP (healthcare-associated pneumonia) 06/13/2018  . Acute maxillary sinusitis 12/30/2017  . Palliative care by specialist   . Goals of care, counseling/discussion   . Malignant neoplasm of left lung ([place])   . Left leg weakness   . Nosocomial pneumonia 12/29/2017  . 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 Mass of lower lobe of left lung 12/10/2017  . Chest pain in adult   . Syncope and collapse   . Rt Hip fracture ([place]) 12/19/2016  . Chest pain 09/12/2016  . Leukocytosis 09/12/2016  . Abnormal chest x-ray 09/12/2016  . Hyperlipidemia 09/12/2016  . Hypokalemia 09/12/2016  . Precordial pain   . Tobacco abuse 09/11/2016  . COPD (chronic obstructive pulmonary disease) ([place]) 09/11/2016  . CAD (coronary artery disease), native coronary artery 09/11/2016  . Chronic pain syndrome 09/11/2016  . Essential hypertension 09/11/2016  . Right-sided epistaxis 03/15/2016    Palliative Care Assessment & Plan   Patient Profile:  78 y.o. male  with past medical history of non-small cell lung cancer, COPD on home oxygen, CAD, HLD, anemia, HTN, smoker admitted on 08/31/2018 with shortness of breath and recurrent HCAP. Patient admitted for acute on chronic respiratory failure, initially showing improvement but required transfer to stepdown ICU for BiPAP/HFNC. Sepsis secondary to HCAP now resolved. Followed by Dr. Delton Coombes for [place]. Palliative medicine consultation for goals of care.   Assessment: Acute on chronic respiratory failure with hypoxia COPD exacerbation HCAP Non-small cell lung cancer Sepsis Deconditioning Chronic neoplastic associated pain  Recommendations/Plan:  DNR/DNI  Patient more confused today and with declining nutritional status. Discussed hospice options and philosophy with patient and daughter.  Daughter requesting comfort  focused care plan and transition to hospice facility for EOL care and symptom management. TOC RN/SW notified.  Continue prn morphine for pain/dyspnea  Daughter will be at bedside around 9am. PMT provider to meet with her in AM.   Code Status: DNR/DNI   Code Status Orders  (From admission, onward)         Start     Ordered   09/01/18 0026  Do not attempt resuscitation (DNR)  Continuous    Question Answer Comment  In the event of cardiac or respiratory ARREST Do not call a "code blue"   In the event of cardiac or respiratory ARREST Do not perform Intubation, CPR, defibrillation or ACLS   In the event of cardiac or respiratory ARREST Use medication by any route, position, wound care, and other measures to relive pain and suffering. May use oxygen, suction and manual treatment of airway obstruction as needed for comfort.      09/01/18 0025        Code Status History    Date Active Date Inactive Code Status Order ID Comments User Context   08/03/2018 2110 08/05/2018 1731 DNR [id number]  Jani Gravel, MD ED   06/25/2018 1604 06/30/2018 1802 DNR [id number]  Murlean Iba, MD Inpatient   06/13/2018 2103 06/18/2018 2024 DNR [id number]  Bethena Roys, MD Inpatient   12/29/2017 1139 01/02/2018 0408 DNR [id number]  Murlean Iba, MD Inpatient   11/25/2017 2217 11/28/2017 1350 Full Code [id number]  Bethena Roys, MD ED   12/19/2016 1829 12/23/2016 2124 Full  Code [id number]  Roxan Hockey, MD Inpatient   09/11/2016 1832 09/12/2016 1853 Full Code [id number]  Orson Eva, MD Inpatient   09/11/2016 1310 09/11/2016 1831 Full Code [id number]  Ilean [facility] ED   03/15/2016 1628 03/16/2016 1547 Full Code [id number]  Jodi Marble, MD Inpatient   Advance Care Planning Activity    Advance Directive Documentation     Most Recent Value  Type of Advance Directive  Living will, Healthcare Power of Attorney  Pre-existing out of facility DNR order (yellow form or pink MOST form)  -  "MOST"  Form in Place?  -       Prognosis:   Poor prognosis possibly weeks with chronic respiratory failure secondary to metastatic NSCLC, COPD, recurrent pneumonia. Declining functional and nutritional status. High risk for respiratory decompensation and death.  Discharge Planning:  Hospice facility  Care plan was discussed with patient, daughter, RN, Dr Tat  Thank you for allowing the Palliative Medicine Team to assist in the care of this patient.   Time In: 1330 Time Out: 1430 Total Time 60 Prolonged Time Billed no      Greater than 50%  of this time was spent counseling and coordinating care related to the above assessment and plan.  Ihor Dow, DNP, FNP-C Palliative Medicine Team  Phone: [phone] Fax: [phone]  Please contact Palliative Medicine Team phone at [phone] for questions and concerns.

## 2018-09-08 NOTE — TOC Progression Note (Signed)
Transition of Care [facility]) - Progression Note    Patient Details  Name: Bradley [name] MRN: [id number] Date of Birth: [birth year]  [facility] CM/SW Contact  Bradley Lucks, RN Phone Number: 09/08/2018, 12:39 PM  Clinical Narrative:   MD suggesting SNF at discharge. CM spoke with Bradley [name] his daughter, she is refusing SNF, because she can not visit him.  She state the Cancer doctor told her he has less than 3 months to live and she wants to see him daily. She plans to visit him this evening and discuss hospice. MD is aware of this and will discuss with palliative. Bradley [name] is concerned with caring for him at home, she has back problems. She does not like the facts that he will not listen to her a will smoke.  Patient is active with [facility]. Bradley [name] is aware of his admission and is following for a discharge plan.       Expected Discharge Plan: [place] Barriers to Discharge: No Barriers Identified  Expected Discharge Plan and Services Expected Discharge Plan: [place]   Discharge Planning Services: CM Consult Post Acute Care Choice: [place] arrangements for the past 2 months: Single Family Home                           Readmission Risk Interventions Readmission Risk Prevention Plan 08/05/2018 08/04/2018  Transportation Screening - Complete  Medication Review [employer]) - Complete  PCP or Specialist appointment within 3-5 days of discharge Complete -  [place] or [place] - Complete  SW Recovery Care/Counseling Consult - Complete  [place] - Complete

## 2018-09-08 NOTE — Care Management Important Message (Signed)
Important Message  Patient Details  Name: Bradley [name] MRN: [id number] Date of Birth: [birth year]   Medicare Important Message Given:  Yes     Tommy Medal 09/08/2018, 2:24 PM

## 2018-09-08 NOTE — Progress Notes (Signed)
PROGRESS NOTE  Bradley [name] ZHY:[id number] DOB: 17-Aug-1940 DOA: 08/31/2018 PCP: Jani Gravel, MD  Brief History:  78 year old male with a history of non-small cell lung cancer, COPD, coronary disease, hyperlipidemia, hypertension presenting on 08/31/2018 secondary to shortness of breath.  Apparently, the patient was having some shortness of breath and did not look good per his daughter while eating dinner on 08/31/2018.  EMS was activated.  At baseline, the patient is supposed to be on 4 L nasal cannula at home.  Upon arrival, the patient was noted to have oxygen saturation of 77% in the emergency department.  He required 7 L to maintain oxygen saturations above 90%.  Upon presentation, the patient was started on vancomycin and cefepime.  Vancomycin has been since discontinued.  The patient continued to have slow clinical course.  He required placement on BiPAP.  Pulmonary medicine was consulted to assist with management.  The patient was started on IV steroids.  Palliative medicine was consulted to discuss goals of care.  The patient was subsequently weaned off of BiPAP on 09/04/2018.  Assessment/Plan: Acute on chronic respiratory failure with hypoxia -The patient is on 3-4 L nasal cannula at home -Secondary to pneumonia and COPD exacerbation in the setting of underlying COPD and pulmonary fibrosis -Wean oxygen as tolerated back to baseline  COPD exacerbation -Continue IV Solu-Medrol -Continue Xopenex and Atrovent -Continue Breo  Sepsis -Present on admission -Secondary to pneumonia -Blood culture suggest contaminant -Urinalysis negative for pyuria -Sepsis physiology resolved  Healthcare associated pneumonia -finished 7 days cefepime on 09/07/18 -Personally reviewed chest x-ray--diffuse interstitial coarsening; R>L basilar infiltrates  Non-small cell lung cancer -Patient follows Dr. Delton Coombes -L-side pleurx catheter removed 08/25/18--Dr. Prescott Gum  Essential hypertension  -Continue amlodipine, lisinopril, metoprolol succinate -Blood pressure remains acceptable  Chronic neoplastic associated pain -Continue home dose of oxycodone 10 mg as needed for pain -Continue gabapentin  Hyperlipidemia -Continue statin  Tobacco Abuse -cessation discussed  Deconditioning -PT evaluation   Disposition Plan:   SNF in 1-2 days  Family Communication:   No Family at bedside  Consultants:  pulmonary  Code Status:  DNR  DVT Prophylaxis:  [place] Lovenox   Procedures: As Listed in Progress Note Above  Antibiotics: Cefepime 8/5>>>8/11       Subjective: Pt complains of cp, about same as usual.  Denies f/c, n/v/d, abd pain.  States he is hungry.  No dysuria or headache  Objective: Vitals:   09/08/18 0700 09/08/18 0750 09/08/18 0751 09/08/18 0900  BP: (!) 189/102   (!) 165/86  Pulse: 78   73  Resp:      Temp:      TempSrc:      SpO2: 100% 96% 99% 99%  Weight:      Height:        Intake/Output Summary (Last 24 hours) at 09/08/2018 0956 Last data filed at 09/08/2018 0518 Gross per 24 hour  Intake 101.22 ml  Output 1700 ml  Net -1598.78 ml   Weight change: -0.9 kg Exam:   General:  Pt is alert, follows commands appropriately, not in acute distress  HEENT: No icterus, No thrush, No neck mass, [place]/AT  Cardiovascular: RRR, S1/S2, no rubs, no gallops  Respiratory: bibasilar rales, no wheeze.  Diminished breath sounds bilateral  Abdomen: Soft/+BS, non tender, non distended, no guarding  Extremities: No edema, No lymphangitis, No petechiae, No rashes, no synovitis   Data Reviewed: I have personally reviewed following labs and imaging studies  Basic Metabolic Panel: Recent Labs  Lab 09/02/18 0917 09/03/18 0512 09/04/18 0442 09/07/18 0341 09/08/18 0600  NA 133* 131* 136 139  --   K 3.7 3.7 3.9 4.0  --   CL 102 99 102 99  --   CO2 23 23 24  32  --   GLUCOSE 100* 101* 133* 147*  --   BUN 14 15 26* 26*  --   CREATININE 0.83 0.85 0.79 0.77  0.71  CALCIUM 8.3* 8.5* 8.7* 8.7*  --   MG  --   --   --  2.1  --    Liver Function Tests: Recent Labs  Lab 09/02/18 0917 09/03/18 0512 09/07/18 0341  AST 15 14* 25  ALT 9 10 26   ALKPHOS 23* 23* 18*  BILITOT 1.3* 1.0 0.7  PROT 8.1 7.8 8.1  ALBUMIN 2.5* 2.4* 2.6*   No results for input(s): LIPASE, AMYLASE in the last 168 hours. No results for input(s): AMMONIA in the last 168 hours. Coagulation Profile: No results for input(s): INR, PROTIME in the last 168 hours. CBC: Recent Labs  Lab 09/02/18 0917 09/03/18 0512 09/04/18 0442 09/07/18 0341  WBC 12.0* 15.0* 10.9* 13.1*  NEUTROABS  --   --   --  11.0*  HGB 12.9* 13.0 12.4* 13.0  HCT 41.4 41.3 39.0 41.8  MCV 91.4 90.0 88.8 90.7  PLT 554* 529* 525* 541*   Cardiac Enzymes: No results for input(s): CKTOTAL, CKMB, CKMBINDEX, TROPONINI in the last 168 hours. BNP: Invalid input(s): POCBNP CBG: No results for input(s): GLUCAP in the last 168 hours. HbA1C: No results for input(s): HGBA1C in the last 72 hours. Urine analysis:    Component Value Date/Time   COLORURINE YELLOW 08/31/2018 2145   APPEARANCEUR CLEAR 08/31/2018 2145   LABSPEC 1.017 08/31/2018 2145   PHURINE 5.0 08/31/2018 2145   GLUCOSEU NEGATIVE 08/31/2018 2145   HGBUR NEGATIVE 08/31/2018 2145   BILIRUBINUR NEGATIVE 08/31/2018 2145   [place] NEGATIVE 08/31/2018 2145   PROTEINUR NEGATIVE 08/31/2018 2145   NITRITE NEGATIVE 08/31/2018 2145   LEUKOCYTESUR NEGATIVE 08/31/2018 2145   Sepsis Labs: @LABRCNTIP (procalcitonin:4,lacticidven:4) ) Recent Results (from the past 240 hour(s))  Urine culture     Status: None   Collection Time: 08/31/18  9:45 PM   Specimen: In/Out Cath Urine  Result Value Ref Range Status   Specimen Description   Final    IN/OUT CATH URINE Performed at [facility], [address] [place], [place] [address]    Special Requests   Final    NONE Performed at [facility], [address] [place], [place] [address]    Culture   Final     NO GROWTH Performed at [facility], [place] [address] [place], [place] [address]    Report Status 09/02/2018 FINAL  Final  SARS Coronavirus 2 [facility] order, Performed in [facility] [place] [facility] hospital lab) Nasopharyngeal Nasopharyngeal Swab     Status: None   Collection Time: 08/31/18  9:46 PM   Specimen: Nasopharyngeal Swab  Result Value Ref Range Status   SARS Coronavirus 2 NEGATIVE NEGATIVE Final    Comment: (NOTE) If result is NEGATIVE SARS-CoV-2 target nucleic acids are NOT DETECTED. The SARS-CoV-2 RNA is generally detectable in upper and lower  respiratory specimens during the acute phase of infection. The lowest  concentration of SARS-CoV-2 viral copies this assay can detect is 250  copies / mL. A negative result does not preclude SARS-CoV-2 infection  and should not be used as the sole basis for  treatment or other  patient management decisions.  A negative result may occur with  improper specimen collection / handling, submission of specimen other  than nasopharyngeal swab, presence of viral mutation(s) within the  areas targeted by this assay, and inadequate number of viral copies  (<250 copies / mL). A negative result must be combined with clinical  observations, patient history, and epidemiological information. If result is POSITIVE SARS-CoV-2 target nucleic acids are DETECTED. The SARS-CoV-2 RNA is generally detectable in upper and lower  respiratory specimens dur ing the acute phase of infection.  Positive  results are indicative of active infection with SARS-CoV-2.  Clinical  correlation with patient history and other diagnostic information is  necessary to determine patient infection status.  Positive results do  not rule out bacterial infection or co-infection with other viruses. If result is PRESUMPTIVE POSTIVE SARS-CoV-2 nucleic acids MAY BE PRESENT.   A presumptive positive result was obtained on the submitted specimen  and confirmed on repeat testing.  While  2019 novel coronavirus  (SARS-CoV-2) nucleic acids may be present in the submitted sample  additional confirmatory testing may be necessary for epidemiological  and / or clinical management purposes  to differentiate between  SARS-CoV-2 and other Sarbecovirus currently known to infect humans.  If clinically indicated additional testing with an alternate test  methodology [phone]) is advised. The SARS-CoV-2 RNA is generally  detectable in upper and lower respiratory sp ecimens during the acute  phase of infection. The expected result is Negative. Fact Sheet for Patients:  StrictlyIdeas.no Fact Sheet for Healthcare Providers: BankingDealers.co.za This test is not yet approved or cleared by the [place] FDA and has been authorized for detection and/or diagnosis of SARS-CoV-2 by FDA under an Emergency Use Authorization (EUA).  This EUA will remain in effect (meaning this test can be used) for the duration of the COVID-19 declaration under Section 564(b)(1) of the Act, 21 U.S.C. section 360bbb-3(b)(1), unless the authorization is terminated or revoked sooner. Performed at [facility], [address] [place], [place] [address]   Blood Culture (routine x 2)     Status: Abnormal   Collection Time: 08/31/18 10:03 PM   Specimen: BLOOD RIGHT HAND  Result Value Ref Range Status   Specimen Description   Final    BLOOD RIGHT HAND Performed at [facility], [address] [place], [place] [address]    Special Requests   Final    BOTTLES DRAWN AEROBIC AND ANAEROBIC Blood Culture adequate volume Performed at [facility], [address], [place], [place] [address]    Culture  Setup Time   Final    GRAM POSITIVE COCCI IN BOTH AEROBIC AND ANAEROBIC BOTTLES Gram Stain Report Called to,Read Back By and Verified With: MINTER,R ON 09/01/18 AT 31 BY LOY,C PERFORMED AT [facility] CRITICAL RESULT CALLED TO, READ BACK BY AND VERIFIED WITH: C HOPKINS RN 09/01/18  2220 JDW    Culture (A)  Final    STAPHYLOCOCCUS SPECIES (COAGULASE NEGATIVE) THE SIGNIFICANCE OF ISOLATING THIS ORGANISM FROM A SINGLE SET OF BLOOD CULTURES WHEN MULTIPLE SETS ARE DRAWN IS UNCERTAIN. PLEASE NOTIFY THE MICROBIOLOGY DEPARTMENT WITHIN ONE WEEK IF SPECIATION AND SENSITIVITIES ARE REQUIRED. Performed at [facility], [place] [address], [place], [place] [address]    Report Status 09/03/2018 FINAL  Final  Blood Culture ID Panel (Reflexed)     Status: Abnormal   Collection Time: 08/31/18 10:03 PM  Result Value Ref Range Status   Enterococcus species NOT DETECTED NOT DETECTED Final  Listeria monocytogenes NOT DETECTED NOT DETECTED Final   Staphylococcus species DETECTED (A) NOT DETECTED Final    Comment: Methicillin (oxacillin) resistant coagulase negative staphylococcus. Possible blood culture contaminant (unless isolated from more than one blood culture draw or clinical case suggests pathogenicity). No antibiotic treatment is indicated for blood  culture contaminants. CRITICAL RESULT CALLED TO, READ BACK BY AND VERIFIED WITH: C HOPKINS RN 09/01/18 2220 JDW    Staphylococcus aureus (BCID) NOT DETECTED NOT DETECTED Final   Methicillin resistance DETECTED (A) NOT DETECTED Final    Comment: CRITICAL RESULT CALLED TO, READ BACK BY AND VERIFIED WITH: C HOPKINS RN 09/01/18 2220 JDW    Streptococcus species NOT DETECTED NOT DETECTED Final   Streptococcus agalactiae NOT DETECTED NOT DETECTED Final   Streptococcus pneumoniae NOT DETECTED NOT DETECTED Final   Streptococcus pyogenes NOT DETECTED NOT DETECTED Final   Acinetobacter baumannii NOT DETECTED NOT DETECTED Final   Enterobacteriaceae species NOT DETECTED NOT DETECTED Final   Enterobacter cloacae complex NOT DETECTED NOT DETECTED Final   Escherichia coli NOT DETECTED NOT DETECTED Final   Klebsiella oxytoca NOT DETECTED NOT DETECTED Final   Klebsiella pneumoniae NOT DETECTED NOT DETECTED Final   Proteus species NOT DETECTED NOT  DETECTED Final   Serratia marcescens NOT DETECTED NOT DETECTED Final   Haemophilus influenzae NOT DETECTED NOT DETECTED Final   Neisseria meningitidis NOT DETECTED NOT DETECTED Final   Pseudomonas aeruginosa NOT DETECTED NOT DETECTED Final   Candida albicans NOT DETECTED NOT DETECTED Final   Candida glabrata NOT DETECTED NOT DETECTED Final   Candida krusei NOT DETECTED NOT DETECTED Final   Candida parapsilosis NOT DETECTED NOT DETECTED Final   Candida tropicalis NOT DETECTED NOT DETECTED Final    Comment: Performed at [facility], [place] [address], [place], [place] [address]  Blood Culture (routine x 2)     Status: None   Collection Time: 08/31/18 10:04 PM   Specimen: BLOOD RIGHT ARM  Result Value Ref Range Status   Specimen Description BLOOD RIGHT ARM  Final   Special Requests   Final    BOTTLES DRAWN AEROBIC AND ANAEROBIC Blood Culture adequate volume   Culture   Final    NO GROWTH 5 DAYS Performed at [facility], [address], [place], [place] [address]    Report Status 09/05/2018 FINAL  Final  Culture, respiratory     Status: None   Collection Time: 09/01/18 11:27 AM  Result Value Ref Range Status   Specimen Description   Final    EXPECTORATED SPUTUM Performed at [facility], [address], [place], [place] [address]    Special Requests   Final    NONE Performed at [facility], [address] [place], [place] [address]    Gram Stain   Final    FEW WBC PRESENT,BOTH PMN AND MONONUCLEAR FEW GRAM POSITIVE COCCI FEW GRAM VARIABLE ROD ABUNDANT YEAST ABUNDANT SQUAMOUS EPITHELIAL CELLS PRESENT    Culture   Final    Consistent with normal respiratory flora. Performed at [facility], [place] [address], [place], [place] [address]    Report Status 09/03/2018 FINAL  Final  MRSA PCR Screening     Status: None   Collection Time: 09/01/18  8:39 PM   Specimen: Nasal Mucosa; Nasopharyngeal  Result Value Ref Range Status   MRSA by PCR NEGATIVE NEGATIVE Final     Comment:        The GeneXpert MRSA Assay (FDA approved for NASAL specimens only), is  one component of a comprehensive MRSA colonization surveillance program. It is not intended to diagnose MRSA infection nor to guide or monitor treatment for MRSA infections. Performed at [facility], [address] [place], [place] [address]      Scheduled Meds: . amLODipine  5 mg Oral Daily  . aspirin EC  81 mg Oral Daily  . Chlorhexidine Gluconate Cloth  6 each Topical Q0600  . enoxaparin (LOVENOX) injection  40 mg Subcutaneous Q24H  . ferrous sulfate  325 mg Oral Daily  . fluticasone  2 spray Each Nare Daily  . fluticasone furoate-vilanterol  1 puff Inhalation Daily  . gabapentin  300 mg Oral TID  . ipratropium  0.5 mg Nebulization Q6H  . levalbuterol  1.25 mg Nebulization Q6H  . lisinopril  10 mg Oral Daily  . methylPREDNISolone (SOLU-MEDROL) injection  60 mg Intravenous Q6H  . metoCLOPramide  5 mg Oral TID AC & HS  . metoprolol succinate  25 mg Oral Daily  . mirabegron ER  50 mg Oral Daily  . montelukast  10 mg Oral Daily  . multivitamin with minerals  1 tablet Oral Daily  . nicotine  14 mg Transdermal Daily  . oxyCODONE  10 mg Oral Q4H  . pantoprazole  40 mg Oral Daily  . simvastatin  10 mg Oral QHS  . vitamin C  500 mg Oral Daily   Continuous Infusions: . sodium chloride Stopped (09/03/18 0532)  . fluconazole (DIFLUCAN) IV Stopped (09/07/18 1341)    Procedures/Studies: Dg Chest 2 View  Result Date: 08/25/2018 CLINICAL DATA:  Malignant neoplasm of left lung. EXAM: CHEST - 2 VIEW COMPARISON:  08/05/2018 and 06/22/2018 FINDINGS: Right subclavian Port-A-Cath is stable in the upper SVC region. Heart and mediastinum are stable. Again noted are prominent interstitial densities in the right lower chest without significantly change from the most recent comparison examination but progression since May 2020. Chronic parenchymal densities in left lung have not significantly changed. There  appears to be a left-sided pleural catheter, best seen on the lateral view. No large pleural effusions. IMPRESSION: 1. Stable chest radiograph findings. Minimal change from 08/05/2018. 2. Prominent interstitial densities in both lungs compatible with chronic changes. There may be a component of subacute disease in the right lung. Electronically Signed   By: Markus Daft M.D.   On: 08/25/2018 13:32   Dg Chest Port 1 View  Result Date: 09/04/2018 CLINICAL DATA:  Acute and chronic respiratory failure with hypoxia. Small-cell lung cancer. COPD. EXAM: PORTABLE CHEST 1 VIEW COMPARISON:  09/03/2018 FINDINGS: Right subclavian catheter tip at low SVC. Patient rotated right. Numerous leads and wires project over the chest. Mild cardiomegaly. Chronic costophrenic angle blunting may be slightly improved. No pneumothorax. Diffuse marked interstitial opacities throughout both lungs. Possible somewhat more confluent opacity in the right lower lobe laterally, not significantly changed. IMPRESSION: No significant change since 1 day prior. Diffuse interstitial prominence is likely related to COPD/chronic bronchitis. Superimposed pulmonary edema or atypical infection cannot be excluded. Suspect somewhat more confluent right lower lobe airspace disease, similar. This may represent an area of lobar pneumonia. Electronically Signed   By: Abigail Miyamoto M.D.   On: 09/04/2018 09:45   Dg Chest Port 1 View  Result Date: 09/03/2018 CLINICAL DATA:  Pneumonia. EXAM: PORTABLE CHEST 1 VIEW COMPARISON:  Three days ago FINDINGS: Coarse interstitial opacities with mildly low lung volumes. 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 There is history of pneumonia which could be easily obscured. Chronic costophrenic sulcus blunting.  No pneumothorax. Right subclavian porta catheter in good position. IMPRESSION: Stable from 3 days ago. Chronic fibrotic and emphysematous lung disease with possible superimposed pneumonia Electronically Signed   By: Monte Fantasia M.D.   On: 09/03/2018 05:59   Dg Chest Portable 1 View  Result Date: 08/31/2018 CLINICAL DATA:  Shortness of breath for 2 days EXAM: PORTABLE CHEST 1 VIEW COMPARISON:  08/25/2018 FINDINGS: Cardiac shadow is stable. Right chest wall port is again seen. Diffuse fibrotic changes are again identified throughout both lungs. Some increased infiltrate in the left upper lobe is noted new from the prior exam. No sizable acute effusion is seen. No bony abnormality is noted. IMPRESSION: Acute on chronic infiltrate in the left upper lobe. Diffuse fibrotic changes are again identified. Electronically Signed   By: Inez Catalina M.D.   On: 08/31/2018 22:15    Orson Eva, DO  [facility] Pager [phone]  If 7PM-7AM, please contact night-coverage [email or web address] Password [facility] 09/08/2018, 9:56 AM   LOS: 7 days

## 2018-09-08 NOTE — Care Management (Signed)
Patient Information  Patient Name  Donnivan, Villena ([id number]) Sex  Male DOB  09/14/1940  Room Bed  IC10 IC10-01  Patient Demographics  Address  [place]  [place] [address] Phone  [phone] (Home)  [phone] (Mobile) *Preferred*  Patient Ethnicity & Race  Ethnic Group Patient Race  Not Hispanic or Latino White or Caucasian  Emergency Contact(s)  Name Relation Home Work Mobile  [place] Daughter   [phone]  Documents on File   Status Date Received Description  Documents for the Patient  [place] Not Received    [place] E-Signature HIPAA Notice of Privacy     Driver's License Received 87/56/43 exp 11/07/2020  Insurance Card Not Received  Medicare  Advance Directives/Living Will/HCPOA/POA Not Received    Other Photo ID Not Received    [place] E-Signature HIPAA Notice of Privacy Signed 03/05/16 [facility] ED 02/2016  [facility] E-Signature HIPAA Notice of Privacy Spanish     Release of Information Received 08/10/17 DPR CHMGE WIDE 2019  Advanced Beneficiary Notice (ABN) Not Received    E-Signature AOB [place] Not Received    Insurance Card Received 08/10/17 New Medicare Card 2019  Insurance Card Received 08/10/17 Aetna Medicare Card 2019  HIM ROI Authorization (Expired) 08/18/17 Authorization for batch AETNA/EPISource Medicare 2019 Chart Review  Insurance Card Received 09/14/17 [facility] Medicare 2019  DNR (Do Not Resuscitate) Documentation Received 01/04/18   HIM ROI Authorization  01/04/18 CONTINUITY OF CARE  Insurance Card Received 01/29/18 [facility] MEDICARE 2020  Insurance Card Received 04/02/18   ACP Flag Received 01/04/18   HIM ROI Authorization  06/29/18   AMB HH/NH/Hospice Received 07/09/18 ORDERS [facility]  MOST Received 07/20/18   Advance Directives Received 07/20/18   Insurance Card Received 07/23/18 315 rec 07/23/18  [place] HIPAA NOTICE OF PRIVACY - Scanned Received 07/23/18 315 rec 07/23/18   [place] E-Signature HIPAA Notice of Privacy Collected on Related Encounter 07/26/18   Release of Information Received 07/28/18 DPR TCTS 2020  MOST Received 08/01/18   HIM ROI Authorization  08/03/18 TPO  AMB HH/NH/Hospice Received 08/04/18 ORDER P[facility]JOAC  [place] E-Signature HIPAA Notice of Privacy Collected on Related Encounter 08/24/18   AMB HH/NH/Hospice Received 09/07/18 ORDER [facility]  Patient Photo   Photo of Patient  HIM Release of Information Output (Deleted) 01/04/18 Requested records  HIM Release of Information Output (Deleted) 01/29/18 Requested records  HIM Release of Information Output (Deleted) 02/25/18 Requested records  HIM Release of Information Output  06/29/18 Requested records  HIM Release of Information Output  08/03/18 Requested records  Documents for the Encounter  AOB (Assignment of Insurance Benefits) Received 08/31/18   E-signature AOB     MEDICARE RIGHTS Received 08/31/18   E-signature Medicare Rights     ED Patient Billing Extract   ED PB Billing Extract  Admission Information  Current Information  Attending Provider Admitting Provider Admission Type Admission Status  Tat, Shanon Brow, MD Murlean Iba, MD Emergency Admission (Confirmed)       Admission Date/Time Discharge Date Hospital Service Auth/Cert Status  16/60/63 09:20 PM  [place] Unit Room/Bed   [facility] AP-ICCUP NURSING IC10/IC10-01        Admission  Complaint  Shortness of [facility] Account  Name Acct ID Class Status Primary Coverage  Brandt, Chaney [id number] [place]  Account (for Hospital Account [id number])  Name Relation to Pt Service Area Active? Acct Type  [name] Self CHSA Yes Personal/Family  Address Phone    [address]  [place], [place] [address] [phone])        Coverage Information (for Hospital Account  [id number])  F/O Payor/Plan Precert #  [facility] [place] #  Ade, Stmarie [id number]  [place] [address] [phone]       Care Everywhere ID:  [id number]

## 2018-09-09 ENCOUNTER — Ambulatory Visit (HOSPITAL_COMMUNITY): Payer: Medicare Other

## 2018-09-09 ENCOUNTER — Ambulatory Visit (HOSPITAL_COMMUNITY): Payer: Medicare Other | Admitting: Hematology

## 2018-09-09 DIAGNOSIS — Z720 Tobacco use: Secondary | ICD-10-CM

## 2018-09-09 LAB — MAGNESIUM: Magnesium: 2.4 mg/dL (ref 1.7–2.4)

## 2018-09-09 LAB — BASIC METABOLIC PANEL
Anion gap: 10 (ref 5–15)
BUN: 33 mg/dL — ABNORMAL HIGH (ref 8–23)
CO2: 30 mmol/L (ref 22–32)
Calcium: 8.7 mg/dL — ABNORMAL LOW (ref 8.9–10.3)
Chloride: 98 mmol/L (ref 98–111)
Creatinine, Ser: 0.66 mg/dL (ref 0.61–1.24)
GFR calc Af Amer: >60 mL/min (ref >60)
GFR calc non Af Amer: >60 mL/min (ref >60)
Glucose, Bld: 131 mg/dL — ABNORMAL HIGH (ref 70–99)
Potassium: 4.1 mmol/L (ref 3.5–5.1)
Sodium: 138 mmol/L (ref 135–145)

## 2018-09-09 LAB — PHOSPHORUS: Phosphorus: 2.8 mg/dL (ref 2.5–4.6)

## 2018-09-09 MED ORDER — CYCLOBENZAPRINE HCL 5 MG PO TABS: 5.0000 mg | ORAL_TABLET | Freq: Three times a day (TID) | ORAL | 0 refills | Status: AC

## 2018-09-09 MED ORDER — METHYLPREDNISOLONE SODIUM SUCC 40 MG IJ SOLR
40.0000 mg | Freq: Three times a day (TID) | INTRAMUSCULAR | Status: DC
  Administered 2018-09-09: 40 mg via INTRAVENOUS
  Filled 2018-09-09: qty 1

## 2018-09-09 MED ORDER — OXYCODONE HCL 10 MG PO TABS: 10.0000 mg | ORAL_TABLET | ORAL | 0 refills | Status: AC

## 2018-09-09 MED ORDER — GABAPENTIN 300 MG PO CAPS: 300.0000 mg | ORAL_CAPSULE | Freq: Three times a day (TID) | ORAL | 0 refills | Status: AC

## 2018-09-09 NOTE — Progress Notes (Signed)
Subjective: This morning he is awake still somewhat confused and says he is hungry.  He remains on high flow nasal cannula but is down to 4 L  Objective: Vital signs in last 24 hours: Temp:  [97.4 F (36.3 C)-98 F (36.7 C)] 98 F (36.7 C) (08/13 0400) Pulse Rate:  [72-93] 89 (08/13 0700) Resp:  [8-28] 28 (08/13 0700) BP: (132-180)/(72-124) 169/92 (08/13 0700) SpO2:  [89 %-100 %] 95 % (08/13 0700) Weight:  [64.7 kg] 64.7 kg (08/13 0500) Weight change: -0.8 kg Last BM Date: 08/30/18  Intake/Output from previous day: 08/12 0701 - 08/13 0700 In: -  Out: 1450 [Urine:1450]  PHYSICAL EXAM General appearance: alert and I think less confused Resp: rales bilaterally and rhonchi bilaterally Cardio: regular rate and rhythm, S1, S2 normal, no murmur, click, rub or gallop GI: soft, non-tender; bowel sounds normal; no masses,  no organomegaly Extremities: extremities normal, atraumatic, no cyanosis or edema  Lab Results:  Results for orders placed or performed during the hospital encounter of 08/31/18 (from the past 48 hour(s))  Creatinine, serum     Status: None   Collection Time: 09/08/18  6:00 AM  Result Value Ref Range   Creatinine, Ser 0.71 0.61 - 1.24 mg/dL   GFR calc non Af Amer >60 >60 mL/min   GFR calc Af Amer >60 >60 mL/min    Comment: Performed at [facility], [address] [place], [place] [address]  SARS CORONAVIRUS 2 Nasal Swab Aptima Multi Swab     Status: None   Collection Time: 09/08/18  3:50 PM   Specimen: Aptima Multi Swab; Nasal Swab  Result Value Ref Range   SARS Coronavirus 2 NEGATIVE NEGATIVE    Comment: (NOTE) SARS-CoV-2 target nucleic acids are NOT DETECTED. The SARS-CoV-2 RNA is generally detectable in upper and lower respiratory specimens during the acute phase of infection. Negative results do not preclude SARS-CoV-2 infection, do not rule out co-infections with other pathogens, and should not be used as the sole basis for treatment or other patient  management decisions. Negative results must be combined with clinical observations, patient history, and epidemiological information. The expected result is Negative. Fact Sheet for Patients: SugarRoll.be Fact Sheet for Healthcare Providers: https://www.woods-mathews.com/ This test is not yet approved or cleared by the [place] FDA and  has been authorized for detection and/or diagnosis of SARS-CoV-2 by FDA under an Emergency Use Authorization (EUA). This EUA will remain  in effect (meaning this test can be used) for the duration of the COVID-19 declaration under Section 56 4(b)(1) of the Act, 21 U.S.C. section 360bbb-3(b)(1), unless the authorization is terminated or revoked sooner. Performed at [facility], [place] [address] [place], [place] [address]   Basic metabolic panel     Status: Abnormal   Collection Time: 09/09/18  4:13 AM  Result Value Ref Range   Sodium 138 135 - 145 mmol/L   Potassium 4.1 3.5 - 5.1 mmol/L   Chloride 98 98 - 111 mmol/L   CO2 30 22 - 32 mmol/L   Glucose, Bld 131 (H) 70 - 99 mg/dL   BUN 33 (H) 8 - 23 mg/dL   Creatinine, Ser 0.66 0.61 - 1.24 mg/dL   Calcium 8.7 (L) 8.9 - 10.3 mg/dL   GFR calc non Af Amer >60 >60 mL/min   GFR calc Af Amer >60 >60 mL/min   Anion gap 10 5 - 15    Comment: Performed at [facility], [address] [place], [place] [address]  Magnesium     Status: None   Collection Time: 09/09/18  4:13 AM  Result Value Ref Range   Magnesium 2.4 1.7 - 2.4 mg/dL    Comment: Performed at [facility], [address] [place], [place] [address]  Phosphorus     Status: None   Collection Time: 09/09/18  4:13 AM  Result Value Ref Range   Phosphorus 2.8 2.5 - 4.6 mg/dL    Comment: Performed at [facility], [address], [place], [place] [address]    ABGS No results for input(s): PHART, PO2ART, TCO2, HCO3 in the last 72 hours.  Invalid input(s): PCO2 CULTURES Recent Results (from the  past 240 hour(s))  Urine culture     Status: None   Collection Time: 08/31/18  9:45 PM   Specimen: In/Out Cath Urine  Result Value Ref Range Status   Specimen Description   Final    IN/OUT CATH URINE Performed at [facility], [address], [place], [place] [address]    Special Requests   Final    NONE Performed at [facility], [address], [place], [place] [address]    Culture   Final    NO GROWTH Performed at [facility], [place] [address], [place], [place] [address]    Report Status 09/02/2018 FINAL  Final  SARS Coronavirus 2 [facility] order, Performed in [facility] hospital lab) Nasopharyngeal Nasopharyngeal Swab     Status: None   Collection Time: 08/31/18  9:46 PM   Specimen: Nasopharyngeal Swab  Result Value Ref Range Status   SARS Coronavirus 2 NEGATIVE NEGATIVE Final    Comment: (NOTE) If result is NEGATIVE SARS-CoV-2 target nucleic acids are NOT DETECTED. The SARS-CoV-2 RNA is generally detectable in upper and lower  respiratory specimens during the acute phase of infection. The lowest  concentration of SARS-CoV-2 viral copies this assay can detect is 250  copies / mL. A negative result does not preclude SARS-CoV-2 infection  and should not be used as the sole basis for treatment or other  patient management decisions.  A negative result may occur with  improper specimen collection / handling, submission of specimen other  than nasopharyngeal swab, presence of viral mutation(s) within the  areas targeted by this assay, and inadequate number of viral copies  (<250 copies / mL). A negative result must be combined with clinical  observations, patient history, and epidemiological information. If result is POSITIVE SARS-CoV-2 target nucleic acids are DETECTED. The SARS-CoV-2 RNA is generally detectable in upper and lower  respiratory specimens dur ing the acute phase of infection.  Positive  results are indicative of active infection with SARS-CoV-2.  Clinical   correlation with patient history and other diagnostic information is  necessary to determine patient infection status.  Positive results do  not rule out bacterial infection or co-infection with other viruses. If result is PRESUMPTIVE POSTIVE SARS-CoV-2 nucleic acids MAY BE PRESENT.   A presumptive positive result was obtained on the submitted specimen  and confirmed on repeat testing.  While 2019 novel coronavirus  (SARS-CoV-2) nucleic acids may be present in the submitted sample  additional confirmatory testing may be necessary for epidemiological  and / or clinical management purposes  to differentiate between  SARS-CoV-2 and other Sarbecovirus currently known to infect humans.  If clinically indicated additional testing with an alternate test  methodology [phone]) is advised. The SARS-CoV-2 RNA is generally  detectable in upper and lower respiratory sp ecimens during the acute  phase of infection.  The expected result is Negative. Fact Sheet for Patients:  StrictlyIdeas.no Fact Sheet for Healthcare Providers: BankingDealers.co.za This test is not yet approved or cleared by the [place] FDA and has been authorized for detection and/or diagnosis of SARS-CoV-2 by FDA under an Emergency Use Authorization (EUA).  This EUA will remain in effect (meaning this test can be used) for the duration of the COVID-19 declaration under Section 564(b)(1) of the Act, 21 U.S.C. section 360bbb-3(b)(1), unless the authorization is terminated or revoked sooner. Performed at [facility], [address] [place], [place] [address]   Blood Culture (routine x 2)     Status: Abnormal   Collection Time: 08/31/18 10:03 PM   Specimen: BLOOD RIGHT HAND  Result Value Ref Range Status   Specimen Description   Final    BLOOD RIGHT HAND Performed at [facility], [address] [place], [place] [address]    Special Requests   Final    BOTTLES DRAWN AEROBIC  AND ANAEROBIC Blood Culture adequate volume Performed at [facility], [address], [place], [place] [address]    Culture  Setup Time   Final    GRAM POSITIVE COCCI IN BOTH AEROBIC AND ANAEROBIC BOTTLES Gram Stain Report Called to,Read Back By and Verified With: MINTER,R ON 09/01/18 AT 6 BY LOY,C PERFORMED AT [facility] CRITICAL RESULT CALLED TO, READ BACK BY AND VERIFIED WITH: C HOPKINS RN 09/01/18 2220 JDW    Culture (A)  Final    STAPHYLOCOCCUS SPECIES (COAGULASE NEGATIVE) THE SIGNIFICANCE OF ISOLATING THIS ORGANISM FROM A SINGLE SET OF BLOOD CULTURES WHEN MULTIPLE SETS ARE DRAWN IS UNCERTAIN. PLEASE NOTIFY THE MICROBIOLOGY DEPARTMENT WITHIN ONE WEEK IF SPECIATION AND SENSITIVITIES ARE REQUIRED. Performed at [facility], [place] [address], [place], [place] [address]    Report Status 09/03/2018 FINAL  Final  Blood Culture ID Panel (Reflexed)     Status: Abnormal   Collection Time: 08/31/18 10:03 PM  Result Value Ref Range Status   Enterococcus species NOT DETECTED NOT DETECTED Final   Listeria monocytogenes NOT DETECTED NOT DETECTED Final   Staphylococcus species DETECTED (A) NOT DETECTED Final    Comment: Methicillin (oxacillin) resistant coagulase negative staphylococcus. Possible blood culture contaminant (unless isolated from more than one blood culture draw or clinical case suggests pathogenicity). No antibiotic treatment is indicated for blood  culture contaminants. CRITICAL RESULT CALLED TO, READ BACK BY AND VERIFIED WITH: C HOPKINS RN 09/01/18 2220 JDW    Staphylococcus aureus (BCID) NOT DETECTED NOT DETECTED Final   Methicillin resistance DETECTED (A) NOT DETECTED Final    Comment: CRITICAL RESULT CALLED TO, READ BACK BY AND VERIFIED WITH: C HOPKINS RN 09/01/18 2220 JDW    Streptococcus species NOT DETECTED NOT DETECTED Final   Streptococcus agalactiae NOT DETECTED NOT DETECTED Final   Streptococcus pneumoniae NOT DETECTED NOT DETECTED Final   Streptococcus pyogenes NOT  DETECTED NOT DETECTED Final   Acinetobacter baumannii NOT DETECTED NOT DETECTED Final   Enterobacteriaceae species NOT DETECTED NOT DETECTED Final   Enterobacter cloacae complex NOT DETECTED NOT DETECTED Final   Escherichia coli NOT DETECTED NOT DETECTED Final   Klebsiella oxytoca NOT DETECTED NOT DETECTED Final   Klebsiella pneumoniae NOT DETECTED NOT DETECTED Final   Proteus species NOT DETECTED NOT DETECTED Final   Serratia marcescens NOT DETECTED NOT DETECTED Final   Haemophilus influenzae NOT DETECTED NOT DETECTED Final   Neisseria meningitidis NOT DETECTED NOT DETECTED Final   Pseudomonas aeruginosa NOT DETECTED NOT DETECTED Final   Candida  albicans NOT DETECTED NOT DETECTED Final   Candida glabrata NOT DETECTED NOT DETECTED Final   Candida krusei NOT DETECTED NOT DETECTED Final   Candida parapsilosis NOT DETECTED NOT DETECTED Final   Candida tropicalis NOT DETECTED NOT DETECTED Final    Comment: Performed at [facility], [place] [address], [place], [place] [address]  Blood Culture (routine x 2)     Status: None   Collection Time: 08/31/18 10:04 PM   Specimen: BLOOD RIGHT ARM  Result Value Ref Range Status   Specimen Description BLOOD RIGHT ARM  Final   Special Requests   Final    BOTTLES DRAWN AEROBIC AND ANAEROBIC Blood Culture adequate volume   Culture   Final    NO GROWTH 5 DAYS Performed at [facility], [address], [place], [place] [address]    Report Status 09/05/2018 FINAL  Final  Culture, respiratory     Status: None   Collection Time: 09/01/18 11:27 AM  Result Value Ref Range Status   Specimen Description   Final    EXPECTORATED SPUTUM Performed at [facility], [address], [place], [place] [address]    Special Requests   Final    NONE Performed at [facility], [address], [place], [place] [address]    Gram Stain   Final    FEW WBC PRESENT,BOTH PMN AND MONONUCLEAR FEW GRAM POSITIVE COCCI FEW GRAM VARIABLE ROD ABUNDANT YEAST ABUNDANT SQUAMOUS  EPITHELIAL CELLS PRESENT    Culture   Final    Consistent with normal respiratory flora. Performed at [facility], [place] [address] [place], [place] [address]    Report Status 09/03/2018 FINAL  Final  MRSA PCR Screening     Status: None   Collection Time: 09/01/18  8:39 PM   Specimen: Nasal Mucosa; Nasopharyngeal  Result Value Ref Range Status   MRSA by PCR NEGATIVE NEGATIVE Final    Comment:        The GeneXpert MRSA Assay (FDA approved for NASAL specimens only), is one component of a comprehensive MRSA colonization surveillance program. It is not intended to diagnose MRSA infection nor to guide or monitor treatment for MRSA infections. Performed at [facility], [address] [place], [place] [address]   SARS CORONAVIRUS 2 Nasal Swab Aptima Multi Swab     Status: None   Collection Time: 09/08/18  3:50 PM   Specimen: Aptima Multi Swab; Nasal Swab  Result Value Ref Range Status   SARS Coronavirus 2 NEGATIVE NEGATIVE Final    Comment: (NOTE) SARS-CoV-2 target nucleic acids are NOT DETECTED. The SARS-CoV-2 RNA is generally detectable in upper and lower respiratory specimens during the acute phase of infection. Negative results do not preclude SARS-CoV-2 infection, do not rule out co-infections with other pathogens, and should not be used as the sole basis for treatment or other patient management decisions. Negative results must be combined with clinical observations, patient history, and epidemiological information. The expected result is Negative. Fact Sheet for Patients: SugarRoll.be Fact Sheet for Healthcare Providers: https://www.woods-mathews.com/ This test is not yet approved or cleared by the [place] FDA and  has been authorized for detection and/or diagnosis of SARS-CoV-2 by FDA under an Emergency Use Authorization (EUA). This EUA will remain  in effect (meaning this test can be used) for the duration of  the COVID-19 declaration under Section 56 4(b)(1) of the Act, 21 U.S.C. section 360bbb-3(b)(1), unless the authorization is terminated or revoked sooner. Performed at [facility],  [address] [place], [place] [address]    Studies/Results: No results found.  Medications:  Prior to Admission:  Medications Prior to Admission  Medication Sig Dispense Refill Last Dose  . ADVAIR HFA 115-21 MCG/ACT inhaler Inhale 2 puffs into the lungs 2 (two) times daily. 1 Inhaler 4 08/31/2018 at Unknown time  . amLODipine (NORVASC) 5 MG tablet TAKE 1 TABLET BY MOUTH EVERY DAY (Patient taking differently: Take 5 mg by mouth daily. ) 90 tablet 1 08/31/2018 at Unknown time  . Ascorbic Acid (VITAMIN C) 500 MG CAPS Take 500 mg by mouth daily.   08/31/2018 at Unknown time  . aspirin EC 81 MG tablet Take 81 mg by mouth daily.    08/31/2018 at Unknown time  . cyclobenzaprine (FLEXERIL) 5 MG tablet Take 5 mg by mouth 3 (three) times daily.    08/31/2018 at Unknown time  . Ferrous Sulfate (IRON) 325 (65 Fe) MG TABS Take 1 tablet by mouth at bedtime.    08/31/2018 at Unknown time  . fluticasone (FLONASE) 50 MCG/ACT nasal spray Place 2 sprays into both nostrils daily.  6 08/31/2018 at Unknown time  . furosemide (LASIX) 20 MG tablet Take 1 tablet (20 mg total) by mouth daily. 10 tablet 0 08/31/2018 at Unknown time  . gabapentin (NEURONTIN) 300 MG capsule Take 1 capsule by mouth 3 (three) times daily.  2 08/31/2018 at Unknown time  . ipratropium-albuterol (DUONEB) 0.5-2.5 (3) MG/3ML SOLN Take 3 mLs by nebulization every 6 (six) hours as needed (for shortness of breath).   08/31/2018 at Unknown time  . lidocaine-prilocaine (EMLA) cream Apply 1 application topically as needed. Apply a small amount to port site and cover with plastic wrap 1 hour prior to infusion appointments 30 g 3 Past Week at Unknown time  . lisinopril (ZESTRIL) 20 MG tablet Take 1 tablet (20 mg total) by mouth daily.   08/31/2018 at Unknown time  . metoCLOPramide  (REGLAN) 5 MG tablet Take 1 tablet by mouth 4 (four) times daily -  before meals and at bedtime. Daily as needed  5 Past Month at Unknown time  . metoprolol succinate (TOPROL-XL) 25 MG 24 hr tablet Take 25 mg by mouth daily.   08/31/2018 at 0800  . Multiple Vitamins-Minerals (MULTIVITAMIN WITH MINERALS) tablet Take 1 tablet by mouth daily.   08/31/2018 at Unknown time  . MYRBETRIQ 50 MG TB24 tablet Take 1 tablet by mouth daily.   08/31/2018 at Unknown time  . nitroGLYCERIN (NITROSTAT) 0.4 MG SL tablet Place 0.4 mg under the tongue every 5 (five) minutes as needed for chest pain.   unknown  . Nivolumab (OPDIVO IV) Inject into the vein every 14 (fourteen) days.   Past Month at Unknown time  . Oxycodone HCl 10 MG TABS Take 1 tablet by mouth every 4 (four) hours.    08/31/2018 at Unknown time  . potassium gluconate (HM POTASSIUM) 595 (99 K) MG TABS tablet Take 595 mg by mouth daily.   08/31/2018 at Unknown time  . PRILOSEC 40 MG capsule Take 1 capsule by mouth daily.   08/31/2018 at Unknown time  . simvastatin (ZOCOR) 10 MG tablet Take 10 mg by mouth at bedtime.   3 08/31/2018 at Unknown time  . VENTOLIN HFA 108 (90 Base) MCG/ACT inhaler Inhale 2 puffs into the lungs every 4 (four) hours as needed for wheezing or shortness of breath. 18 g 5 Past Month at Unknown time  . Ipilimumab (YERVOY IV) Inject into the vein every  6 (six) weeks.   Unknown at Unknown time  . polyethylene glycol (MIRALAX / GLYCOLAX) 17 g packet Take 17 g by mouth daily as needed for mild constipation. 14 each 0 Unknown at Unknown time   Scheduled: . amLODipine  5 mg Oral Daily  . aspirin EC  81 mg Oral Daily  . Chlorhexidine Gluconate Cloth  6 each Topical Q0600  . enoxaparin (LOVENOX) injection  40 mg Subcutaneous Q24H  . ferrous sulfate  325 mg Oral Daily  . fluticasone  2 spray Each Nare Daily  . fluticasone furoate-vilanterol  1 puff Inhalation Daily  . gabapentin  300 mg Oral TID  . ipratropium  0.5 mg Nebulization Q6H WA  .  levalbuterol  1.25 mg Nebulization Q6H WA  . lisinopril  10 mg Oral Daily  . methylPREDNISolone (SOLU-MEDROL) injection  60 mg Intravenous Q6H  . metoCLOPramide  5 mg Oral TID AC & HS  . metoprolol succinate  25 mg Oral Daily  . mirabegron ER  50 mg Oral Daily  . montelukast  10 mg Oral Daily  . multivitamin with minerals  1 tablet Oral Daily  . nicotine  14 mg Transdermal Daily  . oxyCODONE  10 mg Oral Q4H  . pantoprazole  40 mg Oral Daily  . simvastatin  10 mg Oral QHS  . vitamin C  500 mg Oral Daily   Continuous: . sodium chloride Stopped (09/03/18 0532)   BBC:WUGQBV chloride, albuterol, ipratropium-albuterol, LORazepam, morphine injection, polyethylene glycol  Assesment: He was admitted with acute on chronic hypoxic respiratory failure.  He initially improved then had increased shortness of breath increased air hunger and marked hypoxia.  He was transferred to stepdown started on IV steroids and initially placed on BiPAP.  He has not been on BiPAP for about 72 hours.  He is still on high flow nasal cannula but down to 4 L now.  At baseline he has pulmonary fibrosis.  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  Per palliative care note plans are for him to transition to hospice Active Problems:   Tobacco abuse   COPD (chronic obstructive pulmonary disease) ([place])   Essential hypertension   Abnormal chest x-ray   Mass of lower lobe of left lung   Non-small cell carcinoma of left lung ([facility])   Malignant neoplasm of left lung ([place])   HCAP (healthcare-associated pneumonia)   Acute and chronic respiratory failure with hypoxia ([place])   Sepsis due to undetermined organism [facility])    Plan: Considering transition to hospice I will plan to sign off.  Thanks for allowing me to see him with you    LOS: 8 days   Alonza Bogus 09/09/2018, 7:42 AM

## 2018-09-09 NOTE — Progress Notes (Signed)
PROGRESS NOTE    Bradley [name]  OIN:[id number] DOB: [birth year] DOA: 08/31/2018 PCP: Jani Gravel, MD   Brief Narrative:  78 year old male with a history of non-small cell lung cancer, COPD, coronary disease, hyperlipidemia, hypertension presenting on 08/31/2018 secondary to shortness of breath.  Apparently, the patient was having some shortness of breath and did not look good per his daughter while eating dinner on 08/31/2018.  EMS was activated.  At baseline, the patient is supposed to be on 4 L nasal cannula at home.  Upon arrival, the patient was noted to have oxygen saturation of 77% in the emergency department.  He required 7 L to maintain oxygen saturations above 90%.  Upon presentation, the patient was started on vancomycin and cefepime.  Vancomycin has been since discontinued.  The patient continued to have slow clinical course.  He required placement on BiPAP.  Pulmonary medicine was consulted to assist with management.  The patient was started on IV steroids.  Palliative medicine was consulted to discuss goals of care.  The patient was subsequently weaned off of BiPAP on 09/04/2018.  8/13: Patient is currently on 4 L nasal cannula and continues to have intermittent dyspnea at rest.  He is very weak and deconditioned and at high risk for respiratory decompensation which would lead to death.  He is also starting to become more confused.  He appears to be appropriate for hospice facility as he has overall prognosis less than 6 weeks.   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)   Malignant neoplasm of left lung (HCC)   HCAP (healthcare-associated pneumonia)   Acute and chronic respiratory failure with hypoxia (HCC)   Sepsis due to undetermined organism ([place])   Acute on chronic hypoxemic respiratory  failure secondary to pneumonia and COPD exacerbation -This is in setting of COPD and pulmonary fibrosis; pulmonology has signed off today with plans for hospice on discharge -Patient appears to be on 4 L nasal cannula at home and is currently on a similar amount, but still has intermittent dyspnea and is a high risk for respiratory decompensation and is a strong candidate for hospice facility given prognosis of less than 6 weeks. -Continue IV Solu-Medrol at lower dose today as well as breathing treatments -Continue Breo  Sepsis secondary to healthcare associated pneumonia -This has currently resolved -Blood culture showed contaminant -Finish 7 days of cefepime on 8/11  Non-small cell lung cancer on immunotherapy -Follows with Dr. Delton Coombes, but is no longer a candidate for further treatment given poor prognosis -Left-sided Pleurx catheter removed on 08/25/2018  Essential hypertension-stable -Continue amlodipine, lisinopril, and metoprolol succinate  Dyslipidemia -Continue statin  Chronic neoplastic associated pain -Continue home oxycodone as needed for pain -Continue gabapentin  History of tobacco abuse -Cessation counseling provided  Deconditioning associated with critical illness -Patient is not a candidate for further PT and will need hospice on discharge  DVT prophylaxis: Lovenox Code Status: DNR Family Communication: Palliative to discuss with daughter further, none currently at bedside Disposition Plan: Anticipate hopeful discharge to hospice facility in the next 24 hours if accepted.  Continue current treatment measures otherwise.   Consultants:   Pulmonology  Procedures:   As noted above  Antimicrobials:  Anti-infectives (From admission, onward)   Start     Dose/Rate Route Frequency  Ordered Stop   09/07/18 1600  ceFEPIme (MAXIPIME) 2 g in sodium chloride 0.9 % 100 mL IVPB     2 g 200 mL/hr over 30 Minutes Intravenous Every 8 hours 09/07/18 1035 09/08/18 0009    09/05/18 1115  fluconazole (DIFLUCAN) IVPB 200 mg     200 mg 100 mL/hr over 60 Minutes Intravenous Every 24 hours 09/05/18 1110 09/08/18 1132   09/02/18 1600  ceFEPIme (MAXIPIME) 2 g in sodium chloride 0.9 % 100 mL IVPB  Status:  Discontinued     2 g 200 mL/hr over 30 Minutes Intravenous Every 8 hours 09/02/18 1024 09/07/18 1035   09/01/18 2000  ceFEPIme (MAXIPIME) 2 g in sodium chloride 0.9 % 100 mL IVPB  Status:  Discontinued     2 g 200 mL/hr over 30 Minutes Intravenous Every 12 hours 09/01/18 1122 09/02/18 1024   09/01/18 1100  vancomycin (VANCOCIN) 500 mg in sodium chloride 0.9 % 100 mL IVPB  Status:  Discontinued     500 mg 100 mL/hr over 60 Minutes Intravenous Every 12 hours 08/31/18 2230 09/02/18 1022   09/01/18 0030  ceFEPIme (MAXIPIME) 1 g in sodium chloride 0.9 % 100 mL IVPB  Status:  Discontinued     1 g 200 mL/hr over 30 Minutes Intravenous Every 12 hours 09/01/18 0025 09/01/18 1122   08/31/18 2300  vancomycin (VANCOCIN) 1,500 mg in sodium chloride 0.9 % 500 mL IVPB     1,500 mg 250 mL/hr over 120 Minutes Intravenous  Once 08/31/18 2230 09/01/18 0209   08/31/18 2215  ceFEPIme (MAXIPIME) 1 g in sodium chloride 0.9 % 100 mL IVPB     1 g 200 mL/hr over 30 Minutes Intravenous  Once 08/31/18 2204 08/31/18 2232       Subjective: Patient seen and evaluated today with some worsening confusion noted and weakness.  His oral intake has remained poor.  He is currently on 4 L nasal cannula and has intermittent dyspnea.  Objective: Vitals:   09/09/18 0829 09/09/18 0900 09/09/18 1000 09/09/18 1100  BP:  (!) 161/91 (!) 161/88 (!) 152/88  Pulse:  93 95 85  Resp:  20 16   Temp:      TempSrc:      SpO2: 98% 99% 96% 97%  Weight:      Height:        Intake/Output Summary (Last 24 hours) at 09/09/2018 1200 Last data filed at 09/09/2018 0500 Gross per 24 hour  Intake -  Output 1450 ml  Net -1450 ml   Filed Weights   09/07/18 0404 09/08/18 0600 09/09/18 0500  Weight: 66.4 kg  65.5 kg 64.7 kg    Examination:  General exam: Appears confused Respiratory system: Clear to auscultation. Respiratory effort slightly increased.  Currently on 4 L nasal cannula.  Diminished breath sounds bilaterally. Cardiovascular system: S1 & S2 heard, RRR. No JVD, murmurs, rubs, gallops or clicks. No pedal edema. Gastrointestinal system: Abdomen is nondistended, soft and nontender. No organomegaly or masses felt. Normal bowel sounds heard. Central nervous system: Alert and awake Extremities: Symmetric 5 x 5 power. Skin: No rashes, lesions or ulcers Psychiatry: Cannot be assessed.    Data Reviewed: I have personally reviewed following labs and imaging studies  CBC: Recent Labs  Lab 09/03/18 0512 09/04/18 0442 09/07/18 0341  WBC 15.0* 10.9* 13.1*  NEUTROABS  --   --  11.0*  HGB 13.0 12.4* 13.0  HCT 41.3 39.0 41.8  MCV 90.0 88.8 90.7  PLT 529* 525* 541*  Basic Metabolic Panel: Recent Labs  Lab 09/03/18 0512 09/04/18 0442 09/07/18 0341 09/08/18 0600 09/09/18 0413  NA 131* 136 139  --  138  K 3.7 3.9 4.0  --  4.1  CL 99 102 99  --  98  CO2 23 24 32  --  30  GLUCOSE 101* 133* 147*  --  131*  BUN 15 26* 26*  --  33*  CREATININE 0.85 0.79 0.77 0.71 0.66  CALCIUM 8.5* 8.7* 8.7*  --  8.7*  MG  --   --  2.1  --  2.4  PHOS  --   --   --   --  2.8   GFR: Estimated Creatinine Clearance: 70.8 mL/min (by C-G formula based on SCr of 0.66 mg/dL). Liver Function Tests: Recent Labs  Lab 09/03/18 0512 09/07/18 0341  AST 14* 25  ALT 10 26  ALKPHOS 23* 18*  BILITOT 1.0 0.7  PROT 7.8 8.1  ALBUMIN 2.4* 2.6*   No results for input(s): LIPASE, AMYLASE in the last 168 hours. No results for input(s): AMMONIA in the last 168 hours. Coagulation Profile: No results for input(s): INR, PROTIME in the last 168 hours. Cardiac Enzymes: No results for input(s): CKTOTAL, CKMB, CKMBINDEX, TROPONINI in the last 168 hours. BNP (last 3 results) No results for input(s): PROBNP in the  last 8760 hours. HbA1C: No results for input(s): HGBA1C in the last 72 hours. CBG: No results for input(s): GLUCAP in the last 168 hours. Lipid Profile: No results for input(s): CHOL, HDL, LDLCALC, TRIG, CHOLHDL, LDLDIRECT in the last 72 hours. Thyroid Function Tests: No results for input(s): TSH, T4TOTAL, FREET4, T3FREE, THYROIDAB in the last 72 hours. Anemia Panel: No results for input(s): VITAMINB12, FOLATE, FERRITIN, TIBC, IRON, RETICCTPCT in the last 72 hours. Sepsis Labs: No results for input(s): PROCALCITON, LATICACIDVEN in the last 168 hours.  Recent Results (from the past 240 hour(s))  Urine culture     Status: None   Collection Time: 08/31/18  9:45 PM   Specimen: In/Out Cath Urine  Result Value Ref Range Status   Specimen Description   Final    IN/OUT CATH URINE Performed at [facility], [address], [place], [place] [address]    Special Requests   Final    NONE Performed at [facility], [address], [place], [place] [address]    Culture   Final    NO GROWTH Performed at [facility], [place] [address] [place], [place] [address]    Report Status 09/02/2018 FINAL  Final  SARS Coronavirus 2 [facility] order, Performed in [facility] hospital lab) Nasopharyngeal Nasopharyngeal Swab     Status: None   Collection Time: 08/31/18  9:46 PM   Specimen: Nasopharyngeal Swab  Result Value Ref Range Status   SARS Coronavirus 2 NEGATIVE NEGATIVE Final    Comment: (NOTE) If result is NEGATIVE SARS-CoV-2 target nucleic acids are NOT DETECTED. The SARS-CoV-2 RNA is generally detectable in upper and lower  respiratory specimens during the acute phase of infection. The lowest  concentration of SARS-CoV-2 viral copies this assay can detect is 250  copies / mL. A negative result does not preclude SARS-CoV-2 infection  and should not be used as the sole basis for treatment or other  patient management decisions.  A negative result may occur with  improper specimen  collection / handling, submission of specimen other  than nasopharyngeal swab, presence of viral mutation(s) within the  areas targeted by this assay, and inadequate  number of viral copies  (<250 copies / mL). A negative result must be combined with clinical  observations, patient history, and epidemiological information. If result is POSITIVE SARS-CoV-2 target nucleic acids are DETECTED. The SARS-CoV-2 RNA is generally detectable in upper and lower  respiratory specimens dur ing the acute phase of infection.  Positive  results are indicative of active infection with SARS-CoV-2.  Clinical  correlation with patient history and other diagnostic information is  necessary to determine patient infection status.  Positive results do  not rule out bacterial infection or co-infection with other viruses. If result is PRESUMPTIVE POSTIVE SARS-CoV-2 nucleic acids MAY BE PRESENT.   A presumptive positive result was obtained on the submitted specimen  and confirmed on repeat testing.  While 2019 novel coronavirus  (SARS-CoV-2) nucleic acids may be present in the submitted sample  additional confirmatory testing may be necessary for epidemiological  and / or clinical management purposes  to differentiate between  SARS-CoV-2 and other Sarbecovirus currently known to infect humans.  If clinically indicated additional testing with an alternate test  methodology [id number]) is advised. The SARS-CoV-2 RNA is generally  detectable in upper and lower respiratory sp ecimens during the acute  phase of infection. The expected result is Negative. Fact Sheet for Patients:  StrictlyIdeas.no Fact Sheet for Healthcare Providers: BankingDealers.co.za This test is not yet approved or cleared by the [place] FDA and has been authorized for detection and/or diagnosis of SARS-CoV-2 by FDA under an Emergency Use Authorization (EUA).  This EUA will remain in effect  (meaning this test can be used) for the duration of the COVID-19 declaration under Section 564(b)(1) of the Act, 21 U.S.C. section 360bbb-3(b)(1), unless the authorization is terminated or revoked sooner. Performed at [facility], [address] [place], [place] [address]   Blood Culture (routine x 2)     Status: Abnormal   Collection Time: 08/31/18 10:03 PM   Specimen: BLOOD RIGHT HAND  Result Value Ref Range Status   Specimen Description   Final    BLOOD RIGHT HAND Performed at [facility], [address] [place], [place] [address]    Special Requests   Final    BOTTLES DRAWN AEROBIC AND ANAEROBIC Blood Culture adequate volume Performed at [facility], [address] [place], [place] [address]    Culture  Setup Time   Final    GRAM POSITIVE COCCI IN BOTH AEROBIC AND ANAEROBIC BOTTLES Gram Stain Report Called to,Read Back By and Verified With: MINTER,R ON 09/01/18 AT 72 BY LOY,C PERFORMED AT [facility] CRITICAL RESULT CALLED TO, READ BACK BY AND VERIFIED WITH: C HOPKINS RN 09/01/18 2220 JDW    Culture (A)  Final    STAPHYLOCOCCUS SPECIES (COAGULASE NEGATIVE) THE SIGNIFICANCE OF ISOLATING THIS ORGANISM FROM A SINGLE SET OF BLOOD CULTURES WHEN MULTIPLE SETS ARE DRAWN IS UNCERTAIN. PLEASE NOTIFY THE MICROBIOLOGY DEPARTMENT WITHIN ONE WEEK IF SPECIATION AND SENSITIVITIES ARE REQUIRED. Performed at [facility], [place] [address] [place], [place] [address]    Report Status 09/03/2018 FINAL  Final  Blood Culture ID Panel (Reflexed)     Status: Abnormal   Collection Time: 08/31/18 10:03 PM  Result Value Ref Range Status   Enterococcus species NOT DETECTED NOT DETECTED Final   Listeria monocytogenes NOT DETECTED NOT DETECTED Final   Staphylococcus species DETECTED (A) NOT DETECTED Final    Comment: Methicillin (oxacillin) resistant coagulase negative staphylococcus. Possible blood culture contaminant (unless isolated from more than one blood culture draw or clinical  case suggests  pathogenicity). No antibiotic treatment is indicated for blood  culture contaminants. CRITICAL RESULT CALLED TO, READ BACK BY AND VERIFIED WITH: C HOPKINS RN 09/01/18 2220 JDW    Staphylococcus aureus (BCID) NOT DETECTED NOT DETECTED Final   Methicillin resistance DETECTED (A) NOT DETECTED Final    Comment: CRITICAL RESULT CALLED TO, READ BACK BY AND VERIFIED WITH: C HOPKINS RN 09/01/18 2220 JDW    Streptococcus species NOT DETECTED NOT DETECTED Final   Streptococcus agalactiae NOT DETECTED NOT DETECTED Final   Streptococcus pneumoniae NOT DETECTED NOT DETECTED Final   Streptococcus pyogenes NOT DETECTED NOT DETECTED Final   Acinetobacter baumannii NOT DETECTED NOT DETECTED Final   Enterobacteriaceae species NOT DETECTED NOT DETECTED Final   Enterobacter cloacae complex NOT DETECTED NOT DETECTED Final   Escherichia coli NOT DETECTED NOT DETECTED Final   Klebsiella oxytoca NOT DETECTED NOT DETECTED Final   Klebsiella pneumoniae NOT DETECTED NOT DETECTED Final   Proteus species NOT DETECTED NOT DETECTED Final   Serratia marcescens NOT DETECTED NOT DETECTED Final   Haemophilus influenzae NOT DETECTED NOT DETECTED Final   Neisseria meningitidis NOT DETECTED NOT DETECTED Final   Pseudomonas aeruginosa NOT DETECTED NOT DETECTED Final   Candida albicans NOT DETECTED NOT DETECTED Final   Candida glabrata NOT DETECTED NOT DETECTED Final   Candida krusei NOT DETECTED NOT DETECTED Final   Candida parapsilosis NOT DETECTED NOT DETECTED Final   Candida tropicalis NOT DETECTED NOT DETECTED Final    Comment: Performed at [facility], [place] [address] [place], [place] [address]  Blood Culture (routine x 2)     Status: None   Collection Time: 08/31/18 10:04 PM   Specimen: BLOOD RIGHT ARM  Result Value Ref Range Status   Specimen Description BLOOD RIGHT ARM  Final   Special Requests   Final    BOTTLES DRAWN AEROBIC AND ANAEROBIC Blood Culture adequate volume   Culture   Final    NO GROWTH 5  DAYS Performed at [facility], [address], [place], [place] [address]    Report Status 09/05/2018 FINAL  Final  Culture, respiratory     Status: None   Collection Time: 09/01/18 11:27 AM  Result Value Ref Range Status   Specimen Description   Final    EXPECTORATED SPUTUM Performed at [facility], [address], [place], [place] [address]    Special Requests   Final    NONE Performed at [facility], [address] [place], [place] [address]    Gram Stain   Final    FEW WBC PRESENT,BOTH PMN AND MONONUCLEAR FEW GRAM POSITIVE COCCI FEW GRAM VARIABLE ROD ABUNDANT YEAST ABUNDANT SQUAMOUS EPITHELIAL CELLS PRESENT    Culture   Final    Consistent with normal respiratory flora. Performed at [facility], [place] [address], [place], [place] [address]    Report Status 09/03/2018 FINAL  Final  MRSA PCR Screening     Status: None   Collection Time: 09/01/18  8:39 PM   Specimen: Nasal Mucosa; Nasopharyngeal  Result Value Ref Range Status   MRSA by PCR NEGATIVE NEGATIVE Final    Comment:        The GeneXpert MRSA Assay (FDA approved for NASAL specimens only), is one component of a comprehensive MRSA colonization surveillance program. It is not intended to diagnose MRSA infection nor to guide or monitor treatment for MRSA infections. Performed at [facility], [address], [place], [place] [address]   SARS CORONAVIRUS  2 Nasal Swab Aptima Multi Swab     Status: None   Collection Time: 09/08/18  3:50 PM   Specimen: Aptima Multi Swab; Nasal Swab  Result Value Ref Range Status   SARS Coronavirus 2 NEGATIVE NEGATIVE Final    Comment: (NOTE) SARS-CoV-2 target nucleic acids are NOT DETECTED. The SARS-CoV-2 RNA is generally detectable in upper and lower respiratory specimens during the acute phase of infection. Negative results do not preclude SARS-CoV-2 infection, do not rule out co-infections with other pathogens, and should not be used as the sole basis for treatment or  other patient management decisions. Negative results must be combined with clinical observations, patient history, and epidemiological information. The expected result is Negative. Fact Sheet for Patients: SugarRoll.be Fact Sheet for Healthcare Providers: https://www.woods-mathews.com/ This test is not yet approved or cleared by the [place] FDA and  has been authorized for detection and/or diagnosis of SARS-CoV-2 by FDA under an Emergency Use Authorization (EUA). This EUA will remain  in effect (meaning this test can be used) for the duration of the COVID-19 declaration under Section 56 4(b)(1) of the Act, 21 U.S.C. section 360bbb-3(b)(1), unless the authorization is terminated or revoked sooner. Performed at [facility], [place] [address], [place], [place] [address]          Radiology Studies: No results found.      Scheduled Meds: . amLODipine  5 mg Oral Daily  . aspirin EC  81 mg Oral Daily  . Chlorhexidine Gluconate Cloth  6 each Topical Q0600  . enoxaparin (LOVENOX) injection  40 mg Subcutaneous Q24H  . ferrous sulfate  325 mg Oral Daily  . fluticasone  2 spray Each Nare Daily  . fluticasone furoate-vilanterol  1 puff Inhalation Daily  . gabapentin  300 mg Oral TID  . ipratropium  0.5 mg Nebulization Q6H WA  . levalbuterol  1.25 mg Nebulization Q6H WA  . lisinopril  10 mg Oral Daily  . methylPREDNISolone (SOLU-MEDROL) injection  60 mg Intravenous Q6H  . metoCLOPramide  5 mg Oral TID AC & HS  . metoprolol succinate  25 mg Oral Daily  . mirabegron ER  50 mg Oral Daily  . montelukast  10 mg Oral Daily  . multivitamin with minerals  1 tablet Oral Daily  . nicotine  14 mg Transdermal Daily  . oxyCODONE  10 mg Oral Q4H  . pantoprazole  40 mg Oral Daily  . simvastatin  10 mg Oral QHS  . vitamin C  500 mg Oral Daily   Continuous Infusions: . sodium chloride Stopped (09/03/18 0532)     LOS: 8 days    Time  spent: 30 minutes    Caz Weaver Darleen Crocker, DO [facility] Pager [phone]  If 7PM-7AM, please contact night-coverage [email or web address] Password [email or web address] 09/09/2018, 12:00 PM

## 2018-09-09 NOTE — Discharge Summary (Signed)
Physician Discharge Summary  Bradley [name] SWN:[id number] DOB: 25-Aug-1940 DOA: 08/31/2018  PCP: Jani Gravel, MD  Admit date: 08/31/2018  Discharge date: 09/09/2018  Admitted From:Home  Disposition:  Hospice home  Recommendations for Outpatient Follow-up:  1. Follow up with PCP in 1-2 weeks  Home Health:N/A  Equipment/Devices:None  Discharge Condition:Stable  CODE STATUS: DNR  Diet recommendation: Regular  Brief/Interim Summary: 78 year old male with a history of non-small cell lung cancer, COPD, coronary disease, hyperlipidemia, hypertension presenting on 08/31/2018 secondary to shortness of breath.Apparently, the patient was having some shortness of breath and did not look good per his daughter while eating dinner on 08/31/2018. EMS was activated. At baseline, the patient is supposed to be on 4 L nasal cannula at home. Upon arrival, the patient was noted to have oxygen saturation of 77% in the emergency department. He required 7 L to maintain oxygen saturations above 90%. Upon presentation, the patient was started on vancomycin and cefepime. Vancomycin has been since discontinued. The patient continued to have slow clinical course. He required placement on BiPAP. Pulmonary medicine was consulted to assist with management. The patient was started on IV steroids. Palliative medicine was consulted to discuss goals of care. The patient was subsequently weaned off of BiPAP on 09/04/2018.  8/13: Patient is currently on 4 L nasal cannula and continues to have intermittent dyspnea at rest.  He is very weak and deconditioned and at high risk for respiratory decompensation which would lead to death.  He is also starting to become more confused.  He appears to be appropriate for hospice facility as he has overall prognosis less than 6 weeks.  The patient was overall managed for acute on chronic hypoxemic respiratory failure secondary to sepsis with healthcare associated pneumonia as well as  COPD exacerbation.  He and his daughter now agreed to hospice facility on discharge after discussion with palliative care.  This appears to be the most appropriate course of action.  He will be discontinued on his other medications aside from those for comfort.  Overall prognosis is quite poor and likely less than 6 weeks.  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)   Malignant neoplasm of left lung (HCC)   HCAP (healthcare-associated pneumonia)   Terminal care   Acute and chronic respiratory failure with hypoxia (HCC)   Sepsis due to undetermined organism [facility])    Discharge Instructions  Discharge Instructions    Diet - low sodium heart healthy   Complete by: As directed    Increase activity slowly   Complete by: As directed      Allergies as of 09/09/2018      Reactions   Aspirin    Allergy to regular Aspirin only      Medication List    STOP taking these medications   Advair HFA 115-21 MCG/ACT inhaler Generic drug: fluticasone-salmeterol   amLODipine 5 MG tablet Commonly known as: NORVASC   aspirin EC 81 MG tablet   fluticasone 50 MCG/ACT nasal spray Commonly known as: FLONASE   furosemide 20 MG tablet Commonly known as: Lasix   HM Potassium 595 (99 K) MG Tabs tablet Generic drug: potassium gluconate   Iron 325 (65 Fe) MG Tabs   lidocaine-prilocaine cream Commonly known as: EMLA   lisinopril 20 MG tablet Commonly known  as: ZESTRIL   metoprolol succinate 25 MG 24 hr tablet Commonly known as: TOPROL-XL   multivitamin with minerals tablet   nitroGLYCERIN 0.4 MG SL tablet Commonly known as: NITROSTAT   OPDIVO IV   polyethylene glycol 17 g packet Commonly known as: MIRALAX / GLYCOLAX   simvastatin 10 MG tablet Commonly known as: ZOCOR   Ventolin HFA 108  (90 Base) MCG/ACT inhaler Generic drug: albuterol   Vitamin C 500 MG Caps   YERVOY IV     TAKE these medications   cyclobenzaprine 5 MG tablet Commonly known as: FLEXERIL Take 1 tablet (5 mg total) by mouth 3 (three) times daily.   gabapentin 300 MG capsule Commonly known as: NEURONTIN Take 1 capsule (300 mg total) by mouth 3 (three) times daily.   ipratropium-albuterol 0.5-2.5 (3) MG/3ML Soln Commonly known as: DUONEB Take 3 mLs by nebulization every 6 (six) hours as needed (for shortness of breath).   metoCLOPramide 5 MG tablet Commonly known as: REGLAN Take 1 tablet by mouth 4 (four) times daily -  before meals and at bedtime. Daily as needed   Myrbetriq 50 MG Tb24 tablet Generic drug: mirabegron ER Take 1 tablet by mouth daily.   Oxycodone HCl 10 MG Tabs Take 1 tablet (10 mg total) by mouth every 4 (four) hours.   PriLOSEC 40 MG capsule Generic drug: omeprazole Take 1 capsule by mouth daily.      [place] to.   Contact information: [address] [place] [address] [phone]        Jani Gravel, MD Follow up in 1 week(s).   Specialty: Internal Medicine Contact information: [place] [address] [phone]        Herminio Commons, MD .   Specialty: Cardiology Contact information: [place] [address] [phone]          Allergies  Allergen Reactions  . Aspirin     Allergy to regular Aspirin only      Consultations:  Pulmonology  Palliative care   Procedures/Studies: Dg Chest 2 View  Result Date: 08/25/2018 CLINICAL DATA:  Malignant neoplasm of left lung. EXAM: CHEST - 2 VIEW COMPARISON:  08/05/2018 and 06/22/2018 FINDINGS: Right subclavian Port-A-Cath is stable in the upper SVC region. Heart and mediastinum are stable. Again noted are prominent interstitial densities in the right lower chest without significantly change from the  most recent comparison examination but progression since May 2020. Chronic parenchymal densities in left lung have not significantly changed. There appears to be a left-sided pleural catheter, best seen on the lateral view. No large pleural effusions. IMPRESSION: 1. Stable chest radiograph findings. Minimal change from 08/05/2018. 2. Prominent interstitial densities in both lungs compatible with chronic changes. There may be a component of subacute disease in the right lung. Electronically Signed   By: Markus Daft M.D.   On: 08/25/2018 13:32   Dg Chest Port 1 View  Result Date: 09/04/2018 CLINICAL DATA:  Acute and chronic respiratory failure with hypoxia. Small-cell lung cancer. COPD. EXAM: PORTABLE CHEST 1 VIEW COMPARISON:  09/03/2018 FINDINGS: Right subclavian catheter tip at low SVC. Patient rotated right. Numerous leads and wires project over the chest. Mild cardiomegaly. Chronic costophrenic angle blunting may be slightly improved. No pneumothorax. Diffuse marked interstitial opacities throughout both lungs. Possible somewhat more confluent opacity in the right lower lobe laterally, not significantly changed. IMPRESSION: No significant change since 1 day prior.  Diffuse interstitial prominence is likely related to COPD/chronic bronchitis. Superimposed pulmonary edema or atypical infection cannot be excluded. Suspect somewhat more confluent right lower lobe airspace disease, similar. This may represent an area of lobar pneumonia. Electronically Signed   By: Abigail Miyamoto M.D.   On: 09/04/2018 09:45   Dg Chest Port 1 View  Result Date: 09/03/2018 CLINICAL DATA:  Pneumonia. EXAM: PORTABLE CHEST 1 VIEW COMPARISON:  Three days ago FINDINGS: Coarse interstitial opacities with mildly low lung volumes. 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 There is history of pneumonia which could be easily obscured. Chronic costophrenic sulcus blunting. No pneumothorax. Right subclavian porta catheter in good position. IMPRESSION:  Stable from 3 days ago. Chronic fibrotic and emphysematous lung disease with possible superimposed pneumonia Electronically Signed   By: Monte Fantasia M.D.   On: 09/03/2018 05:59   Dg Chest Portable 1 View  Result Date: 08/31/2018 CLINICAL DATA:  Shortness of breath for 2 days EXAM: PORTABLE CHEST 1 VIEW COMPARISON:  08/25/2018 FINDINGS: Cardiac shadow is stable. Right chest wall port is again seen. Diffuse fibrotic changes are again identified throughout both lungs. Some increased infiltrate in the left upper lobe is noted new from the prior exam. No sizable acute effusion is seen. No bony abnormality is noted. IMPRESSION: Acute on chronic infiltrate in the left upper lobe. Diffuse fibrotic changes are again identified. Electronically Signed   By: Inez Catalina M.D.   On: 08/31/2018 22:15     Discharge Exam: Vitals:   09/09/18 1300 09/09/18 1400  BP: 139/61 (!) 154/78  Pulse: 80 80  Resp:    Temp:    SpO2: 99% 98%   Vitals:   09/09/18 1100 09/09/18 1200 09/09/18 1300 09/09/18 1400  BP: (!) 152/88 (!) 166/84 139/61 (!) 154/78  Pulse: 85 82 80 80  Resp:      Temp:      TempSrc:      SpO2: 97% 97% 99% 98%  Weight:      Height:        General: Pt is alert, awake, not in acute distress, confused Cardiovascular: RRR, S1/S2 +, no rubs, no gallops Respiratory: CTA bilaterally, no wheezing, no rhonchi on 4L [place] Abdominal: Soft, NT, ND, bowel sounds + Extremities: no edema, no cyanosis    The results of significant diagnostics from this hospitalization (including imaging, microbiology, ancillary and laboratory) are listed below for reference.     Microbiology: Recent Results (from the past 240 hour(s))  Urine culture     Status: None   Collection Time: 08/31/18  9:45 PM   Specimen: In/Out Cath Urine  Result Value Ref Range Status   Specimen Description   Final    IN/OUT CATH URINE Performed at [facility], [address] [place], [place] [address]    Special Requests   Final     NONE Performed at [facility] [address], [place], [place] [address]    Culture   Final    NO GROWTH Performed at [facility], [place] [address] [place], [place] [address]    Report Status 09/02/2018 FINAL  Final  SARS Coronavirus 2 [facility] order, Performed in [facility] hospital lab) Nasopharyngeal Nasopharyngeal Swab     Status: None   Collection Time: 08/31/18  9:46 PM   Specimen: Nasopharyngeal Swab  Result Value Ref Range Status   SARS Coronavirus 2 NEGATIVE NEGATIVE Final    Comment: (NOTE) If result is NEGATIVE SARS-CoV-2 target nucleic acids are NOT DETECTED.  The SARS-CoV-2 RNA is generally detectable in upper and lower  respiratory specimens during the acute phase of infection. The lowest  concentration of SARS-CoV-2 viral copies this assay can detect is 250  copies / mL. A negative result does not preclude SARS-CoV-2 infection  and should not be used as the sole basis for treatment or other  patient management decisions.  A negative result may occur with  improper specimen collection / handling, submission of specimen other  than nasopharyngeal swab, presence of viral mutation(s) within the  areas targeted by this assay, and inadequate number of viral copies  (<250 copies / mL). A negative result must be combined with clinical  observations, patient history, and epidemiological information. If result is POSITIVE SARS-CoV-2 target nucleic acids are DETECTED. The SARS-CoV-2 RNA is generally detectable in upper and lower  respiratory specimens dur ing the acute phase of infection.  Positive  results are indicative of active infection with SARS-CoV-2.  Clinical  correlation with patient history and other diagnostic information is  necessary to determine patient infection status.  Positive results do  not rule out bacterial infection or co-infection with other viruses. If result is PRESUMPTIVE POSTIVE SARS-CoV-2 nucleic acids MAY BE PRESENT.   A presumptive  positive result was obtained on the submitted specimen  and confirmed on repeat testing.  While 2019 novel coronavirus  (SARS-CoV-2) nucleic acids may be present in the submitted sample  additional confirmatory testing may be necessary for epidemiological  and / or clinical management purposes  to differentiate between  SARS-CoV-2 and other Sarbecovirus currently known to infect humans.  If clinically indicated additional testing with an alternate test  methodology [phone]) is advised. The SARS-CoV-2 RNA is generally  detectable in upper and lower respiratory sp ecimens during the acute  phase of infection. The expected result is Negative. Fact Sheet for Patients:  StrictlyIdeas.no Fact Sheet for Healthcare Providers: BankingDealers.co.za This test is not yet approved or cleared by the [place] FDA and has been authorized for detection and/or diagnosis of SARS-CoV-2 by FDA under an Emergency Use Authorization (EUA).  This EUA will remain in effect (meaning this test can be used) for the duration of the COVID-19 declaration under Section 564(b)(1) of the Act, 21 U.S.C. section 360bbb-3(b)(1), unless the authorization is terminated or revoked sooner. Performed at [facility], [address] [place], [place] [address]   Blood Culture (routine x 2)     Status: Abnormal   Collection Time: 08/31/18 10:03 PM   Specimen: BLOOD RIGHT HAND  Result Value Ref Range Status   Specimen Description   Final    BLOOD RIGHT HAND Performed at [facility], [address], [place], [place] [address]    Special Requests   Final    BOTTLES DRAWN AEROBIC AND ANAEROBIC Blood Culture adequate volume Performed at [facility], [address], [place], [place] [address]    Culture  Setup Time   Final    GRAM POSITIVE COCCI IN BOTH AEROBIC AND ANAEROBIC BOTTLES Gram Stain Report Called to,Read Back By and Verified With: MINTER,R ON 09/01/18 AT 69 BY  LOY,C PERFORMED AT [facility] CRITICAL RESULT CALLED TO, READ BACK BY AND VERIFIED WITH: C HOPKINS RN 09/01/18 2220 JDW    Culture (A)  Final    STAPHYLOCOCCUS SPECIES (COAGULASE NEGATIVE) THE SIGNIFICANCE OF ISOLATING THIS ORGANISM FROM A SINGLE SET OF BLOOD CULTURES WHEN MULTIPLE SETS ARE DRAWN IS UNCERTAIN. PLEASE NOTIFY THE MICROBIOLOGY DEPARTMENT WITHIN ONE WEEK IF SPECIATION AND SENSITIVITIES ARE REQUIRED.  Performed at [facility], [place] [address], [place], [place] [address]    Report Status 09/03/2018 FINAL  Final  Blood Culture ID Panel (Reflexed)     Status: Abnormal   Collection Time: 08/31/18 10:03 PM  Result Value Ref Range Status   Enterococcus species NOT DETECTED NOT DETECTED Final   Listeria monocytogenes NOT DETECTED NOT DETECTED Final   Staphylococcus species DETECTED (A) NOT DETECTED Final    Comment: Methicillin (oxacillin) resistant coagulase negative staphylococcus. Possible blood culture contaminant (unless isolated from more than one blood culture draw or clinical case suggests pathogenicity). No antibiotic treatment is indicated for blood  culture contaminants. CRITICAL RESULT CALLED TO, READ BACK BY AND VERIFIED WITH: C HOPKINS RN 09/01/18 2220 JDW    Staphylococcus aureus (BCID) NOT DETECTED NOT DETECTED Final   Methicillin resistance DETECTED (A) NOT DETECTED Final    Comment: CRITICAL RESULT CALLED TO, READ BACK BY AND VERIFIED WITH: C HOPKINS RN 09/01/18 2220 JDW    Streptococcus species NOT DETECTED NOT DETECTED Final   Streptococcus agalactiae NOT DETECTED NOT DETECTED Final   Streptococcus pneumoniae NOT DETECTED NOT DETECTED Final   Streptococcus pyogenes NOT DETECTED NOT DETECTED Final   Acinetobacter baumannii NOT DETECTED NOT DETECTED Final   Enterobacteriaceae species NOT DETECTED NOT DETECTED Final   Enterobacter cloacae complex NOT DETECTED NOT DETECTED Final   Escherichia coli NOT DETECTED NOT DETECTED Final   Klebsiella oxytoca NOT DETECTED NOT  DETECTED Final   Klebsiella pneumoniae NOT DETECTED NOT DETECTED Final   Proteus species NOT DETECTED NOT DETECTED Final   Serratia marcescens NOT DETECTED NOT DETECTED Final   Haemophilus influenzae NOT DETECTED NOT DETECTED Final   Neisseria meningitidis NOT DETECTED NOT DETECTED Final   Pseudomonas aeruginosa NOT DETECTED NOT DETECTED Final   Candida albicans NOT DETECTED NOT DETECTED Final   Candida glabrata NOT DETECTED NOT DETECTED Final   Candida krusei NOT DETECTED NOT DETECTED Final   Candida parapsilosis NOT DETECTED NOT DETECTED Final   Candida tropicalis NOT DETECTED NOT DETECTED Final    Comment: Performed at [facility], [place] [address], [place], [place] [address]  Blood Culture (routine x 2)     Status: None   Collection Time: 08/31/18 10:04 PM   Specimen: BLOOD RIGHT ARM  Result Value Ref Range Status   Specimen Description BLOOD RIGHT ARM  Final   Special Requests   Final    BOTTLES DRAWN AEROBIC AND ANAEROBIC Blood Culture adequate volume   Culture   Final    NO GROWTH 5 DAYS Performed at [facility], [address] [place], [place] [address]    Report Status 09/05/2018 FINAL  Final  Culture, respiratory     Status: None   Collection Time: 09/01/18 11:27 AM  Result Value Ref Range Status   Specimen Description   Final    EXPECTORATED SPUTUM Performed at [facility], [address] [place], [place] [address]    Special Requests   Final    NONE Performed at [facility], [address] [place], [place] [address]    Gram Stain   Final    FEW WBC PRESENT,BOTH PMN AND MONONUCLEAR FEW GRAM POSITIVE COCCI FEW GRAM VARIABLE ROD ABUNDANT YEAST ABUNDANT SQUAMOUS EPITHELIAL CELLS PRESENT    Culture   Final    Consistent with normal respiratory flora. Performed at [facility], [place] [address], [place], [place] [address]    Report Status 09/03/2018 FINAL  Final  MRSA PCR  Screening     Status: None   Collection Time: 09/01/18  8:39 PM   Specimen:  Nasal Mucosa; Nasopharyngeal  Result Value Ref Range Status   MRSA by PCR NEGATIVE NEGATIVE Final    Comment:        The GeneXpert MRSA Assay (FDA approved for NASAL specimens only), is one component of a comprehensive MRSA colonization surveillance program. It is not intended to diagnose MRSA infection nor to guide or monitor treatment for MRSA infections. Performed at [facility], [address], [place], [place] [address]   SARS CORONAVIRUS 2 Nasal Swab Aptima Multi Swab     Status: None   Collection Time: 09/08/18  3:50 PM   Specimen: Aptima Multi Swab; Nasal Swab  Result Value Ref Range Status   SARS Coronavirus 2 NEGATIVE NEGATIVE Final    Comment: (NOTE) SARS-CoV-2 target nucleic acids are NOT DETECTED. The SARS-CoV-2 RNA is generally detectable in upper and lower respiratory specimens during the acute phase of infection. Negative results do not preclude SARS-CoV-2 infection, do not rule out co-infections with other pathogens, and should not be used as the sole basis for treatment or other patient management decisions. Negative results must be combined with clinical observations, patient history, and epidemiological information. The expected result is Negative. Fact Sheet for Patients: SugarRoll.be Fact Sheet for Healthcare Providers: https://www.woods-mathews.com/ This test is not yet approved or cleared by the [place] FDA and  has been authorized for detection and/or diagnosis of SARS-CoV-2 by FDA under an Emergency Use Authorization (EUA). This EUA will remain  in effect (meaning this test can be used) for the duration of the COVID-19 declaration under Section 56 4(b)(1) of the Act, 21 U.S.C. section 360bbb-3(b)(1), unless the authorization is terminated or revoked sooner. Performed at [facility], [place] [address] [place], [place] [address]      Labs: BNP (last 3 results) Recent Labs    06/25/18 0915  08/03/18 1831 08/31/18 2204  BNP 52.0 77.0 425.9*   Basic Metabolic Panel: Recent Labs  Lab 09/03/18 0512 09/04/18 0442 09/07/18 0341 09/08/18 0600 09/09/18 0413  NA 131* 136 139  --  138  K 3.7 3.9 4.0  --  4.1  CL 99 102 99  --  98  CO2 23 24 32  --  30  GLUCOSE 101* 133* 147*  --  131*  BUN 15 26* 26*  --  33*  CREATININE 0.85 0.79 0.77 0.71 0.66  CALCIUM 8.5* 8.7* 8.7*  --  8.7*  MG  --   --  2.1  --  2.4  PHOS  --   --   --   --  2.8   Liver Function Tests: Recent Labs  Lab 09/03/18 0512 09/07/18 0341  AST 14* 25  ALT 10 26  ALKPHOS 23* 18*  BILITOT 1.0 0.7  PROT 7.8 8.1  ALBUMIN 2.4* 2.6*   No results for input(s): LIPASE, AMYLASE in the last 168 hours. No results for input(s): AMMONIA in the last 168 hours. CBC: Recent Labs  Lab 09/03/18 0512 09/04/18 0442 09/07/18 0341  WBC 15.0* 10.9* 13.1*  NEUTROABS  --   --  11.0*  HGB 13.0 12.4* 13.0  HCT 41.3 39.0 41.8  MCV 90.0 88.8 90.7  PLT 529* 525* 541*   Cardiac Enzymes: No results for input(s): CKTOTAL, CKMB, CKMBINDEX, TROPONINI in the last 168 hours. BNP: Invalid input(s): POCBNP CBG: No results for input(s): GLUCAP in the last 168 hours. D-Dimer No results for  input(s): DDIMER in the last 72 hours. Hgb A1c No results for input(s): HGBA1C in the last 72 hours. Lipid Profile No results for input(s): CHOL, HDL, LDLCALC, TRIG, CHOLHDL, LDLDIRECT in the last 72 hours. Thyroid function studies No results for input(s): TSH, T4TOTAL, T3FREE, THYROIDAB in the last 72 hours.  Invalid input(s): FREET3 Anemia work up No results for input(s): VITAMINB12, FOLATE, FERRITIN, TIBC, IRON, RETICCTPCT in the last 72 hours. Urinalysis    Component Value Date/Time   COLORURINE YELLOW 08/31/2018 2145   APPEARANCEUR CLEAR 08/31/2018 2145   LABSPEC 1.017 08/31/2018 2145   PHURINE 5.0 08/31/2018 2145   GLUCOSEU NEGATIVE 08/31/2018 2145   HGBUR NEGATIVE 08/31/2018 2145   BILIRUBINUR NEGATIVE 08/31/2018 2145    KETONESUR NEGATIVE 08/31/2018 2145   PROTEINUR NEGATIVE 08/31/2018 2145   NITRITE NEGATIVE 08/31/2018 2145   LEUKOCYTESUR NEGATIVE 08/31/2018 2145   Sepsis Labs Invalid input(s): PROCALCITONIN,  WBC,  LACTICIDVEN Microbiology Recent Results (from the past 240 hour(s))  Urine culture     Status: None   Collection Time: 08/31/18  9:45 PM   Specimen: In/Out Cath Urine  Result Value Ref Range Status   Specimen Description   Final    IN/OUT CATH URINE Performed at [facility], [address], [place], [place] [address]    Special Requests   Final    NONE Performed at [facility], [address] [place], [place] [address]    Culture   Final    NO GROWTH Performed at [facility], [place] [address], [place], [place] [address]    Report Status 09/02/2018 FINAL  Final  SARS Coronavirus 2 [facility] order, Performed in [facility] hospital lab) Nasopharyngeal Nasopharyngeal Swab     Status: None   Collection Time: 08/31/18  9:46 PM   Specimen: Nasopharyngeal Swab  Result Value Ref Range Status   SARS Coronavirus 2 NEGATIVE NEGATIVE Final    Comment: (NOTE) If result is NEGATIVE SARS-CoV-2 target nucleic acids are NOT DETECTED. The SARS-CoV-2 RNA is generally detectable in upper and lower  respiratory specimens during the acute phase of infection. The lowest  concentration of SARS-CoV-2 viral copies this assay can detect is 250  copies / mL. A negative result does not preclude SARS-CoV-2 infection  and should not be used as the sole basis for treatment or other  patient management decisions.  A negative result may occur with  improper specimen collection / handling, submission of specimen other  than nasopharyngeal swab, presence of viral mutation(s) within the  areas targeted by this assay, and inadequate number of viral copies  (<250 copies / mL). A negative result must be combined with clinical  observations, patient history, and epidemiological information. If result is  POSITIVE SARS-CoV-2 target nucleic acids are DETECTED. The SARS-CoV-2 RNA is generally detectable in upper and lower  respiratory specimens dur ing the acute phase of infection.  Positive  results are indicative of active infection with SARS-CoV-2.  Clinical  correlation with patient history and other diagnostic information is  necessary to determine patient infection status.  Positive results do  not rule out bacterial infection or co-infection with other viruses. If result is PRESUMPTIVE POSTIVE SARS-CoV-2 nucleic acids MAY BE PRESENT.   A presumptive positive result was obtained on the submitted specimen  and confirmed on repeat testing.  While 2019 novel coronavirus  (SARS-CoV-2) nucleic acids may be present in the submitted sample  additional confirmatory testing may be necessary for epidemiological  and / or clinical management purposes  to differentiate between  SARS-CoV-2 and other Sarbecovirus currently known to infect humans.  If clinically indicated additional testing with an alternate test  methodology [phone]) is advised. The SARS-CoV-2 RNA is generally  detectable in upper and lower respiratory sp ecimens during the acute  phase of infection. The expected result is Negative. Fact Sheet for Patients:  StrictlyIdeas.no Fact Sheet for Healthcare Providers: BankingDealers.co.za This test is not yet approved or cleared by the [place] FDA and has been authorized for detection and/or diagnosis of SARS-CoV-2 by FDA under an Emergency Use Authorization (EUA).  This EUA will remain in effect (meaning this test can be used) for the duration of the COVID-19 declaration under Section 564(b)(1) of the Act, 21 U.S.C. section 360bbb-3(b)(1), unless the authorization is terminated or revoked sooner. Performed at [facility], [address] [place], [place] [address]   Blood Culture (routine x 2)     Status: Abnormal   Collection  Time: 08/31/18 10:03 PM   Specimen: BLOOD RIGHT HAND  Result Value Ref Range Status   Specimen Description   Final    BLOOD RIGHT HAND Performed at [facility], [address], [place], [place] [address]    Special Requests   Final    BOTTLES DRAWN AEROBIC AND ANAEROBIC Blood Culture adequate volume Performed at [facility], [address], [place], [place] [address]    Culture  Setup Time   Final    GRAM POSITIVE COCCI IN BOTH AEROBIC AND ANAEROBIC BOTTLES Gram Stain Report Called to,Read Back By and Verified With: MINTER,R ON 09/01/18 AT 24 BY LOY,C PERFORMED AT [facility] CRITICAL RESULT CALLED TO, READ BACK BY AND VERIFIED WITH: C HOPKINS RN 09/01/18 2220 JDW    Culture (A)  Final    STAPHYLOCOCCUS SPECIES (COAGULASE NEGATIVE) THE SIGNIFICANCE OF ISOLATING THIS ORGANISM FROM A SINGLE SET OF BLOOD CULTURES WHEN MULTIPLE SETS ARE DRAWN IS UNCERTAIN. PLEASE NOTIFY THE MICROBIOLOGY DEPARTMENT WITHIN ONE WEEK IF SPECIATION AND SENSITIVITIES ARE REQUIRED. Performed at [facility], [place] [address], [place], [place] [address]    Report Status 09/03/2018 FINAL  Final  Blood Culture ID Panel (Reflexed)     Status: Abnormal   Collection Time: 08/31/18 10:03 PM  Result Value Ref Range Status   Enterococcus species NOT DETECTED NOT DETECTED Final   Listeria monocytogenes NOT DETECTED NOT DETECTED Final   Staphylococcus species DETECTED (A) NOT DETECTED Final    Comment: Methicillin (oxacillin) resistant coagulase negative staphylococcus. Possible blood culture contaminant (unless isolated from more than one blood culture draw or clinical case suggests pathogenicity). No antibiotic treatment is indicated for blood  culture contaminants. CRITICAL RESULT CALLED TO, READ BACK BY AND VERIFIED WITH: C HOPKINS RN 09/01/18 2220 JDW    Staphylococcus aureus (BCID) NOT DETECTED NOT DETECTED Final   Methicillin resistance DETECTED (A) NOT DETECTED Final    Comment: CRITICAL RESULT CALLED TO, READ BACK BY  AND VERIFIED WITH: C HOPKINS RN 09/01/18 2220 JDW    Streptococcus species NOT DETECTED NOT DETECTED Final   Streptococcus agalactiae NOT DETECTED NOT DETECTED Final   Streptococcus pneumoniae NOT DETECTED NOT DETECTED Final   Streptococcus pyogenes NOT DETECTED NOT DETECTED Final   Acinetobacter baumannii NOT DETECTED NOT DETECTED Final   Enterobacteriaceae species NOT DETECTED NOT DETECTED Final   Enterobacter cloacae complex NOT DETECTED NOT DETECTED Final   Escherichia coli NOT DETECTED NOT DETECTED Final   Klebsiella oxytoca NOT DETECTED NOT DETECTED Final   Klebsiella pneumoniae NOT DETECTED NOT DETECTED  Final   Proteus species NOT DETECTED NOT DETECTED Final   Serratia marcescens NOT DETECTED NOT DETECTED Final   Haemophilus influenzae NOT DETECTED NOT DETECTED Final   Neisseria meningitidis NOT DETECTED NOT DETECTED Final   Pseudomonas aeruginosa NOT DETECTED NOT DETECTED Final   Candida albicans NOT DETECTED NOT DETECTED Final   Candida glabrata NOT DETECTED NOT DETECTED Final   Candida krusei NOT DETECTED NOT DETECTED Final   Candida parapsilosis NOT DETECTED NOT DETECTED Final   Candida tropicalis NOT DETECTED NOT DETECTED Final    Comment: Performed at [facility], [place] [address], [place], [place] [address]  Blood Culture (routine x 2)     Status: None   Collection Time: 08/31/18 10:04 PM   Specimen: BLOOD RIGHT ARM  Result Value Ref Range Status   Specimen Description BLOOD RIGHT ARM  Final   Special Requests   Final    BOTTLES DRAWN AEROBIC AND ANAEROBIC Blood Culture adequate volume   Culture   Final    NO GROWTH 5 DAYS Performed at [facility], [address] [place], [place] [address]    Report Status 09/05/2018 FINAL  Final  Culture, respiratory     Status: None   Collection Time: 09/01/18 11:27 AM  Result Value Ref Range Status   Specimen Description   Final    EXPECTORATED SPUTUM Performed at [facility], [address] [place], [place] [address]     Special Requests   Final    NONE Performed at [facility], [address], [place], [place] [address]    Gram Stain   Final    FEW WBC PRESENT,BOTH PMN AND MONONUCLEAR FEW GRAM POSITIVE COCCI FEW GRAM VARIABLE ROD ABUNDANT YEAST ABUNDANT SQUAMOUS EPITHELIAL CELLS PRESENT    Culture   Final    Consistent with normal respiratory flora. Performed at [facility], [place] [address] [place], [place] [address]    Report Status 09/03/2018 FINAL  Final  MRSA PCR Screening     Status: None   Collection Time: 09/01/18  8:39 PM   Specimen: Nasal Mucosa; Nasopharyngeal  Result Value Ref Range Status   MRSA by PCR NEGATIVE NEGATIVE Final    Comment:        The GeneXpert MRSA Assay (FDA approved for NASAL specimens only), is one component of a comprehensive MRSA colonization surveillance program. It is not intended to diagnose MRSA infection nor to guide or monitor treatment for MRSA infections. Performed at [facility], [address] [place], [place] [address]   SARS CORONAVIRUS 2 Nasal Swab Aptima Multi Swab     Status: None   Collection Time: 09/08/18  3:50 PM   Specimen: Aptima Multi Swab; Nasal Swab  Result Value Ref Range Status   SARS Coronavirus 2 NEGATIVE NEGATIVE Final    Comment: (NOTE) SARS-CoV-2 target nucleic acids are NOT DETECTED. The SARS-CoV-2 RNA is generally detectable in upper and lower respiratory specimens during the acute phase of infection. Negative results do not preclude SARS-CoV-2 infection, do not rule out co-infections with other pathogens, and should not be used as the sole basis for treatment or other patient management decisions. Negative results must be combined with clinical observations, patient history, and epidemiological information. The expected result is Negative. Fact Sheet for Patients: SugarRoll.be Fact Sheet for Healthcare Providers: https://www.woods-mathews.com/ This test is not yet  approved or cleared by the [place] FDA and  has been authorized for detection and/or diagnosis of SARS-CoV-2 by FDA under an  Emergency Use Authorization (EUA). This EUA will remain  in effect (meaning this test can be used) for the duration of the COVID-19 declaration under Section 56 4(b)(1) of the Act, 21 U.S.C. section 360bbb-3(b)(1), unless the authorization is terminated or revoked sooner. Performed at [facility], [place] [address], [place], [place] [address]      Time coordinating discharge: 35 minutes  SIGNED:   Rodena Goldmann, DO [facility] 09/09/2018, 2:41 PM  If 7PM-7AM, please contact night-coverage [email or web address] Password [email or web address]

## 2018-09-09 NOTE — Progress Notes (Signed)
Daily Progress Note   Patient Name: Bradley [name]       Date: 09/09/2018 DOB: 11/15/1940  Age: 78 y.o. MRN#: [id number] Attending Physician: Rodena Goldmann, DO Primary Care Physician: Jani Gravel, MD Admit Date: 08/31/2018  Reason for Consultation/Follow-up: Establishing goals of care  Subjective: Patient awake, alert, with periods of confusion and forgetfulness. He is hard of hearing. He is glad his daughter is visiting at bedside. He denies pain or discomfort. Appears mildly short of breath at rest on 4L.   GOC:  F/u GOC with daughter, Bradley [name] at bedside. Bradley [name] is familiar with palliative services.   Discussed events leading up to admission, course of hospitalization including diagnoses, interventions and plan of care.   Bradley [name] visited with her father last night. They talked about hospice. Bradley [name] told her he understands his body is no longer responding to cancer treatments, that he is at peace with end-of-life, and he is ready to see the "golden gates" and his deceased granddaughter welcome him into heaven. He is not afraid of death. He agrees with pursuing hospice facility placement, understanding poor prognosis of likely weeks.  Bradley [name] is appreciative of f/u from Dr. Delton Coombes. She reports he called her this morning and agreed with plan to pursue hospice facility. Daughter reports that per oncologist, prognosis could be weeks to a month.   Bradley [name] is tearful but just wishes for comfort for her father. She knows he is nearing EOL and shares she marked off many things on his bucket list, including "carrying him to church one last time" two weeks ago.   Discussed transition to hospice facility including discontinuation of interventions not aimed at comfort and soul focus on comfort and symptom  management. Patient/daughter confirm understanding of this. Educated on EOL expectations and comfort feeds.   Answered questions and concerns. Spiritual/emotional support provided. PMT contact information given.    Length of Stay: 8  Current Medications: Scheduled Meds:  . amLODipine  5 mg Oral Daily  . aspirin EC  81 mg Oral Daily  . Chlorhexidine Gluconate Cloth  6 each Topical Q0600  . enoxaparin (LOVENOX) injection  40 mg Subcutaneous Q24H  . ferrous sulfate  325 mg Oral Daily  . fluticasone  2 spray Each Nare Daily  . fluticasone furoate-vilanterol  1 puff Inhalation Daily  . gabapentin  300  mg Oral TID  . ipratropium  0.5 mg Nebulization Q6H WA  . levalbuterol  1.25 mg Nebulization Q6H WA  . lisinopril  10 mg Oral Daily  . methylPREDNISolone (SOLU-MEDROL) injection  40 mg Intravenous Q8H  . metoCLOPramide  5 mg Oral TID AC & HS  . metoprolol succinate  25 mg Oral Daily  . mirabegron ER  50 mg Oral Daily  . montelukast  10 mg Oral Daily  . multivitamin with minerals  1 tablet Oral Daily  . nicotine  14 mg Transdermal Daily  . oxyCODONE  10 mg Oral Q4H  . pantoprazole  40 mg Oral Daily  . simvastatin  10 mg Oral QHS  . vitamin C  500 mg Oral Daily    Continuous Infusions: . sodium chloride Stopped (09/03/18 0532)    PRN Meds: sodium chloride, albuterol, ipratropium-albuterol, LORazepam, morphine injection, polyethylene glycol  Physical Exam Vitals signs and nursing note reviewed.  Constitutional:      General: He is awake.     Appearance: He is cachectic. He is ill-appearing.  HENT:     Head: Normocephalic and atraumatic.  Cardiovascular:     Rate and Rhythm: Normal rate.  Pulmonary:     Effort: No tachypnea, accessory muscle usage or respiratory distress.  Skin:    General: Skin is warm and dry.  Neurological:     Mental Status: He is alert.     Comments: Oriented to person/place. Intermittently confused and forgetful.              Vital Signs: BP (!)  152/88   Pulse 85   Temp 98.1 F (36.7 C) (Axillary)   Resp 16   Ht 6' (1.829 m)   Wt 64.7 kg   SpO2 97%   BMI 19.35 kg/m  SpO2: SpO2: 97 % O2 Device: O2 Device: Nasal Cannula O2 Flow Rate: O2 Flow Rate (L/min): 4 L/min  Intake/output summary:   Intake/Output Summary (Last 24 hours) at 09/09/2018 1312 Last data filed at 09/09/2018 0500 Gross per 24 hour  Intake -  Output 1450 ml  Net -1450 ml   LBM: Last BM Date: 08/30/18 Baseline Weight: Weight: 68 kg Most recent weight: Weight: 64.7 kg       Palliative Assessment/Data: PPS 30%      Patient Active Problem List   Diagnosis Date Noted  . Sepsis due to undetermined organism ([place]) 09/08/2018  . Acute and chronic respiratory failure with hypoxia ([place]) 09/01/2018  . Dyspnea   . Hypoxia   . Acute respiratory failure with hypoxia ([place]) 08/03/2018  . Hyperglycemia 08/03/2018  . Acute on chronic respiratory failure with hypoxia ([place]) 08/03/2018  . Recurrent left pleural effusion 06/25/2018  . Postobstructive pneumonia 06/25/2018  . On home O2 06/25/2018  . Acute on chronic respiratory failure ([place]) 06/25/2018  . Metastatic cancer ([place])   . Palliative care encounter   . Encounter for hospice care discussion   . HCAP (healthcare-associated pneumonia) 06/13/2018  . Acute maxillary sinusitis 12/30/2017  . Palliative care by specialist   . Goals of care, counseling/discussion   . Malignant neoplasm of left lung ([place])   . Left leg weakness   . Nosocomial pneumonia 12/29/2017  . 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 Mass of lower lobe of left lung 12/10/2017  . Chest pain in adult   . Syncope and collapse   . Rt Hip fracture ([place]) 12/19/2016  . Chest pain 09/12/2016  . Leukocytosis 09/12/2016  .  Abnormal chest x-ray 09/12/2016  . Hyperlipidemia 09/12/2016  . Hypokalemia 09/12/2016  . Precordial pain   . Tobacco abuse 09/11/2016  . COPD (chronic obstructive pulmonary disease) ([place]) 09/11/2016  .  CAD (coronary artery disease), native coronary artery 09/11/2016  . Chronic pain syndrome 09/11/2016  . Essential hypertension 09/11/2016  . Right-sided epistaxis 03/15/2016    Palliative Care Assessment & Plan   Patient Profile:  78 y.o. male  with past medical history of non-small cell lung cancer, COPD on home oxygen, CAD, HLD, anemia, HTN, smoker admitted on 08/31/2018 with shortness of breath and recurrent HCAP. Patient admitted for acute on chronic respiratory failure, initially showing improvement but required transfer to stepdown ICU for BiPAP/HFNC. Sepsis secondary to HCAP now resolved. Followed by Dr. Delton Coombes for [place]. Palliative medicine consultation for goals of care.   Assessment: Acute on chronic respiratory failure with hypoxia COPD exacerbation HCAP Non-small cell lung cancer Sepsis Deconditioning Chronic neoplastic associated pain  Recommendations/Plan:  DNR/DNI. No escalation of care.   After further [place] discussion, patient/daughter requesting comfort focused care plan and transition to hospice facility for EOL care and symptom management. Discussed with Dr. Manuella Ghazi. TOC RN/SW notified. Patient/daughter understand interventions not aimed at comfort will be discontinued when transferred to hospice facility.   Continue prn medications for symptom management.   Continue comfort feeds per patient request.  Prognosis likely one month-weeks with high risk for respiratory decompensation leading to death. Chronic respiratory failure with undelrying NSCLC not a candidate for further immunotherapy, COPD/pulmonary fibrosis on chronic oxygen, recurrent pneumonia, and declining functional/cognitive/nutritional status.    Code Status: DNR/DNI   Code Status Orders  (From admission, onward)         Start     Ordered   09/01/18 0026  Do not attempt resuscitation (DNR)  Continuous    Question Answer Comment  In the event of cardiac or respiratory ARREST Do not call a "code  blue"   In the event of cardiac or respiratory ARREST Do not perform Intubation, CPR, defibrillation or ACLS   In the event of cardiac or respiratory ARREST Use medication by any route, position, wound care, and other measures to relive pain and suffering. May use oxygen, suction and manual treatment of airway obstruction as needed for comfort.      09/01/18 0025        Code Status History    Date Active Date Inactive Code Status Order ID Comments User Context   08/03/2018 2110 08/05/2018 1731 DNR [id number]  Jani Gravel, MD ED   06/25/2018 1604 06/30/2018 1802 DNR [id number]  Murlean Iba, MD Inpatient   06/13/2018 2103 06/18/2018 2024 DNR [id number]  Bethena Roys, MD Inpatient   12/29/2017 1139 01/02/2018 0408 DNR [id number]  Murlean Iba, MD Inpatient   11/25/2017 2217 11/28/2017 1350 Full Code [id number]  Bethena Roys, MD ED   12/19/2016 1829 12/23/2016 2124 Full Code [id number]  Roxan Hockey, MD Inpatient   09/11/2016 1832 09/12/2016 1853 Full Code [id number]  Orson Eva, MD Inpatient   09/11/2016 1310 09/11/2016 1831 Full Code [id number]  Erlene Quan, PA-C ED   03/15/2016 1628 03/16/2016 1547 Full Code [id number]  Jodi Marble, MD Inpatient   Advance Care Planning Activity    Advance Directive Documentation     Most Recent Value  Type of Advance Directive  Living will, Healthcare Power of Attorney  Pre-existing out of facility DNR order (yellow form or pink MOST form)  -  "MOST" Form in Place?  -  Prognosis:   Poor prognosis likely weeks with chronic respiratory failure secondary to metastatic NSCLC, COPD, recurrent pneumonia. Declining functional and nutritional status. High risk for respiratory decompensation and death. Patient/daughter request focus on comfort and hospice facility referral.   Discharge Planning:  Hospice facility  Care plan was discussed with patient, daughter, RN, Dr Manuella Ghazi  Thank you for allowing the Palliative Medicine Team to  assist in the care of this patient.   Time In: 1130 Time Out: 1225 Total Time 55 Prolonged Time Billed no      Greater than 50%  of this time was spent counseling and coordinating care related to the above assessment and plan.  Ihor Dow, DNP, FNP-C Palliative Medicine Team  Phone: [phone] Fax: [phone]  Please contact Palliative Medicine Team phone at [phone] for questions and concerns.

## 2018-09-09 NOTE — Progress Notes (Signed)
Patient is resting in bed. Vital signs are stable. O2 on 4L/[place]. Saline lock removed. Condom Cath intact. Pain controlled with scheduled oral meds.  Report called to [place] at [facility].  Patient is awaiting EMS to transport.

## 2018-09-09 NOTE — TOC Transition Note (Signed)
Transition of Care [facility]) - CM/SW Discharge Note   Patient Details  Name: Bradley [name] MRN: [id number] Date of Birth: 06/25/1940  [facility] CM/SW Contact:  Boneta Lucks, RN Phone Number: 09/09/2018, 3:40 PM   Clinical Narrative:   [facility] called they will accept Patient. MD ok to send him today. Called Freda Munro his daughter to updated her. EMS arranged. Clinical sent in the hub to facility.                Final next level of care: [place] Barriers to Discharge: No Barriers Identified   Patient Goals and CMS Choice Patient states their goals for this hospitalization and ongoing recovery are:: to go to hospice.      Discharge Placement   [facility] [place]   Patient to be transferred to facility by: EMS Name of family member notified: Freda Munro Patient and family notified of of transfer: 09/09/18  Discharge Plan and Services     Discharge Planning Services: CM Consult Post Acute Care Choice: Home Health               Readmission Risk Interventions Readmission Risk Prevention Plan 08/05/2018 08/04/2018  Transportation Screening - Complete  Medication Review [employer]) - Complete  PCP or Specialist appointment within 3-5 days of discharge Complete -  [place] or [place] - Complete  SW Recovery Care/Counseling Consult - Complete  [place] - Complete

## 2018-09-10 DIAGNOSIS — S72009A Fracture of unspecified part of neck of unspecified femur, initial encounter for closed fracture: Secondary | ICD-10-CM | POA: Diagnosis not present

## 2018-09-10 DIAGNOSIS — J189 Pneumonia, unspecified organism: Secondary | ICD-10-CM | POA: Diagnosis not present

## 2018-09-10 DIAGNOSIS — R0602 Shortness of breath: Secondary | ICD-10-CM | POA: Diagnosis not present

## 2018-09-10 DIAGNOSIS — J438 Other emphysema: Secondary | ICD-10-CM | POA: Diagnosis not present

## 2018-09-16 DIAGNOSIS — S72009A Fracture of unspecified part of neck of unspecified femur, initial encounter for closed fracture: Secondary | ICD-10-CM | POA: Diagnosis not present

## 2018-09-16 DIAGNOSIS — J189 Pneumonia, unspecified organism: Secondary | ICD-10-CM | POA: Diagnosis not present

## 2018-09-16 DIAGNOSIS — J438 Other emphysema: Secondary | ICD-10-CM | POA: Diagnosis not present

## 2018-09-23 ENCOUNTER — Ambulatory Visit (HOSPITAL_COMMUNITY): Payer: Medicare Other

## 2018-09-23 ENCOUNTER — Ambulatory Visit (HOSPITAL_COMMUNITY): Payer: Medicare Other | Admitting: Hematology

## 2018-09-23 ENCOUNTER — Other Ambulatory Visit (HOSPITAL_COMMUNITY): Payer: Medicare Other

## 2018-09-28 DEATH — deceased

## 2020-02-18 IMAGING — CT CT ANGIOGRAPHY CHEST
2 of 6 series · 17 of 46 positions shown · IV contrast (omnipaque)
Comparison: 06/13/2018

CLINICAL DATA: History of lung cancer. Status post left
thoracentesis 2 weeks ago. Now with shortness of breath which began
2 days ago.

EXAM:
CT ANGIOGRAPHY CHEST WITH CONTRAST
TECHNIQUE: Multidetector CT imaging of the chest was performed using the
standard protocol during bolus administration of intravenous
contrast. Multiplanar CT image reconstructions and MIPs were
obtained to evaluate the vascular anatomy.
CONTRAST:  75mL OMNIPAQUE IOHEXOL 350 MG/ML SOLN

[Series 5: thins · axial · 0.66mm/px · z∈[+1136,+1468]mm · 14 of 364 slices shown]
[im 16/364  lung]
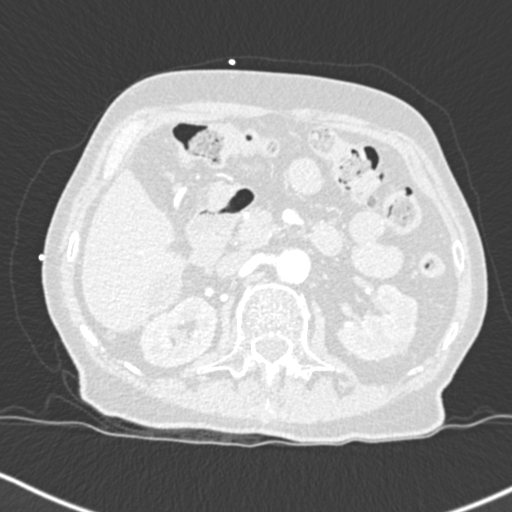
[im 48/364  soft-tissue]
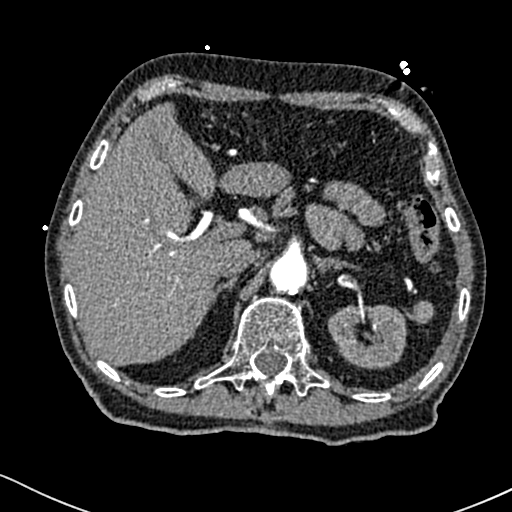
[im 64/364  lung]
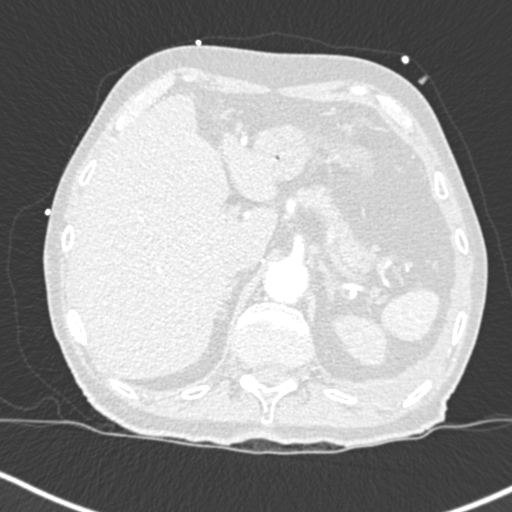
[im 95/364  soft-tissue]
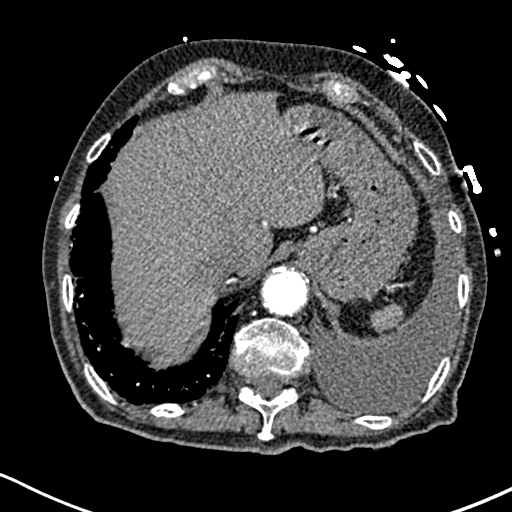
[im 127/364  lung]
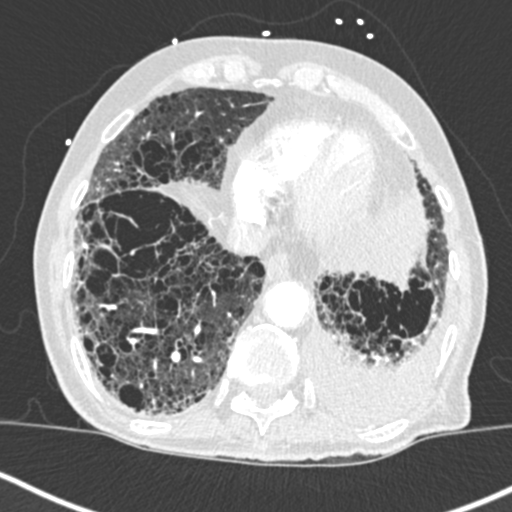
[im 143/364  soft-tissue]
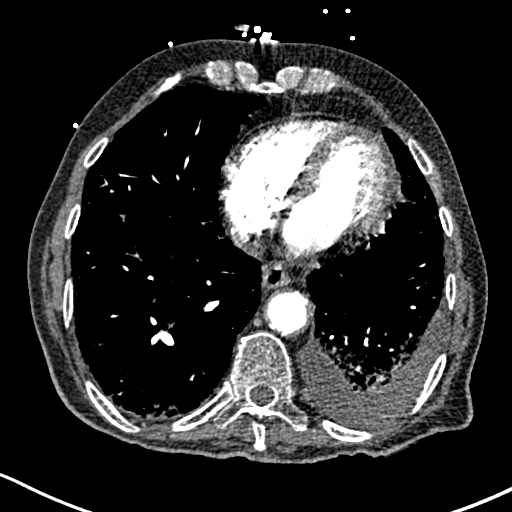
[im 174/364  lung]
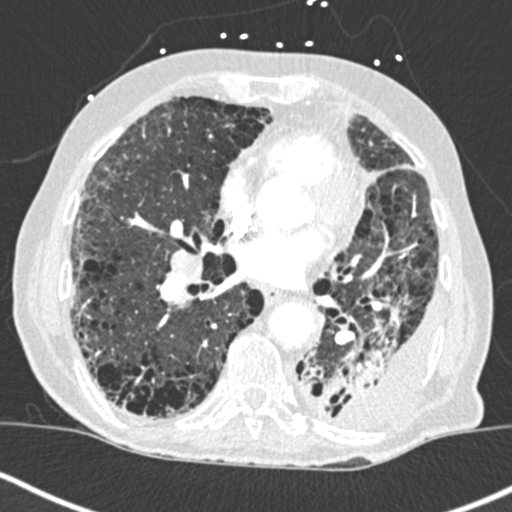
[im 190/364  soft-tissue]
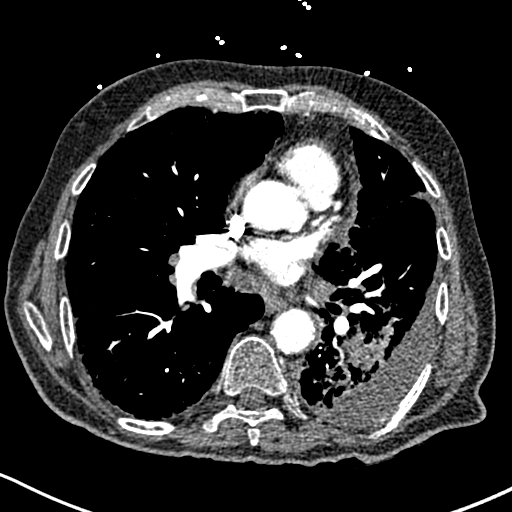
[im 221/364  lung]
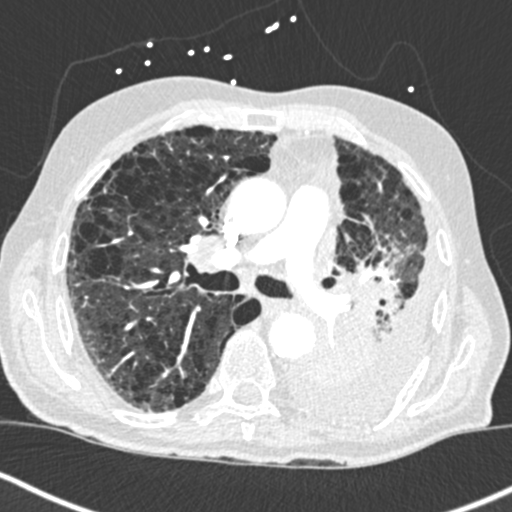
[im 237/364  soft-tissue]
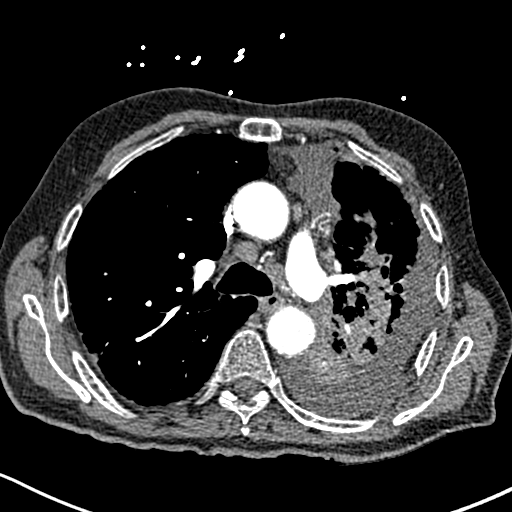
[im 269/364  lung]
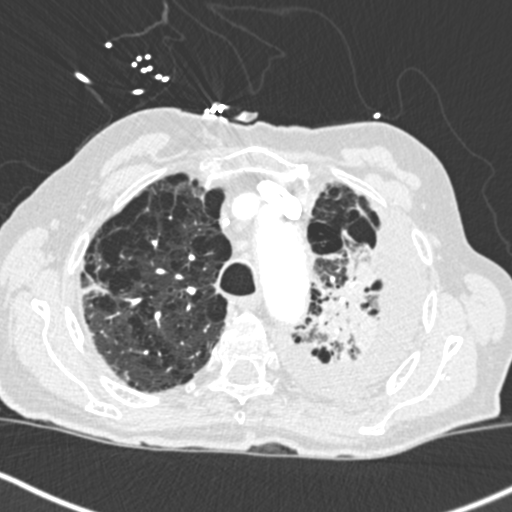
[im 300/364  soft-tissue]
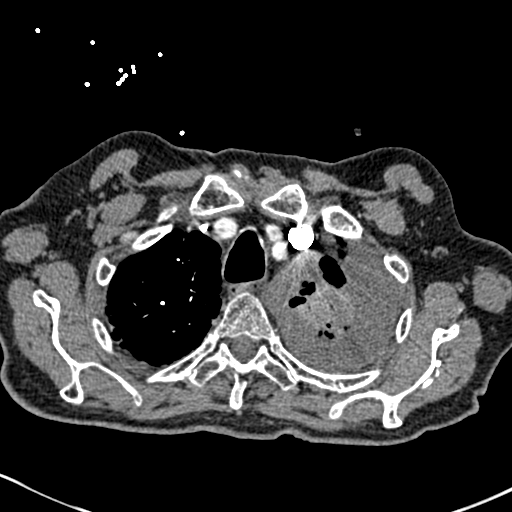
[im 316/364  lung]
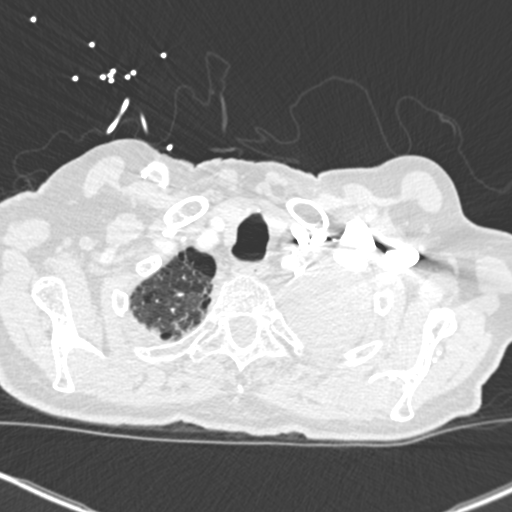
[im 348/364  soft-tissue]
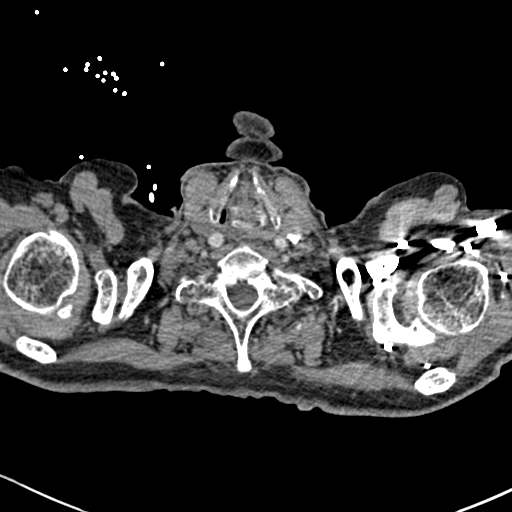

[Series 7: coronal mpr · coronal · 0.62mm/px · 3 of 136 slices shown]
[im 34/136  soft-tissue]
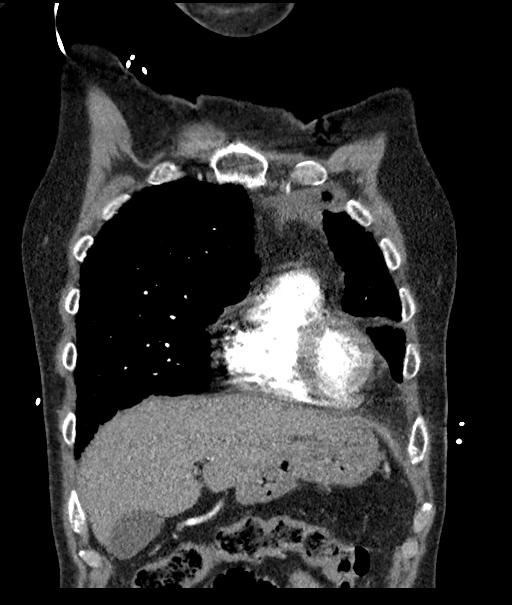
[im 68/136  soft-tissue]
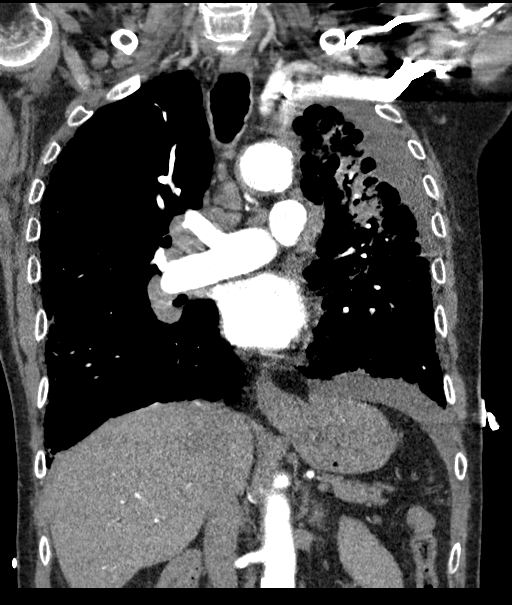
[im 102/136  soft-tissue]
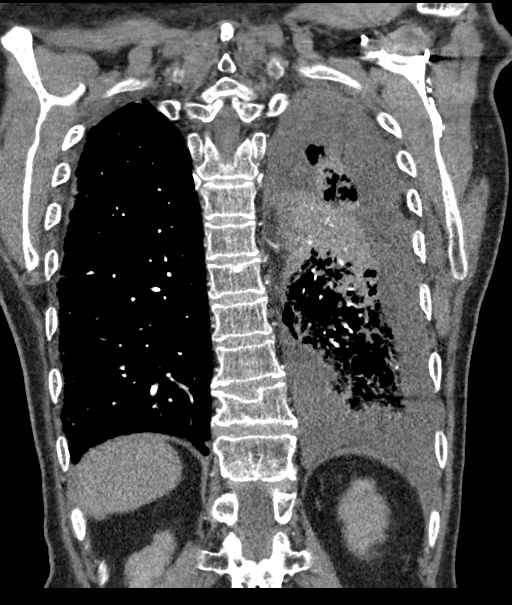

[17 of 46 positions shown; findings below may reference images not displayed]

FINDINGS: Cardiovascular: Normal heart size. There is no pericardial effusion
identified. Aortic atherosclerosis.

The main pulmonary artery appears patent. No saddle embolus. No
central obstructing pulmonary emboli identified. No lobar or
segmental pulmonary artery filling defects identified.

Mediastinum/Nodes: Normal appearance of the thyroid gland. The
trachea appears patent and is midline. Normal appearance of the
esophagus. No axillary or supraclavicular adenopathy. Hilar and
mediastinal adenopathy is again identified and is unchanged from
06/13/18.

-right paratracheal lymph node measures 1.4 cm, image 42/4.

Index right hilar lymph node is unchanged at 2.0 cm, image 51/4.

-subcarinal lymph node measures 1.3 cm, image 57/4.

-left hilar lymph node is unchanged measuring 1.9 cm.

Lungs/Pleura: Moderate volume loculated left pleural effusion is
again identified overlying the left lung. Similar in volume to
previous exam.

Advanced centrilobular and paraseptal emphysema. There is a large
area of masslike architectural distortion within the perihilar left
lung encasing the left hilar structures measuring 8.9 cm, image
139/5. Similar to previous exam. Additionally extensive airspace
densities and fibrosis extends into the left upper lobe and left
apex. Findings are favored to represent a combination of progressive
tumor, radiation change, and potentially postobstructive
pneumonitis. The right lung is clear.

Upper Abdomen: Previously noted hypermetabolic tumor within the
posteromedial right lobe of liver is again noted measuring 2.9 cm,
image 92/4. There is a subcapsular low-attenuation mass overlying
segment [DATE] measuring 4.1 cm. New from 02/15/2018 PET-CT.
Gallstones.

Musculoskeletal: No chest wall abnormality. No acute or significant
osseous findings. Unchanged T11 compression deformity.

Review of the MIP images confirms the above findings.
IMPRESSION: 1. No evidence for acute pulmonary embolus.
2. Interval reaccumulation of loculated left pleural effusion status
post thoracentesis on 06/15/2018.
3. Persistent masslike architectural distortion and airspace
consolidation within the left lung which may reflect progressive
tumor and/or postobstructive pneumonitis or pneumonia.
4. No change in bilateral hilar and mediastinal adenopathy.
5. Similar appearance of liver metastasis from 06/13/2018.
6. Aortic Atherosclerosis (AGQF9-MIT.T) and Emphysema (AGQF9-ZVT.U).

## 2020-04-25 NOTE — Progress Notes (Signed)
[facility] - Onnie Graham, MD   [address]  [place], [place] [address]  Office: [phone] Fax: [phone]              07/02/2015    JJ:OACZY JUDEA RICHES  [place], [place]  [address]  September 19, 1940    To Whom It May Concern,    Mr. Emrah Ariola has several several medical problems that make it difficult for him to perform his own housekeeping.  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      Thank you for your kind attention to this situation.        Sincerely,          Onnie Graham MD

## 2020-04-25 NOTE — Teleconsult (Signed)
Phone Note -     Patient    Call back at [name] [phone]  Initial call taken by: [name],  May 21, 2015 10:09 AM  Initial Details of Call:  Patient called asking for a refill of his oxycodone. He would like to pick up the prescription on Thursday 04/27. PMP has been printed and giving to Dr. Vianne Bulls  The [place] on-line prescription monitoring program was reviewed prior to refilling this prescription and no inappropriate controlled subtances were dispensed.      Previous Appointment:  03/16/2015  Rusty Aus MD (BP ck)    02/28/2015  Rusty Aus MD (BP ck)    02/27/2015  Onnie Graham MD (.HPI)    10/23/2014  Onnie Graham MD (.HPI)        Next Appointment:  06/19/2015  [facility]  2:00 PM  06/29/2015  [name]  8:15 AM      Last BP:  128/68   (03/16/2015)    Last HGBA1C:      ()    BMP/CMP  Last BUN:     21    (02/27/2015),                Last EGFR:     65    (02/27/2015)  Last Creatinine:     1.1    (02/27/2015),      Last Sodium:   142    (02/27/2015)       Last Potassium:     4.5    (02/27/2015)    Last  Cholesterol:     141    (02/27/2015)     Last  LDL:     77    (02/27/2015),    Last  LDL Direct:     ()    Last  SGPT ALT:     16    (02/27/2015),  Last Albumin:     4.2    (02/27/2015)    CBC  Last WBC:     13.5    (12/05/2014)  Last Hgb:     14.6    (12/05/2014)  Last Platelet Count:     467    (12/05/2014)    Last Digoxin Level:        ()    Last TSH:       (),  Last TSH Ultra:   2.42    (06/25/2012)      Last Urine Drug Screen: on 02/27/2015.  Narcotic Medications:  OXYCODONE-ACETAMINOPHEN 7.5-325 MG TABS (OXYCODONE-ACETAMINOPHEN)  - Started: 01/24/2005    OXYCODONE-ACETAMINOPHEN 7.5-325 MG TABS (OXYCODONE-ACETAMINOPHEN)  - Started: 09/21/2006    OXYCODONE-ACETAMINOPHEN 7.5-325 MG TABS (OXYCODONE-ACETAMINOPHEN)  - Started: 07/06/2009    OXYCODONE-ACETAMINOPHEN 7.5-325 MG TABS (OXYCODONE-ACETAMINOPHEN)  -  Started: 09/12/2009    OXYCODONE-ACETAMINOPHEN 7.5-325 MG TABS (OXYCODONE-ACETAMINOPHEN)  - Started: 09/12/2009  - Stopped: 10/13/2013   OXYCODONE HCL 10 MG TABS (OXYCODONE HCL)  - Started: 10/13/2013    OXYCODONE HCL 10 MG TABS (OXYCODONE HCL)  - Started: 10/13/2013        Directives/Controlled Substance Agreement 03/25/2013 PATIENT WISHES TO BE DNR  06/24/2013 HEALTH CARE PROXY IS FRIEND SUSAN MACNULTY - NO PAPERWORK ON FILE  10/13/2013 CONTROLLED SUBSTANCE AGREEMENT ON FILE [facility]    PMP Appropriate __YES

## 2020-04-25 NOTE — Teleconsult (Signed)
Phone Note -     Patient    Initial call taken by: Arther Dames,  Jun 18, 2015 10:52 AM  Initial Details of Call:  Patient calling to request refill on oxycodone-acetaminophen and is asking to pick up prescription on Thursday.      Follow-up #1  Details: The [place] on-line prescription monitoring program was reviewed prior to refilling this prescription and no inappropriate controlled subtances were dispensed.    By: Luvenia Redden LPN ~ Jun 18, 2015 11:21 AM

## 2020-04-25 NOTE — Teleconsult (Signed)
Phone Note -     Outgoing Call    Initial call taken by: Onnie Graham MD,  June 29, 2015 3:08 PM  Summary of Call: please call the patient.  His platelet count is starting to go up and I think that his iron levels may be going down.  I would like him to bring his bottle of iron pills into the office on Monday or tuesday to check and make sure that he is taking the appropriate dosing.    the rest of his lab work looks stable.      Follow-up #1  Details: Called and spoke to patient. He states since his last appt he has NOT taken any iron supplement. He states he could not find it in the store and has been trying to just eat food high in iron.  By: Luvenia Redden LPN ~ July 01, 1608 9:58 AM    Details: Patient walked into the office. He thought he was suppose to come into the office but I explained he was but to show me the iron bottle and he didnt have one to show me so he didnt need to come in. I wrote down on a piece of paper what he is suppose to take for iron and gave it to him. I asked if he was going to purchase it and he said he was. I took patient aside in a private area and asked if he was having trouble paying for his medications and he said he was. He states his prescriptions are very inexpensive through [employer] but the OTC's are adding up and he continues to be on more and more OTC's. Patient already has MVES in the home. He gets one day of cleaning. He states the housing dept is trying to get him more services. I asked patient to call and let me know if he couldnt afford the iron so we are aware and can see if there are any resources to help him. Patient verbalized understanding.  By: Luvenia Redden LPN ~ July 03, 9602 10:14 AM    Follow-up #2  Details: i have tried sending it as a RX to see if his insurance will cover it.    By: Onnie Graham MD ~ July 03, 2015 5:26 PM    Details: Left a message for patient to call the office. I called [facility] and the prescription will be $1.80 for him to  pick up using the pharmacy discount card.  By: Luvenia Redden LPN ~ July 05, 5407 9:26 AM    Follow-up #3  Details: Left 2nd message for patient to call me back.  By: Luvenia Redden LPN ~ July 05, 8117 9:05 AM    Details: Spoke with patient. He was able to pick up the iron. Patient also let me know he is moving to [place] with his daughter, He cannot afford to live here on his own anymore. Patient will come in for his last pain medication pick up on 07/19/15 and is officially moving on June 30th.  By: Luvenia Redden LPN ~ July 05, 1476 9:44 AM    Follow-up #4  Details: noted  By: Onnie Graham MD ~ July 06, 2015 12:46 PM    Prescriptions:  FERROUS SULFATE 325 MG TABS (FERROUS SULFATE) 1 tab q day  #30 x 12   Entered and Authorized by: Onnie Graham MD   Signed by: Onnie Graham MD on 07/03/2015   Method used: Electronically  to      [id number] (retail)     [address] [place], [place]  [address]     Ph: [phone]     Fax: [phone]   RxID: [id number]        Medications:  Changed medication from FERROUS SULFATE 325 MG TABS (FERROUS SULFATE) 1 by mouth daily;   buy "over the counter" to FERROUS SULFATE 325 MG TABS (FERROUS SULFATE) 1 tab q day - Signed  Rx of FERROUS SULFATE 325 MG TABS (FERROUS SULFATE) 1 tab q day;  #30 x 12;  Signed;  Entered by: Onnie Graham MD;  Authorized by: Onnie Graham MD;  Method used: Electronically to [id number] [address] [place], [place], [place]  [address], Ph: [phone], Fax: [phone]

## 2020-04-25 NOTE — Teleconsult (Signed)
Phone Note -       Initial call taken by: Arther Dames,  July 16, 2015 10:05 AM  Initial Details of Call:  Patient calling to request refill on Percocet and is asking to pick up prescription on Thursday morning.      Follow-up #1  Details: The [place] on-line prescription monitoring program was reviewed prior to refilling this prescription and no inappropriate controlled subtances were dispensed.    By: Luvenia Redden LPN ~ July 16, 2015 10:31 AM    Prescriptions:  OXYCODONE HCL 10 MG TABS (OXYCODONE HCL) 1 tab every 8 hours for severe pain. partial fill is available.  #84 x 0   Entered and Authorized by: Onnie Graham MD   Signed by: Onnie Graham MD on 07/19/2015   Method used: Print then Give to Patient   RxID: [id number]        Medications:  Rx of OXYCODONE HCL 10 MG TABS (OXYCODONE HCL) 1 tab every 8 hours for severe pain. partial fill is available.;  #84 x 0;  Signed;  Entered by: Onnie Graham MD;  Authorized by: Onnie Graham MD;  Method used: Print then Give to Patient

## 2020-05-02 NOTE — Progress Notes (Signed)
he had cardiology consult for increase chest pain episodes  neg stress test and echo  i increase his cardizem for bp  Did you self refer to any specialists?    Age: 80 Years Old  Last Secure Message:  07/09/2011 - Secure Messaging: Email: [facility] OFFIE NOTE  Last PIN change:           Labs   No eye exam in record! LDL 77 on 01/31/2017HGBA1C =       Most Recent Visits     (Last rx/phone contact: 06/28/2015 )        03/16/2015  Rusty Aus MD (BP ck)    02/28/2015  Rusty Aus MD (BP ck)    02/27/2015  Onnie Graham MD (.HPI)    10/23/2014  Onnie Graham MD (.HPI)                                      Next Appointment(s) 06/29/2015  [facility]  8:15 AM      Immunizations  No flu vaccine this season      PCV13    03/25/2013      Td Booster 03/20/2008 Adacel/TDAP 10/25/2012  Last Flu  10/23/2014    Pneumovax  09/28/1995  12/18/2011    TD:   Adacel on 10/25/2012           Bone Density:  Date of Exam:    Colonoscopy: divertics. Date of Exam:  12/17/2006.  FIT Test:  Date:  .  Hemoccult: Negative Date:  06/24/2013.    Protocols Due  ZOSTAVAX.           Directives 03/25/2013 PATIENT WISHES TO BE DNR  06/24/2013 HEALTH CARE PROXY IS FRIEND SUSAN MACNULTY - NO PAPERWORK ON FILE  10/13/2013 CONTROLLED SUBSTANCE AGREEMENT ON FILE [facility]    The above represents the pre-visit preparations conducted for this patient.

## 2020-05-02 NOTE — Progress Notes (Signed)
Primary Provider:  Onnie Graham MD      History of Present Illness:  the patient is here for annual comprehensive visit, including management and coordination of care of the problems below:    we reviewed self referrals to any new specialists: none.    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  so he reduced his cardizem back to 240mg .      Current Allergies:  1)  TAGAMET (Severe)  2)  * SPIRIVA (Moderate)  3)  * LOVASTATIN (Moderate)    Current Meds:    CARDIZEM CD 240 MG XR24H-CAP (DILTIAZEM HCL COATED BEADS) 1 tab q am for bp  METOPROLOL SUCCINATE 25 MG XR24H-TAB (METOPROLOL SUCCINATE) take one by mouth each day  SIMVASTATIN 10 MG TABS (SIMVASTATIN) 1 po once daily in evening for cholesterol  FERROUS SULFATE 325 MG TABS (FERROUS SULFATE) 1 by mouth daily;   buy "over the counter"  OXYCODONE HCL 10 MG TABS (OXYCODONE HCL) 1 tab every 8 hours for severe pain. partial fill is available.  GABAPENTIN 100 MG CAPS (GABAPENTIN) 2 tab two times daily to reduce pain  ISOSORBIDE MONONITRATE 20 MG TABS (ISOSORBIDE MONONITRATE) 1/2 tab in am and repeat after 8 hours two times daily  METOCLOPRAMIDE HCL 5 MG  TABS (METOCLOPRAMIDE HCL) 1 tab  before meals and at bedtime  OMEPRAZOLE 20 MG CPDR (OMEPRAZOLE) 1 tab two times daily for acid OTC  NORTRIPTYLINE HCL 10 MG CAPS (NORTRIPTYLINE HCL) 3 tab hs to reduce nerve pain from back  ASPIRIN 81 MG TBEC (ASPIRIN) take one by mouth daily  ONE-A-DAY MENS TABS (MULTIPLE VITAMIN)   NITROGLYCERIN 0.3 MG SUBL (NITROGLYCERIN) 1 po prn chest pain, repeat q5 minutes x2 for total of 3 doses prn  * CONTROLLED SUBSTANCE AGREEMENT ON FILE   FLUOCINONIDE 0.05 % CREA (FLUOCINONIDE) apply to rash on arms two times daily for 10 days    he drives does some errands most days.  he might be walking around in each store for ~ 15 minutes.  for total of 45 minutes of shopping  no other sustained walking.  limited by back pain and then short of breath.    no special diet.      Current Directives- Reviewed during today's visit  PATIENT WISHES TO BE DNR  HEALTH CARE PROXY IS FRIEND SUSAN MACNULTY - NO PAPERWORK ON FILE  CONTROLLED SUBSTANCE AGREEMENT ON FILE KD    Past Medical History  atypical chest pain most likely due to esophageal spasm.  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      Surgical History  bilat cataracts: 8/14  arthroscopic surgery left knee- dr Frederick Peers 12/09, 9/10, 9/11, 1/13  tonsils and appendix  several fatty growths removed from back and left arm by dr Freida Busman  4 epidural steroid injections 2006/2007  Social History  retired from remodeling carpentry  lives alone in apartment.   he has a lady friend at the lodge  rustproofer of autos.  no special diet  wife died unexpectedly Jan 26, 2023  he is estranged from his son  active at the [employer]    Risk Factors  Smoking Status:current daily smoker  Type: cigarettes   Smoking Comments: tobacco consumption depends on how much pain or how nervous he is  Amount: 1/2 pack a day  Counseled to quit/cut down: Yes  Passive smoke exposure:  Yes    Drug use: no  Alcohol use: previous  Alcohol Use Comments: he remains alcohol free since 1995    HIV high risk behavior: no  Family History MI in male age < 78: yes  Last Colonoscopy:   divertics. (12/17/2006 7:50:18 AM)        Review of Systems   General: rates energy as 8/10.  knee pain sometimes wakes him at night when he rolls over.    Eyes: Denies visual change or blurring, double vision. last eye exam < 2 years.    Ears/Nose/Throat: Denies ringing in the ears, earache.   Cardiovascular: Complains of shortness of breath. Denies palpitations. he gets chest pain with 20 minutes of activity.  he typically rests and goes away in 20-30 minutes.  rarely using NTG  no edema  Respiratory: Denies wheezing. morning cough with clear phlegm  Gastrointestinal: Denies nausea, vomiting, diarrhea, abdominal pain, abdominal bloating, constipation, mucous or blood in stools.   Genitourinary: Denies decreased urinary stream, urinary hesitancy. nocturia x 2  Musculoskeletal: knee brace helps him when he is walking.  due to instability  Skin: Denies rash.   Neurologic: Complains of parasthesias. Denies headaches, seizures, transient weakness, general weakness. numbness along outter aspect of left knee  he had 3 near falls in may due to knee instability.    Psychiatric: Denies anxiety, depression.   Endocrine: Denies flushing, polyuria, polydipsia.   Heme/Lymphatic: Denies easy bruising, unusual bleeding.     All other pertinent systems were reviewed and are negative   Vital Signs     Weight: 169.20 lb. Height: 72  in.    BMI: 23.03  BSA: 1.99    Wt chg: -8.80  Weight: 178 lbs   BMI: 24.23 on 03/16/2015  Temperature: 97.81 deg F.     Temp Site: oral    Pulse rate: 84    Pulse rhythm: regular  Pulse Ox (SpO2): 96  BP: 120/84, sitting left arm      Medications and Allergies Reviewed    Signed: Kristen D'Apolito LPN....June 29, 2015 8:07 AM  PHQ 2    Over the last 2 weeks, how often have you been bothered by any of the  following problems?  1. Little interest or pleasure in doing things:  0   - Not at all  2. Feeling down, depressed, or hopeless:  0   - Not at all        Physical Exam    General:      well developed, well nourished, in no acute distress.    Additional Exam:      Skin- some actinic changes on the forehead and hands.  TMs-normal bilaterally  Oropharynx-mild posterior injection without lesion, erythema or exudate  Neck-no bruit, adenopathy, or palpable thyroid abnormality.  Lungs-decreased breath sounds throughout, without wheeze, rales, or signs of consolidation.  Cor- heart rhythm is regular.  no  click, murmur, S3, or S4 appreciated.  Abdomen-normal bowel sounds present.  No mass, tenderness, or bruit appreciated.  Rectal-without palpable mass.  Stool is guaiac-negative.  Prostate-with no palpable enlargement.  There are no palpable masses.  Genital-there is no scrotal mass or hernia.  Extremities-there is no peripheral edema.  There is no discoloration.  Joints-there is mild deformity of both knees.  Neuro-there are 1+ DTRs in the upper extremities and I could not elicit DTRs in the lower extremities.  Patient is oriented x 3. there is some short-term memory deficits.  Psych-patient has appropriate mood and affect.  Patient answers all questions appropriately.    Test Results:     WBC:     13.5    (12/05/2014)  Hemoglobin:    14.6    (12/05/2014)  Hematocrit:    43.3    (12/05/2014)  PLatelets:    467    (12/05/2014)  TIBC:     252    (12/05/2014)  Ferritin:    96    (12/05/2014)  AST:     18    (02/27/2015)  ALT:     16    (02/27/2015)  LDL:     77    (02/27/2015)  HDL:     46    (02/27/2015)  Glucose:    97    (02/27/2015)  BUN:     21    (02/27/2015)  Creatinine:    1.1    (02/27/2015)  PSA:     0.56    (06/24/2013)       Assessment and Plan:        ~ CHEST PAIN, ATYPICAL (R07.89)   COPD (J44.9)   HIGH CHOLESTEROL (E78.70)   SYSTOLIC HYPERTENSION (I10)   THROMBOCYTOSIS (D47.3)   ABUSE, ALCOHOL, IN REMISSION  (F10.21)    Patient had workup several months ago with cardiology for an escalating pattern of exertional chest discomfort.  Pharmacologic stress test was unremarkable, and echocardiogram was normal.  His systolic blood pressure was elevated so cardiologist put him on a low dose of metoprolol succinate and after that he was noticing that he was feeling dizzy about an hour after his morning blood pressure meds so he self selected bback to Cardizem 240 mg a day.  Blood pressure is under good control at his visit today.  Lipids have been at goal with normal liver blood work.  Activities are limited first by back pain and then by some dyspnea on exertion.  He has some chronic thrombocytosis and had borderline iron stores so he has been on iron supplement with iron levels monitored every 6 months.  He remains on oxycodone for long-term management of chronic degenerative disc disease pain in the back and chronic pain in the left knee.  Alcohol abuse remains in remission.  No progress in smoking cessation or reduction and no motivation towards this goal.    He is requesting some help at home to manage his housekeeping as he did not meet some standard within the housing for cleanliness.  He is asking for a letter to say that he has medical conditions that limited his ability to keep his house kept up.  I am noticing a little bit of short-term memory issue having very little recollection her detail about visits to specialists including the cardiologist, ophthalmologist and hematologist.  I'm suspecting that there is some early dementia here.we contacted both his eye Dr. Larena Sox is dermatologist to try to get him updated appointments but apparently he  is in collections for nonpayment.  He promises to resolve this and schedule these appointments.  no exacerbations of COPD over this interval.    His lab work is up-to-date.  I made no specific changes to his regimen today  patient does not require any additional self managment  tools to manage chronic health conditions.  I would see him routinely in 3 months.    Problems Reviewed  Med Compliance and SE's: Pt is compliant with meds with no side effects   Patient/Caregiver understand instructions and plan.    Medications Removed Today:   CARDIZEM CD 360 MG XR24H-CAP (DILTIAZEM HCL COATED BEADS) 1 tab q am for bp    New/Revised  Medications Today:   CARDIZEM CD 240 MG XR24H-CAP (DILTIAZEM HCL COATED BEADS) 1 tab q am for bp        Patient Instructions    Return for follow-up in 3 month(s) follow-up 30.      THE STAFF WILL BE MAKING SOME APPOINTMENTS FOR YOU:  eye exam   [facility] dermatology- actinic keratoses    THE STAFF WILL BE BOOKING SOME TESTING FOR YOU:  labs today    WE HAVE MADE NO CHANGES IN YOUR MEDICATIONS:    WE DISCUSSED SOME STRATEGIES AND GOALS TO IMPROVE OR MAINTAIN YOUR OVERALL HEALTH:    The goals for lowering blood pressure are meant to reduce your future risk for stroke and heart disease. Your personal blood pressure goal is less than: 138/88.  is this goal being met: yes    the goal of treating cholesterol is to reduce your LDL or bad cholesterol to a level that has been shown to reduce risk of heart disease and stroke.   your LDL goal is < 100 mg/dl.  is this goal being met: yes        We have discussed some possible factors that may interfere with your full success with your goals: continued smoking

## 2020-05-02 NOTE — Progress Notes (Signed)
Unable to schedule appointment at [facility]. Patient is in collections and will need to contact Lupita Leash before appointment can be scheduled.  Unable to schedule appointment at [facility]. Patient is in collections and will need to contact Marcelino Duster at [phone] before appointment can be scheduled.  Patient is aware and was provided with this contact information.
